# Patient Record
Sex: Male | Born: 1945
Health system: Southern US, Community
[De-identification: ages and names within clinical notes are randomized; demographics above are authoritative.]

## PROBLEM LIST (undated history)

## (undated) DIAGNOSIS — E669 Obesity, unspecified: Secondary | ICD-10-CM

## (undated) DIAGNOSIS — H2513 Age-related nuclear cataract, bilateral: Secondary | ICD-10-CM

## (undated) DIAGNOSIS — H25013 Cortical age-related cataract, bilateral: Secondary | ICD-10-CM

## (undated) DIAGNOSIS — E291 Testicular hypofunction: Secondary | ICD-10-CM

## (undated) DIAGNOSIS — M818 Other osteoporosis without current pathological fracture: Secondary | ICD-10-CM

## (undated) DIAGNOSIS — H52203 Unspecified astigmatism, bilateral: Secondary | ICD-10-CM

## (undated) DIAGNOSIS — G4730 Sleep apnea, unspecified: Secondary | ICD-10-CM

## (undated) DIAGNOSIS — Z87442 Personal history of urinary calculi: Secondary | ICD-10-CM

## (undated) DIAGNOSIS — C61 Malignant neoplasm of prostate: Secondary | ICD-10-CM

## (undated) DIAGNOSIS — I2510 Atherosclerotic heart disease of native coronary artery without angina pectoris: Secondary | ICD-10-CM

## (undated) DIAGNOSIS — IMO0001 Reserved for inherently not codable concepts without codable children: Secondary | ICD-10-CM

## (undated) DIAGNOSIS — C679 Malignant neoplasm of bladder, unspecified: Secondary | ICD-10-CM

## (undated) DIAGNOSIS — R5383 Other fatigue: Secondary | ICD-10-CM

## (undated) DIAGNOSIS — I259 Chronic ischemic heart disease, unspecified: Secondary | ICD-10-CM

## (undated) DIAGNOSIS — E7800 Pure hypercholesterolemia, unspecified: Secondary | ICD-10-CM

## (undated) DIAGNOSIS — E559 Vitamin D deficiency, unspecified: Secondary | ICD-10-CM

## (undated) DIAGNOSIS — J40 Bronchitis, not specified as acute or chronic: Secondary | ICD-10-CM

## (undated) DIAGNOSIS — T50905A Adverse effect of unspecified drugs, medicaments and biological substances, initial encounter: Secondary | ICD-10-CM

## (undated) DIAGNOSIS — E119 Type 2 diabetes mellitus without complications: Secondary | ICD-10-CM

## (undated) DIAGNOSIS — K760 Fatty (change of) liver, not elsewhere classified: Secondary | ICD-10-CM

## (undated) DIAGNOSIS — E785 Hyperlipidemia, unspecified: Secondary | ICD-10-CM

## (undated) HISTORY — DX: Cortical age-related cataract, bilateral: H25.013

## (undated) HISTORY — DX: Other osteoporosis without current pathological fracture: M81.8

## (undated) HISTORY — DX: Other fatigue: R53.83

## (undated) HISTORY — DX: Chronic ischemic heart disease, unspecified: I25.9

## (undated) HISTORY — DX: Reserved for inherently not codable concepts without codable children: IMO0001

## (undated) HISTORY — DX: Fatty (change of) liver, not elsewhere classified: K76.0

## (undated) HISTORY — DX: Obesity, unspecified: E66.9

## (undated) HISTORY — DX: Bronchitis, not specified as acute or chronic: J40

## (undated) HISTORY — PX: OTHER SURGICAL HISTORY: SHX169

## (undated) HISTORY — DX: Age-related nuclear cataract, bilateral: H25.13

## (undated) HISTORY — DX: Hyperlipidemia, unspecified: E78.5

## (undated) HISTORY — DX: Sleep apnea, unspecified: G47.30

## (undated) HISTORY — DX: Testicular hypofunction: E29.1

## (undated) HISTORY — DX: Unspecified astigmatism, bilateral: H52.203

## (undated) HISTORY — DX: Pure hypercholesterolemia, unspecified: E78.00

## (undated) HISTORY — DX: Malignant neoplasm of prostate: C61

## (undated) HISTORY — DX: Adverse effect of unspecified drugs, medicaments and biological substances, initial encounter: T50.905A

## (undated) HISTORY — DX: Vitamin D deficiency, unspecified: E55.9

---

## 1999-04-01 HISTORY — PX: CARDIAC CATHETERIZATION: SHX172

## 1999-04-14 ENCOUNTER — Inpatient Hospital Stay (HOSPITAL_COMMUNITY): Admission: EM | Admit: 1999-04-14 | Discharge: 1999-04-16 | Payer: Self-pay | Admitting: Emergency Medicine

## 2000-08-21 ENCOUNTER — Encounter: Payer: Self-pay | Admitting: Cardiology

## 2000-08-21 ENCOUNTER — Observation Stay (HOSPITAL_COMMUNITY): Admission: AD | Admit: 2000-08-21 | Discharge: 2000-08-22 | Payer: Self-pay | Admitting: Cardiology

## 2003-12-05 ENCOUNTER — Emergency Department (HOSPITAL_COMMUNITY): Admission: EM | Admit: 2003-12-05 | Discharge: 2003-12-05 | Payer: Self-pay | Admitting: Emergency Medicine

## 2003-12-07 ENCOUNTER — Encounter: Admission: RE | Admit: 2003-12-07 | Discharge: 2003-12-07 | Payer: Self-pay | Admitting: Internal Medicine

## 2004-10-27 ENCOUNTER — Ambulatory Visit (HOSPITAL_COMMUNITY): Admission: RE | Admit: 2004-10-27 | Discharge: 2004-10-27 | Payer: Self-pay | Admitting: Gastroenterology

## 2004-10-27 LAB — HM COLONOSCOPY: HM Colonoscopy: NORMAL

## 2006-11-26 ENCOUNTER — Ambulatory Visit: Payer: Self-pay | Admitting: Family Medicine

## 2006-11-28 ENCOUNTER — Ambulatory Visit: Payer: Self-pay | Admitting: Family Medicine

## 2006-12-03 ENCOUNTER — Ambulatory Visit: Payer: Self-pay | Admitting: Family Medicine

## 2007-01-07 ENCOUNTER — Ambulatory Visit: Payer: Self-pay | Admitting: Family Medicine

## 2007-02-20 ENCOUNTER — Ambulatory Visit: Payer: Self-pay | Admitting: Family Medicine

## 2007-03-27 ENCOUNTER — Ambulatory Visit: Payer: Self-pay | Admitting: Family Medicine

## 2007-10-01 ENCOUNTER — Ambulatory Visit: Payer: Self-pay | Admitting: Family Medicine

## 2007-10-08 ENCOUNTER — Ambulatory Visit: Payer: Self-pay | Admitting: Family Medicine

## 2007-10-30 ENCOUNTER — Ambulatory Visit (HOSPITAL_COMMUNITY): Admission: RE | Admit: 2007-10-30 | Discharge: 2007-10-30 | Payer: Self-pay | Admitting: Urology

## 2007-11-11 ENCOUNTER — Ambulatory Visit: Admission: RE | Admit: 2007-11-11 | Discharge: 2007-12-31 | Payer: Self-pay | Admitting: Radiation Oncology

## 2008-01-01 ENCOUNTER — Ambulatory Visit: Admission: RE | Admit: 2008-01-01 | Discharge: 2008-03-04 | Payer: Self-pay | Admitting: Radiation Oncology

## 2008-01-01 HISTORY — PX: RADIOACTIVE SEED IMPLANT: SHX5150

## 2008-02-23 ENCOUNTER — Encounter: Admission: RE | Admit: 2008-02-23 | Discharge: 2008-02-23 | Payer: Self-pay | Admitting: Urology

## 2008-02-23 ENCOUNTER — Ambulatory Visit: Admission: RE | Admit: 2008-02-23 | Discharge: 2008-02-23 | Payer: Self-pay | Admitting: Radiation Oncology

## 2008-03-29 ENCOUNTER — Ambulatory Visit (HOSPITAL_BASED_OUTPATIENT_CLINIC_OR_DEPARTMENT_OTHER): Admission: RE | Admit: 2008-03-29 | Discharge: 2008-03-29 | Payer: Self-pay | Admitting: Urology

## 2008-04-16 ENCOUNTER — Ambulatory Visit: Admission: RE | Admit: 2008-04-16 | Discharge: 2008-05-13 | Payer: Self-pay | Admitting: Radiation Oncology

## 2008-05-04 ENCOUNTER — Ambulatory Visit: Payer: Self-pay | Admitting: Family Medicine

## 2008-06-07 ENCOUNTER — Ambulatory Visit: Payer: Self-pay | Admitting: Family Medicine

## 2008-06-11 ENCOUNTER — Encounter: Admission: RE | Admit: 2008-06-11 | Discharge: 2008-06-11 | Payer: Self-pay | Admitting: Family Medicine

## 2008-06-16 ENCOUNTER — Ambulatory Visit: Payer: Self-pay | Admitting: Family Medicine

## 2009-02-21 ENCOUNTER — Ambulatory Visit: Payer: Self-pay | Admitting: Family Medicine

## 2009-02-22 ENCOUNTER — Ambulatory Visit: Payer: Self-pay | Admitting: Family Medicine

## 2009-06-22 ENCOUNTER — Ambulatory Visit: Payer: Self-pay | Admitting: Family Medicine

## 2009-06-28 ENCOUNTER — Encounter: Admission: RE | Admit: 2009-06-28 | Discharge: 2009-06-29 | Payer: Self-pay | Admitting: Family Medicine

## 2009-06-28 ENCOUNTER — Encounter: Admission: RE | Admit: 2009-06-28 | Discharge: 2009-09-26 | Payer: Self-pay | Admitting: Family Medicine

## 2010-01-11 ENCOUNTER — Ambulatory Visit: Payer: Self-pay | Admitting: Family Medicine

## 2010-02-28 DIAGNOSIS — IMO0001 Reserved for inherently not codable concepts without codable children: Secondary | ICD-10-CM

## 2010-02-28 HISTORY — DX: Reserved for inherently not codable concepts without codable children: IMO0001

## 2010-03-20 ENCOUNTER — Ambulatory Visit: Payer: Self-pay | Admitting: Family Medicine

## 2010-09-19 ENCOUNTER — Ambulatory Visit: Payer: Self-pay | Admitting: Cardiology

## 2010-09-21 ENCOUNTER — Encounter: Admission: RE | Admit: 2010-09-21 | Discharge: 2010-09-21 | Payer: Self-pay | Admitting: Cardiology

## 2010-09-27 ENCOUNTER — Ambulatory Visit: Payer: Self-pay | Admitting: Cardiology

## 2010-10-16 ENCOUNTER — Ambulatory Visit: Payer: Self-pay | Admitting: Family Medicine

## 2010-11-08 ENCOUNTER — Ambulatory Visit: Payer: Self-pay | Admitting: Cardiovascular Disease

## 2010-11-27 ENCOUNTER — Ambulatory Visit: Payer: Self-pay | Admitting: Family Medicine

## 2010-12-13 ENCOUNTER — Ambulatory Visit: Payer: Self-pay | Admitting: Cardiology

## 2011-01-11 ENCOUNTER — Ambulatory Visit
Admission: RE | Admit: 2011-01-11 | Discharge: 2011-01-11 | Payer: Self-pay | Source: Home / Self Care | Attending: Family Medicine | Admitting: Family Medicine

## 2011-03-09 ENCOUNTER — Ambulatory Visit (INDEPENDENT_AMBULATORY_CARE_PROVIDER_SITE_OTHER): Payer: BC Managed Care – PPO | Admitting: Nurse Practitioner

## 2011-03-09 DIAGNOSIS — I2510 Atherosclerotic heart disease of native coronary artery without angina pectoris: Secondary | ICD-10-CM

## 2011-03-09 DIAGNOSIS — E669 Obesity, unspecified: Secondary | ICD-10-CM

## 2011-03-13 ENCOUNTER — Other Ambulatory Visit (INDEPENDENT_AMBULATORY_CARE_PROVIDER_SITE_OTHER): Payer: BC Managed Care – PPO

## 2011-03-13 DIAGNOSIS — E785 Hyperlipidemia, unspecified: Secondary | ICD-10-CM

## 2011-05-15 NOTE — Op Note (Signed)
[name], [name]              ACCOUNT NO.:  [id number]   MEDICAL RECORD NO.:  [id number]          PATIENT TYPE:  AMB   LOCATION:  [facility]                         FACILITY:  [facility]   PHYSICIAN:  Heloise Purpura, MD      DATE OF BIRTH:  April 24, 1946   DATE OF PROCEDURE:  03/29/2008  DATE OF DISCHARGE:                               OPERATIVE REPORT   PREOPERATIVE DIAGNOSIS:  Prostate cancer.   POSTOPERATIVE DIAGNOSIS:  Prostate cancer.   PROCEDURES:  1. Transperineal placement of radiation seeds into the prostate.  2. Cystoscopy.   SURGEONS:  Dr. Heloise Purpura and Dr. Margaretmary Dys.   ANESTHESIA:  General.   COMPLICATIONS:  None.   ESTIMATED BLOOD LOSS:  Minimal.   INDICATION:  Mr. Jefferys is a 65 year old gentleman with a high risk  clinically localized prostate cancer.  He underwent metastatic  evaluation which was negative.  After discussion regarding management  options for treatment, he elected to proceed with a combination of  radiation therapy and androgen deprivation.  He has completed five weeks  of external beam radiation therapy and is now set to undergo radiation  seed implantation to the prostate for a prostate boost.  Potential  risks, complications, and alternative options were discussed with the  patient and he agreed to proceed to the above procedures.  Informed  consent was obtained.   DESCRIPTION OF PROCEDURE:  The patient was taken to the operating room  and general anesthetic was administered.  He was given preoperative  antibiotics, placed in the dorsal lithotomy position, and his perineum  was prepped and draped in the usual sterile fashion.  A Foley catheter  and rectal catheter were then placed.  Under transrectal ultrasound  guidance, the prostate was visualized.  He intraoperative planning was  performed with the Nucletron system per Dr. Kathrynn Running.  Once planning was  complete, a total of 45 seeds were placed using 17 needles for placement  of iodine  125 seeds according to the plan.  All seeds were placed with  the aid of the Nucletron system and were free seeds.  A treatment dose  was 110 gray.  Once the seeds were appropriately placed, the patient's  genitalia was then prepped and draped in the usual sterile fashion and  flexible cystourethroscopy was performed.  This demonstrated no seeds  within the urethra.  The bladder was noted to be mildly trabeculated  with multiple small  diverticula.  There were no seeds noted within the bladder.  The  cystoscope was then removed and a 16-French Foley catheter was then  replaced.  The patient appeared to tolerate procedure well without  complications.  He was able to be awakened and transferred to recovery  unit in satisfactory condition.      Heloise Purpura, MD  Electronically Signed     LB/MEDQ  D:  03/29/2008  T:  03/30/2008  Job:  [id number]

## 2011-05-18 NOTE — H&P (Signed)
[place]. [facility]  Patient:    Jimmy [name], Jimmy [name]                       MRN: [id number] Adm. Date:  08/21/00 Attending:  Colleen Can. Deborah Chalk, M.D. Dictator:   Jennet Maduro. Earl Gala, R.N., A.N.P. CC:         Thora Lance, M.D.   History and Physical  CHIEF COMPLAINT:  Chest pain.  HISTORY OF PRESENT ILLNESS:  Jimmy [name] is a pleasant 65 year old white male who presents to our office today after having had two distinct episodes of chest discomfort, which basically occurred at rest.  It has been responsive to nitroglycerin.  He notes that he had a complete physical examination yesterday with his primary care Shabree Tebbetts, which was unremarkable.  Prior to that time he had been doing quite well, with no complaints.  Today while traveling to [place], he had an episode of feeling pale and clammy with midsternal chest discomfort.  It lasted for approximately 15-20 minutes.  He took nitroglycerin x 1, with relief.  While coming back, he had a recurrence that was also associated with some palpitations and heart racing.  He took nitroglycerin x 2, with subsequent relief, and was subsequently brought here for further evaluation.  PAST MEDICAL HISTORY: 1. Prior history of chest pain.  He is status post cardiac catheterization in    April 2000, which showed normal left ventricular function with mild    coronary atherosclerosis, but with a small delay in contrast focally in    the right coronary artery.  It was most suggestive of a dissection plane.    He was treated medically at that time.  He has had treadmill testing    performed in June 2001, in which he exercised for a total of 10 minutes,    to a maximum of 4.2 miles per hour at 16% grade.  His blood pressure    response was adequate.  The test was clinically negative, as well as    electrographically negative. 2. Prior tobacco abuse. 3. Hypercholesterolemia. 4. Obesity.  ALLERGIES:  Questionable rash with  PLAVIX.  CURRENT MEDICATIONS: 1. Niaspan 500 mg q.d. 2. Zocor 40 mg q.d. 3. Aspirin q.d.  FAMILY HISTORY:  Father is living in his late 48s.  There is a history of chronic obstructive pulmonary disease and diabetes mellitus.  Mother died when the patient was 23 years old, secondary to a motor vehicle accident.  SOCIAL HISTORY:  He is married, with two children.  He previously smoked one pack a day.  He engages in occasional alcohol use.  REVIEW OF SYSTEMS:  Basically as noted above.  Otherwise unremarkable.  PHYSICAL EXAMINATION:  GENERAL:  He is currently in no acute distress.  He is pain-free.  VITAL SIGNS:  Blood pressure 120/70, heart rate 76, respirations 18.  SKIN:  Warm and dry.  Color unremarkable.  NECK:  Supple, no jugular venous distention.  LUNGS:  Basically clear.  CARDIAC:  A regular rhythm.  ABDOMEN:  Soft, positive bowel sounds, nontender.  It is obese.  EXTREMITIES:  Somewhat fleshy, but no overt edema.  LABORATORY DATA:  Currently pending.  His 12-lead electrocardiogram here in the office shows no acute changes.  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  PLAN:  He will be admitted.  Will follow his labs.  Will add topical nitrates and Lovenox.  DD:  08/21/00 TD:  08/21/00 Job: [id number]

## 2011-05-18 NOTE — Discharge Summary (Signed)
[place]. [facility]  Patient:    Jimmy [name], Jimmy [name]                     MRN: [id number] Adm. Date:  [id number] Disc. Date: [id number] Attending:  Eleanora Neighbor Dictator:   Jennet Maduro. Earl Gala, R.N., A.N.P. CC:         Thora Lance, M.D.   Discharge Summary  PRIMARY DISCHARGE DIAGNOSIS:  Two episodes of chest discomfort, with subsequent negative cardiac enzymes and negative ECG.  SECONDARY DIAGNOSES: 1. Previous episode of chest pain in April of 2000 with cardiac    catheterization revealing probable dissection of the right coronary artery.    He was treated medically. 2. Prior tobacco abuse. 3. Hypercholesterolemia, currently on Niaspan and Zocor. 4. Obesity.  HISTORY OF PRESENT ILLNESS:  Jimmy [name] is a 65 year old white male, who presented to our office on August 21, 2000, after having two episodes of chest discomfort that basically occurred at rest.  They were responsive to nitroglycerin.  They were associated with pallor, diaphoresis.  He was subsequently referred to the office where he was sent to the hospital for further evaluation.  Please see the dictated history and physical for further patient presentation and profile.  LABORATORY DATA ON ADMISSION:  His 12-lead electrocardiogram was normal showing normal sinus rhythm with no ST or T wave changes.  Hematocrit was 40, white count 9, platelets 176.  Chemistries showed sodium 140, potassium 4.0, chloride 105, CO2 24, BUN 19, creatinine 0.9, glucose of 87.  Troponin I was negative x 2.  CK-MB was negative x 2.  Chest x-ray showed questionable acute versus chronic bronchitis.  HOSPITAL COURSE:  The patient was admitted.  He was placed on subcutaneous Lovenox and Nitrol paste.  He ruled out negative for myocardial infarction per serial enzymes.  He remained pain-free throughout the remainder of his hospitalization.  Today on August 22, 2000, he continues to do well.  He has had no  recurrence of symptoms.  A 12-lead electrocardiogram remains normal.  He is felt to be stable for discharge today with further evaluation on an outpatient basis.  DISCHARGE CONDITION:  Stable.  DISCHARGE MEDICATIONS:  He will resume his home medicines which include: 1. Aspirin daily. 2. Zocor 40 mg daily. 3. Niaspan 500 mg daily. 4. Will continue Nitrol paste one inch q.8h. 5. Also place the patient on Protonix 40 mg a day.  ACTIVITY:  To be light until seen back in followup.  DIET:  Low fat, low cholesterol.  FOLLOWUP:  Followup appointment will be scheduled in our office for stress Cardiolite.  Our office will make arrangements for that test later on today and will alert the patient accordingly.  He is to call if any problems were to rise in the interim. DD:  08/22/00 TD:  08/23/00 Job: [id number]

## 2011-07-05 ENCOUNTER — Encounter: Payer: Self-pay | Admitting: Family Medicine

## 2011-08-10 ENCOUNTER — Other Ambulatory Visit: Payer: Self-pay | Admitting: *Deleted

## 2011-08-10 DIAGNOSIS — E7800 Pure hypercholesterolemia, unspecified: Secondary | ICD-10-CM

## 2011-08-20 ENCOUNTER — Encounter: Payer: Self-pay | Admitting: Nurse Practitioner

## 2011-08-28 ENCOUNTER — Encounter: Payer: Self-pay | Admitting: Family Medicine

## 2011-08-29 ENCOUNTER — Encounter: Payer: Self-pay | Admitting: Family Medicine

## 2011-08-29 ENCOUNTER — Ambulatory Visit (INDEPENDENT_AMBULATORY_CARE_PROVIDER_SITE_OTHER): Payer: Medicare Other | Admitting: Family Medicine

## 2011-08-29 VITALS — BP 130/70 | HR 67 | Ht 72.0 in | Wt 275.0 lb

## 2011-08-29 DIAGNOSIS — E785 Hyperlipidemia, unspecified: Secondary | ICD-10-CM

## 2011-08-29 DIAGNOSIS — G4730 Sleep apnea, unspecified: Secondary | ICD-10-CM

## 2011-08-29 DIAGNOSIS — Z79899 Other long term (current) drug therapy: Secondary | ICD-10-CM

## 2011-08-29 DIAGNOSIS — I152 Hypertension secondary to endocrine disorders: Secondary | ICD-10-CM | POA: Insufficient documentation

## 2011-08-29 DIAGNOSIS — I2510 Atherosclerotic heart disease of native coronary artery without angina pectoris: Secondary | ICD-10-CM

## 2011-08-29 DIAGNOSIS — I10 Essential (primary) hypertension: Secondary | ICD-10-CM

## 2011-08-29 DIAGNOSIS — E1169 Type 2 diabetes mellitus with other specified complication: Secondary | ICD-10-CM | POA: Insufficient documentation

## 2011-08-29 DIAGNOSIS — Z23 Encounter for immunization: Secondary | ICD-10-CM

## 2011-08-29 DIAGNOSIS — Z8546 Personal history of malignant neoplasm of prostate: Secondary | ICD-10-CM | POA: Insufficient documentation

## 2011-08-29 LAB — LIPID PANEL
Cholesterol: 224 mg/dL — ABNORMAL HIGH (ref 0–200)
HDL: 37 mg/dL — ABNORMAL LOW (ref >39)
LDL Cholesterol: 109 mg/dL — ABNORMAL HIGH (ref 0–99)
Total CHOL/HDL Ratio: 6.1 Ratio
Triglycerides: 392 mg/dL — ABNORMAL HIGH (ref <150)
VLDL: 78 mg/dL — ABNORMAL HIGH (ref 0–40)

## 2011-08-29 LAB — CBC WITH DIFFERENTIAL/PLATELET
Basophils Absolute: 0 10*3/uL (ref 0.0–0.1)
Basophils Relative: 0 % (ref 0–1)
Eosinophils Absolute: 0.1 10*3/uL (ref 0.0–0.7)
Eosinophils Relative: 2 % (ref 0–5)
HCT: 43.6 % (ref 39.0–52.0)
Hemoglobin: 14.4 g/dL (ref 13.0–17.0)
Lymphocytes Relative: 21 % (ref 12–46)
Lymphs Abs: 1 10*3/uL (ref 0.7–4.0)
MCH: 31 pg (ref 26.0–34.0)
MCHC: 33 g/dL (ref 30.0–36.0)
MCV: 93.8 fL (ref 78.0–100.0)
Monocytes Absolute: 0.4 10*3/uL (ref 0.1–1.0)
Monocytes Relative: 8 % (ref 3–12)
Neutro Abs: 3.2 10*3/uL (ref 1.7–7.7)
Neutrophils Relative %: 69 % (ref 43–77)
Platelets: 173 10*3/uL (ref 150–400)
RBC: 4.65 MIL/uL (ref 4.22–5.81)
RDW: 14.6 % (ref 11.5–15.5)
WBC: 4.6 10*3/uL (ref 4.0–10.5)

## 2011-08-29 LAB — COMPREHENSIVE METABOLIC PANEL
ALT: 65 U/L — ABNORMAL HIGH (ref 0–53)
AST: 49 U/L — ABNORMAL HIGH (ref 0–37)
Albumin: 4.4 g/dL (ref 3.5–5.2)
Alkaline Phosphatase: 31 U/L — ABNORMAL LOW (ref 39–117)
BUN: 21 mg/dL (ref 6–23)
CO2: 21 mEq/L (ref 19–32)
Calcium: 9.8 mg/dL (ref 8.4–10.5)
Chloride: 105 mEq/L (ref 96–112)
Creat: 0.94 mg/dL (ref 0.50–1.35)
Glucose, Bld: 110 mg/dL — ABNORMAL HIGH (ref 70–99)
Potassium: 4.1 mEq/L (ref 3.5–5.3)
Sodium: 139 mEq/L (ref 135–145)
Total Bilirubin: 0.3 mg/dL (ref 0.3–1.2)
Total Protein: 7 g/dL (ref 6.0–8.3)

## 2011-08-29 NOTE — Progress Notes (Signed)
  Subjective:    Patient ID: Jimmy [name], male    DOB: 1946-04-04, 65 y.o.   MRN: [id number]  HPI He is here for complete examination. He is scheduled to see his cardiologist in one week. He does complain of some shortness of breath but states it takes 15 minutes before him to feel this way. He continues to work with [name] concerning his weight and apparently has made some changes resulting in a several pound weight loss. He is followed by his urologist. He is on Prolia treatment of his underlying osteoporosis. He does have sleep apnea and continues on CPAP. He also continues on his blood pressure medications. His immunization record was reviewed  Review of Systems  Constitutional: Negative.   HENT: Negative.   Eyes: Negative.   Respiratory: Positive for shortness of breath.   Cardiovascular: Negative.   Gastrointestinal: Negative.   Genitourinary: Negative.   Musculoskeletal: Negative.   Skin: Negative.   Neurological: Negative.   Hematological: Negative.   Psychiatric/Behavioral: Negative.        Objective:   Physical Exam BP 130/70  Pulse 67  Ht 6' (1.829 m)  Wt 275 lb (124.739 kg)  BMI 37.30 kg/m2  General Appearance:    Alert, cooperative, no distress, appears stated age  Head:    Normocephalic, without obvious abnormality, atraumatic  Eyes:    PERRL, conjunctiva/corneas clear, EOM's intact, fundi    benign  Ears:    Normal TM's and external ear canals  Nose:   Nares normal, mucosa normal, no drainage or sinus   tenderness  Throat:   Lips, mucosa, and tongue normal; teeth and gums normal  Neck:   Supple, no lymphadenopathy;  thyroid:  no   enlargement/tenderness/nodules; no carotid   bruit or JVD  Back:    Spine nontender, no curvature, ROM normal, no CVA     tenderness  Lungs:     Clear to auscultation bilaterally without wheezes, rales or     ronchi; respirations unlabored  Chest Wall:    No tenderness or deformity   Heart:    Regular rate and rhythm,  S1 and S2 normal, no murmur, rub   or gallop  Breast Exam:    No chest wall tenderness, masses or gynecomastia  Abdomen:     Soft, non-tender, nondistended, normoactive bowel sounds,    no masses, no hepatosplenomegaly        Extremities:   No clubbing, cyanosis or edema  Pulses:   2+ and symmetric all extremities  Skin:   Skin color, texture, turgor normal, no rashes or lesions  Lymph nodes:   Cervical, supraclavicular, and axillary nodes normal  Neurologic:   CNII-XII intact, normal strength, sensation and gait; reflexes 2+ and symmetric throughout          Psych:   Normal mood, affect, hygiene and grooming.           Assessment & Plan:  ASHD. Hyperlipidemia. Morbid obesity. Hypogonadism. Prostate cancer. Sleep apnea. Hypertension. Continue to work with your dietitian. Routine blood screening.

## 2011-08-29 NOTE — Patient Instructions (Signed)
Keep working with Jimmy [name] on your weight loss. Have fundi [facility]

## 2011-08-30 ENCOUNTER — Telehealth: Payer: Self-pay

## 2011-08-30 MED ORDER — PITAVASTATIN CALCIUM 4 MG PO TABS: 1.0000 | ORAL_TABLET | Freq: Every day | ORAL | Status: DC

## 2011-08-30 NOTE — Telephone Encounter (Signed)
Gave pt samples as per [employer]

## 2011-08-30 NOTE — Progress Notes (Signed)
Gave pt 28 pills of livalo 4mg 

## 2011-09-05 ENCOUNTER — Encounter: Payer: Self-pay | Admitting: Nurse Practitioner

## 2011-09-05 ENCOUNTER — Ambulatory Visit (INDEPENDENT_AMBULATORY_CARE_PROVIDER_SITE_OTHER): Payer: Medicare Other | Admitting: Nurse Practitioner

## 2011-09-05 ENCOUNTER — Other Ambulatory Visit: Payer: Medicare Other | Admitting: *Deleted

## 2011-09-05 VITALS — BP 118/80 | HR 78 | Ht 68.0 in | Wt 276.8 lb

## 2011-09-05 DIAGNOSIS — E785 Hyperlipidemia, unspecified: Secondary | ICD-10-CM

## 2011-09-05 DIAGNOSIS — E669 Obesity, unspecified: Secondary | ICD-10-CM

## 2011-09-05 DIAGNOSIS — I2510 Atherosclerotic heart disease of native coronary artery without angina pectoris: Secondary | ICD-10-CM

## 2011-09-05 NOTE — Assessment & Plan Note (Signed)
He had chest pain back in 2000 and had cath at that time. He had nonobstructive CAD yet was felt to have had a probable dissection of the RCA. Last stress test was in 2011. He is asymptomatic. We discussed the need for aggressive risk factor modification. He will be seen back in 6 months. No change in his medicines. Labs have been done by his PCP. Dr. Shirlee Latch will assume his cardiology care. Patient is agreeable to this plan and will call if any problems develop in the interim.

## 2011-09-05 NOTE — Progress Notes (Signed)
Angelia Mould Date of Birth: 08-23-1946   History of Present Illness: Mr. Barreira is seen back today for his 6 month check. He is seen for Dr. Shirlee Latch. He is a former patient of Dr. Ronnald Nian. He is doing well with no cardiac complaints. He has retired. He had lost down to 264 pounds but is back up some. He is trying to do better in regards to exercise. No chest pain or shortness of breath. He has had recent labs with his PCP. He is tolerating his medicines.  Current Outpatient Prescriptions on File Prior to Visit  Medication Sig Dispense Refill  . ASPIRIN PO Take 81 mg by mouth daily.       . calcium citrate-vitamin D (CITRACAL+D) 315-200 MG-UNIT per tablet Take 4 tablets by mouth daily.        . cholecalciferol (VITAMIN D) 1000 UNITS tablet Take 2,000 Units by mouth daily.       . fenofibrate (TRICOR) 145 MG tablet Take 145 mg by mouth daily.        . hydrochlorothiazide 25 MG tablet Take 25 mg by mouth daily.        . Magnesium 250 MG TABS Take by mouth.        . Multiple Vitamins-Minerals (MULTIVITAMIN WITH MINERALS) tablet Take 1 tablet by mouth daily.        [name] omega-3 acid ethyl esters (LOVAZA) 1 G capsule Take 1 g by mouth 4 (four) times daily.        . Pitavastatin Calcium (LIVALO) 4 MG TABS Take 1 tablet (4 mg total) by mouth daily.  28 tablet  samples    Allergies  Allergen Reactions  . Crestor (Rosuvastatin Calcium)   . Lipitor (Atorvastatin Calcium) Other (See Comments)    MUSCLE PAINS   . Plavix (Clopidogrel Bisulfate) Hives    Past Medical History  Diagnosis Date  . Vitamin D insufficiency   . Fatigue   . Bronchitis   . Dyslipidemia   . Obesity   . Sleep apnea   . Hypogonadism male   . Ischemic heart disease     s/p cath in 2000 showing nonobstructive CAD yet felt to have had a probable dissection of the RCA  . Prostate cancer     s/p seed implant and radiation  . Hypercholesteremia   . Normal nuclear stress test March 2011  . Fatty liver     Past  Surgical History  Procedure Date  . Radioactive seed implant 2009    Implanted radiation seeds  . Cardiac catheterization April 2000    History  Smoking status  . Former Smoker  . Quit date: 08/20/1999  Smokeless tobacco  . Not on file    History  Alcohol Use  . Yes    Family History  Problem Relation Age of Onset  . COPD Father     Review of Systems: The review of systems is positive for some joint pain. No cardiac complaints.  All other systems were reviewed and are negative.  Physical Exam: BP 118/80  Pulse 78  Ht 5\' 8"  (1.727 m)  Wt 276 lb 12.8 oz (125.556 kg)  BMI 42.09 kg/m2 Patient is very pleasant and in no acute distress. He is morbidly obese. Skin is warm and dry. Color is normal.  HEENT is unremarkable. Normocephalic/atraumatic. PERRL. Sclera are nonicteric. Neck is supple. No masses. No JVD. Lungs are clear. Cardiac exam shows a regular rate and rhythm. Abdomen is soft. Extremities are full but  without edema. Gait and ROM are intact. No gross neurologic deficits noted.   LABORATORY DATA:   Assessment / Plan:

## 2011-09-05 NOTE — Patient Instructions (Signed)
Stay on your current medicines Regular exercise for 45 minutes each day is recommended. Heart Healthy Diet is recommended. Continue to work on your weight. We will see you back in 6 months. You will see Dr. Marca Ancona in 6 months for your cardiology care.

## 2011-09-10 NOTE — Progress Notes (Signed)
Agree with note.  Jimmy [name]  

## 2011-09-24 LAB — COMPREHENSIVE METABOLIC PANEL
ALT: 95 — ABNORMAL HIGH
AST: 107 — ABNORMAL HIGH
Albumin: 3.8
Alkaline Phosphatase: 71
BUN: 13
CO2: 26
Calcium: 9.3
Chloride: 105
Creatinine, Ser: 0.8
GFR calc Af Amer: >60
GFR calc non Af Amer: >60
Glucose, Bld: 110 — ABNORMAL HIGH
Potassium: 4.1
Sodium: 140
Total Bilirubin: 0.7
Total Protein: 6.9

## 2011-09-24 LAB — CBC
HCT: 40.4
Hemoglobin: 13.8
MCHC: 34
MCV: 90.5
Platelets: 150
RBC: 4.47
RDW: 14.9
WBC: 5.7

## 2011-09-24 LAB — PROTIME-INR
INR: 1
Prothrombin Time: 12.9

## 2011-09-24 LAB — APTT: aPTT: 29

## 2011-10-01 ENCOUNTER — Other Ambulatory Visit: Payer: Self-pay | Admitting: Nurse Practitioner

## 2011-10-01 MED ORDER — HYDROCHLOROTHIAZIDE 25 MG PO TABS: 25.0000 mg | ORAL_TABLET | Freq: Every day | ORAL | Status: DC

## 2011-10-02 ENCOUNTER — Telehealth: Payer: Self-pay | Admitting: Family Medicine

## 2011-10-02 NOTE — Telephone Encounter (Signed)
Pt seems to be tolerating Livalo needs rx sent to [facility]  Discount card does not apply to him since he is Medicare but thanks anyway

## 2011-10-03 MED ORDER — PITAVASTATIN CALCIUM 4 MG PO TABS: 1.0000 | ORAL_TABLET | Freq: Every day | ORAL | Status: DC

## 2011-10-03 NOTE — Telephone Encounter (Signed)
Livalo prescription has been called in.

## 2011-10-03 NOTE — Telephone Encounter (Signed)
Let him know that I call the medication in and he will need blood work done in 2 months

## 2011-10-03 NOTE — Telephone Encounter (Signed)
Called pt and he was informed

## 2011-11-05 ENCOUNTER — Ambulatory Visit (INDEPENDENT_AMBULATORY_CARE_PROVIDER_SITE_OTHER): Payer: Medicare Other | Admitting: Family Medicine

## 2011-11-05 ENCOUNTER — Encounter: Payer: Self-pay | Admitting: Family Medicine

## 2011-11-05 VITALS — BP 120/70 | HR 74 | Wt 275.0 lb

## 2011-11-05 DIAGNOSIS — B079 Viral wart, unspecified: Secondary | ICD-10-CM

## 2011-11-05 NOTE — Progress Notes (Signed)
  Subjective:    Patient ID: Jimmy [name], male    DOB: [birth year], 65 y.o.   MRN: [id number]  HPI He is here for removal of a lesion in the corner of the left eye E. daily.   Review of Systems     Objective:   Physical Exam Pedunculated wart noted in this area.       Assessment & Plan:  Wart. The area was injected with Xylocaine and epinephrine. It was excised without difficulty and the base hyfrecated with silver nitrate.

## 2011-12-03 ENCOUNTER — Other Ambulatory Visit: Payer: Self-pay | Admitting: Cardiology

## 2011-12-27 ENCOUNTER — Telehealth: Payer: Self-pay | Admitting: Nurse Practitioner

## 2011-12-27 NOTE — Telephone Encounter (Signed)
NA

## 2011-12-27 NOTE — Telephone Encounter (Signed)
New Problem:    [name] called because her husband has been having issues with several medications and was placed on TRICOR 145 MG tablet and it worked well with his system.  The medication then switched to a generic form and his wife was wondering if he could stay on the Tricor because of all of the issues he's had with switching medications in the past. Please advise

## 2011-12-28 NOTE — Telephone Encounter (Signed)
I spoke with the patient's wife. She was concerned because the pharmacy dispensed generic tricor to them vs brand name. She reports that the patient has had a lot of trouble on statin therapy and been switched a lot due to muscle/ joint pain. I explained that if the patient has been doing well on brand tricor, that his risk for problems on the generic is low. I have advised that he go ahead and try the generic. If he develops a problem, we can contact the pharmacy to [facility] for brand name. The patient's wife is agreeable.

## 2012-01-15 ENCOUNTER — Telehealth: Payer: Self-pay | Admitting: Family Medicine

## 2012-01-15 NOTE — Telephone Encounter (Signed)
Pt.notified

## 2012-01-15 NOTE — Telephone Encounter (Signed)
Pt had mole removed from eyelid a few months ago and was told it was being sent out for biopsy. They have not heard any results

## 2012-01-15 NOTE — Telephone Encounter (Signed)
°  Pt had mole removed from eyelid a few months ago and was told it was being sent out for biopsy. They have not heard any results

## 2012-01-15 NOTE — Telephone Encounter (Signed)
Let him know that the lesion was a wart

## 2012-01-29 ENCOUNTER — Other Ambulatory Visit: Payer: Self-pay | Admitting: Family Medicine

## 2012-04-11 ENCOUNTER — Ambulatory Visit: Payer: Medicare Other | Admitting: Cardiology

## 2012-04-15 ENCOUNTER — Encounter: Payer: Self-pay | Admitting: Cardiology

## 2012-04-15 ENCOUNTER — Ambulatory Visit (INDEPENDENT_AMBULATORY_CARE_PROVIDER_SITE_OTHER): Payer: Medicare Other | Admitting: Cardiology

## 2012-04-15 VITALS — BP 140/80 | HR 71 | Ht 68.0 in | Wt 286.1 lb

## 2012-04-15 DIAGNOSIS — I2510 Atherosclerotic heart disease of native coronary artery without angina pectoris: Secondary | ICD-10-CM

## 2012-04-15 DIAGNOSIS — E785 Hyperlipidemia, unspecified: Secondary | ICD-10-CM

## 2012-04-15 LAB — LDL CHOLESTEROL, DIRECT: Direct LDL: 89.3 mg/dL

## 2012-04-15 LAB — HEPATIC FUNCTION PANEL
ALT: 87 U/L — ABNORMAL HIGH (ref 0–53)
AST: 80 U/L — ABNORMAL HIGH (ref 0–37)
Albumin: 4.4 g/dL (ref 3.5–5.2)
Alkaline Phosphatase: 29 U/L — ABNORMAL LOW (ref 39–117)
Bilirubin, Direct: 0.1 mg/dL (ref 0.0–0.3)
Total Bilirubin: 0.5 mg/dL (ref 0.3–1.2)
Total Protein: 7.8 g/dL (ref 6.0–8.3)

## 2012-04-15 LAB — LIPID PANEL
Cholesterol: 176 mg/dL (ref 0–200)
HDL: 41.4 mg/dL (ref >39.00)
Total CHOL/HDL Ratio: 4
Triglycerides: 312 mg/dL — ABNORMAL HIGH (ref 0.0–149.0)
VLDL: 62.4 mg/dL — ABNORMAL HIGH (ref 0.0–40.0)

## 2012-04-15 NOTE — Assessment & Plan Note (Signed)
No ischemic symptoms.  Normal myoview in 3/11.  Continue ASA 81 and statin.

## 2012-04-15 NOTE — Assessment & Plan Note (Signed)
Major problem for this patient.  He needs to work hard on diet and exercise.  We discussed portion control and eating less in the evening.

## 2012-04-15 NOTE — Patient Instructions (Signed)
Your physician recommends that you return for a FASTING lipid profile /liver profile today.  Your physician wants you to follow-up in: 1 year with Dr Shirlee Latch. (April 2014).  You will receive a reminder letter in the mail two months in advance. If you don't receive a letter, please call our office to schedule the follow-up appointment.

## 2012-04-15 NOTE — Assessment & Plan Note (Signed)
In 8/12, patient had high LDL (goal < 70) and elevated triglycerides.  Transaminases were elevated at that time.  Possible that the LFT elevation is due to NASH/fatty liver.  He has started on Livalo since that time.  He needs to have lipids/LFTs checked today.

## 2012-04-15 NOTE — Progress Notes (Signed)
PCP: Dr. Susann Givens  66 yo with history of nonobstructive CAD and possible RCA dissection in 2000 presents for cardiology evaluation.  Patient has been seen by Dr. Deborah Chalk in the past and is seen by me for the first time today.  In general, Mr Donahoe has been doing well.  No exertional chest pain.  He is short of breath if he walks fast.  He can walk at a normal pace and climb a flight of steps without problems.  He does a lot of hunting in the fall and winter.  He is retired and helps keep his grandson.  Lipids were high when last checked in 8/12, Livalo was added at that time.   Unfortunately, he continues to gain weight.  He walks 2-3 miles about 3 days a week.  He tends to eat a big supper.   ECG; NSR, 1st degree AV block (212 msec)  Labs (8/12): K 4.1, creatinine 0.94, ALT 65, AST 49, LDL 109, HDL 37, TGs 392  PMH: 1. Obesity 2. OSA: Uses CPAP 3. Hyperlipidemia: myalgias with Crestor and Lipitor. 4. Hives with Plavix 5. CAD: Cath in 2000 with nonobstructive disease and concern for possible RCA dissection.  Normal myovew in 3/ll.   6. Prostate cancer s/p seed implantation and radiation.   FH: No premature CAD  SH: Married.  Quit smoking in 2000.  Lives in [place].  2 daughters, 5 grandsons.  Retired Teaching laboratory technician at [employer].   ROS: All systems reviewed and negative except as per HPI.  Current Outpatient Prescriptions  Medication Sig Dispense Refill  . ASPIRIN PO Take 81 mg by mouth daily.       . calcium citrate-vitamin D (CITRACAL+D) 315-200 MG-UNIT per tablet Take 4 tablets by mouth daily.        . cholecalciferol (VITAMIN D) 1000 UNITS tablet Take 2,000 Units by mouth daily.       [name] denosumab (PROLIA) 60 MG/ML SOLN injection Inject 60 mg into the skin every 6 (six) months. Administer in upper arm, thigh, or abdomen      . hydrochlorothiazide (HYDRODIURIL) 25 MG tablet Take 1 tablet (25 mg total) by mouth daily.  30 tablet  11  . LOVAZA 1 G capsule TAKE 4 CAPSULE BY  MOUTH ONCE A DAY  120 capsule  6  . Magnesium 250 MG TABS Take by mouth.        . Multiple Vitamins-Minerals (MULTIVITAMIN WITH MINERALS) tablet Take 1 tablet by mouth daily.        . Pitavastatin Calcium (LIVALO) 4 MG TABS Take 1 tablet (4 mg total) by mouth daily.  30 tablet  11  . TRICOR 145 MG tablet TAKE 1 TABLET EVERY DAY  30 tablet  5    BP 140/80  Pulse 71  Ht 5\' 8"  (1.727 m)  Wt 286 lb 1.9 oz (129.783 kg)  BMI 43.50 kg/m2 General: NAD, obese Neck: Thick, no JVD, no thyromegaly or thyroid nodule.  Lungs: Clear to auscultation bilaterally with normal respiratory effort. CV: Nondisplaced PMI.  Heart regular S1/S2, no S3/S4, no murmur.  1+ edema 1/3 up lower legs bilaterally.  No carotid bruit.  Normal pedal pulses.  Abdomen: Soft, nontender, no hepatosplenomegaly, no distention.  Neurologic: Alert and oriented x 3.  Psych: Normal affect. Extremities: No clubbing or cyanosis.

## 2012-04-18 ENCOUNTER — Telehealth: Payer: Self-pay | Admitting: Cardiology

## 2012-04-18 NOTE — Telephone Encounter (Signed)
Pt aware of lab results and recommendations. He has lost 6 more pounds and [place]. Mylo Red RN

## 2012-04-18 NOTE — Telephone Encounter (Signed)
Fu call °Pt returning your call  °

## 2012-06-30 ENCOUNTER — Other Ambulatory Visit: Payer: Self-pay | Admitting: Cardiology

## 2012-08-27 ENCOUNTER — Other Ambulatory Visit: Payer: Self-pay | Admitting: Family Medicine

## 2012-09-16 ENCOUNTER — Other Ambulatory Visit: Payer: Self-pay | Admitting: Family Medicine

## 2012-09-16 ENCOUNTER — Ambulatory Visit (INDEPENDENT_AMBULATORY_CARE_PROVIDER_SITE_OTHER): Payer: Medicare Other | Admitting: Family Medicine

## 2012-09-16 ENCOUNTER — Encounter: Payer: Self-pay | Admitting: Family Medicine

## 2012-09-16 ENCOUNTER — Encounter: Payer: Self-pay | Admitting: Internal Medicine

## 2012-09-16 VITALS — BP 124/80 | HR 76 | Ht 69.0 in | Wt 284.0 lb

## 2012-09-16 DIAGNOSIS — Z8546 Personal history of malignant neoplasm of prostate: Secondary | ICD-10-CM

## 2012-09-16 DIAGNOSIS — G4730 Sleep apnea, unspecified: Secondary | ICD-10-CM

## 2012-09-16 DIAGNOSIS — I10 Essential (primary) hypertension: Secondary | ICD-10-CM

## 2012-09-16 DIAGNOSIS — E66813 Obesity, class 3: Secondary | ICD-10-CM

## 2012-09-16 DIAGNOSIS — Z23 Encounter for immunization: Secondary | ICD-10-CM

## 2012-09-16 DIAGNOSIS — M818 Other osteoporosis without current pathological fracture: Secondary | ICD-10-CM

## 2012-09-16 DIAGNOSIS — E785 Hyperlipidemia, unspecified: Secondary | ICD-10-CM

## 2012-09-16 DIAGNOSIS — T387X5A Adverse effect of androgens and anabolic congeners, initial encounter: Secondary | ICD-10-CM

## 2012-09-16 DIAGNOSIS — Z79899 Other long term (current) drug therapy: Secondary | ICD-10-CM

## 2012-09-16 DIAGNOSIS — I2510 Atherosclerotic heart disease of native coronary artery without angina pectoris: Secondary | ICD-10-CM

## 2012-09-16 LAB — CBC WITH DIFFERENTIAL/PLATELET
Basophils Absolute: 0 10*3/uL (ref 0.0–0.1)
Basophils Relative: 0 % (ref 0–1)
Eosinophils Absolute: 0.1 10*3/uL (ref 0.0–0.7)
Eosinophils Relative: 2 % (ref 0–5)
HCT: 44.1 % (ref 39.0–52.0)
Hemoglobin: 15.1 g/dL (ref 13.0–17.0)
Lymphocytes Relative: 19 % (ref 12–46)
Lymphs Abs: 0.8 10*3/uL (ref 0.7–4.0)
MCH: 31.1 pg (ref 26.0–34.0)
MCHC: 34.2 g/dL (ref 30.0–36.0)
MCV: 90.9 fL (ref 78.0–100.0)
Monocytes Absolute: 0.5 10*3/uL (ref 0.1–1.0)
Monocytes Relative: 10 % (ref 3–12)
Neutro Abs: 3.1 10*3/uL (ref 1.7–7.7)
Neutrophils Relative %: 69 % (ref 43–77)
Platelets: 167 10*3/uL (ref 150–400)
RBC: 4.85 MIL/uL (ref 4.22–5.81)
RDW: 14.2 % (ref 11.5–15.5)
WBC: 4.5 10*3/uL (ref 4.0–10.5)

## 2012-09-16 MED ORDER — INFLUENZA VIRUS VACC SPLIT PF IM SUSP: 0.5000 mL | Freq: Once | INTRAMUSCULAR | Status: DC

## 2012-09-16 NOTE — Patient Instructions (Addendum)
Walk every day if you can. If it's thundering and lightening then you don't have to go outside but then go to the mall. The next time you feel like having a second helping wait a half an hour and see if he still wants eat.

## 2012-09-16 NOTE — Progress Notes (Signed)
Subjective:    Patient ID: Jimmy [name], male    DOB: 1946/12/10, 66 y.o.   MRN: [id number]  HPI He is here for a general checkup. He does complain of intermittent difficulty with breathing. He does state that when he tips his head back and breathe through his nose it can be relieved. He cannot relate this to physical activity. He notes no heart rate changes. He sees his urologist and cardiologist regularly. He last saw his cardiologist in April. That note was reviewed. He gets shots routinely for his underlying osteoporosis secondary to androgen therapy. He continues on his CPAP and has not had a read out in several months. He makes intermittent progress and then falters in regard to his diet and exercise. He has been involved in counseling with the nutritionist. His family and social history were reviewed.   Review of Systems  Constitutional: Negative.   HENT: Negative.   Respiratory: Positive for shortness of breath.   Musculoskeletal: Negative.   Skin: Negative.   Hematological: Negative.   Psychiatric/Behavioral: Negative.        Objective:   Physical Exam BP 124/80  Pulse 76  Ht 5\' 9"  (1.753 m)  Wt 284 lb (128.822 kg)  BMI 41.94 kg/m2  SpO2 98%  General Appearance:    Alert, cooperative, no distress, appears stated age  Head:    Normocephalic, without obvious abnormality, atraumatic  Eyes:    PERRL, conjunctiva/corneas clear, EOM's intact, fundi    benign  Ears:    Normal TM's and external ear canals  Nose:   Nares normal, mucosa normal, no drainage or sinus   tenderness  Throat:   Lips, mucosa, and tongue normal; teeth and gums normal  Neck:   Supple, no lymphadenopathy;  thyroid:  no   enlargement/tenderness/nodules; no carotid   bruit or JVD  Back:    Spine nontender, no curvature, ROM normal, no CVA     tenderness  Lungs:     Clear to auscultation bilaterally without wheezes, rales or     ronchi; respirations unlabored  Chest Wall:    No tenderness or deformity   Heart:    Regular rate and rhythm, S1 and S2 normal, no murmur, rub   or gallop  Breast Exam:    No chest wall tenderness, masses or gynecomastia  Abdomen:     Soft, non-tender, nondistended, normoactive bowel sounds,    no masses, no hepatosplenomegaly  Genitalia:   deferred   Rectal:   deferred   Extremities:   No clubbing, cyanosis or edema  Pulses:   2+ and symmetric all extremities  Skin:   Skin color, texture, turgor normal, no rashes or lesions  Lymph nodes:   Cervical, supraclavicular, and axillary nodes normal  Neurologic:   CNII-XII intact, normal strength, sensation and gait; reflexes 2+ and symmetric throughout          Psych:   Normal mood, affect, hygiene and grooming.           Assessment & Plan:   1. Need for prophylactic vaccination and inoculation against influenza  influenza  inactive virus vaccine (FLUZONE/FLUARIX) injection 0.5 mL  2. Osteoporosis due to androgen therapy  Vitamin D 25 hydroxy  3. Sleep apnea    4. Obesity, Class III, BMI 40-49.9 (morbid obesity)  CBC with Differential, Comprehensive metabolic panel, Lipid panel  5. Hypertension    6. Hyperlipidemia LDL goal < 70    7. History of prostate cancer  CBC with Differential, Comprehensive  metabolic panel  8. CAD (coronary artery disease)  Comprehensive metabolic panel, Lipid panel  9. ASHD (arteriosclerotic heart disease)  CBC with Differential, Comprehensive metabolic panel, Lipid panel, US Aorta Initial Medicare Screen  10. Encounter for long-term (current) use of other medications  CBC with Differential, Comprehensive metabolic panel, Lipid panel   discussed taking good care of himself in arch to diet and exercise. Discussed making permanent lifestyle changes. Information concerning advanced directive was given. He is to check with his CPAP company and get a read out and send it to his. Flu shot was given. Discussion of his versus benefits was done. He will continue to be followed by his cardiologist  and urology.

## 2012-09-17 LAB — HEMOGLOBIN A1C
Hgb A1c MFr Bld: 7.4 % — ABNORMAL HIGH (ref <5.7)
Mean Plasma Glucose: 166 mg/dL — ABNORMAL HIGH (ref <117)

## 2012-09-17 LAB — LIPID PANEL
Cholesterol: 192 mg/dL (ref 0–200)
HDL: 35 mg/dL — ABNORMAL LOW (ref >39)
LDL Cholesterol: 84 mg/dL (ref 0–99)
Total CHOL/HDL Ratio: 5.5 Ratio
Triglycerides: 365 mg/dL — ABNORMAL HIGH (ref <150)
VLDL: 73 mg/dL — ABNORMAL HIGH (ref 0–40)

## 2012-09-17 LAB — COMPREHENSIVE METABOLIC PANEL
ALT: 66 U/L — ABNORMAL HIGH (ref 0–53)
AST: 52 U/L — ABNORMAL HIGH (ref 0–37)
Albumin: 4.8 g/dL (ref 3.5–5.2)
Alkaline Phosphatase: 33 U/L — ABNORMAL LOW (ref 39–117)
BUN: 17 mg/dL (ref 6–23)
CO2: 23 mEq/L (ref 19–32)
Calcium: 9.9 mg/dL (ref 8.4–10.5)
Chloride: 103 mEq/L (ref 96–112)
Creat: 0.88 mg/dL (ref 0.50–1.35)
Glucose, Bld: 150 mg/dL — ABNORMAL HIGH (ref 70–99)
Potassium: 4.1 mEq/L (ref 3.5–5.3)
Sodium: 137 mEq/L (ref 135–145)
Total Bilirubin: 0.4 mg/dL (ref 0.3–1.2)
Total Protein: 7.4 g/dL (ref 6.0–8.3)

## 2012-09-17 LAB — VITAMIN D 25 HYDROXY (VIT D DEFICIENCY, FRACTURES): Vit D, 25-Hydroxy: 26 ng/mL — ABNORMAL LOW (ref 30–89)

## 2012-09-18 ENCOUNTER — Ambulatory Visit
Admission: RE | Admit: 2012-09-18 | Discharge: 2012-09-18 | Disposition: A | Discharge status: Home / Self Care | Payer: Medicare Other | Source: Ambulatory Visit | Attending: Family Medicine | Admitting: Family Medicine

## 2012-09-18 DIAGNOSIS — I2510 Atherosclerotic heart disease of native coronary artery without angina pectoris: Secondary | ICD-10-CM

## 2012-09-19 ENCOUNTER — Ambulatory Visit (INDEPENDENT_AMBULATORY_CARE_PROVIDER_SITE_OTHER): Payer: Medicare Other | Admitting: Family Medicine

## 2012-09-19 ENCOUNTER — Encounter: Payer: Self-pay | Admitting: Family Medicine

## 2012-09-19 VITALS — BP 130/82 | HR 80 | Wt 282.0 lb

## 2012-09-19 DIAGNOSIS — E119 Type 2 diabetes mellitus without complications: Secondary | ICD-10-CM

## 2012-09-19 NOTE — Progress Notes (Signed)
  Subjective:    Patient ID: Jimmy [name], male    DOB: 10/04/1946, 66 y.o.   MRN: [id number]  HPI He is here for consult. Recent blood work did show an elevated blood sugar. A followup hemoglobin A1c was elevated at 7.4.   Review of Systems     Objective:   Physical Exam Alert and in no distress otherwise not examined       Assessment & Plan:   1. Diabetes mellitus, new onset   I discussed the new diagnosis of diabetes in relation to his underlying heart disease, blood pressure, cholesterol. Discussed risks of stroke, blindness,  Heart disease, renal problems as well as neurologic complications. He presently is getting nutritional counseling. We'll add diabetes counseling to this. I recommended that he go to [email or web address] or the American diabetes association website.again discussed the need for him to make major lifestyle changes in regard to diet and exercise. Recheck here in one month to answer any questions concerning his new diagnosis.

## 2012-09-19 NOTE — Patient Instructions (Signed)
Take an extra 400 international units of vitamin D daily.[email or web address] you can also go to the American diabetes association

## 2012-09-23 ENCOUNTER — Other Ambulatory Visit: Payer: Self-pay | Admitting: Nurse Practitioner

## 2012-09-23 ENCOUNTER — Other Ambulatory Visit: Payer: Self-pay | Admitting: Family Medicine

## 2012-10-24 ENCOUNTER — Ambulatory Visit (INDEPENDENT_AMBULATORY_CARE_PROVIDER_SITE_OTHER): Payer: Medicare Other | Admitting: Medical

## 2012-10-24 ENCOUNTER — Encounter: Payer: Self-pay | Admitting: Medical

## 2012-10-24 VITALS — BP 120/70 | HR 92 | Temp 98.2°F | Resp 18 | Wt 273.0 lb

## 2012-10-24 DIAGNOSIS — L600 Ingrowing nail: Secondary | ICD-10-CM

## 2012-10-24 MED ORDER — AMOXICILLIN-POT CLAVULANATE 875-125 MG PO TABS: 1.0000 | ORAL_TABLET | Freq: Two times a day (BID) | ORAL | Status: DC

## 2012-10-24 MED ORDER — HYDROCODONE-ACETAMINOPHEN 5-500 MG PO TABS: 1.0000 | ORAL_TABLET | Freq: Four times a day (QID) | ORAL | Status: DC | PRN

## 2012-10-24 NOTE — Progress Notes (Signed)
Subjective:   HPI Here for c/o left great toenail infection.  He reports left great toenail with redness, pain, swelling x 1 wk.  No prior history of similar. No other aggravating or relieving factors. No other complaint.   Review of Systems Constitutional: denies fever, chills, sweats, Skin:+ pus from wound Musculoskeletal: denies arthralgias, myalgias Neurology: no tingling, numbness    Objective:   Physical Exam  General appearance: alert, no distress, WD/WN, male, cooperative  Extremities: left lateral great toe along nailbed with erythema, swelling, tender, slight amount of discharge laterally at the toenail.  Left great toenail ingrowing both sides of nailbed.  Otherwise foot exam normal Pulses:  normal cap refill Neurological: Sensation of great toes normal, strength normal    Assessment & Plan:    Encounter Diagnosis  Name Primary?  . Ingrown toenail Yes   Discussed risks benefits of procedure vs antibiotic alone.  He gave consent for procedure.  Procedure: Cleaned and prepped in usual sterile fashion, used 3 cc of 1% lidocaine without epinephrine for a digital block of the left great toe, used a hemostat and scissors to remove the lateral fifth of the nail both lateral and medial sides of nailbed, irrigated with high-pressure saline, covered with sterile bandage. Patient tolerated procedure well, less than 2 cc blood loss.  Advised he leave bandage on for 2-3 days, then can use Epsom salt soaks, keep wound clean and dry.  Discussed proper nail cutting for fingers and toes.  Lortab script for pain. Discussed signs of infection, and they will call if worse or not improving.  If signs of infection over the weekend as discussed, then begin Augmentin. Recheck 1wk.

## 2012-10-24 NOTE — Patient Instructions (Signed)

## 2012-10-27 ENCOUNTER — Other Ambulatory Visit: Payer: Self-pay | Admitting: Family Medicine

## 2012-10-27 NOTE — Telephone Encounter (Signed)
Is this ok?

## 2012-10-31 ENCOUNTER — Ambulatory Visit (INDEPENDENT_AMBULATORY_CARE_PROVIDER_SITE_OTHER): Payer: Medicare Other | Admitting: Family Medicine

## 2012-10-31 VITALS — BP 120/70 | HR 71

## 2012-10-31 DIAGNOSIS — L600 Ingrowing nail: Secondary | ICD-10-CM

## 2012-10-31 NOTE — Progress Notes (Signed)
  Subjective:    Patient ID: Jimmy [name], male    DOB: April 28, 1946, 66 y.o.   MRN: [id number]  HPI He is here for recheck on recent procedure for ingrown toenail. He is having no difficulty with this.   Review of Systems     Objective:   Physical Exam Exam of the left great toe shows slight swelling and redness but not hot or tender. The wound seems to be healing well.       Assessment & Plan:   1. Ingrown toenail    continue with conservative care.

## 2013-01-06 ENCOUNTER — Ambulatory Visit (INDEPENDENT_AMBULATORY_CARE_PROVIDER_SITE_OTHER): Payer: Medicare Other | Admitting: Family Medicine

## 2013-01-06 ENCOUNTER — Encounter: Payer: Self-pay | Admitting: Family Medicine

## 2013-01-06 VITALS — BP 128/80 | HR 78 | Wt 268.0 lb

## 2013-01-06 DIAGNOSIS — T387X5A Adverse effect of androgens and anabolic congeners, initial encounter: Secondary | ICD-10-CM

## 2013-01-06 DIAGNOSIS — E118 Type 2 diabetes mellitus with unspecified complications: Secondary | ICD-10-CM | POA: Insufficient documentation

## 2013-01-06 DIAGNOSIS — E66813 Obesity, class 3: Secondary | ICD-10-CM

## 2013-01-06 DIAGNOSIS — E119 Type 2 diabetes mellitus without complications: Secondary | ICD-10-CM

## 2013-01-06 DIAGNOSIS — G4730 Sleep apnea, unspecified: Secondary | ICD-10-CM

## 2013-01-06 DIAGNOSIS — E785 Hyperlipidemia, unspecified: Secondary | ICD-10-CM

## 2013-01-06 DIAGNOSIS — M818 Other osteoporosis without current pathological fracture: Secondary | ICD-10-CM

## 2013-01-06 DIAGNOSIS — I2510 Atherosclerotic heart disease of native coronary artery without angina pectoris: Secondary | ICD-10-CM

## 2013-01-06 DIAGNOSIS — I10 Essential (primary) hypertension: Secondary | ICD-10-CM

## 2013-01-06 LAB — POCT GLYCOSYLATED HEMOGLOBIN (HGB A1C): Hemoglobin A1C: 6.3

## 2013-01-06 NOTE — Progress Notes (Signed)
  Subjective:    Patient ID: Jimmy [name], male    DOB: June 15, 1946, 67 y.o.   MRN: [id number]  HPI  He is here for follow up on his diabetes. He continues to slowly lose weight. He is now getting ready to start in an exercise program as provided through his new insurance plan. He continues on medications listed in the chart. He does not check his blood sugars with any regularity. He does not smoke or drink. He does keep himself fairly active. He has had an eye exam within the last year. He does intermittently check his feet. He intermittently uses his CPAP machine. He is being followed by urology and apparently will have another DEXA scan done in the near future for followup on his osteoporosis. He has had no difficulty with chest pain, shortness of breath, PND or DOE.   Review of Systems     Objective:   Physical Exam Alert and in no distress otherwise not examined. Hemoglobin A1c is 6.3       Assessment & Plan:   1. Diabetes mellitus, new onset  POCT glycosylated hemoglobin (Hb A1C)  2. Sleep apnea    3. Osteoporosis due to androgen therapy    4. ASHD (arteriosclerotic heart disease)    5. Obesity, Class III, BMI 40-49.9 (morbid obesity)    6. Hypertension    7. Hyperlipidemia LDL goal < 70    8. Type II or unspecified type diabetes mellitus without mention of complication, not stated as uncontrolled     encouraged him to continue with his weight reduction and strongly encouraged him to get involved in the exercise program provided through his insurance plan. Recheck here in 4 months.

## 2013-01-28 ENCOUNTER — Other Ambulatory Visit: Payer: Self-pay | Admitting: Cardiology

## 2013-04-15 ENCOUNTER — Ambulatory Visit (INDEPENDENT_AMBULATORY_CARE_PROVIDER_SITE_OTHER): Payer: Medicare Other | Admitting: Cardiology

## 2013-04-15 ENCOUNTER — Encounter: Payer: Self-pay | Admitting: Cardiology

## 2013-04-15 VITALS — BP 124/82 | HR 69 | Ht 69.0 in | Wt 282.0 lb

## 2013-04-15 DIAGNOSIS — R7402 Elevation of levels of lactic acid dehydrogenase (LDH): Secondary | ICD-10-CM

## 2013-04-15 DIAGNOSIS — I25810 Atherosclerosis of coronary artery bypass graft(s) without angina pectoris: Secondary | ICD-10-CM

## 2013-04-15 DIAGNOSIS — E66813 Obesity, class 3: Secondary | ICD-10-CM

## 2013-04-15 DIAGNOSIS — R748 Abnormal levels of other serum enzymes: Secondary | ICD-10-CM

## 2013-04-15 DIAGNOSIS — E7800 Pure hypercholesterolemia, unspecified: Secondary | ICD-10-CM

## 2013-04-15 DIAGNOSIS — I2510 Atherosclerotic heart disease of native coronary artery without angina pectoris: Secondary | ICD-10-CM

## 2013-04-15 DIAGNOSIS — E785 Hyperlipidemia, unspecified: Secondary | ICD-10-CM

## 2013-04-15 LAB — LIPID PANEL
Cholesterol: 219 mg/dL — ABNORMAL HIGH (ref 0–200)
HDL: 34.4 mg/dL — ABNORMAL LOW (ref >39.00)
Total CHOL/HDL Ratio: 6
Triglycerides: 435 mg/dL — ABNORMAL HIGH (ref 0.0–149.0)
VLDL: 87 mg/dL — ABNORMAL HIGH (ref 0.0–40.0)

## 2013-04-15 LAB — LDL CHOLESTEROL, DIRECT: Direct LDL: 115.5 mg/dL

## 2013-04-15 NOTE — Patient Instructions (Addendum)
Your physician wants you to follow-up in: 12 months with Dr Earlean Shawl will receive a reminder letter in the mail two months in advance. If you don't receive a letter, please call our office to schedule the follow-up appointment.   Your physician recommends that you return for lab work today: lipid/liver/virul hepatitis panel

## 2013-04-16 NOTE — Progress Notes (Signed)
Patient ID: Jimmy [name], male   DOB: December 17, 1946, 67 y.o.   MRN: [id number] PCP: Dr. Susann [name]  67 yo with history of nonobstructive CAD and possible RCA dissection in 2000 presents for cardiology evaluation.  In general, Jimmy [name] has been doing well.  No exertional chest pain.  He is short of breath if he walks fast up a hill.  He can walk at a normal pace and climb a flight of steps without problems.  He does a lot of hunting in the fall and winter.  He is retired.  Weight is down 4 lbs since last appointment.  LDL better on Livalo when last checked but triglycerides too high.   He is no longer taking fenofibrate due to leg pain (resolved when he stopped it).   ECG: NSR, 1st degree AV block  Labs (8/12): K 4.1, creatinine 0.94, ALT 65, AST 49, LDL 109, HDL 37, TGs 392 Labs (9/13): K 4.1, creatinine 0.88, AST 52, ALT 66, TGs 365, LDL 84, HDL 35  PMH: 1. Obesity 2. OSA: Uses CPAP 3. Hyperlipidemia: myalgias with Crestor, fenofibrate, and Lipitor.Tolerates Livalo. 4. Hives with Plavix 5. CAD: Cath in 2000 with nonobstructive disease and concern for possible RCA dissection.  Normal myovew in 3/ll.   6. Prostate cancer s/p seed implantation and radiation.   FH: No premature CAD  SH: Married.  Quit smoking in 2000.  Lives in [place].  2 daughters, 5 grandsons.  Retired Jimmy [name].   Current Outpatient Prescriptions  Medication Sig Dispense Refill  . ASPIRIN PO Take 81 mg by mouth daily.       . calcium citrate-vitamin D (CITRACAL+D) 315-200 MG-UNIT per tablet Take 4 tablets by mouth daily.        . cholecalciferol (VITAMIN D) 1000 UNITS tablet Take 2,000 Units by mouth daily.       [name] denosumab (PROLIA) 60 MG/ML SOLN injection Inject 60 mg into the skin every 6 (six) months. Administer in upper arm, thigh, or abdomen      . hydrochlorothiazide (HYDRODIURIL) 25 MG tablet TAKE 1 TABLET EVERY DAY  30 tablet  11  . LIVALO 4 MG TABS TAKE 1 TABLET EVERY DAY  30 tablet   11  . LOVAZA 1 G capsule TAKE 4 CAPSULE BY MOUTH ONCE A DAY  120 capsule  6  . Magnesium 250 MG TABS Take by mouth.        . Multiple Vitamins-Minerals (MULTIVITAMIN WITH MINERALS) tablet Take 1 tablet by mouth daily.        . fenofibrate (TRICOR) 145 MG tablet TAKE 1 TABLET EVERY DAY  30 tablet  5   No current facility-administered medications for this visit.    BP 124/82  Pulse 69  Ht 5\' 9"  (1.753 m)  Wt 282 lb (127.914 kg)  BMI 41.63 kg/m2 General: NAD, obese Neck: Thick, no JVD, no thyromegaly or thyroid nodule.  Lungs: Clear to auscultation bilaterally with normal respiratory effort. CV: Nondisplaced PMI.  Heart regular S1/S2, no S3/S4, no murmur.  1+ edema to knees bilaterally.  No carotid bruit.  Normal pedal pulses.  Abdomen: Soft, nontender, no hepatosplenomegaly, no distention.  Neurologic: Alert and oriented x 3.  Psych: Normal affect. Extremities: No clubbing or cyanosis.   Assessment/Plan: 1. CAD: No ischemic symptoms.  Continue ASA 81, statin.  I encouraged him to start more regular exercise (walking 20-30 minutes 5-6 times a week).  2. Hyperlipidemia:  Continue Livalo.  I will check lipids/LFTs.  I suspect that his chronic mild elevation in LFTs is due to NASH.  However, I cannot find where viral hepatitis labs were ever checked so I will do that today. 3. Obesity: Needs to lose more weight.  We set a goal of 20 lbs over the next year.   Jimmy [name] 04/16/2013

## 2013-04-28 ENCOUNTER — Other Ambulatory Visit: Payer: Self-pay | Admitting: Family Medicine

## 2013-05-07 ENCOUNTER — Ambulatory Visit (INDEPENDENT_AMBULATORY_CARE_PROVIDER_SITE_OTHER): Payer: Medicare Other | Admitting: Family Medicine

## 2013-05-07 ENCOUNTER — Telehealth: Payer: Self-pay | Admitting: Family Medicine

## 2013-05-07 ENCOUNTER — Encounter: Payer: Self-pay | Admitting: Family Medicine

## 2013-05-07 VITALS — BP 116/70 | HR 78 | Wt 278.0 lb

## 2013-05-07 DIAGNOSIS — I10 Essential (primary) hypertension: Secondary | ICD-10-CM

## 2013-05-07 DIAGNOSIS — Z9119 Patient's noncompliance with other medical treatment and regimen: Secondary | ICD-10-CM

## 2013-05-07 DIAGNOSIS — I2510 Atherosclerotic heart disease of native coronary artery without angina pectoris: Secondary | ICD-10-CM

## 2013-05-07 DIAGNOSIS — Z91199 Patient's noncompliance with other medical treatment and regimen due to unspecified reason: Secondary | ICD-10-CM

## 2013-05-07 DIAGNOSIS — E119 Type 2 diabetes mellitus without complications: Secondary | ICD-10-CM

## 2013-05-07 LAB — POCT GLYCOSYLATED HEMOGLOBIN (HGB A1C): Hemoglobin A1C: 8.2

## 2013-05-07 NOTE — Telephone Encounter (Signed)
Called in meter lancets and test strips pt is to test one time a day before eating or at least 2 hours after every time I tried to put in a meter it showed his ins. Wouldn't pay so left message at pharmacy to give what ever his ins. Would pay for

## 2013-05-07 NOTE — Telephone Encounter (Signed)
He needs a glucometer and strips

## 2013-05-07 NOTE — Patient Instructions (Signed)
Keep working with the dietitian. I would like to see you walk every day for at least 20 minutes. You can divide that time up if you would like. We will start you on Victoza. Cheri will call you.

## 2013-05-07 NOTE — Telephone Encounter (Signed)
Jimmy [name] called concerning Jimmy [name]'s A1c results from today. She has questions/concerns that it jumped that much from 6.3 to 8.2 in 4 months. She states that he has been more active and has even lost weight. She is wondering if the reading could be "off". Another reason she is wondering is because she had a "high" reading with Selena Batten in the office and she questioned it  and they rechecked it (lab draw) and it was lower. She certainly wants him on medication if he needs it but they have been working to keep him off meds and now she just has concerns about the results. Also, if they truly went up that much, she wants to make sure he is watching his diet etc

## 2013-05-07 NOTE — Progress Notes (Signed)
Subjective:    Jimmy [name] is a 67 y.o. male who presents for follow-up of Type 2 diabetes mellitus.    Home blood sugar records: not checking  Current symptoms/problems : none Daily foot checks, foot concerns: no  Last eye exam: 2 years going to go  June or July   Medication compliance: good Current diet: yes watching what he eats Current exercise: walking 3 to 5 miles 2 x a week Known diabetic complications: impotence and cardiovascular disease Cardiovascular risk factors: advanced age (older than 30 for men, 63 for women), diabetes mellitus, dyslipidemia, hypertension, male gender, obesity (BMI >= 30 kg/m2) and sedentary lifestyle   The following portions of the patient's history were reviewed and updated as appropriate: allergies, current medications, past medical history, past social history, past surgical history and problem list.  ROS as in subjective above    Objective:  Alert and in no distress otherwise not examined  hemoglobin A1c 8.2  Lab Review Lab Results  Component Value Date   HGBA1C 6.3 01/06/2013   Lab Results  Component Value Date   CHOL 219* 04/15/2013   HDL 34.40* 04/15/2013   LDLCALC 84 09/16/2012   LDLDIRECT 115.5 04/15/2013   TRIG 435.0 Triglyceride is over 400; calculations on Lipids are invalid.* 04/15/2013   CHOLHDL 6 04/15/2013   No results found for this basename: [facility]     Chemistry      Component Value Date/Time   NA 137 09/16/2012 0856   K 4.1 09/16/2012 0856   CL 103 09/16/2012 0856   CO2 23 09/16/2012 0856   BUN 17 09/16/2012 0856   CREATININE 0.88 09/16/2012 0856   CREATININE 0.80 03/22/2008 1304      Component Value Date/Time   CALCIUM 9.9 09/16/2012 0856   ALKPHOS 33* 09/16/2012 0856   AST 52* 09/16/2012 0856   ALT 66* 09/16/2012 0856   BILITOT 0.4 09/16/2012 0856        Chemistry      Component Value Date/Time   NA 137 09/16/2012 0856   K 4.1 09/16/2012 0856   CL 103 09/16/2012 0856   CO2 23 09/16/2012 0856   BUN  17 09/16/2012 0856   CREATININE 0.88 09/16/2012 0856   CREATININE 0.80 03/22/2008 1304      Component Value Date/Time   CALCIUM 9.9 09/16/2012 0856   ALKPHOS 33* 09/16/2012 0856   AST 52* 09/16/2012 0856   ALT 66* 09/16/2012 0856   BILITOT 0.4 09/16/2012 0856       Last optometry/ophthalmology exam reviewed from:    Assessment:   Encounter Diagnoses  Name Primary?  . Type II or unspecified type diabetes mellitus without mention of complication, not stated as uncontrolled Yes  . Personal history of noncompliance with medical treatment, presenting hazards to health   . ASHD (arteriosclerotic heart disease)   . Obesity, Class III, BMI 40-49.9 (morbid obesity)   . Hypertension   I had a discussion with him concerning his noncompliance with checking his blood sugar. He is exercising but only twice per week. Strongly encouraged him to do at least 20 minutes of exercise every day. I explained that his present lifestyle is a danger to himself. He did for him to do a better job so he can watch his grandchildren graduate from college.       Plan:    1.  Rx changes: Victoza will be added 2.  Education: Reviewed 'ABCs' of diabetes management (respective goals in parentheses):  A1C (<7), blood pressure (<  130/80), and cholesterol (LDL <100). 3.  Compliance at present is estimated to be poor. Efforts to improve compliance (if necessary) will be directed at dietary modifications: Continue to work with her dietitian, increased exercise and regular blood sugar monitoring: daily. 4. Follow up: 4 months

## 2013-05-11 ENCOUNTER — Telehealth: Payer: Self-pay

## 2013-05-11 NOTE — Telephone Encounter (Signed)
VICTOZA IS IN HE WILL COME BY PICK

## 2013-05-18 ENCOUNTER — Encounter: Payer: Self-pay | Admitting: Internal Medicine

## 2013-05-26 ENCOUNTER — Telehealth: Payer: Self-pay | Admitting: Family Medicine

## 2013-05-26 MED ORDER — LIRAGLUTIDE 18 MG/3ML ~~LOC~~ SOPN: 1.2000 mg | PEN_INJECTOR | Freq: Every day | SUBCUTANEOUS | Status: DC

## 2013-05-26 NOTE — Telephone Encounter (Signed)
SENT IN VICTOZA PT TAKING 1.2 DAILY

## 2013-05-26 NOTE — Telephone Encounter (Signed)
Check on the exact dosing and give him a prescription for that. Have him followup with me in his usual four-month cycle

## 2013-05-27 ENCOUNTER — Telehealth: Payer: Self-pay | Admitting: Family Medicine

## 2013-05-27 MED ORDER — "PEN NEEDLES 3/16"" 31G X 5 MM MISC": 1.0000 | Freq: Every day | Status: DC

## 2013-05-27 NOTE — Telephone Encounter (Signed)
Pen ned. sent

## 2013-06-05 ENCOUNTER — Encounter: Payer: Self-pay | Admitting: Cardiology

## 2013-06-08 ENCOUNTER — Encounter: Payer: Self-pay | Admitting: Cardiology

## 2013-07-16 ENCOUNTER — Other Ambulatory Visit: Payer: Self-pay | Admitting: Family Medicine

## 2013-08-30 ENCOUNTER — Other Ambulatory Visit: Payer: Self-pay | Admitting: Cardiology

## 2013-08-30 ENCOUNTER — Other Ambulatory Visit: Payer: Self-pay | Admitting: Family Medicine

## 2013-09-07 ENCOUNTER — Ambulatory Visit (INDEPENDENT_AMBULATORY_CARE_PROVIDER_SITE_OTHER): Payer: Medicare Other | Admitting: Family Medicine

## 2013-09-07 ENCOUNTER — Encounter: Payer: Self-pay | Admitting: Family Medicine

## 2013-09-07 VITALS — BP 116/78 | HR 78 | Wt 270.0 lb

## 2013-09-07 DIAGNOSIS — E119 Type 2 diabetes mellitus without complications: Secondary | ICD-10-CM

## 2013-09-07 DIAGNOSIS — I2510 Atherosclerotic heart disease of native coronary artery without angina pectoris: Secondary | ICD-10-CM

## 2013-09-07 DIAGNOSIS — I10 Essential (primary) hypertension: Secondary | ICD-10-CM

## 2013-09-07 DIAGNOSIS — E785 Hyperlipidemia, unspecified: Secondary | ICD-10-CM

## 2013-09-07 DIAGNOSIS — E66813 Obesity, class 3: Secondary | ICD-10-CM

## 2013-09-07 LAB — POCT UA - MICROALBUMIN
Albumin/Creatinine Ratio, Urine, POC: 5.1
Creatinine, POC: 148.3 mg/dL
Microalbumin Ur, POC: 7.6 mg/L

## 2013-09-07 LAB — POCT GLYCOSYLATED HEMOGLOBIN (HGB A1C): Hemoglobin A1C: 6.1

## 2013-09-07 NOTE — Progress Notes (Signed)
Subjective:    Jimmy [name] is a 67 y.o. male who presents for follow-up of Type 2 diabetes mellitus.    Home blood sugar records: 125  Current symptoms/problems none Daily foot checks: yes   Any foot concerns:  yes/toe nail fungus Last eye exam:   Nov 2014   Medication compliance: Good Current diet: yes diabetic he sees a dietitian regularly. Current exercise: walking 25 min day Known diabetic complications: impotence and cardiovascular disease Cardiovascular risk factors: advanced age (older than 94 for men, 25 for women), diabetes mellitus, dyslipidemia, hypertension, male gender and obesity (BMI >= 30 kg/m2) He is getting ready to go to [facility] to spend several months there.  The following portions of the patient's history were reviewed and updated as appropriate: allergies, current medications, past family history, past medical history, past social history and problem list.  ROS as in subjective above    Objective:    Wt 270 lb (122.471 kg)  BMI 39.85 kg/m2  There were no vitals filed for this visit.  General appearence: alert, no distress, WD/WN HbA1c is 6.1 Lab Review Lab Results  Component Value Date   HGBA1C 8.2 05/07/2013   Lab Results  Component Value Date   CHOL 219* 04/15/2013   HDL 34.40* 04/15/2013   LDLCALC 84 09/16/2012   LDLDIRECT 115.5 04/15/2013   TRIG 435.0 Triglyceride is over 400; calculations on Lipids are invalid.* 04/15/2013   CHOLHDL 6 04/15/2013   No results found for this basename: [facility]     Chemistry      Component Value Date/Time   NA 137 09/16/2012 0856   K 4.1 09/16/2012 0856   CL 103 09/16/2012 0856   CO2 23 09/16/2012 0856   BUN 17 09/16/2012 0856   CREATININE 0.88 09/16/2012 0856   CREATININE 0.80 03/22/2008 1304      Component Value Date/Time   CALCIUM 9.9 09/16/2012 0856   ALKPHOS 33* 09/16/2012 0856   AST 52* 09/16/2012 0856   ALT 66* 09/16/2012 0856   BILITOT 0.4 09/16/2012 0856        Chemistry       Component Value Date/Time   NA 137 09/16/2012 0856   K 4.1 09/16/2012 0856   CL 103 09/16/2012 0856   CO2 23 09/16/2012 0856   BUN 17 09/16/2012 0856   CREATININE 0.88 09/16/2012 0856   CREATININE 0.80 03/22/2008 1304      Component Value Date/Time   CALCIUM 9.9 09/16/2012 0856   ALKPHOS 33* 09/16/2012 0856   AST 52* 09/16/2012 0856   ALT 66* 09/16/2012 0856   BILITOT 0.4 09/16/2012 0856         Assessment:  Type II or unspecified type diabetes mellitus without mention of complication, not stated as uncontrolled - Plan: POCT glycosylated hemoglobin (Hb A1C), POCT UA - Microalbumin  ASHD (arteriosclerotic heart disease)  Obesity, Class III, BMI 40-49.9 (morbid obesity)  Hyperlipidemia LDL goal < 70  Hypertension        Plan:    1.  Rx changes: none 2.  Education: Reviewed 'ABCs' of diabetes management (respective goals in parentheses):  A1C (<7), blood pressure (<130/80), and cholesterol (LDL <100). 3.  Compliance at present is estimated to be good. Efforts to improve compliance (if necessary) will be directed at dietary modifications: o and increased exercise. 4. Follow up: 4 months  Discussed the fact that his lifestyle might change when he goes to [facility]. He states that he thinks that he will become more  physically active and more likely eat better.

## 2013-09-21 ENCOUNTER — Other Ambulatory Visit: Payer: Self-pay | Admitting: Family Medicine

## 2013-10-06 ENCOUNTER — Other Ambulatory Visit (INDEPENDENT_AMBULATORY_CARE_PROVIDER_SITE_OTHER): Payer: Medicare Other

## 2013-10-06 DIAGNOSIS — Z23 Encounter for immunization: Secondary | ICD-10-CM

## 2013-11-17 ENCOUNTER — Encounter: Payer: Self-pay | Admitting: Internal Medicine

## 2013-11-17 LAB — HM DIABETES EYE EXAM

## 2013-12-02 ENCOUNTER — Other Ambulatory Visit: Payer: Self-pay | Admitting: Family Medicine

## 2013-12-07 ENCOUNTER — Other Ambulatory Visit: Payer: Self-pay

## 2013-12-07 ENCOUNTER — Telehealth: Payer: Self-pay | Admitting: Family Medicine

## 2013-12-07 MED ORDER — OMEGA-3-ACID ETHYL ESTERS 1 G PO CAPS: ORAL_CAPSULE | ORAL | Status: DC

## 2013-12-07 NOTE — Telephone Encounter (Signed)
   Needs refill on [facility], we either refilled or authorized refill on 12/3 BUT they need 90 day supply (big cost savings)    [facility]   Also, is there a way to "flag" his chart to fill all Rx's for 90 day supply?

## 2013-12-07 NOTE — Telephone Encounter (Signed)
SENT IN 90 DAY SUPPLY 

## 2013-12-07 NOTE — Telephone Encounter (Signed)
NO PT NEEDS TO INFORM us AND OR PHARMACY

## 2014-01-07 ENCOUNTER — Encounter: Payer: Self-pay | Admitting: Family Medicine

## 2014-01-07 ENCOUNTER — Ambulatory Visit (INDEPENDENT_AMBULATORY_CARE_PROVIDER_SITE_OTHER): Payer: Medicare Other | Admitting: Family Medicine

## 2014-01-07 VITALS — BP 126/74 | HR 73 | Wt 264.0 lb

## 2014-01-07 DIAGNOSIS — I2510 Atherosclerotic heart disease of native coronary artery without angina pectoris: Secondary | ICD-10-CM

## 2014-01-07 DIAGNOSIS — Z79899 Other long term (current) drug therapy: Secondary | ICD-10-CM

## 2014-01-07 DIAGNOSIS — G4730 Sleep apnea, unspecified: Secondary | ICD-10-CM

## 2014-01-07 DIAGNOSIS — I10 Essential (primary) hypertension: Secondary | ICD-10-CM

## 2014-01-07 DIAGNOSIS — Z8546 Personal history of malignant neoplasm of prostate: Secondary | ICD-10-CM

## 2014-01-07 DIAGNOSIS — E119 Type 2 diabetes mellitus without complications: Secondary | ICD-10-CM

## 2014-01-07 DIAGNOSIS — E785 Hyperlipidemia, unspecified: Secondary | ICD-10-CM

## 2014-01-07 LAB — POCT GLYCOSYLATED HEMOGLOBIN (HGB A1C): Hemoglobin A1C: 6.4

## 2014-01-07 LAB — CBC WITH DIFFERENTIAL/PLATELET
Basophils Absolute: 0 10*3/uL (ref 0.0–0.1)
Basophils Relative: 0 % (ref 0–1)
Eosinophils Absolute: 0.1 10*3/uL (ref 0.0–0.7)
Eosinophils Relative: 3 % (ref 0–5)
HCT: 46.2 % (ref 39.0–52.0)
Hemoglobin: 15.8 g/dL (ref 13.0–17.0)
Lymphocytes Relative: 18 % (ref 12–46)
Lymphs Abs: 0.9 10*3/uL (ref 0.7–4.0)
MCH: 30.4 pg (ref 26.0–34.0)
MCHC: 34.2 g/dL (ref 30.0–36.0)
MCV: 89 fL (ref 78.0–100.0)
Monocytes Absolute: 0.5 10*3/uL (ref 0.1–1.0)
Monocytes Relative: 9 % (ref 3–12)
Neutro Abs: 3.4 10*3/uL (ref 1.7–7.7)
Neutrophils Relative %: 70 % (ref 43–77)
Platelets: 142 10*3/uL — ABNORMAL LOW (ref 150–400)
RBC: 5.19 MIL/uL (ref 4.22–5.81)
RDW: 14.4 % (ref 11.5–15.5)
WBC: 4.9 10*3/uL (ref 4.0–10.5)

## 2014-01-07 NOTE — Addendum Note (Signed)
Addended by: Lise Auer on: 01/07/2014 10:16 AM   Modules accepted: Orders

## 2014-01-07 NOTE — Progress Notes (Signed)
   Subjective:    Patient ID: [name], male    DOB: 09-Mar-1946, 68 y.o.   MRN: [id number]  HPI He is here for a diabetes recheck. He continues on medications listed in the chart. He checks his blood sugars usually in the mornings and they're usually around 120. He has several times per week and walks for this. Review his record indicates he has lost weight since his last visit. He does check his feet daily. Eye exam was 2 months ago. He saw his cardiologist 4 months ago. He's had no difficulty with chest pain, shortness of breath. Continues on his CPAP and is having no difficulty with that.   Review of Systems     Objective:   Physical Exam Alert and in no distress. Foot exam was done and no dorsalis pedis pulse was appreciated. Hemoglobin A1c is 6.4.       Assessment & Plan:  Sleep apnea  Type II or unspecified type diabetes mellitus without mention of complication, not stated as uncontrolled - Plan: CBC with Differential, Comprehensive metabolic panel, Lipid panel, POCT UA - Microalbumin, Ankle brachial index  Hyperlipidemia LDL goal < 70 - Plan: Lipid panel  Hypertension - Plan: CBC with Differential, Comprehensive metabolic panel  History of prostate cancer  ASHD (arteriosclerotic heart disease)  Encounter for long-term (current) use of other medications - Plan: CBC with Differential, Comprehensive metabolic panel, Lipid panel, POCT UA - Microalbumin, Ankle brachial index  Occurs him to continue with his lifestyle and with his weight loss.

## 2014-01-08 LAB — LIPID PANEL
Cholesterol: 172 mg/dL (ref 0–200)
HDL: 45 mg/dL (ref >39)
LDL Cholesterol: 70 mg/dL (ref 0–99)
Total CHOL/HDL Ratio: 3.8 Ratio
Triglycerides: 284 mg/dL — ABNORMAL HIGH (ref <150)
VLDL: 57 mg/dL — ABNORMAL HIGH (ref 0–40)

## 2014-01-08 LAB — COMPREHENSIVE METABOLIC PANEL
ALT: 48 U/L (ref 0–53)
AST: 36 U/L (ref 0–37)
Albumin: 4.4 g/dL (ref 3.5–5.2)
Alkaline Phosphatase: 47 U/L (ref 39–117)
BUN: 16 mg/dL (ref 6–23)
CO2: 25 mEq/L (ref 19–32)
Calcium: 10 mg/dL (ref 8.4–10.5)
Chloride: 101 mEq/L (ref 96–112)
Creat: 0.83 mg/dL (ref 0.50–1.35)
Glucose, Bld: 114 mg/dL — ABNORMAL HIGH (ref 70–99)
Potassium: 4 mEq/L (ref 3.5–5.3)
Sodium: 136 mEq/L (ref 135–145)
Total Bilirubin: 0.4 mg/dL (ref 0.3–1.2)
Total Protein: 7.5 g/dL (ref 6.0–8.3)

## 2014-01-08 NOTE — Progress Notes (Signed)
LMOM with results and informed pt dietary information will be mailed

## 2014-01-08 NOTE — Progress Notes (Signed)
LMOM with pt results, and dietary information will be mailed

## 2014-02-09 ENCOUNTER — Ambulatory Visit: Payer: Medicare Other | Admitting: Family Medicine

## 2014-05-05 ENCOUNTER — Ambulatory Visit: Payer: Medicare Other | Admitting: Family Medicine

## 2014-05-07 ENCOUNTER — Encounter: Payer: Self-pay | Admitting: Family Medicine

## 2014-05-07 ENCOUNTER — Ambulatory Visit (INDEPENDENT_AMBULATORY_CARE_PROVIDER_SITE_OTHER): Payer: Medicare Other | Admitting: Family Medicine

## 2014-05-07 ENCOUNTER — Ambulatory Visit: Payer: Medicare Other | Admitting: Family Medicine

## 2014-05-07 VITALS — BP 122/72 | HR 82 | Wt 277.0 lb

## 2014-05-07 DIAGNOSIS — E669 Obesity, unspecified: Secondary | ICD-10-CM

## 2014-05-07 DIAGNOSIS — E785 Hyperlipidemia, unspecified: Secondary | ICD-10-CM

## 2014-05-07 DIAGNOSIS — I2510 Atherosclerotic heart disease of native coronary artery without angina pectoris: Secondary | ICD-10-CM

## 2014-05-07 DIAGNOSIS — E119 Type 2 diabetes mellitus without complications: Secondary | ICD-10-CM

## 2014-05-07 DIAGNOSIS — I10 Essential (primary) hypertension: Secondary | ICD-10-CM

## 2014-05-07 LAB — POCT GLYCOSYLATED HEMOGLOBIN (HGB A1C): Hemoglobin A1C: 7.1

## 2014-05-07 NOTE — Patient Instructions (Addendum)
Pay attention to full vs empty vs content Try to do something physical every day

## 2014-05-07 NOTE — Progress Notes (Signed)
   Subjective:    Patient ID: Jimmy [name], male    DOB: [birth year], 68 y.o.   MRN: [id number]  HPI He is here for recheck. His weight continues to fluctuate. He says he is walking 30-60 minutes 3 times per week. He does keep himself fairly active. He is seen monthly by Tonie Griffith. He does periodically check his blood sugars. He has had foot and eye exam. He continues to be followed by his cardiologist. Medications are unchanged. Smoking and drinking were reviewed. His last colonoscopy was 2005. Review of Systems     Objective:   Physical Exam Alert and in no distress. Hemoglobin A1c is 7.1.       Assessment & Plan:  Type II or unspecified type diabetes mellitus without mention of complication, not stated as uncontrolled - Plan: HgB A1c, Ankle brachial index  Obesity (BMI 30-39.9)  Hypertension  Hyperlipidemia LDL goal < 70  ASHD (arteriosclerotic heart disease)  discussed diet and exercise with him as well as his eating habits. He seems to need to get full rather than satisfied. Encouraged him to increase his physical activity and pay more attention to whether he is truly satisfied or not. Tried to stress to him the concept of what versus need in regard to eating. We will also set him up for colonoscopy.

## 2014-05-10 ENCOUNTER — Telehealth: Payer: Self-pay

## 2014-05-10 NOTE — Telephone Encounter (Signed)
Called and LM to inform pt of GI appointment with Dr. Penelope Coop at 2:00 on 05/28/14.

## 2014-05-26 ENCOUNTER — Ambulatory Visit (INDEPENDENT_AMBULATORY_CARE_PROVIDER_SITE_OTHER): Payer: Medicare Other | Admitting: Cardiology

## 2014-05-26 ENCOUNTER — Encounter: Payer: Self-pay | Admitting: Cardiology

## 2014-05-26 VITALS — BP 122/76 | HR 74 | Ht 69.0 in | Wt 278.0 lb

## 2014-05-26 DIAGNOSIS — E785 Hyperlipidemia, unspecified: Secondary | ICD-10-CM

## 2014-05-26 DIAGNOSIS — I10 Essential (primary) hypertension: Secondary | ICD-10-CM

## 2014-05-26 DIAGNOSIS — I2510 Atherosclerotic heart disease of native coronary artery without angina pectoris: Secondary | ICD-10-CM

## 2014-05-26 DIAGNOSIS — E669 Obesity, unspecified: Secondary | ICD-10-CM

## 2014-05-26 NOTE — Patient Instructions (Signed)
Your physician wants you to follow-up in: 1 year with Dr Aundra Dubin. (May 2015).  You will receive a reminder letter in the mail two months in advance. If you don't receive a letter, please call our office to schedule the follow-up appointment.

## 2014-05-26 NOTE — Progress Notes (Signed)
Patient ID: Jimmy [name], male   DOB: [birth year], 68 y.o.   MRN: [id number] PCP: Dr. Redmond School  68 yo with history of nonobstructive CAD and possible RCA dissection in 2000 presents for cardiology evaluation.  In general, Mr Jimmy [name] has been doing well.  No exertional chest pain.  He is short of breath if he walks fast up a hill.  He can walk at a normal pace and climb a flight of steps without problems.  He does a lot of hunting in the fall and winter.  He lives in a camper at [facility] for 2 months in the early fall.  He is retired.  Weight is down 4 lbs since last appointment. He tolerates Livalo without myalgias.     ECG: NSR, 1st degree AV block  Labs (8/12): K 4.1, creatinine 0.94, ALT 65, AST 49, LDL 109, HDL 37, TGs 392 Labs (9/13): K 4.1, creatinine 0.88, AST 52, ALT 66, TGs 365, LDL 84, HDL 35 Labs (1/15): LDL 70, HDL 45, TGs 284, LFTs normal  PMH: 1. Obesity 2. OSA: Uses CPAP 3. Hyperlipidemia: myalgias with Crestor, fenofibrate, and Lipitor.Tolerates Livalo. 4. Hives with Plavix 5. CAD: Cath in 2000 with nonobstructive disease and concern for possible RCA dissection.  Normal myovew in 3/ll.   6. Prostate cancer s/p seed implantation and radiation.  7. Elevated LFTs: ?NASH.   FH: No premature CAD  SH: Married.  Quit smoking in 2000.  Lives in [place].  2 daughters, 5 grandsons.  Retired Therapist, music at [employer].   Current Outpatient Prescriptions  Medication Sig Dispense Refill  . ASPIRIN PO Take 81 mg by mouth daily.       . calcium citrate-vitamin D (CITRACAL+D) 315-200 MG-UNIT per tablet Take 4 tablets by mouth daily.        . cholecalciferol (VITAMIN D) 1000 UNITS tablet Take 2,000 Units by mouth daily.       . hydrochlorothiazide (HYDRODIURIL) 25 MG tablet TAKE 1 TABLET EVERY DAY  30 tablet  11  . Insulin Pen Needle (PEN NEEDLES 3/16") 31G X 5 MM MISC 1 each by Does not apply route daily.  100 each  prn  . LIVALO 4 MG TABS TAKE 1 TABLET EVERY DAY  30  tablet  11  . Magnesium 250 MG TABS Take by mouth.        . Multiple Vitamins-Minerals (MULTIVITAMIN WITH MINERALS) tablet Take 1 tablet by mouth daily.        [name] omega-3 acid ethyl esters (LOVAZA) 1 G capsule TAKE 4 CAPSULES BY MOUTH EVERY DAY  360 capsule  1  . ONE TOUCH ULTRA TEST test strip TEST DAILY AS DIRECTED  100 each  prn  . ONETOUCH DELICA LANCETS 59F MISC TEST ONCE DAILY AS DIRECTED  100 each  prn  . VICTOZA 18 MG/3ML SOPN INJECT 1.2 MG INTO THE SKIN DAILY.  26 mL  4   No current facility-administered medications for this visit.    BP 122/76  Pulse 74  Ht 5\' 9"  (1.753 m)  Wt 278 lb (126.1 kg)  BMI 41.03 kg/m2 General: NAD, obese Neck: Thick, no JVD, no thyromegaly or thyroid nodule.  Lungs: Clear to auscultation bilaterally with normal respiratory effort. CV: Nondisplaced PMI.  Heart regular S1/S2, no S3/S4, no murmur.  1+ edema to knees bilaterally.  No carotid bruit.  Normal pedal pulses.  Abdomen: Soft, nontender, no hepatosplenomegaly, no distention.  Neurologic: Alert and oriented x 3.  Psych: Normal affect. Extremities: No  clubbing or cyanosis.   Assessment/Plan: 1. CAD: No ischemic symptoms.  Continue ASA 81, statin.  Continue regular exercise. 2. Hyperlipidemia:  Continue Livalo. Good lipids in 1/15 (TGs not perfect but improved).  LFTs now normal.  3. Obesity: Needs to lose more weight.  We talked about diet and exercise.  4. HTN: BP controlled on HCTZ.    Larey Dresser 05/26/2014

## 2014-06-15 ENCOUNTER — Other Ambulatory Visit: Payer: Self-pay | Admitting: Family Medicine

## 2014-07-09 ENCOUNTER — Other Ambulatory Visit: Payer: Self-pay | Admitting: Family Medicine

## 2014-09-10 ENCOUNTER — Encounter: Payer: Self-pay | Admitting: Family Medicine

## 2014-09-10 ENCOUNTER — Ambulatory Visit (INDEPENDENT_AMBULATORY_CARE_PROVIDER_SITE_OTHER): Payer: Medicare Other | Admitting: Family Medicine

## 2014-09-10 VITALS — BP 128/80 | HR 76 | Wt 275.0 lb

## 2014-09-10 DIAGNOSIS — I951 Orthostatic hypotension: Secondary | ICD-10-CM

## 2014-09-10 DIAGNOSIS — E1169 Type 2 diabetes mellitus with other specified complication: Secondary | ICD-10-CM

## 2014-09-10 DIAGNOSIS — E785 Hyperlipidemia, unspecified: Secondary | ICD-10-CM

## 2014-09-10 DIAGNOSIS — E1159 Type 2 diabetes mellitus with other circulatory complications: Secondary | ICD-10-CM

## 2014-09-10 DIAGNOSIS — I10 Essential (primary) hypertension: Secondary | ICD-10-CM

## 2014-09-10 DIAGNOSIS — I152 Hypertension secondary to endocrine disorders: Secondary | ICD-10-CM

## 2014-09-10 DIAGNOSIS — E119 Type 2 diabetes mellitus without complications: Secondary | ICD-10-CM

## 2014-09-10 LAB — POCT GLYCOSYLATED HEMOGLOBIN (HGB A1C): Hemoglobin A1C: 6.9

## 2014-09-10 NOTE — Patient Instructions (Signed)
Keep working on [employer] and exercise

## 2014-09-10 NOTE — Progress Notes (Signed)
Subjective:    Jimmy [name] is a 68 y.o. male who presents for follow-up of Type 2 diabetes mellitus.  Over the last several weeks he noted episodes of dizziness or tender occur at night when he gets out of bed to go to the bathroom. He has no difficulties during the day. He has had no difficulty with headache, blurred vision, double vision or weakness. He has not loss consciousness.  Home blood sugar records: TEST 1 X A DAY AVG 160  Current symptoms/problems NONE Daily foot checks: Any foot concerns:NONE Last eye exam:  11/17/13   Medication compliance: Good Current diet: NONE Current exercise: WALKING 20 MIN TO 1 HR  Known diabetic complications: impotence Cardiovascular risk factors: advanced age (older than 30 for men, 29 for women), diabetes mellitus, dyslipidemia, hypertension, male gender and obesity (BMI >= 30 kg/m2)   The following portions of the patient's history were reviewed and updated as appropriate: allergies, current medications, past medical history, past social history and problem list.  ROS as in subjective above    Objective:    BP 128/80  Pulse 76  Wt 275 lb (124.739 kg)  SpO2 96%  Filed Vitals:   09/10/14 0815  BP: 128/80  Pulse: 76    General appearence: alert, no distress, WD/WN Neck: supple, no lymphadenopathy, no thyromegaly, no masses Heart: RRR, normal S1, S2, no murmurs Lungs: CTA bilaterally, no wheezes, rhonchi, or rales Abdomen: +bs, soft, non tender, non distended, no masses, no hepatomegaly, no splenomegaly Pulses: 2+ symmetric, upper and lower extremities, normal cap refill Ext: no edema Foot exam:  Neuro: foot monofilament exam normal   Lab Review Lab Results  Component Value Date   HGBA1C 6.9 09/10/2014   Lab Results  Component Value Date   CHOL 172 01/07/2014   HDL 45 01/07/2014   LDLCALC 70 01/07/2014   LDLDIRECT 115.5 04/15/2013   TRIG 284* 01/07/2014   CHOLHDL 3.8 01/07/2014   No results found for this basenameDerl Barrow     Chemistry      Component Value Date/Time   NA 136 01/07/2014 0846   K 4.0 01/07/2014 0846   CL 101 01/07/2014 0846   CO2 25 01/07/2014 0846   BUN 16 01/07/2014 0846   CREATININE 0.83 01/07/2014 0846   CREATININE 0.80 03/22/2008 1304      Component Value Date/Time   CALCIUM 10.0 01/07/2014 0846   ALKPHOS 47 01/07/2014 0846   AST 36 01/07/2014 0846   ALT 48 01/07/2014 0846   BILITOT 0.4 01/07/2014 0846        Chemistry      Component Value Date/Time   NA 136 01/07/2014 0846   K 4.0 01/07/2014 0846   CL 101 01/07/2014 0846   CO2 25 01/07/2014 0846   BUN 16 01/07/2014 0846   CREATININE 0.83 01/07/2014 0846   CREATININE 0.80 03/22/2008 1304      Component Value Date/Time   CALCIUM 10.0 01/07/2014 0846   ALKPHOS 47 01/07/2014 0846   AST 36 01/07/2014 0846   ALT 48 01/07/2014 0846   BILITOT 0.4 01/07/2014 0846       Hemoglobin A1c 6.9  Assessment:  Type II or unspecified type diabetes mellitus without mention of complication, not stated as uncontrolled - Plan: POCT glycosylated hemoglobin (Hb A1C)  Postural hypotension  Morbid obesity  Hypertension complicating diabetes  Hyperlipidemia with target LDL less than 70        Plan:    1.  Rx changes:  none 2.  Education: Reviewed 'ABCs' of diabetes management (respective goals in parentheses):  A1C (<7), blood pressure (<130/80), and cholesterol (LDL <100). 3.  Compliance at present is estimated to be fair. Efforts to improve compliance (if necessary) will be directed at increased exercise. 4. Follow up: 4 months  I again encouraged him to become more physically active and make further dietary changes. Also discussed the postural hypotension with him. Encouraged him to set up before standing before walking and if he has further difficulty, he will keep me informed.

## 2014-09-11 ENCOUNTER — Other Ambulatory Visit: Payer: Self-pay | Admitting: Family Medicine

## 2014-10-05 ENCOUNTER — Other Ambulatory Visit: Payer: Self-pay | Admitting: Family Medicine

## 2014-10-05 ENCOUNTER — Other Ambulatory Visit: Payer: Self-pay | Admitting: Cardiology

## 2014-10-11 ENCOUNTER — Other Ambulatory Visit: Payer: Self-pay

## 2014-10-11 MED ORDER — PITAVASTATIN CALCIUM 4 MG PO TABS: ORAL_TABLET | ORAL | Status: DC

## 2014-10-11 MED ORDER — HYDROCHLOROTHIAZIDE 25 MG PO TABS: ORAL_TABLET | ORAL | Status: DC

## 2014-11-10 ENCOUNTER — Other Ambulatory Visit (INDEPENDENT_AMBULATORY_CARE_PROVIDER_SITE_OTHER): Payer: Medicare Other

## 2014-11-10 DIAGNOSIS — Z23 Encounter for immunization: Secondary | ICD-10-CM

## 2014-12-21 ENCOUNTER — Encounter: Payer: Self-pay | Admitting: Family Medicine

## 2014-12-21 ENCOUNTER — Ambulatory Visit (INDEPENDENT_AMBULATORY_CARE_PROVIDER_SITE_OTHER): Payer: Medicare Other | Admitting: Family Medicine

## 2014-12-21 VITALS — BP 150/80 | HR 86 | Temp 98.4°F | Wt 275.0 lb

## 2014-12-21 DIAGNOSIS — J209 Acute bronchitis, unspecified: Secondary | ICD-10-CM

## 2014-12-21 MED ORDER — HYDROCOD POLST-CHLORPHEN POLST 10-8 MG/5ML PO LQCR: 5.0000 mL | Freq: Two times a day (BID) | ORAL | Status: DC | PRN

## 2014-12-21 MED ORDER — AZITHROMYCIN 500 MG PO TABS: 500.0000 mg | ORAL_TABLET | Freq: Every day | ORAL | Status: DC

## 2014-12-21 NOTE — Progress Notes (Signed)
   Subjective:    Patient ID: Jimmy [name], male    DOB: 1946/12/29, 68 y.o.   MRN: [id number]  HPI 4 days ago he developed a dry cough followed by headache, fever, chills, myalgias. No sore throat or earache.   Review of Systems     Objective:   Physical Exam alert and in no distress. Tympanic membranes and canals are normal. Throat is clear. Tonsils are normal. Neck is supple without adenopathy or thyromegaly. Cardiac exam shows a regular sinus rhythm without murmurs or gallops. Lungs are clear to auscultation.        Assessment & Plan:  Acute bronchitis, unspecified organism - Plan: azithromycin (ZITHROMAX) 500 MG tablet, chlorpheniramine-HYDROcodone (TUSSIONEX PENNKINETIC ER) 10-8 MG/5ML LQCR  the symptoms had fairly quickly and think it's appropriate to give him an antibiotic as well as a cough suppressant. He will call me if further difficulty.

## 2014-12-30 ENCOUNTER — Telehealth: Payer: Self-pay | Admitting: Family Medicine

## 2014-12-30 DIAGNOSIS — J209 Acute bronchitis, unspecified: Secondary | ICD-10-CM

## 2014-12-30 MED ORDER — AMOXICILLIN-POT CLAVULANATE 875-125 MG PO TABS: 1.0000 | ORAL_TABLET | Freq: Two times a day (BID) | ORAL | Status: DC

## 2014-12-30 MED ORDER — HYDROCOD POLST-CHLORPHEN POLST 10-8 MG/5ML PO LQCR: 5.0000 mL | Freq: Two times a day (BID) | ORAL | Status: DC | PRN

## 2014-12-30 NOTE — Telephone Encounter (Signed)
[name] [name] will be over in about an hour.

## 2014-12-30 NOTE — Telephone Encounter (Signed)
Call until them that I called the medication again and have him come by office to pick up the prescription. I will bring it by shortly

## 2014-12-30 NOTE — Telephone Encounter (Signed)
Pt's wife called and stated that pt is not much better. His cough is even worse. She is requesting that another round of medication be sent in for him. She also wants something for his cough. Pt was offered an appt today but [name] states she wanted me to send JCL a message. Pt uses [facility] on [address]

## 2015-01-03 ENCOUNTER — Ambulatory Visit (INDEPENDENT_AMBULATORY_CARE_PROVIDER_SITE_OTHER): Payer: Medicare Other | Admitting: Family Medicine

## 2015-01-03 ENCOUNTER — Ambulatory Visit
Admission: RE | Admit: 2015-01-03 | Discharge: 2015-01-03 | Disposition: A | Discharge status: Home / Self Care | Payer: 59 | Source: Ambulatory Visit | Attending: Family Medicine | Admitting: Family Medicine

## 2015-01-03 VITALS — BP 110/60 | HR 90 | Temp 97.5°F

## 2015-01-03 DIAGNOSIS — R059 Cough, unspecified: Secondary | ICD-10-CM

## 2015-01-03 DIAGNOSIS — R0600 Dyspnea, unspecified: Secondary | ICD-10-CM

## 2015-01-03 DIAGNOSIS — R05 Cough: Secondary | ICD-10-CM

## 2015-01-03 DIAGNOSIS — R0609 Other forms of dyspnea: Principal | ICD-10-CM

## 2015-01-03 DIAGNOSIS — I2510 Atherosclerotic heart disease of native coronary artery without angina pectoris: Secondary | ICD-10-CM

## 2015-01-03 LAB — COMPREHENSIVE METABOLIC PANEL
ALT: 59 U/L — ABNORMAL HIGH (ref 0–53)
AST: 49 U/L — ABNORMAL HIGH (ref 0–37)
Albumin: 4.4 g/dL (ref 3.5–5.2)
Alkaline Phosphatase: 53 U/L (ref 39–117)
BUN: 21 mg/dL (ref 6–23)
CO2: 30 mEq/L (ref 19–32)
Calcium: 9.9 mg/dL (ref 8.4–10.5)
Chloride: 99 mEq/L (ref 96–112)
Creat: 0.99 mg/dL (ref 0.50–1.35)
Glucose, Bld: 112 mg/dL — ABNORMAL HIGH (ref 70–99)
Potassium: 4.2 mEq/L (ref 3.5–5.3)
Sodium: 140 mEq/L (ref 135–145)
Total Bilirubin: 0.4 mg/dL (ref 0.2–1.2)
Total Protein: 7.6 g/dL (ref 6.0–8.3)

## 2015-01-03 LAB — CBC WITH DIFFERENTIAL/PLATELET
Basophils Absolute: 0 10*3/uL (ref 0.0–0.1)
Basophils Relative: 0 % (ref 0–1)
Eosinophils Absolute: 0.4 10*3/uL (ref 0.0–0.7)
Eosinophils Relative: 5 % (ref 0–5)
HCT: 45.6 % (ref 39.0–52.0)
Hemoglobin: 16.2 g/dL (ref 13.0–17.0)
Lymphocytes Relative: 20 % (ref 12–46)
Lymphs Abs: 1.4 10*3/uL (ref 0.7–4.0)
MCH: 31.3 pg (ref 26.0–34.0)
MCHC: 35.5 g/dL (ref 30.0–36.0)
MCV: 88 fL (ref 78.0–100.0)
MPV: 10.9 fL (ref 8.6–12.4)
Monocytes Absolute: 0.6 10*3/uL (ref 0.1–1.0)
Monocytes Relative: 8 % (ref 3–12)
Neutro Abs: 4.7 10*3/uL (ref 1.7–7.7)
Neutrophils Relative %: 67 % (ref 43–77)
Platelets: 194 10*3/uL (ref 150–400)
RBC: 5.18 MIL/uL (ref 4.22–5.81)
RDW: 14.5 % (ref 11.5–15.5)
WBC: 7 10*3/uL (ref 4.0–10.5)

## 2015-01-03 LAB — BRAIN NATRIURETIC PEPTIDE: Brain Natriuretic Peptide: 6.9 pg/mL (ref 0.0–100.0)

## 2015-01-03 NOTE — Progress Notes (Signed)
   Subjective:    Patient ID: Jimmy [name], male    DOB: 1946/05/20, 69 y.o.   MRN: [id number]  HPI He is here for consult concerning continued difficulty with coughing. He now states that he is also been wheezing for the last several weeks. He has had no more fever, chills, sore throat or earache. He does complain of dyspnea on exertion stating he cannot walk to his mailbox and back without becoming short of breath. He is also had wakening and all night with shortness of breath. He's had no chest pain or complaint of peripheral edema    Review of Systems     Objective:   Physical Exam alert and in no distress. Tympanic membranes and canals are normal. Throat is clear. Tonsils are normal. Neck is supple without adenopathy or thyromegaly. Cardiac exam shows a regular sinus rhythm without murmurs or gallops. Lungs show bilateral basilar rales EKG  shows a first-degree block but no other acute changes       Assessment & Plan:  DOE (dyspnea on exertion) - Plan: DG Chest 2 View, EKG 12-Lead, CBC with Differential, Comprehensive metabolic panel, Brain natriuretic peptide, CANCELED: Brain natriuretic peptide  Cough - Plan: DG Chest 2 View, CBC with Differential, Comprehensive metabolic panel, Brain natriuretic peptide  ASHD (arteriosclerotic heart disease)  his symptoms now seem more cardiac in origin. An x-ray is pending. I will also do stat BNP.

## 2015-01-03 NOTE — Progress Notes (Signed)
Patient oxygen was rechecked near the end of his visit and it was 98 when i first checked his oxygen level it was 92-93

## 2015-01-04 MED ORDER — LEVOFLOXACIN 500 MG PO TABS: 500.0000 mg | ORAL_TABLET | Freq: Every day | ORAL | Status: DC

## 2015-01-04 NOTE — Addendum Note (Signed)
Addended by: [name] on: 01/04/2015 08:34 AM   Modules accepted: Orders

## 2015-01-04 NOTE — Progress Notes (Signed)
   Subjective:    Patient ID: Jimmy [name], male    DOB: Sep 20, 1946, 69 y.o.   MRN: [id number]  HPI    Review of Systems     Objective:   Physical Exam        Assessment & Plan:  The blood work was essentially negative. X-ray did show interstitial lung disease. I will switch him to Levaquin. If this does not help, I will refer to pulmonary. He was informed of this.

## 2015-01-11 ENCOUNTER — Ambulatory Visit: Payer: Medicare Other | Admitting: Family Medicine

## 2015-01-13 ENCOUNTER — Encounter: Payer: Self-pay | Admitting: Family Medicine

## 2015-01-13 ENCOUNTER — Ambulatory Visit (INDEPENDENT_AMBULATORY_CARE_PROVIDER_SITE_OTHER): Payer: Medicare Other | Admitting: Family Medicine

## 2015-01-13 VITALS — BP 128/82 | HR 85 | Temp 97.9°F | Wt 261.0 lb

## 2015-01-13 DIAGNOSIS — E119 Type 2 diabetes mellitus without complications: Secondary | ICD-10-CM

## 2015-01-13 DIAGNOSIS — M25561 Pain in right knee: Secondary | ICD-10-CM

## 2015-01-13 DIAGNOSIS — I2510 Atherosclerotic heart disease of native coronary artery without angina pectoris: Secondary | ICD-10-CM

## 2015-01-13 DIAGNOSIS — E1169 Type 2 diabetes mellitus with other specified complication: Secondary | ICD-10-CM

## 2015-01-13 DIAGNOSIS — E785 Hyperlipidemia, unspecified: Secondary | ICD-10-CM

## 2015-01-13 DIAGNOSIS — I10 Essential (primary) hypertension: Secondary | ICD-10-CM

## 2015-01-13 DIAGNOSIS — I152 Hypertension secondary to endocrine disorders: Secondary | ICD-10-CM

## 2015-01-13 DIAGNOSIS — E1159 Type 2 diabetes mellitus with other circulatory complications: Secondary | ICD-10-CM

## 2015-01-13 LAB — POCT GLYCOSYLATED HEMOGLOBIN (HGB A1C): Hemoglobin A1C: 6.5

## 2015-01-13 NOTE — Progress Notes (Signed)
Subjective:    Jimmy [name] is a 69 y.o. male who presents for follow-up of Type 2 diabetes mellitus.  He also has had difficulty with a ten-day history of right knee pain and swelling. No popping, locking or grinding. No history of injury to his knee.  Home blood sugar records: patient test one time a day  Current symptoms/problems running high Daily foot checks:   Any foot concerns: none Last eye exam:  11/17/13 he will call for an appointment   Medication compliance: Good Current diet: none Current exercise: was walking until mid of dec. Known diabetic complications: impotence and cardiovascular disease Cardiovascular risk factors: advanced age (older than 53 for men, 42 for women), diabetes mellitus, dyslipidemia, hypertension, male gender and obesity (BMI >= 30 kg/m2)   The following portions of the patient's history were reviewed and updated as appropriate: allergies, current medications, past medical history, past social history and problem list.  ROS as in subjective above    Objective:    Wt 261 lb (118.389 kg)   General appearence: alert, no distress, WD/WN Small effusion noted in the right knee. No palpable tenderness to the joint line. McMurray's testing and anterior drawer negative. Medial and lateral collateral ligaments intact.  Lab Review Lab Results  Component Value Date   HGBA1C 6.9 09/10/2014   Lab Results  Component Value Date   CHOL 172 01/07/2014   HDL 45 01/07/2014   LDLCALC 70 01/07/2014   LDLDIRECT 115.5 04/15/2013   TRIG 284* 01/07/2014   CHOLHDL 3.8 01/07/2014   No results found for: Derl Barrow   Chemistry      Component Value Date/Time   NA 140 01/03/2015 1611   K 4.2 01/03/2015 1611   CL 99 01/03/2015 1611   CO2 30 01/03/2015 1611   BUN 21 01/03/2015 1611   CREATININE 0.99 01/03/2015 1611   CREATININE 0.80 03/22/2008 1304      Component Value Date/Time   CALCIUM 9.9 01/03/2015 1611   ALKPHOS 53 01/03/2015 1611   AST 49* 01/03/2015 1611   ALT 59* 01/03/2015 1611   BILITOT 0.4 01/03/2015 1611        Chemistry      Component Value Date/Time   NA 140 01/03/2015 1611   K 4.2 01/03/2015 1611   CL 99 01/03/2015 1611   CO2 30 01/03/2015 1611   BUN 21 01/03/2015 1611   CREATININE 0.99 01/03/2015 1611   CREATININE 0.80 03/22/2008 1304      Component Value Date/Time   CALCIUM 9.9 01/03/2015 1611   ALKPHOS 53 01/03/2015 1611   AST 49* 01/03/2015 1611   ALT 59* 01/03/2015 1611   BILITOT 0.4 01/03/2015 1611           Assessment:  ASHD (arteriosclerotic heart disease)  Hyperlipidemia with target LDL less than 70  Hypertension complicating diabetes  Morbid obesity  Type 2 diabetes mellitus without complication - Plan: POCT glycosylated hemoglobin (Hb A1C)  Right knee pain        Plan:    1.  Rx changes: Conservative care with anti-inflammatory for the knee 2.  Education: Reviewed 'ABCs' of diabetes management (respective goals in parentheses):  A1C (<7), blood pressure (<130/80), and cholesterol (LDL <100). 3.  Compliance at present is estimated to be good. Efforts to improve compliance (if necessary) will be directed at increased exercise. 4. Follow up: 4 months    The exam of the knee is nondiagnostic. I will treat him conservatively but if no improvement, further evaluation  will be needed. I again encouraged him to make further diet and exercise changes to help with his overall health and well-being.

## 2015-01-13 NOTE — Patient Instructions (Addendum)
4 Advil 3 times per day the next 2 weeks. If still having difficulty giving a call Take 2 Prilosec an hour before bedtime and see what that does for the cough

## 2015-02-01 LAB — HM COLONOSCOPY

## 2015-02-07 ENCOUNTER — Other Ambulatory Visit: Payer: Self-pay | Admitting: Family Medicine

## 2015-02-25 ENCOUNTER — Encounter: Payer: Self-pay | Admitting: Internal Medicine

## 2015-03-03 ENCOUNTER — Ambulatory Visit (INDEPENDENT_AMBULATORY_CARE_PROVIDER_SITE_OTHER): Payer: Medicare Other | Admitting: Family Medicine

## 2015-03-03 ENCOUNTER — Encounter: Payer: Self-pay | Admitting: Family Medicine

## 2015-03-03 DIAGNOSIS — R053 Chronic cough: Secondary | ICD-10-CM

## 2015-03-03 DIAGNOSIS — J019 Acute sinusitis, unspecified: Secondary | ICD-10-CM | POA: Diagnosis not present

## 2015-03-03 DIAGNOSIS — R05 Cough: Secondary | ICD-10-CM

## 2015-03-03 MED ORDER — LEVOFLOXACIN 500 MG PO TABS: 500.0000 mg | ORAL_TABLET | Freq: Every day | ORAL | Status: DC

## 2015-03-03 NOTE — Progress Notes (Signed)
   Subjective:    Patient ID: Jimmy [name], male    DOB: 08-07-1946, 69 y.o.   MRN: [id number]  HPI He presents with a 4 week history of purulent nasal drainage and an 11 week history of cough, mostly dry and worse at night. He also has sinus pressure and PND. Denies fever, chills, ST, ear pain, chest pain, or DOE. He has taken 3 courses of antibiotics in past 3 months for cough and reports cough is at least 50 percent improved. He was encouraged to try Prilosec at night for 2 weeks to see if this helped with cough, he did not do this.    Review of Systems  All other systems reviewed and are negative.      Objective:   Physical Exam  Alert and in no distress. Tympanic membranes have mild scarring and canals are normal. Hearing aids noted. Tenderness over sinuses. Throat is clear and erythematous. Tonsils not enlarged. Neck is supple without adenopathy. Cardiac exam shows a regular sinus rhythm without murmurs or gallops. Lungs are clear, no wheezing or rales. Extremities are warm and no edema.       Assessment & Plan:  Morbid obesity  Acute sinusitis, recurrence not specified, unspecified location - Plan: levofloxacin (LEVAQUIN) 500 MG tablet  Chronic cough  Take antibiotic as prescribed and take 2 Prilosec in the evening for 2 weeks to see if this helps with cough. Return in 2 weeks at your regularly scheduled appointment or sooner if needed. He did get some improvement but I think he had a reoccurrence of his sinusitis.

## 2015-03-03 NOTE — Patient Instructions (Signed)
Take the antibiotic and also take 2 Prilosec every night. You dip shit!

## 2015-03-04 ENCOUNTER — Encounter: Payer: Self-pay | Admitting: Family Medicine

## 2015-03-09 ENCOUNTER — Encounter: Payer: Self-pay | Admitting: Internal Medicine

## 2015-03-17 ENCOUNTER — Encounter: Payer: Self-pay | Admitting: Family Medicine

## 2015-03-17 ENCOUNTER — Ambulatory Visit (INDEPENDENT_AMBULATORY_CARE_PROVIDER_SITE_OTHER): Payer: Medicare Other | Admitting: Family Medicine

## 2015-03-17 ENCOUNTER — Ambulatory Visit
Admission: RE | Admit: 2015-03-17 | Discharge: 2015-03-17 | Disposition: A | Discharge status: Home / Self Care | Payer: PRIVATE HEALTH INSURANCE | Source: Ambulatory Visit | Attending: Family Medicine | Admitting: Family Medicine

## 2015-03-17 VITALS — BP 138/70 | HR 94 | Temp 98.3°F | Wt 286.0 lb

## 2015-03-17 DIAGNOSIS — J019 Acute sinusitis, unspecified: Secondary | ICD-10-CM

## 2015-03-17 DIAGNOSIS — R05 Cough: Secondary | ICD-10-CM

## 2015-03-17 DIAGNOSIS — R053 Chronic cough: Secondary | ICD-10-CM

## 2015-03-17 LAB — CBC WITH DIFFERENTIAL/PLATELET
Basophils Absolute: 0 10*3/uL (ref 0.0–0.1)
Basophils Relative: 0 % (ref 0–1)
Eosinophils Absolute: 0.1 10*3/uL (ref 0.0–0.7)
Eosinophils Relative: 2 % (ref 0–5)
HCT: 43.8 % (ref 39.0–52.0)
Hemoglobin: 14.5 g/dL (ref 13.0–17.0)
Lymphocytes Relative: 15 % (ref 12–46)
Lymphs Abs: 0.9 10*3/uL (ref 0.7–4.0)
MCH: 30.5 pg (ref 26.0–34.0)
MCHC: 33.1 g/dL (ref 30.0–36.0)
MCV: 92.2 fL (ref 78.0–100.0)
MPV: 12.1 fL (ref 8.6–12.4)
Monocytes Absolute: 0.6 10*3/uL (ref 0.1–1.0)
Monocytes Relative: 10 % (ref 3–12)
Neutro Abs: 4.3 10*3/uL (ref 1.7–7.7)
Neutrophils Relative %: 73 % (ref 43–77)
Platelets: 148 10*3/uL — ABNORMAL LOW (ref 150–400)
RBC: 4.75 MIL/uL (ref 4.22–5.81)
RDW: 14.6 % (ref 11.5–15.5)
WBC: 5.9 10*3/uL (ref 4.0–10.5)

## 2015-03-17 NOTE — Progress Notes (Signed)
   Subjective:    Patient ID: [name], male    DOB: 02-Jun-1946, 69 y.o.   MRN: [id number]  HPI He continues to have difficulty with nasal congestion, per postnasal drainage, coughing. He has been on 3 different antibiotics in no real improvement in his symptoms. Mostly shortly he was given Levaquin.   Review of Systems     Objective:   Physical Exam Alert and in no distress but slightly toxic appearing.Tympanic membranes and canals are normal. Pharyngeal area is normal. Neck is supple without adenopathy or thyromegaly. Cardiac exam shows a regular sinus rhythm without murmurs or gallops. Lungs are clear to auscultation.        Assessment & Plan:  Acute sinusitis, recurrence not specified, unspecified location - Plan: CBC with Differential/Platelet, CT Maxillofacial WO CM, CANCELED: IR CT Head Ltd  Chronic cough - Plan: CBC with Differential/Platelet, DG Chest 2 View, CANCELED: IR CT Head Ltd I discussed use of steroids however with his underlying diabetes however elected to do this.I will wait on the blood work and x-rays.

## 2015-03-18 ENCOUNTER — Ambulatory Visit
Admission: RE | Admit: 2015-03-18 | Discharge: 2015-03-18 | Disposition: A | Discharge status: Home / Self Care | Payer: PRIVATE HEALTH INSURANCE | Source: Ambulatory Visit | Attending: Family Medicine | Admitting: Family Medicine

## 2015-03-18 DIAGNOSIS — J019 Acute sinusitis, unspecified: Secondary | ICD-10-CM

## 2015-03-21 ENCOUNTER — Other Ambulatory Visit: Payer: Self-pay | Admitting: Family Medicine

## 2015-03-21 MED ORDER — PREDNISONE 10 MG PO KIT: PACK | ORAL | Status: DC

## 2015-03-21 MED ORDER — AMOXICILLIN-POT CLAVULANATE 875-125 MG PO TABS: 1.0000 | ORAL_TABLET | Freq: Two times a day (BID) | ORAL | Status: DC

## 2015-03-23 ENCOUNTER — Telehealth: Payer: Self-pay | Admitting: Family Medicine

## 2015-03-23 NOTE — Telephone Encounter (Signed)
Pt called and states sugar this morning is 279 before food and normally runs 127.  He has been put on steroids and wants to know is the high levels to be expected?  Please call pt [phone]

## 2015-03-29 ENCOUNTER — Other Ambulatory Visit: Payer: Self-pay | Admitting: Family Medicine

## 2015-04-05 ENCOUNTER — Ambulatory Visit (INDEPENDENT_AMBULATORY_CARE_PROVIDER_SITE_OTHER): Payer: Medicare Other | Admitting: Family Medicine

## 2015-04-05 ENCOUNTER — Encounter: Payer: Self-pay | Admitting: Family Medicine

## 2015-04-05 VITALS — BP 116/74 | HR 98 | Temp 98.5°F | Wt 274.0 lb

## 2015-04-05 DIAGNOSIS — J019 Acute sinusitis, unspecified: Secondary | ICD-10-CM

## 2015-04-05 DIAGNOSIS — R053 Chronic cough: Secondary | ICD-10-CM

## 2015-04-05 DIAGNOSIS — R05 Cough: Secondary | ICD-10-CM | POA: Diagnosis not present

## 2015-04-05 MED ORDER — AMOXICILLIN-POT CLAVULANATE 875-125 MG PO TABS: 1.0000 | ORAL_TABLET | Freq: Two times a day (BID) | ORAL | Status: DC

## 2015-04-05 NOTE — Progress Notes (Signed)
   Subjective:    Patient ID: Jimmy [name], male    DOB: Apr 30, 1946, 69 y.o.   MRN: [id number]  HPI He is here for recheck. He recently finished his steroids. He did note an elevation in his blood sugars while he was on the steroids. He states that he is now 65% better still having some difficulty with cough, fatigue and nasal congestion. No fever, chills.   Review of Systems     Objective:   Physical Exam Alert and in no distress. His voice is slightly nasal.Tympanic membranes and canals are normal. Pharyngeal area is normal. Neck is supple without adenopathy or thyromegaly. Cardiac exam shows a regular sinus rhythm without murmurs or gallops. Lungs are clear to auscultation.        Assessment & Plan:  Acute sinusitis, recurrence not specified, unspecified location - Plan: amoxicillin-clavulanate (AUGMENTIN) 875-125 MG per tablet  Chronic cough - Plan: amoxicillin-clavulanate (AUGMENTIN) 875-125 MG per tablet He is to call me at the end of the course of the antibiotic and if still having symptoms, reevaluate for more antibiotic or possible referral to ENT.

## 2015-04-05 NOTE — Patient Instructions (Signed)
Take the rest of the antibiotic and if you're not totally back to normal let me know. I may give you more antibiotic or possible referral to ENT

## 2015-04-18 ENCOUNTER — Telehealth: Payer: Self-pay | Admitting: Internal Medicine

## 2015-04-18 ENCOUNTER — Other Ambulatory Visit: Payer: Self-pay

## 2015-04-18 DIAGNOSIS — R42 Dizziness and giddiness: Secondary | ICD-10-CM

## 2015-04-18 NOTE — Telephone Encounter (Signed)
Pt called stating that his cough is still there and he is having dizziness. Is there something else you can give him. Send to [employer]

## 2015-04-18 NOTE — Telephone Encounter (Signed)
Dr.[name] he has to have office visit to have a work up by you to see patient

## 2015-04-18 NOTE — Telephone Encounter (Signed)
Left get ENT to look at him

## 2015-04-19 NOTE — Telephone Encounter (Signed)
I have called patient left word for word message on [phone] patient has appointment 04/27/15 at 9:30 am needs to  Be there at 9:10 with ins.card copay and med list [facility] [address] I faxed over all info

## 2015-04-21 ENCOUNTER — Ambulatory Visit: Payer: Medicare Other | Admitting: Family Medicine

## 2015-05-17 ENCOUNTER — Encounter: Payer: Self-pay | Admitting: Family Medicine

## 2015-05-17 ENCOUNTER — Ambulatory Visit (INDEPENDENT_AMBULATORY_CARE_PROVIDER_SITE_OTHER): Payer: Medicare Other | Admitting: Family Medicine

## 2015-05-17 VITALS — BP 110/68 | HR 75 | Wt 277.2 lb

## 2015-05-17 DIAGNOSIS — I10 Essential (primary) hypertension: Secondary | ICD-10-CM | POA: Diagnosis not present

## 2015-05-17 DIAGNOSIS — E785 Hyperlipidemia, unspecified: Secondary | ICD-10-CM | POA: Diagnosis not present

## 2015-05-17 DIAGNOSIS — I152 Hypertension secondary to endocrine disorders: Secondary | ICD-10-CM

## 2015-05-17 DIAGNOSIS — E1169 Type 2 diabetes mellitus with other specified complication: Secondary | ICD-10-CM | POA: Diagnosis not present

## 2015-05-17 DIAGNOSIS — E1159 Type 2 diabetes mellitus with other circulatory complications: Secondary | ICD-10-CM

## 2015-05-17 MED ORDER — METFORMIN HCL 1000 MG PO TABS: 1000.0000 mg | ORAL_TABLET | Freq: Two times a day (BID) | ORAL | Status: DC

## 2015-05-17 NOTE — Progress Notes (Signed)
   Subjective:    Patient ID: Jimmy [name], male    DOB: [birth year], 69 y.o.   MRN: [id number]  HPI  He is here for a recheck on his diabetes. He was recently treated with steroids for chronic sinusitis. This does cause his blood sugars to go quite high. Presently he is taking Victoza 1.2 daily in the past his hemoglobin A1c was doing quite well. His diet and exercise are essentially unchanged. He is monitoring his blood sugars and states that they're in the 200s in the morning. He has made some dietary adjustments to help with that. Has a follow-up appointment with ENT. Presently they have him on clindamycin. He has no evidence of depression or cognitive difficulties more history of falls. He keeps himself active although he is retired. He does not have an advanced directive.   Review of Systems     Objective:   Physical Exam Alert and in no distress otherwise not examined. Hemoglobin A1c is 9.2       Assessment & Plan:  Morbid obesity  Hypertension complicating diabetes  Hyperlipidemia with target LDL less than 70  Type 2 diabetes mellitus with other specified complication - Plan: metFORMIN (GLUCOPHAGE) 1000 MG tablet, US Arterial Seg Multiple, CANCELED: ABI He is to increase his Victoza to 1.8, start taking Glucophage. Discussed possible side effects from medication. He will continue to monitor his blood sugars. Again discussed diet and exercise with him. Also discussed advanced directive with him. Discussed the need to make his desires known at put it in writing. Discussed short and long-term issues with this. Gave forms concerning this and MOST. ABIs also ordered.

## 2015-05-18 ENCOUNTER — Ambulatory Visit
Admission: RE | Admit: 2015-05-18 | Discharge: 2015-05-18 | Disposition: A | Discharge status: Home / Self Care | Payer: Medicare Other | Source: Ambulatory Visit | Attending: Family Medicine | Admitting: Family Medicine

## 2015-05-18 DIAGNOSIS — E1169 Type 2 diabetes mellitus with other specified complication: Secondary | ICD-10-CM

## 2015-05-27 ENCOUNTER — Ambulatory Visit: Payer: 59 | Admitting: Cardiology

## 2015-05-31 ENCOUNTER — Encounter: Payer: Self-pay | Admitting: Family Medicine

## 2015-06-27 ENCOUNTER — Other Ambulatory Visit: Payer: Self-pay

## 2015-07-18 ENCOUNTER — Other Ambulatory Visit: Payer: Self-pay | Admitting: Family Medicine

## 2015-08-22 ENCOUNTER — Encounter: Payer: Self-pay | Admitting: Cardiology

## 2015-08-22 ENCOUNTER — Ambulatory Visit (INDEPENDENT_AMBULATORY_CARE_PROVIDER_SITE_OTHER): Payer: Medicare Other | Admitting: Cardiology

## 2015-08-22 VITALS — BP 128/70 | HR 79 | Ht 69.0 in | Wt 283.0 lb

## 2015-08-22 DIAGNOSIS — I2510 Atherosclerotic heart disease of native coronary artery without angina pectoris: Secondary | ICD-10-CM

## 2015-08-22 DIAGNOSIS — E785 Hyperlipidemia, unspecified: Secondary | ICD-10-CM | POA: Diagnosis not present

## 2015-08-22 NOTE — Progress Notes (Signed)
Patient ID: Jimmy [name], male   DOB: Nov 28, 1946, 69 y.o.   MRN: [id number] PCP: Dr. Redmond School  69 yo with history of nonobstructive CAD and possible RCA dissection in 2000 presents for cardiology evaluation.  In general, Mr Jimmy [name] has been doing well.  No exertional chest pain.  He is short of breath if he walks fast up a hill.  He can walk at a normal pace and climb a flight of steps without problems.  He walks 1-1.5 miles daily.  He lives in a camper at [facility] for 2 months in the early fall.  He is retired.  Weight is up 5 lbs since last appointment. He tolerates Livalo without myalgias.     ECG: NSR, 1st degree AV block  Labs (8/12): K 4.1, creatinine 0.94, ALT 65, AST 49, LDL 109, HDL 37, TGs 392 Labs (9/13): K 4.1, creatinine 0.88, AST 52, ALT 66, TGs 365, LDL 84, HDL 35 Labs (1/15): LDL 70, HDL 45, TGs 284, LFTs normal Labs (1/16): BNP 6.9, K 4.2, creatinine 0.94 Labs (3/16): HCT 43.8  PMH: 1. Obesity 2. OSA: Uses CPAP 3. Hyperlipidemia: myalgias with Crestor, fenofibrate, and Lipitor.Tolerates Livalo. 4. Hives with Plavix 5. CAD: Cath in 2000 with nonobstructive disease and concern for possible RCA dissection.  Normal myovew in 3/ll.   6. Prostate cancer s/p seed implantation and radiation.  7. Elevated LFTs: ?NASH.   FH: No premature CAD  SH: Married.  Quit smoking in 2000.  Lives in [place].  2 daughters, 5 grandsons.  Retired Therapist, music at [employer].   Current Outpatient Prescriptions  Medication Sig Dispense Refill  . ASPIRIN PO Take 81 mg by mouth daily.     . B-D UF III MINI PEN NEEDLES 31G X 5 MM MISC TEST DAILY AS DIRECTED 100 each 0  . calcium citrate-vitamin D (CITRACAL+D) 315-200 MG-UNIT per tablet Take 4 tablets by mouth daily.      . cholecalciferol (VITAMIN D) 1000 UNITS tablet Take 2,000 Units by mouth daily.     . Coenzyme Q10 (CO Q 10) 100 MG CAPS Take 1 tablet by mouth daily.    . hydrochlorothiazide (HYDRODIURIL) 25 MG tablet TAKE 1  TABLET EVERY DAY 90 tablet 3  . Magnesium 250 MG TABS Take 200 mg by mouth.     . metFORMIN (GLUCOPHAGE) 1000 MG tablet Take 1 tablet (1,000 mg total) by mouth 2 (two) times daily with a meal. 60 tablet 3  . Multiple Vitamins-Minerals (MULTIVITAMIN WITH MINERALS) tablet Take 1 tablet by mouth daily.      [name] omega-3 acid ethyl esters (LOVAZA) 1 G capsule TAKE 4 CAPSULES EVERY DAY 360 capsule 1  . ONE TOUCH ULTRA TEST test strip TEST DAILY AS DIRECTED 100 each 2  . ONETOUCH DELICA LANCETS 41L MISC TEST ONCE DAILY AS DIRECTED 100 each 2  . Pitavastatin Calcium (LIVALO) 4 MG TABS TAKE 1 TABLET EVERY DAY 90 tablet 3  . VICTOZA 18 MG/3ML SOPN INJECT 1.2 MG INTO THE SKIN DAILY. 27 pen 1   No current facility-administered medications for this visit.    BP 128/70 mmHg  Pulse 79  Ht 5\' 9"  (1.753 m)  Wt 283 lb (128.368 kg)  BMI 41.77 kg/m2 General: NAD, obese Neck: Thick, no JVD, no thyromegaly or thyroid nodule.  Lungs: Clear to auscultation bilaterally with normal respiratory effort. CV: Nondisplaced PMI.  Heart regular S1/S2, no S3/S4, no murmur.  1+ ankle edema.  No carotid bruit.  Normal pedal pulses.  Abdomen: Soft, nontender, no hepatosplenomegaly, no distention.  Neurologic: Alert and oriented x 3.  Psych: Normal affect. Extremities: No clubbing or cyanosis.   Assessment/Plan: 1. CAD: No ischemic symptoms.  Continue ASA 81, statin.  Continue regular exercise. 2. Hyperlipidemia:  Continue Livalo. Check lipids today.  3. Obesity: Needs to lose more weight.  We talked about diet and exercise.  4. HTN: BP controlled on HCTZ.    Loralie Champagne 08/22/2015

## 2015-08-22 NOTE — Patient Instructions (Signed)
Medication Instructions:  None today   Labwork: Your physician recommends that you return for a FASTING lipid profile today.   Testing/Procedures: None today.  Follow-Up: Your physician wants you to follow-up in: 1 year with Dr Aundra Dubin. (August 2017). You will receive a reminder letter in the mail two months in advance. If you don't receive a letter, please call our office to schedule the follow-up appointment.

## 2015-08-23 ENCOUNTER — Encounter: Payer: Self-pay | Admitting: Family Medicine

## 2015-08-23 ENCOUNTER — Ambulatory Visit (INDEPENDENT_AMBULATORY_CARE_PROVIDER_SITE_OTHER): Payer: Medicare Other | Admitting: Family Medicine

## 2015-08-23 VITALS — BP 122/78 | HR 80 | Ht 68.0 in | Wt 282.0 lb

## 2015-08-23 DIAGNOSIS — E118 Type 2 diabetes mellitus with unspecified complications: Secondary | ICD-10-CM

## 2015-08-23 DIAGNOSIS — I152 Hypertension secondary to endocrine disorders: Secondary | ICD-10-CM

## 2015-08-23 DIAGNOSIS — G4730 Sleep apnea, unspecified: Secondary | ICD-10-CM | POA: Diagnosis not present

## 2015-08-23 DIAGNOSIS — I10 Essential (primary) hypertension: Secondary | ICD-10-CM | POA: Diagnosis not present

## 2015-08-23 DIAGNOSIS — E785 Hyperlipidemia, unspecified: Secondary | ICD-10-CM | POA: Diagnosis not present

## 2015-08-23 DIAGNOSIS — Z8546 Personal history of malignant neoplasm of prostate: Secondary | ICD-10-CM

## 2015-08-23 DIAGNOSIS — I2510 Atherosclerotic heart disease of native coronary artery without angina pectoris: Secondary | ICD-10-CM | POA: Diagnosis not present

## 2015-08-23 DIAGNOSIS — E1159 Type 2 diabetes mellitus with other circulatory complications: Secondary | ICD-10-CM

## 2015-08-23 DIAGNOSIS — E1169 Type 2 diabetes mellitus with other specified complication: Secondary | ICD-10-CM | POA: Diagnosis not present

## 2015-08-23 LAB — POCT GLYCOSYLATED HEMOGLOBIN (HGB A1C): Hemoglobin A1C: 6.9

## 2015-08-23 LAB — LDL CHOLESTEROL, DIRECT: Direct LDL: 73 mg/dL

## 2015-08-23 LAB — LIPID PANEL
Cholesterol: 159 mg/dL (ref 0–200)
HDL: 38.1 mg/dL — ABNORMAL LOW (ref >39.00)
Total CHOL/HDL Ratio: 4
Triglycerides: 460 mg/dL — ABNORMAL HIGH (ref 0.0–149.0)

## 2015-08-23 NOTE — Patient Instructions (Signed)
20 minutes of physical activity every day. Edwena Felty or shine. Hot or cold

## 2015-08-23 NOTE — Progress Notes (Signed)
  Subjective:    Patient ID: Jimmy [name], male    DOB: 1946/03/26, 69 y.o.   MRN: [id number]  Jimmy [name] is a 69 y.o. male who presents for follow-up of Type 2 diabetes mellitus.  Home blood sugar records: Patient test one time a day Current symptoms/problems none Daily foot checks:yes   Any foot concerns:none Exercise: walking every other day Eye: nov 2016 He states that he does well with diet and exercise to pounds and then gains it back.He does have underlying OSA but has not had a read out in quite some time.He was diagnosed with prostate cancer and did have seeds. He is seen regularly for follow-up on this.Seen yesterday by cardiology. That note was reviewed. The following portions of the patient's history were reviewed and updated as appropriate: allergies, current medications, past medical history, past social history and problem list.  ROS as in subjective above.     Objective:    Physical Exam Alert and in no distress otherwise not examined.  Lab Review Diabetic Labs Latest Ref Rng 01/13/2015 01/03/2015 09/10/2014 05/07/2014 01/07/2014  HbA1c - 6.5 - 6.9 7.1 6.4  Chol 0 - 200 mg/dL - - - - 172  HDL >39 mg/dL - - - - 45  Calc LDL 0 - 99 mg/dL - - - - 70  Triglycerides <150 mg/dL - - - - 284(H)  Creatinine 0.50 - 1.35 mg/dL - 0.99 - - 0.83   BP/Weight 08/22/2015 05/17/2015 04/05/2015 7/59/1638 04/05/6598  Systolic BP 357 017 793 903 009  Diastolic BP 70 68 74 70 74  Wt. (Lbs) 283 277.2 274 286 273  BMI 41.77 40.92 40.44 42.22 40.3   Foot/eye exam completion dates 01/07/2014 11/17/2013  Eye Exam - no diabetic retinopathy  Foot Form Completion Done -  Hb A1c is 6.9.  Jimmy [name]  reports that he quit smoking about 16 years ago. He does not have any smokeless tobacco history on file. He reports that he drinks about 0.6 oz of alcohol per week. He reports that he does not use illicit drugs.     Assessment & Plan:    ASHD (arteriosclerotic heart disease)  Morbid  obesity  Hypertension complicating diabetes  Hyperlipidemia with target LDL less than 70  Type 2 diabetes mellitus with complication  History of prostate cancer  Sleep apnea   1. Rx changes: none 2. Education: Reviewed 'ABCs' of diabetes management (respective goals in parentheses):  A1C (<7), blood pressure (<130/80), and cholesterol (LDL <100). 3. Compliance at present is estimated to be good. Efforts to improve compliance (if necessary) will be directed at increased exercise. 4. Follow up: 4 months I again stressed the need for him to make permanent lifestyle changes in regard to diet and exercise and strongly encouraged him to NOT use weight as his goal but as a measure of success. Mentioned I would rather have him use his waist size. Encouraged him to do some kind of physical activity on a daily basis. Recheck here in 4 months.

## 2015-08-29 ENCOUNTER — Other Ambulatory Visit: Payer: Self-pay | Admitting: *Deleted

## 2015-08-29 DIAGNOSIS — E785 Hyperlipidemia, unspecified: Secondary | ICD-10-CM

## 2015-08-29 MED ORDER — FENOFIBRATE 48 MG PO TABS: 48.0000 mg | ORAL_TABLET | Freq: Every day | ORAL | Status: DC

## 2015-09-14 ENCOUNTER — Encounter: Payer: Self-pay | Admitting: Family Medicine

## 2015-09-14 ENCOUNTER — Ambulatory Visit (INDEPENDENT_AMBULATORY_CARE_PROVIDER_SITE_OTHER): Payer: Medicare Other | Admitting: Family Medicine

## 2015-09-14 VITALS — BP 124/80 | HR 68 | Temp 98.7°F | Wt 277.8 lb

## 2015-09-14 DIAGNOSIS — B354 Tinea corporis: Secondary | ICD-10-CM

## 2015-09-14 DIAGNOSIS — Z23 Encounter for immunization: Secondary | ICD-10-CM | POA: Diagnosis not present

## 2015-09-14 MED ORDER — TERBINAFINE HCL 1 % EX CREA: 1.0000 "application " | TOPICAL_CREAM | Freq: Two times a day (BID) | CUTANEOUS | Status: DC

## 2015-09-14 NOTE — Progress Notes (Signed)
   Subjective:    Patient ID: Jimmy [name], male    DOB: [birth year], 69 y.o.   MRN: [id number]  HPI  He is here for a red circular rash to his right forearm that he noticed this morning. Denies rash to any other part of his body. Denies history of similar rash. Denies itching. Denies fever, chills.    Review of Systems Pertinent positives and negatives in the history of present illness.    Objective:   Physical Exam Anterior aspect of forearm, multiple discreet lesions with areas of central clearing surrounded by red and scaling elevated borders.       Assessment & Plan:  Tinea corporis  Recommend that he use the Lamisil cream twice daily for the next week and let us know if it's not getting any better or if it gets worse.

## 2015-09-15 ENCOUNTER — Other Ambulatory Visit: Payer: Self-pay | Admitting: Family Medicine

## 2015-09-16 ENCOUNTER — Other Ambulatory Visit: Payer: Self-pay

## 2015-09-16 MED ORDER — METFORMIN HCL 1000 MG PO TABS: ORAL_TABLET | ORAL | Status: DC

## 2015-11-03 ENCOUNTER — Other Ambulatory Visit (INDEPENDENT_AMBULATORY_CARE_PROVIDER_SITE_OTHER): Payer: Medicare Other | Admitting: *Deleted

## 2015-11-03 DIAGNOSIS — E1159 Type 2 diabetes mellitus with other circulatory complications: Secondary | ICD-10-CM | POA: Diagnosis not present

## 2015-11-03 DIAGNOSIS — I2510 Atherosclerotic heart disease of native coronary artery without angina pectoris: Secondary | ICD-10-CM

## 2015-11-03 DIAGNOSIS — E785 Hyperlipidemia, unspecified: Secondary | ICD-10-CM

## 2015-11-03 DIAGNOSIS — I152 Hypertension secondary to endocrine disorders: Secondary | ICD-10-CM

## 2015-11-03 DIAGNOSIS — I10 Essential (primary) hypertension: Secondary | ICD-10-CM

## 2015-11-03 LAB — HEPATIC FUNCTION PANEL
ALT: 73 U/L — ABNORMAL HIGH (ref 9–46)
AST: 68 U/L — ABNORMAL HIGH (ref 10–35)
Albumin: 4.3 g/dL (ref 3.6–5.1)
Alkaline Phosphatase: 42 U/L (ref 40–115)
Bilirubin, Direct: 0.1 mg/dL (ref <=0.2)
Indirect Bilirubin: 0.3 mg/dL (ref 0.2–1.2)
Total Bilirubin: 0.4 mg/dL (ref 0.2–1.2)
Total Protein: 7.5 g/dL (ref 6.1–8.1)

## 2015-11-03 LAB — LIPID PANEL
Cholesterol: 146 mg/dL (ref 125–200)
HDL: 40 mg/dL (ref >=40)
LDL Cholesterol: 44 mg/dL (ref <130)
Total CHOL/HDL Ratio: 3.7 Ratio (ref <=5.0)
Triglycerides: 308 mg/dL — ABNORMAL HIGH (ref <150)
VLDL: 62 mg/dL — ABNORMAL HIGH (ref <30)

## 2015-11-03 NOTE — Addendum Note (Signed)
Addended by: Eulis Foster on: 11/03/2015 08:44 AM   Modules accepted: Orders

## 2015-11-14 ENCOUNTER — Encounter: Payer: Self-pay | Admitting: Family Medicine

## 2015-11-14 LAB — HM DIABETES EYE EXAM

## 2015-11-30 ENCOUNTER — Ambulatory Visit (INDEPENDENT_AMBULATORY_CARE_PROVIDER_SITE_OTHER): Payer: Medicare Other | Admitting: Family Medicine

## 2015-11-30 ENCOUNTER — Encounter: Payer: Self-pay | Admitting: Family Medicine

## 2015-11-30 DIAGNOSIS — I2510 Atherosclerotic heart disease of native coronary artery without angina pectoris: Secondary | ICD-10-CM

## 2015-11-30 DIAGNOSIS — Z23 Encounter for immunization: Secondary | ICD-10-CM | POA: Diagnosis not present

## 2015-11-30 DIAGNOSIS — E1159 Type 2 diabetes mellitus with other circulatory complications: Secondary | ICD-10-CM

## 2015-11-30 DIAGNOSIS — E785 Hyperlipidemia, unspecified: Secondary | ICD-10-CM

## 2015-11-30 DIAGNOSIS — E118 Type 2 diabetes mellitus with unspecified complications: Secondary | ICD-10-CM | POA: Diagnosis not present

## 2015-11-30 DIAGNOSIS — G4730 Sleep apnea, unspecified: Secondary | ICD-10-CM | POA: Diagnosis not present

## 2015-11-30 DIAGNOSIS — I10 Essential (primary) hypertension: Secondary | ICD-10-CM | POA: Diagnosis not present

## 2015-11-30 DIAGNOSIS — I152 Hypertension secondary to endocrine disorders: Secondary | ICD-10-CM

## 2015-11-30 LAB — POCT GLYCOSYLATED HEMOGLOBIN (HGB A1C): Hemoglobin A1C: 6.7

## 2015-11-30 NOTE — Progress Notes (Signed)
  Subjective:    Patient ID: Jimmy [name], male    DOB: [birth year], 69 y.o.   MRN: [id number]  Jimmy [name] is a 69 y.o. male who presents for follow-up of Type 2 diabetes mellitus.  Home blood sugar records: patient test one time a day Current symptoms/problems include none Daily foot checks yes:   Any foot concerns: none Exercise: walking and weights that he does every other day.  Eye: 11/14/15 He does see the nutritionist regularly. He does have underlying sleep apnea and says he had a recent readout however not seen. He has had no difficulty with chest pain, shortness of breath, PND The following portions of the patient's history were reviewed and updated as appropriate: allergies, current medications, past medical history, past social history and problem list.  ROS as in subjective above.     Objective:    Physical Exam Alert and in no distress otherwise not examined.   Lab Review Diabetic Labs Latest Ref Rng 11/03/2015 08/23/2015 08/22/2015 01/13/2015 01/03/2015  HbA1c - - 6.9 - 6.5 -  Chol 125 - 200 mg/dL 146 - 159 - -  HDL >=40 mg/dL 40 - 38.10(L) - -  Calc LDL <130 mg/dL 44 - - - -  Triglycerides <150 mg/dL 308(H) - 460.0 Triglyceride is over 400; calculations on Lipids are invalid.(H) - -  Creatinine 0.50 - 1.35 mg/dL - - - - 0.99   BP/Weight 09/14/2015 08/23/2015 08/22/2015 [id number]  Systolic BP A999333 [id number] 0000000 [id number] [id number]  Diastolic BP 80 78 70 68 74  Wt. (Lbs) 277.8 282 283 277.2 274  BMI 42.25 42.89 41.77 40.92 40.44   Foot/eye exam completion dates Latest Ref Rng 11/14/2015 01/07/2014  Eye Exam No Retinopathy No Retinopathy -  Foot Form Completion - - Done   hemoglobin A1c 6.7  Jimmy [name]  reports that he quit smoking about 16 years ago. He does not have any smokeless tobacco history on file. He reports that he drinks about 0.6 oz of alcohol per week. He reports that he does not use illicit drugs.     Assessment & Plan:    Morbid obesity due to excess calories  ([place])  Type 2 diabetes mellitus with complication, without long-term current use of insulin ([place]) - Plan: POCT glycosylated hemoglobin (Hb A1C)  Hyperlipidemia with target LDL less than 70  Hypertension complicating diabetes ([place])  Sleep apnea  ASHD (arteriosclerotic heart disease)  Need for Tdap vaccination - Plan: Tdap vaccine greater than or equal to 7yo IM   1. Rx changes: none 2. Education: Reviewed 'ABCs' of diabetes management (respective goals in parentheses):  A1C (<7), blood pressure (<130/80), and cholesterol (LDL <100). 3. Compliance at present is estimated to be good. Efforts to improve compliance (if necessary) will be directed at increased exercise. 4. Follow up: 4 months Reinforce the need for him to increase his physical activity daily and also be more vigilant and following the nutritionist recommendations

## 2015-11-30 NOTE — Patient Instructions (Signed)
Walk every day for 20 minutes at least

## 2015-12-06 ENCOUNTER — Other Ambulatory Visit: Payer: Self-pay | Admitting: Family Medicine

## 2015-12-08 ENCOUNTER — Other Ambulatory Visit: Payer: Self-pay | Admitting: Cardiology

## 2015-12-22 ENCOUNTER — Ambulatory Visit: Payer: Medicare Other | Admitting: Family Medicine

## 2015-12-24 ENCOUNTER — Other Ambulatory Visit: Payer: Self-pay | Admitting: Family Medicine

## 2015-12-24 ENCOUNTER — Other Ambulatory Visit: Payer: Self-pay | Admitting: Cardiology

## 2016-01-20 ENCOUNTER — Encounter: Payer: Self-pay | Admitting: Family Medicine

## 2016-01-24 NOTE — Addendum Note (Signed)
Addended by: Minette Headland A on: 01/24/2016 02:58 PM   Modules accepted: Orders

## 2016-01-28 ENCOUNTER — Other Ambulatory Visit: Payer: Self-pay | Admitting: Family Medicine

## 2016-01-30 ENCOUNTER — Other Ambulatory Visit: Payer: Self-pay | Admitting: *Deleted

## 2016-01-30 MED ORDER — GLUCOSE BLOOD VI STRP: ORAL_STRIP | Status: DC

## 2016-01-30 MED ORDER — ONETOUCH DELICA LANCETS 33G MISC: Status: DC

## 2016-02-22 ENCOUNTER — Other Ambulatory Visit: Payer: Self-pay | Admitting: Family Medicine

## 2016-03-28 ENCOUNTER — Other Ambulatory Visit: Payer: Self-pay | Admitting: Family Medicine

## 2016-03-30 ENCOUNTER — Ambulatory Visit
Admission: RE | Admit: 2016-03-30 | Discharge: 2016-03-30 | Disposition: A | Discharge status: Home / Self Care | Payer: Medicare Other | Source: Ambulatory Visit | Attending: Family Medicine | Admitting: Family Medicine

## 2016-03-30 ENCOUNTER — Ambulatory Visit (INDEPENDENT_AMBULATORY_CARE_PROVIDER_SITE_OTHER): Payer: Medicare Other | Admitting: Family Medicine

## 2016-03-30 ENCOUNTER — Encounter: Payer: Self-pay | Admitting: Family Medicine

## 2016-03-30 VITALS — BP 130/72 | HR 82 | Resp 14 | Ht 68.0 in | Wt 274.4 lb

## 2016-03-30 DIAGNOSIS — G4730 Sleep apnea, unspecified: Secondary | ICD-10-CM | POA: Diagnosis not present

## 2016-03-30 DIAGNOSIS — L989 Disorder of the skin and subcutaneous tissue, unspecified: Secondary | ICD-10-CM

## 2016-03-30 DIAGNOSIS — E785 Hyperlipidemia, unspecified: Secondary | ICD-10-CM

## 2016-03-30 DIAGNOSIS — E1159 Type 2 diabetes mellitus with other circulatory complications: Secondary | ICD-10-CM

## 2016-03-30 DIAGNOSIS — I10 Essential (primary) hypertension: Secondary | ICD-10-CM | POA: Diagnosis not present

## 2016-03-30 DIAGNOSIS — M25561 Pain in right knee: Secondary | ICD-10-CM

## 2016-03-30 DIAGNOSIS — E118 Type 2 diabetes mellitus with unspecified complications: Secondary | ICD-10-CM

## 2016-03-30 DIAGNOSIS — Z8546 Personal history of malignant neoplasm of prostate: Secondary | ICD-10-CM

## 2016-03-30 DIAGNOSIS — I2510 Atherosclerotic heart disease of native coronary artery without angina pectoris: Secondary | ICD-10-CM

## 2016-03-30 DIAGNOSIS — I152 Hypertension secondary to endocrine disorders: Secondary | ICD-10-CM

## 2016-03-30 LAB — POCT GLYCOSYLATED HEMOGLOBIN (HGB A1C): Hemoglobin A1C: 6.9

## 2016-03-30 NOTE — Patient Instructions (Signed)
Check it before a meal or 2 hours after meals

## 2016-03-30 NOTE — Progress Notes (Signed)
Subjective:    Patient ID: Jimmy [name], male    DOB: 09-30-1946, 70 y.o.   MRN: [id number]  Jimmy [name] is a 70 y.o. male who presents for follow-up of Type 2 diabetes mellitus.Has an underlying history of prostate cancer and does follow-up yearly with this. He also sees his cardiologist once per year. He does have sleep apnea and has had a read out within the last year. He continues on his medications listed in the chart. He does complain of medial knee pain. He did have one episode that he describes as a locking sensation.He uses an anti-inflammatory several times per week with minimal doses which seems to help.Complains of a dry scaly itchy lesion on the right cheek. Patient is checking home blood sugars.   Home blood sugar records: BGs range between 160 and 190. He is only checking them in the morning. How often is blood sugars being checked: 1a day Current symptoms/problems include none and have been stable. Daily foot checks: yes   Any foot concerns: no Last eye exam: 11/2015 Exercise: Home exercise routine includes walking 6 hrs per week.  The following portions of the patient's history were reviewed and updated as appropriate: allergies, current medications, past medical history, past social history and problem list.  ROS as in subjective above.     Objective:    Physical Exam Alert and in no distress . Exam of the right knee shows no effusion. Negative anterior drawer. McMurray testing negative. Medial and lateral collateral ligaments are intact. X-ray does show some minor degenerative changes. Exam of the right cheek does show a slightly pigmented scaly lesion with central trauma. There were no vitals taken for this visit.  Lab Review Diabetic Labs Latest Ref Rng 11/30/2015 11/03/2015 08/23/2015 08/22/2015 01/13/2015  HbA1c - 6.7 - 6.9 - 6.5  Chol 125 - 200 mg/dL - 146 - 159 -  HDL >=40 mg/dL - 40 - 38.10(L) -  Calc LDL <130 mg/dL - 44 - - -  Triglycerides <150 mg/dL -  308(H) - 460.0 Triglyceride is over 400; calculations on Lipids are invalid.(H) -  Creatinine 0.50 - 1.35 mg/dL - - - - -   BP/Weight 11/30/2015 09/14/2015 08/23/2015 08/22/2015 [id number]  Systolic BP 99991111 A999333 [id number] 0000000 A999333  Diastolic BP 70 80 78 70 68  Wt. (Lbs) 279 277.8 282 283 277.2  BMI 42.43 42.25 42.89 41.77 40.92   Foot/eye exam completion dates Latest Ref Rng 11/14/2015 01/07/2014  Eye Exam No Retinopathy No Retinopathy -  Foot Form Completion - - Done  A1c is 6.9  Jimmy [name]  reports that he quit smoking about 16 years ago. He does not have any smokeless tobacco history on file. He reports that he drinks about 0.6 oz of alcohol per week. He reports that he does not use illicit drugs.     Assessment & Plan:    Type 2 diabetes mellitus with complication, without long-term current use of insulin (HCC) - Plan: POCT glycosylated hemoglobin (Hb A1C)  Hypertension complicating diabetes ([place])  Hyperlipidemia with target LDL less than 70  Sleep apnea  ASHD (arteriosclerotic heart disease)  History of prostate cancer  Right knee pain - Plan: DG Knee Complete 4 Views Right  Facial lesion - Plan: Ambulatory referral to Dermatology   1. Rx changes: none 2. Education: Reviewed 'ABCs' of diabetes management (respective goals in parentheses):  A1C (<7), blood pressure (<130/80), and cholesterol (LDL <100). 3. Compliance at present is estimated to be good. Efforts to  improve compliance (if necessary) will be directed at increased exercise. 4. Follow up: 4 months I again strongly encouraged him to make diet and exercise changes especially regard to helping with his knee pain. He has continued difficulty, further evaluation and treatment including injections might be needed. Discussed cutting back on his carbohydrates.Jimmy [name] and he continue to treat his knee pain periodically since the arthritic changes seen very minimal. If he has continued difficulty, further evaluation and treatment will be  needed.

## 2016-04-02 ENCOUNTER — Telehealth: Payer: Self-pay | Admitting: Family Medicine

## 2016-04-02 MED ORDER — OMEGA-3-ACID ETHYL ESTERS 1 G PO CAPS: ORAL_CAPSULE | ORAL | Status: DC

## 2016-04-02 NOTE — Telephone Encounter (Signed)
Rcvd refill request for Omega 3 Ethyl Esters 1 GM #[id number]

## 2016-04-02 NOTE — Telephone Encounter (Signed)
done

## 2016-06-08 ENCOUNTER — Encounter: Payer: Self-pay | Admitting: Family Medicine

## 2016-06-08 ENCOUNTER — Ambulatory Visit (INDEPENDENT_AMBULATORY_CARE_PROVIDER_SITE_OTHER): Payer: Medicare Other | Admitting: Family Medicine

## 2016-06-08 VITALS — BP 120/80 | HR 80 | Wt 278.0 lb

## 2016-06-08 DIAGNOSIS — Z8546 Personal history of malignant neoplasm of prostate: Secondary | ICD-10-CM

## 2016-06-08 DIAGNOSIS — R319 Hematuria, unspecified: Secondary | ICD-10-CM | POA: Diagnosis not present

## 2016-06-08 LAB — POCT URINALYSIS DIPSTICK
Bilirubin, UA: NEGATIVE
Blood, UA: 2
Glucose, UA: NEGATIVE
Ketones, UA: NEGATIVE
Leukocytes, UA: NEGATIVE
Nitrite, UA: NEGATIVE
Protein, UA: NEGATIVE
Spec Grav, UA: >=1.03
Urobilinogen, UA: NEGATIVE
pH, UA: 5.5

## 2016-06-08 NOTE — Progress Notes (Signed)
   Subjective:    Patient ID: Jimmy [name], male    DOB: 07-19-1946, 70 y.o.   MRN: [id number]  HPI Yesterday he noted seeing frank blood in his urine on 3 separate occasions and slight dysuria. No associated urgency, frequency, discharge, fever, chills, abdominal pain or decrease in stream. He has a previous history of prostate cancer with radiation and seeds. He was seen in May and his PSA was less than 0.01.   Review of Systems     Objective:   Physical Exam Heart and in no distress. Abdominal exam shows no masses or tenderness. Urine microscopic did show occasional red cell.       Assessment & Plan:  Hematuria - Plan: POCT Urinalysis Dipstick  History of prostate cancer  Case was discussed with Dr. Aline August. He states that this could easily be radiation cystitis and he will probably need to be cystoscoped. I will have him set up an appointment.

## 2016-06-18 ENCOUNTER — Other Ambulatory Visit: Payer: Self-pay | Admitting: Family Medicine

## 2016-06-24 ENCOUNTER — Other Ambulatory Visit: Payer: Self-pay | Admitting: Family Medicine

## 2016-07-26 ENCOUNTER — Ambulatory Visit (INDEPENDENT_AMBULATORY_CARE_PROVIDER_SITE_OTHER): Payer: Medicare Other | Admitting: Family Medicine

## 2016-07-26 ENCOUNTER — Encounter: Payer: Self-pay | Admitting: Family Medicine

## 2016-07-26 VITALS — BP 120/78 | HR 80 | Ht 68.0 in | Wt 277.0 lb

## 2016-07-26 DIAGNOSIS — I152 Hypertension secondary to endocrine disorders: Secondary | ICD-10-CM

## 2016-07-26 DIAGNOSIS — Z9119 Patient's noncompliance with other medical treatment and regimen: Secondary | ICD-10-CM

## 2016-07-26 DIAGNOSIS — E785 Hyperlipidemia, unspecified: Secondary | ICD-10-CM

## 2016-07-26 DIAGNOSIS — I10 Essential (primary) hypertension: Secondary | ICD-10-CM

## 2016-07-26 DIAGNOSIS — Z91199 Patient's noncompliance with other medical treatment and regimen due to unspecified reason: Secondary | ICD-10-CM

## 2016-07-26 DIAGNOSIS — E118 Type 2 diabetes mellitus with unspecified complications: Secondary | ICD-10-CM

## 2016-07-26 DIAGNOSIS — E1159 Type 2 diabetes mellitus with other circulatory complications: Secondary | ICD-10-CM | POA: Diagnosis not present

## 2016-07-26 LAB — POCT UA - MICROALBUMIN
Albumin/Creatinine Ratio, Urine, POC: 7.3
Creatinine, POC: 241.8 mg/dL
Microalbumin Ur, POC: 17.7 mg/L

## 2016-07-26 LAB — POCT GLYCOSYLATED HEMOGLOBIN (HGB A1C): Hemoglobin A1C: 7.7

## 2016-07-26 NOTE — Patient Instructions (Addendum)
20 minutes daily of something physical. Get off your dead ass!!

## 2016-07-26 NOTE — Progress Notes (Signed)
  Subjective:    Patient ID: Jimmy [name], male    DOB: 25-Mar-1946, 70 y.o.   MRN: [id number]  Jimmy [name] is a 70 y.o. male who presents for follow-up of Type 2 diabetes mellitus.  Patient is checking home blood sugars.   Home blood sugar records: 160 to 263 How often is blood sugars being checked: when he feels like it Current symptoms/problems B/S higher in the mornings Daily foot checks: yes   Any foot concerns: none Last eye exam: 11/14/15 Exercise: not yet He continues on his big toes as well as metformin. He also is taking Livalo for his cholesterol. He is on multiple other OTC medications. The following portions of the patient's history were reviewed and updated as appropriate: allergies, current medications, past medical history, past social history and problem list.  ROS as in subjective above.     Objective:    Physical Exam Alert and in no distress otherwise not examined.   Lab Review Diabetic Labs Latest Ref Rng & Units 03/30/2016 11/30/2015 11/03/2015 08/23/2015 08/22/2015  HbA1c - 6.9 6.7 - 6.9 -  Chol 125 - 200 mg/dL - - 146 - 159  HDL >=40 mg/dL - - 40 - 38.10(L)  Calc LDL <130 mg/dL - - 44 - -  Triglycerides <150 mg/dL - - 308(H) - 460.0 Triglyceride is over 400; calculations on Lipids are invalid.(H)  Creatinine 0.50 - 1.35 mg/dL - - - - -   BP/Weight 06/08/2016 03/30/2016 11/30/2015 09/14/2015 [id number]  Systolic BP [id number] A999333 [id number]  Diastolic BP 80 72 70 80 78  Wt. (Lbs) 278 274.4 279 277.8 282  BMI 42.28 41.73 42.43 42.25 42.89   Foot/eye exam completion dates Latest Ref Rng & Units 11/14/2015 01/07/2014  Eye Exam No Retinopathy No Retinopathy -  Foot Form Completion - - Done  A1C 7.7  Jumar  reports that he quit smoking about 16 years ago. He does not have any smokeless tobacco history on file. He reports that he drinks about 0.6 oz of alcohol per week . He reports that he does not use drugs.     Assessment & Plan:    Personal history of  noncompliance with medical treatment, presenting hazards to health  Type 2 diabetes mellitus with complication, without long-term current use of insulin ([place])  Hypertension complicating diabetes ([place])  Hyperlipidemia with target LDL less than 70  Morbid obesity due to excess calories ([place])   1. Rx changes: none 2. Education: Reviewed 'ABCs' of diabetes management (respective goals in parentheses):  A1C (<7), blood pressure (<130/80), and cholesterol (LDL <100). 3. Compliance at present is estimated to be poor. Efforts to improve compliance (if necessary) will be directed at increased exercise. 4. Follow up: 4 months I discussed the fact that his A1c is going in the wrong direction. I again strongly encouraged him to become more physically active. He so far has been essentially resistant to making any major lifestyle changes in that regard. Discussed the fact with his next visit if his A1c remains elevated, I will need to add another medication to his regimen.

## 2016-08-20 ENCOUNTER — Other Ambulatory Visit: Payer: Self-pay | Admitting: Family Medicine

## 2016-08-20 ENCOUNTER — Other Ambulatory Visit: Payer: Self-pay | Admitting: Cardiology

## 2016-09-06 ENCOUNTER — Other Ambulatory Visit (INDEPENDENT_AMBULATORY_CARE_PROVIDER_SITE_OTHER): Payer: Medicare Other

## 2016-09-06 DIAGNOSIS — Z23 Encounter for immunization: Secondary | ICD-10-CM

## 2016-10-04 ENCOUNTER — Other Ambulatory Visit: Payer: Self-pay | Admitting: Family Medicine

## 2016-11-01 DIAGNOSIS — H25013 Cortical age-related cataract, bilateral: Secondary | ICD-10-CM

## 2016-11-01 DIAGNOSIS — H2513 Age-related nuclear cataract, bilateral: Secondary | ICD-10-CM

## 2016-11-01 DIAGNOSIS — H52203 Unspecified astigmatism, bilateral: Secondary | ICD-10-CM

## 2016-11-01 HISTORY — DX: Unspecified astigmatism, bilateral: H52.203

## 2016-11-01 HISTORY — DX: Age-related nuclear cataract, bilateral: H25.13

## 2016-11-01 HISTORY — DX: Cortical age-related cataract, bilateral: H25.013

## 2016-11-01 LAB — HM DIABETES EYE EXAM

## 2016-11-05 ENCOUNTER — Encounter: Payer: Self-pay | Admitting: Cardiology

## 2016-11-05 ENCOUNTER — Ambulatory Visit (INDEPENDENT_AMBULATORY_CARE_PROVIDER_SITE_OTHER): Payer: Medicare Other | Admitting: Cardiology

## 2016-11-05 VITALS — BP 126/72 | HR 99 | Ht 68.0 in | Wt 271.0 lb

## 2016-11-05 DIAGNOSIS — I10 Essential (primary) hypertension: Secondary | ICD-10-CM

## 2016-11-05 DIAGNOSIS — I152 Hypertension secondary to endocrine disorders: Secondary | ICD-10-CM

## 2016-11-05 DIAGNOSIS — E1159 Type 2 diabetes mellitus with other circulatory complications: Secondary | ICD-10-CM | POA: Diagnosis not present

## 2016-11-05 DIAGNOSIS — E782 Mixed hyperlipidemia: Secondary | ICD-10-CM

## 2016-11-05 DIAGNOSIS — I2510 Atherosclerotic heart disease of native coronary artery without angina pectoris: Secondary | ICD-10-CM

## 2016-11-05 MED ORDER — RAMIPRIL 5 MG PO CAPS: 5.0000 mg | ORAL_CAPSULE | Freq: Every day | ORAL | 3 refills | Status: DC

## 2016-11-05 NOTE — Patient Instructions (Signed)
Medication Instructions:  Stop HCTZ (hydrochlorothiazide).   Start ramipril 5mg  daily  Labwork: Your physician recommends that you have  lab work today--Lipid profile/BMET.  Your physician recommends that you return for lab work in: 2 weeks--BMET. This is scheduled for Monday November 20,2017. The lab is open from 7:30AM-5PM.   Testing/Procedures: none  Follow-Up: Your physician wants you to follow-up in: 1 year with Dr End. (November 2018). You will receive a reminder letter in the mail two months in advance. If you don't receive a letter, please call our office to schedule the follow-up appointment.        If you need a refill on your cardiac medications before your next appointment, please call your pharmacy.

## 2016-11-06 ENCOUNTER — Encounter: Payer: Self-pay | Admitting: Family Medicine

## 2016-11-06 LAB — LIPID PANEL
Cholesterol: 159 mg/dL (ref <200)
HDL: 36 mg/dL — ABNORMAL LOW (ref >40)
Total CHOL/HDL Ratio: 4.4 Ratio (ref <5.0)
Triglycerides: 497 mg/dL — ABNORMAL HIGH (ref <150)

## 2016-11-06 LAB — BASIC METABOLIC PANEL
BUN: 21 mg/dL (ref 7–25)
CO2: 26 mmol/L (ref 20–31)
Calcium: 10 mg/dL (ref 8.6–10.3)
Chloride: 104 mmol/L (ref 98–110)
Creat: 1.2 mg/dL — ABNORMAL HIGH (ref 0.70–1.18)
Glucose, Bld: 126 mg/dL — ABNORMAL HIGH (ref 65–99)
Potassium: 4.1 mmol/L (ref 3.5–5.3)
Sodium: 143 mmol/L (ref 135–146)

## 2016-11-06 NOTE — Progress Notes (Signed)
Patient ID: Jimmy [name], male   DOB: [birth year], 70 y.o.   MRN: [id number] PCP: Dr. Redmond School  70 yo with history of nonobstructive CAD and possible RCA dissection in 2000 presents for cardiology evaluation.  In general, Jimmy [name] has been doing well.  No exertional chest pain.  He is short of breath if he walks fast up a hill.  He can walk at a normal pace and climb a flight of steps without problems.  He walks 1-1.5 miles daily.  He lives in a camper at [facility] for 2 months in the early fall.  He is retired.  Weight is down 12 lbs since last appointment. He tolerates Livalo without myalgias.  He has been diagnosed with type II diabetes.   ECG: NSR, normal  Labs (8/12): K 4.1, creatinine 0.94, ALT 65, AST 49, LDL 109, HDL 37, TGs 392 Labs (9/13): K 4.1, creatinine 0.88, AST 52, ALT 66, TGs 365, LDL 84, HDL 35 Labs (1/15): LDL 70, HDL 45, TGs 284, LFTs normal Labs (1/16): BNP 6.9, K 4.2, creatinine 0.94 Labs (3/16): HCT 43.8 Labs (11/16): LDL 44, HDl 40, TGs 308  PMH: 1. Obesity 2. OSA: Uses CPAP 3. Hyperlipidemia: myalgias with Crestor, fenofibrate, and Lipitor.Tolerates Livalo. 4. Hives with Plavix 5. CAD: Cath in 2000 with nonobstructive disease and concern for possible RCA dissection.  Normal myovew in 3/ll.   6. Prostate cancer s/p seed implantation and radiation.  7. Elevated LFTs: ?NASH.  8. Diabetes mellitus  FH: No premature CAD  SH: Married.  Quit smoking in 2000.  Lives in [place].  2 daughters, 5 grandsons.  Retired Therapist, music at [employer].   ROS: All systems reviewed and negative except as per HPI.   Current Outpatient Prescriptions  Medication Sig Dispense Refill  . ASPIRIN PO Take 81 mg by mouth daily.     . B-D UF III MINI PEN NEEDLES 31G X 5 MM MISC TEST DAILY AS DIRECTED 100 each 1  . calcium citrate-vitamin D (CITRACAL+D) 315-200 MG-UNIT per tablet Take 4 tablets by mouth daily.      . cholecalciferol (VITAMIN D) 1000 UNITS tablet Take 2,000  Units by mouth daily.     . Coenzyme Q10 (CO Q 10) 100 MG CAPS Take 1 tablet by mouth daily.    . fenofibrate (TRICOR) 48 MG tablet TAKE 1 TABLET (48 MG TOTAL) BY MOUTH DAILY. 90 tablet 1  . LIVALO 4 MG TABS TAKE 1 TABLET EVERY DAY 90 tablet 1  . Magnesium 250 MG TABS Take 200 mg by mouth.     . metFORMIN (GLUCOPHAGE) 1000 MG tablet TAKE 1 TABLET BY MOUTH TWICE A DAY WITH A MEAL 180 tablet 1  . Multiple Vitamins-Minerals (MULTIVITAMIN WITH MINERALS) tablet Take 1 tablet by mouth daily.      [name] omega-3 acid ethyl esters (LOVAZA) 1 g capsule TAKE 4 CAPSULES BY MOUTH EVERY DAY 360 capsule 1  . ONE TOUCH ULTRA TEST test strip TEST DAILY AS DIRECTED 100 each 2  . ONETOUCH DELICA LANCETS 99991111 MISC TEST ONCE DAILY AS DIRECTED 100 each 2  . VICTOZA 18 MG/3ML SOPN INJECT 1.2 MG INTO THE SKIN DAILY. 27 pen 1  . ramipril (ALTACE) 5 MG capsule Take 1 capsule (5 mg total) by mouth daily. 90 capsule 3   No current facility-administered medications for this visit.     BP 126/72   Pulse 99   Ht 5\' 8"  (1.727 m)   Wt 271 lb (122.9 kg)  BMI 41.21 kg/m  General: NAD, obese Neck: Thick, no JVD, no thyromegaly or thyroid nodule.  Lungs: Clear to auscultation bilaterally with normal respiratory effort. CV: Nondisplaced PMI.  Heart regular S1/S2, no S3/S4, no murmur.  1+ edema 1/2 up lower legs bilaterally.  No carotid bruit.  Normal pedal pulses.  Abdomen: Soft, nontender, no hepatosplenomegaly, no distention.  Neurologic: Alert and oriented x 3.  Psych: Normal affect. Extremities: No clubbing or cyanosis.   Assessment/Plan: 1. CAD: No ischemic symptoms.  Continue ASA 81, statin.  Continue regular exercise. 2. Hyperlipidemia:  Continue Livalo. Check lipids today.  3. Obesity: Weight is trending down, continue efforts.  4. HTN: With diagnosis of diabetes and known coronary disease, he should be on an ACEI.  - Stop HCTZ. - Start ramipril 5 mg daily.  BMET in 2 wks.  Given my transition to CHF clinic, he  will see Jimmy [name] in 1 year.     Loralie Champagne 11/06/2016

## 2016-11-07 ENCOUNTER — Other Ambulatory Visit: Payer: Self-pay | Admitting: *Deleted

## 2016-11-07 DIAGNOSIS — I2510 Atherosclerotic heart disease of native coronary artery without angina pectoris: Secondary | ICD-10-CM

## 2016-11-07 DIAGNOSIS — E785 Hyperlipidemia, unspecified: Secondary | ICD-10-CM

## 2016-11-07 MED ORDER — FENOFIBRATE 160 MG PO TABS: 160.0000 mg | ORAL_TABLET | Freq: Every day | ORAL | 1 refills | Status: DC

## 2016-11-19 ENCOUNTER — Other Ambulatory Visit: Payer: Medicare Other | Admitting: *Deleted

## 2016-11-19 DIAGNOSIS — I10 Essential (primary) hypertension: Principal | ICD-10-CM

## 2016-11-19 DIAGNOSIS — I2510 Atherosclerotic heart disease of native coronary artery without angina pectoris: Secondary | ICD-10-CM

## 2016-11-19 DIAGNOSIS — I152 Hypertension secondary to endocrine disorders: Secondary | ICD-10-CM

## 2016-11-19 DIAGNOSIS — E1159 Type 2 diabetes mellitus with other circulatory complications: Secondary | ICD-10-CM

## 2016-11-19 LAB — BASIC METABOLIC PANEL
BUN: 20 mg/dL (ref 7–25)
CO2: 26 mmol/L (ref 20–31)
Calcium: 9.4 mg/dL (ref 8.6–10.3)
Chloride: 107 mmol/L (ref 98–110)
Creat: 0.89 mg/dL (ref 0.70–1.18)
Glucose, Bld: 112 mg/dL — ABNORMAL HIGH (ref 65–99)
Potassium: 4.3 mmol/L (ref 3.5–5.3)
Sodium: 139 mmol/L (ref 135–146)

## 2016-11-20 ENCOUNTER — Other Ambulatory Visit: Payer: Self-pay | Admitting: Family Medicine

## 2016-11-28 ENCOUNTER — Encounter: Payer: Self-pay | Admitting: Family Medicine

## 2016-11-28 ENCOUNTER — Ambulatory Visit (INDEPENDENT_AMBULATORY_CARE_PROVIDER_SITE_OTHER): Payer: Medicare Other | Admitting: Family Medicine

## 2016-11-28 VITALS — BP 140/80 | HR 76 | Resp 18 | Wt 266.8 lb

## 2016-11-28 DIAGNOSIS — I10 Essential (primary) hypertension: Secondary | ICD-10-CM | POA: Diagnosis not present

## 2016-11-28 DIAGNOSIS — E1159 Type 2 diabetes mellitus with other circulatory complications: Secondary | ICD-10-CM | POA: Diagnosis not present

## 2016-11-28 DIAGNOSIS — E785 Hyperlipidemia, unspecified: Secondary | ICD-10-CM

## 2016-11-28 DIAGNOSIS — I152 Hypertension secondary to endocrine disorders: Secondary | ICD-10-CM

## 2016-11-28 DIAGNOSIS — E118 Type 2 diabetes mellitus with unspecified complications: Secondary | ICD-10-CM | POA: Diagnosis not present

## 2016-11-28 LAB — POCT GLYCOSYLATED HEMOGLOBIN (HGB A1C): Hemoglobin A1C: 7

## 2016-11-28 NOTE — Patient Instructions (Signed)
Just recognizes that lazy means I don't want to live Need to have an attitude of I like me and I like living

## 2016-11-28 NOTE — Progress Notes (Signed)
  Subjective:    Patient ID: Jimmy [name], male    DOB: 08-Mar-1946, 70 y.o.   MRN: [id number]  HENRIQUE KIRNER is a 70 y.o. male who presents for follow-up of Type 2 diabetes mellitus.  Patient is checking home blood sugars.   Home blood sugar records: BGs have been labile ranging between 129 and 148 How often is blood sugars being checked: every day  Current symptoms/problems include none and have been stable. Daily foot checks: no- once weekly   Any foot concerns: none Last eye exam: month ago  Exercise: The patient does not participate in regular exercise at present.  He does deer hunt and walks up to 10 miles in 1 day.  The following portions of the patient's history were reviewed and updated as appropriate: allergies, current medications, past medical history, past social history and problem list.  ROS as in subjective above.     Objective:    Physical Exam Alert and in no distress otherwise not examined.  Blood pressure 140/80, pulse 76, resp. rate 18, weight 266 lb 12.8 oz (121 kg), SpO2 97 %.  Lab Review Diabetic Labs Latest Ref Rng & Units 11/28/2016 11/19/2016 11/05/2016 07/26/2016 03/30/2016  HbA1c - 7.0 - - 7.7 6.9  Microalbumin mg/L - - - 17.7 -  Micro/Creat Ratio - - - - 7.3 -  Chol <200 mg/dL - - 159 - -  HDL >40 mg/dL - - 36(L) - -  Calc LDL mg/dL - - NOT CALC - -  Triglycerides <150 mg/dL - - 497(H) - -  Creatinine 0.70 - 1.18 mg/dL - 0.89 1.20(H) - -   BP/Weight 11/28/2016 11/05/2016 07/26/2016 06/08/2016 [id number]  Systolic BP [id number] [id number]  Diastolic BP 80 72 78 80 72  Wt. (Lbs) 266.8 271 277 278 274.4  BMI 40.57 41.21 42.12 42.28 41.73   Foot/eye exam completion dates Latest Ref Rng & Units 11/01/2016 11/14/2015  Eye Exam No Retinopathy No Retinopathy No Retinopathy  Foot Form Completion - - -   Ranulfo  reports that he quit smoking about 17 years ago. He does not have any smokeless tobacco history on file. He reports that he drinks about 0.6 oz of  alcohol per week . He reports that he does not use drugs.  A1c is 7.0    Assessment & Plan:    Diabetic complication ([place]) - Plan: HgB A1c  Type 2 diabetes mellitus with complication, without long-term current use of insulin (HCC)  Hypertension complicating diabetes ([place])  Hyperlipidemia with target LDL less than 70  Morbid obesity due to excess calories [facility])   Rx changes: none  Education: Reviewed 'ABCs' of diabetes management (respective goals in parentheses):  A1C (<7), blood pressure (<130/80), and cholesterol (LDL <100).  Compliance at present is estimated to be fair. Efforts to improve compliance (if necessary) will be directed at increased exercise.  Follow up: 4 months  Although his A1c is quite good, I again discussed the need for him to make further dietary adjustments and make sure he exercises on a regular basis rather than episodically throughout the year. I also discussed the need for him to make further dietary changes especially in regard to carbohydrates.

## 2017-01-09 ENCOUNTER — Other Ambulatory Visit: Payer: Medicare Other

## 2017-01-11 ENCOUNTER — Other Ambulatory Visit: Payer: Self-pay | Admitting: Family Medicine

## 2017-01-11 NOTE — Telephone Encounter (Signed)
Is this okay to refill?, last filled June 2017

## 2017-01-16 ENCOUNTER — Other Ambulatory Visit: Payer: Medicare Other

## 2017-01-21 ENCOUNTER — Other Ambulatory Visit: Payer: Medicare Other | Admitting: *Deleted

## 2017-01-21 DIAGNOSIS — I152 Hypertension secondary to endocrine disorders: Secondary | ICD-10-CM

## 2017-01-21 DIAGNOSIS — E1159 Type 2 diabetes mellitus with other circulatory complications: Secondary | ICD-10-CM

## 2017-01-21 DIAGNOSIS — I10 Essential (primary) hypertension: Principal | ICD-10-CM

## 2017-01-21 DIAGNOSIS — E785 Hyperlipidemia, unspecified: Secondary | ICD-10-CM

## 2017-01-22 ENCOUNTER — Telehealth: Payer: Self-pay | Admitting: *Deleted

## 2017-01-22 DIAGNOSIS — I2510 Atherosclerotic heart disease of native coronary artery without angina pectoris: Secondary | ICD-10-CM

## 2017-01-22 DIAGNOSIS — E785 Hyperlipidemia, unspecified: Secondary | ICD-10-CM

## 2017-01-22 LAB — LIPID PANEL
Chol/HDL Ratio: 5.2 ratio units — ABNORMAL HIGH (ref 0.0–5.0)
Cholesterol, Total: 198 mg/dL (ref 100–199)
HDL: 38 mg/dL — ABNORMAL LOW (ref >39)
LDL Calculated: 102 mg/dL — ABNORMAL HIGH (ref 0–99)
Triglycerides: 288 mg/dL — ABNORMAL HIGH (ref 0–149)
VLDL Cholesterol Cal: 58 mg/dL — ABNORMAL HIGH (ref 5–40)

## 2017-01-22 MED ORDER — EZETIMIBE 10 MG PO TABS: 10.0000 mg | ORAL_TABLET | Freq: Every day | ORAL | 0 refills | Status: DC

## 2017-01-22 NOTE — Telephone Encounter (Signed)
Orders placed for medication and lab

## 2017-02-09 ENCOUNTER — Other Ambulatory Visit: Payer: Self-pay | Admitting: Family Medicine

## 2017-03-04 ENCOUNTER — Other Ambulatory Visit: Payer: Self-pay | Admitting: Family Medicine

## 2017-03-05 ENCOUNTER — Telehealth: Payer: Self-pay | Admitting: Family Medicine

## 2017-03-05 NOTE — Telephone Encounter (Signed)
Wife called and having same issue with the pharmacy request coming to [place].  She states pharmacy sent request the week before 03/04/17 through surescripts for Victoza.  We never received.  We will add his name to the IT tickets for repair.  Wife will call us directly for RX request until issues resolved.

## 2017-03-20 ENCOUNTER — Other Ambulatory Visit: Payer: Medicare Other | Admitting: *Deleted

## 2017-03-20 DIAGNOSIS — E785 Hyperlipidemia, unspecified: Secondary | ICD-10-CM

## 2017-03-20 DIAGNOSIS — I2510 Atherosclerotic heart disease of native coronary artery without angina pectoris: Secondary | ICD-10-CM

## 2017-03-20 LAB — LIPID PANEL
Chol/HDL Ratio: 3.2 ratio units (ref 0.0–5.0)
Cholesterol, Total: 149 mg/dL (ref 100–199)
HDL: 47 mg/dL (ref >39)
LDL Calculated: 52 mg/dL (ref 0–99)
Triglycerides: 250 mg/dL — ABNORMAL HIGH (ref 0–149)
VLDL Cholesterol Cal: 50 mg/dL — ABNORMAL HIGH (ref 5–40)

## 2017-03-20 LAB — HEPATIC FUNCTION PANEL
ALT: 45 IU/L — ABNORMAL HIGH (ref 0–44)
AST: 48 IU/L — ABNORMAL HIGH (ref 0–40)
Albumin: 4.6 g/dL (ref 3.5–4.8)
Alkaline Phosphatase: 41 IU/L (ref 39–117)
Bilirubin Total: 0.3 mg/dL (ref 0.0–1.2)
Bilirubin, Direct: 0.11 mg/dL (ref 0.00–0.40)
Total Protein: 7.6 g/dL (ref 6.0–8.5)

## 2017-03-21 ENCOUNTER — Telehealth: Payer: Self-pay | Admitting: Pediatrics

## 2017-03-21 DIAGNOSIS — I2510 Atherosclerotic heart disease of native coronary artery without angina pectoris: Secondary | ICD-10-CM

## 2017-03-21 DIAGNOSIS — E785 Hyperlipidemia, unspecified: Secondary | ICD-10-CM

## 2017-03-21 MED ORDER — EZETIMIBE 10 MG PO TABS: 10.0000 mg | ORAL_TABLET | Freq: Every day | ORAL | 3 refills | Status: DC

## 2017-03-21 NOTE — Telephone Encounter (Signed)
I spoke with patient in regards to recent lipid results.  He states he would like refill on Zetia.  Please advise. Pt recommended to transfer care to Dr. Saunders Revel, has not seen him yet.

## 2017-03-21 NOTE — Telephone Encounter (Signed)
-----   Message from Larey Dresser, MD sent at 03/20/2017  9:54 PM EDT ----- Good lipids, LFTs lower than prior.

## 2017-03-21 NOTE — Telephone Encounter (Signed)
OK to refill

## 2017-03-21 NOTE — Telephone Encounter (Signed)
Thanks! Medication sent to patient's pharmacy.

## 2017-03-27 ENCOUNTER — Encounter: Payer: Self-pay | Admitting: Family Medicine

## 2017-03-27 ENCOUNTER — Ambulatory Visit (INDEPENDENT_AMBULATORY_CARE_PROVIDER_SITE_OTHER): Payer: Medicare Other | Admitting: Family Medicine

## 2017-03-27 DIAGNOSIS — G4730 Sleep apnea, unspecified: Secondary | ICD-10-CM | POA: Diagnosis not present

## 2017-03-27 DIAGNOSIS — E118 Type 2 diabetes mellitus with unspecified complications: Secondary | ICD-10-CM

## 2017-03-27 DIAGNOSIS — E1159 Type 2 diabetes mellitus with other circulatory complications: Secondary | ICD-10-CM

## 2017-03-27 DIAGNOSIS — I10 Essential (primary) hypertension: Secondary | ICD-10-CM | POA: Diagnosis not present

## 2017-03-27 DIAGNOSIS — T387X5A Adverse effect of androgens and anabolic congeners, initial encounter: Secondary | ICD-10-CM | POA: Diagnosis not present

## 2017-03-27 DIAGNOSIS — Z8546 Personal history of malignant neoplasm of prostate: Secondary | ICD-10-CM | POA: Diagnosis not present

## 2017-03-27 DIAGNOSIS — I152 Hypertension secondary to endocrine disorders: Secondary | ICD-10-CM

## 2017-03-27 DIAGNOSIS — E785 Hyperlipidemia, unspecified: Secondary | ICD-10-CM

## 2017-03-27 DIAGNOSIS — M818 Other osteoporosis without current pathological fracture: Secondary | ICD-10-CM

## 2017-03-27 DIAGNOSIS — I2510 Atherosclerotic heart disease of native coronary artery without angina pectoris: Secondary | ICD-10-CM

## 2017-03-27 LAB — POCT GLYCOSYLATED HEMOGLOBIN (HGB A1C): Hemoglobin A1C: 6.7

## 2017-03-27 NOTE — Progress Notes (Signed)
Subjective:    Patient ID: Jimmy [name], male    DOB: 1946-10-30, 71 y.o.   MRN: [id number]  Jimmy [name] is a 71 y.o. male who presents for follow-up of Type 2 diabetes mellitus.  Home blood sugar records  136 to 171 Patient test once a day Current symptoms/problems None Daily foot checks: yes   Any foot concerns: every once in a while feels needles in right foot Exercise: Walking Eye:11/01/16 Diet:semi diabetic diet He continues on Pitocin, metformin and having no difficulty with him. Also takes all takes for his blood pressure. He continues on fenofibrate, Zetia, Livalo and having no aches and pains with that. Continues on CPAP without difficulty. He was seen by cardiology in November for coronary artery disease and is stable. No chest pain, shortness of breath, PND. He does have a previous history of osteoporosis secondary to hormonal manipulation for treatment of underlying prostate cancer and apparently was on Prolia  given to him by urology. States that he did have a follow-up DEXA scan which was normal. The following portions of the patient's history were reviewed and updated as appropriate: allergies, current medications, past medical history, past social history and problem list.  ROS as in subjective above.     Objective:    Physical Exam Alert and in no distress otherwise not examined.   Lab Review Diabetic Labs Latest Ref Rng & Units 03/20/2017 01/21/2017 11/28/2016 11/19/2016 11/05/2016  HbA1c - - - 7.0 - -  Microalbumin mg/L - - - - -  Micro/Creat Ratio - - - - - -  Chol 100 - 199 mg/dL 149 198 - - 159  HDL >39 mg/dL 47 38(L) - - 36(L)  Calc LDL 0 - 99 mg/dL 52 102(H) - - NOT CALC  Triglycerides 0 - 149 mg/dL 250(H) 288(H) - - 497(H)  Creatinine 0.70 - 1.18 mg/dL - - - 0.89 1.20(H)   BP/Weight 11/28/2016 11/05/2016 07/26/2016 06/08/2016 0/53/9767  Systolic BP 341 937 902 409 735  Diastolic BP 80 72 78 80 72  Wt. (Lbs) 266.8 271 277 278 274.4  BMI 40.57 41.21 42.12  42.28 41.73   Foot/eye exam completion dates Latest Ref Rng & Units 11/01/2016 11/14/2015  Eye Exam No Retinopathy No Retinopathy No Retinopathy  Foot Form Completion - - -   A1c is 6.7 Jimmy [name]  reports that he quit smoking about 17 years ago. He does not have any smokeless tobacco history on file. He reports that he drinks about 0.6 oz of alcohol per week . He reports that he does not use drugs.     Assessment & Plan:    Morbid obesity ([place])  Osteoporosis due to androgen therapy  Coronary artery disease involving native heart without angina pectoris, unspecified vessel or lesion type  History of prostate cancer  Hypertension complicating diabetes ([place])  Hyperlipidemia with target LDL less than 70  Type 2 diabetes mellitus with complication, without long-term current use of insulin (HCC)  Sleep apnea, unspecified type Stable on the present medication regimen and therapies for the above diagnoses.  1. Rx changes: none 2. Education: Reviewed 'ABCs' of diabetes management (respective goals in parentheses):  A1C (<7), blood pressure (<130/80), and cholesterol (LDL <100). 3. Compliance at present is estimated to be fair. Efforts to improve compliance (if necessary) will be directed at increased exercise. 4. Follow up: 4 months Scuffs the tingling sensation in his feet and again encouraged him to visually inspect his feet regularly. Discussed possible treatment of this when  it becomes more of an issue. Again reinforced the need for him to make further dietary and exercise changes in spite of his good hemoglobin A1c.

## 2017-04-07 ENCOUNTER — Other Ambulatory Visit: Payer: Self-pay | Admitting: Family Medicine

## 2017-04-30 ENCOUNTER — Other Ambulatory Visit: Payer: Self-pay | Admitting: Family Medicine

## 2017-05-28 ENCOUNTER — Ambulatory Visit (INDEPENDENT_AMBULATORY_CARE_PROVIDER_SITE_OTHER): Payer: Medicare Other | Admitting: Family Medicine

## 2017-05-28 ENCOUNTER — Other Ambulatory Visit: Payer: Self-pay | Admitting: Cardiology

## 2017-05-28 ENCOUNTER — Other Ambulatory Visit: Payer: Self-pay | Admitting: Family Medicine

## 2017-05-28 ENCOUNTER — Encounter: Payer: Self-pay | Admitting: Family Medicine

## 2017-05-28 VITALS — BP 120/70 | HR 93 | Ht 68.0 in | Wt 267.0 lb

## 2017-05-28 DIAGNOSIS — E785 Hyperlipidemia, unspecified: Secondary | ICD-10-CM

## 2017-05-28 DIAGNOSIS — Z77018 Contact with and (suspected) exposure to other hazardous metals: Secondary | ICD-10-CM

## 2017-05-28 DIAGNOSIS — R519 Headache, unspecified: Secondary | ICD-10-CM

## 2017-05-28 DIAGNOSIS — R51 Headache: Secondary | ICD-10-CM

## 2017-05-28 DIAGNOSIS — I2510 Atherosclerotic heart disease of native coronary artery without angina pectoris: Secondary | ICD-10-CM

## 2017-05-28 NOTE — Progress Notes (Signed)
Subjective:     Patient ID: Jimmy [name], male   DOB: 1946/10/22, 71 y.o.   MRN: [id number]  HPI Josean Lycan is a 71 year old male with a past medical history of CAD, morbid obesity, prostate cancer, sleep apnea, HTN, hyperlipidemia, osteoporosis, and type II diabetes who presents today with a 1 month history of headaches on the back left side of his head. He describes the pain as dull and aching. The headache would come and go when the symptoms first started, but for the past few weeks the headache has been constant. The pain has woken him up from sleep at least once a night every day for the past week. He doesn't have any changes in balance, memory, vision, hearing, taste, strength, numbness, tingling, or appetite. He hasn't had runny nose, watery eyes, nausea, vomiting, fever, chills, joint or bone pain. He has tried advil for pain relief, but this hasn't been helpful. He has never experienced headaches before. He doesn't currently smoke, but has a prior history of smoking with a 40 pack year history. He doesn't use any recreational drugs or drink any alcohol. He has a history of prostate cancer and was seen by his urologist who confirmed his PSA was normal and he was still 9 years cancer free.   Review of Systems Pertinent positives and negatives included in the subjective above.    Objective:   Physical Exam Alert and in no distress. Ears were without erythema and TMs were normal without bulging bilaterally. Throat was normal. Nasal mucosa normal Head was nontender to palpation and without trauma. Eyes had EOMI with PERRL bilaterally. The left pupil was noted to be slightly larger than the right. On fundoscopy exam, there was no notable papilledema. Cranial nerves II through XII were intact.  Sensation grossly intact bilaterally. Strength grossly intact bilaterally. Cerebellar tests including romberg, heel to shin test, finger to nose testing was all normal.  Reflexes were normal in  the upper and lower extremities bilaterally. Heart was regular rate and rhythm without murmurs, rubs, or gallops. Lungs were clear to auscultation    Assessment and Plan:     New onset of headaches after age 42 - Plan: MR Brain W Wo Contrast  1. Headache: His presentation is concerning because he is waking up at night with headaches and he has never experienced headaches before. Given these red flags, we want to get an MRI of the brain to evaluate for any possible lesions or masses. We encouraged the patient to take Advil for the pain in the meantime.  Patient seen and evaluated by me JL. Patient was seen in conjunction with Dr. Redmond School. [place] Calix, MS3

## 2017-06-04 ENCOUNTER — Ambulatory Visit
Admission: RE | Admit: 2017-06-04 | Discharge: 2017-06-04 | Disposition: A | Discharge status: Home / Self Care | Payer: Medicare Other | Source: Ambulatory Visit | Attending: Family Medicine | Admitting: Family Medicine

## 2017-06-04 DIAGNOSIS — R51 Headache: Principal | ICD-10-CM

## 2017-06-04 DIAGNOSIS — Z77018 Contact with and (suspected) exposure to other hazardous metals: Secondary | ICD-10-CM

## 2017-06-04 DIAGNOSIS — R519 Headache, unspecified: Secondary | ICD-10-CM

## 2017-06-04 MED ORDER — GADOBENATE DIMEGLUMINE 529 MG/ML IV SOLN
20.0000 mL | Freq: Once | INTRAVENOUS | Status: AC | PRN
  Administered 2017-06-04: 20 mL via INTRAVENOUS

## 2017-06-07 ENCOUNTER — Ambulatory Visit (INDEPENDENT_AMBULATORY_CARE_PROVIDER_SITE_OTHER): Payer: Medicare Other | Admitting: Family Medicine

## 2017-06-07 DIAGNOSIS — M5481 Occipital neuralgia: Secondary | ICD-10-CM

## 2017-06-07 NOTE — Progress Notes (Signed)
   Subjective:    Patient ID: Jimmy [name], male    DOB: 1946-05-29, 71 y.o.   MRN: [id number]  HPI He is here for recheck after recent MRI showed no major issues going on he now states that his pain is mainly in the left occipital area and is now tender to palpation in that area. No fever, chills, blurred or double vision.   Review of Systems     Objective:   Physical Exam Alert and in no distress. Slight tenderness to palpation in the left occipital area.       Assessment & Plan:  Occipital neuralgia of left side Recommend conservative care with heat, stretching and anti-inflammatory. If continued difficulty we'll refer to neurology for probable injections. He is comfortable with that approach.

## 2017-06-07 NOTE — Patient Instructions (Signed)
Heat for 20 minutes 3 times per day and gentle stretching after that. 2 Aleve twice per day doing this for the next week and if he still having trouble than on the refer you to neurology

## 2017-07-29 ENCOUNTER — Encounter: Payer: Self-pay | Admitting: Family Medicine

## 2017-07-29 ENCOUNTER — Other Ambulatory Visit: Payer: Self-pay | Admitting: Family Medicine

## 2017-07-29 ENCOUNTER — Ambulatory Visit (INDEPENDENT_AMBULATORY_CARE_PROVIDER_SITE_OTHER): Payer: Medicare Other | Admitting: Family Medicine

## 2017-07-29 VITALS — BP 126/86 | HR 76 | Ht 68.0 in | Wt 272.0 lb

## 2017-07-29 DIAGNOSIS — I152 Hypertension secondary to endocrine disorders: Secondary | ICD-10-CM

## 2017-07-29 DIAGNOSIS — E785 Hyperlipidemia, unspecified: Secondary | ICD-10-CM | POA: Diagnosis not present

## 2017-07-29 DIAGNOSIS — I10 Essential (primary) hypertension: Secondary | ICD-10-CM | POA: Diagnosis not present

## 2017-07-29 DIAGNOSIS — E1159 Type 2 diabetes mellitus with other circulatory complications: Secondary | ICD-10-CM | POA: Diagnosis not present

## 2017-07-29 DIAGNOSIS — I2510 Atherosclerotic heart disease of native coronary artery without angina pectoris: Secondary | ICD-10-CM | POA: Diagnosis not present

## 2017-07-29 DIAGNOSIS — E118 Type 2 diabetes mellitus with unspecified complications: Secondary | ICD-10-CM | POA: Diagnosis not present

## 2017-07-29 LAB — POCT UA - MICROALBUMIN
Albumin/Creatinine Ratio, Urine, POC: 7.1
Creatinine, POC: 229.4 mg/dL
Microalbumin Ur, POC: 16.2 mg/L

## 2017-07-29 LAB — POCT GLYCOSYLATED HEMOGLOBIN (HGB A1C): Hemoglobin A1C: 6.7

## 2017-07-29 NOTE — Progress Notes (Signed)
  Subjective:    Patient ID: Jimmy [name], male    DOB: 02/27/1946, 71 y.o.   MRN: [id number]  Jimmy [name] is a 71 y.o. male who presents for follow-up of Type 2 diabetes mellitus.  Patient is checking home blood sugars.   Home blood sugar records: 130 to 185 How often is blood sugars being checked: once a day Current symptoms/problems None Daily foot checks: yes  Any foot concerns: None Last eye exam: 11/01/16 Exercise: some walking going to gym after hunting season. He admits to eating more carbs that he should. He continues on metformin twice per day. He is also taking Victoza. He is doing well on Livalo not having any aches or pains and also continues on Zetia. Continues on Altace for his blood pressure . He is scheduled to see cardiology in late November or December. He has had no chest pain, shortness of breath, PND or DOE.  The following portions of the patient's history were reviewed and updated as appropriate: allergies, current medications, past medical history, past social history and problem list.  ROS as in subjective above.     Objective:    Physical Exam Alert and in no distress otherwise not examined.   Lab Review Diabetic Labs Latest Ref Rng & Units 07/29/2017 03/27/2017 03/20/2017 01/21/2017 11/28/2016  HbA1c - 6.7 6.7 - - 7.0  Microalbumin mg/L 16.2 - - - -  Micro/Creat Ratio - 7.1 - - - -  Chol 100 - 199 mg/dL - - 149 198 -  HDL >39 mg/dL - - 47 38(L) -  Calc LDL 0 - 99 mg/dL - - 52 102(H) -  Triglycerides 0 - 149 mg/dL - - 250(H) 288(H) -  Creatinine 0.70 - 1.18 mg/dL - - - - -   BP/Weight 07/29/2017 05/28/2017 03/27/2017 11/28/2016 65/05/8126  Systolic BP 517 001 749 449 675  Diastolic BP 86 70 70 80 72  Wt. (Lbs) 272 267 267 266.8 271  BMI 41.36 40.6 40.6 40.57 41.21   Foot/eye exam completion dates Latest Ref Rng & Units 11/01/2016 11/14/2015  Eye Exam No Retinopathy No Retinopathy No Retinopathy  Foot Form Completion - - -  A1c is 6.7  Jimmy [name]   reports that he quit smoking about 17 years ago. He has never used smokeless tobacco. He reports that he drinks about 0.6 oz of alcohol per week . He reports that he does not use drugs.     Assessment & Plan:    Type 2 diabetes mellitus with complication, without long-term current use of insulin (HCC) - Plan: HgB A1c, POCT UA - Microalbumin  Morbid obesity (HCC)  Coronary artery disease involving native heart without angina pectoris, unspecified vessel or lesion type  Hypertension complicating diabetes ([place])  Hyperlipidemia with target LDL less than 70  1. Rx changes: none 2. Education: Reviewed 'ABCs' of diabetes management (respective goals in parentheses):  A1C (<7), blood pressure (<130/80), and cholesterol (LDL <100). 3. Compliance at present is estimated to be good. Efforts to improve compliance (if necessary) will be directed at increased exercise. Discussed increasing his physical activity to 20 minutes everyday of something. I also discussed dietary modification. He admits to taking an carbs. I strongly encouraged him to cut back on that and increase fruits and vegetables as well as lean meats. 4. Follow up: 4 months

## 2017-08-22 ENCOUNTER — Other Ambulatory Visit: Payer: Self-pay | Admitting: Family Medicine

## 2017-08-22 ENCOUNTER — Telehealth: Payer: Self-pay | Admitting: Family Medicine

## 2017-08-22 NOTE — Telephone Encounter (Signed)
Wife called and left message time for Victoza refill.  Please respond to Mickel Baas

## 2017-08-23 NOTE — Telephone Encounter (Signed)
Called [facility] this morning Victoza was ordered & hadn't came in yet.  Called back and RX was recv'd and went thru insurance for $70.  Called wife and informed.

## 2017-08-23 NOTE — Telephone Encounter (Signed)
Okay to renew

## 2017-08-30 ENCOUNTER — Other Ambulatory Visit (INDEPENDENT_AMBULATORY_CARE_PROVIDER_SITE_OTHER): Payer: Medicare Other

## 2017-08-30 DIAGNOSIS — Z23 Encounter for immunization: Secondary | ICD-10-CM | POA: Diagnosis not present

## 2017-10-27 ENCOUNTER — Other Ambulatory Visit: Payer: Self-pay | Admitting: Family Medicine

## 2017-11-05 ENCOUNTER — Other Ambulatory Visit: Payer: Self-pay | Admitting: Cardiology

## 2017-11-05 ENCOUNTER — Other Ambulatory Visit: Payer: Self-pay | Admitting: Family Medicine

## 2017-11-05 ENCOUNTER — Telehealth: Payer: Self-pay | Admitting: Family Medicine

## 2017-11-05 DIAGNOSIS — I2510 Atherosclerotic heart disease of native coronary artery without angina pectoris: Secondary | ICD-10-CM

## 2017-11-05 DIAGNOSIS — I10 Essential (primary) hypertension: Principal | ICD-10-CM

## 2017-11-05 DIAGNOSIS — I152 Hypertension secondary to endocrine disorders: Secondary | ICD-10-CM

## 2017-11-05 DIAGNOSIS — E1159 Type 2 diabetes mellitus with other circulatory complications: Secondary | ICD-10-CM

## 2017-11-05 NOTE — Telephone Encounter (Signed)
[facility] called requesting a new One Touch Ultra meter for pt since his meter is not working well

## 2017-11-06 ENCOUNTER — Other Ambulatory Visit: Payer: Self-pay | Admitting: Family Medicine

## 2017-11-06 MED ORDER — BLOOD GLUCOSE METER KIT: PACK | 0 refills | Status: DC

## 2017-11-06 NOTE — Telephone Encounter (Signed)
rx printed and placed at front desk for pick up. Victorino December

## 2017-11-26 ENCOUNTER — Ambulatory Visit: Payer: Medicare Other | Admitting: Family Medicine

## 2017-11-26 VITALS — BP 110/60 | HR 89 | Temp 97.7°F | Resp 18 | Wt 267.2 lb

## 2017-11-26 DIAGNOSIS — E118 Type 2 diabetes mellitus with unspecified complications: Secondary | ICD-10-CM | POA: Diagnosis not present

## 2017-11-26 DIAGNOSIS — L600 Ingrowing nail: Secondary | ICD-10-CM

## 2017-11-26 MED ORDER — CEPHALEXIN 500 MG PO CAPS: 500.0000 mg | ORAL_CAPSULE | Freq: Three times a day (TID) | ORAL | 0 refills | Status: DC

## 2017-11-26 NOTE — Progress Notes (Signed)
   Subjective:    Patient ID: Jimmy [name], male    DOB: 28-Feb-1946, 71 y.o.   MRN: [id number]  HPI He stubbed his toe several days ago sustaining an injury to his left great toe and nail.  Since then it has been swollen he states that he has some tenderness as well as slight drainage. He does have underlying diabetes.  Review of Systems     Objective:   Physical Exam Left great toe is slightly swollen red and tender over the lateral distal nail bed.       Assessment & Plan:  Ingrown nail of great toe of left foot - Plan: Ambulatory referral to Podiatry, cephALEXin (KEFLEX) 500 MG capsule  Type 2 diabetes mellitus with complication, without long-term current use of insulin ([place]) I explained that since he has underlying diabetes, I would like to refer him to podiatry.  We will also place him on an antibiotic to be on the safe side.

## 2017-11-27 ENCOUNTER — Telehealth: Payer: Self-pay | Admitting: Family Medicine

## 2017-11-27 NOTE — Telephone Encounter (Signed)
Switch him to doxycycline 100 mg 1 twice a day. See if you can get him in to see the podiatrist sooner

## 2017-11-27 NOTE — Telephone Encounter (Signed)
[name] called and stated that the rx is was put on yesterday is giving Jimmy [name] terrible diarrhea. It is uncontrollable. She needs him switched to something else. She states it is also draining puss and she would like to referral process sped up if possible. Please send [facility] to [facility] [address]

## 2017-11-28 ENCOUNTER — Ambulatory Visit: Payer: Medicare Other | Admitting: Podiatry

## 2017-11-28 ENCOUNTER — Other Ambulatory Visit: Payer: Self-pay | Admitting: Cardiology

## 2017-11-28 DIAGNOSIS — I2510 Atherosclerotic heart disease of native coronary artery without angina pectoris: Secondary | ICD-10-CM

## 2017-11-28 DIAGNOSIS — I739 Peripheral vascular disease, unspecified: Secondary | ICD-10-CM

## 2017-11-28 DIAGNOSIS — L600 Ingrowing nail: Secondary | ICD-10-CM

## 2017-11-28 DIAGNOSIS — E785 Hyperlipidemia, unspecified: Secondary | ICD-10-CM

## 2017-11-28 MED ORDER — DOXYCYCLINE HYCLATE 100 MG PO TABS: 100.0000 mg | ORAL_TABLET | Freq: Two times a day (BID) | ORAL | 0 refills | Status: DC

## 2017-11-28 NOTE — Progress Notes (Signed)
Subjective:   Patient ID: Jimmy [name], male   DOB: 71 y.o.   MRN: [id number]   HPI 71 year old male presents the office today for concerns of ingrown toenail to the left big toenail, the lateral aspect which is been ongoing for several weeks.  His wife states that the area is infected and she had noticed pus coming from the toenail corner although minimal.  He states the pain has improved.  He was on antibiotic however it was causing diarrhea so he stopped it he is picking up a new antibiotic today.  He is diabetic his last A1c was 7.1.  He denies any claudication symptoms.  He denies any numbness or tingling.  He said the toenail is tender to touch.  He has no other concerns today.   Review of Systems  All other systems reviewed and are negative.  Past Medical History:  Diagnosis Date  . Astigmatism of both eyes 11/01/2016  . Bronchitis   . Cortical age-related cataract of both eyes 11/01/2016  . Drug-induced osteoporosis   . Dyslipidemia   . Fatigue   . Fatty liver   . Hypercholesteremia   . Hypogonadism male   . 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 Normal nuclear stress test March 2011  . Nuclear sclerotic cataract of both eyes 11/01/2016  . Obesity   . Prostate cancer [facility])    s/p seed implant and radiation  . Sleep apnea   . Vitamin D insufficiency     Past Surgical History:  Procedure Laterality Date  . CARDIAC CATHETERIZATION  April 2000  . RADIOACTIVE SEED IMPLANT  2009   Implanted radiation seeds     Current Outpatient Medications:  .  ASPIRIN PO, Take 81 mg by mouth daily. , Disp: , Rfl:  .  B-D UF III MINI PEN NEEDLES 31G X 5 MM MISC, USE AS DIRECTED EVERY DAY, Disp: 100 each, Rfl: 1 .  blood glucose meter kit and supplies, Dispense One touch ultra meter. E11.9, Disp: 1 each, Rfl: 0 .  calcium citrate-vitamin D (CITRACAL+D) 315-200 MG-UNIT per tablet, Take 4 tablets by  mouth daily.  , Disp: , Rfl:  .  cephALEXin (KEFLEX) 500 MG capsule, Take 1 capsule (500 mg total) by mouth 3 (three) times daily., Disp: 21 capsule, Rfl: 0 .  cholecalciferol (VITAMIN D) 1000 UNITS tablet, Take 2,000 Units by mouth daily. , Disp: , Rfl:  .  Coenzyme Q10 (CO Q 10) 100 MG CAPS, Take 1 tablet by mouth daily., Disp: , Rfl:  .  doxycycline (VIBRA-TABS) 100 MG tablet, Take 1 tablet (100 mg total) by mouth 2 (two) times daily., Disp: 20 tablet, Rfl: 0 .  ezetimibe (ZETIA) 10 MG tablet, Take 1 tablet (10 mg total) by mouth daily., Disp: 90 tablet, Rfl: 3 .  fenofibrate 160 MG tablet, Take 1 tablet (160 mg total) by mouth daily. Please keep upcoming appt  In December for future refills. Thank you, Disp: 90 tablet, Rfl: 0 .  LIVALO 4 MG TABS, TAKE 1 TABLET BY MOUTH EVERY DAY, Disp: 90 tablet, Rfl: 3 .  Magnesium 250 MG TABS, Take 200 mg by mouth. , Disp: , Rfl:  .  metFORMIN (GLUCOPHAGE) 1000 MG tablet, TAKE 1 TABLET BY MOUTH TWICE A DAY WITH A MEAL, Disp: 180 tablet, Rfl: 1 .  Multiple Vitamins-Minerals (MULTIVITAMIN WITH MINERALS) tablet, Take 1 tablet by mouth  daily.  , Disp: , Rfl:  .  omega-3 acid ethyl esters (LOVAZA) 1 g capsule, TAKE 4 CAPSULES BY MOUTH EVERY DAY, Disp: 360 capsule, Rfl: 1 .  ONE TOUCH ULTRA TEST test strip, USE TO TEST BLOOD SUGAR DAILY AS DIRECTED BY DOCTOR, Disp: 100 each, Rfl: 3 .  ONETOUCH DELICA LANCETS 63Z MISC, TEST ONCE DAILY AS DIRECTED, Disp: 100 each, Rfl: 2 .  ONETOUCH DELICA LANCETS 85Y MISC, TEST ONCE DAILY AS DIRECTED, Disp: 100 each, Rfl: 2 .  ramipril (ALTACE) 5 MG capsule, Take 1 capsule (5 mg total) daily by mouth. Please keep upcoming appt for future refills. Thank you, Disp: 90 capsule, Rfl: 0 .  VICTOZA 18 MG/3ML SOPN, INJECT 1.2 MG INTO THE SKIN DAILY., Disp: 18 pen, Rfl: 1  Allergies  Allergen Reactions  . Crestor [Rosuvastatin Calcium]   . Lipitor [Atorvastatin Calcium] Other (See Comments)    MUSCLE PAINS   . Plavix [Clopidogrel  Bisulfate] Hives    Social History   Socioeconomic History  . Marital status: Married    Spouse name: Not on file  . Number of children: Not on file  . Years of education: Not on file  . Highest education level: Not on file  Social Needs  . Financial resource strain: Not on file  . Food insecurity - worry: Not on file  . Food insecurity - inability: Not on file  . Transportation needs - medical: Not on file  . Transportation needs - non-medical: Not on file  Occupational History  . Not on file  Tobacco Use  . Smoking status: Former Smoker    Last attempt to quit: 08/20/1999    Years since quitting: 18.2  . Smokeless tobacco: Never Used  Substance and Sexual Activity  . Alcohol use: Yes    Alcohol/week: 0.6 oz    Types: 1 Shots of liquor per week  . Drug use: No  . Sexual activity: Not Currently  Other Topics Concern  . Not on file  Social History Narrative  . Not on file          Objective:  Physical Exam  General: AAO x3, NAD  Dermatological: There is induration present along the lateral aspect the left hallux toenail with tenderness palpation.  There is localized edema and erythema localized along the lateral aspect of the nail corner there is no ascending cellulitis.  There is no fluctuance or crepitus.  There is no malodor.  There is no open lesions or pre-ulcerative lesions identified today.  Vascular: DP pulses 2/4 bilaterally, PT pulse mildly decreased compared to the DP.  An ABI was performed in the office today which revealed ABI on the left is 1.24 and the right was 1.2.  Refill time less than 3 seconds.  Neruologic: Grossly intact via light touch bilateral. Vibratory intact via tuning fork bilateral. Protective threshold with Semmes Wienstein monofilament intact to all pedal sites bilateral.   Musculoskeletal: No gross boney pedal deformities bilateral. No pain, crepitus, or limitation noted with foot and ankle range of motion bilateral. Muscular strength  5/5 in all gro  ups tested bilateral.  Gait: Unassisted, Nonantalgic.       Assessment:   71 year old male left lateral hallux ingrown toenail     Plan:  -Treatment options discussed including all alternatives, risks, and complications -Etiology of symptoms were discussed -  An ABI was performed the office to ensure healing.  See above. -At this time, the patient is requesting partial nail removal with chemical  matricectomy to the symptomatic portion of the nail. Risks and complications were discussed with the patient for which they understand and  written consent was obtained. Under sterile conditions a total of 3 mL of a mixture of 2% lidocaine plain and 0.5% Marcaine plain was infiltrated in a hallux block fashion. Once anesthetized, the skin was prepped in sterile fashion. A tourniquet was then applied. Next the lateral aspect of hallux nail border was then sharply excised making sure to remove the entire offending nail border. Once the [name] were ensured to be removed area was debrided and the underlying skin was intact. There is no purulence identified in the procedure. Next phenol was then applied under standard conditions and copiously irrigated. Silvadene was applied. A dry sterile dressing was applied. After application of the dressing the tourniquet was removed and there is found to be an immediate capillary refill time to the digit. The patient tolerated the procedure well any complications. Post procedure instructions were discussed the patient for which he verbally understood. Follow-up in one week for nail check or sooner if any problems are to arise. Discussed signs/symptoms of infection and directed to call the office immediately should any occur or go directly to the emergency room. In the meantime, encouraged to call the office with any questions, concerns, changes symptoms. Finished course of antibiotics-  Trula Slade DPM

## 2017-11-28 NOTE — Patient Instructions (Signed)

## 2017-11-28 NOTE — Telephone Encounter (Signed)
Pt aware of ABX change. Wife reports foot and ankle called at 4:45PM to schedule 9:00AM today. Jimmy [name]

## 2017-12-02 ENCOUNTER — Encounter: Payer: Self-pay | Admitting: Family Medicine

## 2017-12-02 ENCOUNTER — Telehealth: Payer: Self-pay | Admitting: Family Medicine

## 2017-12-02 ENCOUNTER — Ambulatory Visit: Payer: Medicare Other | Admitting: Family Medicine

## 2017-12-02 VITALS — BP 132/70 | HR 79 | Resp 16 | Wt 262.0 lb

## 2017-12-02 DIAGNOSIS — I10 Essential (primary) hypertension: Secondary | ICD-10-CM

## 2017-12-02 DIAGNOSIS — I152 Hypertension secondary to endocrine disorders: Secondary | ICD-10-CM

## 2017-12-02 DIAGNOSIS — E1159 Type 2 diabetes mellitus with other circulatory complications: Secondary | ICD-10-CM | POA: Diagnosis not present

## 2017-12-02 DIAGNOSIS — I2510 Atherosclerotic heart disease of native coronary artery without angina pectoris: Secondary | ICD-10-CM

## 2017-12-02 DIAGNOSIS — E118 Type 2 diabetes mellitus with unspecified complications: Secondary | ICD-10-CM

## 2017-12-02 DIAGNOSIS — Z8546 Personal history of malignant neoplasm of prostate: Secondary | ICD-10-CM | POA: Diagnosis not present

## 2017-12-02 DIAGNOSIS — E785 Hyperlipidemia, unspecified: Secondary | ICD-10-CM

## 2017-12-02 LAB — POCT GLYCOSYLATED HEMOGLOBIN (HGB A1C): Hemoglobin A1C: 6.7

## 2017-12-02 NOTE — Patient Instructions (Signed)
20 minutes of something physical every day 

## 2017-12-02 NOTE — Progress Notes (Signed)
  Subjective:    Patient ID: Jimmy [name], male    DOB: [birth year], 71 y.o.   MRN: [id number]  Jimmy [name] is a 71 y.o. male who presents for follow-up of Type 2 diabetes mellitus.  Patient is checking home blood sugars.   Home blood sugar records: BGs range between 139 and 162 How often is blood sugars being checked: once daily Current symptoms/problems include none and have been unchanged. Daily foot checks: yes   Any foot concerns: recently had toenail removed left great toe.  Last eye exam: Oct 2017- has appt in Jan 2018 Exercise: Walking every other day. He continues on metformin and Victoza.  He is also taking Altace for his blood pressure.  He is taking Livalo and fenofibrate for his cholesterol.  He continues to do well on his CPAP for his OSA.  He does have a remote history of prostate cancer but is now greater than 10 years since his diagnosis.  His immunizations are up-to-date The following portions of the patient's history were reviewed and updated as appropriate: allergies, current medications, past medical history, past social history and problem list.  ROS as in subjective above.     Objective:    Physical Exam Alert and in no distress otherwise not examined.   Lab Review Diabetic Labs Latest Ref Rng & Units 07/29/2017 03/27/2017 03/20/2017 01/21/2017 11/28/2016  HbA1c - 6.7 6.7 - - 7.0  Microalbumin mg/L 16.2 - - - -  Micro/Creat Ratio - 7.1 - - - -  Chol 100 - 199 mg/dL - - 149 198 -  HDL >39 mg/dL - - 47 38(L) -  Calc LDL 0 - 99 mg/dL - - 52 102(H) -  Triglycerides 0 - 149 mg/dL - - 250(H) 288(H) -  Creatinine 0.70 - 1.18 mg/dL - - - - -   BP/Weight 11/26/2017 07/29/2017 05/28/2017 03/27/2017 35/57/3220  Systolic BP [phone]  Diastolic BP 60 86 70 70 80  Wt. (Lbs) 267.2 272 267 267 266.8  BMI 40.63 41.36 40.6 40.6 40.57   Foot/eye exam completion dates Latest Ref Rng & Units 11/01/2016 11/14/2015  Eye Exam No Retinopathy No Retinopathy No  Retinopathy  Foot Form Completion - - -  A1c is 6.7.  He is also lost a couple pounds. Jimmy [name]  reports that he quit smoking about 18 years ago. he has never used smokeless tobacco. He reports that he drinks about 0.6 oz of alcohol per week. He reports that he does not use drugs.     Assessment & Plan:    Type 2 diabetes mellitus with complication, without long-term current use of insulin ([place]) - Plan: HgB A1c  Morbid obesity ([place])  Coronary artery disease involving native heart without angina pectoris, unspecified vessel or lesion type  Hypertension complicating diabetes ([place])  Hyperlipidemia with target LDL less than 70  History of prostate cancer    1. Rx changes: none 2. Education: Reviewed 'ABCs' of diabetes management (respective goals in parentheses):  A1C (<7), blood pressure (<130/80), and cholesterol (LDL <100). 3. Compliance at present is estimated to be good. Efforts to improve compliance (if necessary) will be directed at increased exercise. 4. Follow up: 4 months I again encouraged him to get involved in a regular exercise program and making it daily.

## 2017-12-02 NOTE — Telephone Encounter (Signed)
Spoke with pt- he already has meter and all supplies- no further refills needed per pt.

## 2017-12-02 NOTE — Telephone Encounter (Signed)
Does he need refill?  This was sent on 11/06/2017. Jimmy [name]

## 2017-12-05 ENCOUNTER — Ambulatory Visit: Payer: Medicare Other | Admitting: Podiatry

## 2017-12-05 ENCOUNTER — Encounter: Payer: Self-pay | Admitting: Podiatry

## 2017-12-05 DIAGNOSIS — L600 Ingrowing nail: Secondary | ICD-10-CM

## 2017-12-05 NOTE — Patient Instructions (Signed)
Finish course of antibiotics   Continue soaking in epsom salts twice a day followed by antibiotic ointment and a band-aid. Can leave uncovered at night. Continue this until completely healed.  If the area has not healed in 2 weeks, call the office for follow-up appointment, or sooner if any problems arise.  Monitor for any signs/symptoms of infection. Call the office immediately if any occur or go directly to the emergency room. Call with any questions/concerns.  Have a great rest of your week. If you have any questions or concerns please give [place] a call.

## 2017-12-09 NOTE — Progress Notes (Signed)
Subjective: Jimmy [name] is a 71 y.o.  Male returns to office today for follow up evaluation after having left Hallux partial nail avulsion performed. Patient has been soaking using epsom salts and applying topical antibiotic covered with bandaid daily. Patient denies fevers, chills, nausea, vomiting. Denies any calf pain, chest pain, SOB.   Objective:  Vitals: Reviewed  General: Well developed, nourished, in no acute distress, alert and oriented x3   Dermatology: Skin is warm, dry and supple bilateral. Left hallux nail border appears to be clean, dry, with mild granular tissue and surrounding scab.  There is mild surrounding erythema but there is no increase in warmth or swelling and this is likely more from inflammation as opposed to infection.  There is no ascending cellulitis.  There is no fluctuation or crepitation.  There is no malodor.  The remaining nails appear unremarkable at this time. There are no other lesions or other signs of infection present.  Neurovascular status: Intact. No lower extremity swelling; No pain with calf compression bilateral.  Musculoskeletal: No tenderness to palpation of the left lateral hallux nail fold. Muscular strength within normal limits bilateral.   Assesement and Plan: S/p partial nail avulsion, doing well.   -Continue soaking in epsom salts twice a day followed by antibiotic ointment and a band-aid. Can leave uncovered at night. Continue this until completely healed.  -Finished course of antibiotics -Monitor for any signs/symptoms of infection. Call the office immediately if any occur or go directly to the emergency room. Call with any questions/concerns. -Follow-up 2 weeks or sooner if any issues are to arise.  Celesta Gentile, DPM

## 2017-12-12 ENCOUNTER — Encounter: Payer: Self-pay | Admitting: Internal Medicine

## 2017-12-12 ENCOUNTER — Ambulatory Visit: Payer: Medicare Other | Admitting: Internal Medicine

## 2017-12-12 VITALS — BP 156/80 | HR 100 | Ht 67.0 in | Wt 262.0 lb

## 2017-12-12 DIAGNOSIS — E782 Mixed hyperlipidemia: Secondary | ICD-10-CM

## 2017-12-12 DIAGNOSIS — R0609 Other forms of dyspnea: Secondary | ICD-10-CM

## 2017-12-12 DIAGNOSIS — R0600 Dyspnea, unspecified: Secondary | ICD-10-CM

## 2017-12-12 DIAGNOSIS — R011 Cardiac murmur, unspecified: Secondary | ICD-10-CM

## 2017-12-12 DIAGNOSIS — I10 Essential (primary) hypertension: Secondary | ICD-10-CM | POA: Diagnosis not present

## 2017-12-12 NOTE — Progress Notes (Signed)
Follow-up Outpatient Visit Date: 12/12/2017  Primary Care Provider: [name], [address] [place] [address]  Chief Complaint: Dyspnea on exertion  HPI:  Jimmy [name] is a 71 y.o. year-old male with history of non-obstructive coronary artery disease and possible RCA dissection ([facility] 2000), hypertension hyperlipidemia, diabetes mellitus, obesity, and OSA who presents for follow-up of coronary artery disease. He was previously followed in our office by Dr. Aundra Dubin, having last been seen in 10/2016. Over the last 6 months, Jimmy [name] has noticed that he gets out of breath more easily. This occurs when he is deer hunting or playing with his grandchildren. He denies chest pain, palpitations, lightheadedness, and edema. He does not exercise regularly. There were no medication changes or other precipitants for his increased DOE.[name]  --------------------------------------------------------------------------------------------------  Past medical/surgical history 1. Obesity 2. OSA: Uses CPAP 3. Hyperlipidemia: myalgias with Crestor, fenofibrate, and Lipitor.Tolerates Livalo. 4. Hives with Plavix 5. CAD: Cath in 2000 with nonobstructive disease and concern for possible RCA dissection.  Normal myovew in 3/ll.   6. Prostate cancer s/p seed implantation and radiation.  7. Elevated LFTs: ?NASH.  8. Diabetes mellitus 9. Hypertension  Current Meds  Medication Sig  . ASPIRIN PO Take 81 mg by mouth daily.   . B-D UF III MINI PEN NEEDLES 31G X 5 MM MISC USE AS DIRECTED EVERY DAY  . blood glucose meter kit and supplies Dispense One touch ultra meter. E11.9  . calcium citrate-vitamin D (CITRACAL+D) 315-200 MG-UNIT per tablet Take 4 tablets by mouth daily.    . cholecalciferol (VITAMIN D) 1000 UNITS tablet Take 2,000 Units by mouth daily.   . Coenzyme Q10 (CO Q 10) 100 MG CAPS Take 1 tablet by mouth daily.  . fenofibrate 160 MG tablet Take 1 tablet (160 mg total) by mouth daily.  Please keep upcoming appt  In December for future refills. Thank you  . LIVALO 4 MG TABS TAKE 1 TABLET BY MOUTH EVERY DAY  . Magnesium 250 MG TABS Take 200 mg by mouth.   . metFORMIN (GLUCOPHAGE) 1000 MG tablet TAKE 1 TABLET BY MOUTH TWICE A DAY WITH A MEAL  . Multiple Vitamins-Minerals (MULTIVITAMIN WITH MINERALS) tablet Take 1 tablet by mouth daily.    [name] omega-3 acid ethyl esters (LOVAZA) 1 g capsule TAKE 4 CAPSULES BY MOUTH EVERY DAY  . ONE TOUCH ULTRA TEST test strip USE TO TEST BLOOD SUGAR DAILY AS DIRECTED BY DOCTOR  . ONETOUCH DELICA LANCETS 41U MISC TEST ONCE DAILY AS DIRECTED  . ONETOUCH DELICA LANCETS 38G MISC TEST ONCE DAILY AS DIRECTED  . ramipril (ALTACE) 5 MG capsule Take 1 capsule (5 mg total) daily by mouth. Please keep upcoming appt for future refills. Thank you  . VICTOZA 18 MG/3ML SOPN INJECT 1.2 MG INTO THE SKIN DAILY.    Allergies: Crestor [rosuvastatin calcium]; Lipitor [atorvastatin calcium]; and Plavix [clopidogrel bisulfate]  Social History   Socioeconomic History  . Marital status: Married    Spouse name: Not on file  . Number of children: Not on file  . Years of education: Not on file  . Highest education level: Not on file  Social Needs  . Financial resource strain: Not on file  . Food insecurity - worry: Not on file  . Food insecurity - inability: Not on file  . Transportation needs - medical: Not on file  . Transportation needs - non-medical: Not on file  Occupational History  . Not on file  Tobacco Use  .  Smoking status: Former Smoker    Last attempt to quit: 08/20/1999    Years since quitting: 18.3  . Smokeless tobacco: Never Used  Substance and Sexual Activity  . Alcohol use: Yes    Alcohol/week: 0.6 oz    Types: 1 Shots of liquor per week  . Drug use: No  . Sexual activity: Not Currently  Other Topics Concern  . Not on file  Social History Narrative  . Not on file    Family History  Problem Relation Age of Onset  . COPD Father      Review of Systems: A 12-system review of systems was performed and was negative except as noted in the HPI.  --------------------------------------------------------------------------------------------------  Physical Exam: BP (!) 156/80   Pulse 100   Ht '5\' 7"'  (1.702 m)   Wt 262 lb (118.8 kg)   SpO2 97%   BMI 41.04 kg/m  Repeat BP 128/60 Repeat HR 96 bpm  General:  Morbidly obese man, seated comfortably in the exam room. HEENT: No conjunctival pallor or scleral icterus. Moist mucous membranes.  OP clear. Neck: Supple without lymphadenopathy, thyromegaly, JVD, or HJR. No carotid bruit. Lungs: Normal work of breathing. Clear to auscultation bilaterally without wheezes or crackles. Heart: Distant heart sounds. Regular rate and rhythm with 1/6 high-pitched systolic murmur loudest at the RUSB. No rubs or gallops. Non-displaced PMI. Abd: Bowel sounds present. Soft, NT/ND without hepatosplenomegaly Ext: Trace pretibial edema bilaterally. Radial, PT, and DP pulses are 2+ bilaterally. Skin: Warm and dry without rash.  EKG:  NSR (HR 95 bpm) without significant abnormalities.  Lab Results  Component Value Date   WBC 5.9 03/17/2015   HGB 14.5 03/17/2015   HCT 43.8 03/17/2015   MCV 92.2 03/17/2015   PLT 148 (L) 03/17/2015    Lab Results  Component Value Date   NA 139 11/19/2016   K 4.3 11/19/2016   CL 107 11/19/2016   CO2 26 11/19/2016   BUN 20 11/19/2016   CREATININE 0.89 11/19/2016   GLUCOSE 112 (H) 11/19/2016   ALT 45 (H) 03/20/2017    Lab Results  Component Value Date   CHOL 149 03/20/2017   HDL 47 03/20/2017   LDLCALC 52 03/20/2017   LDLDIRECT 73.0 08/22/2015   TRIG 250 (H) 03/20/2017   CHOLHDL 3.2 03/20/2017    --------------------------------------------------------------------------------------------------  ASSESSMENT AND PLAN: Dyspnea on exertion This has gradually progressed over the last 6 months. Other than trace pretibial edema, Jimmy [name] appears  grossly euvolemic (though body habitus limits evaluation). His weight is down 9 pounds since his visit with [place] last year. Given new symptoms and systolic murmur appreciated on exam today, we have agreed to perform an echocardiogram. If this does not reveal a cause for his dyspnea, we will proceed with exercise myocardial perfusion stress test for further assessment. I will check a CBC and BMP today to ensure that anemia and renal insufficiency are not contributing to his symptoms. These labs were last checked >1 year ago.  Hypertension Initial BP reading elevated, though recheck value was normal. We will continue ramipril 5 mg daily.  Hyperlipidemia Continue Livalo, which Mr. Dacanay is tolerating well. LDL in 02/2017 was excellent (52).  Follow-up: Return to clinic in 6 months.  Nelva Bush, MD 12/12/2017 2:49 PM

## 2017-12-12 NOTE — Patient Instructions (Signed)
Medication Instructions:  Your physician recommends that you continue on your current medications as directed. Please refer to the Current Medication list given to you today.   Labwork: BMET/CBCd today  Testing/Procedures: Your physician has requested that you have an echocardiogram. Echocardiography is a painless test that uses sound waves to create images of your heart. It provides your doctor with information about the size and shape of your heart and how well your heart's chambers and valves are working. This procedure takes approximately one hour. There are no restrictions for this procedure.    Follow-Up: Your physician wants you to follow-up in: 6 months with Dr End. (June 2019). You will receive a reminder letter in the mail two months in advance. If you don't receive a letter, please call our office to schedule the follow-up appointment.       If you need a refill on your cardiac medications before your next appointment, please call your pharmacy.

## 2017-12-13 ENCOUNTER — Telehealth: Payer: Self-pay | Admitting: Internal Medicine

## 2017-12-13 ENCOUNTER — Telehealth: Payer: Self-pay | Admitting: Family Medicine

## 2017-12-13 ENCOUNTER — Encounter: Payer: Self-pay | Admitting: Internal Medicine

## 2017-12-13 DIAGNOSIS — R0602 Shortness of breath: Secondary | ICD-10-CM | POA: Insufficient documentation

## 2017-12-13 LAB — BASIC METABOLIC PANEL
BUN/Creatinine Ratio: 21 (ref 10–24)
BUN: 22 mg/dL (ref 8–27)
CO2: 23 mmol/L (ref 20–29)
Calcium: 10.4 mg/dL — ABNORMAL HIGH (ref 8.6–10.2)
Chloride: 103 mmol/L (ref 96–106)
Creatinine, Ser: 1.03 mg/dL (ref 0.76–1.27)
GFR calc Af Amer: 84 mL/min/{1.73_m2} (ref >59)
GFR calc non Af Amer: 73 mL/min/{1.73_m2} (ref >59)
Glucose: 225 mg/dL — ABNORMAL HIGH (ref 65–99)
Potassium: 4.8 mmol/L (ref 3.5–5.2)
Sodium: 142 mmol/L (ref 134–144)

## 2017-12-13 LAB — CBC WITH DIFFERENTIAL/PLATELET
Basophils Absolute: 0 10*3/uL (ref 0.0–0.2)
Basos: 0 %
EOS (ABSOLUTE): 0.1 10*3/uL (ref 0.0–0.4)
Eos: 1 %
Hematocrit: 43 % (ref 37.5–51.0)
Hemoglobin: 13.9 g/dL (ref 13.0–17.7)
Immature Grans (Abs): 0 10*3/uL (ref 0.0–0.1)
Immature Granulocytes: 0 %
Lymphocytes Absolute: 1.3 10*3/uL (ref 0.7–3.1)
Lymphs: 18 %
MCH: 30.5 pg (ref 26.6–33.0)
MCHC: 32.3 g/dL (ref 31.5–35.7)
MCV: 95 fL (ref 79–97)
Monocytes Absolute: 0.5 10*3/uL (ref 0.1–0.9)
Monocytes: 6 %
Neutrophils Absolute: 5.3 10*3/uL (ref 1.4–7.0)
Neutrophils: 75 %
Platelets: 170 10*3/uL (ref 150–379)
RBC: 4.55 x10E6/uL (ref 4.14–5.80)
RDW: 13.7 % (ref 12.3–15.4)
WBC: 7.2 10*3/uL (ref 3.4–10.8)

## 2017-12-13 NOTE — Telephone Encounter (Signed)
Pt wife called and states that pt had blood work done at his  cardiologist dr and they where going to forward it all over to you it states that the calcium was border line of being high, she is wanting to know if he needs to come in to be seen of this has anything to do with the calcium pills that has been taking making it go up pt can  Be reached at [phone] informed pt that you was out of the office today and would be back Monday

## 2017-12-13 NOTE — Telephone Encounter (Signed)
Pt aware of lab results and recommendations per Dr End.  Pt verbalized understanding and agrees with this plan.  Will forward a copy of pts lab results to his PCP via Epic.

## 2017-12-13 NOTE — Telephone Encounter (Signed)
Basic metabolic panel (Order [id number])  Result Notes for Basic metabolic panel   Notes recorded by Katrine Coho, RN on 12/13/2017 at 1:16 PM EST LMTCB ------  Notes recorded by End, Harrell Gave, MD on 12/13/2017 at 1:15 PM EST Please let Mr. Rivadeneira know that his kidney function and blood counts are normal. His calcium is borderline elevated, which he should discuss with his PCP to determine if further work-up is needed. He should continue his current medications, as discussed at yesterday's visit.

## 2017-12-13 NOTE — Telephone Encounter (Signed)
It could easily be the calcium pills.  Have him hold off on that and scheduled for repeat blood work in 1 month

## 2017-12-13 NOTE — Telephone Encounter (Signed)
New message ° °Pt verbalized that he is returning call for the rn  °

## 2017-12-16 ENCOUNTER — Ambulatory Visit: Payer: Medicare Other | Admitting: Podiatry

## 2017-12-16 NOTE — Telephone Encounter (Signed)
Wife aware- BMP scheduled for 01/23/2018. Pt aware to stop calcium pills until recheck. Victorino December

## 2017-12-19 ENCOUNTER — Other Ambulatory Visit: Payer: Self-pay

## 2017-12-19 ENCOUNTER — Ambulatory Visit (HOSPITAL_COMMUNITY): Payer: Medicare Other | Attending: Cardiovascular Disease

## 2017-12-19 DIAGNOSIS — R0609 Other forms of dyspnea: Secondary | ICD-10-CM | POA: Diagnosis not present

## 2017-12-19 DIAGNOSIS — G4733 Obstructive sleep apnea (adult) (pediatric): Secondary | ICD-10-CM | POA: Diagnosis not present

## 2017-12-19 DIAGNOSIS — E119 Type 2 diabetes mellitus without complications: Secondary | ICD-10-CM | POA: Insufficient documentation

## 2017-12-19 DIAGNOSIS — I2510 Atherosclerotic heart disease of native coronary artery without angina pectoris: Secondary | ICD-10-CM | POA: Insufficient documentation

## 2017-12-19 DIAGNOSIS — R011 Cardiac murmur, unspecified: Secondary | ICD-10-CM | POA: Insufficient documentation

## 2017-12-19 DIAGNOSIS — E785 Hyperlipidemia, unspecified: Secondary | ICD-10-CM | POA: Insufficient documentation

## 2017-12-19 DIAGNOSIS — R0600 Dyspnea, unspecified: Secondary | ICD-10-CM

## 2017-12-23 ENCOUNTER — Telehealth: Payer: Self-pay | Admitting: Internal Medicine

## 2017-12-23 ENCOUNTER — Other Ambulatory Visit: Payer: Self-pay | Admitting: *Deleted

## 2017-12-23 DIAGNOSIS — R0600 Dyspnea, unspecified: Secondary | ICD-10-CM

## 2017-12-23 DIAGNOSIS — R0609 Other forms of dyspnea: Principal | ICD-10-CM

## 2017-12-23 NOTE — Telephone Encounter (Signed)
Patient returning call for results 

## 2017-12-23 NOTE — Telephone Encounter (Signed)
Notes recorded by Nelva Bush, MD on 12/22/2017 at 10:22 AM EST Please let [name] know that his echo shows that his heart is contracting well. There is no obvious explanation for his shortness of breath with activity over the last several months. I recommend that we proceed with exercise myocardial perfusion stress test, as we discussed at his recent office visit.  Luisa Dago discussed with patient, he verbalized understanding, agreed with plan, [facility] to contact pt to schedule myoview.

## 2017-12-23 NOTE — Telephone Encounter (Signed)
F/u call: Patient wife calling, states that she is returning call.

## 2018-01-02 NOTE — Telephone Encounter (Signed)
Myoview is scheduled for 01/08/18-I have called pt and reviewed myoview instructions over the telephone with patient, he verbalized understanding.   Your physician has requested that you have a stress myoview. For further information please visit [email or web address].   How to Prepare for Your Myoview Test:        A. Nothing to eat or drink 2 hours prior to arrival time, except you may drink water       B. No Caffeine/Decaffeinated products or chocolate 12 hours prior to arrival.       C. No Cologne or Lotion       D. Wear comfortable walking shoes       E. Total time is 3 to 4 hours; you may want to bring reading material for the waiting               time. If someone comes with you, they will need to remain in the lobby due to                  limited space in the testing area.        F. Please report to [address] for your test.        G. Medication Instructions:   After You Arrive:  Once you arrive in the Nuclear Cardiology lab, an IV will be started, then the Technologist will inject a small amount of radioactive tracer. There will be a 1 hour waiting period after this injection. A series of pictures will be taken of your heart following this waiting period. You will be prepped for the stress portion of the test. During the stress portion of your test a small amount of radioactive tracer will be injected in your IV. After the stress portion, there is a short rest period during which time your heart and blood pressure will be monitored. After the short test period the Technologist will begin your second set of pictures.

## 2018-01-03 ENCOUNTER — Emergency Department (HOSPITAL_COMMUNITY): Payer: Medicare Other

## 2018-01-03 ENCOUNTER — Other Ambulatory Visit: Payer: Self-pay

## 2018-01-03 ENCOUNTER — Encounter (HOSPITAL_COMMUNITY): Payer: Self-pay | Admitting: Emergency Medicine

## 2018-01-03 ENCOUNTER — Observation Stay (HOSPITAL_COMMUNITY)
Admission: EM | Admit: 2018-01-03 | Discharge: 2018-01-04 | Disposition: A | Discharge status: Home / Self Care | Payer: Medicare Other | Attending: Family Medicine | Admitting: Family Medicine

## 2018-01-03 DIAGNOSIS — J9601 Acute respiratory failure with hypoxia: Principal | ICD-10-CM | POA: Insufficient documentation

## 2018-01-03 DIAGNOSIS — Z6839 Body mass index (BMI) 39.0-39.9, adult: Secondary | ICD-10-CM | POA: Insufficient documentation

## 2018-01-03 DIAGNOSIS — Z9119 Patient's noncompliance with other medical treatment and regimen: Secondary | ICD-10-CM | POA: Diagnosis not present

## 2018-01-03 DIAGNOSIS — Z794 Long term (current) use of insulin: Secondary | ICD-10-CM | POA: Diagnosis not present

## 2018-01-03 DIAGNOSIS — K760 Fatty (change of) liver, not elsewhere classified: Secondary | ICD-10-CM | POA: Diagnosis not present

## 2018-01-03 DIAGNOSIS — Z91199 Patient's noncompliance with other medical treatment and regimen due to unspecified reason: Secondary | ICD-10-CM

## 2018-01-03 DIAGNOSIS — I10 Essential (primary) hypertension: Secondary | ICD-10-CM | POA: Diagnosis not present

## 2018-01-03 DIAGNOSIS — Z79899 Other long term (current) drug therapy: Secondary | ICD-10-CM | POA: Insufficient documentation

## 2018-01-03 DIAGNOSIS — R935 Abnormal findings on diagnostic imaging of other abdominal regions, including retroperitoneum: Secondary | ICD-10-CM

## 2018-01-03 DIAGNOSIS — E559 Vitamin D deficiency, unspecified: Secondary | ICD-10-CM | POA: Diagnosis not present

## 2018-01-03 DIAGNOSIS — E782 Mixed hyperlipidemia: Secondary | ICD-10-CM | POA: Insufficient documentation

## 2018-01-03 DIAGNOSIS — Z87891 Personal history of nicotine dependence: Secondary | ICD-10-CM | POA: Insufficient documentation

## 2018-01-03 DIAGNOSIS — Y92238 Other place in hospital as the place of occurrence of the external cause: Secondary | ICD-10-CM | POA: Insufficient documentation

## 2018-01-03 DIAGNOSIS — Z888 Allergy status to other drugs, medicaments and biological substances status: Secondary | ICD-10-CM | POA: Insufficient documentation

## 2018-01-03 DIAGNOSIS — T402X5A Adverse effect of other opioids, initial encounter: Secondary | ICD-10-CM | POA: Insufficient documentation

## 2018-01-03 DIAGNOSIS — R0902 Hypoxemia: Secondary | ICD-10-CM

## 2018-01-03 DIAGNOSIS — N200 Calculus of kidney: Secondary | ICD-10-CM | POA: Diagnosis not present

## 2018-01-03 DIAGNOSIS — J9600 Acute respiratory failure, unspecified whether with hypoxia or hypercapnia: Secondary | ICD-10-CM

## 2018-01-03 DIAGNOSIS — E1159 Type 2 diabetes mellitus with other circulatory complications: Secondary | ICD-10-CM | POA: Diagnosis present

## 2018-01-03 DIAGNOSIS — E1151 Type 2 diabetes mellitus with diabetic peripheral angiopathy without gangrene: Secondary | ICD-10-CM | POA: Diagnosis not present

## 2018-01-03 DIAGNOSIS — R0609 Other forms of dyspnea: Secondary | ICD-10-CM | POA: Diagnosis not present

## 2018-01-03 DIAGNOSIS — G4730 Sleep apnea, unspecified: Secondary | ICD-10-CM | POA: Diagnosis present

## 2018-01-03 DIAGNOSIS — M5136 Other intervertebral disc degeneration, lumbar region: Secondary | ICD-10-CM | POA: Diagnosis not present

## 2018-01-03 DIAGNOSIS — Z9181 History of falling: Secondary | ICD-10-CM | POA: Insufficient documentation

## 2018-01-03 DIAGNOSIS — N210 Calculus in bladder: Secondary | ICD-10-CM

## 2018-01-03 DIAGNOSIS — E118 Type 2 diabetes mellitus with unspecified complications: Secondary | ICD-10-CM | POA: Diagnosis present

## 2018-01-03 DIAGNOSIS — I152 Hypertension secondary to endocrine disorders: Secondary | ICD-10-CM | POA: Diagnosis present

## 2018-01-03 DIAGNOSIS — J439 Emphysema, unspecified: Secondary | ICD-10-CM | POA: Diagnosis not present

## 2018-01-03 DIAGNOSIS — Z8546 Personal history of malignant neoplasm of prostate: Secondary | ICD-10-CM | POA: Diagnosis not present

## 2018-01-03 DIAGNOSIS — Z7982 Long term (current) use of aspirin: Secondary | ICD-10-CM | POA: Diagnosis not present

## 2018-01-03 DIAGNOSIS — I700 Atherosclerosis of aorta: Secondary | ICD-10-CM | POA: Diagnosis not present

## 2018-01-03 DIAGNOSIS — G4733 Obstructive sleep apnea (adult) (pediatric): Secondary | ICD-10-CM | POA: Diagnosis not present

## 2018-01-03 DIAGNOSIS — R52 Pain, unspecified: Secondary | ICD-10-CM | POA: Diagnosis present

## 2018-01-03 DIAGNOSIS — I2510 Atherosclerotic heart disease of native coronary artery without angina pectoris: Secondary | ICD-10-CM | POA: Insufficient documentation

## 2018-01-03 DIAGNOSIS — M810 Age-related osteoporosis without current pathological fracture: Secondary | ICD-10-CM | POA: Insufficient documentation

## 2018-01-03 DIAGNOSIS — R0602 Shortness of breath: Secondary | ICD-10-CM | POA: Insufficient documentation

## 2018-01-03 DIAGNOSIS — Z825 Family history of asthma and other chronic lower respiratory diseases: Secondary | ICD-10-CM | POA: Insufficient documentation

## 2018-01-03 LAB — URINALYSIS, ROUTINE W REFLEX MICROSCOPIC
Bilirubin Urine: NEGATIVE
Glucose, UA: NEGATIVE mg/dL
Ketones, ur: NEGATIVE mg/dL
Nitrite: NEGATIVE
Protein, ur: NEGATIVE mg/dL
Specific Gravity, Urine: 1.02 (ref 1.005–1.030)
Squamous Epithelial / LPF: NONE SEEN
pH: 5 (ref 5.0–8.0)

## 2018-01-03 LAB — BRAIN NATRIURETIC PEPTIDE: B Natriuretic Peptide: 33.2 pg/mL (ref 0.0–100.0)

## 2018-01-03 LAB — I-STAT TROPONIN, ED
Troponin i, poc: 0 ng/mL (ref 0.00–0.08)
Troponin i, poc: 0 ng/mL (ref 0.00–0.08)

## 2018-01-03 LAB — HEPATIC FUNCTION PANEL
ALT: 37 U/L (ref 17–63)
AST: 39 U/L (ref 15–41)
Albumin: 4.1 g/dL (ref 3.5–5.0)
Alkaline Phosphatase: 37 U/L — ABNORMAL LOW (ref 38–126)
Bilirubin, Direct: 0.1 mg/dL (ref 0.1–0.5)
Indirect Bilirubin: 0.7 mg/dL (ref 0.3–0.9)
Total Bilirubin: 0.8 mg/dL (ref 0.3–1.2)
Total Protein: 7.8 g/dL (ref 6.5–8.1)

## 2018-01-03 LAB — BASIC METABOLIC PANEL
Anion gap: 11 (ref 5–15)
BUN: 23 mg/dL — ABNORMAL HIGH (ref 6–20)
CO2: 19 mmol/L — ABNORMAL LOW (ref 22–32)
Calcium: 10.2 mg/dL (ref 8.9–10.3)
Chloride: 106 mmol/L (ref 101–111)
Creatinine, Ser: 0.98 mg/dL (ref 0.61–1.24)
GFR calc Af Amer: >60 mL/min (ref >60)
GFR calc non Af Amer: >60 mL/min (ref >60)
Glucose, Bld: 180 mg/dL — ABNORMAL HIGH (ref 65–99)
Potassium: 4.4 mmol/L (ref 3.5–5.1)
Sodium: 136 mmol/L (ref 135–145)

## 2018-01-03 LAB — CBC
HCT: 43.3 % (ref 39.0–52.0)
Hemoglobin: 14.1 g/dL (ref 13.0–17.0)
MCH: 30.7 pg (ref 26.0–34.0)
MCHC: 32.6 g/dL (ref 30.0–36.0)
MCV: 94.3 fL (ref 78.0–100.0)
Platelets: 148 10*3/uL — ABNORMAL LOW (ref 150–400)
RBC: 4.59 MIL/uL (ref 4.22–5.81)
RDW: 14.6 % (ref 11.5–15.5)
WBC: 9.6 10*3/uL (ref 4.0–10.5)

## 2018-01-03 LAB — GLUCOSE, CAPILLARY: Glucose-Capillary: 282 mg/dL — ABNORMAL HIGH (ref 65–99)

## 2018-01-03 LAB — CBG MONITORING, ED: Glucose-Capillary: 187 mg/dL — ABNORMAL HIGH (ref 65–99)

## 2018-01-03 LAB — HEMOGLOBIN A1C
Hgb A1c MFr Bld: 6.9 % — ABNORMAL HIGH (ref 4.8–5.6)
Mean Plasma Glucose: 151.33 mg/dL

## 2018-01-03 MED ORDER — KETOROLAC TROMETHAMINE 30 MG/ML IJ SOLN: 15.0000 mg | Freq: Once | INTRAMUSCULAR | Status: DC

## 2018-01-03 MED ORDER — ACETAMINOPHEN 650 MG RE SUPP: 650.0000 mg | Freq: Four times a day (QID) | RECTAL | Status: DC | PRN

## 2018-01-03 MED ORDER — METHYLPREDNISOLONE SODIUM SUCC 40 MG IJ SOLR
40.0000 mg | Freq: Two times a day (BID) | INTRAMUSCULAR | Status: AC
  Administered 2018-01-03 – 2018-01-04 (×2): 40 mg via INTRAVENOUS
  Filled 2018-01-03 (×2): qty 1

## 2018-01-03 MED ORDER — MORPHINE SULFATE (PF) 4 MG/ML IV SOLN
4.0000 mg | Freq: Once | INTRAVENOUS | Status: AC
  Administered 2018-01-03: 4 mg via INTRAVENOUS
  Filled 2018-01-03: qty 1

## 2018-01-03 MED ORDER — TAMSULOSIN HCL 0.4 MG PO CAPS
0.4000 mg | ORAL_CAPSULE | Freq: Every day | ORAL | Status: DC
  Administered 2018-01-04: 0.4 mg via ORAL
  Filled 2018-01-03: qty 1

## 2018-01-03 MED ORDER — HYDROMORPHONE HCL 1 MG/ML IJ SOLN: 0.5000 mg | INTRAMUSCULAR | Status: DC | PRN

## 2018-01-03 MED ORDER — SODIUM CHLORIDE 0.9 % IV SOLN
INTRAVENOUS | Status: DC
  Administered 2018-01-03 – 2018-01-04 (×2): via INTRAVENOUS

## 2018-01-03 MED ORDER — FENOFIBRATE 160 MG PO TABS
160.0000 mg | ORAL_TABLET | Freq: Every day | ORAL | Status: DC
  Administered 2018-01-04: 160 mg via ORAL
  Filled 2018-01-03: qty 1

## 2018-01-03 MED ORDER — MORPHINE SULFATE (PF) 4 MG/ML IV SOLN
4.0000 mg | Freq: Once | INTRAVENOUS | Status: AC
Start: 2018-01-03 — End: 2018-01-03
  Administered 2018-01-03: 4 mg via INTRAVENOUS
  Filled 2018-01-03: qty 1

## 2018-01-03 MED ORDER — HYDROCODONE-ACETAMINOPHEN 5-325 MG PO TABS
1.0000 | ORAL_TABLET | ORAL | Status: DC | PRN
  Administered 2018-01-03: 2 via ORAL
  Filled 2018-01-03: qty 2

## 2018-01-03 MED ORDER — RAMIPRIL 5 MG PO CAPS
5.0000 mg | ORAL_CAPSULE | Freq: Every day | ORAL | Status: DC
  Administered 2018-01-04: 5 mg via ORAL
  Filled 2018-01-03: qty 1

## 2018-01-03 MED ORDER — ASPIRIN 81 MG PO CHEW
81.0000 mg | CHEWABLE_TABLET | Freq: Every day | ORAL | Status: DC
  Administered 2018-01-03 – 2018-01-04 (×2): 81 mg via ORAL
  Filled 2018-01-03 (×2): qty 1

## 2018-01-03 MED ORDER — INSULIN ASPART 100 UNIT/ML ~~LOC~~ SOLN
0.0000 [IU] | Freq: Every day | SUBCUTANEOUS | Status: DC
  Administered 2018-01-03: 3 [IU] via SUBCUTANEOUS

## 2018-01-03 MED ORDER — FUROSEMIDE 10 MG/ML IJ SOLN
40.0000 mg | Freq: Once | INTRAMUSCULAR | Status: AC
  Administered 2018-01-03: 40 mg via INTRAVENOUS
  Filled 2018-01-03: qty 4

## 2018-01-03 MED ORDER — ENOXAPARIN SODIUM 40 MG/0.4ML ~~LOC~~ SOLN
40.0000 mg | SUBCUTANEOUS | Status: DC
  Administered 2018-01-04: 40 mg via SUBCUTANEOUS
  Filled 2018-01-03: qty 0.4

## 2018-01-03 MED ORDER — ONDANSETRON HCL 4 MG/2ML IJ SOLN: 4.0000 mg | Freq: Four times a day (QID) | INTRAMUSCULAR | Status: DC | PRN

## 2018-01-03 MED ORDER — IOPAMIDOL (ISOVUE-370) INJECTION 76%
INTRAVENOUS | Status: AC
  Administered 2018-01-03: 100 mL
  Filled 2018-01-03: qty 100

## 2018-01-03 MED ORDER — ACETAMINOPHEN 325 MG PO TABS: 650.0000 mg | ORAL_TABLET | Freq: Four times a day (QID) | ORAL | Status: DC | PRN

## 2018-01-03 MED ORDER — ONDANSETRON HCL 4 MG PO TABS: 4.0000 mg | ORAL_TABLET | Freq: Four times a day (QID) | ORAL | Status: DC | PRN

## 2018-01-03 MED ORDER — ALBUTEROL SULFATE (2.5 MG/3ML) 0.083% IN NEBU
2.5000 mg | INHALATION_SOLUTION | Freq: Four times a day (QID) | RESPIRATORY_TRACT | Status: AC
  Administered 2018-01-03 (×2): 2.5 mg via RESPIRATORY_TRACT
  Filled 2018-01-03 (×2): qty 3

## 2018-01-03 MED ORDER — EZETIMIBE 10 MG PO TABS
10.0000 mg | ORAL_TABLET | Freq: Every day | ORAL | Status: DC
  Administered 2018-01-04: 10 mg via ORAL
  Filled 2018-01-03: qty 1

## 2018-01-03 MED ORDER — ALBUTEROL SULFATE (2.5 MG/3ML) 0.083% IN NEBU: 2.5000 mg | INHALATION_SOLUTION | RESPIRATORY_TRACT | Status: DC | PRN

## 2018-01-03 MED ORDER — INSULIN ASPART 100 UNIT/ML ~~LOC~~ SOLN
0.0000 [IU] | Freq: Three times a day (TID) | SUBCUTANEOUS | Status: DC
  Administered 2018-01-03: 3 [IU] via SUBCUTANEOUS
  Administered 2018-01-04: 5 [IU] via SUBCUTANEOUS
  Filled 2018-01-03: qty 1

## 2018-01-03 MED ORDER — BISACODYL 5 MG PO TBEC: 5.0000 mg | DELAYED_RELEASE_TABLET | Freq: Every day | ORAL | Status: DC | PRN

## 2018-01-03 NOTE — ED Triage Notes (Signed)
Right side 10/10 flank pain that started today at 2 am with no relief, pt denies any urinary symptoms, no fever or chills.

## 2018-01-03 NOTE — ED Notes (Signed)
Patient returned from CT

## 2018-01-03 NOTE — ED Notes (Signed)
Pt reports having pain which is causing sob on re-eval. Will do ekg and order chest xray, VSS, no acute resp distress is noted at this time.

## 2018-01-03 NOTE — ED Notes (Signed)
Per Verline Lema, EMT pt ambulatory with steady gait with O2 sat decreasing to 86-87% on RA.

## 2018-01-03 NOTE — H&P (Signed)
History and Physical    Jimmy [name] ZOX:[id number] DOB: 04/23/1946 DOA: 01/03/2018  PCP: Jimmy Lung, MD Patient coming from: home  Chief Complaint: right flank pain/dyspnea  HPI: Jimmy [name] is a 72 y.o. male with medical history significant for diabetes, CAD, prostate cancer, hypertension, obesity, obstructive sleep apnea, peripheral artery disease presents to the emergency Department chief complaint persistent right flank pain and abdominal pain. Initial evaluation reveals bladder stone and acute respiratory failure with hypoxia on room air during ambulation. [facility] asked to admit for pain management and monitoring of hypoxemia  Information is obtained from the patient. He states that over the last 6 months he has experienced dyspnea with exertion. He has seen his cardiologist for workup. He's had an echocardiogram EKG unremarkable. He's scheduled for stress test in 5 days. He states yesterday afternoon he developed right flank and abdominal pain. He describes the pain as a dull ache that is constant and nonradiating. He states when he would put pressure on that flank area the pain would improve briefly. He reports the pain kept him awake last night. Associated symptoms include worsening shortness of breath and pleuritic pain. He denies nausea vomiting diarrhea constipation melena bright red blood per rectum. He denies dysuria hematuria frequency or urgency. He denies fever chills headache dizziness syncope or near-syncope. He denies chest pain palpitations worsening lower extremity edema orthopnea. Ports a fall yesterday with bending over. He denies hitting his head or losing consciousness. He denies any other injury. He states that a history of vertigo in the past but denies any of those symptoms today.   ED Course: In the emergency department he's afebrile hemodynamically stable with an oxygen saturation level of 88% on room air with ambulation. Prior to this he was  provided with 4 mg of morphine 2.  Review of Systems: As per HPI otherwise all other systems reviewed and are negative.   Ambulatory Status: Ambulates independent with a fairly steady gait. He states he fell yesterday when he "lost his balance" when bending over.  Past Medical History:  Diagnosis Date  . Astigmatism of both eyes 11/01/2016  . Bronchitis   . Cortical age-related cataract of both eyes 11/01/2016  . Drug-induced osteoporosis   . Dyslipidemia   . Fatigue   . Fatty liver   . Hypercholesteremia   . Hypogonadism male   . 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 Normal nuclear stress test March 2011  . Nuclear sclerotic cataract of both eyes 11/01/2016  . Obesity   . Prostate cancer [facility])    s/p seed implant and radiation  . Sleep apnea   . Vitamin D insufficiency     Past Surgical History:  Procedure Laterality Date  . CARDIAC CATHETERIZATION  April 2000  . RADIOACTIVE SEED IMPLANT  2009   Implanted radiation seeds    Social History   Socioeconomic History  . Marital status: Married    Spouse name: Not on file  . Number of children: Not on file  . Years of education: Not on file  . Highest education level: Not on file  Social Needs  . Financial resource strain: Not on file  . Food insecurity - worry: Not on file  . Food insecurity - inability: Not on file  . Transportation needs - medical: Not on file  . Transportation needs - non-medical: Not on file  Occupational  History  . Not on file  Tobacco Use  . Smoking status: Former Smoker    Last attempt to quit: 08/20/1999    Years since quitting: 18.3  . Smokeless tobacco: Never Used  Substance and Sexual Activity  . Alcohol use: Yes    Alcohol/week: 0.6 oz    Types: 1 Shots of liquor per week  . Drug use: No  . Sexual activity: Not Currently  Other Topics Concern  . Not on file  Social History Narrative  . Not  on file    Allergies  Allergen Reactions  . Crestor [Rosuvastatin Calcium]   . Lipitor [Atorvastatin Calcium] Other (See Comments)    MUSCLE PAINS   . Plavix [Clopidogrel Bisulfate] Hives    Family History  Problem Relation Age of Onset  . COPD Father     Prior to Admission medications   Medication Sig Start Date End Date Taking? Authorizing Provider  ASPIRIN PO Take 81 mg by mouth daily.    Yes [provider]  cholecalciferol (VITAMIN D) 1000 UNITS tablet Take 2,000 Units by mouth daily.    Yes [provider]  Coenzyme Q10 (CO Q 10) 100 MG CAPS Take 1 tablet by mouth daily.   Yes [provider]  ezetimibe (ZETIA) 10 MG tablet Take 10 mg by mouth daily.   Yes [provider]  fenofibrate 160 MG tablet Take 1 tablet (160 mg total) by mouth daily. Please keep upcoming appt  In December for future refills. Thank you 11/28/17  Yes Larey Dresser, MD  LIVALO 4 MG TABS TAKE 1 TABLET BY MOUTH EVERY DAY 01/11/17  Yes Jimmy Lung, MD  Magnesium 250 MG TABS Take 200 mg by mouth.    Yes [provider]  metFORMIN (GLUCOPHAGE) 1000 MG tablet TAKE 1 TABLET BY MOUTH TWICE A DAY WITH A MEAL 11/06/17  Yes Jimmy Lung, MD  Multiple Vitamins-Minerals (MULTIVITAMIN WITH MINERALS) tablet Take 1 tablet by mouth daily.     Yes [provider]  naproxen sodium (ALEVE) 220 MG tablet Take 220 mg by mouth daily as needed (pain).   Yes [provider]  omega-3 acid ethyl esters (LOVAZA) 1 g capsule TAKE 4 CAPSULES BY MOUTH EVERY DAY 11/05/17  Yes Jimmy Lung, MD  ramipril (ALTACE) 5 MG capsule Take 1 capsule (5 mg total) daily by mouth. Please keep upcoming appt for future refills. Thank you 11/07/17  Yes Larey Dresser, MD  VICTOZA 18 MG/3ML SOPN INJECT 1.2 MG INTO THE SKIN DAILY. 08/22/17  Yes Jimmy Lung, MD  B-D UF III MINI PEN NEEDLES 31G X 5 MM MISC USE AS DIRECTED EVERY DAY 08/22/17   Jimmy Lung, MD  blood glucose meter  kit and supplies Dispense One touch ultra meter. E11.9 11/06/17   Jimmy Lung, MD  ONE TOUCH ULTRA TEST test strip USE TO TEST BLOOD SUGAR DAILY AS DIRECTED BY DOCTOR 10/28/17   Jimmy Lung, MD  [facility] DELICA LANCETS 34H MISC TEST ONCE DAILY AS DIRECTED 11/20/16   Jimmy Lung, MD  [facility] DELICA LANCETS 96Q MISC TEST ONCE DAILY AS DIRECTED 07/29/17   Jimmy Lung, MD    Physical Exam: Vitals:   01/03/18 1300 01/03/18 1330 01/03/18 1400 01/03/18 1600  BP: (!) 109/58 (!) 115/58 128/66 122/67  Pulse: 89 89 91 88  Resp: 20 (!) 22 (!) 24 20  Temp:      TempSrc:      SpO2:  93% 92% 90% 94%  Weight:      Height:         General:  Appears calm and comfortable obese sitting up in bed in no acute distress Eyes:  PERRL, EOMI, normal lids, iris ENT:  grossly normal hearing, lips & tongue, mucous membranes of his mouth are pink slightly dry Neck:  no LAD, masses or thyromegaly Cardiovascular:  RRR, no m/r/g. Trace LE edema.  Respiratory:  Mild increased work of breathing with conversation. Breath sounds quite diminished. No crackles no wheezes Abdomen:  soft, ntnd, obese positive bowel sounds throughout no guarding or rebounding Skin:  no rash or induration seen on limited exam Musculoskeletal:  grossly normal tone BUE/BLE, good ROM, no bony abnormality Psychiatric:  grossly normal mood and affect, speech fluent and appropriate, AOx3 Neurologic:  CN 2-12 grossly intact, moves all extremities in coordinated fashion, sensation intact speech clear facial symmetry  Labs on Admission: I have personally reviewed following labs and imaging studies  CBC: Recent Labs  Lab 01/03/18 0546  WBC 9.6  HGB 14.1  HCT 43.3  MCV 94.3  PLT 175*   Basic Metabolic Panel: Recent Labs  Lab 01/03/18 0546  NA 136  K 4.4  CL 106  CO2 19*  GLUCOSE 180*  BUN 23*  CREATININE 0.98  CALCIUM 10.2   GFR: Estimated Creatinine Clearance: 86.6 mL/min (by C-G formula based on SCr of 0.98  mg/dL). Liver Function Tests: Recent Labs  Lab 01/03/18 1100  AST 39  ALT 37  ALKPHOS 37*  BILITOT 0.8  PROT 7.8  ALBUMIN 4.1   No results for input(s): LIPASE, AMYLASE in the last 168 hours. No results for input(s): AMMONIA in the last 168 hours. Coagulation Profile: No results for input(s): INR, PROTIME in the last 168 hours. Cardiac Enzymes: No results for input(s): CKTOTAL, CKMB, CKMBINDEX, TROPONINI in the last 168 hours. BNP (last 3 results) No results for input(s): PROBNP in the last 8760 hours. HbA1C: No results for input(s): HGBA1C in the last 72 hours. CBG: No results for input(s): GLUCAP in the last 168 hours. Lipid Profile: No results for input(s): CHOL, HDL, LDLCALC, TRIG, CHOLHDL, LDLDIRECT in the last 72 hours. Thyroid Function Tests: No results for input(s): TSH, T4TOTAL, FREET4, T3FREE, THYROIDAB in the last 72 hours. Anemia Panel: No results for input(s): VITAMINB12, FOLATE, FERRITIN, TIBC, IRON, RETICCTPCT in the last 72 hours. Urine analysis:    Component Value Date/Time   COLORURINE YELLOW 01/03/2018 [place] 01/03/2018 0557   LABSPEC 1.020 01/03/2018 0557   PHURINE 5.0 01/03/2018 0557   GLUCOSEU NEGATIVE 01/03/2018 0557   HGBUR SMALL (A) 01/03/2018 0557   BILIRUBINUR NEGATIVE 01/03/2018 0557   BILIRUBINUR n 06/08/2016 1109   KETONESUR NEGATIVE 01/03/2018 0557   PROTEINUR NEGATIVE 01/03/2018 0557   UROBILINOGEN negative 06/08/2016 1109   NITRITE NEGATIVE 01/03/2018 0557   LEUKOCYTESUR TRACE (A) 01/03/2018 0557    Creatinine Clearance: Estimated Creatinine Clearance: 86.6 mL/min (by C-G formula based on SCr of 0.98 mg/dL).  Sepsis Labs: _0 (procalcitonin:4,lacticidven:4) )No results found for this or any previous visit (from the past 240 hour(s)).   Radiological Exams on Admission: Dg Chest 2 View  Result Date: 01/03/2018 CLINICAL DATA:  Shortness of breath. EXAM: CHEST  2 VIEW COMPARISON:  Radiographs of March 17, 2015. FINDINGS: Stable cardiomediastinal silhouette. No pneumothorax or pleural effusion is noted. Mild diffuse interstitial densities are noted which may represent chronic scarring, but acute superimposed edema or inflammation cannot be excluded. Bony  thorax is unremarkable. IMPRESSION: Mild diffuse interstitial densities are noted which may represent scarring, but acute superimposed edema or inflammation cannot be excluded. Electronically Signed   By: Marijo Conception, M.D.   On: 01/03/2018 08:55   Ct Angio Chest/abd/pel For Dissection W And/or Wo Contrast  Result Date: 01/03/2018 CLINICAL DATA:  Evaluate for dissection.  Right-sided flank pain. EXAM: CT ANGIOGRAPHY CHEST, ABDOMEN AND PELVIS TECHNIQUE: Multidetector CT imaging through the chest, abdomen and pelvis was performed using the standard protocol during bolus administration of intravenous contrast. Multiplanar reconstructed images and MIPs were obtained and reviewed to evaluate the vascular anatomy. CONTRAST:  151m ISOVUE-370 IOPAMIDOL (ISOVUE-370) INJECTION 76% COMPARISON:  CT abdomen pelvis from 06/15/2016. FINDINGS: CTA CHEST FINDINGS Cardiovascular: Preferential opacification of the thoracic aorta. Aortic atherosclerosis. Calcification in the left main coronary artery identified. No evidence of thoracic aortic aneurysm or dissection. Normal heart size. No pericardial effusion. Mediastinum/Nodes: The trachea appears patent and is midline. Normal appearance of the esophagus. Borderline sub- carinal lymph node measures 1.2 cm, image 73 of series 6. No hilar adenopathy. No enlarged axillary or supraclavicular adenopathy. Lungs/Pleura: No pleural effusion. Moderate to severe changes of emphysema identified. No airspace consolidation or atelectasis. Dependent changes are noted within both [name] bases posteriorly. There is a small nodule within the anterolateral left upper lobe measuring 4 mm, image 38 of series 7. Musculoskeletal: Degenerative disc  disease identified within the thoracic spine. No aggressive lytic or sclerotic bone lesions. Review of the MIP images confirms the above findings. CTA ABDOMEN AND PELVIS FINDINGS VASCULAR Aorta: Normal caliber aorta without aneurysm, dissection, vasculitis or significant stenosis. Celiac: Patent without evidence of aneurysm, dissection, vasculitis or significant stenosis. SMA: Patent without evidence of aneurysm, dissection, vasculitis or significant stenosis. Renals: Both renal arteries are patent without evidence of aneurysm, dissection, vasculitis, fibromuscular dysplasia or significant stenosis. IMA: Patent without evidence of aneurysm, dissection, vasculitis or significant stenosis. Inflow: Patent without evidence of aneurysm, dissection, vasculitis or significant stenosis. Veins: No obvious venous abnormality within the limitations of this arterial phase study. Review of the MIP images confirms the above findings. NON-VASCULAR Hepatobiliary: The liver has a nodular contour with relative hypertrophy of the lateral segment and left lobe of liver. The gallbladder appears normal. No biliary dilatation. Pancreas: Unremarkable. No pancreatic ductal dilatation or surrounding inflammatory changes. Spleen: Enlarged measuring 13.9 cm in length. Adrenals/Urinary Tract: The adrenal glands appear normal. The kidneys are both normal. No mass or hydronephrosis. Stone within the left posterior aspect of the urinary bladder measures 3 mm. Diverticula arises from the left posterior bladder measuring 1.9 cm. Stomach/Bowel: Normal appearance of the stomach. The small bowel loops have a normal course and caliber. No bowel obstruction. Normal appearance of the colon. Lymphatic: No adenopathy within the upper abdomen. No pelvic or inguinal adenopathy. Reproductive: Seed implants are identified within the prostate gland. Other: There is no ascites or focal fluid collections within the abdomen or pelvis. Musculoskeletal: Degenerative  disc disease identified within the lumbar spine. No aggressive lytic or sclerotic bone lesions. Review of the MIP images confirms the above findings. IMPRESSION: 1. No acute findings.  No evidence for aortic dissection. 2. Aortic Atherosclerosis (ICD10-I70.0) and Emphysema (ICD10-J43.9). 3. Pulmonary nodule in the left upper lobe measures 4 mm. No follow-up needed if patient is low-risk. Non-contrast chest CT can be considered in 12 months if patient is high-risk. This recommendation follows the consensus statement: Guidelines for Management of Incidental Pulmonary Nodules Detected on CT Images: From the Fleischner Society 2017;  Radiology 2017; 284:228-243. 4. There is a small stone identified within the urinary bladder measuring 3 mm. No hydronephrosis or hydroureter identified at this time. 5. Morphologic feature the liver compatible with cirrhosis. Splenomegaly is identified. Electronically Signed   By: Kerby Moors M.D.   On: 01/03/2018 12:21    EKG: Independently reviewed. Normal sinus rhythm Normal ECG  Assessment/Plan Principal Problem:   Acute respiratory failure (HCC) Active Problems:   CAD (coronary artery disease)   Morbid obesity (HCC)   History of prostate cancer   Sleep apnea   Essential hypertension   Type II diabetes mellitus with complication (HCC)   Personal history of noncompliance with medical treatment, presenting hazards to health   Dyspnea on exertion   Pain   Kidney stone   Abnormal CT of the abdomen   #1. Acute respiratory failure with hypoxia likely related to morphine side effects in the setting of right flank pain secondary to urinary bladder stone. Chest x-ray with edema versus inflammation. CT of the chest reveals no dissection no acute abnormality. Does note small pulmonary nodule as well as splenomegaly. -Admit to telemetry -Scheduled nebulizers -brief steroids -Continue supplemental oxygen -Monitor oxygen saturation level  #2. Right flank pain/urinary  bladder stone per CT. Pain improved on admission. -Pain management with oral meds -Low-dose Dilaudid for breakthrough -Strain urine -Outpatient follow-up with urology  #3. Dyspnea on exertion. Patient with a six-month history of same. Worsening today after narcotic for pain. Chart review indicates cardiology and PCP workup in progress. Echo unremarkable EKG without acute changes. Stress test scheduled for 5 days from now.  -See #1 -Monitor  #4. CAD. No chest pain. EKG without acute changes. Chart review indicates patient had a nuclear stress test 2011 was normal. He sent echo reveals an EF 52-77% grade 1 diastolic dysfunction. -Has outpatient stress test scheduled -Continue home meds  #5. Diabetes. Serum glucose 180. -Obtain hemoglobin A1c -Sliding scale insulin for optimal control -Hold metformin  #6. Hypertension. Fair control in the emergency department. Home medications include ramapril -Continue home meds  #7. Obstructive sleep apnea. Patient has been provided with C Pap. He is noncompliant.  #8. Obesity. BMI 39.8. Contributing to #1. -Nutritional consult  #9. Abnormal abdominal CT. CT morphological features of the liver compatible with cirrhosis. Liver function tests within the limits of normal. Denies EtOH abuse -OP follow up   DVT prophylaxis: lovenox Code Status: full  Family Communication: wife at bedside  Disposition Plan: home hopefully 24 hours  Consults called: none  Admission status: obs    Radene Gunning MD [facility]  If 7PM-7AM, please contact night-coverage [email or web address] Password [email or web address]  01/03/2018, 5:12 PM

## 2018-01-03 NOTE — ED Provider Notes (Signed)
[facility]ERG[facility] DEPARTMENT Provider Note   CSN: [id number] Arrival date & time: 01/03/18  0532     History   Chief Complaint Chief Complaint  Patient presents with  . Flank Pain    HPI DABID GODOWN is a 72 y.o. male with a history of Dm Type II, CAD, prostate CA, HTN, obesity, OSA, and PAD who presents to the emergency department with a chief complaint of right flank and abdominal pain. The non-radiating, constant pain began yesterday afternoon as a mild, achy, dull pain that improved when pressure was applied to the area. He reports the pain awoke him from sleep last night at 2 AM as severe, sharp pain that "felt like something was exploding in him". He also endorses dyspnea and pleuritic chest pain that began this morning.  He reports the flank pain has somewhat improved in intensity, but is aggravated when he takes a deep breath.  He denies that pain intensifies with positional changes, but reports increased pain with walking.  His wife states "I've been married to him for 40 years, and he's never asked to come to the hospital until today."  He denies back pain, cough, nausea, vomiting, diarrhea, dysuria, hematuria, melena, hematochezia, penile or testicular pain or swelling, lower extremity edema, or rash.    No treatment prior to arrival.  He is followed by cardiology.  Heart catheterization in 2000 showing nonobstructive CAD, but he was felt to have a probable dissection of the RCA.[name] He had a normal nuclear stress test in 2011, but is scheduled for an upcoming repeat nuclear stress test on 01/08/18. ECHO on 12/20 with EF of 55-60% and grade 1 diastolic dysfunction.  The history is provided by the patient. No language interpreter was used.    Past Medical History:  Diagnosis Date  . Astigmatism of both eyes 11/01/2016  . Bronchitis   . Cortical age-related cataract of both eyes 11/01/2016  . Drug-induced osteoporosis   . Dyslipidemia   . Fatigue   .  Fatty liver   . Hypercholesteremia   . Hypogonadism male   . 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 Normal nuclear stress test March 2011  . Nuclear sclerotic cataract of both eyes 11/01/2016  . Obesity   . Prostate cancer [facility])    s/p seed implant and radiation  . Sleep apnea   . Vitamin D insufficiency     Patient Active Problem List   Diagnosis Date Noted  . Acute respiratory failure ([place]) 01/03/2018  . Pain 01/03/2018  . Kidney stone 01/03/2018  . Serum calcium elevated 12/16/2017  . Dyspnea on exertion 12/13/2017  . Personal history of noncompliance with medical treatment, presenting hazards to health 07/26/2016  . Type II diabetes mellitus with complication ([place]) 40/98/1191  . Osteoporosis due to androgen therapy 09/16/2012  . CAD (coronary artery disease) 08/29/2011  . Morbid obesity ([place]) 08/29/2011  . History of prostate cancer 08/29/2011  . Sleep apnea 08/29/2011  . Essential hypertension 08/29/2011  . Mixed hyperlipidemia 08/29/2011    Past Surgical History:  Procedure Laterality Date  . CARDIAC CATHETERIZATION  April 2000  . RADIOACTIVE SEED IMPLANT  2009   Implanted radiation seeds       Home Medications    Prior to Admission medications   Medication Sig Start Date End Date Taking? Authorizing Provider  ASPIRIN PO Take 81 mg by mouth  daily.    Yes [provider]  cholecalciferol (VITAMIN D) 1000 UNITS tablet Take 2,000 Units by mouth daily.    Yes [provider]  Coenzyme Q10 (CO Q 10) 100 MG CAPS Take 1 tablet by mouth daily.   Yes [provider]  ezetimibe (ZETIA) 10 MG tablet Take 10 mg by mouth daily.   Yes [provider]  fenofibrate 160 MG tablet Take 1 tablet (160 mg total) by mouth daily. Please keep upcoming appt  In December for future refills. Thank you 11/28/17  Yes Larey Dresser, MD  LIVALO 4 MG TABS TAKE 1  TABLET BY MOUTH EVERY DAY 01/11/17  Yes [name], MD  Magnesium 250 MG TABS Take 200 mg by mouth.    Yes [provider]  metFORMIN (GLUCOPHAGE) 1000 MG tablet TAKE 1 TABLET BY MOUTH TWICE A DAY WITH A MEAL 11/06/17  Yes [name], MD  Multiple Vitamins-Minerals (MULTIVITAMIN WITH MINERALS) tablet Take 1 tablet by mouth daily.     Yes [provider]  naproxen sodium (ALEVE) 220 MG tablet Take 220 mg by mouth daily as needed (pain).   Yes [provider]  omega-3 acid ethyl esters (LOVAZA) 1 g capsule TAKE 4 CAPSULES BY MOUTH EVERY DAY 11/05/17  Yes [name], MD  ramipril (ALTACE) 5 MG capsule Take 1 capsule (5 mg total) daily by mouth. Please keep upcoming appt for future refills. Thank you 11/07/17  Yes Larey Dresser, MD  VICTOZA 18 MG/3ML SOPN INJECT 1.2 MG INTO THE SKIN DAILY. 08/22/17  Yes [name], MD  B-D UF III MINI PEN NEEDLES 31G X 5 MM MISC USE AS DIRECTED EVERY DAY 08/22/17   [name], MD  blood glucose meter kit and supplies Dispense One touch ultra meter. E11.9 11/06/17   [name], MD  ONE TOUCH ULTRA TEST test strip USE TO TEST BLOOD SUGAR DAILY AS DIRECTED BY DOCTOR 10/28/17   [name], MD  [facility] DELICA LANCETS 62G MISC TEST ONCE DAILY AS DIRECTED 11/20/16   [name], MD  [facility] DELICA LANCETS 31D [place] TEST ONCE DAILY AS DIRECTED 07/29/17   [name], MD    Family History Family History  Problem Relation Age of Onset  . COPD Father     Social History Social History   Tobacco Use  . Smoking status: Former Smoker    Last attempt to quit: 08/20/1999    Years since quitting: 18.3  . Smokeless tobacco: Never Used  Substance Use Topics  . Alcohol use: Yes    Alcohol/week: 0.6 oz    Types: 1 Shots of liquor per week  . Drug use: No     Allergies   Crestor [rosuvastatin calcium]; Lipitor [atorvastatin calcium]; and Plavix [clopidogrel bisulfate]   Review of Systems Review of  Systems  Constitutional: Negative for activity change, chills and fever.  HENT: Negative for congestion.   Eyes: Negative for visual disturbance.  Respiratory: Positive for shortness of breath.   Cardiovascular: Positive for chest pain. Negative for leg swelling.  Gastrointestinal: Positive for abdominal pain. Negative for blood in stool, diarrhea, nausea, rectal pain and vomiting.  Genitourinary: Positive for flank pain. Negative for dysuria, penile pain, penile swelling, scrotal swelling and testicular pain.  Musculoskeletal: Positive for neck pain. Negative for back pain.  Skin: Negative for rash.  Allergic/Immunologic: Positive for immunocompromised state.  Neurological: Negative for weakness.  Psychiatric/Behavioral: Negative for confusion.  Physical Exam Updated Vital Signs BP 122/67   Pulse 88   Temp 99.2 F (37.3 C) (Oral)   Resp 20   Ht _0  (1.727 m)   Wt 118.8 kg (262 lb)   SpO2 94%   BMI 39.84 kg/m   Physical Exam  Constitutional: He is oriented to person, place, and time. He appears well-developed.  Obese male, uncomfortable.   HENT:  Head: Normocephalic.  Eyes: Conjunctivae and EOM are normal. Pupils are equal, round, and reactive to light. No scleral icterus.  Neck: Normal range of motion. Neck supple.  Cardiovascular: Normal rate, regular rhythm and normal heart sounds. Exam reveals no gallop and no friction rub.  No murmur heard. Radial, DP, PT pulses are 2+ and symmetric.  No palpable thrills.  No bruits auscultated.  Pulmonary/Chest: Effort normal. No respiratory distress. He has no wheezes. He exhibits no tenderness.  Crackles heard in the bilateral bases.   Abdominal: Soft. Bowel sounds are normal. He exhibits no shifting dullness, no distension, no abdominal bruit, no pulsatile midline mass and no mass. There is tenderness. There is guarding. There is no rigidity and no rebound. No hernia.  Protuberant, distended abdomen. Soft.  Tender to palpation  in the right upper and lower quadrants. Guarding with palpation of the RUQ. No tenderness over McBurney's point.  Negative Murphy sign.  No left upper and lower quadrant tenderness to palpation no left CVA tenderness.  No peritoneal signs.  Healing ecchymosis noted to the periumbilical skin.  The patient states this is from his Victoza injections.  Musculoskeletal: He exhibits tenderness.  Mild nonpitting edema to the bilateral ankles.   Tender to palpation over the right lumbar musculature extending from the right CVA to the right ASIS.  No tender to palpation over the left lumbar musculature.  No overlying erythema, edema, or warmth.  Neurological: He is alert and oriented to person, place, and time.  Skin: Skin is warm and dry. Capillary refill takes less than 2 seconds. No rash noted. No erythema. No pallor.  Psychiatric: His behavior is normal.  Nursing note and vitals reviewed.  ED Treatments / Results  Labs (all labs ordered are listed, but only abnormal results are displayed) Labs Reviewed  URINALYSIS, ROUTINE W REFLEX MICROSCOPIC - Abnormal; Notable for the following components:      Result Value   Hgb urine dipstick SMALL (*)    Leukocytes, UA TRACE (*)    Bacteria, UA FEW (*)    All other components within normal limits  BASIC METABOLIC PANEL - Abnormal; Notable for the following components:   CO2 19 (*)    Glucose, Bld 180 (*)    BUN 23 (*)    All other components within normal limits  CBC - Abnormal; Notable for the following components:   Platelets 148 (*)    All other components within normal limits  HEPATIC FUNCTION PANEL - Abnormal; Notable for the following components:   Alkaline Phosphatase 37 (*)    All other components within normal limits  BRAIN NATRIURETIC PEPTIDE  HEMOGLOBIN A1C  I-STAT TROPONIN, ED  I-STAT TROPONIN, ED    EKG  EKG Interpretation None       Radiology Dg Chest 2 View  Result Date: 01/03/2018 CLINICAL DATA:  Shortness of breath.  EXAM: CHEST  2 VIEW COMPARISON:  Radiographs of March 17, 2015. FINDINGS: Stable cardiomediastinal silhouette. No pneumothorax or pleural effusion is noted. Mild diffuse interstitial densities are noted which may represent chronic scarring, but acute superimposed  edema or inflammation cannot be excluded. Bony thorax is unremarkable. IMPRESSION: Mild diffuse interstitial densities are noted which may represent scarring, but acute superimposed edema or inflammation cannot be excluded. Electronically Signed   By: Marijo Conception, M.D.   On: 01/03/2018 08:55   Ct Angio Chest/abd/pel For Dissection W And/or Wo Contrast  Result Date: 01/03/2018 CLINICAL DATA:  Evaluate for dissection.  Right-sided flank pain. EXAM: CT ANGIOGRAPHY CHEST, ABDOMEN AND PELVIS TECHNIQUE: Multidetector CT imaging through the chest, abdomen and pelvis was performed using the standard protocol during bolus administration of intravenous contrast. Multiplanar reconstructed images and MIPs were obtained and reviewed to evaluate the vascular anatomy. CONTRAST:  157m ISOVUE-370 IOPAMIDOL (ISOVUE-370) INJECTION 76% COMPARISON:  CT abdomen pelvis from 06/15/2016. FINDINGS: CTA CHEST FINDINGS Cardiovascular: Preferential opacification of the thoracic aorta. Aortic atherosclerosis. Calcification in the left main coronary artery identified. No evidence of thoracic aortic aneurysm or dissection. Normal heart size. No pericardial effusion. Mediastinum/Nodes: The trachea appears patent and is midline. Normal appearance of the esophagus. Borderline sub- carinal lymph node measures 1.2 cm, image 73 of series 6. No hilar adenopathy. No enlarged axillary or supraclavicular adenopathy. Lungs/Pleura: No pleural effusion. Moderate to severe changes of emphysema identified. No airspace consolidation or atelectasis. Dependent changes are noted within both lung bases posteriorly. There is a small nodule within the anterolateral left upper lobe measuring 4 mm, image  38 of series 7. Musculoskeletal: Degenerative disc disease identified within the thoracic spine. No aggressive lytic or sclerotic bone lesions. Review of the MIP images confirms the above findings. CTA ABDOMEN AND PELVIS FINDINGS VASCULAR Aorta: Normal caliber aorta without aneurysm, dissection, vasculitis or significant stenosis. Celiac: Patent without evidence of aneurysm, dissection, vasculitis or significant stenosis. SMA: Patent without evidence of aneurysm, dissection, vasculitis or significant stenosis. Renals: Both renal arteries are patent without evidence of aneurysm, dissection, vasculitis, fibromuscular dysplasia or significant stenosis. IMA: Patent without evidence of aneurysm, dissection, vasculitis or significant stenosis. Inflow: Patent without evidence of aneurysm, dissection, vasculitis or significant stenosis. Veins: No obvious venous abnormality within the limitations of this arterial phase study. Review of the MIP images confirms the above findings. NON-VASCULAR Hepatobiliary: The liver has a nodular contour with relative hypertrophy of the lateral segment and left lobe of liver. The gallbladder appears normal. No biliary dilatation. Pancreas: Unremarkable. No pancreatic ductal dilatation or surrounding inflammatory changes. Spleen: Enlarged measuring 13.9 cm in length. Adrenals/Urinary Tract: The adrenal glands appear normal. The kidneys are both normal. No mass or hydronephrosis. Stone within the left posterior aspect of the urinary bladder measures 3 mm. Diverticula arises from the left posterior bladder measuring 1.9 cm. Stomach/Bowel: Normal appearance of the stomach. The small bowel loops have a normal course and caliber. No bowel obstruction. Normal appearance of the colon. Lymphatic: No adenopathy within the upper abdomen. No pelvic or inguinal adenopathy. Reproductive: Seed implants are identified within the prostate gland. Other: There is no ascites or focal fluid collections within  the abdomen or pelvis. Musculoskeletal: Degenerative disc disease identified within the lumbar spine. No aggressive lytic or sclerotic bone lesions. Review of the MIP images confirms the above findings. IMPRESSION: 1. No acute findings.  No evidence for aortic dissection. 2. Aortic Atherosclerosis (ICD10-I70.0) and Emphysema (ICD10-J43.9). 3. Pulmonary nodule in the left upper lobe measures 4 mm. No follow-up needed if patient is low-risk. Non-contrast chest CT can be considered in 12 months if patient is high-risk. This recommendation follows the consensus statement: Guidelines for Management of Incidental Pulmonary Nodules Detected on  CT Images: From the Fleischner Society 2017; Radiology 2017; 284:228-243. 4. There is a small stone identified within the urinary bladder measuring 3 mm. No hydronephrosis or hydroureter identified at this time. 5. Morphologic feature the liver compatible with cirrhosis. Splenomegaly is identified. Electronically Signed   By: Kerby Moors M.D.   On: 01/03/2018 12:21    Procedures Procedures (including critical care time)  Medications Ordered in ED Medications  aspirin chewable tablet 81 mg (not administered)  ezetimibe (ZETIA) tablet 10 mg (not administered)  fenofibrate tablet 160 mg (not administered)  ramipril (ALTACE) capsule 5 mg (not administered)  enoxaparin (LOVENOX) injection 40 mg (not administered)  0.9 %  sodium chloride infusion (not administered)  acetaminophen (TYLENOL) tablet 650 mg (not administered)    Or  acetaminophen (TYLENOL) suppository 650 mg (not administered)  HYDROcodone-acetaminophen (NORCO/VICODIN) 5-325 MG per tablet 1-2 tablet (not administered)  ondansetron (ZOFRAN) tablet 4 mg (not administered)    Or  ondansetron (ZOFRAN) injection 4 mg (not administered)  bisacodyl (DULCOLAX) EC tablet 5 mg (not administered)  albuterol (PROVENTIL) (2.5 MG/3ML) 0.083% nebulizer solution 2.5 mg (not administered)  albuterol (PROVENTIL) (2.5  MG/3ML) 0.083% nebulizer solution 2.5 mg (not administered)  insulin aspart (novoLOG) injection 0-15 Units (not administered)  insulin aspart (novoLOG) injection 0-5 Units (not administered)  HYDROmorphone (DILAUDID) injection 0.5 mg (not administered)  morphine 4 MG/ML injection 4 mg (4 mg Intravenous Given 01/03/18 1118)  iopamidol (ISOVUE-370) 76 % injection (100 mLs  Contrast Given 01/03/18 1151)  morphine 4 MG/ML injection 4 mg (4 mg Intravenous Given 01/03/18 1559)     Initial Impression / Assessment and Plan / ED Course  I have reviewed the triage vital signs and the nursing notes.  Pertinent labs & imaging results that were available during my care of the patient were reviewed by me and considered in my medical decision making (see chart for details).     72 year old male with a history of Dm Type II, CAD, prostate CA, HTN, obesity, OSA, and PAD presenting with right flank pain, pleuritic chest pain, and dyspnea.  The patient was seen and evaluated with Dr. Jeanell Sparrow, attending physician who is in agreement with the workup and plan  On physical exam, the patient is tender to palpation in the right upper and lower quadrant. Guarding on exam in the right upper quadrant, no Murphy sign.  Crackles noted in the bilateral bases of the lungs.   UA with hgb, but unremarkable for infection. CMP with bicarb of 19 and glu of 180. No anion gap and other wise unremarkable. CXR with mild diffuse interstitial densities concerning for scarring versus acute superimposed edema versus inflammation.  Given chest abdominal symptoms and history of possible RCA dissection, CT angio ordered, demonstrating 3 mm stone in the urinary bladder without obstruction, liver cirrhosis, spleen megaly, and 4 mm pulmonary nodule in the left upper lobe.  No evidence of aortic dissection.  These findings have been discussed with the patient.  Initial plan was to discharge the patient with a strainer, Toradol for pain control, and  follow-up with urology for urinary bladder stone.  The patient was given 4 mg of morphine at 11:18, on re-evaluation prior to discharge, the patient was found to have an SaO2 on RA at around 1400 at 90% and placed on 2L [place], improving to 94-95%. The patient was ambulated on pulse ox with SaO2 of 85-86% on RA. Doubt morphine is responsible for the patient's hypoxemia since the IV medication peaks in around 20  minutes and the half-life of the medication is elimination is 2-4 hours.  BNP is not elevated. Troponin is negative; repeat troponin pending. No PE noted on angio CT. physical exam, the patient has crackles in the bilateral lung bases, no pneumonia on CT or CXR. Possible early infection vs obesity hypoventilation syndrome vs   He was seen by Dr. Saunders Revel, cardiologist, on 12/11/17 for dyspnea on exertion that has been gradually progressing over the last 6 months.  SaO2 97% on RA during his visit. He was noted to be euvolemic despite body habitus and an ECHO, which was unremarkable, so he is scheduled for a myocardial perfusion stress test on 01/08/18.  He reports a history of a heart catheterization in the early 2000's. He denies recent symptoms consistent with stable or unstable angina.  Spoke with Dyanne Carrel, NP, who will admit the patient for pain control and hypoxia. The patient appears reasonably stabilized for admission considering the current resources, flow, and capabilities available in the ED at this time, and I doubt any other [facility] requiring further screening and/or treatment in the ED prior to admission.   Final Clinical Impressions(s) / ED Diagnoses   Final diagnoses:  Calculus of urinary bladder  Hypoxia    ED Discharge Orders    None       Joanne Gavel, PA-C 01/03/18 1633    Pattricia Boss, MD 01/03/18 1657

## 2018-01-04 ENCOUNTER — Encounter (HOSPITAL_COMMUNITY): Payer: Self-pay | Admitting: *Deleted

## 2018-01-04 DIAGNOSIS — I10 Essential (primary) hypertension: Secondary | ICD-10-CM | POA: Diagnosis not present

## 2018-01-04 DIAGNOSIS — R0609 Other forms of dyspnea: Secondary | ICD-10-CM | POA: Diagnosis not present

## 2018-01-04 DIAGNOSIS — E118 Type 2 diabetes mellitus with unspecified complications: Secondary | ICD-10-CM | POA: Diagnosis not present

## 2018-01-04 DIAGNOSIS — R0902 Hypoxemia: Secondary | ICD-10-CM | POA: Diagnosis not present

## 2018-01-04 LAB — CBC
HCT: 40.2 % (ref 39.0–52.0)
Hemoglobin: 12.9 g/dL — ABNORMAL LOW (ref 13.0–17.0)
MCH: 30.1 pg (ref 26.0–34.0)
MCHC: 32.1 g/dL (ref 30.0–36.0)
MCV: 93.9 fL (ref 78.0–100.0)
Platelets: 130 10*3/uL — ABNORMAL LOW (ref 150–400)
RBC: 4.28 MIL/uL (ref 4.22–5.81)
RDW: 14.5 % (ref 11.5–15.5)
WBC: 6.9 10*3/uL (ref 4.0–10.5)

## 2018-01-04 LAB — GLUCOSE, CAPILLARY
Glucose-Capillary: 225 mg/dL — ABNORMAL HIGH (ref 65–99)
Glucose-Capillary: 257 mg/dL — ABNORMAL HIGH (ref 65–99)

## 2018-01-04 LAB — BASIC METABOLIC PANEL
Anion gap: 13 (ref 5–15)
BUN: 25 mg/dL — ABNORMAL HIGH (ref 6–20)
CO2: 21 mmol/L — ABNORMAL LOW (ref 22–32)
Calcium: 9.4 mg/dL (ref 8.9–10.3)
Chloride: 102 mmol/L (ref 101–111)
Creatinine, Ser: 1.08 mg/dL (ref 0.61–1.24)
GFR calc Af Amer: >60 mL/min (ref >60)
GFR calc non Af Amer: >60 mL/min (ref >60)
Glucose, Bld: 264 mg/dL — ABNORMAL HIGH (ref 65–99)
Potassium: 4.6 mmol/L (ref 3.5–5.1)
Sodium: 136 mmol/L (ref 135–145)

## 2018-01-04 NOTE — Discharge Summary (Signed)
Physician Discharge Summary  KOLTAN PORTOCARRERO FAO:[id number] DOB: [birth year] DOA: 01/03/2018  PCP: [name], MD  Admit date: 01/03/2018 Discharge date: 01/04/2018  Admitted From: Home  Disposition:  Home   Recommendations for Outpatient Follow-up:  1. Follow up with PCP in 2 weeks 2. Please consider spirometry or referral for PFTs at follow up appointment  Home Health: None  Equipment/Devices: None  Discharge Condition: Good  CODE STATUS: FULL Diet recommendation: Diabetic  Brief/Interim Summary: [name] is a 72 yo M with CAD, hx pros CA, DM, and HTN who presents with flank pain, found to have 65m stone, given morphine in the ER, subsequently hypoxic, whom we were asked to evaluate for hypoxia.     Hypoxia This was from morphine use in the context of obesity and OSA.  Resolved this morning, patient O2 sat 94% on room air, 90-92% with ambulation.  Dyspnea at baseline.  In context of CTA chest without PE or pneumonia, and normal O2 sat now, this was clearly from morphine.     Possible COPD Patient reports relatively new onset, over last 1-2 months, dyspne on exertion.  He was seen by his Cardiologist for routine follow up recently, noted this, and cardiac work up is in process (so far, echo normal, nuc med pending).  However, given his >15 year hx of smoking, age, and previous response to steroids in the context of acute bronchitis, I wonder if he has underlying airflow obstruction.  Currently he has scattered wheezes, but no dyspnea or full blown COPD exacerbation, so although he was given a dose of steroids in the ER, I will not continue them at discharge. -Follow up with PCP in 2 weeks for ?spiro vs PFTs, sooner for worsening dyspnea       Discharge Diagnoses:  Principal Problem:   Acute respiratory failure ([place] Active Problems:   CAD (coronary artery disease)   Morbid obesity ([place]   History of prostate cancer   Sleep apnea   Essential hypertension   Type II  diabetes mellitus with complication ([place]   Personal history of noncompliance with medical treatment, presenting hazards to health   Dyspnea on exertion   Pain   Kidney stone   Abnormal CT of the abdomen    Discharge Instructions  Discharge Instructions    Diet Carb Modified   Complete by:  As directed    Discharge instructions   Complete by:  As directed    From Dr. DLoleta Books You were admitted for low oxygen levels after having morphine.  Morphine and other opiates (like hydrocodone or others) can suppress the breathing, and for someone with sleep apnea, or possibly mild COPD or other chronic lung disease, it is normal for the oxygen level to drop.    This morning it was fine.  Please follow up with Dr. JJenny Reichmannin 2 weeks for your previously scheduled appointment.  Ask him at that time to consider testing for COPD.  In the meantime, call his office sooner, or seek medical care if you have worsening breathing difficulty, wheezing, coughing, or feeling out of breath.    For pain, take acetaminophen 1000 mg up to three times per day.  Be cautious with use of naproxen, and stop your ramipril for a few days if you take more than 1 naproxen per day.   Increase activity slowly   Complete by:  As directed      Allergies as of 01/04/2018      Reactions   Crestor [[employer]  Calcium]    Lipitor [atorvastatin Calcium] Other (See Comments)   MUSCLE PAINS   Plavix [clopidogrel Bisulfate] Hives      Medication List    TAKE these medications   ASPIRIN PO Take 81 mg by mouth daily.   B-D UF III MINI PEN NEEDLES 31G X 5 MM Misc Generic drug:  Insulin Pen Needle USE AS DIRECTED EVERY DAY   blood glucose meter kit and supplies Dispense One touch ultra meter. E11.9   cholecalciferol 1000 units tablet Commonly known as:  VITAMIN D Take 2,000 Units by mouth daily.   Co Q 10 100 MG Caps Take 1 tablet by mouth daily.   ezetimibe 10 MG tablet Commonly known as:  ZETIA Take 10 mg by  mouth daily.   fenofibrate 160 MG tablet Take 1 tablet (160 mg total) by mouth daily. Please keep upcoming appt  In December for future refills. Thank you   LIVALO 4 MG Tabs Generic drug:  Pitavastatin Calcium TAKE 1 TABLET BY MOUTH EVERY DAY   Magnesium 250 MG Tabs Take 200 mg by mouth.   metFORMIN 1000 MG tablet Commonly known as:  GLUCOPHAGE TAKE 1 TABLET BY MOUTH TWICE A DAY WITH A MEAL   multivitamin with minerals tablet Take 1 tablet by mouth daily.   naproxen sodium 220 MG tablet Commonly known as:  ALEVE Take 220 mg by mouth daily as needed (pain).   omega-3 acid ethyl esters 1 g capsule Commonly known as:  LOVAZA TAKE 4 CAPSULES BY MOUTH EVERY DAY   ONE TOUCH ULTRA TEST test strip Generic drug:  glucose blood USE TO TEST BLOOD SUGAR DAILY AS DIRECTED BY DOCTOR   ONETOUCH DELICA LANCETS 47W Misc TEST ONCE DAILY AS DIRECTED   ONETOUCH DELICA LANCETS 29F Misc TEST ONCE DAILY AS DIRECTED   ramipril 5 MG capsule Commonly known as:  ALTACE Take 1 capsule (5 mg total) daily by mouth. Please keep upcoming appt for future refills. Thank you   VICTOZA 18 MG/3ML Sopn Generic drug:  liraglutide INJECT 1.2 MG INTO THE SKIN DAILY.       Allergies  Allergen Reactions  . Crestor [Rosuvastatin Calcium]   . Lipitor [Atorvastatin Calcium] Other (See Comments)    MUSCLE PAINS   . Plavix [Clopidogrel Bisulfate] Hives    Consultations:  None   Procedures/Studies: Dg Chest 2 View  Result Date: 01/03/2018 CLINICAL DATA:  Shortness of breath. EXAM: CHEST  2 VIEW COMPARISON:  Radiographs of March 17, 2015. FINDINGS: Stable cardiomediastinal silhouette. No pneumothorax or pleural effusion is noted. Mild diffuse interstitial densities are noted which may represent chronic scarring, but acute superimposed edema or inflammation cannot be excluded. Bony thorax is unremarkable. IMPRESSION: Mild diffuse interstitial densities are noted which may represent scarring, but acute  superimposed edema or inflammation cannot be excluded. Electronically Signed   By: Marijo Conception, M.D.   On: 01/03/2018 08:55   Ct Angio Chest/abd/pel For Dissection W And/or Wo Contrast  Result Date: 01/03/2018 CLINICAL DATA:  Evaluate for dissection.  Right-sided flank pain. EXAM: CT ANGIOGRAPHY CHEST, ABDOMEN AND PELVIS TECHNIQUE: Multidetector CT imaging through the chest, abdomen and pelvis was performed using the standard protocol during bolus administration of intravenous contrast. Multiplanar reconstructed images and MIPs were obtained and reviewed to evaluate the vascular anatomy. CONTRAST:  190m ISOVUE-370 IOPAMIDOL (ISOVUE-370) INJECTION 76% COMPARISON:  CT abdomen pelvis from 06/15/2016. FINDINGS: CTA CHEST FINDINGS Cardiovascular: Preferential opacification of the thoracic aorta. Aortic atherosclerosis. Calcification in the left main coronary  artery identified. No evidence of thoracic aortic aneurysm or dissection. Normal heart size. No pericardial effusion. Mediastinum/Nodes: The trachea appears patent and is midline. Normal appearance of the esophagus. Borderline sub- carinal lymph node measures 1.2 cm, image 73 of series 6. No hilar adenopathy. No enlarged axillary or supraclavicular adenopathy. Lungs/Pleura: No pleural effusion. Moderate to severe changes of emphysema identified. No airspace consolidation or atelectasis. Dependent changes are noted within both lung bases posteriorly. There is a small nodule within the anterolateral left upper lobe measuring 4 mm, image 38 of series 7. Musculoskeletal: Degenerative disc disease identified within the thoracic spine. No aggressive lytic or sclerotic bone lesions. Review of the MIP images confirms the above findings. CTA ABDOMEN AND PELVIS FINDINGS VASCULAR Aorta: Normal caliber aorta without aneurysm, dissection, vasculitis or significant stenosis. Celiac: Patent without evidence of aneurysm, dissection, vasculitis or significant stenosis. SMA:  Patent without evidence of aneurysm, dissection, vasculitis or significant stenosis. Renals: Both renal arteries are patent without evidence of aneurysm, dissection, vasculitis, fibromuscular dysplasia or significant stenosis. IMA: Patent without evidence of aneurysm, dissection, vasculitis or significant stenosis. Inflow: Patent without evidence of aneurysm, dissection, vasculitis or significant stenosis. Veins: No obvious venous abnormality within the limitations of this arterial phase study. Review of the MIP images confirms the above findings. NON-VASCULAR Hepatobiliary: The liver has a nodular contour with relative hypertrophy of the lateral segment and left lobe of liver. The gallbladder appears normal. No biliary dilatation. Pancreas: Unremarkable. No pancreatic ductal dilatation or surrounding inflammatory changes. Spleen: Enlarged measuring 13.9 cm in length. Adrenals/Urinary Tract: The adrenal glands appear normal. The kidneys are both normal. No mass or hydronephrosis. Stone within the left posterior aspect of the urinary bladder measures 3 mm. Diverticula arises from the left posterior bladder measuring 1.9 cm. Stomach/Bowel: Normal appearance of the stomach. The small bowel loops have a normal course and caliber. No bowel obstruction. Normal appearance of the colon. Lymphatic: No adenopathy within the upper abdomen. No pelvic or inguinal adenopathy. Reproductive: Seed implants are identified within the prostate gland. Other: There is no ascites or focal fluid collections within the abdomen or pelvis. Musculoskeletal: Degenerative disc disease identified within the lumbar spine. No aggressive lytic or sclerotic bone lesions. Review of the MIP images confirms the above findings. IMPRESSION: 1. No acute findings.  No evidence for aortic dissection. 2. Aortic Atherosclerosis (ICD10-I70.0) and Emphysema (ICD10-J43.9). 3. Pulmonary nodule in the left upper lobe measures 4 mm. No follow-up needed if patient is  low-risk. Non-contrast chest CT can be considered in 12 months if patient is high-risk. This recommendation follows the consensus statement: Guidelines for Management of Incidental Pulmonary Nodules Detected on CT Images: From the Fleischner Society 2017; Radiology 2017; 284:228-243. 4. There is a small stone identified within the urinary bladder measuring 3 mm. No hydronephrosis or hydroureter identified at this time. 5. Morphologic feature the liver compatible with cirrhosis. Splenomegaly is identified. Electronically Signed   By: Kerby Moors M.D.   On: 01/03/2018 12:21       Subjective: Feels fine.  No dyspnea, leg swelling, orthopnea.  Chest pain better.  No more flank pain.    Discharge Exam: Vitals:   01/04/18 0446 01/04/18 0745  BP: 126/78 127/77  Pulse: 80 80  Resp: 20 18  Temp: 97.7 F (36.5 C) (!) 97.5 F (36.4 C)  SpO2: 94% 96%   Vitals:   01/03/18 2024 01/03/18 2048 01/04/18 0446 01/04/18 0745  BP: 137/73  126/78 127/77  Pulse: 86  80 80  Resp:  '19  20 18  ' Temp: 98.1 F (36.7 C)  97.7 F (36.5 C) (!) 97.5 F (36.4 C)  TempSrc: Oral  Oral Oral  SpO2: 94% 94% 94% 96%  Weight: 118.5 kg (261 lb 3.9 oz)     Height:        General: Pt is alert, awake, not in acute distress Cardiovascular: RRR, S1/S2 +, no rubs, no gallops Respiratory: CTA bilaterally, scattered mild wheezing, no rhonchi/rales Abdominal: Soft, NT, ND, bowel sounds + Extremities: no edema, no cyanosis    The results of significant diagnostics from this hospitalization (including imaging, microbiology, ancillary and laboratory) are listed below for reference.     Microbiology: No results found for this or any previous visit (from the past 240 hour(s)).   Labs: BNP (last 3 results) Recent Labs    01/03/18 1100  BNP 82.5   Basic Metabolic Panel: Recent Labs  Lab 01/03/18 0546 01/04/18 0436  NA 136 136  K 4.4 4.6  CL 106 102  CO2 19* 21*  GLUCOSE 180* 264*  BUN 23* 25*  CREATININE  0.98 1.08  CALCIUM 10.2 9.4   Liver Function Tests: Recent Labs  Lab 01/03/18 1100  AST 39  ALT 37  ALKPHOS 37*  BILITOT 0.8  PROT 7.8  ALBUMIN 4.1   No results for input(s): LIPASE, AMYLASE in the last 168 hours. No results for input(s): AMMONIA in the last 168 hours. CBC: Recent Labs  Lab 01/03/18 0546 01/04/18 0436  WBC 9.6 6.9  HGB 14.1 12.9*  HCT 43.3 40.2  MCV 94.3 93.9  PLT 148* 130*   Cardiac Enzymes: No results for input(s): CKTOTAL, CKMB, CKMBINDEX, TROPONINI in the last 168 hours. BNP: Invalid input(s): POCBNP CBG: Recent Labs  Lab 01/03/18 1713 01/03/18 2026 01/04/18 0741  GLUCAP 187* 282* 225*   D-Dimer No results for input(s): DDIMER in the last 72 hours. Hgb A1c Recent Labs    01/03/18 1731  HGBA1C 6.9*   Lipid Profile No results for input(s): CHOL, HDL, LDLCALC, TRIG, CHOLHDL, LDLDIRECT in the last 72 hours. Thyroid function studies No results for input(s): TSH, T4TOTAL, T3FREE, THYROIDAB in the last 72 hours.  Invalid input(s): FREET3 Anemia work up No results for input(s): VITAMINB12, FOLATE, FERRITIN, TIBC, IRON, RETICCTPCT in the last 72 hours. Urinalysis    Component Value Date/Time   COLORURINE YELLOW 01/03/2018 [place] 01/03/2018 0557   LABSPEC 1.020 01/03/2018 0557   PHURINE 5.0 01/03/2018 0557   GLUCOSEU NEGATIVE 01/03/2018 0557   HGBUR SMALL (A) 01/03/2018 0557   BILIRUBINUR NEGATIVE 01/03/2018 0557   BILIRUBINUR n 06/08/2016 1109   KETONESUR NEGATIVE 01/03/2018 0557   PROTEINUR NEGATIVE 01/03/2018 0557   UROBILINOGEN negative 06/08/2016 1109   NITRITE NEGATIVE 01/03/2018 0557   LEUKOCYTESUR TRACE (A) 01/03/2018 0557   Sepsis Labs Invalid input(s): PROCALCITONIN,  WBC,  LACTICIDVEN Microbiology No results found for this or any previous visit (from the past 240 hour(s)).   Time coordinating discharge: More than 30 minutes  SIGNED:   Sim Dada, MD  [facility] 01/04/2018,  10:07 AM

## 2018-01-04 NOTE — Progress Notes (Signed)
[name] to be D/C'd Home per MD order.  Discussed prescriptions and follow up appointments with the patient. Prescriptions given to patient, medication list explained in detail. Pt verbalized understanding.  Allergies as of 01/04/2018      Reactions   Crestor [rosuvastatin Calcium]    Lipitor [atorvastatin Calcium] Other (See Comments)   MUSCLE PAINS   Plavix [clopidogrel Bisulfate] Hives      Medication List    TAKE these medications   ASPIRIN PO Take 81 mg by mouth daily.   B-D UF III MINI PEN NEEDLES 31G X 5 MM Misc Generic drug:  Insulin Pen Needle USE AS DIRECTED EVERY DAY   blood glucose meter kit and supplies Dispense One touch ultra meter. E11.9   cholecalciferol 1000 units tablet Commonly known as:  VITAMIN D Take 2,000 Units by mouth daily.   Co Q 10 100 MG Caps Take 1 tablet by mouth daily.   ezetimibe 10 MG tablet Commonly known as:  ZETIA Take 10 mg by mouth daily.   fenofibrate 160 MG tablet Take 1 tablet (160 mg total) by mouth daily. Please keep upcoming appt  In December for future refills. Thank you   LIVALO 4 MG Tabs Generic drug:  Pitavastatin Calcium TAKE 1 TABLET BY MOUTH EVERY DAY   Magnesium 250 MG Tabs Take 200 mg by mouth.   metFORMIN 1000 MG tablet Commonly known as:  GLUCOPHAGE TAKE 1 TABLET BY MOUTH TWICE A DAY WITH A MEAL   multivitamin with minerals tablet Take 1 tablet by mouth daily.   naproxen sodium 220 MG tablet Commonly known as:  ALEVE Take 220 mg by mouth daily as needed (pain).   omega-3 acid ethyl esters 1 g capsule Commonly known as:  LOVAZA TAKE 4 CAPSULES BY MOUTH EVERY DAY   ONE TOUCH ULTRA TEST test strip Generic drug:  glucose blood USE TO TEST BLOOD SUGAR DAILY AS DIRECTED BY DOCTOR   ONETOUCH DELICA LANCETS 61Y Misc TEST ONCE DAILY AS DIRECTED   ONETOUCH DELICA LANCETS 07P Misc TEST ONCE DAILY AS DIRECTED   ramipril 5 MG capsule Commonly known as:  ALTACE Take 1 capsule (5 mg total) daily by  mouth. Please keep upcoming appt for future refills. Thank you   VICTOZA 18 MG/3ML Sopn Generic drug:  liraglutide INJECT 1.2 MG INTO THE SKIN DAILY.       Vitals:   01/04/18 0446 01/04/18 0745  BP: 126/78 127/77  Pulse: 80 80  Resp: 20 18  Temp: 97.7 F (36.5 C) (!) 97.5 F (36.4 C)  SpO2: 94% 96%    Skin clean, dry and intact without evidence of skin break down, no evidence of skin tears noted. IV catheter discontinued intact. Site without signs and symptoms of complications. Dressing and pressure applied. Pt denies pain at this time. No complaints noted.  An After Visit Summary was printed and given to the patient. Patient escorted via [place], and D/C home via private auto.  Chuck Hint RN [facility] [employer]

## 2018-01-06 ENCOUNTER — Telehealth: Payer: Self-pay | Admitting: Family Medicine

## 2018-01-06 ENCOUNTER — Telehealth (HOSPITAL_COMMUNITY): Payer: Self-pay | Admitting: *Deleted

## 2018-01-06 NOTE — Telephone Encounter (Signed)
Called pt and spoke to [place] his spouse. She states he is doing better. Current and discharge meds were reconciled. No medications were changed. Pt has an appt for hospital follow up on 01/27/2018. She will bring all medications with him. After hour and week end protocol was discussed and she verified understanding.

## 2018-01-06 NOTE — Telephone Encounter (Signed)
Patient given detailed instructions per Myocardial Perfusion Study Information Sheet for the test on 01/08/17 at 0945. Patient notified to arrive 15 minutes early and that it is imperative to arrive on time for appointment to keep from having the test rescheduled.  If you need to cancel or reschedule your appointment, please call the office within 24 hours of your appointment. . Patient verbalized understanding.Danish Ruffins, [place]

## 2018-01-08 ENCOUNTER — Ambulatory Visit (HOSPITAL_COMMUNITY): Payer: Medicare Other | Attending: Cardiovascular Disease

## 2018-01-08 DIAGNOSIS — R0609 Other forms of dyspnea: Secondary | ICD-10-CM | POA: Insufficient documentation

## 2018-01-08 DIAGNOSIS — R0600 Dyspnea, unspecified: Secondary | ICD-10-CM

## 2018-01-08 LAB — MYOCARDIAL PERFUSION IMAGING
Estimated workload: 4.6 METS
Exercise duration (min): 4 min
Exercise duration (sec): 15 s
LV dias vol: 87 mL (ref 62–150)
LV sys vol: 25 mL
MPHR: 149 {beats}/min
Peak HR: 146 {beats}/min
Percent HR: 98 %
RATE: 0.36
RPE: 18
Rest HR: 76 {beats}/min
SDS: 3
SRS: 3
SSS: 6
TID: 0.92

## 2018-01-08 MED ORDER — TECHNETIUM TC 99M TETROFOSMIN IV KIT
32.6000 | PACK | Freq: Once | INTRAVENOUS | Status: AC | PRN
  Administered 2018-01-08: 32.6 via INTRAVENOUS
  Filled 2018-01-08: qty 33

## 2018-01-08 MED ORDER — TECHNETIUM TC 99M TETROFOSMIN IV KIT
10.6000 | PACK | Freq: Once | INTRAVENOUS | Status: AC | PRN
  Administered 2018-01-08: 10.6 via INTRAVENOUS
  Filled 2018-01-08: qty 11

## 2018-01-23 ENCOUNTER — Other Ambulatory Visit: Payer: Medicare Other

## 2018-01-27 ENCOUNTER — Encounter: Payer: Self-pay | Admitting: Family Medicine

## 2018-01-27 ENCOUNTER — Ambulatory Visit: Payer: Medicare Other | Admitting: Family Medicine

## 2018-01-27 VITALS — BP 132/78 | HR 80 | Wt 270.2 lb

## 2018-01-27 DIAGNOSIS — R0902 Hypoxemia: Secondary | ICD-10-CM

## 2018-01-27 DIAGNOSIS — I2510 Atherosclerotic heart disease of native coronary artery without angina pectoris: Secondary | ICD-10-CM

## 2018-01-27 DIAGNOSIS — J449 Chronic obstructive pulmonary disease, unspecified: Secondary | ICD-10-CM | POA: Diagnosis not present

## 2018-01-27 DIAGNOSIS — N200 Calculus of kidney: Secondary | ICD-10-CM | POA: Diagnosis not present

## 2018-01-27 DIAGNOSIS — E1159 Type 2 diabetes mellitus with other circulatory complications: Secondary | ICD-10-CM | POA: Diagnosis not present

## 2018-01-27 DIAGNOSIS — I10 Essential (primary) hypertension: Secondary | ICD-10-CM | POA: Diagnosis not present

## 2018-01-27 DIAGNOSIS — I152 Hypertension secondary to endocrine disorders: Secondary | ICD-10-CM

## 2018-01-27 DIAGNOSIS — J4520 Mild intermittent asthma, uncomplicated: Secondary | ICD-10-CM | POA: Diagnosis not present

## 2018-01-27 NOTE — Progress Notes (Signed)
   Subjective:    Patient ID: Jimmy [name], male    DOB: May 17, 1946, 72 y.o.   MRN: [id number]  HPI He is here for recheck after recent hospitalization when he was found to have a renal stone but also hypoxia.  He is now having no difficulty from the stone.  He does have an underlying history of asthma as well as heart disease.  He was recently seen by cardiology and apparently had a stress test which was negative.  In the hospital he was evaluated for the hypoxia with a CT of the chest and no PE was found.  Was concerns about pulmonary involvement in this.  Presently he does not complain of chest pain or shortness of breath.   Review of Systems     Objective:   Physical Exam Alert and in no distress.  Cardiac exam shows regular rhythm without murmurs gallops.  Lungs are clear to auscultation.  PFT testing was positive.       Assessment & Plan:  Hypoxia - Plan: Spirometry with graph, Ambulatory referral to Pulmonology  Mild intermittent asthma with irreversible airway obstruction without complication ([place]) - Plan: Ambulatory referral to Pulmonology  Morbid obesity ([place])  Coronary artery disease involving native heart without angina pectoris, unspecified vessel or lesion type  Hypertension complicating diabetes ([place])  Kidney stone I will refer to pulmonology to further assess and possibly placed on medication.

## 2018-01-30 ENCOUNTER — Other Ambulatory Visit: Payer: Self-pay | Admitting: *Deleted

## 2018-01-30 DIAGNOSIS — I152 Hypertension secondary to endocrine disorders: Secondary | ICD-10-CM

## 2018-01-30 DIAGNOSIS — I2510 Atherosclerotic heart disease of native coronary artery without angina pectoris: Secondary | ICD-10-CM

## 2018-01-30 DIAGNOSIS — I10 Essential (primary) hypertension: Principal | ICD-10-CM

## 2018-01-30 DIAGNOSIS — E1159 Type 2 diabetes mellitus with other circulatory complications: Secondary | ICD-10-CM

## 2018-01-30 LAB — HM DIABETES EYE EXAM

## 2018-01-30 MED ORDER — RAMIPRIL 5 MG PO CAPS: 5.0000 mg | ORAL_CAPSULE | Freq: Every day | ORAL | 2 refills | Status: DC

## 2018-02-16 ENCOUNTER — Other Ambulatory Visit: Payer: Self-pay | Admitting: Family Medicine

## 2018-02-18 ENCOUNTER — Encounter: Payer: Self-pay | Admitting: Internal Medicine

## 2018-02-18 ENCOUNTER — Ambulatory Visit: Payer: Medicare Other | Admitting: Internal Medicine

## 2018-02-18 VITALS — BP 134/78 | HR 98 | Ht 67.5 in | Wt 268.2 lb

## 2018-02-18 DIAGNOSIS — R0600 Dyspnea, unspecified: Secondary | ICD-10-CM

## 2018-02-18 DIAGNOSIS — I10 Essential (primary) hypertension: Secondary | ICD-10-CM | POA: Diagnosis not present

## 2018-02-18 DIAGNOSIS — R0609 Other forms of dyspnea: Secondary | ICD-10-CM | POA: Diagnosis not present

## 2018-02-18 MED ORDER — IRBESARTAN 75 MG PO TABS: 75.0000 mg | ORAL_TABLET | Freq: Every day | ORAL | 11 refills | Status: DC

## 2018-02-18 NOTE — Patient Instructions (Addendum)
Stop ramapril - Altace  avapro (ibesartan)  75  mg one daily  - call me for adjustments if blood pressure not where you want it     Please schedule a follow up office visit in 6 weeks, call sooner if needed with PFTs on return

## 2018-02-18 NOTE — Assessment & Plan Note (Addendum)
Echo 01/08/31 GI  diastolic dysfunction with mild LAE  - myoview  01/08/18 ok - Spirometry  01/27/18  Flattened exp f/v loop on acei > d/c'd  02/18/2018    Symptoms are markedly disproportionate to objective findings and not clear this is actually much of a  lung problem but pt does appear to have difficult to sort out respiratory symptoms of unknown origin for which  DDX  = almost all start with A and  include Adherence, Ace Inhibitors, Acid Reflux, Active Sinus Disease, Alpha 1 Antitripsin deficiency, Anxiety masquerading as Airways dz,  ABPA,  Allergy(esp in young), Aspiration (esp in elderly), Adverse effects of meds,  Active smokers, A bunch of PE's (a small clot burden can't cause this syndrome unless there is already severe underlying pulm or vascular dz with poor reserve) plus two Bs  = Bronchiectasis and Beta blocker use..and one C= CHF     Adherence is always the initial "prime suspect" and is a multilayered concern that requires a "trust but verify" approach in every patient - starting with knowing how to use medications, especially inhalers, correctly, keeping up with refills and understanding the fundamental difference between maintenance and prns vs those medications only taken for a very short course and then stopped and not refilled.   ACEi adverse effects at the  top of the usual list of suspects and the only way to rule it out is a trial off > see a/p     ? Allergy / asthma > no noct symptoms so less likely > full pfts when returns in 6 weeks   ? Acid reflux > consider rx on return if not better  ? Active sinus dz > consider sinus CT next ov is not better  ? Adverse effects of meds > none listed x for acei and oil based vitamins, the latter would have a low threshold to try off   ? Anxiety > usually at the bottom of this list of usual suspects but more likely given the sob is more now at rest than with exertion    ? CHF > see above echo with diastolic dysfunction and mild MR with  edema on exam and a few crackles in bases > rx per cards/ pcp   Total time devoted to counseling  > 50 % of initial 60 min office visit:  review case with pt/wife discussion of options/alternatives/ personally creating written customized instructions  in presence of pt  then going over those specific  Instructions directly with the pt including how to use all of the meds but in particular covering each new medication in detail and the difference between the maintenance= "automatic" meds and the prns using an action plan format for the latter (If this problem/symptom => do that organization reading Left to right).  Please see AVS from this visit for a full list of these instructions which I personally wrote for this pt and  are unique to this visit.

## 2018-02-18 NOTE — Assessment & Plan Note (Signed)
In the best review of chronic cough to date ( NEJM 2016 375 [phone]) ,  ACEi are now felt to cause cough in up to  20% of pts which is a 4 fold increase from previous reports and does not include the variety of non-specific complaints we see in pulmonary clinic in pts on ACEi but previously attributed to another dx like  Copd/asthma and  include PNDS, throat and chest congestion, "bronchitis", unexplained dyspnea and noct "strangling" sensations, and hoarseness, but also  atypical /refractory GERD symptoms like dysphagia and "bad heartburn"   The only way I know  to prove this is not an "ACEi Case" is a trial off ACEi x a minimum of 6 weeks then regroup.   Try avapro 75 mg daily and pfts in 6 weeks

## 2018-02-18 NOTE — Progress Notes (Signed)
Subjective:     Patient ID: Jimmy [name], male   DOB: 1946/08/28,    MRN: [id number]  HPI  38 yowm quit smoking 04/1999 s respiratory problems   Wt low 200s then "allergies" 2016  sinus problems severe with multiple ov's to sinus / ent eval but then fall 2018 new breathing problem comes and goes variably sob mb and back last time was Jan 2018 then sob at rest and resolves on its own and ok hs so referred to pulmonary clinic 02/18/2018 by Dr   Redmond School   02/18/2018 1st [facility] office visit/ W[facility]   Chief Complaint  Patient presents with  . Pulmonary Consult    Referred by Dr. Redmond School. He states having DOE and non prod cough x 3 months. He gets SOB walking to the mailbox and back.    able to do recumbent bike fine When breathing is bad tends to cough  Not using cpap x year and sleeping fine  No sob x one month mb and back  But sob occ at rest like choking    No obvious day to day or daytime variability or assoc excess/ purulent sputum or mucus plugs or hemoptysis or cp or chest tightness, subjective wheeze or overt sinus or hb symptoms. No unusual exposure hx or h/o childhood pna/ asthma or knowledge of premature birth.  Sleeping ok on side without nocturnal  or early am exacerbation  of respiratory  c/o's or need for noct saba. Also denies any obvious fluctuation of symptoms with weather or environmental changes or other aggravating or alleviating factors except as outlined above   Current Allergies, Complete Past Medical History, Past Surgical History, Family History, and Social History were reviewed in [employer] record.  ROS  The following are not active complaints unless bolded Hoarseness, sore throat, dysphagia, dental problems, itching, sneezing,  nasal congestion or discharge of excess mucus or purulent secretions, ear ache,   fever, chills, sweats, unintended wt loss or wt gain, classically pleuritic or exertional cp,  orthopnea pnd or leg swelling,  presyncope, palpitations, abdominal pain, anorexia, nausea, vomiting, diarrhea  or change in bowel habits or change in bladder habits, change in stools or change in urine, dysuria, hematuria,  rash, arthralgias, visual complaints, headache, numbness, weakness or ataxia or problems with walking or coordination,  change in mood/affect or memory.        Current Meds  Medication Sig  . ASPIRIN PO Take 81 mg by mouth daily.   . B-D UF III MINI PEN NEEDLES 31G X 5 MM MISC USE AS DIRECTED EVERY DAY  . blood glucose meter kit and supplies Dispense One touch ultra meter. E11.9  . cholecalciferol (VITAMIN D) 1000 UNITS tablet Take 2,000 Units by mouth daily.   [name] ezetimibe (ZETIA) 10 MG tablet Take 10 mg by mouth daily.  . fenofibrate 160 MG tablet Take 1 tablet (160 mg total) by mouth daily. Please keep upcoming appt  In December for future refills. Thank you  . LIVALO 4 MG TABS TAKE 1 TABLET BY MOUTH EVERY DAY  . Magnesium 250 MG TABS Take 200 mg by mouth.   . metFORMIN (GLUCOPHAGE) 1000 MG tablet TAKE 1 TABLET BY MOUTH TWICE A DAY WITH A MEAL  . Multiple Vitamins-Minerals (MULTIVITAMIN WITH MINERALS) tablet Take 1 tablet by mouth daily.    . naproxen sodium (ALEVE) 220 MG tablet Take 220 mg by mouth daily as needed (pain).  [name] omega-3 acid ethyl esters (LOVAZA) 1 g  capsule TAKE 4 CAPSULES BY MOUTH EVERY DAY  . ONE TOUCH ULTRA TEST test strip USE TO TEST BLOOD SUGAR DAILY AS DIRECTED BY DOCTOR  . ONETOUCH DELICA LANCETS 47B MISC TEST ONCE DAILY AS DIRECTED  . ONETOUCH DELICA LANCETS 40Z MISC TEST ONCE DAILY AS DIRECTED  . VICTOZA 18 MG/3ML SOPN INJECT 1.2 MG INTO THE SKIN DAILY.  . [ ramipril (ALTACE) 5 MG capsule Take 1 capsule (5 mg total) by mouth daily.          Review of Systems     Objective:   Physical Exam   Obese wm nad   Wt Readings from Last 3 Encounters:  02/18/18 268 lb 3.2 oz (121.7 kg)  01/27/18 270 lb 3.2 oz (122.6 kg)  01/08/18 262 lb (118.8 kg)     Vital signs  reviewed - Note on arrival 02 sats  95% on RA     HEENT: nl dentition, turbinates bilaterally, and oropharynx. Nl external ear canals without cough reflex   NECK :  without JVD/Nodes/TM/ nl carotid upstrokes bilaterally   LUNGS: no acc muscle use,  Nl contour chest with a few crackles in R > L base on inspiration   CV:  RRR  no s3 or murmur or increase in P2, and  Trace ankle edema sym bilaterally  ABD:  soft and nontender with nl inspiratory excursion in the supine position. No bruits or organomegaly appreciated, bowel sounds nl  MS:  Nl gait/ ext warm without deformities, calf tenderness, cyanosis or clubbing No obvious joint restrictions   SKIN: warm and dry without lesions    NEURO:  alert, approp, nl sensorium with  no motor or cerebellar deficits apparent.      I personally reviewed images and agree with radiology impression as follows:   Chest CT  01/03/18 1. No acute findings.  No evidence for aortic dissection. 2. Aortic Atherosclerosis (ICD10-I70.0) and Emphysema (ICD10-J43.9). 3. Pulmonary nodule in the left upper lobe measures 4 mm. No follow-up needed if patient is low-risk. Non-contrast chest CT can be considered in 12 months if patient is high-risk    Labs ordered/ reviewed:      Chemistry      Component Value Date/Time   NA 136 01/04/2018 0436   NA 142 12/12/2017 1536   K 4.6 01/04/2018 0436   CL 102 01/04/2018 0436   CO2 21 (L) 01/04/2018 0436   BUN 25 (H) 01/04/2018 0436   BUN 22 12/12/2017 1536   CREATININE 1.08 01/04/2018 0436   CREATININE 0.89 11/19/2016 0920      Component Value Date/Time   CALCIUM 9.4 01/04/2018 0436   ALKPHOS 37 (L) 01/03/2018 1100   AST 39 01/03/2018 1100   ALT 37 01/03/2018 1100   BILITOT 0.8 01/03/2018 1100   BILITOT 0.3 03/20/2017 0801        Lab Results  Component Value Date   WBC 6.9 01/04/2018   HGB 12.9 (L) 01/04/2018   HCT 40.2 01/04/2018   MCV 93.9 01/04/2018   PLT 130 (L) 01/04/2018                Assessment:

## 2018-02-18 NOTE — Assessment & Plan Note (Signed)
Body mass index is 41.39 kg/m.  -    No results found for: TSH   Contributing to gerd risk/ doe/reviewed the need and the process to achieve and maintain neg calorie balance > defer f/u primary care including intermittently monitoring thyroid status

## 2018-02-20 ENCOUNTER — Other Ambulatory Visit: Payer: Self-pay | Admitting: Internal Medicine

## 2018-02-20 DIAGNOSIS — E785 Hyperlipidemia, unspecified: Secondary | ICD-10-CM

## 2018-02-20 DIAGNOSIS — I2510 Atherosclerotic heart disease of native coronary artery without angina pectoris: Secondary | ICD-10-CM

## 2018-02-20 NOTE — Telephone Encounter (Signed)
Refill Request.  

## 2018-03-18 ENCOUNTER — Other Ambulatory Visit: Payer: Self-pay | Admitting: Family Medicine

## 2018-04-01 ENCOUNTER — Ambulatory Visit: Payer: Medicare Other | Admitting: Internal Medicine

## 2018-04-02 ENCOUNTER — Encounter: Payer: Self-pay | Admitting: Internal Medicine

## 2018-04-02 ENCOUNTER — Ambulatory Visit: Payer: Medicare Other | Admitting: Internal Medicine

## 2018-04-02 ENCOUNTER — Ambulatory Visit (INDEPENDENT_AMBULATORY_CARE_PROVIDER_SITE_OTHER): Payer: Medicare Other | Admitting: Internal Medicine

## 2018-04-02 VITALS — BP 120/66 | HR 100 | Ht 67.5 in | Wt 269.0 lb

## 2018-04-02 DIAGNOSIS — R0609 Other forms of dyspnea: Secondary | ICD-10-CM

## 2018-04-02 DIAGNOSIS — I10 Essential (primary) hypertension: Secondary | ICD-10-CM | POA: Diagnosis not present

## 2018-04-02 DIAGNOSIS — R0600 Dyspnea, unspecified: Secondary | ICD-10-CM

## 2018-04-02 DIAGNOSIS — R911 Solitary pulmonary nodule: Secondary | ICD-10-CM | POA: Diagnosis not present

## 2018-04-02 LAB — PULMONARY FUNCTION TEST
DL/VA % pred: 87 %
DL/VA: 3.89 ml/min/mmHg/L
DLCO unc % pred: 65 %
DLCO unc: 19.1 ml/min/mmHg
FEF 25-75 Post: 2.47 L/sec
FEF 25-75 Pre: 2.52 L/sec
FEF2575-%Change-Post: -2 %
FEF2575-%Pred-Post: 114 %
FEF2575-%Pred-Pre: 117 %
FEV1-%Change-Post: 0 %
FEV1-%Pred-Post: 91 %
FEV1-%Pred-Pre: 91 %
FEV1-Post: 2.64 L
FEV1-Pre: 2.63 L
FEV1FVC-%Change-Post: 0 %
FEV1FVC-%Pred-Pre: 108 %
FEV6-%Change-Post: 0 %
FEV6-%Pred-Post: 89 %
FEV6-%Pred-Pre: 89 %
FEV6-Post: 3.33 L
FEV6-Pre: 3.31 L
FEV6FVC-%Change-Post: 0 %
FEV6FVC-%Pred-Post: 105 %
FEV6FVC-%Pred-Pre: 105 %
FVC-%Change-Post: 0 %
FVC-%Pred-Post: 85 %
FVC-%Pred-Pre: 84 %
FVC-Post: 3.35 L
FVC-Pre: 3.32 L
Post FEV1/FVC ratio: 79 %
Post FEV6/FVC ratio: 99 %
Pre FEV1/FVC ratio: 79 %
Pre FEV6/FVC Ratio: 100 %
RV % pred: 87 %
RV: 2.07 L
TLC % pred: 80 %
TLC: 5.26 L

## 2018-04-02 NOTE — Progress Notes (Signed)
PFT done today. 

## 2018-04-02 NOTE — Progress Notes (Signed)
Subjective:     Patient ID: Jimmy [name], male   DOB: 06/30/1946,    MRN: [id number]    Brief patient profile:  71 yowm quit smoking 04/1999 s respiratory problems   Wt low 200s then "allergies" 2016  sinus problems severe with multiple ov's to sinus / ent eval but then fall 2018 new breathing problem comes and goes variably sob mb and back  Jan 2018 onset paroxysms of  sob at rest and resolves on its own and ok hs so referred to pulmonary clinic 02/18/2018 by Dr   [name] with nl pfts 04/02/2018 and resolution of all symptoms off ACEi   History of Present Illness  02/18/2018 1st [facility] office visit/    Chief Complaint  Patient presents with  . Pulmonary Consult    Referred by Dr. [name]. He states having DOE and non prod cough x 3 months. He gets SOB walking to the mailbox and back.    able to do recumbent bike fine When breathing is bad tends to cough  Not using cpap x year and sleeping fine  No sob x one month mb and back  But sob occ at rest - sometimes feels  like choking  rec Stop ramapril - Altace avapro (ibesartan)  75  mg one daily  - call me for adjustments if blood pressure not where you want it     04/02/2018  f/u ov/ re: f/u sob off acei x 6 weeks  Chief Complaint  Patient presents with  . Follow-up    PFT's done today. Cough has resolved. His breathing has improved some, but not back to his normal baseline yet.    Dyspnea:  Only time is like when crosses parking lot  Walked around [employer] for up to two hours one day prior to OV   Cough: none Sleep: ok/  SABA use: none   No obvious day to day or daytime variability or assoc excess/ purulent sputum or mucus plugs or hemoptysis or cp or chest tightness, subjective wheeze or overt sinus or hb symptoms. No unusual exposure hx or h/o childhood pna/ asthma or knowledge of premature birth.  Sleeping ok flat without nocturnal  or early am exacerbation  of respiratory  c/o's or need for noct saba. Also denies  any obvious fluctuation of symptoms with weather or environmental changes or other aggravating or alleviating factors except as outlined above   Current Allergies, Complete Past Medical History, Past Surgical History, Family History, and Social History were reviewed in [facility] electronic medical record.  ROS  The following are not active complaints unless bolded Hoarseness, sore throat, dysphagia, dental problems, itching, sneezing,  nasal congestion or discharge of excess mucus or purulent secretions, ear ache,   fever, chills, sweats, unintended wt loss or wt gain, classically pleuritic or exertional cp,  orthopnea pnd or leg swelling, presyncope, palpitations, abdominal pain, anorexia, nausea, vomiting, diarrhea  or change in bowel habits or change in bladder habits, change in stools or change in urine, dysuria, hematuria,  rash, arthralgias, visual complaints, headache, numbness, weakness or ataxia or problems with walking or coordination,  change in mood/affect or memory.        Current Meds  Medication Sig  . ASPIRIN PO Take 81 mg by mouth daily.   . B-D UF III MINI PEN NEEDLES 31G X 5 MM MISC USE TO INJECT INSULIN EVERY DAY  . blood glucose meter kit and supplies Dispense One touch ultra meter. E11.9  . cholecalciferol (  VITAMIN D) 1000 UNITS tablet Take 2,000 Units by mouth daily.   . ezetimibe (ZETIA) 10 MG tablet Take 10 mg by mouth daily.  . fenofibrate 160 MG tablet Take 1 tablet (160 mg total) by mouth daily.  . irbesartan (AVAPRO) 75 MG tablet Take 1 tablet (75 mg total) by mouth daily.  . LIVALO 4 MG TABS TAKE 1 TABLET BY MOUTH EVERY DAY  . Magnesium 250 MG TABS Take 200 mg by mouth.   . metFORMIN (GLUCOPHAGE) 1000 MG tablet TAKE 1 TABLET BY MOUTH TWICE A DAY WITH A MEAL  . Multiple Vitamins-Minerals (MULTIVITAMIN WITH MINERALS) tablet Take 1 tablet by mouth daily.    . naproxen sodium (ALEVE) 220 MG tablet Take 220 mg by mouth daily as needed (pain).  . omega-3 acid ethyl  esters (LOVAZA) 1 g capsule TAKE 4 CAPSULES BY MOUTH EVERY DAY  . ONE TOUCH ULTRA TEST test strip USE TO TEST BLOOD SUGAR DAILY AS DIRECTED BY DOCTOR  . ONETOUCH DELICA LANCETS 33G MISC TEST ONCE DAILY AS DIRECTED  . VICTOZA 18 MG/3ML SOPN INJECT 1.2 MG INTO THE SKIN DAILY.          Objective:   Physical Exam    obese amb wm all smiles     04/02/2018        269   02/18/18 268 lb 3.2 oz (121.7 kg)  01/27/18 270 lb 3.2 oz (122.6 kg)  01/08/18 262 lb (118.8 kg)      Vital signs reviewed - Note on arrival 02 sats  94% on RA         HEENT: nl dentition, turbinates bilaterally, and oropharynx. Nl external ear canals without cough reflex   NECK :  without JVD/Nodes/TM/ nl carotid upstrokes bilaterally   LUNGS: no acc muscle use,  Nl contour chest which is clear to A and P bilaterally without cough on insp or exp maneuvers   CV:  RRR  no s3 or murmur or increase in P2, and no edema   ABD:  Obese soft and nontender with nl inspiratory excursion in the supine position. No bruits or organomegaly appreciated, bowel sounds nl  MS:  Nl gait/ ext warm without deformities, calf tenderness, cyanosis or clubbing No obvious joint restrictions   SKIN: warm and dry without lesions    NEURO:  alert, approp, nl sensorium with  no motor or cerebellar deficits apparent.                      Assessment:           

## 2018-04-02 NOTE — Patient Instructions (Signed)
Weight control is simply a matter of calorie balance which needs to be tilted in your favor by eating less and exercising more.  To get the most out of exercise, you need to be continuously aware that you are short of breath, but never out of breath, for 30 minutes daily. As you improve, it will actually be easier for you to do the same amount of exercise  in  30 minutes so always push to the level where you are short of breath.  If this does not result in gradual weight reduction then I strongly recommend you see a nutritionist with a food diary x 2 weeks so that we can work out a negative calorie balance which is universally effective in steady weight loss programs.  Think of your calorie balance like you do your bank account where in this case you want the balance to go down so you must take in less calories than you burn up.  It's just that simple:  Hard to do, but easy to understand.  Good luck!    We will contact you in January re: repeat CT     If you are satisfied with your treatment plan,  let your doctor know and he/she can either refill your medications or you can return here when your prescription runs out.     If in any way you are not 100% satisfied,  please tell us.  If 100% better, tell your friends!  Pulmonary follow up is as needed

## 2018-04-02 NOTE — Assessment & Plan Note (Signed)
Adequate control on present rx, reviewed in detail with pt > no change in rx needed    Although even in retrospect it may not be clear the ACEi contributed to the pt's symptoms,  Pt improved off them and adding them back at this point or in the future would risk confusion in interpretation of non-specific respiratory symptoms to which this patient is prone  ie  Better not to muddy the waters here.   F/u per pcp

## 2018-04-02 NOTE — Assessment & Plan Note (Signed)
Body mass index is 41.51 kg/m.  -  Trending up slightly  No results found for: TSH   Contributing to gerd risk/ doe/reviewed the need and the process to achieve and maintain neg calorie balance > defer f/u primary care including intermittently monitoring thyroid status     I had an extended summary final discussion with the patient and wifereviewing all relevant studies completed to date and  lasting 15 to 20 minutes of a 25 minute visit    Each maintenance medication was reviewed in detail including most importantly the difference between maintenance and prns and under what circumstances the prns are to be triggered using an action plan format that is not reflected in the computer generated alphabetically organized AVS.    Please see AVS for specific instructions unique to this visit that I personally wrote and verbalized to the the pt in detail and then reviewed with pt  by my nurse highlighting any  changes in therapy recommended at today's visit to their plan of care.

## 2018-04-02 NOTE — Assessment & Plan Note (Signed)
Echo 46/43/14 GI  diastolic dysfunction with mild LAE  - myoview  01/08/18 ok - Spirometry  01/27/18  Flattened exp f/v loop on acei > d/c'd  02/18/2018  > resolved 04/02/2018  - PFT's  04/02/2018   Nl x ERV 34% c/w obesity   Better to his satisfaction off acei  > see sep a/p  Only abn is related to MO > see sp a/p

## 2018-04-02 NOTE — Assessment & Plan Note (Signed)
CT 01/03/18 x 4 mm > reminder for 01/03/19   CT results reviewed with pt >>> Too small for PET or bx, not suspicious enough for excisional bx > really only option for now is follow the Fleischner society guidelines as rec by radiology = year f/u due as above  Discussed in detail all the  indications, usual  risks and alternatives  relative to the benefits with patient who agrees to proceed with conservative f/u as outlined

## 2018-04-03 ENCOUNTER — Encounter: Payer: Self-pay | Admitting: Family Medicine

## 2018-04-03 ENCOUNTER — Ambulatory Visit: Payer: Medicare Other | Admitting: Family Medicine

## 2018-04-03 VITALS — BP 126/74 | HR 87 | Wt 271.8 lb

## 2018-04-03 DIAGNOSIS — E118 Type 2 diabetes mellitus with unspecified complications: Secondary | ICD-10-CM

## 2018-04-03 DIAGNOSIS — E1169 Type 2 diabetes mellitus with other specified complication: Secondary | ICD-10-CM | POA: Diagnosis not present

## 2018-04-03 DIAGNOSIS — E1159 Type 2 diabetes mellitus with other circulatory complications: Secondary | ICD-10-CM

## 2018-04-03 DIAGNOSIS — I152 Hypertension secondary to endocrine disorders: Secondary | ICD-10-CM

## 2018-04-03 DIAGNOSIS — I10 Essential (primary) hypertension: Secondary | ICD-10-CM | POA: Diagnosis not present

## 2018-04-03 DIAGNOSIS — E785 Hyperlipidemia, unspecified: Secondary | ICD-10-CM

## 2018-04-03 LAB — POCT GLYCOSYLATED HEMOGLOBIN (HGB A1C): Hemoglobin A1C: 7.6

## 2018-04-03 NOTE — Progress Notes (Signed)
  Subjective:    Patient ID: Jimmy [name], male    DOB: 07/11/1946, 72 y.o.   MRN: [id number]  Jimmy [name] is a 72 y.o. male who presents for follow-up of Type 2 diabetes mellitus.  Patient is checking home blood sugars.   Home blood sugar records: meter records states his blood sugars are in the 130 range. How often is blood sugars being checked: 1x a day Current symptoms/problems include none and have been unchanged. Daily foot checks: yes   Any foot concerns: no Last eye exam: dec. 2018 Exercise: bike or 20-30- min about 3-4 days a week.  He continues to see Tonie Griffith for nutrition.  He was seen recently by pulmonary and switched from Altace to Avapro.  He continues on the Livalo and Lovaza as well as fenofibrate and is having no difficulty with that.  He is also taking metformin and Victoza.  The following portions of the patient's history were reviewed and updated as appropriate: allergies, current medications, past medical history, past social history and problem list.  ROS as in subjective above.     Objective:    Physical Exam Alert and in no distress otherwise not examined.   Lab Review Diabetic Labs Latest Ref Rng & Units 01/04/2018 01/03/2018 12/12/2017 12/02/2017 07/29/2017  HbA1c 4.8 - 5.6 % - 6.9(H) - 6.7 6.7  Microalbumin mg/L - - - - 16.2  Micro/Creat Ratio - - - - - 7.1  Chol 100 - 199 mg/dL - - - - -  HDL >39 mg/dL - - - - -  Calc LDL 0 - 99 mg/dL - - - - -  Triglycerides 0 - 149 mg/dL - - - - -  Creatinine 0.61 - 1.24 mg/dL 1.08 0.98 1.03 - -   BP/Weight 04/02/2018 02/18/2018 01/27/2018 4/0/0867 05/31/9508  Systolic BP 326 712 458 099 -  Diastolic BP 66 78 78 77 -  Wt. (Lbs) 269 268.2 270.2 - 261.25  BMI 41.51 41.39 42.32 - 39.72   Foot/eye exam completion dates Latest Ref Rng & Units 01/30/2018 11/01/2016  Eye Exam No Retinopathy No Retinopathy No Retinopathy  Foot Form Completion - - -  A1c is 7.6  Jimmy [name]  reports that he quit smoking about 18 years  ago. His smoking use included cigarettes. He has a 40.00 pack-year smoking history. He has never used smokeless tobacco. He reports that he drinks about 0.6 oz of alcohol per week. He reports that he does not use drugs.     Assessment & Plan:    Type 2 diabetes mellitus with complication, unspecified whether long term insulin use (HCC) - Plan: POCT glycosylated hemoglobin (Hb A1C)  Hyperlipidemia associated with type 2 diabetes mellitus ([place])  Hypertension associated with type 2 diabetes mellitus ([place])  Morbid obesity due to excess calories ([place])    1. Rx changes: none 2. Education: Reviewed 'ABCs' of diabetes management (respective goals in parentheses):  A1C (<7), blood pressure (<130/80), and cholesterol (LDL <100). 3. Compliance at present is estimated to be fair. Efforts to improve compliance (if necessary) will be directed at diet change.  I also encouraged him to do something physical on a daily basis.  Recommend walking at least 20 minutes every day.  Encouraged him also to continue to go to the nutritionist to help with making further dietary changes.  He states that he does have a goal of getting his waist down to under 40. 4. Follow up: 4 months

## 2018-05-16 ENCOUNTER — Encounter: Payer: Self-pay | Admitting: Family Medicine

## 2018-05-16 ENCOUNTER — Ambulatory Visit: Payer: Medicare Other | Admitting: Family Medicine

## 2018-05-16 VITALS — BP 140/72 | HR 91 | Temp 97.4°F | Ht 67.5 in | Wt 273.0 lb

## 2018-05-16 DIAGNOSIS — S90414A Abrasion, right lesser toe(s), initial encounter: Secondary | ICD-10-CM

## 2018-05-16 NOTE — Progress Notes (Signed)
   Subjective:    Patient ID: Jimmy [name], male    DOB: October 09, 1946, 72 y.o.   MRN: [id number]  HPI He injured his right second toe at the base of the nail on the bottom of the door approximately 1 week ago.  He is here to have that reevaluated.   Review of Systems     Objective:   Physical Exam Exam of the right second toe shows no damage to the nail and an abrasion just proximal to that but it is not hot red or tender.       Assessment & Plan:  Abrasion of toe of right foot, initial encounter At the present time there is no evidence of infection.  He did take a picture of this.  I instructed him to keep the area covered and if he notes any worsening of his symptoms to let me know.  Explained that after a week I doubt that that would occur.

## 2018-05-20 ENCOUNTER — Other Ambulatory Visit: Payer: Self-pay | Admitting: Cardiology

## 2018-05-20 ENCOUNTER — Other Ambulatory Visit: Payer: Self-pay | Admitting: Family Medicine

## 2018-05-20 DIAGNOSIS — I2510 Atherosclerotic heart disease of native coronary artery without angina pectoris: Secondary | ICD-10-CM

## 2018-05-20 DIAGNOSIS — E785 Hyperlipidemia, unspecified: Secondary | ICD-10-CM

## 2018-06-19 ENCOUNTER — Ambulatory Visit: Payer: Medicare Other | Admitting: Internal Medicine

## 2018-06-19 ENCOUNTER — Encounter: Payer: Self-pay | Admitting: Internal Medicine

## 2018-06-19 ENCOUNTER — Telehealth: Payer: Self-pay | Admitting: Internal Medicine

## 2018-06-19 VITALS — BP 140/72 | HR 85 | Ht 67.0 in | Wt 267.0 lb

## 2018-06-19 DIAGNOSIS — E782 Mixed hyperlipidemia: Secondary | ICD-10-CM | POA: Insufficient documentation

## 2018-06-19 DIAGNOSIS — I10 Essential (primary) hypertension: Secondary | ICD-10-CM

## 2018-06-19 DIAGNOSIS — I2510 Atherosclerotic heart disease of native coronary artery without angina pectoris: Secondary | ICD-10-CM

## 2018-06-19 DIAGNOSIS — R0602 Shortness of breath: Secondary | ICD-10-CM | POA: Diagnosis not present

## 2018-06-19 MED ORDER — IRBESARTAN 150 MG PO TABS: 150.0000 mg | ORAL_TABLET | Freq: Every day | ORAL | 3 refills | Status: DC

## 2018-06-19 NOTE — Patient Instructions (Addendum)
Medication Instructions: INCREASE Irbesartan to 150 mg daily    -- If you need a refill on your cardiac medications before your next appointment, please call your pharmacy. --  Labwork:  2 WEEKS BMET FASTING LIPID PROFILE  Testing/Procedures: None ordered  Follow-Up: Your physician wants you to follow-up in: 6 MONTHS with Dr. Saunders Revel.    Thank you for choosing [facility]!!    Any Other Special Instructions Will Be Listed Below (If Applicable).  PLEASE SCHEDULE A BP CHECK WITH THE [facility] CLINIC OR NURSE VISIT AND HAVE THE LABS DRAWN SAME DAY.[name]  [place]

## 2018-06-19 NOTE — Telephone Encounter (Signed)
Spoke with the patient's wife and recapped the OV and recommendations per Dr End.  Will have checkout inform them of lab date.

## 2018-06-19 NOTE — Progress Notes (Signed)
 Follow-up Outpatient Visit Date: 06/19/2018  Primary Care Provider: [name], John C, MD [address] [place] [address]  Chief Complaint: Follow-up coronary artery disease  HPI:  Jimmy [name] is a 72 y.o. year-old male with history of non-obstructive coronary artery disease and possible RCA dissection ([facility] 2000), hypertension hyperlipidemia, diabetes mellitus, obesity, and OSA, who presents for follow-up of CAD.  I last saw him in December, which time he reported progressive exertional dyspnea over 6 months.  We subsequently obtained echocardiogram and myocardial perfusion stress test, neither of which showed any significant abnormalities.  He was subsequently evaluated by Dr. Wert (pulmonary) and was switched from ramipril to irbesartan.  Since then, [name] has noted resolution of his shortness of breath.  He denies further episodes of chest pain, as well as palpitations, lightheadedness, orthopnea, PND, and edema.  Home blood pressures are typically mildly elevated, with systolic readings up to 150 mmHg.  --------------------------------------------------------------------------------------------------  Past Medical History:  Diagnosis Date  . Astigmatism of both eyes 11/01/2016  . Bronchitis   . Cortical age-related cataract of both eyes 11/01/2016  . Drug-induced osteoporosis   . Dyslipidemia   . Fatigue   . Fatty liver   . Hypercholesteremia   . Hypogonadism male   . 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 Normal nuclear stress test March 2011  . Nuclear sclerotic cataract of both eyes 11/01/2016  . Obesity   . Prostate cancer (HCC)    s/p seed implant and radiation  . Sleep apnea   . Vitamin D insufficiency    Past Surgical History:  Procedure Laterality Date  . CARDIAC CATHETERIZATION  April 2000  . RADIOACTIVE SEED IMPLANT  2009   Implanted radiation seeds     Current Meds  Medication Sig  . ASPIRIN PO Take 81 mg by mouth daily.   . B-D UF III MINI PEN NEEDLES 31G X 5 MM MISC USE TO INJECT INSULIN EVERY DAY  . blood glucose meter kit and supplies Dispense One touch ultra meter. E11.9  . cholecalciferol (VITAMIN D) 1000 UNITS tablet Take 2,000 Units by mouth daily.   . ezetimibe (ZETIA) 10 MG tablet TAKE 1 TABLET BY MOUTH EVERY DAY  . fenofibrate 160 MG tablet Take 1 tablet (160 mg total) by mouth daily.  . irbesartan (AVAPRO) 75 MG tablet Take 1 tablet (75 mg total) by mouth daily.  . LIVALO 4 MG TABS TAKE 1 TABLET BY MOUTH EVERY DAY  . Magnesium 250 MG TABS Take 200 mg by mouth.   . metFORMIN (GLUCOPHAGE) 1000 MG tablet TAKE 1 TABLET BY MOUTH TWICE A DAY WITH A MEAL  . Multiple Vitamins-Minerals (MULTIVITAMIN WITH MINERALS) tablet Take 1 tablet by mouth daily.    . naproxen sodium (ALEVE) 220 MG tablet Take 220 mg by mouth daily as needed (pain).  . omega-3 acid ethyl esters (LOVAZA) 1 g capsule TAKE 4 CAPSULES BY MOUTH EVERY DAY  . ONE TOUCH ULTRA TEST test strip USE TO TEST BLOOD SUGAR DAILY AS DIRECTED BY DOCTOR  . ONETOUCH DELICA LANCETS 33G MISC TEST ONCE DAILY AS DIRECTED  . VICTOZA 18 MG/3ML SOPN INJECT 1.2 MG INTO THE SKIN DAILY.    Allergies: Ace inhibitors; Cephalexin; Crestor [rosuvastatin calcium]; Lipitor [atorvastatin calcium]; and Plavix [clopidogrel bisulfate]  Social History   Tobacco Use  . Smoking status: Former Smoker    Packs/day: 1.00      Years: 40.00    Pack years: 40.00    Types: Cigarettes    Last attempt to quit: 08/20/1999    Years since quitting: 18.8  . Smokeless tobacco: Never Used  Substance Use Topics  . Alcohol use: Yes    Alcohol/week: 0.6 oz    Types: 1 Shots of liquor per week  . Drug use: No    Family History  Problem Relation Age of Onset  . COPD Father     Review of Systems: A 12-system review of systems was performed and was negative except as noted in the  HPI.  --------------------------------------------------------------------------------------------------  Physical Exam: BP 140/72   Pulse 85   Ht 5' 7" (1.702 m)   Wt 267 lb (121.1 kg)   SpO2 93%   BMI 41.82 kg/m   General:  Morbidly obese man, seated comfortably in the exam room. HEENT: No conjunctival pallor or scleral icterus. Moist mucous membranes.  OP clear. Neck: Supple without lymphadenopathy, thyromegaly, JVD, or HJR. Lungs: Normal work of breathing. Clear to auscultation bilaterally without wheezes or crackles. Heart: Regular rate and rhythm without murmurs, rubs, or gallops. Unable to assess PMI due to body habitus. Abd: Bowel sounds present. Soft, NT/ND.  Unable to assess HSM due to body habitus. Ext:  Trace pretibial edema bilaterally. Skin: Warm and dry without rash.  Echo (12/19/2017): Normal LV size and wall thickness.  LVEF 55 to 60% with normal wall motion.  Grade 1 diastolic dysfunction.  Mild left atrial enlargement.  Normal RV size and function.  No significant valvular abnormalities.  Exercise MPI (01/08/2018): Decreased exercise capacity.  No ischemia or scar.  LVEF greater than 65%.  Lab Results  Component Value Date   WBC 6.9 01/04/2018   HGB 12.9 (L) 01/04/2018   HCT 40.2 01/04/2018   MCV 93.9 01/04/2018   PLT 130 (L) 01/04/2018    Lab Results  Component Value Date   NA 136 01/04/2018   K 4.6 01/04/2018   CL 102 01/04/2018   CO2 21 (L) 01/04/2018   BUN 25 (H) 01/04/2018   CREATININE 1.08 01/04/2018   GLUCOSE 264 (H) 01/04/2018   ALT 37 01/03/2018    Lab Results  Component Value Date   CHOL 149 03/20/2017   HDL 47 03/20/2017   LDLCALC 52 03/20/2017   LDLDIRECT 73.0 08/22/2015   TRIG 250 (H) 03/20/2017   CHOLHDL 3.2 03/20/2017    --------------------------------------------------------------------------------------------------  ASSESSMENT AND PLAN: Coronary artery disease No symptoms to suggest worsening coronary ischemia.   Myocardial perfusion stress test in January was low-risk.  Continue secondary prevention.  Shortness of breath Likely multifactorial and improved with switch from ramipril to irbesartan.  I have encouraged weight loss and exercise.  Hypertension Blood pressure suboptimally controlled.  We will increase irbesartan to 150 mg daily, with BMP and BP check in ~2 weeks.  Hyperlipidemia LDL at goal on last check last year.  We will continue ezetimibe, fenofibrate and pitavastatin, and check a fasting lipid panel when Mr. Arganbright returns for labs in 2 weeks.  Morbid obesity BMI > 40 with multiple comorbities.  Weight loss through diet and exercise was encouraged.  Follow-up: Return to clinic in 6 months.   , MD 06/19/2018 10:41 AM  

## 2018-06-19 NOTE — Telephone Encounter (Signed)
New message    Patient's spouse is calling to verify medication dosage changes and baseline BP. Spouse states her husband does not hear well and needs clarification from today's office visit.

## 2018-06-23 ENCOUNTER — Telehealth: Payer: Self-pay

## 2018-06-23 NOTE — Telephone Encounter (Signed)
Received fax from [facility] stating that all Irbesartan strengths are on back order and please consider alternatives.

## 2018-06-23 NOTE — Telephone Encounter (Signed)
He can continue to use his current supply of irbesartan, then switch to losartan 50 mg daily.  Nelva Bush, MD [facility] Pager: [phone]

## 2018-06-24 ENCOUNTER — Other Ambulatory Visit: Payer: Self-pay

## 2018-06-24 MED ORDER — LOSARTAN POTASSIUM 50 MG PO TABS: 50.0000 mg | ORAL_TABLET | Freq: Every day | ORAL | 3 refills | Status: DC

## 2018-06-24 NOTE — Telephone Encounter (Signed)
Spoke with the patient's wife and informed her of medication change per Dr End.  She verbalized understanding.

## 2018-06-27 ENCOUNTER — Telehealth: Payer: Self-pay | Admitting: Internal Medicine

## 2018-06-27 NOTE — Telephone Encounter (Signed)
Spoke with [employer] from [facility] [employer] and informed her that we had switched the patient's Irbesartan to Losartan 3 days ago.  She verbalized understanding and made the change to appropriate medication.

## 2018-06-27 NOTE — Telephone Encounter (Signed)
New Message:       Pt c/o medication issue:  1. Name of Medication: Isbesartan  2. How are you currently taking this medication (dosage and times per day)? N/A  3. Are you having a reaction (difficulty breathing--STAT)? N/A  4. What is your medication issue? Amber from [facility] is calling and states that this medication is on back order and states can we suggest another medication for this pt.

## 2018-07-09 ENCOUNTER — Telehealth: Payer: Self-pay | Admitting: Internal Medicine

## 2018-07-09 ENCOUNTER — Other Ambulatory Visit: Payer: Medicare Other

## 2018-07-09 ENCOUNTER — Ambulatory Visit (INDEPENDENT_AMBULATORY_CARE_PROVIDER_SITE_OTHER): Payer: Medicare Other | Admitting: Pharmacist

## 2018-07-09 VITALS — BP 140/76 | HR 83

## 2018-07-09 DIAGNOSIS — I10 Essential (primary) hypertension: Secondary | ICD-10-CM

## 2018-07-09 LAB — BASIC METABOLIC PANEL
BUN/Creatinine Ratio: 16 (ref 10–24)
BUN: 16 mg/dL (ref 8–27)
CO2: 20 mmol/L (ref 20–29)
Calcium: 9.8 mg/dL (ref 8.6–10.2)
Chloride: 102 mmol/L (ref 96–106)
Creatinine, Ser: 0.98 mg/dL (ref 0.76–1.27)
GFR calc Af Amer: 89 mL/min/{1.73_m2} (ref >59)
GFR calc non Af Amer: 77 mL/min/{1.73_m2} (ref >59)
Glucose: 134 mg/dL — ABNORMAL HIGH (ref 65–99)
Potassium: 4.8 mmol/L (ref 3.5–5.2)
Sodium: 139 mmol/L (ref 134–144)

## 2018-07-09 LAB — LIPID PANEL
Chol/HDL Ratio: 3.3 ratio (ref 0.0–5.0)
Cholesterol, Total: 136 mg/dL (ref 100–199)
HDL: 41 mg/dL (ref >39)
LDL Calculated: 45 mg/dL (ref 0–99)
Triglycerides: 250 mg/dL — ABNORMAL HIGH (ref 0–149)
VLDL Cholesterol Cal: 50 mg/dL — ABNORMAL HIGH (ref 5–40)

## 2018-07-09 MED ORDER — LOSARTAN POTASSIUM 100 MG PO TABS: 100.0000 mg | ORAL_TABLET | Freq: Every day | ORAL | 3 refills | Status: DC

## 2018-07-09 NOTE — Telephone Encounter (Signed)
New message    Please call Mrs. Sawa she would like to speak to you about her husbands labs, she thought he was to have a follow up appt with Dr Saunders Revel

## 2018-07-09 NOTE — Telephone Encounter (Signed)
Left a message for the pt and/or wife to call the office back and request to speak with a triage nurse, for further assistance with lab questions.

## 2018-07-09 NOTE — Patient Instructions (Addendum)
Increase your irbesartan to 300mg  daily (you can take 4 of your 75mg  tablets each day until you run out). Then, switch to an equivalent dose of losartan 100mg  (take 2 of your 50mg  tablets)  Monitor your blood pressure at home. Check your pressure at least 2 hours after taking your medications. Your blood pressure goal is < 130/71mmHg. (Ideally would like to keep your readings between 110-130/60-80). Please bring your home cuff and readings to your next visit   Look for Jimmy [name] to use as a salt substitute   Start using your Silver Sneakers program  Follow up in clinic in 4 weeks for lab work and blood pressure check

## 2018-07-09 NOTE — Progress Notes (Signed)
Patient ID: Jimmy [name]                 DOB: March 01, 1946                      MRN: [id number]     HPI: Jimmy [name] is a 72 y.o. male referred by Dr. Saunders [name] to HTN clinic. PMH is significant for non-obstructive CAD, HTN, HLD, DM, obesity, and OSA. BP was elevated at 140/72 at last visit and irbesartan was increased to 18m daily. He was then switched from irbesartan to losartan a few days later due to irbesartan backorder.  Patient presents today with his wife in good spirits. He has still been using his irbesartan supply and has not yet switched over to losartan. He is tolerating his ARB therapy well. Denies dizziness, blurred vision, falls, or headache. Home BP readings in the 130s/70s. Pt reports white coat HTN. He has not been using NSAIDs. He does add salt to all of his food. He avoids caffeine. Exercise is currently minimal. He does have a Silver SMotorolabut has not been using it.  Current HTN meds: losartan 531mdaily Previously tried: ramipril - SOB BP goal: <130/8078m  Family History: Non-contributory.  Social History: Former smoker 1 PPD for 40 years, quit in 2000. 1 shot of liquor per week. Denies illicit drug use.  Diet: Adds salt to all of his food. Wife prepares fresh vegetables, lean meats. Does not drink caffeine.  Exercise: Has Silver SneMotorolat is not using.  Home BP readings: 130s/70s at home  Wt Readings from Last 3 Encounters:  06/19/18 267 lb (121.1 kg)  05/16/18 273 lb (123.8 kg)  04/03/18 271 lb 12.8 oz (123.3 kg)   BP Readings from Last 3 Encounters:  06/19/18 140/72  05/16/18 140/72  04/03/18 126/74   Pulse Readings from Last 3 Encounters:  06/19/18 85  05/16/18 91  04/03/18 87    Renal function: CrCl cannot be calculated (Patient's most recent lab result is older than the maximum 21 days allowed.).  Past Medical History:  Diagnosis Date  . Astigmatism of both eyes 11/01/2016  . Bronchitis   . Cortical age-related  cataract of both eyes 11/01/2016  . Drug-induced osteoporosis   . Dyslipidemia   . Fatigue   . Fatty liver   . Hypercholesteremia   . Hypogonadism male   . 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 Normal nuclear stress test March 2011  . Nuclear sclerotic cataract of both eyes 11/01/2016  . Obesity   . [facility]  s/p seed implant and radiation  . Sleep apnea   . Vitamin D insufficiency     Current Outpatient Medications on File Prior to Visit  Medication Sig Dispense Refill  . ASPIRIN PO Take 81 mg by mouth daily.     . B-D UF III MINI PEN NEEDLES 31G X 5 MM MISC USE TO INJECT INSULIN EVERY DAY 100 each 1  . blood glucose meter kit and supplies Dispense One touch ultra meter. E11.9 1 each 0  . cholecalciferol (VITAMIN D) 1000 UNITS tablet Take 2,000 Units by mouth daily.     . eMarland Kitchenetimibe (ZETIA) 10 MG tablet TAKE 1 TABLET BY MOUTH EVERY DAY 90 tablet 1  . fenofibrate 160 MG tablet Take 1 tablet (160 mg total) by mouth daily. 90[place]  tablet 3  . LIVALO 4 MG TABS TAKE 1 TABLET BY MOUTH EVERY DAY 90 tablet 3  . losartan (COZAAR) 50 MG tablet Take 1 tablet (50 mg total) by mouth daily. 90 tablet 3  . Magnesium 250 MG TABS Take 200 mg by mouth.     . metFORMIN (GLUCOPHAGE) 1000 MG tablet TAKE 1 TABLET BY MOUTH TWICE A DAY WITH A MEAL 180 tablet 1  . Multiple Vitamins-Minerals (MULTIVITAMIN WITH MINERALS) tablet Take 1 tablet by mouth daily.      . naproxen sodium (ALEVE) 220 MG tablet Take 220 mg by mouth daily as needed (pain).    [name] omega-3 acid ethyl esters (LOVAZA) 1 g capsule TAKE 4 CAPSULES BY MOUTH EVERY DAY 360 capsule 1  . ONE TOUCH ULTRA TEST test strip USE TO TEST BLOOD SUGAR DAILY AS DIRECTED BY DOCTOR 100 each 3  . ONETOUCH DELICA LANCETS 11E MISC TEST ONCE DAILY AS DIRECTED 100 each 2  . VICTOZA 18 MG/3ML SOPN INJECT 1.2 MG INTO THE SKIN DAILY. 18 pen 1   No current  facility-administered medications on file prior to visit.     Allergies  Allergen Reactions  . Ace Inhibitors Cough  . Cephalexin   . Crestor [Rosuvastatin Calcium]   . Lipitor [Atorvastatin Calcium] Other (See Comments)    MUSCLE PAINS   . Plavix [Clopidogrel Bisulfate] Hives     Assessment/Plan:  1. Hypertension - BP above goal <130/77mHg. Will increase irbesartan to 3064mdaily. When pt runs out in a week, will switch to equivalent dose of losartan 1004maily due to irbesartan backorder. Checking BMET today. Encouraged pt to use Mrs DasDeliah Bostonstead of regular salt with all of his meals. Encouraged him to start walking more and using Silver Sneakers. F/u in HTN clinic in 4 weeks for BP check and BMET.   Jimmy [name] E. Jimmy [name], PharmD, BCACP, CPPChino Valley[address]rePark[address] VillageC [id number]: [phone]: ([phone]/2019 11:00 AM

## 2018-07-14 NOTE — Telephone Encounter (Signed)
Sent message via [place].  Pt was seen in office4 07/09/2018 same day as phone call.  Will await further needs.

## 2018-07-18 NOTE — Telephone Encounter (Signed)
Spoke with patient wife regarding blood pressures. He was outside and felt poorly yesterday, but otherwise has felt well. He feels fine today. Advised if he started to feel poorly over weekend to give him 1/2 dose of irbesartan and call on Monday for further instruction. If feels fine continue on as directed and keep follow up on 08/06/18. She states understanding.

## 2018-07-23 ENCOUNTER — Telehealth: Payer: Self-pay | Admitting: Pharmacist

## 2018-07-23 NOTE — Telephone Encounter (Signed)
Spoke with patient's wife. Reports BP readings are dropping too low on losartan 100mg  daily and too high on losartan 50mg  daily. Will try pt on losartan 75mg  daily. Pt aware to take 1.5 tablets of the 50mg  dose. He will monitor BP at home and call with concerns.

## 2018-08-04 ENCOUNTER — Ambulatory Visit: Payer: Medicare Other | Admitting: Family Medicine

## 2018-08-04 ENCOUNTER — Encounter: Payer: Self-pay | Admitting: Family Medicine

## 2018-08-04 VITALS — BP 124/78 | HR 82 | Temp 98.0°F | Wt 268.0 lb

## 2018-08-04 DIAGNOSIS — I10 Essential (primary) hypertension: Secondary | ICD-10-CM

## 2018-08-04 DIAGNOSIS — E785 Hyperlipidemia, unspecified: Secondary | ICD-10-CM

## 2018-08-04 DIAGNOSIS — E118 Type 2 diabetes mellitus with unspecified complications: Secondary | ICD-10-CM

## 2018-08-04 DIAGNOSIS — G4730 Sleep apnea, unspecified: Secondary | ICD-10-CM

## 2018-08-04 DIAGNOSIS — E1169 Type 2 diabetes mellitus with other specified complication: Secondary | ICD-10-CM | POA: Diagnosis not present

## 2018-08-04 DIAGNOSIS — E1159 Type 2 diabetes mellitus with other circulatory complications: Secondary | ICD-10-CM | POA: Diagnosis not present

## 2018-08-04 DIAGNOSIS — Z8546 Personal history of malignant neoplasm of prostate: Secondary | ICD-10-CM

## 2018-08-04 DIAGNOSIS — I152 Hypertension secondary to endocrine disorders: Secondary | ICD-10-CM

## 2018-08-04 LAB — POCT GLYCOSYLATED HEMOGLOBIN (HGB A1C): Hemoglobin A1C: 7.2 % — AB (ref 4.0–5.6)

## 2018-08-04 NOTE — Progress Notes (Signed)
  Subjective:    Patient ID: Jimmy [name], male    DOB: May 23, 1946, 72 y.o.   MRN: [id number]  Jimmy [name] is a 72 y.o. male who presents for follow-up of Type 2 diabetes mellitus.  Patient is checking home blood sugars.   Home blood sugar records: meter record How often is blood sugars being checked: QD Current symptoms/problems include none and have been unchanged. Daily foot checks: yes  Any foot concerns: no Last eye exam: today Exercise: bike for 30 min QOD He continues on metformin and Victoza.  He is also taking losartan for his blood pressure.  He continues on Zetia, fenofibrate and labetalol for his cholesterol.  He is also taking omega-3.  He has had difficulty recently with his CPAP becoming clogged up.  He sees urology yearly for follow-up on his underlying state cancer.  It was diagnosed in 2006. The following portions of the patient's history were reviewed and updated as appropriate: allergies, current medications, past medical history, past social history and problem list.  ROS as in subjective above.     Objective:    Physical Exam Alert and in no distress otherwise not examined.   Lab Review Diabetic Labs Latest Ref Rng & Units 07/09/2018 04/03/2018 01/04/2018 01/03/2018 12/12/2017  HbA1c - - 7.6 - 6.9(H) -  Microalbumin mg/L - - - - -  Micro/Creat Ratio - - - - - -  Chol 100 - 199 mg/dL 136 - - - -  HDL >39 mg/dL 41 - - - -  Calc LDL 0 - 99 mg/dL 45 - - - -  Triglycerides 0 - 149 mg/dL 250(H) - - - -  Creatinine 0.76 - 1.27 mg/dL 0.98 - 1.08 0.98 1.03   BP/Weight 07/09/2018 06/19/2018 05/16/2018 09/02/2670 02/04/5808  Systolic BP 983 382 505 397 673  Diastolic BP 76 72 72 74 66  Wt. (Lbs) - 267 273 271.8 269  BMI - 41.82 42.13 41.94 41.51   Foot/eye exam completion dates Latest Ref Rng & Units 01/30/2018 11/01/2016  Eye Exam No Retinopathy No Retinopathy No Retinopathy  Foot Form Completion - - -  A1c is 7.2  Elishah  reports that he quit smoking about 18 years  ago. His smoking use included cigarettes. He has a 40.00 pack-year smoking history. He has never used smokeless tobacco. He reports that he drinks about 0.6 oz of alcohol per week. He reports that he does not use drugs.     Assessment & Plan:    Type 2 diabetes mellitus with complication, unspecified whether long term insulin use ([place]) - Plan: POCT glycosylated hemoglobin (Hb A1C)  Hyperlipidemia associated with type 2 diabetes mellitus ([place])  Hypertension associated with type 2 diabetes mellitus ([place])  Morbid obesity due to excess calories ([place])  History of prostate cancer  Morbid obesity ([place])  Sleep apnea, unspecified type   1. Rx changes: none 2. Education: Reviewed 'ABCs' of diabetes management (respective goals in parentheses):  A1C (<7), blood pressure (<130/80), and cholesterol (LDL <100). 3. Compliance at present is estimated to be good. Efforts to improve compliance (if necessary) will be directed at increased exercise. 4. Follow up: 4 months He is to call his DME provider and see if they can help you resolve the problem sure

## 2018-08-06 ENCOUNTER — Ambulatory Visit (INDEPENDENT_AMBULATORY_CARE_PROVIDER_SITE_OTHER): Payer: Medicare Other | Admitting: Pharmacist

## 2018-08-06 ENCOUNTER — Encounter: Payer: Self-pay | Admitting: Pharmacist

## 2018-08-06 VITALS — BP 128/78 | HR 82

## 2018-08-06 DIAGNOSIS — I10 Essential (primary) hypertension: Secondary | ICD-10-CM

## 2018-08-06 DIAGNOSIS — E1159 Type 2 diabetes mellitus with other circulatory complications: Secondary | ICD-10-CM

## 2018-08-06 DIAGNOSIS — I152 Hypertension secondary to endocrine disorders: Secondary | ICD-10-CM

## 2018-08-06 LAB — BASIC METABOLIC PANEL
BUN/Creatinine Ratio: 18 (ref 10–24)
BUN: 19 mg/dL (ref 8–27)
CO2: 22 mmol/L (ref 20–29)
Calcium: 10.3 mg/dL — ABNORMAL HIGH (ref 8.6–10.2)
Chloride: 103 mmol/L (ref 96–106)
Creatinine, Ser: 1.04 mg/dL (ref 0.76–1.27)
GFR calc Af Amer: 83 mL/min/{1.73_m2} (ref >59)
GFR calc non Af Amer: 71 mL/min/{1.73_m2} (ref >59)
Glucose: 130 mg/dL — ABNORMAL HIGH (ref 65–99)
Potassium: 5.2 mmol/L (ref 3.5–5.2)
Sodium: 140 mmol/L (ref 134–144)

## 2018-08-06 MED ORDER — LOSARTAN POTASSIUM 50 MG PO TABS: 75.0000 mg | ORAL_TABLET | Freq: Every day | ORAL | 3 refills | Status: DC

## 2018-08-06 NOTE — Patient Instructions (Addendum)
It was great seeing you today.  Continue losartan at 75 mg daily. Your blood pressure goal is <130/80. Continue to monitor your blood pressure on your cuff at home. Try to check your blood pressure about 2 hours after you take your medications. If you notice any abnormally high or low numbers, give the hypertension clinic a call. Continue with diet and exercise improvements. Call clinic in mid September to schedule follow-up with Dr. Saunders Revel at [phone].

## 2018-08-06 NOTE — Progress Notes (Signed)
Patient ID: Jimmy [name]                 DOB: 03-21-1946                      MRN: [id number]     HPI: Jimmy [name] is a 72 y.o. male referred by Dr. Saunders Revel to HTN clinic. PMH is significant for non-obstructive CAD, HTN, HLD, DM, obesity, and OSA. At last HTN clinic visit (07/09/2018), BP was elevated (140/76). Patient was switched from ibesartan 150 mg to losartan 100 mg. After the change, patient's wife called on 07/23/2018 stating patient's BP were running low on the losartan 100 mg. Changed to losartan 75 mg daily (1.5 tabs).  At PCP visit on 08/04/2018 BP was at goal at 124/78.   Patient presents to clinic today for BP management. Was on losartan 100 mg for a week and a half and then was having BPs in the 110s-120s/50s-70s and was feeling dizzy, spacey, and weak. Pt has felt better since decreasing losartan dose to 26m daily. Denies dizziness, SOB, chest pain. Uses naproxen occasionally (hasn't taken any in the past 3 weeks). Pt plans to go to beach for 6 weeks and walk more. He has not been using his Silver SEngelhard Corporationprogram frequently. Checked home BP with manual cuff in office and home BP cuff had some sporadic readings (156/81, then 175/102 about 5 minutes later, then 132/77 about a minute after). Last reading did correlate well with clinic reading.  Current HTN meds: losartan 75 mg daily Previously tried: ramipril- SOB BP goal: <130/80  Family History: Non-contributory  Social History: Former smoker 1 PPD for 40 years, quit in 2000. 1 shot of liquor per week. Denies illicit drug use.  Diet: No longer adds salt to food. Wife prepares fresh vegetables, lean meats. Does not drink caffeine. Has red meat maybe twice a month, mostly chicken, pork, and fish.   Exercise: Has Silver SMotorolabut is not using. But has been doing yardwork which is more than usual.   Home BP readings: has home arm cuff over 833134years old, readings after decrease in losartan dose were initially in the  140s-150s/60s but has remained lower over the past week, normally in the 110s-120s/60s.   Wt Readings from Last 3 Encounters:  08/04/18 268 lb (121.6 kg)  06/19/18 267 lb (121.1 kg)  05/16/18 273 lb (123.8 kg)   BP Readings from Last 3 Encounters:  08/04/18 124/78  07/09/18 140/76  06/19/18 140/72   Pulse Readings from Last 3 Encounters:  08/06/18 82  08/04/18 82  07/09/18 83    Renal function: CrCl cannot be calculated (Patient's most recent lab result is older than the maximum 21 days allowed.).  Past Medical History:  Diagnosis Date  . Astigmatism of both eyes 11/01/2016  . Bronchitis   . Cortical age-related cataract of both eyes 11/01/2016  . Drug-induced osteoporosis   . Dyslipidemia   . Fatigue   . Fatty liver   . Hypercholesteremia   . Hypogonadism male   . 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 Normal nuclear stress test March 2011  . Nuclear sclerotic cataract of both eyes 11/01/2016  . Obesity   . Prostate cancer ([facility]    s/p seed implant and radiation  . Sleep apnea   . Vitamin D insufficiency  Current Outpatient Medications on File Prior to Visit  Medication Sig Dispense Refill  . ASPIRIN PO Take 81 mg by mouth daily.     . B-D UF III MINI PEN NEEDLES 31G X 5 MM MISC USE TO INJECT INSULIN EVERY DAY 100 each 1  . blood glucose meter kit and supplies Dispense One touch ultra meter. E11.9 1 each 0  . cholecalciferol (VITAMIN D) 1000 UNITS tablet Take 2,000 Units by mouth daily.     [name] ezetimibe (ZETIA) 10 MG tablet TAKE 1 TABLET BY MOUTH EVERY DAY 90 tablet 1  . fenofibrate 160 MG tablet Take 1 tablet (160 mg total) by mouth daily. 90 tablet 3  . LIVALO 4 MG TABS TAKE 1 TABLET BY MOUTH EVERY DAY 90 tablet 3  . Magnesium 250 MG TABS Take 200 mg by mouth.     . metFORMIN (GLUCOPHAGE) 1000 MG tablet TAKE 1 TABLET BY MOUTH TWICE A DAY WITH A MEAL 180 tablet 1  .  Multiple Vitamins-Minerals (MULTIVITAMIN WITH MINERALS) tablet Take 1 tablet by mouth daily.      . naproxen sodium (ALEVE) 220 MG tablet Take 220 mg by mouth daily as needed (pain).    [name] omega-3 acid ethyl esters (LOVAZA) 1 g capsule TAKE 4 CAPSULES BY MOUTH EVERY DAY 360 capsule 1  . ONE TOUCH ULTRA TEST test strip USE TO TEST BLOOD SUGAR DAILY AS DIRECTED BY DOCTOR 100 each 3  . ONETOUCH DELICA LANCETS 78M MISC TEST ONCE DAILY AS DIRECTED 100 each 2  . VICTOZA 18 MG/3ML SOPN INJECT 1.2 MG INTO THE SKIN DAILY. 18 pen 1   No current facility-administered medications on file prior to visit.     Allergies  Allergen Reactions  . Ace Inhibitors Cough  . Cephalexin   . Crestor [Rosuvastatin Calcium]   . Lipitor [Atorvastatin Calcium] Other (See Comments)    MUSCLE PAINS   . Plavix [Clopidogrel Bisulfate] Hives     Assessment/Plan:  1. Hypertension - BP currently controlled at goal <130/70mHg. Continue losartan 75 mg daily. Advised patient to call HTN clinic if any abnormal readings are noted. Encouraged to continue with diet and exercise improvements. [name] understood that if they have abnormal BP readings again on their home machine, to wait a few minutes and check again. Educated patient on risks of naproxen with HTN and his losartan. Patient will schedule appointment with Dr. ESaunders Revelin December. Follow-up at HTN clinic as needed.    Patient seen with WDanella Penton PharmD Candidate  Santez Woodcox E. Lenaya Pietsch, PharmD, BCACP, CSykes[place] [place] [address]Phone: [phone] Fax: [phone]/06/2018 12:05 PM

## 2018-08-23 ENCOUNTER — Other Ambulatory Visit: Payer: Self-pay | Admitting: Family Medicine

## 2018-08-31 ENCOUNTER — Other Ambulatory Visit: Payer: Self-pay | Admitting: Family Medicine

## 2018-10-13 ENCOUNTER — Other Ambulatory Visit: Payer: Self-pay | Admitting: Family Medicine

## 2018-11-03 ENCOUNTER — Other Ambulatory Visit (INDEPENDENT_AMBULATORY_CARE_PROVIDER_SITE_OTHER): Payer: Medicare Other

## 2018-11-03 DIAGNOSIS — Z23 Encounter for immunization: Secondary | ICD-10-CM | POA: Diagnosis not present

## 2018-11-18 ENCOUNTER — Other Ambulatory Visit: Payer: Self-pay | Admitting: Internal Medicine

## 2018-11-18 DIAGNOSIS — I2510 Atherosclerotic heart disease of native coronary artery without angina pectoris: Secondary | ICD-10-CM

## 2018-11-18 DIAGNOSIS — E785 Hyperlipidemia, unspecified: Secondary | ICD-10-CM

## 2018-11-18 NOTE — Telephone Encounter (Signed)
Refill Request.  

## 2018-11-22 ENCOUNTER — Other Ambulatory Visit: Payer: Self-pay | Admitting: Family Medicine

## 2018-11-30 ENCOUNTER — Other Ambulatory Visit: Payer: Self-pay | Admitting: Family Medicine

## 2018-12-01 ENCOUNTER — Other Ambulatory Visit: Payer: Self-pay | Admitting: Internal Medicine

## 2018-12-01 DIAGNOSIS — R918 Other nonspecific abnormal finding of lung field: Secondary | ICD-10-CM

## 2018-12-01 DIAGNOSIS — R911 Solitary pulmonary nodule: Secondary | ICD-10-CM

## 2018-12-09 ENCOUNTER — Encounter: Payer: Self-pay | Admitting: Family Medicine

## 2018-12-09 ENCOUNTER — Ambulatory Visit: Payer: Medicare Other | Admitting: Family Medicine

## 2018-12-09 VITALS — BP 134/86 | HR 86 | Temp 97.5°F | Wt 275.6 lb

## 2018-12-09 DIAGNOSIS — I10 Essential (primary) hypertension: Secondary | ICD-10-CM

## 2018-12-09 DIAGNOSIS — E1159 Type 2 diabetes mellitus with other circulatory complications: Secondary | ICD-10-CM

## 2018-12-09 DIAGNOSIS — E785 Hyperlipidemia, unspecified: Secondary | ICD-10-CM

## 2018-12-09 DIAGNOSIS — E118 Type 2 diabetes mellitus with unspecified complications: Secondary | ICD-10-CM | POA: Diagnosis not present

## 2018-12-09 DIAGNOSIS — E1169 Type 2 diabetes mellitus with other specified complication: Secondary | ICD-10-CM | POA: Diagnosis not present

## 2018-12-09 DIAGNOSIS — I152 Hypertension secondary to endocrine disorders: Secondary | ICD-10-CM

## 2018-12-09 LAB — POCT GLYCOSYLATED HEMOGLOBIN (HGB A1C): Hemoglobin A1C: 8 % — AB (ref 4.0–5.6)

## 2018-12-09 MED ORDER — LIRAGLUTIDE 18 MG/3ML ~~LOC~~ SOPN: 1.8000 mg | PEN_INJECTOR | Freq: Every day | SUBCUTANEOUS | 5 refills | Status: DC

## 2018-12-09 NOTE — Progress Notes (Signed)
  Subjective:    Patient ID: Jimmy [name], male    DOB: 1946/03/26, 72 y.o.   MRN: [id number]  Jimmy [name] is a 72 y.o. male who presents for follow-up of Type 2 diabetes mellitus.  Patient is checking home blood sugars.   Home blood sugar records: write it down How often is blood sugars being checked: QD fasting 179-256 Current symptoms/problems include higher reading and have been going on for about a month. Daily foot checks: yes   Any foot concerns: none Last eye exam: 08/2018 Exercise: walking , riding bike He continues on Glucophage as well as Victoza.  He also is taking fenofibrate, Zetia,Livalo The following portions of the patient's history were reviewed and updated as appropriate: allergies, current medications, past medical history, past social history and problem list.  ROS as in subjective above.     Objective:    Physical Exam Alert and in no distress otherwise not examined.    Lab Review Diabetic Labs Latest Ref Rng & Units 08/06/2018 08/04/2018 07/09/2018 04/03/2018 01/04/2018  HbA1c 4.0 - 5.6 % - 7.2(A) - 7.6 -  Microalbumin mg/L - - - - -  Micro/Creat Ratio - - - - - -  Chol 100 - 199 mg/dL - - 136 - -  HDL >39 mg/dL - - 41 - -  Calc LDL 0 - 99 mg/dL - - 45 - -  Triglycerides 0 - 149 mg/dL - - 250(H) - -  Creatinine 0.76 - 1.27 mg/dL 1.04 - 0.98 - 1.08   BP/Weight 08/06/2018 08/04/2018 07/09/2018 06/19/2018 3/55/9741  Systolic BP 638 453 646 803 212  Diastolic BP 78 78 76 72 72  Wt. (Lbs) - 268 - 267 273  BMI - 41.97 - 41.82 42.13   Foot/eye exam completion dates Latest Ref Rng & Units 01/30/2018 11/01/2016  Eye Exam No Retinopathy No Retinopathy No Retinopathy  Foot Form Completion - - -  A1c is 8.0  Jimmy [name]  reports that he quit smoking about 19 years ago. His smoking use included cigarettes. He has a 40.00 pack-year smoking history. He has never used smokeless tobacco. He reports that he drinks about 1.0 standard drinks of alcohol per week. He reports that he  does not use drugs.     Assessment & Plan:    Type II diabetes mellitus with complication ([place]) - Plan: liraglutide (VICTOZA) 18 MG/3ML SOPN  Hyperlipidemia associated with type 2 diabetes mellitus ([place])  Hypertension associated with type 2 diabetes mellitus ([place])  Morbid obesity due to excess calories ([place])   1. Rx changes: Increase Victoza to 1.8 2. Education: Reviewed 'ABCs' of diabetes management (respective goals in parentheses):  A1C (<7), blood pressure (<130/80), and cholesterol (LDL <100). 3. Compliance at present is estimated to be fair. Efforts to improve compliance (if necessary) will be directed at increased exercise. 4. Follow up: 4 months I also discussed possibly giving him another medication or possibly referring him to the weight loss and wellness center.  We will discus again with his next visit.

## 2018-12-15 ENCOUNTER — Ambulatory Visit: Payer: Medicare Other | Admitting: Cardiology

## 2018-12-15 ENCOUNTER — Encounter: Payer: Self-pay | Admitting: Cardiology

## 2018-12-15 VITALS — BP 158/82 | HR 82 | Ht 67.0 in | Wt 278.1 lb

## 2018-12-15 DIAGNOSIS — E782 Mixed hyperlipidemia: Secondary | ICD-10-CM

## 2018-12-15 DIAGNOSIS — I2510 Atherosclerotic heart disease of native coronary artery without angina pectoris: Secondary | ICD-10-CM | POA: Diagnosis not present

## 2018-12-15 DIAGNOSIS — R0609 Other forms of dyspnea: Secondary | ICD-10-CM

## 2018-12-15 DIAGNOSIS — R0600 Dyspnea, unspecified: Secondary | ICD-10-CM

## 2018-12-15 MED ORDER — LOSARTAN POTASSIUM 100 MG PO TABS: 100.0000 mg | ORAL_TABLET | Freq: Every day | ORAL | 3 refills | Status: DC

## 2018-12-15 NOTE — Progress Notes (Signed)
Cardiology Office Note:    Date:  12/15/2018   ID:  Jimmy [name], DOB 27-Dec-1946, MRN [id number]  PCP:  Jimmy Lung, MD  Cardiologist:  Jimmy Furbish, MD  Electrophysiologist:  None   Referring MD: Jimmy Lung, MD     History of Present Illness:    Jimmy [name] is a 72 y.o. male here for follow-up of coronary artery disease, former patient of Jimmy [name].  Has nonobstructive coronary artery disease and possible RCA dissection and left heart catheterization in 2000 with hypertension hyperlipidemia diabetes obesity and obstructive sleep apnea.  He was having some progressive exertional dyspnea in December 2018.  He went forward with a stress test and an echocardiogram neither which showed any significant abnormalities.  Jimmy [name] with pulmonary medicine saw him and switched him from ramipril to Jimmy [name].  Since then, Jimmy [name] noted resolution of the shortness of breath.  Overall he has been doing quite well.  He stopped taking the fenofibrate and he says that his leg pain has improved.  No chest pain no fevers chills nausea vomiting syncope.  Jimmy [name] talked to him at length about weight loss.  Excellent.  Past Medical History:  Diagnosis Date  . Astigmatism of both eyes 11/01/2016  . Bronchitis   . Cortical age-related cataract of both eyes 11/01/2016  . Drug-induced osteoporosis   . Dyslipidemia   . Fatigue   . Fatty liver   . Hypercholesteremia   . Hypogonadism male   . 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 Normal nuclear stress test March 2011  . Nuclear sclerotic cataract of both eyes 11/01/2016  . Obesity   . Prostate cancer [facility])    s/p seed implant and radiation  . Sleep apnea   . Vitamin D insufficiency     Past Surgical History:  Procedure Laterality Date  . CARDIAC CATHETERIZATION  April 2000  . RADIOACTIVE SEED IMPLANT  2009   Implanted radiation  seeds    Current Medications: Current Meds  Medication Sig  . ASPIRIN PO Take 81 mg by mouth daily.   . B-D UF III MINI PEN NEEDLES 31G X 5 MM MISC USE TO INJECT INSULIN EVERY DAY  . blood glucose meter kit and supplies Dispense One touch ultra meter. E11.9  . cholecalciferol (VITAMIN D) 1000 UNITS tablet Take 2,000 Units by mouth daily.   [name] ezetimibe (ZETIA) 10 MG tablet TAKE 1 TABLET BY MOUTH EVERY DAY  . liraglutide (VICTOZA) 18 MG/3ML SOPN Inject 0.3 mLs (1.8 mg total) into the skin daily.  [name] LIVALO 4 MG TABS TAKE 1 TABLET BY MOUTH EVERY DAY  . Magnesium 250 MG TABS Take 200 mg by mouth.   . metFORMIN (GLUCOPHAGE) 1000 MG tablet TAKE 1 TABLET BY MOUTH TWICE A DAY WITH A MEAL  . Multiple Vitamins-Minerals (MULTIVITAMIN WITH MINERALS) tablet Take 1 tablet by mouth daily.    . naproxen sodium (ALEVE) 220 MG tablet Take 220 mg by mouth daily as needed (pain).  [name] omega-3 acid ethyl esters (LOVAZA) 1 g capsule TAKE 4 CAPSULES BY MOUTH EVERY DAY  . ONE TOUCH ULTRA TEST test strip USE TO TEST BLOOD SUGAR DAILY AS DIRECTED BY DOCTOR  . ONETOUCH DELICA LANCETS 88F MISC TEST ONCE DAILY AS DIRECTED  . ONETOUCH DELICA LANCETS 02D MISC USE AS DIRECTED TO TEST BLOOD SUGAR EVERY DAY  . [DISCONTINUED] fenofibrate  160 MG tablet Take 1 tablet (160 mg total) by mouth daily.  . [DISCONTINUED] losartan (COZAAR) 50 MG tablet Take 1.5 tablets (75 mg total) by mouth daily.     Allergies:   Ace inhibitors; Cephalexin; Crestor [rosuvastatin calcium]; Lipitor [atorvastatin calcium]; and Plavix [clopidogrel bisulfate]   Social History   Socioeconomic History  . Marital status: Married    Spouse name: Not on file  . Number of children: Not on file  . Years of education: Not on file  . Highest education level: Not on file  Occupational History  . Not on file  Social Needs  . Financial resource strain: Not on file  . Food insecurity:    Worry: Not on file    Inability: Not on file  . Transportation  needs:    Medical: Not on file    Non-medical: Not on file  Tobacco Use  . Smoking status: Former Smoker    Packs/day: 1.00    Years: 40.00    Pack years: 40.00    Types: Cigarettes    Last attempt to quit: 08/20/1999    Years since quitting: 19.3  . Smokeless tobacco: Never Used  Substance and Sexual Activity  . Alcohol use: Yes    Alcohol/week: 1.0 standard drinks    Types: 1 Shots of liquor per week  . Drug use: No  . Sexual activity: Not Currently  Lifestyle  . Physical activity:    Days per week: Not on file    Minutes per session: Not on file  . Stress: Not on file  Relationships  . Social connections:    Talks on phone: Not on file    Gets together: Not on file    Attends religious service: Not on file    Active member of club or organization: Not on file    Attends meetings of clubs or organizations: Not on file    Relationship status: Not on file  Other Topics Concern  . Not on file  Social History Narrative  . Not on file     Family History: The patient's family history includes COPD in his father.  ROS:   Please see the history of present illness.    Shortness of breath has improved, no chest pain, no fevers chills nausea vomiting syncope all other systems reviewed and are negative.  EKGs/Labs/Other Studies Reviewed:    The following studies were reviewed today: Echo (12/19/2017): Normal LV size and wall thickness.  LVEF 55 to 60% with normal wall motion.  Grade 1 diastolic dysfunction.  Mild left atrial enlargement.  Normal RV size and function.  No significant valvular abnormalities.  Exercise MPI (01/08/2018): Decreased exercise capacity.  No ischemia or scar.  LVEF greater than 65%.  EKG:  EKG is not ordered today.  01/06/2018-sinus rhythm with no other specific abnormalities.  Personally reviewed and interpreted.  Recent Labs: 01/03/2018: ALT 37; B Natriuretic Peptide 33.2 01/04/2018: Hemoglobin 12.9; Platelets 130 08/06/2018: BUN 19; Creatinine, Ser  1.04; Potassium 5.2; Sodium 140  Recent Lipid Panel    Component Value Date/Time   CHOL 136 07/09/2018 1055   TRIG 250 (H) 07/09/2018 1055   HDL 41 07/09/2018 1055   CHOLHDL 3.3 07/09/2018 1055   CHOLHDL 4.4 11/05/2016 1554   VLDL NOT CALC 11/05/2016 1554   LDLCALC 45 07/09/2018 1055   LDLDIRECT 73.0 08/22/2015 1500    Physical Exam:    VS:  BP (!) 158/82   Pulse 82   Ht 5' 7" (1.702 m)     Wt 278 lb 1.9 oz (126.2 kg)   SpO2 95%   BMI 43.56 kg/m     Wt Readings from Last 3 Encounters:  12/15/18 278 lb 1.9 oz (126.2 kg)  12/09/18 275 lb 9.6 oz (125 kg)  08/04/18 268 lb (121.6 kg)     GEN:  obese Well nourished, well developed in no acute distress HEENT: Normal NECK: No JVD; No carotid bruits LYMPHATICS: No lymphadenopathy CARDIAC: RRR, no murmurs, rubs, gallops RESPIRATORY:  Clear to auscultation without rales, wheezing or rhonchi  ABDOMEN: Soft, non-tender, non-distended MUSCULOSKELETAL:  No edema; No deformity  SKIN: Warm and dry NEUROLOGIC:  Alert and oriented x 3 PSYCHIATRIC:  Normal affect   ASSESSMENT:    1. Coronary artery disease involving native coronary artery of native heart without angina pectoris   2. Morbid obesity ([place])   3. DOE (dyspnea on exertion)   4. Mixed hyperlipidemia    PLAN:    In order of problems listed above:  Coronary artery disease - Doing quite well.  Stress test was low risk in January 2019.  Excellent.  Continue with aggressive secondary prevention.  Shortness of breath -Improved quite a bit when switching from ACE inhibitor to angiotensin receptor blocker by Dr. Melvyn Novas.  Continue with weight loss and exercise.  Diabetes with hypertension - At last visit Irbesartan was increased to 150 mg a day. Now on Losartan 114m Increase.  BP elevated today.  Coronary artery disease equivalent.  Morbid obesity - BMI 43.  Continue to encourage weight loss.  Decrease carbohydrates.  Mixed lipids  - 350 trigs at one point 250. Lovaza.  Fenofibrate. Has not taken it in a few weeks because of knee discomfort.  He still has Livalo on list.  We will see how he does off of the fenofibrate.   Medication Adjustments/Labs and Tests Ordered: Current medicines are reviewed at length with the patient today.  Concerns regarding medicines are outlined above.  No orders of the defined types were placed in this encounter.  Meds ordered this encounter  Medications  . losartan (COZAAR) 100 MG tablet    Sig: Take 1 tablet (100 mg total) by mouth daily.    Dispense:  90 tablet    Refill:  3    Dose change    Patient Instructions  Medication Instructions:  1) INCREASE Losartan to 1052monce daily  If you need a refill on your cardiac medications before your next appointment, please call your pharmacy.   Lab work: None If you have labs (blood work) drawn today and your tests are completely normal, you will receive your results only by: . Marland KitchenyChart Message (if you have MyChart) OR . A paper copy in the mail If you have any lab test that is abnormal or we need to change your treatment, we will call you to review the results.  Testing/Procedures: None  Follow-Up: [facility] and your health needs are our priority.  As part of our continuing mission to provide you with exceptional heart care, we have created designated Provider Care Teams.  These Care Teams include your primary Cardiologist (physician) and Advanced Practice Providers (APPs -  Physician Assistants and Nurse Practitioners) who all work together to provide you with the care you need, when you need it. You will need a follow up appointment in 12 months.  Please call our office 2 months in advance to schedule this appointment.  You may see Dr. SkMarlou Porchr one of the following Advanced Practice Providers  on your designated Care Team:   Truitt Merle, NP Cecilie Kicks, NP . Kathyrn Drown, NP  Any Other Special Instructions Will Be Listed Below (If Applicable).         Signed, Jimmy Furbish, MD  12/15/2018 11:36 AM    [place]

## 2018-12-15 NOTE — Patient Instructions (Signed)
Medication Instructions:  1) INCREASE Losartan to 100mg  once daily  If you need a refill on your cardiac medications before your next appointment, please call your pharmacy.   Lab work: None If you have labs (blood work) drawn today and your tests are completely normal, you will receive your results only by: [name] MyChart Message (if you have MyChart) OR . A paper copy in the mail If you have any lab test that is abnormal or we need to change your treatment, we will call you to review the results.  Testing/Procedures: None  Follow-Up: At [facility], you and your health needs are our priority.  As part of our continuing mission to provide you with exceptional heart care, we have created designated Provider Care Teams.  These Care Teams include your primary Cardiologist (physician) and Advanced Practice Providers (APPs -  Physician Assistants and Nurse Practitioners) who all work together to provide you with the care you need, when you need it. You will need a follow up appointment in 12 months.  Please call our office 2 months in advance to schedule this appointment.  You may see Dr. Marlou Porch or one of the following Advanced Practice Providers on your designated Care Team:   Truitt Merle, NP Cecilie Kicks, NP . Kathyrn Drown, NP  Any Other Special Instructions Will Be Listed Below (If Applicable).

## 2019-01-06 ENCOUNTER — Ambulatory Visit (INDEPENDENT_AMBULATORY_CARE_PROVIDER_SITE_OTHER)
Admission: RE | Admit: 2019-01-06 | Discharge: 2019-01-06 | Disposition: A | Discharge status: Home / Self Care | Payer: Medicare Other | Source: Ambulatory Visit | Attending: Internal Medicine | Admitting: Internal Medicine

## 2019-01-06 DIAGNOSIS — R918 Other nonspecific abnormal finding of lung field: Secondary | ICD-10-CM

## 2019-01-06 DIAGNOSIS — R911 Solitary pulmonary nodule: Secondary | ICD-10-CM

## 2019-01-06 NOTE — Progress Notes (Signed)
Spoke with the pt's spouse ok per DPR and notified of results per MW  She verbalized understanding  She will inform the pt

## 2019-01-15 ENCOUNTER — Telehealth: Payer: Self-pay | Admitting: Cardiology

## 2019-01-15 NOTE — Telephone Encounter (Signed)
New Message:     Please call, question about  Ezetimibe.

## 2019-01-15 NOTE — Telephone Encounter (Signed)
Called patient's wife (DPR) back about her message. Wife stated patient is complaining of pain in his joints and feeling tired, the same symptoms patient was having with Lipitor. She would like to know if it is okay to stop Zetia to see if these symptoms resolve. Will send to Dr. Marlou Porch and his nurse for advisement.   [name] stated we can send response through [place].

## 2019-01-20 NOTE — Telephone Encounter (Signed)
Left message for pt to call back to discuss joint pain and fatigue.

## 2019-01-20 NOTE — Telephone Encounter (Signed)
Thanks for the update. Stopped Zetia and feeling better. Let's get him in with the lipid clinic to discuss options.  Candee Furbish, MD

## 2019-01-20 NOTE — Telephone Encounter (Signed)
Spoke with patient's wife who reports pt stopped Zetia 5 days ago and is feeling much better now.  Aware I will forward this information to Dr Marlou Porch for his knowledge and review.  Aware I will c/b if any further orders from MD.  Pt does have f/u with his PCP soon and will be having f/u lab work then.

## 2019-01-21 NOTE — Telephone Encounter (Signed)
Spoke with wife and advised of orders for pt to see the Lipid Clinic.  Wife states understanding but at this time states pt is frustrated with having to go to so many appointments since his DX of cAnswer.  She reports he has lost a lot of weight and is doing so much better with his diet and exercise that she doesn't think it's the best idea to push him into going to another appt right now.  Pt is scheduled for f/u lab with his PCP soon where he will be having his lipids repeated.  Wife states she will let me know through MyChart once than has occurred for Dr Marlou Porch review.  I stated understanding and gave reassurance that this decision is certainly his and hers to make.  We will follow once lab has been repeated.  She thanked my the the c/b and information.

## 2019-01-28 NOTE — Addendum Note (Signed)
Addended by: [name] on: 01/28/2019 01:06 PM   Modules accepted: Orders

## 2019-03-08 ENCOUNTER — Other Ambulatory Visit: Payer: Self-pay | Admitting: Family Medicine

## 2019-04-20 ENCOUNTER — Ambulatory Visit: Payer: Medicare Other | Admitting: Family Medicine

## 2019-04-21 ENCOUNTER — Telehealth: Payer: Self-pay | Admitting: Family Medicine

## 2019-04-21 DIAGNOSIS — E118 Type 2 diabetes mellitus with unspecified complications: Secondary | ICD-10-CM

## 2019-04-21 MED ORDER — LIRAGLUTIDE 18 MG/3ML ~~LOC~~ SOPN: 1.8000 mg | PEN_INJECTOR | Freq: Every day | SUBCUTANEOUS | 1 refills | Status: DC

## 2019-04-21 NOTE — Telephone Encounter (Signed)
Pharmacy sent in request for stating that 6 pens on last 60 days needs new rx for 9 pens to last 90 days please send in new rx  of VICTOZA 18 MG3-ml  Please send to the [id number] - [place], [place] - [address]

## 2019-04-28 ENCOUNTER — Ambulatory Visit: Payer: Medicare Other | Admitting: Family Medicine

## 2019-05-01 ENCOUNTER — Ambulatory Visit: Payer: Self-pay | Admitting: Family Medicine

## 2019-06-03 ENCOUNTER — Other Ambulatory Visit: Payer: Self-pay

## 2019-06-03 ENCOUNTER — Ambulatory Visit (INDEPENDENT_AMBULATORY_CARE_PROVIDER_SITE_OTHER): Payer: Medicare Other | Admitting: Family Medicine

## 2019-06-03 ENCOUNTER — Encounter: Payer: Self-pay | Admitting: Family Medicine

## 2019-06-03 VITALS — BP 130/74 | HR 81 | Temp 98.3°F | Wt 259.5 lb

## 2019-06-03 DIAGNOSIS — G4730 Sleep apnea, unspecified: Secondary | ICD-10-CM

## 2019-06-03 DIAGNOSIS — I10 Essential (primary) hypertension: Secondary | ICD-10-CM

## 2019-06-03 DIAGNOSIS — E785 Hyperlipidemia, unspecified: Secondary | ICD-10-CM

## 2019-06-03 DIAGNOSIS — M818 Other osteoporosis without current pathological fracture: Secondary | ICD-10-CM

## 2019-06-03 DIAGNOSIS — T387X5A Adverse effect of androgens and anabolic congeners, initial encounter: Secondary | ICD-10-CM

## 2019-06-03 DIAGNOSIS — E1169 Type 2 diabetes mellitus with other specified complication: Secondary | ICD-10-CM

## 2019-06-03 DIAGNOSIS — E118 Type 2 diabetes mellitus with unspecified complications: Secondary | ICD-10-CM

## 2019-06-03 DIAGNOSIS — I152 Hypertension secondary to endocrine disorders: Secondary | ICD-10-CM

## 2019-06-03 DIAGNOSIS — E1159 Type 2 diabetes mellitus with other circulatory complications: Secondary | ICD-10-CM

## 2019-06-03 DIAGNOSIS — D692 Other nonthrombocytopenic purpura: Secondary | ICD-10-CM

## 2019-06-03 DIAGNOSIS — Z8546 Personal history of malignant neoplasm of prostate: Secondary | ICD-10-CM | POA: Diagnosis not present

## 2019-06-03 DIAGNOSIS — Z1159 Encounter for screening for other viral diseases: Secondary | ICD-10-CM | POA: Diagnosis not present

## 2019-06-03 DIAGNOSIS — R319 Hematuria, unspecified: Secondary | ICD-10-CM

## 2019-06-03 LAB — POCT GLYCOSYLATED HEMOGLOBIN (HGB A1C): Hemoglobin A1C: 7.2 % — AB (ref 4.0–5.6)

## 2019-06-03 LAB — LIPID PANEL

## 2019-06-03 MED ORDER — METFORMIN HCL 1000 MG PO TABS: ORAL_TABLET | ORAL | 1 refills | Status: DC

## 2019-06-03 MED ORDER — LIRAGLUTIDE 18 MG/3ML ~~LOC~~ SOPN: 1.8000 mg | PEN_INJECTOR | Freq: Every day | SUBCUTANEOUS | 1 refills | Status: DC

## 2019-06-03 MED ORDER — PITAVASTATIN CALCIUM 4 MG PO TABS: 1.0000 | ORAL_TABLET | Freq: Every day | ORAL | 3 refills | Status: DC

## 2019-06-03 MED ORDER — LOSARTAN POTASSIUM 100 MG PO TABS: 100.0000 mg | ORAL_TABLET | Freq: Every day | ORAL | 3 refills | Status: DC

## 2019-06-03 NOTE — Progress Notes (Signed)
Jimmy [name] is a 73 y.o. male who presents for annual wellness visit and follow-up on chronic medical conditions.  He has recently had difficulty with hematuria and is seeing his urologist for this.  Presently he is taking Macrodantin for this.  He does have a previous history of prostate cancer.  He continues on Victoza as well as metformin for his diabetes.  He does check his blood sugars periodically.  He also continues on losartan and labetalol.  Having no difficulty with any of them.  His sleep apnea does seem to be under good control.  His weight is recorded.  He also has a previous history of osteoporosis and did get injections for this however at this time he is off the medication.  He does have OSA and seems to be doing well on that.   Immunizations and Health Maintenance Immunization History  Administered Date(s) Administered  . DTaP 04/04/2005  . Influenza Split 09/16/2012  . Influenza Whole 10/08/2007, 11/27/2010, 08/29/2011  . Influenza, High Dose Seasonal PF 10/06/2013, 11/10/2014, 09/14/2015, 09/06/2016, 08/30/2017, 11/03/2018  . Pneumococcal Conjugate-13 03/20/2010  . Pneumococcal Polysaccharide-23 10/05/2005, 03/20/2010  . Tdap 11/30/2015  . Zoster 03/20/2010  . Zoster Recombinat (Shingrix) 02/27/2017, 08/29/2017   Health Maintenance Due  Topic Date Due  . Hepatitis C Screening  [birth year]  . PNA vac Low Risk Adult (2 of 2 - PPSV23) 05/07/2011  . FOOT EXAM  01/07/2015  . OPHTHALMOLOGY EXAM  01/30/2019    Last colonoscopy:02-01-2015 Last PSA: unknown Dentist:03/2019 Ophtho:11/2018 Exercise:walking and staying active  Other doctors caring for patient include:Dr. Skains cardio, Dr. Melvyn Novas pulmon. , Dr. Robet Leu.  Advanced Directives:No . Info given Does Patient Have a Medical Advance Directive?: No Would patient like information on creating a medical advance directive?: Yes (MAU/Ambulatory/Procedural Areas - Information given)  Depression screen:  See  questionnaire below.     Depression screen [facility] 2/9 06/03/2019 08/04/2018 03/27/2017 11/30/2015 09/10/2014  Decreased Interest 0 0 0 0 0  Down, Depressed, Hopeless 0 0 0 0 0  PHQ - 2 Score 0 0 0 0 0    Fall Screen: See Questionaire below.   Fall Risk  06/03/2019 08/04/2018 03/27/2017 11/30/2015 09/10/2014  Falls in the past year? 0 No No No No    ADL screen:  See questionnaire below.  Functional Status Survey: Is the patient deaf or have difficulty hearing?: Yes Does the patient have difficulty seeing, even when wearing glasses/contacts?: No Does the patient have difficulty concentrating, remembering, or making decisions?: No Does the patient have difficulty walking or climbing stairs?: No Does the patient have difficulty dressing or bathing?: No Does the patient have difficulty doing errands alone such as visiting a doctor's office or shopping?: No   Review of Systems  Constitutional: -, -unexpected weight change, -anorexia, -fatigue  Dermatology: denies changing moles, rash, lumps ENT: -runny nose, -ear pain, -sore throat,  Cardiology:  -chest pain, -palpitations, -orthopnea, Respiratory: -cough, -shortness of breath, -dyspnea on exertion, -wheezing,  Gastroenterology: -abdominal pain, -nausea, -vomiting, -diarrhea, -constipation, -dysphagia Hematology: -bleeding or bruising problems Musculoskeletal: -arthralgias, -myalgias, -joint swelling, -back pain, - Ophthalmology: -vision changes,  Urology: -dysuria, -difficulty urinating,  -urinary frequency, -urgency, incontinence Neurology: -, -numbness, , -memory loss, -falls, -dizziness    PHYSICAL EXAM:  BP 130/74 (BP Location: Left Arm, Patient Position: Sitting)   Pulse 81   Temp 98.3 F (36.8 C)   Wt 259 lb 8 oz (117.7 kg)   SpO2 95%   BMI 40.64 kg/m   General  Appearance: Alert, cooperative, no distress, appears stated age Head: Normocephalic, without obvious abnormality, atraumatic Eyes: PERRL, conjunctiva/corneas clear,  EOM's intact, fundi benign Ears: Normal TM's and external ear canals Nose: Nares normal, mucosa normal, no drainage or sinus   tenderness Throat: Lips, mucosa, and tongue normal; teeth and gums normal Neck: Supple, no lymphadenopathy, thyroid:no enlargement/tenderness/nodules; no carotid bruit or JVD Lungs: Clear to auscultation bilaterally without wheezes, rales or ronchi; respirations unlabored Heart: Regular rate and rhythm, S1 and S2 normal, no murmur, rub or gallop Abdomen: Soft, non-tender, nondistended, normoactive bowel sounds, no masses, no hepatosplenomegaly Extremities: No clubbing, cyanosis or edema Pulses: 2+ and symmetric all extremities Skin: Purpuric lesion noted on right forearm. Lymph nodes: Cervical, supraclavicular, and axillary nodes normal Neurologic: CNII-XII intact, normal strength, sensation and gait; reflexes 2+ and symmetric throughout   Psych: Normal mood, affect, hygiene and grooming Urine was dark red  Hemoglobin A1c of 7.2 ASSESSMENT/PLAN: Type II diabetes mellitus with complication (HCC) - Plan: CBC with Differential/Platelet, Comprehensive metabolic panel, Lipid panel, POCT UA - Microalbumin, POCT glycosylated hemoglobin (Hb A1C), metFORMIN (GLUCOPHAGE) 1000 MG tablet, liraglutide (VICTOZA) 18 MG/3ML SOPN  History of prostate cancer  Need for hepatitis C screening test - Plan: Hepatitis C antibody  Hyperlipidemia associated with type 2 diabetes mellitus ([place]) - Plan: Lipid panel, Pitavastatin Calcium (LIVALO) 4 MG TABS  Hypertension associated with type 2 diabetes mellitus ([place]) - Plan: CBC with Differential/Platelet, Comprehensive metabolic panel, losartan (COZAAR) 100 MG tablet  Morbid obesity (HCC)  Sleep apnea, unspecified type  Osteoporosis due to androgen therapy  Hematuria, unspecified type  Senile purpura ([name]) He will follow-up with urology concerning his hematuria.  He will continue on his present medication regimen.  Discussed of senile  purpura with him and let him know there is nothing of major concern.  Did discuss the possibility of switching him to Vascepa. , recommended at least 30 minutes of aerobic activity at least 5 days/week; proper sunscreen use reviewed; healthy diet and alcohol recommendations (less than or equal to 2 drinks/day) reviewed; regular seatbelt use; changing batteries in smoke detectors. Immunization recommendations discussed.  Colonoscopy recommendations reviewed.   Medicare Attestation I have personally reviewed: The patient's medical and social history Their use of alcohol, tobacco or illicit drugs Their current medications and supplements The patient's functional ability including ADLs,fall risks, home safety risks, cognitive, and hearing and visual impairment Diet and physical activities Evidence for depression or mood disorders  The patient's weight, height, and BMI have been recorded in the chart.  I have made referrals, counseling, and provided education to the patient based on review of the above and I have provided the patient with a written personalized care plan for preventive services.     Jill Alexanders, MD   06/03/2019

## 2019-06-04 ENCOUNTER — Telehealth: Payer: Self-pay | Admitting: Family Medicine

## 2019-06-04 LAB — COMPREHENSIVE METABOLIC PANEL
ALT: 47 IU/L — ABNORMAL HIGH (ref 0–44)
AST: 52 IU/L — ABNORMAL HIGH (ref 0–40)
Albumin/Globulin Ratio: 1.7 (ref 1.2–2.2)
Albumin: 4.7 g/dL (ref 3.7–4.7)
Alkaline Phosphatase: 59 IU/L (ref 39–117)
BUN/Creatinine Ratio: 17 (ref 10–24)
BUN: 19 mg/dL (ref 8–27)
Bilirubin Total: 0.3 mg/dL (ref 0.0–1.2)
CO2: 19 mmol/L — ABNORMAL LOW (ref 20–29)
Calcium: 9.9 mg/dL (ref 8.6–10.2)
Chloride: 102 mmol/L (ref 96–106)
Creatinine, Ser: 1.12 mg/dL (ref 0.76–1.27)
GFR calc Af Amer: 75 mL/min/{1.73_m2} (ref >59)
GFR calc non Af Amer: 65 mL/min/{1.73_m2} (ref >59)
Globulin, Total: 2.8 g/dL (ref 1.5–4.5)
Glucose: 134 mg/dL — ABNORMAL HIGH (ref 65–99)
Potassium: 4.9 mmol/L (ref 3.5–5.2)
Sodium: 141 mmol/L (ref 134–144)
Total Protein: 7.5 g/dL (ref 6.0–8.5)

## 2019-06-04 LAB — CBC WITH DIFFERENTIAL/PLATELET
Basophils Absolute: 0 10*3/uL (ref 0.0–0.2)
Basos: 1 %
EOS (ABSOLUTE): 0.2 10*3/uL (ref 0.0–0.4)
Eos: 3 %
Hematocrit: 38.6 % (ref 37.5–51.0)
Hemoglobin: 12.7 g/dL — ABNORMAL LOW (ref 13.0–17.7)
Immature Grans (Abs): 0 10*3/uL (ref 0.0–0.1)
Immature Granulocytes: 0 %
Lymphocytes Absolute: 1 10*3/uL (ref 0.7–3.1)
Lymphs: 15 %
MCH: 29.9 pg (ref 26.6–33.0)
MCHC: 32.9 g/dL (ref 31.5–35.7)
MCV: 91 fL (ref 79–97)
Monocytes Absolute: 0.6 10*3/uL (ref 0.1–0.9)
Monocytes: 9 %
Neutrophils Absolute: 4.7 10*3/uL (ref 1.4–7.0)
Neutrophils: 72 %
Platelets: 147 10*3/uL — ABNORMAL LOW (ref 150–450)
RBC: 4.25 x10E6/uL (ref 4.14–5.80)
RDW: 14.1 % (ref 11.6–15.4)
WBC: 6.4 10*3/uL (ref 3.4–10.8)

## 2019-06-04 LAB — HEPATITIS C ANTIBODY: Hep C Virus Ab: <0.1 s/co ratio (ref 0.0–0.9)

## 2019-06-04 LAB — LIPID PANEL
Chol/HDL Ratio: 4.3 ratio (ref 0.0–5.0)
Cholesterol, Total: 178 mg/dL (ref 100–199)
HDL: 41 mg/dL (ref >39)
LDL Calculated: 66 mg/dL (ref 0–99)
Triglycerides: 355 mg/dL — ABNORMAL HIGH (ref 0–149)
VLDL Cholesterol Cal: 71 mg/dL — ABNORMAL HIGH (ref 5–40)

## 2019-06-04 MED ORDER — ICOSAPENT ETHYL 1 G PO CAPS: 2.0000 | ORAL_CAPSULE | Freq: Two times a day (BID) | ORAL | 3 refills | Status: DC

## 2019-06-04 NOTE — Addendum Note (Signed)
Addended by: [name] on: 06/04/2019 01:12 PM   Modules accepted: Orders

## 2019-06-04 NOTE — Telephone Encounter (Signed)
  Kim  Can you please call [name], she has questions about Ed's visit yesterday and his meds  His AVS says change how you take Livalo  But he is taking 4mg  once a day  Looks like 4 meds were sent in MEtformin, Livalo, losartan and metformin Is he still supposed to be taking Omega 3 ( that rx was not sent in)  Also on meds list  His macrodantin 100mg  he is taking BID  If you could call her please, I tried to help her but couldn't answer the med questions and why the AVS is looking like livalvo changed

## 2019-06-04 NOTE — Telephone Encounter (Signed)
Pt wife was called and advised. [place]

## 2019-06-17 ENCOUNTER — Other Ambulatory Visit: Payer: Self-pay | Admitting: Urology

## 2019-06-18 ENCOUNTER — Other Ambulatory Visit (HOSPITAL_COMMUNITY)
Admission: RE | Admit: 2019-06-18 | Discharge: 2019-06-18 | Disposition: A | Discharge status: Home / Self Care | Payer: Medicare Other | Source: Ambulatory Visit | Attending: Urology | Admitting: Urology

## 2019-06-18 DIAGNOSIS — Z1159 Encounter for screening for other viral diseases: Secondary | ICD-10-CM | POA: Insufficient documentation

## 2019-06-18 LAB — SARS CORONAVIRUS 2 (TAT 6-24 HRS): SARS Coronavirus 2: NEGATIVE

## 2019-06-18 NOTE — Progress Notes (Signed)
HEMAGLOBIN A1C 06-03-19 Epic (7.2) LOV CARDIOLOGY DR Marlou Porch 12-15-18 Epic STRESS TEST 01-08-18 Epic ECHO 12-19-17 Epic CHEST CT 01-06-19 EPIC

## 2019-06-18 NOTE — Patient Instructions (Addendum)
[name]    Your procedure is scheduled on: 06-22-2019  Report to [facility] Main  Entrance  Report to admitting at 335 PM   YOU NEED TO HAVE A COVID 19 TEST ON_______ @_______ , THIS TEST MUST BE DONE BEFORE SURGERY, COME TO [place]. ONCE YOUR COVID TEST IS COMPLETED, PLEASE BEGIN THE QUARANTINE INSTRUCTIONS AS OUTLINED IN YOUR HANDOUT.  BRING CPAP MASK AND TUBING   Call this number if you have problems the morning of surgery [phone]    Remember: Do not eat food :After Midnight. CLEAR LIQUIDS FROM MIDNIGHT UNTIL 1135 AM BRUSH YOUR TEETH MORNING OF SURGERY AND RINSE YOUR MOUTH OUT, NO CHEWING GUM CANDY OR MINTS.     CLEAR LIQUID DIET   Foods Allowed                                                                     Foods Excluded  Coffee and tea, regular and decaf                             liquids that you cannot  Plain Jell-O in any flavor                                             see through such as: Fruit ices (not with fruit pulp)                                     milk, soups, orange juice  Iced Popsicles                                    All solid food Carbonated beverages, regular and diet                                    Cranberry, grape and apple juices Sports drinks like Gatorade Lightly seasoned clear broth or consume(fat free) Sugar, honey syrup  Sample Menu Breakfast                                Lunch                                     Supper Cranberry juice                    Beef broth                            Chicken broth Jell-O  Grape juice                           Apple juice Coffee or tea                        Jell-O                                      Popsicle                                                Coffee or tea                        Coffee or  tea  _____________________________________________________________________     Take these medicines the morning of surgery with A SIP OF WATER: VISCEPA, LIVALO  DO NOT TAKE ANY DIABETIC MEDICATIONS DAY OF YOUR SURGERY         How to Manage Your Diabetes Before and After Surgery  Why is it important to control my blood sugar before and after surgery? . Improving blood sugar levels before and after surgery helps healing and can limit problems. . A way of improving blood sugar control is eating a healthy diet by: o  Eating less sugar and carbohydrates o  Increasing activity/exercise o  Talking with your doctor about reaching your blood sugar goals . High blood sugars (greater than 180 mg/dL) can raise your risk of infections and slow your recovery, so you will need to focus on controlling your diabetes during the weeks before surgery. . Make sure that the doctor who takes care of your diabetes knows about your planned surgery including the date and location.  How do I manage my blood sugar before surgery? . Check your blood sugar at least 4 times a day, starting 2 days before surgery, to make sure that the level is not too high or low. o Check your blood sugar the morning of your surgery when you wake up and every 2 hours until you get to the Short Stay unit. . If your blood sugar is less than 70 mg/dL, you will need to treat for low blood sugar: o Do not take insulin. o Treat a low blood sugar (less than 70 mg/dL) with  cup of clear juice (cranberry or apple), 4 glucose tablets, OR glucose gel. o Recheck blood sugar in 15 minutes after treatment (to make sure it is greater than 70 mg/dL). If your blood sugar is not greater than 70 mg/dL on recheck, call [phone] for further instructions. . Report your blood sugar to the short stay nurse when you get to Short Stay.  . If you are admitted to the hospital after surgery: o Your blood sugar will be checked by the staff and you will  probably be given insulin after surgery (instead of oral diabetes medicines) to make sure you have good blood sugar levels. o The goal for blood sugar control after surgery is 80-180 mg/dL.   WHAT DO I DO ABOUT MY DIABETES MEDICATION?  [name] Do not take oral diabetes medicines (pills) the morning of surgery.  . THE DAY BEFORE SURGERY TAKE VICTOZA AND METFORMIN AS USUAL. [name]  THE MORNING OF SURGERY DO NO TAKE VICTOZA OR METFORMIN.      Reviewed and Endorsed by [facility] Patient Education Committee, August 2015                        You may not have any metal on your body including hair pins and              piercings  Do not wear jewelry, make-up, lotions, powders or perfumes, deodorant                          Men may shave face and neck.   Do not bring valuables to the hospital. [place].  Contacts, dentures or bridgework may not be worn into surgery.  Leave suitcase in the car. After surgery it may be brought to your room.     Patients discharged the day of surgery will not be allowed to drive home. IF YOU ARE HAVING SURGERY AND GOING HOME THE SAME DAY, YOU MUST HAVE AN ADULT TO DRIVE YOU HOME AND BE WITH YOU FOR 24 HOURS. YOU MAY GO HOME BY TAXI OR UBER OR ORTHERWISE, BUT AN ADULT MUST ACCOMPANY YOU HOME AND STAY WITH YOU FOR 24 HOURS.  Name and phone number of your driver:  Special Instructions: N/A              Please read over the following fact sheets you were given: _____________________________________________________________________             [facility] - Preparing for Surgery Before surgery, you can play an important role.  Because skin is not sterile, your skin needs to be as free of germs as possible.  You can reduce the number of germs on your skin by washing with CHG (chlorahexidine gluconate) soap before surgery.  CHG is an antiseptic cleaner which kills germs and bonds with the skin to continue killing  germs even after washing. Please DO NOT use if you have an allergy to CHG or antibacterial soaps.  If your skin becomes reddened/irritated stop using the CHG and inform your nurse when you arrive at Short Stay. Do not shave (including legs and underarms) for at least 48 hours prior to the first CHG shower.  You may shave your face/neck. Please follow these instructions carefully:  1.  Shower with CHG Soap the night before surgery and the  morning of Surgery.  2.  If you choose to wash your hair, wash your hair first as usual with your  normal  shampoo.  3.  After you shampoo, rinse your hair and body thoroughly to remove the  shampoo.                           4.  Use CHG as you would any other liquid soap.  You can apply chg directly  to the skin and wash                       Gently with a scrungie or clean washcloth.  5.  Apply the CHG Soap to your body ONLY FROM THE NECK DOWN.   Do not use on face/ open  Wound or open sores. Avoid contact with eyes, ears mouth and genitals (private parts).                       Wash face,  Genitals (private parts) with your normal soap.             6.  Wash thoroughly, paying special attention to the area where your surgery  will be performed.  7.  Thoroughly rinse your body with warm water from the neck down.  8.  DO NOT shower/wash with your normal soap after using and rinsing off  the CHG Soap.                9.  Pat yourself dry with a clean towel.            10.  Wear clean pajamas.            11.  Place clean sheets on your bed the night of your first shower and do not  sleep with pets. Day of Surgery : Do not apply any lotions/deodorants the morning of surgery.  Please wear clean clothes to the hospital/surgery center.  FAILURE TO FOLLOW THESE INSTRUCTIONS MAY RESULT IN THE CANCELLATION OF YOUR SURGERY PATIENT SIGNATURE_________________________________  NURSE  SIGNATURE__________________________________  ________________________________________________________________________

## 2019-06-19 ENCOUNTER — Encounter (HOSPITAL_COMMUNITY)
Admission: RE | Admit: 2019-06-19 | Discharge: 2019-06-19 | Disposition: A | Discharge status: Home / Self Care | Payer: Medicare Other | Source: Ambulatory Visit | Attending: Urology | Admitting: Urology

## 2019-06-19 ENCOUNTER — Other Ambulatory Visit: Payer: Self-pay

## 2019-06-19 ENCOUNTER — Encounter (HOSPITAL_COMMUNITY): Payer: Self-pay

## 2019-06-19 DIAGNOSIS — Z01818 Encounter for other preprocedural examination: Secondary | ICD-10-CM | POA: Diagnosis present

## 2019-06-19 HISTORY — DX: Type 2 diabetes mellitus without complications: E11.9

## 2019-06-19 HISTORY — DX: Personal history of urinary calculi: Z87.442

## 2019-06-19 HISTORY — DX: Atherosclerotic heart disease of native coronary artery without angina pectoris: I25.10

## 2019-06-19 LAB — BASIC METABOLIC PANEL
Anion gap: 9 (ref 5–15)
BUN: 22 mg/dL (ref 8–23)
CO2: 23 mmol/L (ref 22–32)
Calcium: 9.8 mg/dL (ref 8.9–10.3)
Chloride: 105 mmol/L (ref 98–111)
Creatinine, Ser: 1.56 mg/dL — ABNORMAL HIGH (ref 0.61–1.24)
GFR calc Af Amer: 50 mL/min — ABNORMAL LOW (ref >60)
GFR calc non Af Amer: 43 mL/min — ABNORMAL LOW (ref >60)
Glucose, Bld: 161 mg/dL — ABNORMAL HIGH (ref 70–99)
Potassium: 5.4 mmol/L — ABNORMAL HIGH (ref 3.5–5.1)
Sodium: 137 mmol/L (ref 135–145)

## 2019-06-19 LAB — CBC
HCT: 38.7 % — ABNORMAL LOW (ref 39.0–52.0)
Hemoglobin: 11.8 g/dL — ABNORMAL LOW (ref 13.0–17.0)
MCH: 29.3 pg (ref 26.0–34.0)
MCHC: 30.5 g/dL (ref 30.0–36.0)
MCV: 96 fL (ref 80.0–100.0)
Platelets: 165 10*3/uL (ref 150–400)
RBC: 4.03 MIL/uL — ABNORMAL LOW (ref 4.22–5.81)
RDW: 14.6 % (ref 11.5–15.5)
WBC: 5.6 10*3/uL (ref 4.0–10.5)
nRBC: 0 % (ref 0.0–0.2)

## 2019-06-19 LAB — GLUCOSE, CAPILLARY: Glucose-Capillary: 188 mg/dL — ABNORMAL HIGH (ref 70–99)

## 2019-06-19 NOTE — Progress Notes (Signed)
Anesthesia Chart Review   Case: [id number] Date/Time: 06/22/19 1715   Procedure: TRANSURETHRAL RESECTION OF BLADDER TUMOR (TURBT), CYSTOSCOPY, POSSIBLE BILATERAL RETROGRADE PYELOGRAPHY (N/A ) - GENERAL WITH PARALYSIS   Anesthesia type: General   Pre-op diagnosis: BLADDER TUMOR   Location: [facility]   Surgeon: Raynelle Bring, MD      DISCUSSION: 73 yo former smoker (40 pack years, quit 08/20/2019) with h/o nonobstructive CAD, sleep apnea with CPAP, DM II, prostate cancer, bladder tumor scheduled for above procedure 06/22/2019 with Dr. Raynelle Bring.   Last seen by cardiologist, Dr. Candee Furbish, 12/15/18.  Stable at this visit. Low risk stress test 12/2017. 1 year follow up recommended.    Pt can proceed with planned procedure barring acute status change.   VS: BP (!) 147/85   Pulse 87   Temp 36.6 C (Oral)   Resp 16   Ht _0  (1.727 m)   Wt 118.7 kg   SpO2 97%   BMI 39.78 kg/m   PROVIDERS: [name], MD is PCP  Candee Furbish, MD is Cardiologist  LABS: Labs reviewed: Acceptable for surgery. (all labs ordered are listed, but only abnormal results are displayed)  Labs Reviewed  BASIC METABOLIC PANEL - Abnormal; Notable for the following components:      Result Value   Potassium 5.4 (*)    Glucose, Bld 161 (*)    Creatinine, Ser 1.56 (*)    GFR calc non Af Amer 43 (*)    GFR calc Af Amer 50 (*)    All other components within normal limits  CBC - Abnormal; Notable for the following components:   RBC 4.03 (*)    Hemoglobin 11.8 (*)    HCT 38.7 (*)    All other components within normal limits  GLUCOSE, CAPILLARY - Abnormal; Notable for the following components:   Glucose-Capillary 188 (*)    All other components within normal limits     IMAGES: CT Chest 01/06/2019 IMPRESSION: 1. Stable 4 mm peripheral left upper lobe pulmonary nodule. This is consistent with benign etiology. No further imaging follow-up recommended. This recommendation follows the  consensus statement: Guidelines for Management of Incidental Pulmonary Nodules Detected on CT Images: From the Fleischner Society 2017; Radiology 2017; 284:228-243. 2. Cirrhosis. 3. Emphysema (ICD10-J43.9). 4.  Aortic Atherosclerois (ICD10-170.0)  EKG: 06/19/2019 Rate 81 bpm Sinus rhythm with 1st degree AV block  Otherwise normal ECG   CV: Echo 12/20/2019 Study Conclusions  - Left ventricle: The cavity size was normal. Wall thickness was   normal. Systolic function was normal. The estimated ejection   fraction was in the range of 55% to 60%. Wall motion was normal;   there were no regional wall motion abnormalities. Doppler   parameters are consistent with abnormal left ventricular   relaxation (grade 1 diastolic dysfunction). - Left atrium: The atrium was mildly dilated. - Atrial septum: No defect or patent foramen ovale was identified.  Myocardial Perfusion 01/08/2018  Patient walked on a treadmill test for 4 minutes and 15 seconds. He achieved a peak heart rate of 146 which is 98% of predicted maximal heart rate.  There were no ST or T wave changes to suggest ischemia.  Blood pressure response to exercise was normal.  Nuclear stress EF: 71%. The left ventricular ejection fraction is hyperdynamic (>65%).  The study is normal. This is a low risk study.    Past Medical History:  Diagnosis Date  . Astigmatism of both eyes 11/01/2016  .  Bronchitis   . Coronary artery disease   . Cortical age-related cataract of both eyes 11/01/2016  . Diabetes mellitus without complication ([place])    type 2  . Drug-induced osteoporosis   . Dyslipidemia   . Fatigue   . Fatty liver   . History of kidney stones   . Hypercholesteremia   . Hypogonadism male   . 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 Normal nuclear stress test March 2011  . Nuclear sclerotic cataract of both eyes 11/01/2016  .  Obesity   . Prostate cancer [facility])    s/p seed implant and radiation  . Sleep apnea    90% of time dosent wear his cpap per pt  . Vitamin D insufficiency     Past Surgical History:  Procedure Laterality Date  . CARDIAC CATHETERIZATION  April 2000  . RADIOACTIVE SEED IMPLANT  2009   Implanted radiation seeds  . thumb surgery     as a teen caught thumb in machine    MEDICATIONS: . ASPIRIN PO  . B-D UF III MINI PEN NEEDLES 31G X 5 MM MISC  . blood glucose meter kit and supplies  . cholecalciferol (VITAMIN D) 1000 UNITS tablet  . Coenzyme Q10 (COQ10 PO)  . Icosapent Ethyl (VASCEPA) 1 g CAPS  . liraglutide (VICTOZA) 18 MG/3ML SOPN  . losartan (COZAAR) 100 MG tablet  . Magnesium 250 MG TABS  . metFORMIN (GLUCOPHAGE) 1000 MG tablet  . Multiple Vitamins-Minerals (MULTIVITAMIN WITH MINERALS) tablet  . ONE TOUCH ULTRA TEST test strip  . ONETOUCH DELICA LANCETS 24J MISC  . ONETOUCH DELICA LANCETS 75F MISC  . Pitavastatin Calcium (LIVALO) 4 MG TABS   No current facility-administered medications for this encounter.     Maia Plan [facility] Pre-Surgical Testing [phone] 06/19/19 2:37 PM

## 2019-06-19 NOTE — H&P (Signed)
Office Visit Report     06/17/2019   --------------------------------------------------------------------------------   Jimmy [name]  MRN: [id number]  PRIMARY CARE:  [name], MD  DOB: 1946/08/06, 73 year old Male  REFERRING:  Luvenia Redden  SSN: [id number]  PROVIDER:  Raynelle Bring, M.D.    LOCATION:  [facility] [phone]   --------------------------------------------------------------------------------   CC/HPI: Hematuria   Mr. channel in presents with persistent gross hematuria. He was last seen approximately 2 weeks ago with a similar complaint. His urine did appear consistent with urinary tract infection although his cultures have been negative. He was treated empirically with antibiotics with no improvement. He returns today after completing a 3 day course of antibiotics in preparation undergo cystoscopy for further evaluation. He has continued to have gross hematuria without clots. He denies any flank pain or fever. He does have a history of prior tobacco use and a history radiation therapy due to prostate cancer.     ALLERGIES: ACE Inhibitors atorvastatin Cephalexin clopidogrel Doxycycline Plavix TABS rosuvastatin    MEDICATIONS: Bactrim Ds 800 mg-160 mg tablet 1 tablet PO BID  Aspirin 81 MG TABS Oral  HydroCHLOROthiazide 25 MG Oral Tablet Oral  Irbesartan  Livalo 4 mg tablet Oral  Metformin Hcl 1,000 mg tablet 0 Oral  Multiple Daily W/Beta tablet Oral  Victoza 3-Pak 0.6 mg/0.1 ml (18 mg/3 ml) pen injector 0 Subcutaneous  Vitamin D 2000 UNIT Oral Tablet Oral     GU PSH: Cystoscopy - 2017 Locm 300-399Mg /Ml Iodine,1Ml - 2017 TRANSPERI NEEDLE PLACE, PROS - 2009      PSH Notes: Surgery Prostate Transperineal Placement Of Needles   NON-GU PSH: None   GU PMH: Weak Urinary Stream - 05/29/2019 Renal calculus - 05/28/2018 Gross hematuria - 2017 BPH w/LUTS, Benign prostatic hyperplasia (BPH) with straining on urination -  2017 Prostate Cancer, Prostate cancer - 2017 History of prostate cancer, Prostate Cancer - 2014 Overactive bladder, Overactive bladder - 2014 Primary hypogonadism, Hypogonadism, testicular - 2014      PMH Notes:   1) Prostate cancer: He is s/p treatment with radiation therapy/androgen deprivation. He began 5 weeks of external beam radiation in January 2009 and had a radiation seed implant on March 29, 2008. He completed 2 years of androgen deprivation completed in the fall of 2010.   TNM stage: cT3a N0 M0  Staging studies: CT and bone scan negative at diagnosis  Gleason score: 4+4=8  Presenting PSA: 5.35   2) Testosterone deficiency/osteoporosis: He began androgen deprivation with Lupron in November 2008. He was diagnosed with osteoporosis in 2012. He was treated with Prolia for 2 years and his bone density improved. He stopped Prolia in May 2014.   Current treatment: Vitamin D and Calcium supplementation  Last Dexascan: May 2014 - osteopenia   3) BPH/LUTS: Baseline symptoms include hesitancy, frequency, urgency, and urge incontinence. These symptoms worsened after radiation therapy but subsequently improved again.   Current treatment: None  Prior treatment: Tamsulosin, Detrol (able to successfully discontinue)   4) Hematuria: He developed painless gross hematuria in June 2017. He has a history of radiation therapy for prostate cancer. Former smoker.   Jun/Jul 2017: CT - right renal calculi, Cysto - Negative except for friable prostate   5) Urolithiasis: He passed a kidney stone in 2019. He has known non-obstructing right renal calculi.   NON-GU PMH: Diabetes Type 2 Hypercholesterolemia Osteoporosis Sleep Apnea    FAMILY HISTORY: Chronic Obstructive Pulmonary Disease - Father Renal Cell  Carcinoma - Runs In Family   SOCIAL HISTORY: Marital Status: Married Preferred Language: English; Ethnicity: Not Hispanic Or Latino; Race: White Current Smoking Status: Patient does not  smoke anymore.  Has never drank.  Does not drink caffeine.     Notes: Former smoker, Tobacco Use, Occupation:, Marital History - Currently Married   REVIEW OF SYSTEMS:    GU Review Male:   Patient denies frequent urination, hard to postpone urination, burning/ pain with urination, get up at night to urinate, leakage of urine, stream starts and stops, trouble starting your streams, and have to strain to urinate .  Gastrointestinal (Upper):   Patient denies nausea and vomiting.  Gastrointestinal (Lower):   Patient denies diarrhea and constipation.  Constitutional:   Patient denies fever, night sweats, weight loss, and fatigue.  Skin:   Patient denies skin rash/ lesion and itching.  Eyes:   Patient denies blurred vision and double vision.  Ears/ Nose/ Throat:   Patient denies sore throat and sinus problems.  Hematologic/Lymphatic:   Patient denies swollen glands and easy bruising.  Cardiovascular:   Patient denies leg swelling and chest pains.  Respiratory:   Patient denies cough and shortness of breath.  Endocrine:   Patient denies excessive thirst.  Musculoskeletal:   Patient denies back pain and joint pain.  Neurological:   Patient denies headaches and dizziness.  Psychologic:   Patient denies depression and anxiety.   VITAL SIGNS:      06/17/2019 08:42 AM  Weight 260 lb / 117.93 kg  Height 67.5 in / 171.45 cm  BP 120/73 mmHg  Pulse 95 /min  BMI 40.1 kg/m   GU PHYSICAL EXAMINATION:    Urethral Meatus: Normal size. No lesion, no wart, no discharge, no polyp. Normal location.   MULTI-SYSTEM PHYSICAL EXAMINATION:    Constitutional: Well-nourished. No physical deformities. Normally developed. Good grooming.  Respiratory: No labored breathing, no use of accessory muscles. Clear bilaterally.  Cardiovascular: Normal temperature, normal extremity pulses, no swelling, no varicosities. Regular rate and rhythm.     PAST DATA REVIEWED:  Source Of History:  Patient  Urine Test Review:    Urinalysis   05/22/19 05/21/18 05/15/17 05/10/16 05/28/15 05/15/14 05/05/13 10/31/12  PSA  Total PSA <0.015 ng/mL <0.015 ng/mL < 0.015 ng/dl <0.01  <0.01  <0.01  <0.01  <0.01     10/31/10  Hormones  Testosterone, Total 129.73    Notes:                     Urine has been cultured.   PROCEDURES:         Flexible Cystoscopy - 52000  Indication: Gross hematuria Risks, benefits, and potential complications of the procedure were discussed with the patient including infection, bleeding, voiding discomfort, urinary retention, fever, chills, sepsis, and others. All questions were answered. Informed consent was obtained. Sterile technique and intraurethral analgesia were used.  Meatus:  Normal size. Normal location. Normal condition.  Urethra:  No strictures.  External Sphincter:  Normal.  Verumontanum:  Normal.  Prostate:  Moderate hyperplasia. Non-obstructing.  Bladder Neck:  Non-obstructing.  Ureteral Orifices:  Could not be visualized.  Bladder:  Inspection of the bladder was obscured due to hematuria. However, he did have a frondular mass noted in the midline just within the bladder neck along the trigone. I could not identify the ureteral orifice ease. A bladder washing was obtained for cytology.      Chaperone: AJ The procedure was well-tolerated and without complications. Instructions were  given to call the office immediately if questions or problems.         Urinalysis w/Scope Dipstick Dipstick Cont'd Micro  Color: Red Bilirubin: Neg mg/dL WBC/hpf: 0 - 5/hpf  Appearance: Turbid Ketones: 1+ mg/dL RBC/hpf: >60/hpf  Specific Gravity: 1.025 Blood: 3+ ery/uL Bacteria: Mod (26-50/hpf)  pH: 5.5 Protein: 3+ mg/dL Cystals: NS (Not Seen)  Glucose: Neg mg/dL Urobilinogen: 1.0 mg/dL Casts: NS (Not Seen)    Nitrites: Positive Trichomonas: Not Present    Leukocyte Esterase: 3+ leu/uL Mucous: Not Present      Epithelial Cells: NS (Not Seen)      Yeast: NS (Not Seen)      Sperm: Not Present     Notes: MICROSCOPIC NOT CONCENTRATED    ASSESSMENT:      ICD-10 Details  1 GU:   Gross hematuria - R31.0   2   Bladder tumor/neoplasm - D41.4    PLAN:           Orders Labs Urine Cytology          Schedule X-Rays: 1 Week - C.T. Hematuria With and Without I.V. Contrast - PLEASE TRY TO SCHEDULE FOR THURSDAY OR FRIDAY OF THIS WEEK (CR 1.1 ON 06/03/19)  Return Visit/Planned Activity: Other See Visit Notes             Note: Will call to schedule surgery. Please try to schedule CT for tomorrow or Friday.          Document Letter(s):  Created for Patient: Clinical Summary         Notes:   1. Prostate cancer: He will continue annual surveillance.   2. Gross hematuria/bladder tumor: I have recommended that he proceed with further evaluation including CT imaging. He also will be scheduled to undergo cystoscopy in the operating room with transurethral resection of his bladder tumor and possible bilateral retrograde pyelography. We have reviewed that procedure in detail today including the potential risks, complications, and the expected recovery process. Informed consent was obtained.  He will also undergo attempted left retrograde and stent placement based on CT results below. I discussed the potential benefits and risks of the procedure, side effects of the proposed treatment, the likelihood of the patient achieving the goals of the procedure, and any potential problems that might occur during the procedure or recuperation.   Cc: Dr. Jill Alexanders         Next Appointment:      Next Appointment: 06/18/2019 10:45 AM    Appointment Type: CT Scan    Location: [facility] [phone]    Provider: CT CT    Reason for Visit: CT hematuria w\T\wo / Alinda Money / hematuria      * Signed by Raynelle Bring, M.D. on 06/17/19 at 5:48 PM (EDT)*   CT ABDOMEN AND PELVIS WITHOUT AND WITH CONTRAST 06/18/2019    Jimmy [name]          MRN: [id number]  Ordering Provider: Raynelle Bring, JR   DOB: May 19, 1946  Date Collected: 06/18/2019 10:00:00  SSN:-[id number]  Date Completed: 06/18/2019 16:08:14    CLINICAL DATA: Evaluate bladder mass. Gross hematuria. Bladder  mass.   EXAM:  CT ABDOMEN AND PELVIS WITHOUT AND WITH CONTRAST   TECHNIQUE:  Multidetector CT imaging of the abdomen and pelvis was performed  following the standard protocol before and following the bolus  administration of intravenous contrast.   CONTRAST: 125 cc Omnipaque 300   COMPARISON: 06/15/2016   FINDINGS:  Lower chest: No  acute abnormality.   Hepatobiliary: The liver has a nodular contour and there is  hypertrophy of both caudate and lateral segment of left lobe of  liver. No focal liver abnormality identified. The gallbladder  appears within normal limits.   Pancreas: Unremarkable. No pancreatic ductal dilatation or  surrounding inflammatory changes.   Spleen: The spleen is enlarged measuring 14.3 cm in length.   Adrenals/Urinary Tract: Normal appearance of the adrenal glands. The  right kidney appears normal. 1.2 cm cyst is noted arising from  inferior pole of left kidney. Subcentimeter cyst arises from  inferior pole of right kidney. There is left-sided nephro megaly,  hydronephrosis and edema identified. Hydroureter to the level of the  UVJ is identified. Enhancing mass within the posterior bladder base  obstructs the left UVJ. This measures 5.3 by 2.6 by 3.5 cm (volume =  25 cm^3). Stone within the bladder measures 7 mm. Mild bladder  wall thickening. Several foci of gas are noted within the wall of  the bladder without surrounding inflammation, image 58/9 and image  55/9. Left posterior bladder diverticula noted measuring 1.6 cm.   Stomach/Bowel: Stomach is within normal limits. Appendix appears  normal. No evidence of bowel wall thickening, distention, or  inflammatory changes.   Vascular/Lymphatic: Aortic atherosclerosis. No aneurysm. No  abdominopelvic adenopathy.    Reproductive: Seed implants are identified within the prostate  gland.   Other: No free fluid or fluid collections.   Musculoskeletal: No acute or significant osseous findings.   IMPRESSION:  1. Enhancing posterior bladder mass is identified measuring  approximately 25 cc and obstructs the left UVJ. There is associated  left-sided nephromegaly, hydronephrosis and edema.  2. Bladder stone.  3. Sequelae of chronic bladder outlet obstruction with mild bladder  wall thickening and diverticular formation. There are several foci  of gas identified within the wall of the bladder which is of  uncertain clinical significance. No significant inflammatory changes  involving the bladder identified however.  4. Morphologic features of the liver compatible with cirrhosis.  Stigmata of portal venous hypertension is noted including  splenomegaly.  5. Aortic Atherosclerosis (ICD10-I70.0).    Electronically Signed  By: Kerby Moors M.D.  On: 06/18/2019 16:05

## 2019-06-19 NOTE — Anesthesia Preprocedure Evaluation (Addendum)
Anesthesia Evaluation  Patient identified by MRN, date of birth, ID band Patient awake    Reviewed: Allergy & Precautions, NPO status , Patient's Chart, lab work & pertinent test results  Airway Mallampati: II  TM Distance: >3 FB Neck ROM: Full    Dental no notable dental hx.    Pulmonary sleep apnea , former smoker,    Pulmonary exam normal breath sounds clear to auscultation       Cardiovascular hypertension, Pt. on medications + CAD and + Peripheral Vascular Disease  Normal cardiovascular exam Rhythm:Regular Rate:Normal  ECG: rate 88. Sinus rhythm with 1st degree A-V block  Last seen by cardiologist, Dr. Candee Furbish, 12/15/18.  Stable at this visit. Low risk stress test 12/2017. 1 year follow up recommended   Neuro/Psych negative neurological ROS  negative psych ROS   GI/Hepatic negative GI ROS, Neg liver ROS,   Endo/Other  diabetes, Oral Hypoglycemic AgentsMorbid obesity  Renal/GU Renal disease     Musculoskeletal negative musculoskeletal ROS (+)   Abdominal   Peds  Hematology  (+) anemia , HLD   Anesthesia Other Findings BLADDER TUMOR  Reproductive/Obstetrics                           Anesthesia Physical Anesthesia Plan  ASA: III  Anesthesia Plan: General   Post-op Pain Management:    Induction: Intravenous  PONV Risk Score and Plan: 3 and Ondansetron, Dexamethasone and Treatment may vary due to age or medical condition  Airway Management Planned: Oral ETT  Additional Equipment:   Intra-op Plan:   Post-operative Plan: Extubation in OR  Informed Consent: I have reviewed the patients History and Physical, chart, labs and discussed the procedure including the risks, benefits and alternatives for the proposed anesthesia with the patient or authorized representative who has indicated his/her understanding and acceptance.     Dental advisory given  Plan Discussed with:  CRNA  Anesthesia Plan Comments: (Reviewed PAT note 06/19/2019, Konrad Felix, PA-C)       Anesthesia Quick Evaluation

## 2019-06-22 ENCOUNTER — Telehealth (HOSPITAL_COMMUNITY): Payer: Self-pay | Admitting: *Deleted

## 2019-06-22 ENCOUNTER — Ambulatory Visit (HOSPITAL_COMMUNITY): Payer: Medicare Other | Admitting: Anesthesiology

## 2019-06-22 ENCOUNTER — Encounter (HOSPITAL_COMMUNITY): Payer: Self-pay

## 2019-06-22 ENCOUNTER — Ambulatory Visit (HOSPITAL_COMMUNITY): Payer: Medicare Other | Admitting: Physician Assistant

## 2019-06-22 ENCOUNTER — Ambulatory Visit (HOSPITAL_COMMUNITY)
Admission: RE | Admit: 2019-06-22 | Discharge: 2019-06-22 | Disposition: A | Discharge status: Home / Self Care | Payer: Medicare Other | Attending: Urology | Admitting: Urology

## 2019-06-22 ENCOUNTER — Ambulatory Visit (HOSPITAL_COMMUNITY): Payer: Medicare Other

## 2019-06-22 ENCOUNTER — Other Ambulatory Visit: Payer: Self-pay

## 2019-06-22 ENCOUNTER — Encounter (HOSPITAL_COMMUNITY)
Admission: RE | Disposition: A | Discharge status: Home / Self Care | Payer: Self-pay | Source: Home / Self Care | Attending: Urology

## 2019-06-22 DIAGNOSIS — N131 Hydronephrosis with ureteral stricture, not elsewhere classified: Secondary | ICD-10-CM | POA: Insufficient documentation

## 2019-06-22 DIAGNOSIS — I440 Atrioventricular block, first degree: Secondary | ICD-10-CM | POA: Diagnosis not present

## 2019-06-22 DIAGNOSIS — Z79899 Other long term (current) drug therapy: Secondary | ICD-10-CM | POA: Diagnosis not present

## 2019-06-22 DIAGNOSIS — E785 Hyperlipidemia, unspecified: Secondary | ICD-10-CM | POA: Insufficient documentation

## 2019-06-22 DIAGNOSIS — E1151 Type 2 diabetes mellitus with diabetic peripheral angiopathy without gangrene: Secondary | ICD-10-CM | POA: Diagnosis not present

## 2019-06-22 DIAGNOSIS — Z7982 Long term (current) use of aspirin: Secondary | ICD-10-CM | POA: Insufficient documentation

## 2019-06-22 DIAGNOSIS — I10 Essential (primary) hypertension: Secondary | ICD-10-CM | POA: Insufficient documentation

## 2019-06-22 DIAGNOSIS — Z87891 Personal history of nicotine dependence: Secondary | ICD-10-CM | POA: Diagnosis not present

## 2019-06-22 DIAGNOSIS — Z881 Allergy status to other antibiotic agents status: Secondary | ICD-10-CM | POA: Insufficient documentation

## 2019-06-22 DIAGNOSIS — I700 Atherosclerosis of aorta: Secondary | ICD-10-CM | POA: Diagnosis not present

## 2019-06-22 DIAGNOSIS — R310 Gross hematuria: Secondary | ICD-10-CM | POA: Insufficient documentation

## 2019-06-22 DIAGNOSIS — Z8546 Personal history of malignant neoplasm of prostate: Secondary | ICD-10-CM | POA: Insufficient documentation

## 2019-06-22 DIAGNOSIS — Z923 Personal history of irradiation: Secondary | ICD-10-CM | POA: Insufficient documentation

## 2019-06-22 DIAGNOSIS — C670 Malignant neoplasm of trigone of bladder: Secondary | ICD-10-CM | POA: Insufficient documentation

## 2019-06-22 DIAGNOSIS — Z6839 Body mass index (BMI) 39.0-39.9, adult: Secondary | ICD-10-CM | POA: Insufficient documentation

## 2019-06-22 DIAGNOSIS — I2510 Atherosclerotic heart disease of native coronary artery without angina pectoris: Secondary | ICD-10-CM | POA: Diagnosis not present

## 2019-06-22 DIAGNOSIS — G4730 Sleep apnea, unspecified: Secondary | ICD-10-CM | POA: Insufficient documentation

## 2019-06-22 DIAGNOSIS — Z7984 Long term (current) use of oral hypoglycemic drugs: Secondary | ICD-10-CM | POA: Diagnosis not present

## 2019-06-22 DIAGNOSIS — D649 Anemia, unspecified: Secondary | ICD-10-CM | POA: Insufficient documentation

## 2019-06-22 DIAGNOSIS — I739 Peripheral vascular disease, unspecified: Secondary | ICD-10-CM | POA: Diagnosis not present

## 2019-06-22 HISTORY — PX: TRANSURETHRAL RESECTION OF BLADDER TUMOR: SHX2575

## 2019-06-22 LAB — GLUCOSE, CAPILLARY
Glucose-Capillary: 152 mg/dL — ABNORMAL HIGH (ref 70–99)
Glucose-Capillary: 132 mg/dL — ABNORMAL HIGH (ref 70–99)

## 2019-06-22 SURGERY — TURBT (TRANSURETHRAL RESECTION OF BLADDER TUMOR)
Anesthesia: General

## 2019-06-22 MED ORDER — 0.9 % SODIUM CHLORIDE (POUR BTL) OPTIME
TOPICAL | Status: DC | PRN
  Administered 2019-06-22: 1000 mL

## 2019-06-22 MED ORDER — INDIGOTINDISULFONATE SODIUM 8 MG/ML IJ SOLN
INTRAMUSCULAR | Status: AC
  Filled 2019-06-22: qty 5

## 2019-06-22 MED ORDER — IOHEXOL 300 MG/ML  SOLN
INTRAMUSCULAR | Status: DC | PRN
  Administered 2019-06-22: 10 mL

## 2019-06-22 MED ORDER — LIDOCAINE 2% (20 MG/ML) 5 ML SYRINGE
INTRAMUSCULAR | Status: AC
  Filled 2019-06-22: qty 5

## 2019-06-22 MED ORDER — SUGAMMADEX SODIUM 500 MG/5ML IV SOLN
INTRAVENOUS | Status: DC | PRN
  Administered 2019-06-22: 400 mg via INTRAVENOUS

## 2019-06-22 MED ORDER — SODIUM CHLORIDE 0.9 % IR SOLN
Status: DC | PRN
  Administered 2019-06-22: 15000 mL

## 2019-06-22 MED ORDER — SUGAMMADEX SODIUM 500 MG/5ML IV SOLN
INTRAVENOUS | Status: AC
  Filled 2019-06-22: qty 5

## 2019-06-22 MED ORDER — ONDANSETRON HCL 4 MG/2ML IJ SOLN: 4.0000 mg | Freq: Once | INTRAMUSCULAR | Status: DC | PRN

## 2019-06-22 MED ORDER — DEXAMETHASONE SODIUM PHOSPHATE 10 MG/ML IJ SOLN
INTRAMUSCULAR | Status: DC | PRN
  Administered 2019-06-22: 5 mg via INTRAVENOUS

## 2019-06-22 MED ORDER — PHENAZOPYRIDINE HCL 200 MG PO TABS: 200.0000 mg | ORAL_TABLET | Freq: Three times a day (TID) | ORAL | 0 refills | Status: DC | PRN

## 2019-06-22 MED ORDER — FENTANYL CITRATE (PF) 100 MCG/2ML IJ SOLN: 25.0000 ug | INTRAMUSCULAR | Status: DC | PRN

## 2019-06-22 MED ORDER — LIDOCAINE 2% (20 MG/ML) 5 ML SYRINGE
INTRAMUSCULAR | Status: DC | PRN
  Administered 2019-06-22: 60 mg via INTRAVENOUS

## 2019-06-22 MED ORDER — FENTANYL CITRATE (PF) 250 MCG/5ML IJ SOLN
INTRAMUSCULAR | Status: AC
  Filled 2019-06-22: qty 5

## 2019-06-22 MED ORDER — ROCURONIUM BROMIDE 10 MG/ML (PF) SYRINGE
PREFILLED_SYRINGE | INTRAVENOUS | Status: AC
  Filled 2019-06-22: qty 10

## 2019-06-22 MED ORDER — PROPOFOL 10 MG/ML IV BOLUS
INTRAVENOUS | Status: DC | PRN
  Administered 2019-06-22: 200 mg via INTRAVENOUS

## 2019-06-22 MED ORDER — FENTANYL CITRATE (PF) 250 MCG/5ML IJ SOLN
INTRAMUSCULAR | Status: DC | PRN
  Administered 2019-06-22: 100 ug via INTRAVENOUS
  Administered 2019-06-22 (×3): 50 ug via INTRAVENOUS

## 2019-06-22 MED ORDER — CEFAZOLIN SODIUM-DEXTROSE 2-4 GM/100ML-% IV SOLN
2.0000 g | Freq: Once | INTRAVENOUS | Status: AC
  Administered 2019-06-22: 2 g via INTRAVENOUS
  Filled 2019-06-22: qty 100

## 2019-06-22 MED ORDER — ONDANSETRON HCL 4 MG/2ML IJ SOLN
INTRAMUSCULAR | Status: AC
  Filled 2019-06-22: qty 2

## 2019-06-22 MED ORDER — ACETAMINOPHEN 500 MG PO TABS
1000.0000 mg | ORAL_TABLET | Freq: Once | ORAL | Status: AC
  Administered 2019-06-22: 1000 mg via ORAL
  Filled 2019-06-22: qty 2

## 2019-06-22 MED ORDER — INDIGOTINDISULFONATE SODIUM 8 MG/ML IJ SOLN
INTRAMUSCULAR | Status: DC | PRN
  Administered 2019-06-22 (×2): 5 mL via INTRAVENOUS

## 2019-06-22 MED ORDER — ONDANSETRON HCL 4 MG/2ML IJ SOLN
INTRAMUSCULAR | Status: DC | PRN
  Administered 2019-06-22: 4 mg via INTRAVENOUS

## 2019-06-22 MED ORDER — LACTATED RINGERS IV SOLN
INTRAVENOUS | Status: DC
  Administered 2019-06-22: 13:00:00 via INTRAVENOUS

## 2019-06-22 MED ORDER — ROCURONIUM BROMIDE 10 MG/ML (PF) SYRINGE
PREFILLED_SYRINGE | INTRAVENOUS | Status: DC | PRN
  Administered 2019-06-22: 50 mg via INTRAVENOUS
  Administered 2019-06-22: 10 mg via INTRAVENOUS

## 2019-06-22 MED ORDER — DEXAMETHASONE SODIUM PHOSPHATE 10 MG/ML IJ SOLN
INTRAMUSCULAR | Status: AC
  Filled 2019-06-22: qty 1

## 2019-06-22 MED ORDER — TRAMADOL HCL 50 MG PO TABS: 50.0000 mg | ORAL_TABLET | Freq: Four times a day (QID) | ORAL | 0 refills | Status: DC | PRN

## 2019-06-22 SURGICAL SUPPLY — 16 items
BAG URINE DRAINAGE (UROLOGICAL SUPPLIES) IMPLANT
BAG URO CATCHER STRL LF (MISCELLANEOUS) ×2 IMPLANT
CATH INTERMIT  6FR 70CM (CATHETERS) ×1 IMPLANT
COVER WAND RF STERILE (DRAPES) IMPLANT
ELECT REM PT RETURN 15FT ADLT (MISCELLANEOUS) ×1 IMPLANT
GLOVE BIOGEL M STRL SZ7.5 (GLOVE) ×2 IMPLANT
GOWN STRL REUS W/TWL LRG LVL3 (GOWN DISPOSABLE) ×3 IMPLANT
GUIDEWIRE STR DUAL SENSOR (WIRE) ×1 IMPLANT
KIT TURNOVER KIT A (KITS) IMPLANT
LOOP CUT BIPOLAR 24F LRG (ELECTROSURGICAL) ×1 IMPLANT
MANIFOLD NEPTUNE II (INSTRUMENTS) ×2 IMPLANT
PACK CYSTO (CUSTOM PROCEDURE TRAY) ×2 IMPLANT
STENT URET 6FRX24 CONTOUR (STENTS) ×2 IMPLANT
SYRINGE IRR TOOMEY STRL 70CC (SYRINGE) ×1 IMPLANT
TUBING CONNECTING 10 (TUBING) ×2 IMPLANT
TUBING UROLOGY SET (TUBING) ×2 IMPLANT

## 2019-06-22 NOTE — Anesthesia Postprocedure Evaluation (Signed)
Anesthesia Post Note  Patient: TYRON MANETTA  Procedure(s) Performed: TRANSURETHRAL RESECTION OF BLADDER TUMOR (TURBT), CYSTOSCOPY,  BILATERAL RETROGRADE PYELOGRAPHY, BILATERAL STENT PLACEMENT (N/A )     Patient location during evaluation: PACU Anesthesia Type: General Level of consciousness: awake and alert Pain management: pain level controlled Vital Signs Assessment: post-procedure vital signs reviewed and stable Respiratory status: spontaneous breathing, nonlabored ventilation, respiratory function stable and patient connected to nasal cannula oxygen Cardiovascular status: blood pressure returned to baseline and stable Postop Assessment: no apparent nausea or vomiting Anesthetic complications: no    Last Vitals:  Vitals:   06/22/19 1700 06/22/19 1706  BP: (!) 156/88 (!) 154/81  Pulse: 82 79  Resp: 18 17  Temp:  (!) 36.4 C  SpO2: 100% 94%    Last Pain:  Vitals:   06/22/19 1706  PainSc: 0-No pain                 Ryan P Ellender

## 2019-06-22 NOTE — Transfer of Care (Signed)
Immediate Anesthesia Transfer of Care Note  Patient: Jimmy [name]  Procedure(s) Performed: TRANSURETHRAL RESECTION OF BLADDER TUMOR (TURBT), CYSTOSCOPY,  BILATERAL RETROGRADE PYELOGRAPHY, BILATERAL STENT PLACEMENT (N/A )  Patient Location: PACU  Anesthesia Type:General  Level of Consciousness: sedated  Airway & Oxygen Therapy: Patient Spontanous Breathing and Patient connected to face mask oxygen  Post-op Assessment: Report given to RN and Post -op Vital signs reviewed and stable  Post vital signs: Reviewed and stable  Last Vitals:  Vitals Value Taken Time  BP 156/88 06/22/19 1646  Temp    Pulse 79 06/22/19 1647  Resp 13 06/22/19 1647  SpO2 100 % 06/22/19 1647  Vitals shown include unvalidated device data.  Last Pain:  Vitals:   06/22/19 1240  PainSc: 0-No pain         Complications: No apparent anesthesia complications

## 2019-06-22 NOTE — Interval H&P Note (Signed)
History and Physical Interval Note:  06/22/2019 12:56 PM  Jimmy [name]  has presented today for surgery, with the diagnosis of BLADDER TUMOR.  The various methods of treatment have been discussed with the patient and family. After consideration of risks, benefits and other options for treatment, the patient has consented to  Procedure(s) with comments: TRANSURETHRAL RESECTION OF BLADDER TUMOR (TURBT), CYSTOSCOPY, POSSIBLE BILATERAL RETROGRADE PYELOGRAPHY (N/A) - GENERAL WITH PARALYSIS as a surgical intervention.  The patient's history has been reviewed, patient examined, no change in status, stable for surgery.  I have reviewed the patient's chart and labs.  Questions were answered to the patient's satisfaction.     [employer]

## 2019-06-22 NOTE — Anesthesia Procedure Notes (Signed)
Procedure Name: Intubation Date/Time: 06/22/2019 3:29 PM Performed by: Talbot Grumbling, CRNA Pre-anesthesia Checklist: Patient identified, Emergency Drugs available, Suction available and Patient being monitored Patient Re-evaluated:Patient Re-evaluated prior to induction Oxygen Delivery Method: Circle system utilized Preoxygenation: Pre-oxygenation with 100% oxygen Induction Type: IV induction Ventilation: Mask ventilation without difficulty Laryngoscope Size: 3 and Mac Grade View: Grade II Tube type: Oral Tube size: 7.5 mm Number of attempts: 1 Airway Equipment and Method: Stylet Placement Confirmation: ETT inserted through vocal cords under direct vision,  positive ETCO2 and breath sounds checked- equal and bilateral Secured at: 23 cm Tube secured with: Tape Dental Injury: Teeth and Oropharynx as per pre-operative assessment

## 2019-06-22 NOTE — Discharge Instructions (Addendum)

## 2019-06-22 NOTE — Op Note (Signed)
Preoperative diagnosis: 1.  Bladder mass 2.  Left hydroureteronephrosis  Postoperative diagnosis: 1.  Bladder mass 2.  Left hydroureteronephrosis  Procedures: 1.  Cystoscopy 2.  Pelvic exam under anesthesia 3.  Right ureteral stent placement (6 x 24) 4.  Transurethral resection of bladder tumor (5.3 cm) 5.  Left retrograde pyelography and left ureteral stent placement (6 x 24)  Surgeon: Jimmy Curia MD  Anesthesia: General  Complications: None  EBL: Minimal  Intraoperative findings: Left retrograde pyelography was performed with a 6 French ureteral catheter and Omnipaque contrast.  This revealed a very dilated and tortuous left ureter and dilated left renal collecting system without filling defects of the collecting system or ureter.  Findings were consistent with obstruction at the level of the ureterovesical junction.  Indication: Jimmy [name] is a 73 year old gentleman with a history of prostate cancer status post radiation therapy in the distant past.  He developed recent gross hematuria and subsequently underwent an evaluation including office cystoscopy which revealed a tumor over the majority of the bladder trigone.  CT imaging was also performed and revealed evidence of left ureteral obstruction with left hydroureteronephrosis.  No upper tract abnormalities were noted of the right renal collecting system or right ureter which were clearly seen on his CT imaging.  After reviewing these findings, we discussed options and elected to proceed with the above procedures.  The potential risks, complications, and the expected recovery process was discussed in detail and informed consent was obtained.  Description of procedure: The patient was taken to the operating room and a general anesthetic was administered.  He was given preoperative antibiotics, placed in the dorsolithotomy position, and prepped and draped in the usual sterile fashion.  Next, a preoperative timeout was  performed.  Cystourethroscopy was performed which revealed a normal anterior and posterior urethra.  He did have evidence of lateral lobe hypertrophy and a high bladder neck but no lesions or tumors within the prostatic urethra.  Inspection of the bladder revealed a large frondular tumor encompassing the majority of the bladder trigone particularly toward the left side and extending toward the left lateral wall of the bladder.  Neither ureteral orifice was initially identified.  The remaining bladder mucosa did demonstrate fairly severe trabeculation with multiple diverticuli.  No other tumors were identified.  Indigocarmine was administered and eventually blue reflux was seen at the right hemitrigone.  I was able to cannulate this with a 0.38 sensor guidewire and this was advanced up into the right renal collecting system under fluoroscopic guidance.  A 6 x 24 double-J ureteral stent was then advanced over the wire using Seldinger technique and positioned appropriately under fluoroscopic and cystoscopic guidance.  The wire was removed with a good curl noted in the renal pelvis as well as within the bladder.  No blue reflux was identified on the left side of the trigone.  The resectoscope was then inserted and I proceeded with loop cutting resection of the bladder tumor of the trigone and carefully over the left hemitrigone.  Additional indigocarmine was then administered and a small amount of blue reflux was identified.  This was cannulated and a wire was able to be advanced up into the left renal collecting system.  A 6 French ureteral catheter was then advanced over the wire and Omnipaque contrast was injected.  Findings are as dictated above.  A 6 x 24 double-J ureteral stent was advanced over the wire using Seldinger technique and appropriately positioned as previously described.  The  wire was removed with the stent in good position.  The resectoscope was then replaced and further resection of the tumor was  performed over the left lateral bladder wall and the remaining tumor seen across the trigone.  Hemostasis was achieved with loop cautery.  The bladder was then emptied and all tumor fragments were removed.  Reinspection revealed excellent hemostasis.  The bladder was emptied and the procedure ended.  He tolerated procedure well without complications.  He was able to be extubated and transferred to the recovery unit in satisfactory condition.

## 2019-06-23 ENCOUNTER — Encounter (HOSPITAL_COMMUNITY): Payer: Self-pay | Admitting: Urology

## 2019-07-14 ENCOUNTER — Telehealth: Payer: Self-pay | Admitting: Oncology

## 2019-07-14 NOTE — Telephone Encounter (Signed)
Received a new patient referral from Dr. Alinda Money for bladder cancer. Pt has been scheduled to see Dr. Alen Blew on 7/21 at 2pm. Appt date and time has been given to the pt's wife.

## 2019-07-16 ENCOUNTER — Ambulatory Visit: Payer: Medicare Other | Admitting: Oncology

## 2019-07-21 ENCOUNTER — Inpatient Hospital Stay: Payer: Medicare Other | Attending: Oncology | Admitting: Oncology

## 2019-07-21 ENCOUNTER — Other Ambulatory Visit: Payer: Self-pay

## 2019-07-21 ENCOUNTER — Ambulatory Visit: Payer: Medicare Other | Admitting: Oncology

## 2019-07-21 VITALS — BP 131/68 | HR 96 | Temp 99.1°F | Resp 17 | Ht 68.0 in | Wt 258.3 lb

## 2019-07-21 DIAGNOSIS — Z79899 Other long term (current) drug therapy: Secondary | ICD-10-CM

## 2019-07-21 DIAGNOSIS — C679 Malignant neoplasm of bladder, unspecified: Secondary | ICD-10-CM | POA: Diagnosis not present

## 2019-07-21 DIAGNOSIS — N1330 Unspecified hydronephrosis: Secondary | ICD-10-CM | POA: Diagnosis not present

## 2019-07-21 DIAGNOSIS — Z8546 Personal history of malignant neoplasm of prostate: Secondary | ICD-10-CM | POA: Diagnosis not present

## 2019-07-21 NOTE — Progress Notes (Signed)
Reason for the request:      HPI: I was asked by Dr. bladder cancer to evaluate Jimmy [name] for new diagnosis of bladder cancer.  He is a 73 year old gentleman with history of diabetes, coronary disease as well as prostate cancer.  He was found to have Gleason score 4+4 = 8 with a PSA of 5.35 and 2008.  He completed external beam radiation and seed implants in March 2009 and completed 2 years of androgen deprivation 2010.  He has been on active surveillance at that time under the care of Dr. Alinda Money.  He started developing hematuria and underwent a cystoscopy initially which showed a mass in the midline within the bladder neck along the trigone in June 2020.  He subsequently underwent a CT scan of the abdomen and pelvis with and without contrast obtained on 06/18/2019 showed posterior bladder mass measuring approximately 25 cc and obstructs the left UVJ and left-sided hydronephrosis.  Based on these findings he underwent TURBT with right ureteral stent placement as well as left ureteral stent placement at that time.  The final pathology showed infiltrating high-grade papillary adenocarcinoma invading into the detrusor muscle with lymphovascular invasion identified.  This was completed on 06/22/2019.  CT scan of the chest completed on July 1 complete the staging showed a stable 4 mm upper lobe nodule without any evidence of metastatic disease.  Based on these findings, Dr. Alinda Money I recommended proceeding with radical cystectomy after neoadjuvant chemotherapy is his best choice moving forward.  He is here to discuss treatment options as well as the role of neoadjuvant chemotherapy.  Clinically, he does report some symptoms of fatigue but does report improvement in his hematuria and occasional flank pain.  He continues to be mobile and able to drive without any difficulties.  He denies any recent falls or syncope.  He denies any recent hospitalization or illnesses.  He does not report any headaches, blurry  vision, syncope or seizures. Does not report any fevers, chills or sweats.  Does not report any cough, wheezing or hemoptysis.  Does not report any chest pain, palpitation, orthopnea or leg edema.  Does not report any nausea, vomiting or abdominal pain.  Does not report any constipation or diarrhea.  Does not report any skeletal complaints.    Does not report frequency, urgency or hematuria.  Does not report any skin rashes or lesions. Does not report any heat or cold intolerance.  Does not report any lymphadenopathy or petechiae.  Does not report any anxiety or depression.  Remaining review of systems is negative.    Past Medical History:  Diagnosis Date  . Astigmatism of both eyes 11/01/2016  . Bronchitis   . Coronary artery disease   . Cortical age-related cataract of both eyes 11/01/2016  . Diabetes mellitus without complication ([place])    type 2  . Drug-induced osteoporosis   . Dyslipidemia   . Fatigue   . Fatty liver   . History of kidney stones   . Hypercholesteremia   . Hypogonadism male   . 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 Normal nuclear stress test March 2011  . Nuclear sclerotic cataract of both eyes 11/01/2016  . Obesity   . Prostate cancer [facility])    s/p seed implant and radiation  . Sleep apnea    90% of time dosent wear his cpap per pt  . Vitamin D insufficiency   :  Past Surgical History:  Procedure Laterality Date  . CARDIAC CATHETERIZATION  April 2000  . RADIOACTIVE SEED IMPLANT  2009   Implanted radiation seeds  . thumb surgery     as a teen caught thumb in machine  . TRANSURETHRAL RESECTION OF BLADDER TUMOR N/A 06/22/2019   Procedure: TRANSURETHRAL RESECTION OF BLADDER TUMOR (TURBT), CYSTOSCOPY,  BILATERAL RETROGRADE PYELOGRAPHY, BILATERAL STENT PLACEMENT;  Surgeon: Raynelle Bring, MD;  Location: [facility];  Service: Urology;  Laterality: N/A;  GENERAL WITH PARALYSIS   :   Current Outpatient Medications:  .  B-D UF III MINI PEN NEEDLES 31G X 5 MM MISC, USE TO INJECT INSULIN EVERY DAY, Disp: 100 each, Rfl: 5 .  blood glucose meter kit and supplies, Dispense One touch ultra meter. E11.9, Disp: 1 each, Rfl: 0 .  cholecalciferol (VITAMIN D) 1000 UNITS tablet, Take 2,000 Units by mouth daily. , Disp: , Rfl:  .  Coenzyme Q10 (COQ10 PO), Take 300 mg by mouth daily. , Disp: , Rfl:  .  Icosapent Ethyl (VASCEPA) 1 g CAPS, Take 2 capsules (2 g total) by mouth 2 (two) times a day., Disp: 360 capsule, Rfl: 3 .  liraglutide (VICTOZA) 18 MG/3ML SOPN, Inject 0.3 mLs (1.8 mg total) into the skin daily., Disp: 9 pen, Rfl: 1 .  losartan (COZAAR) 100 MG tablet, Take 1 tablet (100 mg total) by mouth daily., Disp: 90 tablet, Rfl: 3 .  Magnesium 250 MG TABS, Take 250 mg by mouth daily. , Disp: , Rfl:  .  metFORMIN (GLUCOPHAGE) 1000 MG tablet, TAKE 1 TABLET BY MOUTH TWICE A DAY WITH A MEAL (Patient taking differently: Take 1,000 mg by mouth 2 (two) times daily with a meal. TAKE 1 TABLET BY MOUTH TWICE A DAY WITH A MEAL), Disp: 180 tablet, Rfl: 1 .  Multiple Vitamins-Minerals (MULTIVITAMIN WITH MINERALS) tablet, Take 1 tablet by mouth daily.  , Disp: , Rfl:  .  ONE TOUCH ULTRA TEST test strip, USE TO TEST BLOOD SUGAR DAILY AS DIRECTED BY DOCTOR, Disp: 100 each, Rfl: 3 .  ONETOUCH DELICA LANCETS 05R MISC, TEST ONCE DAILY AS DIRECTED, Disp: 100 each, Rfl: 2 .  ONETOUCH DELICA LANCETS 10Y MISC, USE AS DIRECTED TO TEST BLOOD SUGAR EVERY DAY, Disp: 100 each, Rfl: 2 .  phenazopyridine (PYRIDIUM) 200 MG tablet, Take 1 tablet (200 mg total) by mouth 3 (three) times daily as needed for pain., Disp: 10 tablet, Rfl: 0 .  Pitavastatin Calcium (LIVALO) 4 MG TABS, Take 1 tablet (4 mg total) by mouth daily., Disp: 90 tablet, Rfl: 3 .  traMADol (ULTRAM) 50 MG tablet, Take 1-2 tablets (50-100 mg total) by mouth every 6 (six) hours as needed (pain)., Disp: 15 tablet, Rfl: 0:  Allergies  Allergen  Reactions  . Ace Inhibitors Cough  . Cephalexin   . Crestor [Rosuvastatin Calcium]   . Lipitor [Atorvastatin Calcium] Other (See Comments)    MUSCLE PAINS   . Plavix [Clopidogrel Bisulfate] Hives  :  Family History  Problem Relation Age of Onset  . COPD Father   :  Social History   Socioeconomic History  . Marital status: Married    Spouse name: Not on file  . Number of children: Not on file  . Years of education: Not on file  . Highest education level: Not on file  Occupational History  . Not on file  Social Needs  . Financial resource strain: Not on file  . Food insecurity    Worry: Not on file  Inability: Not on file  . Transportation needs    Medical: Not on file    Non-medical: Not on file  Tobacco Use  . Smoking status: Former Smoker    Packs/day: 1.00    Years: 40.00    Pack years: 40.00    Types: Cigarettes    Quit date: 08/20/1999    Years since quitting: 19.9  . Smokeless tobacco: Never Used  Substance and Sexual Activity  . Alcohol use: Yes    Alcohol/week: 1.0 standard drinks    Types: 1 Shots of liquor per week    Comment: occasional  . Drug use: No  . Sexual activity: Not Currently  Lifestyle  . Physical activity    Days per week: Not on file    Minutes per session: Not on file  . Stress: Not on file  Relationships  . Social Herbalist on phone: Not on file    Gets together: Not on file    Attends religious service: Not on file    Active member of club or organization: Not on file    Attends meetings of clubs or organizations: Not on file    Relationship status: Not on file  . Intimate partner violence    Fear of current or ex partner: Not on file    Emotionally abused: Not on file    Physically abused: Not on file    Forced sexual activity: Not on file  Other Topics Concern  . Not on file  Social History Narrative  . Not on file  :  Pertinent items are noted in HPI.  Exam: Blood pressure 131/68, pulse 96,  temperature 99.1 F (37.3 C), temperature source Oral, resp. rate 17, height '5\' 8"'  (1.727 m), weight 258 lb 4.8 oz (117.2 kg), SpO2 94 %.  ECOG 1 General appearance: alert and cooperative appeared without distress. Head: atraumatic without any abnormalities. Eyes: conjunctivae/corneas clear. PERRL.  Sclera anicteric. Throat: lips, mucosa, and tongue normal; without oral thrush or ulcers. Resp: clear to auscultation bilaterally without rhonchi, wheezes or dullness to percussion. Cardio: regular rate and rhythm, S1, S2 normal, no murmur, click, rub or gallop GI: soft, non-tender; bowel sounds normal; no masses,  no organomegaly Skin: Skin color, texture, turgor normal. No rashes or lesions Lymph nodes: Cervical, supraclavicular, and axillary nodes normal. Neurologic: Grossly normal without any motor, sensory or deep tendon reflexes. Musculoskeletal: No joint deformity or effusion.   CBC    Component Value Date/Time   WBC 5.6 06/19/2019 1001   RBC 4.03 (L) 06/19/2019 1001   HGB 11.8 (L) 06/19/2019 1001   HGB 12.7 (L) 06/03/2019 1157   HCT 38.7 (L) 06/19/2019 1001   HCT 38.6 06/03/2019 1157   PLT 165 06/19/2019 1001   PLT 147 (L) 06/03/2019 1157   MCV 96.0 06/19/2019 1001   MCV 91 06/03/2019 1157   MCH 29.3 06/19/2019 1001   MCHC 30.5 06/19/2019 1001   RDW 14.6 06/19/2019 1001   RDW 14.1 06/03/2019 1157   LYMPHSABS 1.0 06/03/2019 1157   MONOABS 0.6 03/17/2015 1145   EOSABS 0.2 06/03/2019 1157   BASOSABS 0.0 06/03/2019 1157     Chemistry      Component Value Date/Time   NA 137 06/19/2019 1001   NA 141 06/03/2019 1157   K 5.4 (H) 06/19/2019 1001   CL 105 06/19/2019 1001   CO2 23 06/19/2019 1001   BUN 22 06/19/2019 1001   BUN 19 06/03/2019 1157   CREATININE 1.56 (H)  06/19/2019 1001   CREATININE 0.89 11/19/2016 0920      Component Value Date/Time   CALCIUM 9.8 06/19/2019 1001   ALKPHOS 59 06/03/2019 1157   AST 52 (H) 06/03/2019 1157   ALT 47 (H) 06/03/2019 1157    BILITOT 0.3 06/03/2019 1157         Assessment and Plan:   73 year old man with:  1.  Bladder cancer diagnosed in June 2020.  He was found to have posterior wall tumor with left-sided hydronephrosis.  He status post TURBT completed on 06/22/2019 with muscle invasive disease.  Treatment options were reviewed as well as the natural course of this disease was outlined.  Radical cystectomy would be his best option given his history of radiation for prostate cancer and appears to be a reasonable candidate for this approach.  Alternatively, radiation therapy might offer an option if surgery is contraindicated.  He understands the risk will be likely higher given his previous history of radiation.  I feel it would be reasonable to proceed with neoadjuvant chemotherapy and he would be a candidate for this approach followed by radical cystectomy.   The logistics and rationale for using chemotherapy was reviewed today in detail.  Complication associated with cisplatin and gemcitabine chemotherapy was discussed.  These complications include nausea, vomiting, myelosuppression, fatigue, infusion related complications, renal insufficiency, neutropenia, neutropenic sepsis and rarely serious thrombosis, hospitalization and death.  The benefit would also if he has an excellent response to chemotherapy, curative surgical resection may be attempted.  The plan is to treat with gemcitabine and cisplatin on day 1, gemcitabine day 8 out of a 21-day cycle.  Anticipate needing 3 to 4  cycles of therapy    After discussion today, he would like to think about this option and likely will proceed however after chemo education class.  He will let me know about his decision in the near future.     2.  IV access: Risks and benefits of using Port-A-Cath versus peripheral veins was discussed today.  Complication associated with Port-A-Cath insertion include bleeding, infection and thrombosis.  After discussing the risks and  benefits, he will be willing to proceed with a Port-A-Cath insertion F he agrees to proceed with chemotherapy.   3.  Antiemetics: Prescription for Compazine was made available to him.   4.  Renal function surveillance:  His creatinine has improved after ureteral stent placement.  We will continue to monitor his kidney function on cisplatin.   5.  Goals of care:  Therapy would be curative at this time if he elected to proceed with chemotherapy.  6.  Fatigue: We will check his hemoglobin as well as iron studies before the start of therapy to ensure adequate stores given his previous history of hematuria.  7.  Prostate cancer: Treated in 2009 with radiation and androgen deprivation.  He appears to be in remission at this time.   8.  Follow-up: We will be in the future after he decides on approach of treatment and start chemotherapy.   60  minutes was spent with the patient face-to-face today.  More than 50% of time was dedicated to discussing his disease status, treatment options, complication related to these therapies and answering his questions and his wife's question over the phone.   Thank you for the referral.  A copy of this consult has been forwarded to the requesting physician.

## 2019-07-24 ENCOUNTER — Other Ambulatory Visit: Payer: Self-pay | Admitting: Oncology

## 2019-07-24 DIAGNOSIS — C679 Malignant neoplasm of bladder, unspecified: Secondary | ICD-10-CM | POA: Insufficient documentation

## 2019-07-24 DIAGNOSIS — Z7189 Other specified counseling: Secondary | ICD-10-CM | POA: Insufficient documentation

## 2019-07-24 MED ORDER — LIDOCAINE-PRILOCAINE 2.5-2.5 % EX CREA: 1.0000 "application " | TOPICAL_CREAM | CUTANEOUS | 0 refills | Status: DC | PRN

## 2019-07-24 MED ORDER — PROCHLORPERAZINE MALEATE 10 MG PO TABS: 10.0000 mg | ORAL_TABLET | Freq: Four times a day (QID) | ORAL | 0 refills | Status: DC | PRN

## 2019-07-24 NOTE — Progress Notes (Signed)

## 2019-07-27 ENCOUNTER — Telehealth: Payer: Self-pay | Admitting: *Deleted

## 2019-07-27 NOTE — Telephone Encounter (Signed)
Received message from one of the nurses that pt had questions about education appt.  Returned call & spoke with wife.  She states pt is [facility] & would like to know if teaching can be done by phone so she can hear also.  Informed that we can do by phone.  She would like info before this appt.  Written info left at front desk for p/u tomorrow.

## 2019-07-29 ENCOUNTER — Other Ambulatory Visit: Payer: Self-pay | Admitting: Student

## 2019-07-30 ENCOUNTER — Other Ambulatory Visit: Payer: Self-pay

## 2019-07-30 ENCOUNTER — Encounter (HOSPITAL_COMMUNITY): Payer: Self-pay

## 2019-07-30 ENCOUNTER — Ambulatory Visit (HOSPITAL_COMMUNITY)
Admission: RE | Admit: 2019-07-30 | Discharge: 2019-07-30 | Disposition: A | Discharge status: Home / Self Care | Payer: Medicare Other | Source: Ambulatory Visit | Attending: Oncology | Admitting: Oncology

## 2019-07-30 ENCOUNTER — Other Ambulatory Visit: Payer: Self-pay | Admitting: Oncology

## 2019-07-30 DIAGNOSIS — Z888 Allergy status to other drugs, medicaments and biological substances status: Secondary | ICD-10-CM | POA: Insufficient documentation

## 2019-07-30 DIAGNOSIS — E7800 Pure hypercholesterolemia, unspecified: Secondary | ICD-10-CM | POA: Insufficient documentation

## 2019-07-30 DIAGNOSIS — K760 Fatty (change of) liver, not elsewhere classified: Secondary | ICD-10-CM | POA: Insufficient documentation

## 2019-07-30 DIAGNOSIS — Z881 Allergy status to other antibiotic agents status: Secondary | ICD-10-CM | POA: Insufficient documentation

## 2019-07-30 DIAGNOSIS — G4733 Obstructive sleep apnea (adult) (pediatric): Secondary | ICD-10-CM | POA: Diagnosis not present

## 2019-07-30 DIAGNOSIS — C679 Malignant neoplasm of bladder, unspecified: Secondary | ICD-10-CM

## 2019-07-30 DIAGNOSIS — M810 Age-related osteoporosis without current pathological fracture: Secondary | ICD-10-CM | POA: Insufficient documentation

## 2019-07-30 DIAGNOSIS — E119 Type 2 diabetes mellitus without complications: Secondary | ICD-10-CM | POA: Insufficient documentation

## 2019-07-30 DIAGNOSIS — H25013 Cortical age-related cataract, bilateral: Secondary | ICD-10-CM | POA: Diagnosis not present

## 2019-07-30 DIAGNOSIS — I10 Essential (primary) hypertension: Secondary | ICD-10-CM | POA: Diagnosis not present

## 2019-07-30 DIAGNOSIS — E559 Vitamin D deficiency, unspecified: Secondary | ICD-10-CM | POA: Insufficient documentation

## 2019-07-30 DIAGNOSIS — Z8546 Personal history of malignant neoplasm of prostate: Secondary | ICD-10-CM | POA: Diagnosis not present

## 2019-07-30 DIAGNOSIS — Z87891 Personal history of nicotine dependence: Secondary | ICD-10-CM | POA: Insufficient documentation

## 2019-07-30 DIAGNOSIS — Z79899 Other long term (current) drug therapy: Secondary | ICD-10-CM | POA: Diagnosis not present

## 2019-07-30 DIAGNOSIS — Z794 Long term (current) use of insulin: Secondary | ICD-10-CM | POA: Diagnosis not present

## 2019-07-30 DIAGNOSIS — I2510 Atherosclerotic heart disease of native coronary artery without angina pectoris: Secondary | ICD-10-CM | POA: Insufficient documentation

## 2019-07-30 HISTORY — PX: IR IMAGING GUIDED PORT INSERTION: IMG5740

## 2019-07-30 LAB — CBC
HCT: 36.4 % — ABNORMAL LOW (ref 39.0–52.0)
Hemoglobin: 11.4 g/dL — ABNORMAL LOW (ref 13.0–17.0)
MCH: 28.8 pg (ref 26.0–34.0)
MCHC: 31.3 g/dL (ref 30.0–36.0)
MCV: 91.9 fL (ref 80.0–100.0)
Platelets: 138 10*3/uL — ABNORMAL LOW (ref 150–400)
RBC: 3.96 MIL/uL — ABNORMAL LOW (ref 4.22–5.81)
RDW: 14.3 % (ref 11.5–15.5)
WBC: 4.8 10*3/uL (ref 4.0–10.5)
nRBC: 0 % (ref 0.0–0.2)

## 2019-07-30 LAB — GLUCOSE, CAPILLARY: Glucose-Capillary: 184 mg/dL — ABNORMAL HIGH (ref 70–99)

## 2019-07-30 LAB — APTT: aPTT: 29 seconds (ref 24–36)

## 2019-07-30 LAB — PROTIME-INR
INR: 1 (ref 0.8–1.2)
Prothrombin Time: 12.7 seconds (ref 11.4–15.2)

## 2019-07-30 MED ORDER — SODIUM CHLORIDE 0.9 % IV SOLN
INTRAVENOUS | Status: DC
  Administered 2019-07-30: 09:00:00 via INTRAVENOUS

## 2019-07-30 MED ORDER — MIDAZOLAM HCL 2 MG/2ML IJ SOLN
INTRAMUSCULAR | Status: AC | PRN
  Administered 2019-07-30 (×2): 1 mg via INTRAVENOUS

## 2019-07-30 MED ORDER — LIDOCAINE-EPINEPHRINE (PF) 2 %-1:200000 IJ SOLN
INTRAMUSCULAR | Status: AC | PRN
  Administered 2019-07-30: 10 mL

## 2019-07-30 MED ORDER — LIDOCAINE-EPINEPHRINE (PF) 2 %-1:200000 IJ SOLN
INTRAMUSCULAR | Status: AC | PRN
  Administered 2019-07-30: 5 mL

## 2019-07-30 MED ORDER — HEPARIN SOD (PORK) LOCK FLUSH 100 UNIT/ML IV SOLN
INTRAVENOUS | Status: AC
  Filled 2019-07-30: qty 5

## 2019-07-30 MED ORDER — LIDOCAINE-EPINEPHRINE (PF) 2 %-1:200000 IJ SOLN
INTRAMUSCULAR | Status: AC
  Filled 2019-07-30: qty 20

## 2019-07-30 MED ORDER — FENTANYL CITRATE (PF) 100 MCG/2ML IJ SOLN
INTRAMUSCULAR | Status: AC
  Filled 2019-07-30: qty 2

## 2019-07-30 MED ORDER — HEPARIN SOD (PORK) LOCK FLUSH 100 UNIT/ML IV SOLN
INTRAVENOUS | Status: AC | PRN
  Administered 2019-07-30: 500 [IU] via INTRAVENOUS

## 2019-07-30 MED ORDER — FENTANYL CITRATE (PF) 100 MCG/2ML IJ SOLN
INTRAMUSCULAR | Status: AC | PRN
  Administered 2019-07-30 (×2): 50 ug via INTRAVENOUS

## 2019-07-30 MED ORDER — VANCOMYCIN HCL 10 G IV SOLR
1500.0000 mg | Freq: Once | INTRAVENOUS | Status: AC
  Administered 2019-07-30: 1500 mg via INTRAVENOUS
  Filled 2019-07-30: qty 1500

## 2019-07-30 MED ORDER — MIDAZOLAM HCL 2 MG/2ML IJ SOLN
INTRAMUSCULAR | Status: AC
  Filled 2019-07-30: qty 4

## 2019-07-30 NOTE — Procedures (Signed)
  Procedure: R IJ Port pLacement    EBL:   minimal Complications:  none immediate  See full dictation in [employer].  Dillard Cannon MD Main # [phone] Pager  [phone]

## 2019-07-30 NOTE — H&P (Signed)
Chief Complaint: Patient was seen in consultation today for bladder cancer/Port-a-cath placement.  Referring Physician(s): Wyatt Portela  Supervising Physician: Arne Cleveland  Patient Status: [facility] - Out-pt  History of Present Illness: Jimmy [name] is a 73 y.o. male with a past medical history of hypertension, hypercholesteremia, CAD, bronchitis, fatty liver disease, nephrolithiasis, bladder cancer, prostate cancer, diabetes mellitus type II, vitamin D deficiency, bilateral cataracts, drug-induced osteoporosis, obesity, and OSA on CPAP. He was unfortunately diagnosed with bladder cancer in 06/2019. His cancer is managed by Dr. Alen Blew. He has tentative plans to begin systemic chemotherapy.  IR requested by Dr. Alen Blew for possible image-guided Port-a-cath placement. Patient awake and alert laying in bed with no complaints at this time. Denies fever, chills, chest pain, dyspnea, abdominal pain, or headache.   Past Medical History:  Diagnosis Date  . Astigmatism of both eyes 11/01/2016  . Bronchitis   . Coronary artery disease   . Cortical age-related cataract of both eyes 11/01/2016  . Diabetes mellitus without complication ([place])    type 2  . Drug-induced osteoporosis   . Dyslipidemia   . Fatigue   . Fatty liver   . History of kidney stones   . Hypercholesteremia   . Hypogonadism male   . 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 Normal nuclear stress test March 2011  . Nuclear sclerotic cataract of both eyes 11/01/2016  . Obesity   . Prostate cancer [facility])    s/p seed implant and radiation  . Sleep apnea    90% of time dosent wear his cpap per pt  . Vitamin D insufficiency     Past Surgical History:  Procedure Laterality Date  . CARDIAC CATHETERIZATION  April 2000  . RADIOACTIVE SEED IMPLANT  2009   Implanted radiation seeds  . thumb surgery     as a teen caught thumb in  machine  . TRANSURETHRAL RESECTION OF BLADDER TUMOR N/A 06/22/2019   Procedure: TRANSURETHRAL RESECTION OF BLADDER TUMOR (TURBT), CYSTOSCOPY,  BILATERAL RETROGRADE PYELOGRAPHY, BILATERAL STENT PLACEMENT;  Surgeon: Raynelle Bring, MD;  Location: [facility];  Service: Urology;  Laterality: N/A;  GENERAL WITH PARALYSIS    Allergies: Ace inhibitors, Cephalexin, Crestor [rosuvastatin calcium], Lipitor [atorvastatin calcium], and Plavix [clopidogrel bisulfate]  Medications: Prior to Admission medications   Medication Sig Start Date End Date Taking? Authorizing Provider  B-D UF III MINI PEN NEEDLES 31G X 5 MM MISC USE TO INJECT INSULIN EVERY DAY 10/14/18  Yes [name], MD  blood glucose meter kit and supplies Dispense One touch ultra meter. E11.9 11/06/17  Yes [name], MD  cholecalciferol (VITAMIN D) 1000 UNITS tablet Take 2,000 Units by mouth daily.    Yes [provider]  Coenzyme Q10 (COQ10 PO) Take 300 mg by mouth daily.    Yes [provider]  Icosapent Ethyl (VASCEPA) 1 g CAPS Take 2 capsules (2 g total) by mouth 2 (two) times a day. 06/04/19  Yes [name], MD  liraglutide (VICTOZA) 18 MG/3ML SOPN Inject 0.3 mLs (1.8 mg total) into the skin daily. 06/03/19  Yes [name], MD  losartan (COZAAR) 100 MG tablet Take 1 tablet (100 mg total) by mouth daily. 06/03/19  Yes [name], MD  Magnesium 250 MG TABS Take 250 mg by mouth daily.    Yes [provider]  metFORMIN (GLUCOPHAGE) 1000 MG tablet TAKE  1 TABLET BY MOUTH TWICE A DAY WITH A MEAL Patient taking differently: Take 1,000 mg by mouth 2 (two) times daily with a meal. TAKE 1 TABLET BY MOUTH TWICE A DAY WITH A MEAL 06/03/19  Yes [name], MD  Multiple Vitamins-Minerals (MULTIVITAMIN WITH MINERALS) tablet Take 1 tablet by mouth daily.     Yes [provider]  ONE TOUCH ULTRA TEST test strip USE TO TEST BLOOD SUGAR DAILY AS DIRECTED BY DOCTOR 12/01/18  Yes [name], MD  [facility]  DELICA LANCETS 80H MISC TEST ONCE DAILY AS DIRECTED 11/20/16  Yes [name], MD  [facility] DELICA LANCETS 21Y MISC USE AS DIRECTED TO TEST BLOOD SUGAR EVERY DAY 09/02/18  Yes [name], MD  phenazopyridine (PYRIDIUM) 200 MG tablet Take 1 tablet (200 mg total) by mouth 3 (three) times daily as needed for pain. 06/22/19  Yes Raynelle Bring, MD  Pitavastatin Calcium (LIVALO) 4 MG TABS Take 1 tablet (4 mg total) by mouth daily. 06/03/19  Yes [name], MD  lidocaine-prilocaine (EMLA) cream Apply 1 application topically as needed. 07/24/19   Wyatt Portela, MD  prochlorperazine (COMPAZINE) 10 MG tablet Take 1 tablet (10 mg total) by mouth every 6 (six) hours as needed for nausea or vomiting. 07/24/19   Wyatt Portela, MD  traMADol (ULTRAM) 50 MG tablet Take 1-2 tablets (50-100 mg total) by mouth every 6 (six) hours as needed (pain). 06/22/19   Raynelle Bring, MD     Family History  Problem Relation Age of Onset  . COPD Father     Social History   Socioeconomic History  . Marital status: Married    Spouse name: Not on file  . Number of children: Not on file  . Years of education: Not on file  . Highest education level: Not on file  Occupational History  . Not on file  Social Needs  . Financial resource strain: Not on file  . Food insecurity    Worry: Not on file    Inability: Not on file  . Transportation needs    Medical: Not on file    Non-medical: Not on file  Tobacco Use  . Smoking status: Former Smoker    Packs/day: 1.00    Years: 40.00    Pack years: 40.00    Types: Cigarettes    Quit date: 08/20/1999    Years since quitting: 19.9  . Smokeless tobacco: Never Used  Substance and Sexual Activity  . Alcohol use: Yes    Alcohol/week: 1.0 standard drinks    Types: 1 Shots of liquor per week    Comment: occasional  . Drug use: No  . Sexual activity: Not Currently  Lifestyle  . Physical activity    Days per week: Not on file    Minutes per session: Not on file   . Stress: Not on file  Relationships  . Social Herbalist on phone: Not on file    Gets together: Not on file    Attends religious service: Not on file    Active member of club or organization: Not on file    Attends meetings of clubs or organizations: Not on file    Relationship status: Not on file  Other Topics Concern  . Not on file  Social History Narrative  . Not on file     Review of Systems: A 12 point ROS discussed and pertinent positives are indicated in the HPI above.  All other systems are negative.  Review of Systems  Constitutional: Negative for chills and fever.  Respiratory: Negative for shortness of breath and wheezing.   Cardiovascular: Negative for chest pain and palpitations.  Gastrointestinal: Negative for abdominal pain.  Neurological: Negative for headaches.  Psychiatric/Behavioral: Negative for behavioral problems and confusion.    Vital Signs: BP (!) 176/78   Pulse 96   Temp 98.9 F (37.2 C) (Oral)   Resp 20   SpO2 96%   Physical Exam Vitals signs and nursing note reviewed.  Constitutional:      General: He is not in acute distress.    Appearance: Normal appearance.  Cardiovascular:     Rate and Rhythm: Normal rate and regular rhythm.     Heart sounds: Normal heart sounds. No murmur.  Pulmonary:     Effort: Pulmonary effort is normal. No respiratory distress.     Breath sounds: Normal breath sounds. No wheezing.  Skin:    General: Skin is warm and dry.  Neurological:     Mental Status: He is alert and oriented to person, place, and time.  Psychiatric:        Mood and Affect: Mood normal.        Behavior: Behavior normal.        Thought Content: Thought content normal.        Judgment: Judgment normal.      MD Evaluation Airway: WNL Heart: WNL Abdomen: WNL Chest/ Lungs: WNL ASA  Classification: 3 Mallampati/Airway Score: One   Imaging: No results found.  Labs:  CBC: Recent Labs    06/03/19 1157 06/19/19  1001 07/30/19 0850  WBC 6.4 5.6 4.8  HGB 12.7* 11.8* 11.4*  HCT 38.6 38.7* 36.4*  PLT 147* 165 138*    COAGS: Recent Labs    07/30/19 0850  INR 1.0  APTT 29    BMP: Recent Labs    08/06/18 1153 06/03/19 1157 06/19/19 1001  NA 140 141 137  K 5.2 4.9 5.4*  CL 103 102 105  CO2 22 19* 23  GLUCOSE 130* 134* 161*  BUN '19 19 22  ' CALCIUM 10.3* 9.9 9.8  CREATININE 1.04 1.12 1.56*  GFRNONAA 71 65 43*  GFRAA 83 75 50*    LIVER FUNCTION TESTS: Recent Labs    06/03/19 1157  BILITOT 0.3  AST 52*  ALT 47*  ALKPHOS 59  PROT 7.5  ALBUMIN 4.7     Assessment and Plan:  Bladder cancer with tentative plans to begin systemic chemotherapy. Plan for image-guided Port-a-cath placement today with Dr. Vernard Gambles. Patient is NPO. Afebrile and WBCs WNL. He does not take blood thinners. INR 1.0 today.  Risks and benefits of image guided port-a-catheter placement were discussed with the patient including, but not limited to bleeding, infection, pneumothorax, or fibrin sheath development and need for additional procedures. All of the patient's questions were answered, patient is agreeable to proceed. Consent signed and in chart.   Thank you for this interesting consult.  I greatly enjoyed meeting FLAVIUS REPSHER and look forward to participating in their care.  A copy of this report was sent to the requesting provider on this date.  Electronically Signed: Earley Abide, PA-C 07/30/2019, 9:22 AM   I spent a total of 30 Minutes in face to face in clinical consultation, greater than 50% of which was counseling/coordinating care for bladder cancer/Port-a-cath placement.

## 2019-07-30 NOTE — Discharge Instructions (Signed)
Moderate Conscious Sedation, Adult, Care After °These instructions provide you with information about caring for yourself after your procedure. Your health care provider may also give you more specific instructions. Your treatment has been planned according to current medical practices, but problems sometimes occur. Call your health care provider if you have any problems or questions after your procedure. °What can I expect after the procedure? °After your procedure, it is common: °· To feel sleepy for several hours. °· To feel clumsy and have poor balance for several hours. °· To have poor judgment for several hours. °· To vomit if you eat too soon. °Follow these instructions at home: °For at least 24 hours after the procedure: ° °· Do not: °? Participate in activities where you could fall or become injured. °? Drive. °? Use heavy machinery. °? Drink alcohol. °? Take sleeping pills or medicines that cause drowsiness. °? Make important decisions or sign legal documents. °? Take care of children on your own. °· Rest. °Eating and drinking °· Follow the diet recommended by your health care provider. °· If you vomit: °? Drink water, juice, or soup when you can drink without vomiting. °? Make sure you have little or no nausea before eating solid foods. °General instructions °· Have a responsible adult stay with you until you are awake and alert. °· Take over-the-counter and prescription medicines only as told by your health care provider. °· If you smoke, do not smoke without supervision. °· Keep all follow-up visits as told by your health care provider. This is important. °Contact a health care provider if: °· You keep feeling nauseous or you keep vomiting. °· You feel light-headed. °· You develop a rash. °· You have a fever. °Get help right away if: °· You have trouble breathing. °This information is not intended to replace advice given to you by your health care provider. Make sure you discuss any questions you have  with your health care provider. °Document Released: 10/07/2013 Document Revised: 11/29/2017 Document Reviewed: 04/07/2016 °Elsevier Patient Education © 2020 Elsevier Inc. ° ° °Implanted Port Insertion, Care After °This sheet gives you information about how to care for yourself after your procedure. Your health care provider may also give you more specific instructions. If you have problems or questions, contact your health care provider. °What can I expect after the procedure? °After the procedure, it is common to have: °· Discomfort at the port insertion site. °· Bruising on the skin over the port. This should improve over 3-4 days. °Follow these instructions at home: °Port care °· After your port is placed, you will get a manufacturer's information card. The card has information about your port. Keep this card with you at all times. °· Take care of the port as told by your health care provider. Ask your health care provider if you or a family member can get training for taking care of the port at home. A home health care nurse may also take care of the port. °· Make sure to remember what type of port you have. °Incision care ° °  ° °· Follow instructions from your health care provider about how to take care of your port insertion site. Make sure you: °? Wash your hands with soap and water before and after you change your bandage (dressing). If soap and water are not available, use hand sanitizer. °? Change your dressing as told by your health care provider.  You may remove your dressing tomorrow. °? Leave stitches (sutures), skin glue,   or adhesive strips in place. These skin closures may need to stay in place for 2 weeks or longer. If adhesive strip edges start to loosen and curl up, you may trim the loose edges. Do not remove adhesive strips completely unless your health care provider tells you to do that.  DO NOT use EMLA cream for 2 weeks after port placement as this cream will remove surgical glue on your  incision. °· Check your port insertion site every day for signs of infection. Check for: °? Redness, swelling, or pain. °? Fluid or blood. °? Warmth. °? Pus or a bad smell. °Activity °· Return to your normal activities as told by your health care provider. Ask your health care provider what activities are safe for you. °· Do not lift anything that is heavier than 10 lb (4.5 kg), or the limit that you are told, until your health care provider says that it is safe. °General instructions °· Take over-the-counter and prescription medicines only as told by your health care provider. °· Do not take baths, swim, or use a hot tub until your health care provider approves. Ask your health care provider if you may take showers. You may only be allowed to take sponge baths.  You may shower tomorrow. °· Do not drive for 24 hours if you were given a sedative during your procedure. °· Wear a medical alert bracelet in case of an emergency. This will tell any health care providers that you have a port. °· Keep all follow-up visits as told by your health care provider. This is important. °Contact a health care provider if: °· You cannot flush your port with saline as directed, or you cannot draw blood from the port. °· You have a fever or chills. °· You have redness, swelling, or pain around your port insertion site. °· You have fluid or blood coming from your port insertion site. °· Your port insertion site feels warm to the touch. °· You have pus or a bad smell coming from the port insertion site. °Get help right away if: °· You have chest pain or shortness of breath. °· You have bleeding from your port that you cannot control. °Summary °· Take care of the port as told by your health care provider. Keep the manufacturer's information card with you at all times. °· Change your dressing as told by your health care provider. °· Contact a health care provider if you have a fever or chills or if you have redness, swelling, or pain  around your port insertion site. °· Keep all follow-up visits as told by your health care provider. °This information is not intended to replace advice given to you by your health care provider. Make sure you discuss any questions you have with your health care provider. °Document Released: 10/07/2013 Document Revised: 07/15/2018 Document Reviewed: 07/15/2018 °Elsevier Patient Education © 2020 Elsevier Inc. ° °

## 2019-08-04 ENCOUNTER — Inpatient Hospital Stay: Payer: Medicare Other | Attending: Oncology

## 2019-08-04 ENCOUNTER — Telehealth: Payer: Self-pay | Admitting: *Deleted

## 2019-08-04 DIAGNOSIS — Z5111 Encounter for antineoplastic chemotherapy: Secondary | ICD-10-CM | POA: Insufficient documentation

## 2019-08-04 DIAGNOSIS — Z8546 Personal history of malignant neoplasm of prostate: Secondary | ICD-10-CM | POA: Insufficient documentation

## 2019-08-04 DIAGNOSIS — C679 Malignant neoplasm of bladder, unspecified: Secondary | ICD-10-CM | POA: Insufficient documentation

## 2019-08-04 DIAGNOSIS — N1330 Unspecified hydronephrosis: Secondary | ICD-10-CM | POA: Insufficient documentation

## 2019-08-04 DIAGNOSIS — Z79899 Other long term (current) drug therapy: Secondary | ICD-10-CM | POA: Insufficient documentation

## 2019-08-07 ENCOUNTER — Inpatient Hospital Stay: Payer: Medicare Other

## 2019-08-07 ENCOUNTER — Encounter: Payer: Self-pay | Admitting: Oncology

## 2019-08-07 ENCOUNTER — Other Ambulatory Visit: Payer: Self-pay

## 2019-08-07 VITALS — BP 146/74 | HR 95 | Temp 98.5°F | Resp 18

## 2019-08-07 DIAGNOSIS — Z95828 Presence of other vascular implants and grafts: Secondary | ICD-10-CM

## 2019-08-07 DIAGNOSIS — C679 Malignant neoplasm of bladder, unspecified: Secondary | ICD-10-CM

## 2019-08-07 DIAGNOSIS — N1330 Unspecified hydronephrosis: Secondary | ICD-10-CM | POA: Diagnosis not present

## 2019-08-07 DIAGNOSIS — Z5111 Encounter for antineoplastic chemotherapy: Secondary | ICD-10-CM | POA: Diagnosis present

## 2019-08-07 DIAGNOSIS — Z8546 Personal history of malignant neoplasm of prostate: Secondary | ICD-10-CM | POA: Diagnosis not present

## 2019-08-07 DIAGNOSIS — Z79899 Other long term (current) drug therapy: Secondary | ICD-10-CM | POA: Diagnosis not present

## 2019-08-07 LAB — CBC WITH DIFFERENTIAL (CANCER CENTER ONLY)
Abs Immature Granulocytes: 0.02 10*3/uL (ref 0.00–0.07)
Basophils Absolute: 0 10*3/uL (ref 0.0–0.1)
Basophils Relative: 1 %
Eosinophils Absolute: 0.1 10*3/uL (ref 0.0–0.5)
Eosinophils Relative: 2 %
HCT: 37 % — ABNORMAL LOW (ref 39.0–52.0)
Hemoglobin: 11.6 g/dL — ABNORMAL LOW (ref 13.0–17.0)
Immature Granulocytes: 0 %
Lymphocytes Relative: 14 %
Lymphs Abs: 0.8 10*3/uL (ref 0.7–4.0)
MCH: 28.2 pg (ref 26.0–34.0)
MCHC: 31.4 g/dL (ref 30.0–36.0)
MCV: 90 fL (ref 80.0–100.0)
Monocytes Absolute: 0.5 10*3/uL (ref 0.1–1.0)
Monocytes Relative: 8 %
Neutro Abs: 4.1 10*3/uL (ref 1.7–7.7)
Neutrophils Relative %: 75 %
Platelet Count: 162 10*3/uL (ref 150–400)
RBC: 4.11 MIL/uL — ABNORMAL LOW (ref 4.22–5.81)
RDW: 14.5 % (ref 11.5–15.5)
WBC Count: 5.5 10*3/uL (ref 4.0–10.5)
nRBC: 0 % (ref 0.0–0.2)

## 2019-08-07 LAB — CMP (CANCER CENTER ONLY)
ALT: 48 U/L — ABNORMAL HIGH (ref 0–44)
AST: 54 U/L — ABNORMAL HIGH (ref 15–41)
Albumin: 3.8 g/dL (ref 3.5–5.0)
Alkaline Phosphatase: 60 U/L (ref 38–126)
Anion gap: 17 — ABNORMAL HIGH (ref 5–15)
BUN: 18 mg/dL (ref 8–23)
CO2: 19 mmol/L — ABNORMAL LOW (ref 22–32)
Calcium: 9.7 mg/dL (ref 8.9–10.3)
Chloride: 104 mmol/L (ref 98–111)
Creatinine: 1.36 mg/dL — ABNORMAL HIGH (ref 0.61–1.24)
GFR, Est AFR Am: 59 mL/min — ABNORMAL LOW (ref >60)
GFR, Estimated: 51 mL/min — ABNORMAL LOW (ref >60)
Glucose, Bld: 273 mg/dL — ABNORMAL HIGH (ref 70–99)
Potassium: 3.9 mmol/L (ref 3.5–5.1)
Sodium: 140 mmol/L (ref 135–145)
Total Bilirubin: 0.4 mg/dL (ref 0.3–1.2)
Total Protein: 7.6 g/dL (ref 6.5–8.1)

## 2019-08-07 LAB — MAGNESIUM: Magnesium: 1.5 mg/dL — ABNORMAL LOW (ref 1.7–2.4)

## 2019-08-07 MED ORDER — SODIUM CHLORIDE 0.9 % IV SOLN
Freq: Once | INTRAVENOUS | Status: AC
  Administered 2019-08-07: 09:00:00 via INTRAVENOUS
  Filled 2019-08-07: qty 250

## 2019-08-07 MED ORDER — SODIUM CHLORIDE 0.9% FLUSH
10.0000 mL | INTRAVENOUS | Status: DC | PRN
  Administered 2019-08-07: 10 mL
  Filled 2019-08-07: qty 10

## 2019-08-07 MED ORDER — POTASSIUM CHLORIDE 2 MEQ/ML IV SOLN
Freq: Once | INTRAVENOUS | Status: AC
  Administered 2019-08-07: 10:00:00 via INTRAVENOUS
  Filled 2019-08-07: qty 10

## 2019-08-07 MED ORDER — PALONOSETRON HCL INJECTION 0.25 MG/5ML
0.2500 mg | Freq: Once | INTRAVENOUS | Status: AC
  Administered 2019-08-07: 0.25 mg via INTRAVENOUS

## 2019-08-07 MED ORDER — SODIUM CHLORIDE 0.9 % IV SOLN
70.0000 mg/m2 | Freq: Once | INTRAVENOUS | Status: AC
  Administered 2019-08-07: 166 mg via INTRAVENOUS
  Filled 2019-08-07: qty 166

## 2019-08-07 MED ORDER — SODIUM CHLORIDE 0.9 % IV SOLN
Freq: Once | INTRAVENOUS | Status: AC
  Administered 2019-08-07: 12:00:00 via INTRAVENOUS
  Filled 2019-08-07: qty 5

## 2019-08-07 MED ORDER — SODIUM CHLORIDE 0.9 % IV SOLN
1000.0000 mg/m2 | Freq: Once | INTRAVENOUS | Status: AC
  Administered 2019-08-07: 2356 mg via INTRAVENOUS
  Filled 2019-08-07: qty 61.96

## 2019-08-07 MED ORDER — HEPARIN SOD (PORK) LOCK FLUSH 100 UNIT/ML IV SOLN
500.0000 [IU] | Freq: Once | INTRAVENOUS | Status: AC | PRN
  Administered 2019-08-07: 500 [IU]
  Filled 2019-08-07: qty 5

## 2019-08-07 MED ORDER — PALONOSETRON HCL INJECTION 0.25 MG/5ML
INTRAVENOUS | Status: AC
  Filled 2019-08-07: qty 5

## 2019-08-07 NOTE — Progress Notes (Signed)
Checked with pt in infusion room today to see if he had any questions/concerns about education for CDDP/Gemzar.  He denies any & informed to call is anything comes up & he expressed understanding.

## 2019-08-07 NOTE — Patient Instructions (Signed)
[facility] Discharge Instructions for Patients Receiving Chemotherapy  Today you received the following chemotherapy agents Gemzar and Cisplatin  To help prevent nausea and vomiting after your treatment, we encourage you to take your nausea medication as directed.   If you develop nausea and vomiting that is not controlled by your nausea medication, call the clinic.   BELOW ARE SYMPTOMS THAT SHOULD BE REPORTED IMMEDIATELY:  *FEVER GREATER THAN 100.5 F  *CHILLS WITH OR WITHOUT FEVER  NAUSEA AND VOMITING THAT IS NOT CONTROLLED WITH YOUR NAUSEA MEDICATION  *UNUSUAL SHORTNESS OF BREATH  *UNUSUAL BRUISING OR BLEEDING  TENDERNESS IN MOUTH AND THROAT WITH OR WITHOUT PRESENCE OF ULCERS  *URINARY PROBLEMS  *BOWEL PROBLEMS  UNUSUAL RASH Items with * indicate a potential emergency and should be followed up as soon as possible.  Feel free to call the clinic should you have any questions or concerns. The clinic phone number is [phone].  Please show the [place] at check-in to the Emergency Department and triage nurse.  Gemcitabine (Gemzar) What is this medicine? GEMCITABINE (jem SYE ta been) is a chemotherapy drug. This medicine is used to treat many types of cancer like breast cancer, lung cancer, pancreatic cancer, and ovarian cancer. This medicine may be used for other purposes; ask your health care provider or pharmacist if you have questions. COMMON BRAND NAME(S): Gemzar, Infugem What should I tell my health care provider before I take this medicine? They need to know if you have any of these conditions:  blood disorders  infection  kidney disease  liver disease  lung or breathing disease, like asthma  recent or ongoing radiation therapy  an unusual or allergic reaction to gemcitabine, other chemotherapy, other medicines, foods, dyes, or preservatives  pregnant or trying to get pregnant  breast-feeding How should I use this  medicine? This drug is given as an infusion into a vein. It is administered in a hospital or clinic by a specially trained health care professional. Talk to your pediatrician regarding the use of this medicine in children. Special care may be needed. Overdosage: If you think you have taken too much of this medicine contact a poison control center or emergency room at once. NOTE: This medicine is only for you. Do not share this medicine with others. What if I miss a dose? It is important not to miss your dose. Call your doctor or health care professional if you are unable to keep an appointment. What may interact with this medicine?  medicines to increase blood counts like filgrastim, pegfilgrastim, sargramostim  some other chemotherapy drugs like cisplatin  vaccines Talk to your doctor or health care professional before taking any of these medicines:  acetaminophen  aspirin  ibuprofen  ketoprofen  naproxen This list may not describe all possible interactions. Give your health care provider a list of all the medicines, herbs, non-prescription drugs, or dietary supplements you use. Also tell them if you smoke, drink alcohol, or use illegal drugs. Some items may interact with your medicine. What should I watch for while using this medicine? Visit your doctor for checks on your progress. This drug may make you feel generally unwell. This is not uncommon, as chemotherapy can affect healthy cells as well as cancer cells. Report any side effects. Continue your course of treatment even though you feel ill unless your doctor tells you to stop. In some cases, you may be given additional medicines to help with side effects. Follow all directions for their  use. Call your doctor or health care professional for advice if you get a fever, chills or sore throat, or other symptoms of a cold or flu. Do not treat yourself. This drug decreases your body's ability to fight infections. Try to avoid being  around people who are sick. This medicine may increase your risk to bruise or bleed. Call your doctor or health care professional if you notice any unusual bleeding. Be careful brushing and flossing your teeth or using a toothpick because you may get an infection or bleed more easily. If you have any dental work done, tell your dentist you are receiving this medicine. Avoid taking products that contain aspirin, acetaminophen, ibuprofen, naproxen, or ketoprofen unless instructed by your doctor. These medicines may hide a fever. Do not become pregnant while taking this medicine or for 6 months after stopping it. Women should inform their doctor if they wish to become pregnant or think they might be pregnant. Men should not father a child while taking this medicine and for 3 months after stopping it. There is a potential for serious side effects to an unborn child. Talk to your health care professional or pharmacist for more information. Do not breast-feed an infant while taking this medicine or for at least 1 week after stopping it. Men should inform their doctors if they wish to father a child. This medicine may lower sperm counts. Talk with your doctor or health care professional if you are concerned about your fertility. What side effects may I notice from receiving this medicine? Side effects that you should report to your doctor or health care professional as soon as possible:  allergic reactions like skin rash, itching or hives, swelling of the face, lips, or tongue  breathing problems  pain, redness, or irritation at site where injected  signs and symptoms of a dangerous change in heartbeat or heart rhythm like chest pain; dizziness; fast or irregular heartbeat; palpitations; feeling faint or lightheaded, falls; breathing problems  signs of decreased platelets or bleeding - bruising, pinpoint red spots on the skin, black, tarry stools, blood in the urine  signs of decreased red blood cells -  unusually weak or tired, feeling faint or lightheaded, falls  signs of infection - fever or chills, cough, sore throat, pain or difficulty passing urine  signs and symptoms of kidney injury like trouble passing urine or change in the amount of urine  signs and symptoms of liver injury like dark yellow or brown urine; general ill feeling or flu-like symptoms; light-colored stools; loss of appetite; nausea; right upper belly pain; unusually weak or tired; yellowing of the eyes or skin  swelling of ankles, feet, hands Side effects that usually do not require medical attention (report to your doctor or health care professional if they continue or are bothersome):  constipation  diarrhea  hair loss  loss of appetite  nausea  rash  vomiting This list may not describe all possible side effects. Call your doctor for medical advice about side effects. You may report side effects to FDA at 1-800-FDA-1088. Where should I keep my medicine? This drug is given in a hospital or clinic and will not be stored at home. NOTE: This sheet is a summary. It may not cover all possible information. If you have questions about this medicine, talk to your doctor, pharmacist, or health care provider.  2020 Elsevier/Gold Standard (2018-03-12 18:06:11)  Cisplatin  What is this medicine? CISPLATIN (SIS pla tin) is a chemotherapy drug. It targets fast dividing  cells, like cancer cells, and causes these cells to die. This medicine is used to treat many types of cancer like bladder, ovarian, and testicular cancers. This medicine may be used for other purposes; ask your health care provider or pharmacist if you have questions. COMMON BRAND NAME(S): Platinol, Platinol -AQ What should I tell my health care provider before I take this medicine? They need to know if you have any of these conditions:  blood disorders  hearing problems  kidney disease  recent or ongoing radiation therapy  an unusual or  allergic reaction to cisplatin, carboplatin, other chemotherapy, other medicines, foods, dyes, or preservatives  pregnant or trying to get pregnant  breast-feeding How should I use this medicine? This drug is given as an infusion into a vein. It is administered in a hospital or clinic by a specially trained health care professional. Talk to your pediatrician regarding the use of this medicine in children. Special care may be needed. Overdosage: If you think you have taken too much of this medicine contact a poison control center or emergency room at once. NOTE: This medicine is only for you. Do not share this medicine with others. What if I miss a dose? It is important not to miss a dose. Call your doctor or health care professional if you are unable to keep an appointment. What may interact with this medicine?  dofetilide  foscarnet  medicines for seizures  medicines to increase blood counts like filgrastim, pegfilgrastim, sargramostim  probenecid  pyridoxine used with altretamine  rituximab  some antibiotics like amikacin, gentamicin, neomycin, polymyxin B, streptomycin, tobramycin  sulfinpyrazone  vaccines  zalcitabine Talk to your doctor or health care professional before taking any of these medicines:  acetaminophen  aspirin  ibuprofen  ketoprofen  naproxen This list may not describe all possible interactions. Give your health care provider a list of all the medicines, herbs, non-prescription drugs, or dietary supplements you use. Also tell them if you smoke, drink alcohol, or use illegal drugs. Some items may interact with your medicine. What should I watch for while using this medicine? Your condition will be monitored carefully while you are receiving this medicine. You will need important blood work done while you are taking this medicine. This drug may make you feel generally unwell. This is not uncommon, as chemotherapy can affect healthy cells as well as  cancer cells. Report any side effects. Continue your course of treatment even though you feel ill unless your doctor tells you to stop. In some cases, you may be given additional medicines to help with side effects. Follow all directions for their use. Call your doctor or health care professional for advice if you get a fever, chills or sore throat, or other symptoms of a cold or flu. Do not treat yourself. This drug decreases your body's ability to fight infections. Try to avoid being around people who are sick. This medicine may increase your risk to bruise or bleed. Call your doctor or health care professional if you notice any unusual bleeding. Be careful brushing and flossing your teeth or using a toothpick because you may get an infection or bleed more easily. If you have any dental work done, tell your dentist you are receiving this medicine. Avoid taking products that contain aspirin, acetaminophen, ibuprofen, naproxen, or ketoprofen unless instructed by your doctor. These medicines may hide a fever. Do not become pregnant while taking this medicine. Women should inform their doctor if they wish to become pregnant or think they  might be pregnant. There is a potential for serious side effects to an unborn child. Talk to your health care professional or pharmacist for more information. Do not breast-feed an infant while taking this medicine. Drink fluids as directed while you are taking this medicine. This will help protect your kidneys. Call your doctor or health care professional if you get diarrhea. Do not treat yourself. What side effects may I notice from receiving this medicine? Side effects that you should report to your doctor or health care professional as soon as possible:  allergic reactions like skin rash, itching or hives, swelling of the face, lips, or tongue  signs of infection - fever or chills, cough, sore throat, pain or difficulty passing urine  signs of decreased platelets  or bleeding - bruising, pinpoint red spots on the skin, black, tarry stools, nosebleeds  signs of decreased red blood cells - unusually weak or tired, fainting spells, lightheadedness  breathing problems  changes in hearing  gout pain  low blood counts - This drug may decrease the number of white blood cells, red blood cells and platelets. You may be at increased risk for infections and bleeding.  nausea and vomiting  pain, swelling, redness or irritation at the injection site  pain, tingling, numbness in the hands or feet  problems with balance, movement  trouble passing urine or change in the amount of urine Side effects that usually do not require medical attention (report to your doctor or health care professional if they continue or are bothersome):  changes in vision  loss of appetite  metallic taste in the mouth or changes in taste This list may not describe all possible side effects. Call your doctor for medical advice about side effects. You may report side effects to FDA at 1-800-FDA-1088. Where should I keep my medicine? This drug is given in a hospital or clinic and will not be stored at home. NOTE: This sheet is a summary. It may not cover all possible information. If you have questions about this medicine, talk to your doctor, pharmacist, or health care provider.  2020 Elsevier/Gold Standard (2008-03-23 14:40:54)   Coronavirus (COVID-19) Are you at risk?  Are you at risk for the Coronavirus (COVID-19)?  To be considered HIGH RISK for Coronavirus (COVID-19), you have to meet the following criteria:  . Traveled to [place], [place], [place], [place] or [place]; or in the [place] to [place], [place], [place], or [place]; and have fever, cough, and shortness of breath within the last 2 weeks of travel OR . Been in close contact with a person diagnosed with COVID-19 within the last 2 weeks and have fever, cough, and shortness of breath . IF YOU DO NOT  MEET THESE CRITERIA, YOU ARE CONSIDERED LOW RISK FOR COVID-19.  What to do if you are HIGH RISK for COVID-19?  [name] If you are having a medical emergency, call 911. . Seek medical care right away. Before you go to a doctor's office, urgent care or emergency department, call ahead and tell them about your recent travel, contact with someone diagnosed with COVID-19, and your symptoms. You should receive instructions from your physician's office regarding next steps of care.  . When you arrive at healthcare provider, tell the healthcare staff immediately you have returned from visiting [place], [place], [place], [place] or [place]; or traveled in the [place] to [place], [place], [place], or [place]; in the last two weeks or you have been in close  contact with a person diagnosed with COVID-19 in the last 2 weeks.   . Tell the health care staff about your symptoms: fever, cough and shortness of breath. . After you have been seen by a medical provider, you will be either: o Tested for (COVID-19) and discharged home on quarantine except to seek medical care if symptoms worsen, and asked to  - Stay home and avoid contact with others until you get your results (4-5 days)  - Avoid travel on public transportation if possible (such as bus, train, or airplane) or o Sent to the Emergency Department by EMS for evaluation, COVID-19 testing, and possible admission depending on your condition and test results.  What to do if you are LOW RISK for COVID-19?  Reduce your risk of any infection by using the same precautions used for avoiding the common cold or flu:  [name] Wash your hands often with soap and warm water for at least 20 seconds.  If soap and water are not readily available, use an alcohol-based hand sanitizer with at least 60% alcohol.  . If coughing or sneezing, cover your mouth and nose by coughing or sneezing into the elbow areas of your shirt or coat, into a tissue or into your sleeve (not your  hands). . Avoid shaking hands with others and consider head nods or verbal greetings only. . Avoid touching your eyes, nose, or mouth with unwashed hands.  . Avoid close contact with people who are sick. . Avoid places or events with large numbers of people in one location, like concerts or sporting events. . Carefully consider travel plans you have or are making. . If you are planning any travel outside or inside the [place], visit the CDC's Travelers' Health webpage for the latest health notices. . If you have some symptoms but not all symptoms, continue to monitor at home and seek medical attention if your symptoms worsen. . If you are having a medical emergency, call 911.   [place] / e-Visit: [email or web address]         [facility]: [facility]: [phone]                   [facility]: [phone]

## 2019-08-10 ENCOUNTER — Telehealth: Payer: Self-pay | Admitting: *Deleted

## 2019-08-10 NOTE — Telephone Encounter (Signed)
Called pt to check on how he did after chemotherapy.  Spoke with his wife.  She states pt did well so far except some nausea yesterday & blood sugar elevated day after treatment.  She states that he is eating but less than his normal & she is trying to make sure that he drinks plenty. She is also working on proteins.  She states bowels are no different than his normal.  Discussed watching concentrated sweets for few days after treatment.  Informed to call with any questions or concerns. She expressed understanding.

## 2019-08-10 NOTE — Telephone Encounter (Signed)
-----   Message from Royston Bake, RN sent at 08/07/2019  3:42 PM EDT ----- Regarding: Dr. Alen Blew patient first time Gemzar Cisplatin Dr. Alen Blew patient first time Gemzar Cisplatin

## 2019-08-11 ENCOUNTER — Encounter: Payer: Self-pay | Admitting: Family Medicine

## 2019-08-14 ENCOUNTER — Inpatient Hospital Stay: Payer: Medicare Other

## 2019-08-14 ENCOUNTER — Other Ambulatory Visit: Payer: Self-pay

## 2019-08-14 VITALS — BP 137/78 | HR 96 | Temp 99.1°F | Resp 18

## 2019-08-14 DIAGNOSIS — C679 Malignant neoplasm of bladder, unspecified: Secondary | ICD-10-CM

## 2019-08-14 DIAGNOSIS — Z95828 Presence of other vascular implants and grafts: Secondary | ICD-10-CM

## 2019-08-14 DIAGNOSIS — Z5111 Encounter for antineoplastic chemotherapy: Secondary | ICD-10-CM | POA: Diagnosis not present

## 2019-08-14 LAB — CBC WITH DIFFERENTIAL (CANCER CENTER ONLY)
Abs Immature Granulocytes: 0 10*3/uL (ref 0.00–0.07)
Basophils Absolute: 0 10*3/uL (ref 0.0–0.1)
Basophils Relative: 1 %
Eosinophils Absolute: 0 10*3/uL (ref 0.0–0.5)
Eosinophils Relative: 1 %
HCT: 36.6 % — ABNORMAL LOW (ref 39.0–52.0)
Hemoglobin: 11.7 g/dL — ABNORMAL LOW (ref 13.0–17.0)
Immature Granulocytes: 0 %
Lymphocytes Relative: 25 %
Lymphs Abs: 0.7 10*3/uL (ref 0.7–4.0)
MCH: 28.2 pg (ref 26.0–34.0)
MCHC: 32 g/dL (ref 30.0–36.0)
MCV: 88.2 fL (ref 80.0–100.0)
Monocytes Absolute: 0.2 10*3/uL (ref 0.1–1.0)
Monocytes Relative: 7 %
Neutro Abs: 1.9 10*3/uL (ref 1.7–7.7)
Neutrophils Relative %: 66 %
Platelet Count: 118 10*3/uL — ABNORMAL LOW (ref 150–400)
RBC: 4.15 MIL/uL — ABNORMAL LOW (ref 4.22–5.81)
RDW: 14.1 % (ref 11.5–15.5)
WBC Count: 2.9 10*3/uL — ABNORMAL LOW (ref 4.0–10.5)
nRBC: 0 % (ref 0.0–0.2)

## 2019-08-14 LAB — CMP (CANCER CENTER ONLY)
ALT: 75 U/L — ABNORMAL HIGH (ref 0–44)
AST: 67 U/L — ABNORMAL HIGH (ref 15–41)
Albumin: 3.9 g/dL (ref 3.5–5.0)
Alkaline Phosphatase: 62 U/L (ref 38–126)
Anion gap: 14 (ref 5–15)
BUN: 27 mg/dL — ABNORMAL HIGH (ref 8–23)
CO2: 21 mmol/L — ABNORMAL LOW (ref 22–32)
Calcium: 9.6 mg/dL (ref 8.9–10.3)
Chloride: 106 mmol/L (ref 98–111)
Creatinine: 1.57 mg/dL — ABNORMAL HIGH (ref 0.61–1.24)
GFR, Est AFR Am: 50 mL/min — ABNORMAL LOW (ref >60)
GFR, Estimated: 43 mL/min — ABNORMAL LOW (ref >60)
Glucose, Bld: 186 mg/dL — ABNORMAL HIGH (ref 70–99)
Potassium: 4 mmol/L (ref 3.5–5.1)
Sodium: 141 mmol/L (ref 135–145)
Total Bilirubin: 0.3 mg/dL (ref 0.3–1.2)
Total Protein: 7.7 g/dL (ref 6.5–8.1)

## 2019-08-14 LAB — MAGNESIUM: Magnesium: 1.5 mg/dL — ABNORMAL LOW (ref 1.7–2.4)

## 2019-08-14 MED ORDER — SODIUM CHLORIDE 0.9 % IV SOLN
Freq: Once | INTRAVENOUS | Status: AC
  Administered 2019-08-14: 14:00:00 via INTRAVENOUS
  Filled 2019-08-14: qty 250

## 2019-08-14 MED ORDER — PROCHLORPERAZINE MALEATE 10 MG PO TABS
ORAL_TABLET | ORAL | Status: AC
  Filled 2019-08-14: qty 1

## 2019-08-14 MED ORDER — HEPARIN SOD (PORK) LOCK FLUSH 100 UNIT/ML IV SOLN
500.0000 [IU] | Freq: Once | INTRAVENOUS | Status: AC | PRN
  Administered 2019-08-14: 500 [IU]
  Filled 2019-08-14: qty 5

## 2019-08-14 MED ORDER — SODIUM CHLORIDE 0.9 % IV SOLN
1000.0000 mg/m2 | Freq: Once | INTRAVENOUS | Status: AC
  Administered 2019-08-14: 2356 mg via INTRAVENOUS
  Filled 2019-08-14: qty 61.96

## 2019-08-14 MED ORDER — SODIUM CHLORIDE 0.9% FLUSH
10.0000 mL | INTRAVENOUS | Status: DC | PRN
  Administered 2019-08-14: 10 mL
  Filled 2019-08-14: qty 10

## 2019-08-14 MED ORDER — SODIUM CHLORIDE 0.9% FLUSH
10.0000 mL | INTRAVENOUS | Status: DC | PRN
  Administered 2019-08-14: 13:00:00 10 mL
  Filled 2019-08-14: qty 10

## 2019-08-14 MED ORDER — PROCHLORPERAZINE MALEATE 10 MG PO TABS
10.0000 mg | ORAL_TABLET | Freq: Once | ORAL | Status: AC
  Administered 2019-08-14: 10 mg via ORAL

## 2019-08-14 NOTE — Progress Notes (Signed)
Creatinine 1.57 reported to Dr. Alen Blew. Received OK to treat.

## 2019-08-14 NOTE — Patient Instructions (Signed)

## 2019-08-14 NOTE — Patient Instructions (Signed)
[facility] °Discharge Instructions for Patients Receiving Chemotherapy ° °Today you received the following chemotherapy agents Gemzar ° °To help prevent nausea and vomiting after your treatment, we encourage you to take your nausea medication as directed. °  °If you develop nausea and vomiting that is not controlled by your nausea medication, call the clinic.  ° °BELOW ARE SYMPTOMS THAT SHOULD BE REPORTED IMMEDIATELY: °· *FEVER GREATER THAN 100.5 F °· *CHILLS WITH OR WITHOUT FEVER °· NAUSEA AND VOMITING THAT IS NOT CONTROLLED WITH YOUR NAUSEA MEDICATION °· *UNUSUAL SHORTNESS OF BREATH °· *UNUSUAL BRUISING OR BLEEDING °· TENDERNESS IN MOUTH AND THROAT WITH OR WITHOUT PRESENCE OF ULCERS °· *URINARY PROBLEMS °· *BOWEL PROBLEMS °· UNUSUAL RASH °Items with * indicate a potential emergency and should be followed up as soon as possible. ° °Feel free to call the clinic should you have any questions or concerns. The clinic phone number is [phone]. ° °Please show the CHEMO ALERT CARD at check-in to the Emergency Department and triage nurse. ° ° °

## 2019-08-18 ENCOUNTER — Telehealth: Payer: Self-pay

## 2019-08-18 NOTE — Telephone Encounter (Signed)
Called and let patient's wife know that per Dr. Alen Blew continue to monitor patient for now. She verbalized understanding and will call the office with any further questions or concerns.

## 2019-08-18 NOTE — Telephone Encounter (Signed)
-----   Message from Wyatt Portela, MD sent at 08/18/2019  2:38 PM EDT ----- Just monitor for now. Thanks.  ----- Message ----- From: Tami Lin, RN Sent: 08/18/2019   2:11 PM EDT To: Wyatt Portela, MD  Per wife,patient has had a red rash on legs, stomach, and chest since Sunday. Rash is not painful and does not itch. Rash is now "bluish and red" per wife. Complains of fatigue and loose stools. Appetite is ok. [name] just wanted to make you aware. Next infusion appointment is 08/27/19. Gemzar and Cisplatin.  Lanelle Bal

## 2019-08-19 ENCOUNTER — Telehealth: Payer: Self-pay | Admitting: Family Medicine

## 2019-08-19 NOTE — Telephone Encounter (Signed)
Yes

## 2019-08-19 NOTE — Telephone Encounter (Signed)
Pt wife was advised [facility]

## 2019-08-19 NOTE — Telephone Encounter (Signed)
  [name] called to see if Ed should still get flu injection with everything he has going on

## 2019-08-20 ENCOUNTER — Other Ambulatory Visit (INDEPENDENT_AMBULATORY_CARE_PROVIDER_SITE_OTHER): Payer: Medicare Other

## 2019-08-20 ENCOUNTER — Other Ambulatory Visit: Payer: Self-pay

## 2019-08-20 DIAGNOSIS — Z23 Encounter for immunization: Secondary | ICD-10-CM

## 2019-08-27 ENCOUNTER — Other Ambulatory Visit: Payer: Self-pay

## 2019-08-27 ENCOUNTER — Inpatient Hospital Stay: Payer: Medicare Other

## 2019-08-27 ENCOUNTER — Inpatient Hospital Stay: Payer: Medicare Other | Admitting: Oncology

## 2019-08-27 VITALS — BP 145/81 | HR 92 | Temp 99.1°F | Resp 18 | Ht 68.0 in | Wt 257.7 lb

## 2019-08-27 DIAGNOSIS — Z5111 Encounter for antineoplastic chemotherapy: Secondary | ICD-10-CM | POA: Diagnosis not present

## 2019-08-27 DIAGNOSIS — C679 Malignant neoplasm of bladder, unspecified: Secondary | ICD-10-CM | POA: Diagnosis not present

## 2019-08-27 LAB — CBC WITH DIFFERENTIAL (CANCER CENTER ONLY)
Abs Immature Granulocytes: 0.04 10*3/uL (ref 0.00–0.07)
Basophils Absolute: 0 10*3/uL (ref 0.0–0.1)
Basophils Relative: 1 %
Eosinophils Absolute: 0.1 10*3/uL (ref 0.0–0.5)
Eosinophils Relative: 2 %
HCT: 35.2 % — ABNORMAL LOW (ref 39.0–52.0)
Hemoglobin: 11.3 g/dL — ABNORMAL LOW (ref 13.0–17.0)
Immature Granulocytes: 1 %
Lymphocytes Relative: 24 %
Lymphs Abs: 0.7 10*3/uL (ref 0.7–4.0)
MCH: 28.5 pg (ref 26.0–34.0)
MCHC: 32.1 g/dL (ref 30.0–36.0)
MCV: 88.7 fL (ref 80.0–100.0)
Monocytes Absolute: 0.5 10*3/uL (ref 0.1–1.0)
Monocytes Relative: 18 %
Neutro Abs: 1.6 10*3/uL — ABNORMAL LOW (ref 1.7–7.7)
Neutrophils Relative %: 54 %
Platelet Count: 312 10*3/uL (ref 150–400)
RBC: 3.97 MIL/uL — ABNORMAL LOW (ref 4.22–5.81)
RDW: 15.9 % — ABNORMAL HIGH (ref 11.5–15.5)
WBC Count: 3 10*3/uL — ABNORMAL LOW (ref 4.0–10.5)
nRBC: 0 % (ref 0.0–0.2)

## 2019-08-27 LAB — CMP (CANCER CENTER ONLY)
ALT: 25 U/L (ref 0–44)
AST: 28 U/L (ref 15–41)
Albumin: 3.9 g/dL (ref 3.5–5.0)
Alkaline Phosphatase: 67 U/L (ref 38–126)
Anion gap: 11 (ref 5–15)
BUN: 11 mg/dL (ref 8–23)
CO2: 24 mmol/L (ref 22–32)
Calcium: 9.6 mg/dL (ref 8.9–10.3)
Chloride: 103 mmol/L (ref 98–111)
Creatinine: 1.16 mg/dL (ref 0.61–1.24)
GFR, Est AFR Am: >60 mL/min (ref >60)
GFR, Estimated: >60 mL/min (ref >60)
Glucose, Bld: 152 mg/dL — ABNORMAL HIGH (ref 70–99)
Potassium: 4.2 mmol/L (ref 3.5–5.1)
Sodium: 138 mmol/L (ref 135–145)
Total Bilirubin: 0.3 mg/dL (ref 0.3–1.2)
Total Protein: 7.4 g/dL (ref 6.5–8.1)

## 2019-08-27 LAB — MAGNESIUM: Magnesium: 1.5 mg/dL — ABNORMAL LOW (ref 1.7–2.4)

## 2019-08-27 NOTE — Progress Notes (Signed)
Hematology and Oncology Follow Up Visit  Jimmy [name] [id number] 1946-03-15 73 y.o. 08/27/2019 9:51 AM Jimmy [name], MDLalonde, Jimmy Jarvis, MD   Principle Diagnosis: 73 year old man with T2N0 high-grade urothelial carcinoma of the bladder diagnosed in June 2020.   Prior Therapy: TURBT with right ureteral stent placement as well as left ureteral stent placement at that time.  The final pathology showed infiltrating high-grade papillary adenocarcinoma invading into the detrusor muscle with lymphovascular invasion identified.  This was completed on 06/22/2019.   Current therapy: Neoadjuvant chemotherapy utilizing cisplatin and gemcitabine started on August 07, 2019.  He is here for evaluation prior to cycle 2.  Interim History: Jimmy [name] returns today for a repeat evaluation.  Since the last visit, he completed be first cycle of chemotherapy without any major complications.  He does report some fatigue and tiredness which has improved shortly after the conclusion of day 1 of chemotherapy.  He denies any vomiting but did report some mild nausea.  He denies any diarrhea or worsening neuropathy.  He denies any decline in his performance status.  Quality of life remain excellent.  Patient denied any alteration mental status, neuropathy, confusion or dizziness.  Denies any headaches or lethargy.  Denies any night sweats, weight loss or changes in appetite.  Denied orthopnea, dyspnea on exertion or chest discomfort.  Denies shortness of breath, difficulty breathing hemoptysis or cough.  Denies any abdominal distention, nausea, early satiety or dyspepsia.  Denies any hematuria, frequency, dysuria or nocturia.  Denies any skin irritation, dryness or rash.  Denies any ecchymosis or petechiae.  Denies any lymphadenopathy or clotting.  Denies any heat or cold intolerance.  Denies any anxiety or depression.  Remaining review of system is negative.        Medications: I have reviewed the patient's current  medications.  Current Outpatient Medications  Medication Sig Dispense Refill  . B-D UF III MINI PEN NEEDLES 31G X 5 MM MISC USE TO INJECT INSULIN EVERY DAY 100 each 5  . blood glucose meter kit and supplies Dispense One touch ultra meter. E11.9 1 each 0  . cholecalciferol (VITAMIN D) 1000 UNITS tablet Take 2,000 Units by mouth daily.     . Coenzyme Q10 (COQ10 PO) Take 300 mg by mouth daily.     [name] Ethyl (VASCEPA) 1 g CAPS Take 2 capsules (2 g total) by mouth 2 (two) times a day. 360 capsule 3  . lidocaine-prilocaine (EMLA) cream Apply 1 application topically as needed. 30 g 0  . liraglutide (VICTOZA) 18 MG/3ML SOPN Inject 0.3 mLs (1.8 mg total) into the skin daily. 9 pen 1  . losartan (COZAAR) 100 MG tablet Take 1 tablet (100 mg total) by mouth daily. 90 tablet 3  . Magnesium 250 MG TABS Take 250 mg by mouth daily.     . metFORMIN (GLUCOPHAGE) 1000 MG tablet TAKE 1 TABLET BY MOUTH TWICE A DAY WITH A MEAL (Patient taking differently: Take 1,000 mg by mouth 2 (two) times daily with a meal. TAKE 1 TABLET BY MOUTH TWICE A DAY WITH A MEAL) 180 tablet 1  . Multiple Vitamins-Minerals (MULTIVITAMIN WITH MINERALS) tablet Take 1 tablet by mouth daily.      . ONE TOUCH ULTRA TEST test strip USE TO TEST BLOOD SUGAR DAILY AS DIRECTED BY DOCTOR 100 each 3  . ONETOUCH DELICA LANCETS 51V MISC TEST ONCE DAILY AS DIRECTED 100 each 2  . ONETOUCH DELICA LANCETS 61Y MISC USE AS DIRECTED TO TEST BLOOD SUGAR  EVERY DAY 100 each 2  . phenazopyridine (PYRIDIUM) 200 MG tablet Take 1 tablet (200 mg total) by mouth 3 (three) times daily as needed for pain. 10 tablet 0  . Pitavastatin Calcium (LIVALO) 4 MG TABS Take 1 tablet (4 mg total) by mouth daily. 90 tablet 3  . prochlorperazine (COMPAZINE) 10 MG tablet Take 1 tablet (10 mg total) by mouth every 6 (six) hours as needed for nausea or vomiting. 30 tablet 0  . traMADol (ULTRAM) 50 MG tablet Take 1-2 tablets (50-100 mg total) by mouth every 6 (six) hours as needed  (pain). 15 tablet 0   No current facility-administered medications for this visit.      Allergies:  Allergies  Allergen Reactions  . Ace Inhibitors Cough  . Cephalexin   . Crestor [Rosuvastatin Calcium]   . Lipitor [Atorvastatin Calcium] Other (See Comments)    MUSCLE PAINS   . Plavix [Clopidogrel Bisulfate] Hives    Past Medical History, Surgical history, Social history, and Family History were reviewed and updated.    Physical Exam: Blood pressure (!) 145/81, pulse 92, temperature 99.1 F (37.3 C), temperature source Oral, resp. rate 18, height '5\' 8"'  (1.727 m), weight 257 lb 11.2 oz (116.9 kg), SpO2 96 %.   ECOG: 0   General appearance: Comfortable appearing without any discomfort Head: Normocephalic without any trauma Oropharynx: Mucous membranes are moist and pink without any thrush or ulcers. Eyes: Pupils are equal and round reactive to light. Lymph nodes: No cervical, supraclavicular, inguinal or axillary lymphadenopathy.   Heart:regular rate and rhythm.  S1 and S2 without leg edema. [name]: Clear without any rhonchi or wheezes.  No dullness to percussion. Abdomin: Soft, nontender, nondistended with good bowel sounds.  No hepatosplenomegaly. Musculoskeletal: No joint deformity or effusion.  Full range of motion noted. Neurological: No deficits noted on motor, sensory and deep tendon reflex exam. Skin: No petechial rash or dryness.  Appeared moist.      Lab Results: Lab Results  Component Value Date   WBC 3.0 (L) 08/27/2019   HGB 11.3 (L) 08/27/2019   HCT 35.2 (L) 08/27/2019   MCV 88.7 08/27/2019   PLT 312 08/27/2019     Chemistry      Component Value Date/Time   NA 141 08/14/2019 1238   NA 141 06/03/2019 1157   K 4.0 08/14/2019 1238   CL 106 08/14/2019 1238   CO2 21 (L) 08/14/2019 1238   BUN 27 (H) 08/14/2019 1238   BUN 19 06/03/2019 1157   CREATININE 1.57 (H) 08/14/2019 1238   CREATININE 0.89 11/19/2016 0920      Component Value Date/Time    CALCIUM 9.6 08/14/2019 1238   ALKPHOS 62 08/14/2019 1238   AST 67 (H) 08/14/2019 1238   ALT 75 (H) 08/14/2019 1238   BILITOT 0.3 08/14/2019 1238        Impression and Plan:  1.    T2N0 urothelial carcinoma of the bladder iagnosed in June 2020.     He is receiving neoadjuvant chemotherapy utilizing gemcitabine and cisplatin and has tolerated the first cycle without any major complications.  Risks and benefits of continuing this treatment to complete 3-4 cycles were reviewed.  These complications including nausea, fatigue, myelosuppression and renal insufficiency.  At this time he is agreeable to continue.  He will proceed with day 1 of cycle 2 on 08/28/2019.   2. IV access:Port-A-Cath inserted without any complications and will continue to be in use.  3. Antiemetics: Compazine has been effective in  treating his nausea without any vomiting.  4. Renal function surveillance:His creatinine clearance continues to fluctuate between 45 to 50 cc/min.  We will continue to monitor and adjust cisplatin dosing as needed.   5. Goals of care:  His disease is incurable and aggressive measures are warranted at this time..  6.  Fatigue: Related to chemotherapy and malignancy and some of the symptoms have proceeded start of chemotherapy.  7.  Prostate cancer: Remains in remission without any evidence of relapse at this time.  He was treated in 2009.  8. Follow-up:  On 08/28/2019 to start cycle 2 and in 3 weeks for the beginning of cycle 3.  25  minutes was spent with the patient face-to-face today.  More than 50% of time was spent on updating his disease status, treatment options, complications related to therapy and answering questions regarding future plan of care.Zola Button, MD 8/27/20209:51 AM

## 2019-08-28 ENCOUNTER — Telehealth: Payer: Self-pay | Admitting: Oncology

## 2019-08-28 ENCOUNTER — Inpatient Hospital Stay: Payer: Medicare Other

## 2019-08-28 ENCOUNTER — Other Ambulatory Visit: Payer: Self-pay

## 2019-08-28 VITALS — BP 159/69 | HR 99 | Temp 98.5°F | Resp 16

## 2019-08-28 DIAGNOSIS — Z5111 Encounter for antineoplastic chemotherapy: Secondary | ICD-10-CM | POA: Diagnosis not present

## 2019-08-28 DIAGNOSIS — C679 Malignant neoplasm of bladder, unspecified: Secondary | ICD-10-CM

## 2019-08-28 MED ORDER — SODIUM CHLORIDE 0.9 % IV SOLN
Freq: Once | INTRAVENOUS | Status: AC
  Administered 2019-08-28: 08:00:00 via INTRAVENOUS
  Filled 2019-08-28: qty 250

## 2019-08-28 MED ORDER — SODIUM CHLORIDE 0.9% FLUSH
10.0000 mL | INTRAVENOUS | Status: DC | PRN
  Administered 2019-08-28: 16:00:00 10 mL
  Filled 2019-08-28: qty 10

## 2019-08-28 MED ORDER — SODIUM CHLORIDE 0.9 % IV SOLN
1000.0000 mg/m2 | Freq: Once | INTRAVENOUS | Status: AC
  Administered 2019-08-28: 12:00:00 2356 mg via INTRAVENOUS
  Filled 2019-08-28: qty 61.96

## 2019-08-28 MED ORDER — SODIUM CHLORIDE 0.9 % IV SOLN
50.0000 mg/m2 | Freq: Once | INTRAVENOUS | Status: AC
  Administered 2019-08-28: 13:00:00 119 mg via INTRAVENOUS
  Filled 2019-08-28: qty 119

## 2019-08-28 MED ORDER — PALONOSETRON HCL INJECTION 0.25 MG/5ML
0.2500 mg | Freq: Once | INTRAVENOUS | Status: AC
  Administered 2019-08-28: 11:00:00 0.25 mg via INTRAVENOUS

## 2019-08-28 MED ORDER — SODIUM CHLORIDE 0.9 % IV SOLN
Freq: Once | INTRAVENOUS | Status: AC
  Administered 2019-08-28: 11:00:00 via INTRAVENOUS
  Filled 2019-08-28: qty 5

## 2019-08-28 MED ORDER — PALONOSETRON HCL INJECTION 0.25 MG/5ML
INTRAVENOUS | Status: AC
  Filled 2019-08-28: qty 5

## 2019-08-28 MED ORDER — HEPARIN SOD (PORK) LOCK FLUSH 100 UNIT/ML IV SOLN
500.0000 [IU] | Freq: Once | INTRAVENOUS | Status: AC | PRN
  Administered 2019-08-28: 16:00:00 500 [IU]
  Filled 2019-08-28: qty 5

## 2019-08-28 MED ORDER — POTASSIUM CHLORIDE 2 MEQ/ML IV SOLN
Freq: Once | INTRAVENOUS | Status: AC
  Administered 2019-08-28: 09:00:00 via INTRAVENOUS
  Filled 2019-08-28: qty 10

## 2019-08-28 NOTE — Patient Instructions (Signed)
Cancer Center Discharge Instructions for Patients Receiving Chemotherapy  Today you received the following chemotherapy agents Gemzar and Cisplatin  To help prevent nausea and vomiting after your treatment, we encourage you to take your nausea medication as directed   If you develop nausea and vomiting that is not controlled by your nausea medication, call the clinic.   BELOW ARE SYMPTOMS THAT SHOULD BE REPORTED IMMEDIATELY:  *FEVER GREATER THAN 100.5 F  *CHILLS WITH OR WITHOUT FEVER  NAUSEA AND VOMITING THAT IS NOT CONTROLLED WITH YOUR NAUSEA MEDICATION  *UNUSUAL SHORTNESS OF BREATH  *UNUSUAL BRUISING OR BLEEDING  TENDERNESS IN MOUTH AND THROAT WITH OR WITHOUT PRESENCE OF ULCERS  *URINARY PROBLEMS  *BOWEL PROBLEMS  UNUSUAL RASH Items with * indicate a potential emergency and should be followed up as soon as possible.  Feel free to call the clinic should you have any questions or concerns. The clinic phone number is [phone].  Please show the CHEMO ALERT CARD at check-in to the Emergency Department and triage nurse.   

## 2019-08-28 NOTE — Progress Notes (Signed)
Per Dr. Alen Blew, ok to proceed with chemotherapy with 185mL of urine output.

## 2019-08-28 NOTE — Progress Notes (Signed)
Spoke w/ Dr. Alen Blew - increase magnesium in cisplatin fluids to 3 grams today for level of 1.5.   Demetrius Charity, PharmD, [facility] Pharmacist Pharmacy Phone: [phone] 08/28/2019

## 2019-08-28 NOTE — Telephone Encounter (Signed)
Called and spoke with patients wife. Confirmed upcoming appts  

## 2019-09-04 ENCOUNTER — Inpatient Hospital Stay: Payer: Medicare Other | Attending: Oncology

## 2019-09-04 ENCOUNTER — Telehealth: Payer: Self-pay | Admitting: *Deleted

## 2019-09-04 ENCOUNTER — Other Ambulatory Visit: Payer: Self-pay

## 2019-09-04 ENCOUNTER — Inpatient Hospital Stay: Payer: Medicare Other

## 2019-09-04 ENCOUNTER — Telehealth: Payer: Self-pay

## 2019-09-04 VITALS — BP 154/81 | HR 87 | Temp 98.9°F | Resp 16

## 2019-09-04 DIAGNOSIS — C679 Malignant neoplasm of bladder, unspecified: Secondary | ICD-10-CM

## 2019-09-04 DIAGNOSIS — Z79899 Other long term (current) drug therapy: Secondary | ICD-10-CM | POA: Insufficient documentation

## 2019-09-04 DIAGNOSIS — Z95828 Presence of other vascular implants and grafts: Secondary | ICD-10-CM

## 2019-09-04 DIAGNOSIS — Z5111 Encounter for antineoplastic chemotherapy: Secondary | ICD-10-CM | POA: Diagnosis present

## 2019-09-04 LAB — CBC WITH DIFFERENTIAL (CANCER CENTER ONLY)
Abs Immature Granulocytes: 0.19 10*3/uL — ABNORMAL HIGH (ref 0.00–0.07)
Basophils Absolute: 0 10*3/uL (ref 0.0–0.1)
Basophils Relative: 1 %
Eosinophils Absolute: 0 10*3/uL (ref 0.0–0.5)
Eosinophils Relative: 1 %
HCT: 33.6 % — ABNORMAL LOW (ref 39.0–52.0)
Hemoglobin: 10.9 g/dL — ABNORMAL LOW (ref 13.0–17.0)
Immature Granulocytes: 6 %
Lymphocytes Relative: 23 %
Lymphs Abs: 0.8 10*3/uL (ref 0.7–4.0)
MCH: 28.6 pg (ref 26.0–34.0)
MCHC: 32.4 g/dL (ref 30.0–36.0)
MCV: 88.2 fL (ref 80.0–100.0)
Monocytes Absolute: 0.5 10*3/uL (ref 0.1–1.0)
Monocytes Relative: 16 %
Neutro Abs: 1.8 10*3/uL (ref 1.7–7.7)
Neutrophils Relative %: 53 %
Platelet Count: 231 10*3/uL (ref 150–400)
RBC: 3.81 MIL/uL — ABNORMAL LOW (ref 4.22–5.81)
RDW: 16.4 % — ABNORMAL HIGH (ref 11.5–15.5)
WBC Count: 3.4 10*3/uL — ABNORMAL LOW (ref 4.0–10.5)
nRBC: 0 % (ref 0.0–0.2)

## 2019-09-04 LAB — CMP (CANCER CENTER ONLY)
ALT: 46 U/L — ABNORMAL HIGH (ref 0–44)
AST: 52 U/L — ABNORMAL HIGH (ref 15–41)
Albumin: 3.9 g/dL (ref 3.5–5.0)
Alkaline Phosphatase: 69 U/L (ref 38–126)
Anion gap: 11 (ref 5–15)
BUN: 17 mg/dL (ref 8–23)
CO2: 25 mmol/L (ref 22–32)
Calcium: 9.5 mg/dL (ref 8.9–10.3)
Chloride: 103 mmol/L (ref 98–111)
Creatinine: 1.15 mg/dL (ref 0.61–1.24)
GFR, Est AFR Am: >60 mL/min (ref >60)
GFR, Estimated: >60 mL/min (ref >60)
Glucose, Bld: 213 mg/dL — ABNORMAL HIGH (ref 70–99)
Potassium: 4.5 mmol/L (ref 3.5–5.1)
Sodium: 139 mmol/L (ref 135–145)
Total Bilirubin: <0.2 mg/dL — ABNORMAL LOW (ref 0.3–1.2)
Total Protein: 7.3 g/dL (ref 6.5–8.1)

## 2019-09-04 LAB — MAGNESIUM: Magnesium: 1.3 mg/dL — CL (ref 1.7–2.4)

## 2019-09-04 MED ORDER — HEPARIN SOD (PORK) LOCK FLUSH 100 UNIT/ML IV SOLN
500.0000 [IU] | Freq: Once | INTRAVENOUS | Status: AC | PRN
  Administered 2019-09-04: 500 [IU]
  Filled 2019-09-04: qty 5

## 2019-09-04 MED ORDER — MAGNESIUM OXIDE 400 (241.3 MG) MG PO TABS: 400.0000 mg | ORAL_TABLET | Freq: Two times a day (BID) | ORAL | 0 refills | Status: AC

## 2019-09-04 MED ORDER — SODIUM CHLORIDE 0.9% FLUSH
10.0000 mL | INTRAVENOUS | Status: DC | PRN
  Administered 2019-09-04: 10 mL
  Filled 2019-09-04: qty 10

## 2019-09-04 MED ORDER — SODIUM CHLORIDE 0.9 % IV SOLN
Freq: Once | INTRAVENOUS | Status: AC
  Administered 2019-09-04: 14:00:00 via INTRAVENOUS
  Filled 2019-09-04: qty 250

## 2019-09-04 MED ORDER — PROCHLORPERAZINE MALEATE 10 MG PO TABS
ORAL_TABLET | ORAL | Status: AC
  Filled 2019-09-04: qty 1

## 2019-09-04 MED ORDER — PROCHLORPERAZINE MALEATE 10 MG PO TABS
10.0000 mg | ORAL_TABLET | Freq: Once | ORAL | Status: AC
  Administered 2019-09-04: 10 mg via ORAL

## 2019-09-04 MED ORDER — SODIUM CHLORIDE 0.9 % IV SOLN
1000.0000 mg/m2 | Freq: Once | INTRAVENOUS | Status: AC
  Administered 2019-09-04: 2356 mg via INTRAVENOUS
  Filled 2019-09-04: qty 61.96

## 2019-09-04 NOTE — Patient Instructions (Addendum)
[facility] Discharge Instructions for Patients Receiving Chemotherapy  Today you received the following chemotherapy agents: Gemzar  To help prevent nausea and vomiting after your treatment, we encourage you to take your nausea medication as directed.   If you develop nausea and vomiting that is not controlled by your nausea medication, call the clinic.   BELOW ARE SYMPTOMS THAT SHOULD BE REPORTED IMMEDIATELY:  *FEVER GREATER THAN 100.5 F  *CHILLS WITH OR WITHOUT FEVER  NAUSEA AND VOMITING THAT IS NOT CONTROLLED WITH YOUR NAUSEA MEDICATION  *UNUSUAL SHORTNESS OF BREATH  *UNUSUAL BRUISING OR BLEEDING  TENDERNESS IN MOUTH AND THROAT WITH OR WITHOUT PRESENCE OF ULCERS  *URINARY PROBLEMS  *BOWEL PROBLEMS  UNUSUAL RASH Items with * indicate a potential emergency and should be followed up as soon as possible.  Feel free to call the clinic should you have any questions or concerns. The clinic phone number is [phone].  Please show the [place] at check-in to the Emergency Department and triage nurse.   Hypomagnesemia Hypomagnesemia is a condition in which the level of magnesium in the blood is low. Magnesium is a mineral that is found in many foods. It is used in many different processes in the body. Hypomagnesemia can affect every organ in the body. In severe cases, it can cause life-threatening problems. What are the causes? This condition may be caused by:  Not getting enough magnesium in your diet.  Malnutrition.  Problems with absorbing magnesium from the intestines.  Dehydration.  Alcohol abuse.  Vomiting.  Severe or chronic diarrhea.  Some medicines, including medicines that make you urinate more (diuretics).  Certain diseases, such as kidney disease, diabetes, celiac disease, and overactive thyroid. What are the signs or symptoms? Symptoms of this condition include:  Loss of appetite.  Nausea and vomiting.  Involuntary  shaking or trembling of a body part (tremor).  Muscle weakness.  Tingling in the arms and legs.  Sudden tightening of muscles (muscle spasms).  Confusion.  Psychiatric issues, such as depression, irritability, or psychosis.  A feeling of fluttering of the heart.  Seizures. These symptoms are more severe if magnesium levels drop suddenly. How is this diagnosed? This condition may be diagnosed based on:  Your symptoms and medical history.  A physical exam.  Blood and urine tests. How is this treated? Treatment depends on the cause and the severity of the condition. It may be treated with:  A magnesium supplement. This can be taken in pill form. If the condition is severe, magnesium is usually given through an IV.  Changes to your diet. You may be directed to eat foods that have a lot of magnesium, such as green leafy vegetables, peas, beans, and nuts.  Stopping any intake of alcohol. Follow these instructions at home:      Make sure that your diet includes foods with magnesium. Foods that have a lot of magnesium in them include: ? Green leafy vegetables, such as spinach and broccoli. ? Beans and peas. ? Nuts and seeds, such as almonds and sunflower seeds. ? Whole grains, such as whole grain bread and fortified cereals.  Take magnesium supplements if your health care provider tells you to do that. Take them as directed.  Take over-the-counter and prescription medicines only as told by your health care provider.  Have your magnesium levels monitored as told by your health care provider.  When you are active, drink fluids that contain electrolytes.  Avoid drinking alcohol.  Keep all follow-up  visits as told by your health care provider. This is important. Contact a health care provider if:  You get worse instead of better.  Your symptoms return. Get help right away if you:  Develop severe muscle weakness.  Have trouble breathing.  Feel that your heart is  racing. Summary  Hypomagnesemia is a condition in which the level of magnesium in the blood is low.  Hypomagnesemia can affect every organ in the body.  Treatment may include eating more foods that contain magnesium, taking magnesium supplements, and not drinking alcohol.  Have your magnesium levels monitored as told by your health care provider. This information is not intended to replace advice given to you by your health care provider. Make sure you discuss any questions you have with your health care provider. Document Released: 09/12/2005 Document Revised: 11/29/2017 Document Reviewed: 11/18/2017 Elsevier Patient Education  2020 [employer].

## 2019-09-04 NOTE — Telephone Encounter (Signed)
Dr. Alen Blew made aware of Mg 1.3. Prescription sent to patient's pharmacy for Mg. Cleopatra Cedar, RN, pt's nurse in infusion informed patient of Mg level and new prescription.

## 2019-09-04 NOTE — Telephone Encounter (Signed)
CRITICAL VALUE STICKER  CRITICAL VALUE: Mg+ = 1.3  RECEIVER (on-site recipient of call): Keontre Defino Winston-Spruiell RN, Triage [place] NOTIFIED: 09/04/2019 at 2:10 pm   MESSENGER (representative from lab): Rhonda MT Myles Gip  MD NOTIFIED: Collaborative nurse, Natalie  TIME OF NOTIFICATION: 09/04/2019 at 2:15 pm.   RESPONSE: None

## 2019-09-17 ENCOUNTER — Encounter: Payer: Self-pay | Admitting: Oncology

## 2019-09-17 ENCOUNTER — Other Ambulatory Visit: Payer: Self-pay

## 2019-09-17 ENCOUNTER — Inpatient Hospital Stay (HOSPITAL_BASED_OUTPATIENT_CLINIC_OR_DEPARTMENT_OTHER): Payer: Medicare Other | Admitting: Oncology

## 2019-09-17 ENCOUNTER — Inpatient Hospital Stay: Payer: Medicare Other

## 2019-09-17 VITALS — BP 148/80 | HR 91 | Temp 98.0°F | Resp 18 | Wt 256.7 lb

## 2019-09-17 DIAGNOSIS — C679 Malignant neoplasm of bladder, unspecified: Secondary | ICD-10-CM

## 2019-09-17 DIAGNOSIS — Z5111 Encounter for antineoplastic chemotherapy: Secondary | ICD-10-CM | POA: Diagnosis not present

## 2019-09-17 LAB — CMP (CANCER CENTER ONLY)
ALT: 27 U/L (ref 0–44)
AST: 31 U/L (ref 15–41)
Albumin: 4 g/dL (ref 3.5–5.0)
Alkaline Phosphatase: 60 U/L (ref 38–126)
Anion gap: 13 (ref 5–15)
BUN: 16 mg/dL (ref 8–23)
CO2: 23 mmol/L (ref 22–32)
Calcium: 9.2 mg/dL (ref 8.9–10.3)
Chloride: 106 mmol/L (ref 98–111)
Creatinine: 1.27 mg/dL — ABNORMAL HIGH (ref 0.61–1.24)
GFR, Est AFR Am: >60 mL/min (ref >60)
GFR, Estimated: 56 mL/min — ABNORMAL LOW (ref >60)
Glucose, Bld: 162 mg/dL — ABNORMAL HIGH (ref 70–99)
Potassium: 4.2 mmol/L (ref 3.5–5.1)
Sodium: 142 mmol/L (ref 135–145)
Total Bilirubin: 0.3 mg/dL (ref 0.3–1.2)
Total Protein: 7.2 g/dL (ref 6.5–8.1)

## 2019-09-17 LAB — CBC WITH DIFFERENTIAL (CANCER CENTER ONLY)
Abs Immature Granulocytes: 0.03 10*3/uL (ref 0.00–0.07)
Basophils Absolute: 0 10*3/uL (ref 0.0–0.1)
Basophils Relative: 1 %
Eosinophils Absolute: 0.1 10*3/uL (ref 0.0–0.5)
Eosinophils Relative: 3 %
HCT: 34.2 % — ABNORMAL LOW (ref 39.0–52.0)
Hemoglobin: 11.1 g/dL — ABNORMAL LOW (ref 13.0–17.0)
Immature Granulocytes: 1 %
Lymphocytes Relative: 16 %
Lymphs Abs: 0.7 10*3/uL (ref 0.7–4.0)
MCH: 29.2 pg (ref 26.0–34.0)
MCHC: 32.5 g/dL (ref 30.0–36.0)
MCV: 90 fL (ref 80.0–100.0)
Monocytes Absolute: 0.6 10*3/uL (ref 0.1–1.0)
Monocytes Relative: 14 %
Neutro Abs: 3 10*3/uL (ref 1.7–7.7)
Neutrophils Relative %: 65 %
Platelet Count: 130 10*3/uL — ABNORMAL LOW (ref 150–400)
RBC: 3.8 MIL/uL — ABNORMAL LOW (ref 4.22–5.81)
RDW: 19.8 % — ABNORMAL HIGH (ref 11.5–15.5)
WBC Count: 4.5 10*3/uL (ref 4.0–10.5)
nRBC: 0 % (ref 0.0–0.2)

## 2019-09-17 LAB — MAGNESIUM: Magnesium: 1.5 mg/dL — ABNORMAL LOW (ref 1.7–2.4)

## 2019-09-17 NOTE — Progress Notes (Signed)
Hematology and Oncology Follow Up Visit  Jimmy [name] [id number] 04/20/1946 73 y.o. 09/17/2019 10:13 AM [name], MDLalonde, [name], MD   Principle Diagnosis: 73 year old man with bladder cancer diagnosed in June 2020.  He presented with T2N0 high-grade urothelial carcinoma.    Prior Therapy: TURBT with right ureteral stent placement as well as left ureteral stent placement at that time.  The final pathology showed infiltrating high-grade papillary adenocarcinoma invading into the detrusor muscle with lymphovascular invasion identified.  This was completed on 06/22/2019.   Current therapy: Neoadjuvant chemotherapy started on August 07, 2019 with cisplatin and gemcitabine started on August 07, 2019.  He is here for evaluation prior to cycle 3.  Interim History: Jimmy [name] is here for a follow-up.  Since the last visit, he has tolerated chemotherapy without any major complications.  He denies any nausea, vomiting or neuropathy.  He does report some mild fatigue tiredness but resolves in his week off.  He continues to be reasonably active and attends activities of daily living.  He did have a very mild hematuria that has resolved at this time.  He denied headaches, blurry vision, syncope or seizures.  Denies any fevers, chills or sweats.  Denied chest pain, palpitation, orthopnea or leg edema.  Denied cough, wheezing or hemoptysis.  Denied nausea, vomiting or abdominal pain.  Denies any constipation or diarrhea.  Denies any frequency urgency or hesitancy.  Denies any arthralgias or myalgias.  Denies any skin rashes or lesions.  Denies any bleeding or clotting tendency.  Denies any easy bruising.  Denies any hair or nail changes.  Denies any anxiety or depression.  Remaining review of system is negative.         Medications: Updated on review. Current Outpatient Medications  Medication Sig Dispense Refill  . B-D UF III MINI PEN NEEDLES 31G X 5 MM MISC USE TO INJECT INSULIN EVERY DAY  100 each 5  . blood glucose meter kit and supplies Dispense One touch ultra meter. E11.9 1 each 0  . cholecalciferol (VITAMIN D) 1000 UNITS tablet Take 2,000 Units by mouth daily.     . Coenzyme Q10 (COQ10 PO) Take 300 mg by mouth daily.     [name] Ethyl (VASCEPA) 1 g CAPS Take 2 capsules (2 g total) by mouth 2 (two) times a day. 360 capsule 3  . lidocaine-prilocaine (EMLA) cream Apply 1 application topically as needed. 30 g 0  . liraglutide (VICTOZA) 18 MG/3ML SOPN Inject 0.3 mLs (1.8 mg total) into the skin daily. 9 pen 1  . losartan (COZAAR) 100 MG tablet Take 1 tablet (100 mg total) by mouth daily. 90 tablet 3  . Magnesium 250 MG TABS Take 250 mg by mouth daily.     . magnesium oxide (MAG-OX) 400 (241.3 Mg) MG tablet Take 1 tablet (400 mg total) by mouth 2 (two) times daily for 14 days. 28 tablet 0  . metFORMIN (GLUCOPHAGE) 1000 MG tablet TAKE 1 TABLET BY MOUTH TWICE A DAY WITH A MEAL (Patient taking differently: Take 1,000 mg by mouth 2 (two) times daily with a meal. TAKE 1 TABLET BY MOUTH TWICE A DAY WITH A MEAL) 180 tablet 1  . Multiple Vitamins-Minerals (MULTIVITAMIN WITH MINERALS) tablet Take 1 tablet by mouth daily.      . ONE TOUCH ULTRA TEST test strip USE TO TEST BLOOD SUGAR DAILY AS DIRECTED BY DOCTOR 100 each 3  . ONETOUCH DELICA LANCETS 39J MISC TEST ONCE DAILY AS DIRECTED 100 each  2  . ONETOUCH DELICA LANCETS 01S MISC USE AS DIRECTED TO TEST BLOOD SUGAR EVERY DAY 100 each 2  . phenazopyridine (PYRIDIUM) 200 MG tablet Take 1 tablet (200 mg total) by mouth 3 (three) times daily as needed for pain. 10 tablet 0  . Pitavastatin Calcium (LIVALO) 4 MG TABS Take 1 tablet (4 mg total) by mouth daily. 90 tablet 3  . prochlorperazine (COMPAZINE) 10 MG tablet Take 1 tablet (10 mg total) by mouth every 6 (six) hours as needed for nausea or vomiting. 30 tablet 0  . traMADol (ULTRAM) 50 MG tablet Take 1-2 tablets (50-100 mg total) by mouth every 6 (six) hours as needed (pain). 15 tablet 0    No current facility-administered medications for this visit.      Allergies:  Allergies  Allergen Reactions  . Ace Inhibitors Cough  . Cephalexin   . Crestor [Rosuvastatin Calcium]   . Lipitor [Atorvastatin Calcium] Other (See Comments)    MUSCLE PAINS   . Plavix [Clopidogrel Bisulfate] Hives    Past Medical History, Surgical history, Social history, and Family History without any changes on review.    Physical Exam: Blood pressure (!) 148/80, pulse 91, temperature 98 F (36.7 C), temperature source Temporal, resp. rate 18, weight 256 lb 11.2 oz (116.4 kg), SpO2 96 %.   ECOG: 0    General appearance: Alert, awake without any distress. Head: Atraumatic without abnormalities Oropharynx: Without any thrush or ulcers. Eyes: No scleral icterus. Lymph nodes: No lymphadenopathy noted in the cervical, supraclavicular, or axillary nodes Heart:regular rate and rhythm, without any murmurs or gallops.   Lung: Clear to auscultation without any rhonchi, wheezes or dullness to percussion. Abdomin: Soft, nontender without any shifting dullness or ascites. Musculoskeletal: No clubbing or cyanosis. Neurological: No motor or sensory deficits. Skin: No rashes or lesions. Psychiatric: Mood and affect appeared normal.      Lab Results: Lab Results  Component Value Date   WBC 4.5 09/17/2019   HGB 11.1 (L) 09/17/2019   HCT 34.2 (L) 09/17/2019   MCV 90.0 09/17/2019   PLT 130 (L) 09/17/2019     Chemistry      Component Value Date/Time   NA 139 09/04/2019 1332   NA 141 06/03/2019 1157   K 4.5 09/04/2019 1332   CL 103 09/04/2019 1332   CO2 25 09/04/2019 1332   BUN 17 09/04/2019 1332   BUN 19 06/03/2019 1157   CREATININE 1.15 09/04/2019 1332   CREATININE 0.89 11/19/2016 0920      Component Value Date/Time   CALCIUM 9.5 09/04/2019 1332   ALKPHOS 69 09/04/2019 1332   AST 52 (H) 09/04/2019 1332   ALT 46 (H) 09/04/2019 1332   BILITOT <0.2 (L) 09/04/2019 1332         Impression and Plan:   73 year old man with:  1.    Bladder cancer diagnosed in June 2020.  He was found to have T2N0 urothelial carcinoma.    He has tolerated the chemotherapy without any major complications after 2 cycles.  Risks and benefits of continuing gemcitabine and cisplatin chemotherapy were reviewed.  Complications include nausea, fatigue, myelosuppression or neuropathy.  He is agreeable to proceed with cycle 3 and if he tolerates it well, cycle 4 will be his last cycle.  Subsequent to that, he will be evaluated by Dr. Alinda Money for radical cystectomy.   2. IV access:Port-A-Cath remains in place without any issues.  This will continue to be used for chemotherapy.  3. Antiemetics: No issues  with nausea or vomiting.  Compazine is available to him.  4. Renal function surveillance:Kidney function was normal on previous evaluation and will continue to be monitored on cisplatin therapy.  5. Goals of care:  Therapy remains curative at this time aggressive measures are warranted given his excellent performance status.  6.    Hypomagnesemia: His magnesium has been replaced with oral and IV therapy.  This is related to cisplatin therapy.  7. Follow-up:  Will return on 09/18/2019 for the start of cycle 3, day 8 of cycle 3 will be given on 925.  He will start cycle 4 tentatively on October 09, 2019.  25  minutes was spent with the patient face-to-face today.  More than 50% of time was dedicated to updating his disease status, treatment options and answering questions regarding future plan of care.      Zola Button, MD 9/17/202010:13 AM

## 2019-09-18 ENCOUNTER — Telehealth: Payer: Self-pay | Admitting: Oncology

## 2019-09-18 ENCOUNTER — Inpatient Hospital Stay: Payer: Medicare Other

## 2019-09-18 ENCOUNTER — Other Ambulatory Visit: Payer: Self-pay

## 2019-09-18 VITALS — BP 140/71 | HR 92 | Temp 98.3°F | Resp 20

## 2019-09-18 DIAGNOSIS — Z5111 Encounter for antineoplastic chemotherapy: Secondary | ICD-10-CM | POA: Diagnosis not present

## 2019-09-18 DIAGNOSIS — C679 Malignant neoplasm of bladder, unspecified: Secondary | ICD-10-CM

## 2019-09-18 MED ORDER — PALONOSETRON HCL INJECTION 0.25 MG/5ML
INTRAVENOUS | Status: AC
  Filled 2019-09-18: qty 5

## 2019-09-18 MED ORDER — SODIUM CHLORIDE 0.9 % IV SOLN
1000.0000 mg/m2 | Freq: Once | INTRAVENOUS | Status: AC
  Administered 2019-09-18: 2356 mg via INTRAVENOUS
  Filled 2019-09-18: qty 61.96

## 2019-09-18 MED ORDER — HEPARIN SOD (PORK) LOCK FLUSH 100 UNIT/ML IV SOLN
500.0000 [IU] | Freq: Once | INTRAVENOUS | Status: AC | PRN
  Administered 2019-09-18: 500 [IU]
  Filled 2019-09-18: qty 5

## 2019-09-18 MED ORDER — SODIUM CHLORIDE 0.9% FLUSH
10.0000 mL | INTRAVENOUS | Status: DC | PRN
  Administered 2019-09-18: 10 mL
  Filled 2019-09-18: qty 10

## 2019-09-18 MED ORDER — POTASSIUM CHLORIDE 2 MEQ/ML IV SOLN
Freq: Once | INTRAVENOUS | Status: AC
  Administered 2019-09-18: 10:00:00 via INTRAVENOUS
  Filled 2019-09-18: qty 10

## 2019-09-18 MED ORDER — SODIUM CHLORIDE 0.9 % IV SOLN
Freq: Once | INTRAVENOUS | Status: AC
  Administered 2019-09-18: 09:00:00 via INTRAVENOUS
  Filled 2019-09-18: qty 250

## 2019-09-18 MED ORDER — PALONOSETRON HCL INJECTION 0.25 MG/5ML
0.2500 mg | Freq: Once | INTRAVENOUS | Status: AC
  Administered 2019-09-18: 0.25 mg via INTRAVENOUS

## 2019-09-18 MED ORDER — SODIUM CHLORIDE 0.9 % IV SOLN
Freq: Once | INTRAVENOUS | Status: AC
  Administered 2019-09-18: 12:00:00 via INTRAVENOUS
  Filled 2019-09-18: qty 5

## 2019-09-18 MED ORDER — SODIUM CHLORIDE 0.9 % IV SOLN
50.0000 mg/m2 | Freq: Once | INTRAVENOUS | Status: AC
  Administered 2019-09-18: 119 mg via INTRAVENOUS
  Filled 2019-09-18: qty 119

## 2019-09-18 NOTE — Telephone Encounter (Signed)
Scheduled per sch msg. Gave calendar

## 2019-09-18 NOTE — Patient Instructions (Signed)
[facility] Discharge Instructions for Patients Receiving Chemotherapy  Today you received the following chemotherapy agents Gemcitabine (GEMZAR) & Cisplatin (PLATINOL).  To help prevent nausea and vomiting after your treatment, we encourage you to take your nausea medication as prescribed.   If you develop nausea and vomiting that is not controlled by your nausea medication, call the clinic.   BELOW ARE SYMPTOMS THAT SHOULD BE REPORTED IMMEDIATELY:  *FEVER GREATER THAN 100.5 F  *CHILLS WITH OR WITHOUT FEVER  NAUSEA AND VOMITING THAT IS NOT CONTROLLED WITH YOUR NAUSEA MEDICATION  *UNUSUAL SHORTNESS OF BREATH  *UNUSUAL BRUISING OR BLEEDING  TENDERNESS IN MOUTH AND THROAT WITH OR WITHOUT PRESENCE OF ULCERS  *URINARY PROBLEMS  *BOWEL PROBLEMS  UNUSUAL RASH Items with * indicate a potential emergency and should be followed up as soon as possible.  Feel free to call the clinic should you have any questions or concerns. The clinic phone number is [phone].  Please show the CHEMO ALERT CARD at check-in to the Emergency Department and triage nurse.  Coronavirus (COVID-19) Are you at risk?  Are you at risk for the Coronavirus (COVID-19)?  To be considered HIGH RISK for Coronavirus (COVID-19), you have to meet the following criteria:  . Traveled to [place], [place], [place], [place] or [place]; or in the [place] to [place], [place], [place], or [place]; and have fever, cough, and shortness of breath within the last 2 weeks of travel OR . Been in close contact with a person diagnosed with COVID-19 within the last 2 weeks and have fever, cough, and shortness of breath . IF YOU DO NOT MEET THESE CRITERIA, YOU ARE CONSIDERED LOW RISK FOR COVID-19.  What to do if you are HIGH RISK for COVID-19?  . If you are having a medical emergency, call 911. . Seek medical care right away. Before you go to a doctor's office, urgent care or emergency department, call  ahead and tell them about your recent travel, contact with someone diagnosed with COVID-19, and your symptoms. You should receive instructions from your physician's office regarding next steps of care.  . When you arrive at healthcare provider, tell the healthcare staff immediately you have returned from visiting [place], [place], [place], [place] or [place]; or traveled in the [place] to [place], [place], [place], or [place]; in the last two weeks or you have been in close contact with a person diagnosed with COVID-19 in the last 2 weeks.   . Tell the health care staff about your symptoms: fever, cough and shortness of breath. . After you have been seen by a medical provider, you will be either: o Tested for (COVID-19) and discharged home on quarantine except to seek medical care if symptoms worsen, and asked to  - Stay home and avoid contact with others until you get your results (4-5 days)  - Avoid travel on public transportation if possible (such as bus, train, or airplane) or o Sent to the Emergency Department by EMS for evaluation, COVID-19 testing, and possible admission depending on your condition and test results.  What to do if you are LOW RISK for COVID-19?  Reduce your risk of any infection by using the same precautions used for avoiding the common cold or flu:  . Wash your hands often with soap and warm water for at least 20 seconds.  If soap and water are not readily available, use an alcohol-based hand sanitizer with at least 60% alcohol.  .   If coughing or sneezing, cover your mouth and nose by coughing or sneezing into the elbow areas of your shirt or coat, into a tissue or into your sleeve (not your hands). . Avoid shaking hands with others and consider head nods or verbal greetings only. . Avoid touching your eyes, nose, or mouth with unwashed hands.  . Avoid close contact with people who are sick. . Avoid places or events with large numbers of people in one location,  like concerts or sporting events. . Carefully consider travel plans you have or are making. . If you are planning any travel outside or inside the [place], visit the CDC's Travelers' Health webpage for the latest health notices. . If you have some symptoms but not all symptoms, continue to monitor at home and seek medical attention if your symptoms worsen. . If you are having a medical emergency, call 911.   ADDITIONAL HEALTHCARE OPTIONS FOR PATIENTS  [facility] / e-Visit: [email or web address]         [facility]: [phone]  [facility]: [phone]                   [facility]: [phone]    

## 2019-09-25 ENCOUNTER — Other Ambulatory Visit: Payer: Medicare Other

## 2019-09-25 ENCOUNTER — Other Ambulatory Visit: Payer: Self-pay

## 2019-09-25 ENCOUNTER — Inpatient Hospital Stay: Payer: Medicare Other

## 2019-09-25 VITALS — BP 135/75 | HR 85 | Temp 98.5°F | Resp 18

## 2019-09-25 DIAGNOSIS — C679 Malignant neoplasm of bladder, unspecified: Secondary | ICD-10-CM

## 2019-09-25 DIAGNOSIS — Z5111 Encounter for antineoplastic chemotherapy: Secondary | ICD-10-CM | POA: Diagnosis not present

## 2019-09-25 DIAGNOSIS — Z95828 Presence of other vascular implants and grafts: Secondary | ICD-10-CM

## 2019-09-25 LAB — CBC WITH DIFFERENTIAL (CANCER CENTER ONLY)
Abs Immature Granulocytes: 0.02 10*3/uL (ref 0.00–0.07)
Basophils Absolute: 0 10*3/uL (ref 0.0–0.1)
Basophils Relative: 0 %
Eosinophils Absolute: 0 10*3/uL (ref 0.0–0.5)
Eosinophils Relative: 1 %
HCT: 30.4 % — ABNORMAL LOW (ref 39.0–52.0)
Hemoglobin: 10 g/dL — ABNORMAL LOW (ref 13.0–17.0)
Immature Granulocytes: 1 %
Lymphocytes Relative: 25 %
Lymphs Abs: 0.6 10*3/uL — ABNORMAL LOW (ref 0.7–4.0)
MCH: 29 pg (ref 26.0–34.0)
MCHC: 32.9 g/dL (ref 30.0–36.0)
MCV: 88.1 fL (ref 80.0–100.0)
Monocytes Absolute: 0.3 10*3/uL (ref 0.1–1.0)
Monocytes Relative: 12 %
Neutro Abs: 1.4 10*3/uL — ABNORMAL LOW (ref 1.7–7.7)
Neutrophils Relative %: 61 %
Platelet Count: 117 10*3/uL — ABNORMAL LOW (ref 150–400)
RBC: 3.45 MIL/uL — ABNORMAL LOW (ref 4.22–5.81)
RDW: 19.9 % — ABNORMAL HIGH (ref 11.5–15.5)
WBC Count: 2.2 10*3/uL — ABNORMAL LOW (ref 4.0–10.5)
nRBC: 0 % (ref 0.0–0.2)

## 2019-09-25 LAB — CMP (CANCER CENTER ONLY)
ALT: 42 U/L (ref 0–44)
AST: 66 U/L — ABNORMAL HIGH (ref 15–41)
Albumin: 3.9 g/dL (ref 3.5–5.0)
Alkaline Phosphatase: 61 U/L (ref 38–126)
Anion gap: 12 (ref 5–15)
BUN: 29 mg/dL — ABNORMAL HIGH (ref 8–23)
CO2: 25 mmol/L (ref 22–32)
Calcium: 9.1 mg/dL (ref 8.9–10.3)
Chloride: 105 mmol/L (ref 98–111)
Creatinine: 1.4 mg/dL — ABNORMAL HIGH (ref 0.61–1.24)
GFR, Est AFR Am: 57 mL/min — ABNORMAL LOW (ref >60)
GFR, Estimated: 49 mL/min — ABNORMAL LOW (ref >60)
Glucose, Bld: 148 mg/dL — ABNORMAL HIGH (ref 70–99)
Potassium: 4.1 mmol/L (ref 3.5–5.1)
Sodium: 142 mmol/L (ref 135–145)
Total Bilirubin: 0.3 mg/dL (ref 0.3–1.2)
Total Protein: 7 g/dL (ref 6.5–8.1)

## 2019-09-25 LAB — MAGNESIUM: Magnesium: 1.2 mg/dL — CL (ref 1.7–2.4)

## 2019-09-25 MED ORDER — PROCHLORPERAZINE MALEATE 10 MG PO TABS
10.0000 mg | ORAL_TABLET | Freq: Once | ORAL | Status: AC
  Administered 2019-09-25: 11:00:00 10 mg via ORAL

## 2019-09-25 MED ORDER — HEPARIN SOD (PORK) LOCK FLUSH 100 UNIT/ML IV SOLN
500.0000 [IU] | Freq: Once | INTRAVENOUS | Status: AC | PRN
  Administered 2019-09-25: 12:00:00 500 [IU]
  Filled 2019-09-25: qty 5

## 2019-09-25 MED ORDER — SODIUM CHLORIDE 0.9% FLUSH
10.0000 mL | INTRAVENOUS | Status: DC | PRN
  Administered 2019-09-25: 10 mL
  Filled 2019-09-25: qty 10

## 2019-09-25 MED ORDER — SODIUM CHLORIDE 0.9 % IV SOLN
Freq: Once | INTRAVENOUS | Status: AC
  Administered 2019-09-25: 11:00:00 via INTRAVENOUS
  Filled 2019-09-25: qty 250

## 2019-09-25 MED ORDER — SODIUM CHLORIDE 0.9 % IV SOLN
1000.0000 mg/m2 | Freq: Once | INTRAVENOUS | Status: AC
  Administered 2019-09-25: 12:00:00 2356 mg via INTRAVENOUS
  Filled 2019-09-25: qty 61.96

## 2019-09-25 MED ORDER — PROCHLORPERAZINE MALEATE 10 MG PO TABS
ORAL_TABLET | ORAL | Status: AC
  Filled 2019-09-25: qty 1

## 2019-09-25 MED ORDER — MAGNESIUM 250 MG PO TABS: 250.0000 mg | ORAL_TABLET | Freq: Every day | ORAL | 0 refills | Status: DC

## 2019-09-25 NOTE — Patient Instructions (Signed)

## 2019-09-25 NOTE — Progress Notes (Signed)
Per Dr. Alen Blew, [place] to treat with ANC 1.4. Patient informed of new Rx for Magnesium sent to his pharmacy. Verbalized understanding.

## 2019-09-25 NOTE — Patient Instructions (Signed)
[facility] °Discharge Instructions for Patients Receiving Chemotherapy ° °Today you received the following chemotherapy agents Gemzar ° °To help prevent nausea and vomiting after your treatment, we encourage you to take your nausea medication as directed. °  °If you develop nausea and vomiting that is not controlled by your nausea medication, call the clinic.  ° °BELOW ARE SYMPTOMS THAT SHOULD BE REPORTED IMMEDIATELY: °· *FEVER GREATER THAN 100.5 F °· *CHILLS WITH OR WITHOUT FEVER °· NAUSEA AND VOMITING THAT IS NOT CONTROLLED WITH YOUR NAUSEA MEDICATION °· *UNUSUAL SHORTNESS OF BREATH °· *UNUSUAL BRUISING OR BLEEDING °· TENDERNESS IN MOUTH AND THROAT WITH OR WITHOUT PRESENCE OF ULCERS °· *URINARY PROBLEMS °· *BOWEL PROBLEMS °· UNUSUAL RASH °Items with * indicate a potential emergency and should be followed up as soon as possible. ° °Feel free to call the clinic should you have any questions or concerns. The clinic phone number is [phone]. ° °Please show the CHEMO ALERT CARD at check-in to the Emergency Department and triage nurse. ° ° °

## 2019-09-25 NOTE — Progress Notes (Signed)
Okay to treat with Mag 1.2 and ANC 1.4 per Dr. Alen Blew. Refill for po Mag will be sent to patient pharmacy today per verbal order from Dr. Alen Blew.

## 2019-10-08 ENCOUNTER — Inpatient Hospital Stay: Payer: Medicare Other | Attending: Oncology | Admitting: Oncology

## 2019-10-08 ENCOUNTER — Inpatient Hospital Stay: Payer: Medicare Other

## 2019-10-08 ENCOUNTER — Other Ambulatory Visit: Payer: Self-pay

## 2019-10-08 VITALS — BP 151/75 | HR 100 | Temp 99.1°F | Resp 18 | Ht 68.0 in | Wt 253.0 lb

## 2019-10-08 DIAGNOSIS — Z5111 Encounter for antineoplastic chemotherapy: Secondary | ICD-10-CM | POA: Diagnosis not present

## 2019-10-08 DIAGNOSIS — R110 Nausea: Secondary | ICD-10-CM | POA: Diagnosis not present

## 2019-10-08 DIAGNOSIS — Z79899 Other long term (current) drug therapy: Secondary | ICD-10-CM | POA: Diagnosis not present

## 2019-10-08 DIAGNOSIS — C679 Malignant neoplasm of bladder, unspecified: Secondary | ICD-10-CM | POA: Diagnosis not present

## 2019-10-08 LAB — CMP (CANCER CENTER ONLY)
ALT: 25 U/L (ref 0–44)
AST: 34 U/L (ref 15–41)
Albumin: 3.9 g/dL (ref 3.5–5.0)
Alkaline Phosphatase: 70 U/L (ref 38–126)
Anion gap: 12 (ref 5–15)
BUN: 19 mg/dL (ref 8–23)
CO2: 24 mmol/L (ref 22–32)
Calcium: 9.8 mg/dL (ref 8.9–10.3)
Chloride: 105 mmol/L (ref 98–111)
Creatinine: 1.46 mg/dL — ABNORMAL HIGH (ref 0.61–1.24)
GFR, Est AFR Am: 55 mL/min — ABNORMAL LOW (ref >60)
GFR, Estimated: 47 mL/min — ABNORMAL LOW (ref >60)
Glucose, Bld: 134 mg/dL — ABNORMAL HIGH (ref 70–99)
Potassium: 4.1 mmol/L (ref 3.5–5.1)
Sodium: 141 mmol/L (ref 135–145)
Total Bilirubin: 0.4 mg/dL (ref 0.3–1.2)
Total Protein: 7.3 g/dL (ref 6.5–8.1)

## 2019-10-08 LAB — CBC WITH DIFFERENTIAL (CANCER CENTER ONLY)
Abs Immature Granulocytes: 0.05 10*3/uL (ref 0.00–0.07)
Basophils Absolute: 0 10*3/uL (ref 0.0–0.1)
Basophils Relative: 0 %
Eosinophils Absolute: 0.1 10*3/uL (ref 0.0–0.5)
Eosinophils Relative: 2 %
HCT: 32.3 % — ABNORMAL LOW (ref 39.0–52.0)
Hemoglobin: 10.4 g/dL — ABNORMAL LOW (ref 13.0–17.0)
Immature Granulocytes: 1 %
Lymphocytes Relative: 15 %
Lymphs Abs: 0.7 10*3/uL (ref 0.7–4.0)
MCH: 29.5 pg (ref 26.0–34.0)
MCHC: 32.2 g/dL (ref 30.0–36.0)
MCV: 91.5 fL (ref 80.0–100.0)
Monocytes Absolute: 0.8 10*3/uL (ref 0.1–1.0)
Monocytes Relative: 19 %
Neutro Abs: 2.8 10*3/uL (ref 1.7–7.7)
Neutrophils Relative %: 63 %
Platelet Count: 233 10*3/uL (ref 150–400)
RBC: 3.53 MIL/uL — ABNORMAL LOW (ref 4.22–5.81)
RDW: 23.7 % — ABNORMAL HIGH (ref 11.5–15.5)
WBC Count: 4.5 10*3/uL (ref 4.0–10.5)
nRBC: 0 % (ref 0.0–0.2)

## 2019-10-08 LAB — MAGNESIUM: Magnesium: 1.5 mg/dL — ABNORMAL LOW (ref 1.7–2.4)

## 2019-10-08 NOTE — Progress Notes (Signed)
Hematology and Oncology Follow Up Visit  Jimmy [name] [id number] Sep 19, 1946 73 y.o. 10/08/2019 12:48 PM Jimmy [name], MDLalonde, Jimmy Jarvis, MD   Principle Diagnosis: 73 year old man with T2N0 high-grade urothelial carcinoma of the bladder diagnosed in June 2020.     Prior Therapy: TURBT with right ureteral stent placement as well as left ureteral stent placement at that time.  The final pathology showed infiltrating high-grade papillary adenocarcinoma invading into the detrusor muscle with lymphovascular invasion identified.  This was completed on 06/22/2019.   Current therapy: Neoadjuvant chemotherapy started on August 07, 2019 with cisplatin and gemcitabine started on August 07, 2019.  He is status post 3 cycles of therapy and here for evaluation prior to cycle 4.  Interim History: Jimmy [name] returns today for a follow-up visit.  Since the last visit, he reports no major changes in his health.  He continues to tolerate chemotherapy without any complaints.  He denies any nausea or vomiting.  He does report some mild fatigue and tiredness.  He denies any worsening neuropathy or falls.  He denies any hospitalizations or syncope.   Patient denied any alteration mental status, neuropathy, confusion or dizziness.  Denies any headaches or lethargy.  Denies any night sweats, weight loss or changes in appetite.  Denied orthopnea, dyspnea on exertion or chest discomfort.  Denies shortness of breath, difficulty breathing hemoptysis or cough.  Denies any abdominal distention, nausea, early satiety or dyspepsia.  Denies any hematuria, frequency, dysuria or nocturia.  Denies any skin irritation, dryness or rash.  Denies any ecchymosis or petechiae.  Denies any lymphadenopathy or clotting.  Denies any heat or cold intolerance.  Denies any anxiety or depression.  Remaining review of system is negative.              Medications: Without any changes on review. Current Outpatient Medications   Medication Sig Dispense Refill  . B-D UF III MINI PEN NEEDLES 31G X 5 MM MISC USE TO INJECT INSULIN EVERY DAY 100 each 5  . blood glucose meter kit and supplies Dispense One touch ultra meter. E11.9 1 each 0  . cholecalciferol (VITAMIN D) 1000 UNITS tablet Take 5,000 Units by mouth daily.    . Coenzyme Q10 (COQ10 PO) Take 300 mg by mouth daily.     Jimmy [name] Ethyl (VASCEPA) 1 g CAPS Take 2 capsules (2 g total) by mouth 2 (two) times a day. 360 capsule 3  . lidocaine-prilocaine (EMLA) cream Apply 1 application topically as needed. 30 g 0  . liraglutide (VICTOZA) 18 MG/3ML SOPN Inject 0.3 mLs (1.8 mg total) into the skin daily. 9 pen 1  . losartan (COZAAR) 100 MG tablet Take 1 tablet (100 mg total) by mouth daily. 90 tablet 3  . Magnesium 250 MG TABS Take 1 tablet (250 mg total) by mouth daily. 14 tablet 0  . metFORMIN (GLUCOPHAGE) 1000 MG tablet TAKE 1 TABLET BY MOUTH TWICE A DAY WITH A MEAL (Patient taking differently: Take 1,000 mg by mouth 2 (two) times daily with a meal. TAKE 1 TABLET BY MOUTH TWICE A DAY WITH A MEAL) 180 tablet 1  . Multiple Vitamins-Minerals (MULTIVITAMIN WITH MINERALS) tablet Take 1 tablet by mouth daily.      . ONE TOUCH ULTRA TEST test strip USE TO TEST BLOOD SUGAR DAILY AS DIRECTED BY DOCTOR 100 each 3  . ONETOUCH DELICA LANCETS 20E MISC TEST ONCE DAILY AS DIRECTED 100 each 2  . ONETOUCH DELICA LANCETS 33I MISC USE AS DIRECTED TO  TEST BLOOD SUGAR EVERY DAY 100 each 2  . phenazopyridine (PYRIDIUM) 200 MG tablet Take 1 tablet (200 mg total) by mouth 3 (three) times daily as needed for pain. 10 tablet 0  . Pitavastatin Calcium (LIVALO) 4 MG TABS Take 1 tablet (4 mg total) by mouth daily. 90 tablet 3  . prochlorperazine (COMPAZINE) 10 MG tablet Take 1 tablet (10 mg total) by mouth every 6 (six) hours as needed for nausea or vomiting. 30 tablet 0  . traMADol (ULTRAM) 50 MG tablet Take 1-2 tablets (50-100 mg total) by mouth every 6 (six) hours as needed (pain). 15 tablet 0    No current facility-administered medications for this visit.      Allergies:  Allergies  Allergen Reactions  . Ace Inhibitors Cough  . Cephalexin   . Crestor [Rosuvastatin Calcium]   . Lipitor [Atorvastatin Calcium] Other (See Comments)    MUSCLE PAINS   . Plavix [Clopidogrel Bisulfate] Hives    Past Medical History, Surgical history, Social history, and Family History updated without changes.   Physical Exam: Blood pressure (!) 151/75, pulse 100, temperature 99.1 F (37.3 C), temperature source Temporal, resp. rate 18, height '5\' 8"'  (1.727 m), weight 253 lb (114.8 kg), SpO2 93 %.    ECOG: 0    General appearance: Comfortable appearing without any discomfort Head: Normocephalic without any trauma Oropharynx: Mucous membranes are moist and pink without any thrush or ulcers. Eyes: Pupils are equal and round reactive to light. Lymph nodes: No cervical, supraclavicular, inguinal or axillary lymphadenopathy.   Heart:regular rate and rhythm.  S1 and S2 without leg edema. [name]: Clear without any rhonchi or wheezes.  No dullness to percussion. Abdomin: Soft, nontender, nondistended with good bowel sounds.  No hepatosplenomegaly. Musculoskeletal: No joint deformity or effusion.  Full range of motion noted. Neurological: No deficits noted on motor, sensory and deep tendon reflex exam. Skin: No petechial rash or dryness.  Appeared moist.        Lab Results: Lab Results  Component Value Date   WBC 2.2 (L) 09/25/2019   HGB 10.0 (L) 09/25/2019   HCT 30.4 (L) 09/25/2019   MCV 88.1 09/25/2019   PLT 117 (L) 09/25/2019     Chemistry      Component Value Date/Time   NA 142 09/25/2019 0840   NA 141 06/03/2019 1157   K 4.1 09/25/2019 0840   CL 105 09/25/2019 0840   CO2 25 09/25/2019 0840   BUN 29 (H) 09/25/2019 0840   BUN 19 06/03/2019 1157   CREATININE 1.40 (H) 09/25/2019 0840   CREATININE 0.89 11/19/2016 0920      Component Value Date/Time   CALCIUM 9.1 09/25/2019  0840   ALKPHOS 61 09/25/2019 0840   AST 66 (H) 09/25/2019 0840   ALT 42 09/25/2019 0840   BILITOT 0.3 09/25/2019 0840        Impression and Plan:   73 year old man with:  1.    T2N0 high-grade urothelial carcinoma of the bladder diagnosed in June 2020.    He is currently receiving neoadjuvant chemotherapy utilizing gemcitabine and cisplatin without complications.  Risks and benefits of proceeding with cycle 4 of therapy was discussed today.  Potential complications including myelosuppression, renal insufficiency, nausea and neuropathy were reviewed.  Upon completing his therapy, we will restage him with CT scan prior to proceeding with radical cystectomy.  He is already set up to have a CT scan and follow-up with Dr. Alinda Money to his end of this month.   2.  IV access:Port-A-Cath currently in use without any issues or complications.  3. Antiemetics: Currently on Compazine which is effective in treating his nausea at this time.   4. Renal function surveillance:His creatinine clearance continues to be close to 50 cc/min at this time.  We will continue to monitor and make adjustments with his cisplatin dose.  5. Goals of care:His disease is curable and aggressive measures are warranted.  6.    Hypomagnesemia: He will continue to receive magnesium supplements.  This is related to cisplatin therapy..  7. Follow-up:  10/09/2019 for the start of day 1 of cycle 4 of therapy.  25  minutes was spent with the patient face-to-face today.  More than 50% of time was spent on reviewing his laboratory data, disease status update, treatment options and answering questions regarding future plan of care.      Zola Button, MD 10/8/202012:48 PM

## 2019-10-09 ENCOUNTER — Inpatient Hospital Stay: Payer: Medicare Other

## 2019-10-09 ENCOUNTER — Other Ambulatory Visit: Payer: Self-pay | Admitting: Oncology

## 2019-10-09 ENCOUNTER — Other Ambulatory Visit: Payer: Self-pay

## 2019-10-09 VITALS — BP 146/79 | HR 100 | Temp 98.0°F | Resp 16

## 2019-10-09 DIAGNOSIS — Z5111 Encounter for antineoplastic chemotherapy: Secondary | ICD-10-CM | POA: Diagnosis not present

## 2019-10-09 DIAGNOSIS — C679 Malignant neoplasm of bladder, unspecified: Secondary | ICD-10-CM

## 2019-10-09 MED ORDER — HEPARIN SOD (PORK) LOCK FLUSH 100 UNIT/ML IV SOLN
500.0000 [IU] | Freq: Once | INTRAVENOUS | Status: AC | PRN
  Administered 2019-10-09: 500 [IU]
  Filled 2019-10-09: qty 5

## 2019-10-09 MED ORDER — PALONOSETRON HCL INJECTION 0.25 MG/5ML
0.2500 mg | Freq: Once | INTRAVENOUS | Status: AC
  Administered 2019-10-09: 11:00:00 0.25 mg via INTRAVENOUS

## 2019-10-09 MED ORDER — SODIUM CHLORIDE 0.9% FLUSH
10.0000 mL | INTRAVENOUS | Status: DC | PRN
  Administered 2019-10-09: 16:00:00 10 mL
  Filled 2019-10-09: qty 10

## 2019-10-09 MED ORDER — SODIUM CHLORIDE 0.9 % IV SOLN
Freq: Once | INTRAVENOUS | Status: AC
  Administered 2019-10-09: 09:00:00 via INTRAVENOUS
  Filled 2019-10-09: qty 250

## 2019-10-09 MED ORDER — SODIUM CHLORIDE 0.9 % IV SOLN
50.0000 mg/m2 | Freq: Once | INTRAVENOUS | Status: AC
  Administered 2019-10-09: 13:00:00 119 mg via INTRAVENOUS
  Filled 2019-10-09: qty 119

## 2019-10-09 MED ORDER — POTASSIUM CHLORIDE 2 MEQ/ML IV SOLN
Freq: Once | INTRAVENOUS | Status: AC
  Administered 2019-10-09: 09:00:00 via INTRAVENOUS
  Filled 2019-10-09: qty 10

## 2019-10-09 MED ORDER — SODIUM CHLORIDE 0.9 % IV SOLN
1000.0000 mg/m2 | Freq: Once | INTRAVENOUS | Status: AC
  Administered 2019-10-09: 12:00:00 2356 mg via INTRAVENOUS
  Filled 2019-10-09: qty 61.96

## 2019-10-09 MED ORDER — SODIUM CHLORIDE 0.9 % IV SOLN
Freq: Once | INTRAVENOUS | Status: AC
  Administered 2019-10-09: 11:00:00 via INTRAVENOUS
  Filled 2019-10-09: qty 5

## 2019-10-09 MED ORDER — PALONOSETRON HCL INJECTION 0.25 MG/5ML
INTRAVENOUS | Status: AC
  Filled 2019-10-09: qty 5

## 2019-10-09 NOTE — Progress Notes (Signed)
Per Dr. Alen Blew, [place] to treat with urine output 29mL.

## 2019-10-09 NOTE — Patient Instructions (Signed)
[facility] Discharge Instructions for Patients Receiving Chemotherapy  Today you received the following chemotherapy agents Gemzar and Cisplatin  To help prevent nausea and vomiting after your treatment, we encourage you to take your nausea medication as directed   If you develop nausea and vomiting that is not controlled by your nausea medication, call the clinic.   BELOW ARE SYMPTOMS THAT SHOULD BE REPORTED IMMEDIATELY:  *FEVER GREATER THAN 100.5 F  *CHILLS WITH OR WITHOUT FEVER  NAUSEA AND VOMITING THAT IS NOT CONTROLLED WITH YOUR NAUSEA MEDICATION  *UNUSUAL SHORTNESS OF BREATH  *UNUSUAL BRUISING OR BLEEDING  TENDERNESS IN MOUTH AND THROAT WITH OR WITHOUT PRESENCE OF ULCERS  *URINARY PROBLEMS  *BOWEL PROBLEMS  UNUSUAL RASH Items with * indicate a potential emergency and should be followed up as soon as possible.  Feel free to call the clinic should you have any questions or concerns. The clinic phone number is [phone].  Please show the CHEMO ALERT CARD at check-in to the Emergency Department and triage nurse.   

## 2019-10-12 ENCOUNTER — Encounter: Payer: Self-pay | Admitting: Cardiology

## 2019-10-12 ENCOUNTER — Encounter: Payer: Self-pay | Admitting: *Deleted

## 2019-10-12 ENCOUNTER — Other Ambulatory Visit: Payer: Self-pay

## 2019-10-12 ENCOUNTER — Ambulatory Visit: Payer: Medicare Other | Admitting: Cardiology

## 2019-10-12 VITALS — BP 130/70 | HR 99 | Ht 68.0 in | Wt 252.4 lb

## 2019-10-12 DIAGNOSIS — I2510 Atherosclerotic heart disease of native coronary artery without angina pectoris: Secondary | ICD-10-CM | POA: Diagnosis not present

## 2019-10-12 DIAGNOSIS — E782 Mixed hyperlipidemia: Secondary | ICD-10-CM

## 2019-10-12 DIAGNOSIS — Z01810 Encounter for preprocedural cardiovascular examination: Secondary | ICD-10-CM | POA: Diagnosis not present

## 2019-10-12 NOTE — Patient Instructions (Signed)
Medication Instructions:  The current medical regimen is effective;  continue present plan and medications.  If you need a refill on your cardiac medications before your next appointment, please call your pharmacy.   Follow-Up: At [facility], you and your health needs are our priority.  As part of our continuing mission to provide you with exceptional heart care, we have created designated Provider Care Teams.  These Care Teams include your primary Cardiologist (physician) and Advanced Practice Providers (APPs -  Physician Assistants and Nurse Practitioners) who all work together to provide you with the care you need, when you need it. You will need a follow up appointment in 12 months.  Please call our office 2 months in advance to schedule this appointment.  You may see Mark Skains, MD or one of the following Advanced Practice Providers on your designated Care Team:   Lori Gerhardt, NP Laura Ingold, NP . Jill McDaniel, NP  Thank you for choosing [facility]!!      

## 2019-10-12 NOTE — Progress Notes (Signed)
Cardiology Office Note:    Date:  10/12/2019   ID:  Jimmy [name], DOB 07-07-1946, Jimmy [name]  PCP:  Jimmy Lung, MD  Cardiologist:  Jimmy Furbish, MD  Electrophysiologist:  None   Referring MD: Jimmy Lung, MD     History of Present Illness:    Jimmy [name] is a 73 y.o. male here for follow-up of coronary artery disease and preoperative or stratification prior to radical prostatectomy, former patient of Dr. Saunders [name].  Has nonobstructive coronary artery disease and possible RCA dissection, left heart catheterization in 2000 with hypertension hyperlipidemia diabetes obesity and obstructive sleep apnea.  He was having some progressive exertional dyspnea in December 2018.  He went forward with a stress test and an echocardiogram neither which showed any significant abnormalities.  Jimmy [name] with pulmonary medicine saw him and switched him from ramipril to Irbesartin.  Since then, Jimmy [name] noted resolution of the shortness of breath.  He stopped taking the fenofibrate and he says that his leg pain has improved. Jimmy [name] talked to him at length about weight loss.   Other than the tiredness from his chemotherapy, he is doing quite well.  He is not have experiencing any anginal symptoms no syncope no bleeding no orthopnea.  Overall he has not been having any significant anginal symptoms no fevers chills nausea vomiting syncope bleeding.   Past Medical History:  Diagnosis Date  . Astigmatism of both eyes 11/01/2016  . Bronchitis   . Coronary artery disease   . Cortical age-related cataract of both eyes 11/01/2016  . Diabetes mellitus without complication ([place])    type 2  . Drug-induced osteoporosis   . Dyslipidemia   . Fatigue   . Fatty liver   . History of kidney stones   . Hypercholesteremia   . Hypogonadism male   . 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 Normal nuclear  stress test March 2011  . Nuclear sclerotic cataract of both eyes 11/01/2016  . Obesity   . Prostate cancer [facility]    s/p seed implant and radiation  . Sleep apnea    90% of time dosent wear his cpap per pt  . Vitamin D insufficiency     Past Surgical History:  Procedure Laterality Date  . CARDIAC CATHETERIZATION  April 2000  . IR IMAGING GUIDED PORT INSERTION  07/30/2019  . RADIOACTIVE SEED IMPLANT  2009   Implanted radiation seeds  . thumb surgery     as a teen caught thumb in machine  . TRANSURETHRAL RESECTION OF BLADDER TUMOR N/A 06/22/2019   Procedure: TRANSURETHRAL RESECTION OF BLADDER TUMOR (TURBT), CYSTOSCOPY,  BILATERAL RETROGRADE PYELOGRAPHY, BILATERAL STENT PLACEMENT;  Surgeon: Raynelle Bring, MD;  Location: [facility];  Service: Urology;  Laterality: N/A;  GENERAL WITH PARALYSIS    Current Medications: Current Meds  Medication Sig  . B-D UF III MINI PEN NEEDLES 31G X 5 MM MISC USE TO INJECT INSULIN EVERY DAY  . cholecalciferol (VITAMIN D) 1000 UNITS tablet Take 5,000 Units by mouth daily.  . Coenzyme Q10 (COQ10 PO) Take 300 mg by mouth daily.   [name] Ethyl (VASCEPA) 1 g CAPS Take 2 capsules (2 g total) by mouth 2 (two) times a day.  . lidocaine-prilocaine (EMLA) cream Apply 1 application topically as needed.  . liraglutide (VICTOZA) 18 MG/3ML SOPN Inject 0.3 mLs (1.8 mg total) into the skin  daily.  . losartan (COZAAR) 100 MG tablet Take 1 tablet (100 mg total) by mouth daily.  . Magnesium 250 MG TABS Take 1 tablet (250 mg total) by mouth daily.  . metFORMIN (GLUCOPHAGE) 1000 MG tablet TAKE 1 TABLET BY MOUTH TWICE A DAY WITH A MEAL  . Multiple Vitamins-Minerals (MULTIVITAMIN WITH MINERALS) tablet Take 1 tablet by mouth daily.    . ONE TOUCH ULTRA TEST test strip USE TO TEST BLOOD SUGAR DAILY AS DIRECTED BY DOCTOR  . ONETOUCH DELICA LANCETS 99991111 MISC USE AS DIRECTED TO TEST BLOOD SUGAR EVERY DAY  . Pitavastatin Calcium (LIVALO) 4 MG TABS Take 1 tablet (4 mg total) by  mouth daily.  . prochlorperazine (COMPAZINE) 10 MG tablet Take 1 tablet (10 mg total) by mouth every 6 (six) hours as needed for nausea or vomiting.     Allergies:   Ace inhibitors, Cephalexin, Crestor [rosuvastatin calcium], Lipitor [atorvastatin calcium], and Plavix [clopidogrel bisulfate]   Social History   Socioeconomic History  . Marital status: Married    Spouse name: Not on file  . Number of children: Not on file  . Years of education: Not on file  . Highest education level: Not on file  Occupational History  . Not on file  Social Needs  . Financial resource strain: Not on file  . Food insecurity    Worry: Not on file    Inability: Not on file  . Transportation needs    Medical: Not on file    Non-medical: Not on file  Tobacco Use  . Smoking status: Former Smoker    Packs/day: 1.00    Years: 40.00    Pack years: 40.00    Types: Cigarettes    Quit date: 08/20/1999    Years since quitting: 20.1  . Smokeless tobacco: Never Used  Substance and Sexual Activity  . Alcohol use: Yes    Alcohol/week: 1.0 standard drinks    Types: 1 Shots of liquor per week    Comment: occasional  . Drug use: No  . Sexual activity: Not Currently  Lifestyle  . Physical activity    Days per week: Not on file    Minutes per session: Not on file  . Stress: Not on file  Relationships  . Social Herbalist on phone: Not on file    Gets together: Not on file    Attends religious service: Not on file    Active member of club or organization: Not on file    Attends meetings of clubs or organizations: Not on file    Relationship status: Not on file  Other Topics Concern  . Not on file  Social History Narrative  . Not on file     Family History: The patient's family history includes COPD in his father.  ROS:   Please see the history of present illness.     all other systems reviewed and are negative.  EKGs/Labs/Other Studies Reviewed:    The following studies were  reviewed today: Echo (12/19/2017): Normal LV size and wall thickness.  LVEF 55 to 60% with normal wall motion.  Grade 1 diastolic dysfunction.  Mild left atrial enlargement.  Normal RV size and function.  No significant valvular abnormalities.  Exercise MPI (01/08/2018): Decreased exercise capacity.  No ischemia or scar.  LVEF greater than 65%.  EKG:  EKG is not ordered today.  06/22/19-sinus rhythm, 1st degree avb, with no other specific abnormalities.  Personally reviewed and interpreted.  Recent Labs:  10/08/2019: ALT 25; BUN 19; Creatinine 1.46; Hemoglobin 10.4; Magnesium 1.5; Platelet Count 233; Potassium 4.1; Sodium 141  Recent Lipid Panel    Component Value Date/Time   CHOL 178 06/03/2019 1157   TRIG 355 (H) 06/03/2019 1157   HDL 41 06/03/2019 1157   CHOLHDL 4.3 06/03/2019 1157   CHOLHDL 4.4 11/05/2016 1554   VLDL NOT CALC 11/05/2016 1554   LDLCALC 66 06/03/2019 1157   LDLDIRECT 73.0 08/22/2015 1500    Physical Exam:    VS:  BP 130/70   Pulse 99   Ht 5\' 8"  (1.727 m)   Wt 252 lb 6.4 oz (114.5 kg)   SpO2 94%   BMI 38.38 kg/m     Wt Readings from Last 3 Encounters:  10/12/19 252 lb 6.4 oz (114.5 kg)  10/08/19 253 lb (114.8 kg)  09/17/19 256 lb 11.2 oz (116.4 kg)     GEN: Well nourished, well developed, in no acute distress obese HEENT: normal  Neck: no JVD, carotid bruits, or masses Cardiac: RRR; no murmurs, rubs, or gallops,no edema  Respiratory:  clear to auscultation bilaterally, normal work of breathing GI: soft, nontender, nondistended, + BS MS: no deformity or atrophy  Skin: warm and dry, no rash Neuro:  Alert and Oriented x 3, Strength and sensation are intact Psych: euthymic mood, full affect   ASSESSMENT:    1. Preoperative cardiovascular examination   2. Coronary artery disease involving native coronary artery of native heart without angina pectoris   3. Mixed hyperlipidemia   4. Morbid obesity ([place])    PLAN:    In order of problems listed above:   Preoperative cardiac risk stratification prostate/bladder cancer -He may proceed with radical cystoprostatectomy with mild to moderate overall cardiac risk.  Recent stress test in January 2019 was low risk.  Excellent.  Has motivation to see his grandchildren graduate high [name].  Currently undergoing chemotherapy.  Coronary artery disease - Doing quite well.  Stress test was low risk in January 2019.  Excellent.  Continue with aggressive secondary prevention.  No changes made.  Shortness of breath -Improved quite a bit when switching from ACE inhibitor to angiotensin receptor blocker by Jimmy [name].  Continue with weight loss and exercise.  Still doing well.  Diabetes with hypertension - At last visit Irbesartan was increased to 150 mg a day. Now on Losartan 100mg  Increase.  Diabetes-coronary artery disease equivalent.  Well-controlled currently.  Hemoglobin A1c 7.2 06/03/2019  Morbid obesity - BMI 38 with 2 or more cardiac comorbidities.  Continue to encourage weight loss.  Decrease carbohydrates.  He is doing very well with this.  Mixed lipids  -Triglycerides 355.  Livalo.    Medication Adjustments/Labs and Tests Ordered: Current medicines are reviewed at length with the patient today.  Concerns regarding medicines are outlined above.  No orders of the defined types were placed in this encounter.  No orders of the defined types were placed in this encounter.   Patient Instructions  Medication Instructions:  The current medical regimen is effective;  continue present plan and medications.  If you need a refill on your cardiac medications before your next appointment, please call your pharmacy.   Follow-Up: At [facility], you and your health needs are our priority.  As part of our continuing mission to provide you with exceptional heart care, we have created designated Provider Care Teams.  These Care Teams include your primary Cardiologist (physician) and Advanced Practice  Providers (APPs -  Physician Assistants and Nurse  Practitioners) who all work together to provide you with the care you need, when you need it. You will need a follow up appointment in 12 months.  Please call our office 2 months in advance to schedule this appointment.  You may see Jimmy Furbish, MD or one of the following Advanced Practice Providers on your designated Care Team:   Truitt Merle, NP Cecilie Kicks, NP . Kathyrn Drown, NP  Thank you for choosing [facility]!!          Signed, Jimmy Furbish, MD  10/12/2019 1:45 PM    [place]

## 2019-10-14 ENCOUNTER — Other Ambulatory Visit: Payer: Self-pay | Admitting: Urology

## 2019-10-16 ENCOUNTER — Inpatient Hospital Stay: Payer: Medicare Other

## 2019-10-16 ENCOUNTER — Telehealth: Payer: Self-pay

## 2019-10-16 ENCOUNTER — Other Ambulatory Visit: Payer: Self-pay

## 2019-10-16 VITALS — BP 151/78 | HR 98 | Temp 98.2°F | Resp 18

## 2019-10-16 DIAGNOSIS — C679 Malignant neoplasm of bladder, unspecified: Secondary | ICD-10-CM

## 2019-10-16 DIAGNOSIS — Z5111 Encounter for antineoplastic chemotherapy: Secondary | ICD-10-CM | POA: Diagnosis not present

## 2019-10-16 DIAGNOSIS — Z95828 Presence of other vascular implants and grafts: Secondary | ICD-10-CM

## 2019-10-16 LAB — CMP (CANCER CENTER ONLY)
ALT: 33 U/L (ref 0–44)
AST: 48 U/L — ABNORMAL HIGH (ref 15–41)
Albumin: 3.6 g/dL (ref 3.5–5.0)
Alkaline Phosphatase: 67 U/L (ref 38–126)
Anion gap: 15 (ref 5–15)
BUN: 29 mg/dL — ABNORMAL HIGH (ref 8–23)
CO2: 23 mmol/L (ref 22–32)
Calcium: 8.8 mg/dL — ABNORMAL LOW (ref 8.9–10.3)
Chloride: 106 mmol/L (ref 98–111)
Creatinine: 1.58 mg/dL — ABNORMAL HIGH (ref 0.61–1.24)
GFR, Est AFR Am: 50 mL/min — ABNORMAL LOW (ref >60)
GFR, Estimated: 43 mL/min — ABNORMAL LOW (ref >60)
Glucose, Bld: 132 mg/dL — ABNORMAL HIGH (ref 70–99)
Potassium: 4 mmol/L (ref 3.5–5.1)
Sodium: 144 mmol/L (ref 135–145)
Total Bilirubin: 0.4 mg/dL (ref 0.3–1.2)
Total Protein: 6.9 g/dL (ref 6.5–8.1)

## 2019-10-16 LAB — MAGNESIUM: Magnesium: 1.1 mg/dL — CL (ref 1.7–2.4)

## 2019-10-16 LAB — CBC WITH DIFFERENTIAL (CANCER CENTER ONLY)
Abs Immature Granulocytes: 0.05 10*3/uL (ref 0.00–0.07)
Basophils Absolute: 0 10*3/uL (ref 0.0–0.1)
Basophils Relative: 1 %
Eosinophils Absolute: 0 10*3/uL (ref 0.0–0.5)
Eosinophils Relative: 0 %
HCT: 28.5 % — ABNORMAL LOW (ref 39.0–52.0)
Hemoglobin: 9.4 g/dL — ABNORMAL LOW (ref 13.0–17.0)
Immature Granulocytes: 2 %
Lymphocytes Relative: 21 %
Lymphs Abs: 0.6 10*3/uL — ABNORMAL LOW (ref 0.7–4.0)
MCH: 30.2 pg (ref 26.0–34.0)
MCHC: 33 g/dL (ref 30.0–36.0)
MCV: 91.6 fL (ref 80.0–100.0)
Monocytes Absolute: 0.4 10*3/uL (ref 0.1–1.0)
Monocytes Relative: 17 %
Neutro Abs: 1.5 10*3/uL — ABNORMAL LOW (ref 1.7–7.7)
Neutrophils Relative %: 59 %
Platelet Count: 174 10*3/uL (ref 150–400)
RBC: 3.11 MIL/uL — ABNORMAL LOW (ref 4.22–5.81)
RDW: 22.8 % — ABNORMAL HIGH (ref 11.5–15.5)
WBC Count: 2.6 10*3/uL — ABNORMAL LOW (ref 4.0–10.5)
nRBC: 0 % (ref 0.0–0.2)

## 2019-10-16 MED ORDER — SODIUM CHLORIDE 0.9 % IV SOLN
1000.0000 mg/m2 | Freq: Once | INTRAVENOUS | Status: AC
  Administered 2019-10-16: 2356 mg via INTRAVENOUS
  Filled 2019-10-16: qty 61.96

## 2019-10-16 MED ORDER — SODIUM CHLORIDE 0.9 % IV SOLN
Freq: Once | INTRAVENOUS | Status: AC
  Administered 2019-10-16: 10:00:00 via INTRAVENOUS
  Filled 2019-10-16: qty 250

## 2019-10-16 MED ORDER — SODIUM CHLORIDE 0.9% FLUSH
10.0000 mL | INTRAVENOUS | Status: DC | PRN
  Administered 2019-10-16: 10 mL
  Filled 2019-10-16: qty 10

## 2019-10-16 MED ORDER — PROCHLORPERAZINE MALEATE 10 MG PO TABS
ORAL_TABLET | ORAL | Status: AC
  Filled 2019-10-16: qty 1

## 2019-10-16 MED ORDER — PROCHLORPERAZINE MALEATE 10 MG PO TABS
10.0000 mg | ORAL_TABLET | Freq: Once | ORAL | Status: AC
  Administered 2019-10-16: 10 mg via ORAL

## 2019-10-16 MED ORDER — HEPARIN SOD (PORK) LOCK FLUSH 100 UNIT/ML IV SOLN
500.0000 [IU] | Freq: Once | INTRAVENOUS | Status: AC | PRN
  Administered 2019-10-16: 500 [IU]
  Filled 2019-10-16: qty 5

## 2019-10-16 MED ORDER — MAGNESIUM SULFATE 2 GM/50ML IV SOLN
2.0000 g | Freq: Once | INTRAVENOUS | Status: AC
  Administered 2019-10-16: 2 g via INTRAVENOUS
  Filled 2019-10-16: qty 50

## 2019-10-16 NOTE — Patient Instructions (Signed)
[facility] °Discharge Instructions for Patients Receiving Chemotherapy ° °Today you received the following chemotherapy agents Gemzar ° °To help prevent nausea and vomiting after your treatment, we encourage you to take your nausea medication as directed. °  °If you develop nausea and vomiting that is not controlled by your nausea medication, call the clinic.  ° °BELOW ARE SYMPTOMS THAT SHOULD BE REPORTED IMMEDIATELY: °· *FEVER GREATER THAN 100.5 F °· *CHILLS WITH OR WITHOUT FEVER °· NAUSEA AND VOMITING THAT IS NOT CONTROLLED WITH YOUR NAUSEA MEDICATION °· *UNUSUAL SHORTNESS OF BREATH °· *UNUSUAL BRUISING OR BLEEDING °· TENDERNESS IN MOUTH AND THROAT WITH OR WITHOUT PRESENCE OF ULCERS °· *URINARY PROBLEMS °· *BOWEL PROBLEMS °· UNUSUAL RASH °Items with * indicate a potential emergency and should be followed up as soon as possible. ° °Feel free to call the clinic should you have any questions or concerns. The clinic phone number is [phone]. ° °Please show the CHEMO ALERT CARD at check-in to the Emergency Department and triage nurse. ° ° °

## 2019-10-16 NOTE — Progress Notes (Signed)
Per Dr. Alen Blew okay to treat with abnormal labs from today

## 2019-10-16 NOTE — Telephone Encounter (Signed)
Dr. Alen Blew made aware of magnesium of 1.1. Orders placed by Dr. Alen Blew for patient to receive  2 g magnesium IV.

## 2019-10-27 ENCOUNTER — Other Ambulatory Visit: Payer: Self-pay | Admitting: Family Medicine

## 2019-10-29 ENCOUNTER — Ambulatory Visit: Payer: Medicare Other | Admitting: Family Medicine

## 2019-11-05 ENCOUNTER — Other Ambulatory Visit: Payer: Self-pay

## 2019-11-05 ENCOUNTER — Encounter: Payer: Self-pay | Admitting: Family Medicine

## 2019-11-05 ENCOUNTER — Ambulatory Visit (INDEPENDENT_AMBULATORY_CARE_PROVIDER_SITE_OTHER): Payer: Medicare Other | Admitting: Family Medicine

## 2019-11-05 VITALS — BP 136/70 | HR 89 | Temp 98.2°F | Wt 252.2 lb

## 2019-11-05 DIAGNOSIS — Z95828 Presence of other vascular implants and grafts: Secondary | ICD-10-CM

## 2019-11-05 DIAGNOSIS — C679 Malignant neoplasm of bladder, unspecified: Secondary | ICD-10-CM | POA: Diagnosis not present

## 2019-11-05 DIAGNOSIS — E118 Type 2 diabetes mellitus with unspecified complications: Secondary | ICD-10-CM

## 2019-11-05 DIAGNOSIS — E1169 Type 2 diabetes mellitus with other specified complication: Secondary | ICD-10-CM | POA: Diagnosis not present

## 2019-11-05 DIAGNOSIS — I152 Hypertension secondary to endocrine disorders: Secondary | ICD-10-CM

## 2019-11-05 DIAGNOSIS — I700 Atherosclerosis of aorta: Secondary | ICD-10-CM

## 2019-11-05 DIAGNOSIS — I10 Essential (primary) hypertension: Secondary | ICD-10-CM

## 2019-11-05 DIAGNOSIS — E1159 Type 2 diabetes mellitus with other circulatory complications: Secondary | ICD-10-CM

## 2019-11-05 DIAGNOSIS — E785 Hyperlipidemia, unspecified: Secondary | ICD-10-CM

## 2019-11-05 DIAGNOSIS — I2510 Atherosclerotic heart disease of native coronary artery without angina pectoris: Secondary | ICD-10-CM

## 2019-11-05 LAB — POCT GLYCOSYLATED HEMOGLOBIN (HGB A1C): Hemoglobin A1C: 6.2 % — AB (ref 4.0–5.6)

## 2019-11-05 NOTE — Patient Instructions (Signed)
Cut back to 1 Metformin per day

## 2019-11-05 NOTE — Progress Notes (Signed)
Subjective:    Patient ID: Jimmy [name], male    DOB: 01/06/1946, 73 y.o.   MRN: [id number]  Jimmy [name] is a 73 y.o. male who presents for follow-up of Type 2 diabetes mellitus.  Home blood sugar records: meter records 165-226 fasting  Current symptoms/problems include none at this time. Daily foot checks:yes    Any foot concerns: none Exercise: very little Diet: Has been erratic recently as  he has been involved in chemotherapy for treatment of bladder cancer.   He is about to have a procedure for external catheter for his urine.  He seems to be handling this fairly well.  He continues on Glucophage twice per day as well as Victoza.  Review of his record indicates he does have atherosclerosis.  He does not complain of any chest pain, shortness of breath, PND or DOE.  He does have a Port-A-Cath in place The following portions of the patient's history were reviewed and updated as  Appropriate: allergies, current medications, past medical history, past social history and problem list.  ROS as in subjective above.     Objective:    Physical Exam Alert and in no distress otherwise not examined.  There were no vitals taken for this visit.  Lab Review Diabetic Labs Latest Ref Rng & Units 10/16/2019 10/08/2019 09/25/2019 09/17/2019 09/04/2019  HbA1c 4.0 - 5.6 % - - - - -  Microalbumin mg/L - - - - -  Micro/Creat Ratio - - - - - -  Chol 100 - 199 mg/dL - - - - -  HDL >39 mg/dL - - - - -  Calc LDL 0 - 99 mg/dL - - - - -  Triglycerides 0 - 149 mg/dL - - - - -  Creatinine 0.61 - 1.24 mg/dL 1.58(H) 1.46(H) 1.40(H) 1.27(H) 1.15   BP/Weight 10/16/2019 10/12/2019 10/09/2019 10/08/2019 [id number]  Systolic BP [id number]  Diastolic BP 78 70 79 75 75  Wt. (Lbs) - 252.4 - 253 -  BMI - 38.38 - 38.47 -   Foot/eye exam completion dates Latest Ref Rng & Units 01/30/2018 11/01/2016  Eye Exam No Retinopathy No Retinopathy No Retinopathy  Foot Form Completion - - -    [place]  reports that  he quit smoking about 20 years ago. His smoking use included cigarettes. He has a 40.00 pack-year smoking history. He has never used smokeless tobacco. He reports current alcohol use of about 1.0 standard drinks of alcohol per week. He reports that he does not use drugs.     Assessment & Plan:     Malignant neoplasm of urinary bladder, unspecified site ([place])  Type II diabetes mellitus with complication ([facility]) - Plan: HgB A1c  Hyperlipidemia associated with type 2 diabetes mellitus ([place])  Hypertension associated with type 2 diabetes mellitus ([place])  Morbid obesity ([place])  Port-A-Cath in place  Coronary artery disease involving native coronary artery of native heart without angina pectoris  Atherosclerosis of aorta ([place])   1. Rx changes: Decrease Metformin to 1 pill/day 2. Education: Reviewed 'ABCs' of diabetes management (respective goals in parentheses):  A1C (<7), blood pressure (<130/80), and cholesterol (LDL <100). 3. Compliance at present is estimated to be good. Efforts to improve compliance (if necessary) will be directed at increased exercise.  Recommend his a stay as physically active as possible.  I will need to watch him closely since he is going through chemotherapy as well as other surgical procedures. 4. Follow up: 3 months

## 2019-11-06 ENCOUNTER — Other Ambulatory Visit: Payer: Self-pay

## 2019-11-06 ENCOUNTER — Inpatient Hospital Stay: Payer: Medicare Other | Attending: Oncology | Admitting: Oncology

## 2019-11-06 ENCOUNTER — Inpatient Hospital Stay: Payer: Medicare Other

## 2019-11-06 VITALS — BP 141/76 | HR 93 | Temp 98.7°F | Resp 18 | Ht 68.0 in | Wt 251.4 lb

## 2019-11-06 DIAGNOSIS — Z79899 Other long term (current) drug therapy: Secondary | ICD-10-CM | POA: Diagnosis not present

## 2019-11-06 DIAGNOSIS — Z9221 Personal history of antineoplastic chemotherapy: Secondary | ICD-10-CM | POA: Insufficient documentation

## 2019-11-06 DIAGNOSIS — C679 Malignant neoplasm of bladder, unspecified: Secondary | ICD-10-CM

## 2019-11-06 LAB — CBC WITH DIFFERENTIAL (CANCER CENTER ONLY)
Abs Immature Granulocytes: 0.03 10*3/uL (ref 0.00–0.07)
Basophils Absolute: 0.1 10*3/uL (ref 0.0–0.1)
Basophils Relative: 1 %
Eosinophils Absolute: 0.1 10*3/uL (ref 0.0–0.5)
Eosinophils Relative: 2 %
HCT: 31.3 % — ABNORMAL LOW (ref 39.0–52.0)
Hemoglobin: 9.9 g/dL — ABNORMAL LOW (ref 13.0–17.0)
Immature Granulocytes: 1 %
Lymphocytes Relative: 16 %
Lymphs Abs: 0.7 10*3/uL (ref 0.7–4.0)
MCH: 31.8 pg (ref 26.0–34.0)
MCHC: 31.6 g/dL (ref 30.0–36.0)
MCV: 100.6 fL — ABNORMAL HIGH (ref 80.0–100.0)
Monocytes Absolute: 0.6 10*3/uL (ref 0.1–1.0)
Monocytes Relative: 14 %
Neutro Abs: 3 10*3/uL (ref 1.7–7.7)
Neutrophils Relative %: 66 %
Platelet Count: 159 10*3/uL (ref 150–400)
RBC: 3.11 MIL/uL — ABNORMAL LOW (ref 4.22–5.81)
RDW: 22.8 % — ABNORMAL HIGH (ref 11.5–15.5)
WBC Count: 4.5 10*3/uL (ref 4.0–10.5)
nRBC: 0 % (ref 0.0–0.2)

## 2019-11-06 LAB — CMP (CANCER CENTER ONLY)
ALT: 31 U/L (ref 0–44)
AST: 51 U/L — ABNORMAL HIGH (ref 15–41)
Albumin: 3.5 g/dL (ref 3.5–5.0)
Alkaline Phosphatase: 101 U/L (ref 38–126)
Anion gap: 13 (ref 5–15)
BUN: 17 mg/dL (ref 8–23)
CO2: 24 mmol/L (ref 22–32)
Calcium: 8.9 mg/dL (ref 8.9–10.3)
Chloride: 105 mmol/L (ref 98–111)
Creatinine: 1.45 mg/dL — ABNORMAL HIGH (ref 0.61–1.24)
GFR, Est AFR Am: 55 mL/min — ABNORMAL LOW (ref >60)
GFR, Estimated: 47 mL/min — ABNORMAL LOW (ref >60)
Glucose, Bld: 194 mg/dL — ABNORMAL HIGH (ref 70–99)
Potassium: 4.6 mmol/L (ref 3.5–5.1)
Sodium: 142 mmol/L (ref 135–145)
Total Bilirubin: 0.5 mg/dL (ref 0.3–1.2)
Total Protein: 7.1 g/dL (ref 6.5–8.1)

## 2019-11-06 LAB — MAGNESIUM: Magnesium: 1.5 mg/dL — ABNORMAL LOW (ref 1.7–2.4)

## 2019-11-06 NOTE — Progress Notes (Signed)
Hematology and Oncology Follow Up Visit  Jimmy [name] [id number] 22-May-1946 73 y.o. 11/06/2019 1:14 PM Jimmy [name], MDLalonde, Jimmy Jarvis, MD   Principle Diagnosis: 73 year old man with bladder cancer diagnosed in June 2020.  He was found to have T2N0 high-grade urothelial carcinoma without any evidence of metastatic disease.   Prior Therapy: TURBT with right ureteral stent placement as well as left ureteral stent placement at that time.  The final pathology showed infiltrating high-grade papillary adenocarcinoma invading into the detrusor muscle with lymphovascular invasion identified.  This was completed on 06/22/2019.    Neoadjuvant chemotherapy started on August 07, 2019 with cisplatin and gemcitabine started on August 07, 2019.  He completed 4 cycles of chemotherapy on October 16, 2019.  Current therapy: He is under evaluation for radical cystectomy tentatively scheduled for December 2020.  Interim History: Jimmy [name] is here for repeat evaluation.  Since the last visit, he reports no major changes in his health.  He has tolerated chemotherapy without any major complications.  He denies any nausea, vomiting or worsening neuropathy.  He does report some fatigue and some tiredness but for the most part has remained active and attends activities of daily living.   He denied headaches, blurry vision, syncope or seizures.  Denies any fevers, chills or sweats.  Denied chest pain, palpitation, orthopnea or leg edema.  Denied cough, wheezing or hemoptysis.  Denied nausea, vomiting or abdominal pain.  Denies any constipation or diarrhea.  Denies any frequency urgency or hesitancy.  Denies any arthralgias or myalgias.  Denies any skin rashes or lesions.  Denies any bleeding or clotting tendency.  Denies any easy bruising.  Denies any hair or nail changes.  Denies any anxiety or depression.  Remaining review of system is negative.                 Medications:  Current Outpatient  Medications  Medication Sig Dispense Refill  . B-D UF III MINI PEN NEEDLES 31G X 5 MM MISC USE TO INJECT INSULIN EVERY DAY 100 each 5  . cholecalciferol (VITAMIN D) 1000 UNITS tablet Take 5,000 Units by mouth daily.    . Coenzyme Q10 (COQ10 PO) Take 300 mg by mouth daily.     Jimmy [name] Ethyl (VASCEPA) 1 g CAPS Take 2 capsules (2 g total) by mouth 2 (two) times a day. 360 capsule 3  . lidocaine-prilocaine (EMLA) cream Apply 1 application topically as needed. 30 g 0  . liraglutide (VICTOZA) 18 MG/3ML SOPN Inject 0.3 mLs (1.8 mg total) into the skin daily. 9 pen 1  . losartan (COZAAR) 100 MG tablet Take 1 tablet (100 mg total) by mouth daily. 90 tablet 3  . Magnesium 250 MG TABS Take 1 tablet (250 mg total) by mouth daily. 14 tablet 0  . metFORMIN (GLUCOPHAGE) 1000 MG tablet TAKE 1 TABLET BY MOUTH TWICE A DAY WITH A MEAL 180 tablet 1  . Multiple Vitamins-Minerals (MULTIVITAMIN WITH MINERALS) tablet Take 1 tablet by mouth daily.      . ONE TOUCH ULTRA TEST test strip USE TO TEST BLOOD SUGAR DAILY AS DIRECTED BY DOCTOR 100 each 3  . ONETOUCH DELICA LANCETS 99991111 MISC USE AS DIRECTED TO TEST BLOOD SUGAR EVERY DAY 100 each 2  . Pitavastatin Calcium (LIVALO) 4 MG TABS Take 1 tablet (4 mg total) by mouth daily. 90 tablet 3  . prochlorperazine (COMPAZINE) 10 MG tablet Take 1 tablet (10 mg total) by mouth every 6 (six) hours as needed for nausea  or vomiting. 30 tablet 0   No current facility-administered medications for this visit.      Allergies:  Allergies  Allergen Reactions  . Ace Inhibitors Cough  . Cephalexin   . Crestor [Rosuvastatin Calcium]   . Lipitor [Atorvastatin Calcium] Other (See Comments)    MUSCLE PAINS   . Plavix [Clopidogrel Bisulfate] Hives    Past Medical History, Surgical history, Social history, and Family History updated without changes.   Physical Exam: Blood pressure (!) 141/76, pulse 93, temperature 98.7 F (37.1 C), temperature source Temporal, resp. rate 18,  height 5\' 8"  (1.727 m), weight 251 lb 6.4 oz (114 kg), SpO2 96 %.     ECOG: 0    [name] appearance: Comfortable appearing without any discomfort Head: Normocephalic without any trauma Oropharynx: Mucous membranes are moist and pink without any thrush or ulcers. Eyes: Pupils are equal and round reactive to light. Lymph nodes: No cervical, supraclavicular, inguinal or axillary lymphadenopathy.   Heart:regular rate and rhythm.  S1 and S2 without leg edema. [name]: Clear without any rhonchi or wheezes.  No dullness to percussion. Abdomin: Soft, nontender, nondistended with good bowel sounds.  No hepatosplenomegaly. Musculoskeletal: No joint deformity or effusion.  Full range of motion noted. Neurological: No deficits noted on motor, sensory and deep tendon reflex exam. Skin: No petechial rash or dryness.  Appeared moist.        Lab Results: Lab Results  Component Value Date   WBC 4.5 11/06/2019   HGB 9.9 (L) 11/06/2019   HCT 31.3 (L) 11/06/2019   MCV 100.6 (H) 11/06/2019   PLT 159 11/06/2019     Chemistry      Component Value Date/Time   NA 144 10/16/2019 0833   NA 141 06/03/2019 1157   K 4.0 10/16/2019 0833   CL 106 10/16/2019 0833   CO2 23 10/16/2019 0833   BUN 29 (H) 10/16/2019 0833   BUN 19 06/03/2019 1157   CREATININE 1.58 (H) 10/16/2019 0833   CREATININE 0.89 11/19/2016 0920      Component Value Date/Time   CALCIUM 8.8 (L) 10/16/2019 0833   ALKPHOS 67 10/16/2019 0833   AST 48 (H) 10/16/2019 0833   ALT 33 10/16/2019 0833   BILITOT 0.4 10/16/2019 0833        Impression and Plan:   73 year old man with:  1.    Bladder cancer diagnosed in June 2020 after presenting with T2N0 high-grade urothelial carcinoma.     He is status post 4 cycles of neoadjuvant chemotherapy with excellent response and tolerance.  Repeat imaging studies completed under the care of Dr. Alinda Money showed no evidence of metastatic disease.  The natural course of this disease and future  treatment options were discussed.  At this time he will proceed with radical cystectomy as scheduled in December and will return after that in January.  I do not anticipate any additional therapy at this time unless he has metastatic disease in the future.  Different salvage therapy options include immunotherapy but also reiterated.   2. IV access:Port-A-Cath will continue to be in place at this time till postoperatively.  This will be potentially removed pending the results of her surgery.  3. Antiemetics: No residual nausea reported at this time antiemetics still available to him if needed.   4. Renal function surveillance: His creatinine clearance continues to be stable around the 25 cc/min.  5. Follow-up:  No return in January 2020 for reevaluation.  25  minutes was spent with the patient face-to-face  today.  More than 50% of time was dedicated to reviewing his disease status, treatment plan, complications and future therapies as well as the plan of care.      Zola Button, MD 11/6/20201:14 PM

## 2019-11-09 ENCOUNTER — Telehealth: Payer: Self-pay | Admitting: Oncology

## 2019-11-09 NOTE — Telephone Encounter (Signed)
Scheduled appt per 11/6 los.  Spoke with pt spouse and she is aware of the appt date and time.

## 2019-12-01 ENCOUNTER — Other Ambulatory Visit: Payer: Self-pay | Admitting: Family Medicine

## 2019-12-01 DIAGNOSIS — E118 Type 2 diabetes mellitus with unspecified complications: Secondary | ICD-10-CM

## 2019-12-04 NOTE — Patient Instructions (Addendum)
DUE TO COVID-19 ONLY ONE VISITOR IS ALLOWED TO COME WITH YOU AND STAY IN THE WAITING ROOM ONLY DURING PRE OP AND PROCEDURE DAY OF SURGERY. THE 1 VISITOR MAY VISIT WITH YOU AFTER SURGERY IN YOUR PRIVATE ROOM DURING VISITING HOURS ONLY!    ONCE YOUR COVID TEST IS COMPLETED, PLEASE BEGIN THE QUARANTINE INSTRUCTIONS AS OUTLINED IN YOUR HANDOUT.                Jimmy [name]    Your procedure is scheduled on: 12/10/19   Report to [facility] Main  Entrance   Report to admitting at  9:15 AM     Call this number if you have problems the morning of surgery [phone]    Remember: Do not eat food or drink liquids :After Midnight  . BRUSH YOUR TEETH MORNING OF SURGERY AND RINSE YOUR MOUTH OUT, NO CHEWING GUM CANDY OR MINTS.     Take these medicines the morning of surgery with A SIP OF WATER: none  DO NOT TAKE ANY DIABETIC MEDICATIONS DAY OF YOUR SURGERY        How to Manage Your Diabetes Before and After Surgery  Why is it important to control my blood sugar before and after surgery? . Improving blood sugar levels before and after surgery helps healing and can limit problems. . A way of improving blood sugar control is eating a healthy diet by: o  Eating less sugar and carbohydrates o  Increasing activity/exercise o  Talking with your doctor about reaching your blood sugar goals . High blood sugars (greater than 180 mg/dL) can raise your risk of infections and slow your recovery, so you will need to focus on controlling your diabetes during the weeks before surgery. . Make sure that the doctor who takes care of your diabetes knows about your planned surgery including the date and location.  How do I manage my blood sugar before surgery? . Check your blood sugar at least 4 times a day, starting 2 days before surgery, to make sure that the level is not too high or low. o Check your blood sugar the morning of your surgery when you wake up and every 2 hours until you get to  the Short Stay unit. . If your blood sugar is less than 70 mg/dL, you will need to treat for low blood sugar: o Do not take insulin. o Treat a low blood sugar (less than 70 mg/dL) with  cup of clear juice (cranberry or apple), 4 glucose tablets, OR glucose gel. o Recheck blood sugar in 15 minutes after treatment (to make sure it is greater than 70 mg/dL). If your blood sugar is not greater than 70 mg/dL on recheck, call [phone] for further instructions. . Report your blood sugar to the short stay nurse when you get to Short Stay.  . If you are admitted to the hospital after surgery: o Your blood sugar will be checked by the staff and you will probably be given insulin after surgery (instead of oral diabetes medicines) to make sure you have good blood sugar levels. o The goal for blood sugar control after surgery is 80-180 mg/dL.   WHAT DO I DO ABOUT MY DIABETES MEDICATION?  [name] Do not take oral diabetes medicines (pills) the morning of surgery.  The day of surgery, do not take other diabetes injectables, including Byetta (exenatide), Bydureon (exenatide ER), Victoza (liraglutide), or Trulicity (dulaglutide).  [name]  You may not have any metal on your body including piercings              Do not wear jewelry,  lotions, powders or  deodorant        .              Men may shave face and neck.   Do not bring valuables to the hospital. [place].  Contacts, dentures or bridgework may not be worn into surgery.        Name and phone number of your driver:  Special Instructions: N/A              Please read over the following fact sheets you were given: _____________________________________________________________________             [facility] - Preparing for Surgery  Before surgery, you can play an important role.   Because skin is not sterile, your skin needs to be as free of germs as possible .   You can reduce the number of germs on your skin by washing with CHG (chlorahexidine gluconate) soap before surgery.   CHG is an antiseptic cleaner which kills germs and bonds with the skin to continue killing germs even after washing. Please DO NOT use if you have an allergy to CHG or antibacterial soaps.   If your skin becomes reddened/irritated stop using the CHG and inform your nurse when you arrive at Short Stay.     Please follow these instructions carefully:  1.  Shower with CHG Soap the night before surgery and the  morning of Surgery.  2.  If you choose to wash your hair, wash your hair first as usual with your  normal  shampoo.  3.  After you shampoo, rinse your hair and body thoroughly to remove the  shampoo.                                        4.  Use CHG as you would any other liquid soap.  You can apply chg directly  to the skin and wash                       Gently with a scrungie or clean washcloth.  5.  Apply the CHG Soap to your body ONLY FROM THE NECK DOWN.   Do not use on face/ open                           Wound or open sores. Avoid contact with eyes, ears mouth and genitals (private parts).                       Wash face,  Genitals (private parts) with your normal soap.             6.  Wash thoroughly, paying special attention to the area where your surgery  will be performed.  7.  Thoroughly rinse your body with warm water from the neck down.  8.  DO NOT shower/wash with your normal soap after using and rinsing off  the CHG Soap.             9.  Pat yourself dry with a  clean towel.            10.  Wear clean pajamas.            11.  Place clean sheets on your bed the night of your first shower and do not  sleep with pets. Day of Surgery : Do not apply any lotions/deodorants the morning of surgery.  Please wear clean clothes to the hospital/surgery center.  FAILURE TO FOLLOW THESE INSTRUCTIONS MAY RESULT IN THE CANCELLATION OF YOUR SURGERY PATIENT  SIGNATURE_________________________________  NURSE SIGNATURE__________________________________  ________________________________________________________________________   Jimmy [name]  An incentive spirometer is a tool that can help keep your lungs clear and active. This tool measures how well you are filling your lungs with each breath. Taking long deep breaths may help reverse or decrease the chance of developing breathing (pulmonary) problems (especially infection) following:  A long period of time when you are unable to move or be active. BEFORE THE PROCEDURE   If the spirometer includes an indicator to show your best effort, your nurse or respiratory therapist will set it to a desired goal.  If possible, sit up straight or lean slightly forward. Try not to slouch.  Hold the incentive spirometer in an upright position. INSTRUCTIONS FOR USE  1. Sit on the edge of your bed if possible, or sit up as far as you can in bed or on a chair. 2. Hold the incentive spirometer in an upright position. 3. Breathe out normally. 4. Place the mouthpiece in your mouth and seal your lips tightly around it. 5. Breathe in slowly and as deeply as possible, raising the piston or the ball toward the top of the column. 6. Hold your breath for 3-5 seconds or for as long as possible. Allow the piston or ball to fall to the bottom of the column. 7. Remove the mouthpiece from your mouth and breathe out normally. 8. Rest for a few seconds and repeat Steps 1 through 7 at least 10 times every 1-2 hours when you are awake. Take your time and take a few normal breaths between deep breaths. 9. The spirometer may include an indicator to show your best effort. Use the indicator as a goal to work toward during each repetition. 10. After each set of 10 deep breaths, practice coughing to be sure your lungs are clear. If you have an incision (the cut made at the time of surgery), support your incision when coughing  by placing a pillow or rolled up towels firmly against it. Once you are able to get out of bed, walk around indoors and cough well. You may stop using the incentive spirometer when instructed by your caregiver.  RISKS AND COMPLICATIONS  Take your time so you do not get dizzy or light-headed.  If you are in pain, you may need to take or ask for pain medication before doing incentive spirometry. It is harder to take a deep breath if you are having pain. AFTER USE  Rest and breathe slowly and easily.  It can be helpful to keep track of a log of your progress. Your caregiver can provide you with a simple table to help with this. If you are using the spirometer at home, follow these instructions: [place] IF:   You are having difficultly using the spirometer.  You have trouble using the spirometer as often as instructed.  Your pain medication is not giving enough relief while using the spirometer.  You develop fever of 100.5 F (38.1 C)  or higher. SEEK IMMEDIATE MEDICAL CARE IF:   You cough up bloody sputum that had not been present before.  You develop fever of 102 F (38.9 C) or greater.  You develop worsening pain at or near the incision site. MAKE SURE YOU:   Understand these instructions.  Will watch your condition.  Will get help right away if you are not doing well or get worse. Document Released: 04/29/2007 Document Revised: 03/10/2012 Document Reviewed: 06/30/2007 Exit[employer] Patient Information 2014 [employer], [place].   ________________________________________________________________________  WHAT IS A BLOOD TRANSFUSION? Blood Transfusion Information  A transfusion is the replacement of blood or some of its parts. Blood is made up of multiple cells which provide different functions.  Red blood cells carry oxygen and are used for blood loss replacement.  White blood cells fight against infection.  Platelets control bleeding.  Plasma helps clot  blood.  Other blood products are available for specialized needs, such as hemophilia or other clotting disorders. BEFORE THE TRANSFUSION  Who gives blood for transfusions?   Healthy volunteers who are fully evaluated to make sure their blood is safe. This is blood bank blood. Transfusion therapy is the safest it has ever been in the practice of medicine. Before blood is taken from a donor, a complete history is taken to make sure that person has no history of diseases nor engages in risky social behavior (examples are intravenous drug use or sexual activity with multiple partners). The donor's travel history is screened to minimize risk of transmitting infections, such as malaria. The donated blood is tested for signs of infectious diseases, such as HIV and hepatitis. The blood is then tested to be sure it is compatible with you in order to minimize the chance of a transfusion reaction. If you or a relative donates blood, this is often done in anticipation of surgery and is not appropriate for emergency situations. It takes many days to process the donated blood. RISKS AND COMPLICATIONS Although transfusion therapy is very safe and saves many lives, the main dangers of transfusion include:   Getting an infectious disease.  Developing a transfusion reaction. This is an allergic reaction to something in the blood you were given. Every precaution is taken to prevent this. The decision to have a blood transfusion has been considered carefully by your caregiver before blood is given. Blood is not given unless the benefits outweigh the risks. AFTER THE TRANSFUSION  Right after receiving a blood transfusion, you will usually feel much better and more energetic. This is especially true if your red blood cells have gotten low (anemic). The transfusion raises the level of the red blood cells which carry oxygen, and this usually causes an energy increase.  The nurse administering the transfusion will monitor  you carefully for complications. HOME CARE INSTRUCTIONS  No special instructions are needed after a transfusion. You may find your energy is better. Speak with your caregiver about any limitations on activity for underlying diseases you may have. SEEK MEDICAL CARE IF:   Your condition is not improving after your transfusion.  You develop redness or irritation at the intravenous (IV) site. SEEK IMMEDIATE MEDICAL CARE IF:  Any of the following symptoms occur over the next 12 hours:  Shaking chills.  You have a temperature by mouth above 102 F (38.9 C), not controlled by medicine.  Chest, back, or muscle pain.  People around you feel you are not acting correctly or are confused.  Shortness of breath or difficulty breathing.  Dizziness and  fainting.  You get a rash or develop hives.  You have a decrease in urine output.  Your urine turns a dark color or changes to pink, red, or brown. Any of the following symptoms occur over the next 10 days:  You have a temperature by mouth above 102 F (38.9 C), not controlled by medicine.  Shortness of breath.  Weakness after normal activity.  The white part of the eye turns yellow (jaundice).  You have a decrease in the amount of urine or are urinating less often.  Your urine turns a dark color or changes to pink, red, or brown. Document Released: 12/14/2000 Document Revised: 03/10/2012 Document Reviewed: 08/02/2008 [facility] Patient Information 2014 [place], [place].  _______________________________________________________________________

## 2019-12-07 ENCOUNTER — Other Ambulatory Visit: Payer: Self-pay

## 2019-12-07 ENCOUNTER — Other Ambulatory Visit (HOSPITAL_COMMUNITY)
Admission: RE | Admit: 2019-12-07 | Discharge: 2019-12-07 | Disposition: A | Discharge status: Home / Self Care | Payer: Medicare Other | Source: Ambulatory Visit | Attending: Urology | Admitting: Urology

## 2019-12-07 DIAGNOSIS — Z01812 Encounter for preprocedural laboratory examination: Secondary | ICD-10-CM | POA: Insufficient documentation

## 2019-12-07 DIAGNOSIS — Z20828 Contact with and (suspected) exposure to other viral communicable diseases: Secondary | ICD-10-CM | POA: Insufficient documentation

## 2019-12-07 MED ORDER — ONETOUCH ULTRASOFT LANCETS MISC: 1.0000 | 11 refills | Status: DC | PRN

## 2019-12-08 ENCOUNTER — Encounter (HOSPITAL_COMMUNITY): Payer: Self-pay

## 2019-12-08 ENCOUNTER — Other Ambulatory Visit: Payer: Self-pay

## 2019-12-08 ENCOUNTER — Encounter (HOSPITAL_COMMUNITY)
Admission: RE | Admit: 2019-12-08 | Discharge: 2019-12-08 | Disposition: A | Discharge status: Home / Self Care | Payer: Medicare Other | Source: Ambulatory Visit | Attending: Urology | Admitting: Urology

## 2019-12-08 DIAGNOSIS — E119 Type 2 diabetes mellitus without complications: Secondary | ICD-10-CM | POA: Insufficient documentation

## 2019-12-08 DIAGNOSIS — Z79899 Other long term (current) drug therapy: Secondary | ICD-10-CM | POA: Insufficient documentation

## 2019-12-08 DIAGNOSIS — I2510 Atherosclerotic heart disease of native coronary artery without angina pectoris: Secondary | ICD-10-CM | POA: Insufficient documentation

## 2019-12-08 DIAGNOSIS — C679 Malignant neoplasm of bladder, unspecified: Secondary | ICD-10-CM | POA: Insufficient documentation

## 2019-12-08 DIAGNOSIS — Z87891 Personal history of nicotine dependence: Secondary | ICD-10-CM | POA: Insufficient documentation

## 2019-12-08 DIAGNOSIS — G4730 Sleep apnea, unspecified: Secondary | ICD-10-CM | POA: Insufficient documentation

## 2019-12-08 DIAGNOSIS — Z01812 Encounter for preprocedural laboratory examination: Secondary | ICD-10-CM | POA: Insufficient documentation

## 2019-12-08 LAB — BASIC METABOLIC PANEL
Anion gap: 11 (ref 5–15)
BUN: 19 mg/dL (ref 8–23)
CO2: 22 mmol/L (ref 22–32)
Calcium: 9.6 mg/dL (ref 8.9–10.3)
Chloride: 108 mmol/L (ref 98–111)
Creatinine, Ser: 1.32 mg/dL — ABNORMAL HIGH (ref 0.61–1.24)
GFR calc Af Amer: >60 mL/min (ref >60)
GFR calc non Af Amer: 53 mL/min — ABNORMAL LOW (ref >60)
Glucose, Bld: 157 mg/dL — ABNORMAL HIGH (ref 70–99)
Potassium: 4.7 mmol/L (ref 3.5–5.1)
Sodium: 141 mmol/L (ref 135–145)

## 2019-12-08 LAB — CBC
HCT: 39.3 % (ref 39.0–52.0)
Hemoglobin: 11.9 g/dL — ABNORMAL LOW (ref 13.0–17.0)
MCH: 31.7 pg (ref 26.0–34.0)
MCHC: 30.3 g/dL (ref 30.0–36.0)
MCV: 104.8 fL — ABNORMAL HIGH (ref 80.0–100.0)
Platelets: 130 10*3/uL — ABNORMAL LOW (ref 150–400)
RBC: 3.75 MIL/uL — ABNORMAL LOW (ref 4.22–5.81)
RDW: 14.8 % (ref 11.5–15.5)
WBC: 5.1 10*3/uL (ref 4.0–10.5)
nRBC: 0 % (ref 0.0–0.2)

## 2019-12-08 LAB — NOVEL CORONAVIRUS, NAA (HOSP ORDER, SEND-OUT TO REF LAB; TAT 18-24 HRS): SARS-CoV-2, NAA: NOT DETECTED

## 2019-12-08 LAB — ABO/RH: ABO/RH(D): O POS

## 2019-12-08 LAB — HEMOGLOBIN A1C
Hgb A1c MFr Bld: 6.2 % — ABNORMAL HIGH (ref 4.8–5.6)
Mean Plasma Glucose: 131.24 mg/dL

## 2019-12-08 LAB — GLUCOSE, CAPILLARY: Glucose-Capillary: 172 mg/dL — ABNORMAL HIGH (ref 70–99)

## 2019-12-08 NOTE — Consult Note (Signed)
[place] Nurse ostomy consult note  [place] Nurse requested for preoperative stoma site marking by Dr. Alinda Money.  Discussed surgical procedure and stoma creation with patient.  Explained role of the [place] nurse team.  Answered patient's questions.   Examined patient lying, sitting, and standing in order to place the marking in the patient's visual field, away from any creases or abdominal contour issues and within the rectus muscle.  Patient has a large, soft, rotund abdomen. He wears his pants very low (beneath) the abdomen.  Two sites are marked for the permanent ileal conduit:  a first and second choice.  They are labeled with the skin marking pen next to the [facility]).  Site #1 is distal to Site #2. While patient cannot see site #1 at rest, it is preferred for tucking pouch into pants.  He can see site if he uses the nondominant index finger to apply traction to the skin just proximal to 12 o'clock of the mark.  If site #2 is selected, there is increased risk of traction on bowel and a flush or recessed/retracted stoma.  Site #1:  Marked for ileal conduit in the RUQ  7 cm to the right of the umbilicus and  [id number] above the umbilicus.  Site #2: Marked for ileal conduit in the RUQ  7 cm to the right of the umbilicus and  0000000 above the umbilicus.  Patient's abdomen cleansed with CHG wipes at site markings, allowed to air dry prior to marking.Covered marks with thin film transparent dressing to preserve mark until date of surgery (12/10/19).   [place] Nurse team will follow up with patient after surgery for continue ostomy care and teaching.   Thank you,  Maudie Flakes, MSN, RN, [place], Arther Abbott  Pager# [phone]
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
## 2019-12-09 NOTE — H&P (Signed)
1. Muscle invasive urothelial carcinoma the bladder  2. Locally advanced prostate cancer   Jimmy [name] returns today after his visit to Dr. Alen Blew. He had presented with gross hematuria and office cystoscopy revealed findings concerning for malignancy of the trigone. On his preoperative CT scan of the abdomen and pelvis, he did not have any evidence of lymphadenopathy or metastatic disease. He did have right-sided hydronephrosis consistent with ureteral obstruction distally. At the time of his procedure, he is actually felt to have developing ureteral obstruction bilaterally and had bilateral ureteral stents placed. His serum creatinine on 06/29/2019 was 1.2. He underwent resection of a large tumor along the trigone of the bladder. Pathology indicated a high-grade, muscle invasive tumor infiltrating the muscularis propria and with evidence of lamina propria invasion. CT imaging of the chest and abdomen/pelvis is negative for metastatic disease. He saw Dr. Alen Blew on 07/21/19 to discuss his options and to consider neoadjuvant chemotherapy. He has completed 4 cycles of gemcitabine/cisplatin. He follows up today in preparation for radical cystectomy after having undergone repeat staging studies. He has tolerated chemotherapy relatively well. He has had expected fatigue but has seeing this begin to improve. His last cycle was on October 8th.   His medical history is significant for locally advanced, high risk prostate cancer status post combination external beam radiation therapy and radiation seed implant in 2009 along with 2 years of androgen deprivation therapy. He has been disease free since treatment. He also has a history of hyperlipidemia, hypertension, sleep apnea, and diabetes. He has a history of nonobstructive coronary artery disease initially diagnosed during a cardiac catheterization in 2000. He was most recently seen by Dr. Candee Furbish in January 2019. He underwent an evaluation including a stress  test and echocardiogram that was low risk. LV EF was > 65%. He was recently reevaluated by Dr. Marlou Porch who feels no further testing is necessary and he can proceed with surgery with a mild to moderate risk of perioperative complications.     ALLERGIES: ACE Inhibitors atorvastatin Cephalexin clopidogrel Doxycycline Plavix TABS rosuvastatin    MEDICATIONS: Compazine  Coq10  Irbesartan  Livalo 4 mg tablet Oral  Losartan Potassium  Magnesium  Metformin Hcl 1,000 mg tablet 0 Oral  Multiple Daily W/Beta tablet Oral  Multiple Vitamin  Vascepa 1 gram capsule  Victoza 3-Pak 0.6 mg/0.1 ml (18 mg/3 ml) pen injector 0 Subcutaneous  Vitamin D 2000 UNIT Oral Tablet Oral     GU PSH: Cystoscopy - 06/17/2019, 2017 Cystoscopy Insert Stent, Bilateral - 06/22/2019 Cystoscopy TURBT >5 cm - 06/22/2019 Locm 300-399Mg /Ml Iodine,1Ml - 11/02/2019, 07/01/2019, 06/18/2019, 2017 TRANSPERI NEEDLE PLACE, PROS - 2009       [place] Notes: Surgery Prostate Transperineal Placement Of Needles   NON-GU PSH: None   GU PMH: Bladder Cancer overlapping sites - 07/07/2019 Ureteral obstruction - 07/07/2019 Bladder tumor/neoplasm - 06/17/2019 Weak Urinary Stream - 05/29/2019 Renal calculus - 05/28/2018 Gross hematuria - 2017 BPH w/LUTS, Benign prostatic hyperplasia (BPH) with straining on urination - 2017 Prostate Cancer, Prostate cancer - 2017 History of prostate cancer, Prostate Cancer - 2014 Overactive bladder, Overactive bladder - 2014 Primary hypogonadism, Hypogonadism, testicular - 2014      PMH Notes:   1) Prostate cancer: He is s/p treatment with radiation therapy/androgen deprivation. He began 5 weeks of external beam radiation in January 2009 and had a radiation seed implant on March 29, 2008. He completed 2 years of androgen deprivation completed in the fall of 2010.   TNM stage: cT3a  N0 M0  Staging studies: CT and bone scan negative at diagnosis  Gleason score: 4+4=8  Presenting PSA: 5.35   2) Testosterone  deficiency/osteoporosis: He began androgen deprivation with Lupron in November 2008. He was diagnosed with osteoporosis in 2012. He was treated with Prolia for 2 years and his bone density improved. He stopped Prolia in May 2014.   Current treatment: Vitamin D and Calcium supplementation  Last Dexascan: May 2014 - osteopenia   3) BPH/LUTS: Baseline symptoms include hesitancy, frequency, urgency, and urge incontinence. These symptoms worsened after radiation therapy but subsequently improved again.   Current treatment: None  Prior treatment: Tamsulosin, Detrol (able to successfully discontinue)   4) Hematuria: He developed painless gross hematuria in June 2017. He has a history of radiation therapy for prostate cancer. Former smoker. His hematuria recurred in May 2020 prompting further evaluation.   Jun/Jul 2017: CT - right renal calculi, Cysto - Negative except for friable prostate  Jun 2020: CT imaging - large bladder mass and left hydronephrosis, Cystoscopy - bladder trigone tumor  Jun 2020: TURBT and left ureteral stent   5) Urolithiasis: He passed a kidney stone in 2019. He has known non-obstructing right renal calculi.   NON-GU PMH: Bacteriuria (Stable) - 07/28/2019, - 07/07/2019 Coronary Artery Disease Diabetes Type 2 Hypercholesterolemia Osteoporosis Sleep Apnea    FAMILY HISTORY: Chronic Obstructive Pulmonary Disease - Father Renal Cell Carcinoma - Runs In Family   SOCIAL HISTORY: Marital Status: Married Preferred Language: English; Ethnicity: Not Hispanic Or Latino; Race: White Current Smoking Status: Patient does not smoke anymore.  Has never drank.  Does not drink caffeine.     Notes: Former smoker, Tobacco Use, Occupation:, Marital History - Currently Married   REVIEW OF SYSTEMS:    GU Review Male:   Patient reports frequent urination. Patient denies hard to postpone urination, burning/ pain with urination, get up at night to urinate, leakage of urine, stream starts and  stops, trouble starting your streams, and have to strain to urinate .  Gastrointestinal (Lower):   Patient denies diarrhea and constipation.  Gastrointestinal (Upper):   Patient denies nausea and vomiting.  Constitutional:   Patient denies fever, night sweats, weight loss, and fatigue.  Skin:   Patient denies skin rash/ lesion and itching.  Eyes:   Patient denies blurred vision and double vision.  Ears/ Nose/ Throat:   Patient denies sinus problems and sore throat.  Hematologic/Lymphatic:   Patient denies swollen glands and easy bruising.  Cardiovascular:   Patient denies leg swelling and chest pains.  Respiratory:   Patient denies cough and shortness of breath.  Endocrine:   Patient denies excessive thirst.  Musculoskeletal:   Patient denies back pain and joint pain.  Neurological:   Patient denies headaches and dizziness.  Psychologic:   Patient denies depression and anxiety.   VITAL SIGNS:      11/03/2019 09:38 AM  Weight 249 lb / 112.94 kg  Height 68 in / 172.72 cm  BP 159/76 mmHg  Pulse 102 /min  Temperature 97.8 F / 36.5 C  BMI 37.9 kg/m   MULTI-SYSTEM PHYSICAL EXAMINATION:    Constitutional: Well-nourished. No physical deformities. Normally developed. Good grooming.  Neck: Neck symmetrical, not swollen. Normal tracheal position.  Respiratory: No labored breathing, no use of accessory muscles. Clear bilaterally.  Cardiovascular: Normal temperature, normal extremity pulses, no swelling, no varicosities. Regular rate and rhythm.  Lymphatic: No enlargement of neck, axillae, groin.  Skin: No paleness, no jaundice, no cyanosis. No lesion, no  ulcer, no rash.  Neurologic / Psychiatric: Oriented to time, oriented to place, oriented to person. No depression, no anxiety, no agitation.  Gastrointestinal: Obese, soft, nontender, no masses.  Eyes: Normal conjunctivae. Normal eyelids.  Ears, Nose, Mouth, and Throat: Left ear no scars, no lesions, no masses. Right ear no scars, no lesions,  no masses. Nose no scars, no lesions, no masses. Normal hearing. Normal lips.  Musculoskeletal: Normal gait and station of head and neck.     PAST DATA REVIEWED:  Source Of History:  Patient  Records Review:   Pathology Reports  Urine Test Review:   Urinalysis  X-Ray Review: C.T. Chest/ Abd/Pelvis: Reviewed Films.     05/22/19 05/21/18 05/15/17 05/10/16 05/28/15 05/15/14 05/05/13 10/31/12  PSA  Total PSA <0.015 ng/mL <0.015 ng/mL < 0.015 ng/dl <0.01  <0.01  <0.01  <0.01  <0.01     10/31/10  Hormones  Testosterone, Total 129.73    Notes:                     CLINICAL DATA: Bladder cancer (high-grade urothelial cancer) post  TURBT 06/22/2019. Subsequent chemotherapy. Remote history of  prostate cancer.   EXAM:  CT CHEST, ABDOMEN, AND PELVIS WITH CONTRAST   TECHNIQUE:  Multidetector CT imaging of the chest, abdomen and pelvis was  performed following the standard protocol during bolus  administration of intravenous contrast.   CONTRAST: 100 cc Omnipaque 300   COMPARISON: Chest CT 07/01/2019. Abdominopelvic CT 06/18/2019.   FINDINGS:  CT CHEST FINDINGS   Cardiovascular: Mild atherosclerosis of the aorta, great vessels and  coronary arteries. Right IJ Port-A-Cath extends to the lower SVC  level. No acute vascular findings. There are collateral vessels  within the right lateral chest wall. The heart size is normal. There  is no pericardial effusion.   Mediastinum/Nodes: Stable 12 mm subcarinal node on image 28/2. No  other enlarged mediastinal, hilar or axillary lymph nodes. The  thyroid gland, trachea and esophagus demonstrate no significant  findings.   Lungs/Pleura: There is no pleural effusion. At least moderate  centrilobular and paraseptal emphysema with scattered subpleural  reticulation. Stable scattered small pulmonary nodules, including a  3 mm subpleural nodule in the left upper lobe on image 28/3. There  is a small calcified granuloma in the right lower lobe.  No new or  enlarging pulmonary nodules.   Musculoskeletal/Chest wall: No chest wall mass or suspicious osseous  findings.   CT ABDOMEN AND PELVIS FINDINGS   Hepatobiliary: Stable contour irregularity of the liver with  relative enlargement of the caudate and left lobes, suspicious for  cirrhosis. No focal lesions or abnormal enhancement identified. No  evidence of gallstones, gallbladder wall thickening or biliary  dilatation.   Pancreas: Unremarkable. No pancreatic ductal dilatation or  surrounding inflammatory changes.   Spleen: Stable mild splenomegaly. No focal abnormality.   Adrenals/Urinary Tract: Both adrenal glands appear normal. Interval  bilateral double-J ureteral stent placement in good position. There  is improvement in the previously demonstrated left-sided  hydronephrosis and hydroureter. There is mild intrarenal collecting  system bilaterally, but no significant delay in contrast excretion  or urinary tract calculus. No residual bladder mass identified.  There is mild bladder wall thickening. Small left renal cysts are  grossly stable.   Stomach/Bowel: No evidence of bowel wall thickening, distention or  surrounding inflammatory change.   Vascular/Lymphatic: There are no enlarged abdominal or pelvic lymph  nodes. Mild aortic and branch vessel atherosclerosis. No acute  vascular  findings.   Reproductive: Stable prostate brachytherapy seeds.   Other: Stable soft tissue stranding in the subcutaneous fat around  the umbilicus, attributed to subcutaneous injections. No hernia or  significant ascites.   Musculoskeletal: No acute or significant osseous findings. Chronic  left femoral head avascular necrosis without subchondral collapse.  Mild lumbar spondylosis.   IMPRESSION:  1. Interval trans urethral bladder tumor resection and bilateral  ureteral stent placement with improvement in previously demonstrated  left-sided hydronephrosis and hydroureter. No  residual bladder mass  identified or evidence of metastatic disease.  2. Stable mildly enlarged subcarinal lymph node.  3. Suspected hepatic cirrhosis with stable mild splenomegaly.  4. Chronic left femoral head avascular necrosis without subchondral  collapse.  5. Aortic Atherosclerosis (ICD10-I70.0) and Emphysema (ICD10-J43.9).    Electronically Signed  By: Richardean Sale M.D.  On: 11/02/2019 15:22   PROCEDURES:          Urinalysis w/Scope Dipstick Dipstick Cont'd Micro  Color: Yellow Bilirubin: Neg mg/dL WBC/hpf: 0 - 5/hpf  Appearance: Cloudy Ketones: Neg mg/dL RBC/hpf: 20 - 40/hpf  Specific Gravity: 1.020 Blood: 3+ ery/uL Bacteria: NS (Not Seen)  pH: 6.0 Protein: 2+ mg/dL Cystals: NS (Not Seen)  Glucose: Neg mg/dL Urobilinogen: 0.2 mg/dL Casts: NS (Not Seen)    Nitrites: Neg Trichomonas: Not Present    Leukocyte Esterase: 1+ leu/uL Mucous: Not Present      Epithelial Cells: 0 - 5/hpf      Yeast: NS (Not Seen)      Sperm: Not Present    ASSESSMENT:      ICD-10 Details  1 GU:   Bladder Cancer overlapping sites - C67.8   2   History of prostate cancer - Z85.46    PLAN:           Schedule Return Visit/Planned Activity: Keep Scheduled Appointment          Document Letter(s):  Created for Patient: Clinical Summary         Notes:   1. Muscle invasive urothelial carcinoma the bladder with history of locally advanced prostate cancer status post radiation therapy: We reviewed his imaging studies today indicating an excellent response to neoadjuvant chemotherapy. He currently has no evidence of metastatic disease or obvious progression of his urothelial cancer. His indwelling stents are noted. He is prepared to proceed with surgical treatment in early December which will allow him some more time to recover from chemotherapy. He is scheduled to undergo a robot assisted laparoscopic cysto prostatectomy, bilateral pelvic lymphadenectomy, and ileal conduit urinary diversion. We have  reviewed the potential risks and complications of this procedure in detail. With regard to radical cystectomy, we have discussed the risks of general anesthesia including cardiovascular risks such as heart attack, stroke, venous thromboembolism, etc. We have discussed the risks of damage to adjacent structures in the pelvis including but not limited to the bowel, rectum, major neurovascular structures, etc. He understands that he is at a slightly increased risk of rectal injury with his prior history of radiation therapy to the prostate. We have discussed the risks of lymphocele related to extensive lymph node dissection. Furthermore, we have discussed his planned urinary diversion in detail today. We have discussed the risks of bowel obstruction, bowel leak, need for further surgery, risk of ureteral stricture, stomal stenosis, parastomal hernia, etc. All questions were answered to his stated satisfaction. He will plan to proceed as scheduled.

## 2019-12-09 NOTE — Progress Notes (Signed)
Anesthesia Chart Review   Case: [id number] Date/Time: 12/10/19 1100   Procedure: ROBOT ASSISTED LAPAROSCOPIC RADICAL CYSTOPROSTATECTOMY BILATERAL PELVIC LYMPHADENECTOMY, ILEAL CONDUIT URINARY DIVERSION (N/A )   Anesthesia type: General   Pre-op diagnosis: BLADDER CANCER   Location: [facility] ROOM 03 / [facility]   Surgeon: Raynelle Bring, MD      DISCUSSION:73 y.o. former smoker (40 pack years, quit 08/20/99) with h/o ischemic heart disease, sleep apnea w/cpap, DM II, CAD, prostate cancer, bladder cancer scheduled for above procedure 12/10/2019 with Dr. Raynelle Bring.   Last seen by cardiologist, Dr. Candee Furbish, 10/12/2019.  Per OV note, "He may proceed with radical cystoprostatectomy with mild to moderate overall cardiac risk.  Recent stress test in January 2019 was low risk.  Excellent.  Has motivation to see his grandchildren graduate high school.  Currently undergoing chemotherapy."  Anticipate pt can proceed with planned procedure barring acute status change.   VS: BP (!) 158/72   Pulse 95   Temp 36.8 C   Resp 20   Ht 5\' 8"  (1.727 m)   Wt 110.7 kg   SpO2 96%   BMI 37.10 kg/m   PROVIDERS: [name], MD is PCP last seen 11/05/2019  Candee Furbish, MD is Cardiologist  LABS: Labs reviewed: Acceptable for surgery. (all labs ordered are listed, but only abnormal results are displayed)  Labs Reviewed  GLUCOSE, CAPILLARY - Abnormal; Notable for the following components:      Result Value   Glucose-Capillary 172 (*)    All other components within normal limits  BASIC METABOLIC PANEL - Abnormal; Notable for the following components:   Glucose, Bld 157 (*)    Creatinine, Ser 1.32 (*)    GFR calc non Af Amer 53 (*)    All other components within normal limits  CBC - Abnormal; Notable for the following components:   RBC 3.75 (*)    Hemoglobin 11.9 (*)    MCV 104.8 (*)    Platelets 130 (*)    All other components within normal limits  HEMOGLOBIN A1C - Abnormal; Notable for the  following components:   Hgb A1c MFr Bld 6.2 (*)    All other components within normal limits  TYPE AND SCREEN  ABO/RH     IMAGES: CT Chest 07/01/2019 Impression:  1. Stable 2mm left upper lobe lung nodule.  No new nodules to suggest metastatic disease.   2. Aortic Atherosclerosis (ICD10-I70.0) and Emphysema (ICD10-J43.9).  Coronary artery calcifications   EKG: 06/22/2019 Rate 88 bpm  Sinus rhythm with 1st degree AV block  Otherwise normal ECG No significant change since last tracing   CV: Myocardial Perfusion 01/08/2018  Patient walked on a treadmill test for 4 minutes and 15 seconds. He achieved a peak heart rate of 146 which is 98% of predicted maximal heart rate.  There were no ST or T wave changes to suggest ischemia.  Blood pressure response to exercise was normal.  Nuclear stress EF: 71%. The left ventricular ejection fraction is hyperdynamic (>65%).  The study is normal. This is a low risk study.  Echo 12/19/2017 Study Conclusions  - Left ventricle: The cavity size was normal. Wall thickness was   normal. Systolic function was normal. The estimated ejection   fraction was in the range of 55% to 60%. Wall motion was normal;   there were no regional wall motion abnormalities. Doppler   parameters are consistent with abnormal left ventricular   relaxation (grade 1 diastolic dysfunction). - Left atrium: The atrium  was mildly dilated. - Atrial septum: No defect or patent foramen ovale was identified. Past Medical History:  Diagnosis Date  . Astigmatism of both eyes 11/01/2016  . Bronchitis   . Coronary artery disease   . Cortical age-related cataract of both eyes 11/01/2016  . Diabetes mellitus without complication ([place])    type 2  . Drug-induced osteoporosis   . Dyslipidemia   . Fatigue   . Fatty liver   . History of kidney stones   . Hypercholesteremia   . Hypogonadism male   . Ischemic heart disease    s/p cath in 2000 showing nonobstructive CAD yet  felt to have had a probable dissection of the RCA  . Normal nuclear stress test March 2011  . Nuclear sclerotic cataract of both eyes 11/01/2016  . Obesity   . Prostate cancer [facility])    s/p seed implant and radiation  . Sleep apnea    90% of time dosent wear his cpap per pt  . Vitamin D insufficiency     Past Surgical History:  Procedure Laterality Date  . CARDIAC CATHETERIZATION  April 2000  . IR IMAGING GUIDED PORT INSERTION  07/30/2019  . RADIOACTIVE SEED IMPLANT  2009   Implanted radiation seeds  . thumb surgery     as a teen caught thumb in machine  . TRANSURETHRAL RESECTION OF BLADDER TUMOR N/A 06/22/2019   Procedure: TRANSURETHRAL RESECTION OF BLADDER TUMOR (TURBT), CYSTOSCOPY,  BILATERAL RETROGRADE PYELOGRAPHY, BILATERAL STENT PLACEMENT;  Surgeon: Raynelle Bring, MD;  Location: [facility];  Service: Urology;  Laterality: N/A;  GENERAL WITH PARALYSIS    MEDICATIONS: . B-D UF III MINI PEN NEEDLES 31G X 5 MM MISC  . Cholecalciferol (VITAMIN D) 125 MCG (5000 UT) CAPS  . Icosapent Ethyl (VASCEPA) 1 g CAPS  . Lancets (ONETOUCH ULTRASOFT) lancets  . lidocaine-prilocaine (EMLA) cream  . losartan (COZAAR) 100 MG tablet  . Magnesium 250 MG TABS  . metFORMIN (GLUCOPHAGE) 1000 MG tablet  . Multiple Vitamins-Minerals (MULTIVITAMIN WITH MINERALS) tablet  . ONE TOUCH ULTRA TEST test strip  . Pitavastatin Calcium (LIVALO) 4 MG TABS  . prochlorperazine (COMPAZINE) 10 MG tablet  . VICTOZA 18 MG/3ML SOPN   No current facility-administered medications for this encounter.     Maia Plan [facility] Pre-Surgical Testing [phone] 12/09/19  12:36 PM

## 2019-12-09 NOTE — Anesthesia Preprocedure Evaluation (Addendum)
Anesthesia Evaluation  Patient identified by MRN, date of birth, ID band Patient awake    Reviewed: Allergy & Precautions, NPO status , Patient's Chart, lab work & pertinent test results  Airway Mallampati: II  TM Distance: >3 FB Neck ROM: Full    Dental no notable dental hx. (+) Teeth Intact, Dental Advisory Given   Pulmonary sleep apnea (does not wear CPAP) , former smoker,    Pulmonary exam normal breath sounds clear to auscultation       Cardiovascular hypertension, + CAD and + Peripheral Vascular Disease  Normal cardiovascular exam Rhythm:Regular Rate:Normal  TTE 2018 - Left ventricle: The cavity size was normal. Wall thickness was normal. Systolic function was normal. The estimated ejection fraction was in the range of 55% to 60%. Wall motion was normal; there were no regional wall motion abnormalities. Doppler parameters are consistent with abnormal left ventricular relaxation (grade 1 diastolic dysfunction). - Left atrium: The atrium was mildly dilated. - Atrial septum: No defect or patent foramen ovale was identified. - no significant valvular abnormalities  Stress Test 2019 Patient walked on a treadmill test for 4 minutes and 15 seconds. He achieved a peak heart rate of 146 which is 98% of predicted maximal heart rate. There were no ST or T wave changes to suggest ischemia. Blood pressure response to exercise was normal. Nuclear stress EF: 71%. The left ventricular ejection fraction is hyperdynamic (>65%). The study is normal. This is a low risk study   Neuro/Psych negative neurological ROS  negative psych ROS   GI/Hepatic negative GI ROS, Neg liver ROS,   Endo/Other  negative endocrine ROSdiabetes, Type 2  Renal/GU negative Renal ROS  negative genitourinary   Musculoskeletal negative musculoskeletal ROS (+)   Abdominal   Peds  Hematology negative hematology ROS (+)   Anesthesia Other Findings   Reproductive/Obstetrics                           Anesthesia Physical Anesthesia Plan  ASA: III  Anesthesia Plan: General   Post-op Pain Management:    Induction: Intravenous  PONV Risk Score and Plan: 2 and Midazolam, Dexamethasone and Ondansetron  Airway Management Planned: Oral ETT  Additional Equipment:   Intra-op Plan:   Post-operative Plan: Extubation in OR  Informed Consent: I have reviewed the patients History and Physical, chart, labs and discussed the procedure including the risks, benefits and alternatives for the proposed anesthesia with the patient or authorized representative who has indicated his/her understanding and acceptance.     Dental advisory given  Plan Discussed with: CRNA  Anesthesia Plan Comments:        Anesthesia Quick Evaluation

## 2019-12-10 ENCOUNTER — Encounter (HOSPITAL_COMMUNITY)
Admission: RE | Disposition: A | Discharge status: Home / Self Care | Payer: Self-pay | Source: Home / Self Care | Attending: Urology

## 2019-12-10 ENCOUNTER — Inpatient Hospital Stay (HOSPITAL_COMMUNITY)
Admission: RE | Admit: 2019-12-10 | Discharge: 2019-12-18 | DRG: 654 | Disposition: A | Discharge status: Home Health | Payer: Medicare Other | Attending: Urology | Admitting: Urology

## 2019-12-10 ENCOUNTER — Encounter (HOSPITAL_COMMUNITY): Payer: Self-pay | Admitting: Urology

## 2019-12-10 ENCOUNTER — Inpatient Hospital Stay (HOSPITAL_COMMUNITY): Payer: Medicare Other | Admitting: Anesthesiology

## 2019-12-10 ENCOUNTER — Inpatient Hospital Stay (HOSPITAL_COMMUNITY): Payer: Medicare Other | Admitting: Physician Assistant

## 2019-12-10 DIAGNOSIS — C679 Malignant neoplasm of bladder, unspecified: Principal | ICD-10-CM | POA: Diagnosis present

## 2019-12-10 DIAGNOSIS — C61 Malignant neoplasm of prostate: Secondary | ICD-10-CM | POA: Diagnosis present

## 2019-12-10 DIAGNOSIS — E875 Hyperkalemia: Secondary | ICD-10-CM | POA: Diagnosis present

## 2019-12-10 DIAGNOSIS — Z4659 Encounter for fitting and adjustment of other gastrointestinal appliance and device: Secondary | ICD-10-CM

## 2019-12-10 DIAGNOSIS — Z79899 Other long term (current) drug therapy: Secondary | ICD-10-CM

## 2019-12-10 DIAGNOSIS — K567 Ileus, unspecified: Secondary | ICD-10-CM | POA: Diagnosis not present

## 2019-12-10 DIAGNOSIS — E1151 Type 2 diabetes mellitus with diabetic peripheral angiopathy without gangrene: Secondary | ICD-10-CM | POA: Diagnosis present

## 2019-12-10 DIAGNOSIS — E785 Hyperlipidemia, unspecified: Secondary | ICD-10-CM | POA: Diagnosis present

## 2019-12-10 DIAGNOSIS — E119 Type 2 diabetes mellitus without complications: Secondary | ICD-10-CM | POA: Diagnosis present

## 2019-12-10 DIAGNOSIS — M810 Age-related osteoporosis without current pathological fracture: Secondary | ICD-10-CM | POA: Diagnosis present

## 2019-12-10 DIAGNOSIS — E669 Obesity, unspecified: Secondary | ICD-10-CM | POA: Diagnosis present

## 2019-12-10 DIAGNOSIS — N131 Hydronephrosis with ureteral stricture, not elsewhere classified: Secondary | ICD-10-CM | POA: Diagnosis present

## 2019-12-10 DIAGNOSIS — Z9221 Personal history of antineoplastic chemotherapy: Secondary | ICD-10-CM | POA: Diagnosis not present

## 2019-12-10 DIAGNOSIS — Z7984 Long term (current) use of oral hypoglycemic drugs: Secondary | ICD-10-CM | POA: Diagnosis not present

## 2019-12-10 DIAGNOSIS — R1110 Vomiting, unspecified: Secondary | ICD-10-CM

## 2019-12-10 DIAGNOSIS — N179 Acute kidney failure, unspecified: Secondary | ICD-10-CM | POA: Diagnosis present

## 2019-12-10 DIAGNOSIS — Z8051 Family history of malignant neoplasm of kidney: Secondary | ICD-10-CM | POA: Diagnosis not present

## 2019-12-10 DIAGNOSIS — I10 Essential (primary) hypertension: Secondary | ICD-10-CM | POA: Diagnosis present

## 2019-12-10 DIAGNOSIS — N3941 Urge incontinence: Secondary | ICD-10-CM | POA: Diagnosis present

## 2019-12-10 DIAGNOSIS — I2510 Atherosclerotic heart disease of native coronary artery without angina pectoris: Secondary | ICD-10-CM | POA: Diagnosis present

## 2019-12-10 DIAGNOSIS — N401 Enlarged prostate with lower urinary tract symptoms: Secondary | ICD-10-CM | POA: Diagnosis present

## 2019-12-10 DIAGNOSIS — Z923 Personal history of irradiation: Secondary | ICD-10-CM | POA: Diagnosis not present

## 2019-12-10 DIAGNOSIS — R310 Gross hematuria: Secondary | ICD-10-CM | POA: Diagnosis present

## 2019-12-10 DIAGNOSIS — R3916 Straining to void: Secondary | ICD-10-CM | POA: Diagnosis present

## 2019-12-10 DIAGNOSIS — E7800 Pure hypercholesterolemia, unspecified: Secondary | ICD-10-CM | POA: Diagnosis present

## 2019-12-10 DIAGNOSIS — Z6837 Body mass index (BMI) 37.0-37.9, adult: Secondary | ICD-10-CM | POA: Diagnosis not present

## 2019-12-10 DIAGNOSIS — Z20828 Contact with and (suspected) exposure to other viral communicable diseases: Secondary | ICD-10-CM | POA: Diagnosis present

## 2019-12-10 DIAGNOSIS — Z87891 Personal history of nicotine dependence: Secondary | ICD-10-CM

## 2019-12-10 LAB — MRSA PCR SCREENING: MRSA by PCR: NEGATIVE

## 2019-12-10 LAB — GLUCOSE, CAPILLARY
Glucose-Capillary: 149 mg/dL — ABNORMAL HIGH (ref 70–99)
Glucose-Capillary: 214 mg/dL — ABNORMAL HIGH (ref 70–99)
Glucose-Capillary: 242 mg/dL — ABNORMAL HIGH (ref 70–99)
Glucose-Capillary: 245 mg/dL — ABNORMAL HIGH (ref 70–99)
Glucose-Capillary: 265 mg/dL — ABNORMAL HIGH (ref 70–99)

## 2019-12-10 LAB — TYPE AND SCREEN
ABO/RH(D): O POS
Antibody Screen: NEGATIVE

## 2019-12-10 SURGERY — ROBOT ASSISTED LAPAROSCOPIC RADICAL CYSTOPROSTATECTOMY BILATERAL PELVIC LYMPHADENECTOMY,ORTHOTOPIC NEOBLADDER
Anesthesia: General

## 2019-12-10 MED ORDER — KETAMINE HCL 10 MG/ML IJ SOLN
INTRAMUSCULAR | Status: DC | PRN
  Administered 2019-12-10: 30 mg via INTRAVENOUS
  Administered 2019-12-10: 20 mg via INTRAVENOUS

## 2019-12-10 MED ORDER — ROCURONIUM BROMIDE 10 MG/ML (PF) SYRINGE
PREFILLED_SYRINGE | INTRAVENOUS | Status: DC | PRN
  Administered 2019-12-10 (×7): 20 mg via INTRAVENOUS
  Administered 2019-12-10: 100 mg via INTRAVENOUS
  Administered 2019-12-10: 20 mg via INTRAVENOUS

## 2019-12-10 MED ORDER — BUPIVACAINE HCL (PF) 0.25 % IJ SOLN
INTRAMUSCULAR | Status: DC | PRN
  Administered 2019-12-10: 10 mL

## 2019-12-10 MED ORDER — PROPOFOL 10 MG/ML IV BOLUS
INTRAVENOUS | Status: AC
  Filled 2019-12-10: qty 20

## 2019-12-10 MED ORDER — ACETAMINOPHEN 500 MG PO TABS
1000.0000 mg | ORAL_TABLET | Freq: Once | ORAL | Status: AC
  Administered 2019-12-10: 10:00:00 1000 mg via ORAL
  Filled 2019-12-10: qty 2

## 2019-12-10 MED ORDER — PHENYLEPHRINE 40 MCG/ML (10ML) SYRINGE FOR IV PUSH (FOR BLOOD PRESSURE SUPPORT)
PREFILLED_SYRINGE | INTRAVENOUS | Status: DC | PRN
  Administered 2019-12-10 (×2): 120 ug via INTRAVENOUS
  Administered 2019-12-10 (×2): 80 ug via INTRAVENOUS

## 2019-12-10 MED ORDER — KETAMINE HCL 10 MG/ML IJ SOLN
INTRAMUSCULAR | Status: AC
  Filled 2019-12-10: qty 1

## 2019-12-10 MED ORDER — ROCURONIUM BROMIDE 10 MG/ML (PF) SYRINGE
PREFILLED_SYRINGE | INTRAVENOUS | Status: AC
  Filled 2019-12-10: qty 10

## 2019-12-10 MED ORDER — DEXTROSE 5 % IV SOLN
2.0000 g | Freq: Once | INTRAVENOUS | Status: AC
  Administered 2019-12-10 (×3): 2 g via INTRAVENOUS
  Filled 2019-12-10 (×2): qty 2

## 2019-12-10 MED ORDER — SODIUM CHLORIDE 0.9 % IV SOLN
2.0000 g | Freq: Once | INTRAVENOUS | Status: AC
  Administered 2019-12-10: 2 g via INTRAVENOUS
  Filled 2019-12-10: qty 2

## 2019-12-10 MED ORDER — FENTANYL CITRATE (PF) 100 MCG/2ML IJ SOLN
INTRAMUSCULAR | Status: AC
  Filled 2019-12-10: qty 2

## 2019-12-10 MED ORDER — BUPIVACAINE LIPOSOME 1.3 % IJ SUSP
20.0000 mL | Freq: Once | INTRAMUSCULAR | Status: AC
  Administered 2019-12-10: 19:00:00 40 mL
  Filled 2019-12-10: qty 20

## 2019-12-10 MED ORDER — MAGNESIUM CITRATE PO SOLN
1.0000 | Freq: Once | ORAL | Status: DC
  Filled 2019-12-10: qty 296

## 2019-12-10 MED ORDER — SUGAMMADEX SODIUM 500 MG/5ML IV SOLN
INTRAVENOUS | Status: DC | PRN
  Administered 2019-12-10: 300 mg via INTRAVENOUS

## 2019-12-10 MED ORDER — LACTATED RINGERS IV SOLN
INTRAVENOUS | Status: DC | PRN
  Administered 2019-12-10: 15:00:00 1000 mL

## 2019-12-10 MED ORDER — ALVIMOPAN 12 MG PO CAPS
12.0000 mg | ORAL_CAPSULE | ORAL | Status: AC
  Administered 2019-12-10: 12 mg via ORAL
  Filled 2019-12-10: qty 1

## 2019-12-10 MED ORDER — ENOXAPARIN SODIUM 40 MG/0.4ML ~~LOC~~ SOLN
40.0000 mg | SUBCUTANEOUS | Status: DC
  Administered 2019-12-11: 40 mg via SUBCUTANEOUS
  Filled 2019-12-10: qty 0.4

## 2019-12-10 MED ORDER — FENTANYL CITRATE (PF) 250 MCG/5ML IJ SOLN
INTRAMUSCULAR | Status: DC | PRN
  Administered 2019-12-10: 25 ug via INTRAVENOUS
  Administered 2019-12-10 (×3): 50 ug via INTRAVENOUS
  Administered 2019-12-10 (×2): 25 ug via INTRAVENOUS
  Administered 2019-12-10: 100 ug via INTRAVENOUS
  Administered 2019-12-10: 50 ug via INTRAVENOUS

## 2019-12-10 MED ORDER — ONDANSETRON HCL 4 MG/2ML IJ SOLN
INTRAMUSCULAR | Status: DC | PRN
  Administered 2019-12-10: 4 mg via INTRAVENOUS

## 2019-12-10 MED ORDER — DEXAMETHASONE SODIUM PHOSPHATE 10 MG/ML IJ SOLN
INTRAMUSCULAR | Status: DC | PRN
  Administered 2019-12-10: 8 mg via INTRAVENOUS

## 2019-12-10 MED ORDER — PROPOFOL 10 MG/ML IV BOLUS
INTRAVENOUS | Status: DC | PRN
  Administered 2019-12-10: 120 mg via INTRAVENOUS

## 2019-12-10 MED ORDER — DIPHENHYDRAMINE HCL 50 MG/ML IJ SOLN: 12.5000 mg | Freq: Four times a day (QID) | INTRAMUSCULAR | Status: DC | PRN

## 2019-12-10 MED ORDER — ACETAMINOPHEN 10 MG/ML IV SOLN
INTRAVENOUS | Status: DC | PRN
  Administered 2019-12-10: 1000 mg via INTRAVENOUS

## 2019-12-10 MED ORDER — PHENYLEPHRINE 40 MCG/ML (10ML) SYRINGE FOR IV PUSH (FOR BLOOD PRESSURE SUPPORT)
PREFILLED_SYRINGE | INTRAVENOUS | Status: AC
  Filled 2019-12-10: qty 10

## 2019-12-10 MED ORDER — SODIUM CHLORIDE 0.9 % IV SOLN
2.0000 g | Freq: Once | INTRAVENOUS | Status: DC
  Filled 2019-12-10: qty 2

## 2019-12-10 MED ORDER — PHENYLEPHRINE HCL (PRESSORS) 10 MG/ML IV SOLN
INTRAVENOUS | Status: AC
  Filled 2019-12-10: qty 1

## 2019-12-10 MED ORDER — LACTATED RINGERS IV SOLN
INTRAVENOUS | Status: DC
  Administered 2019-12-10 (×2): via INTRAVENOUS

## 2019-12-10 MED ORDER — CHLORHEXIDINE GLUCONATE CLOTH 2 % EX PADS
6.0000 | MEDICATED_PAD | Freq: Every day | CUTANEOUS | Status: DC
  Administered 2019-12-10 – 2019-12-15 (×6): 6 via TOPICAL

## 2019-12-10 MED ORDER — HYDROMORPHONE HCL 2 MG/ML IJ SOLN
INTRAMUSCULAR | Status: AC
  Filled 2019-12-10: qty 1

## 2019-12-10 MED ORDER — POTASSIUM CHLORIDE IN NACL 20-0.9 MEQ/L-% IV SOLN
INTRAVENOUS | Status: DC
  Administered 2019-12-10: 21:00:00 via INTRAVENOUS
  Filled 2019-12-10 (×2): qty 1000

## 2019-12-10 MED ORDER — KETOROLAC TROMETHAMINE 15 MG/ML IJ SOLN
15.0000 mg | Freq: Four times a day (QID) | INTRAMUSCULAR | Status: DC
  Administered 2019-12-10: 23:00:00 15 mg via INTRAVENOUS
  Filled 2019-12-10: qty 1

## 2019-12-10 MED ORDER — 0.9 % SODIUM CHLORIDE (POUR BTL) OPTIME
TOPICAL | Status: DC | PRN
  Administered 2019-12-10: 1000 mL

## 2019-12-10 MED ORDER — FENTANYL CITRATE (PF) 250 MCG/5ML IJ SOLN
INTRAMUSCULAR | Status: AC
  Filled 2019-12-10: qty 5

## 2019-12-10 MED ORDER — SUGAMMADEX SODIUM 500 MG/5ML IV SOLN
INTRAVENOUS | Status: AC
  Filled 2019-12-10: qty 5

## 2019-12-10 MED ORDER — ALBUMIN HUMAN 5 % IV SOLN
INTRAVENOUS | Status: AC
  Filled 2019-12-10: qty 250

## 2019-12-10 MED ORDER — LACTATED RINGERS IV SOLN
INTRAVENOUS | Status: DC
  Administered 2019-12-10 (×3): via INTRAVENOUS

## 2019-12-10 MED ORDER — SODIUM CHLORIDE 0.9 % IV SOLN
1.0000 g | Freq: Three times a day (TID) | INTRAVENOUS | Status: AC
  Administered 2019-12-11 (×2): 1 g via INTRAVENOUS
  Filled 2019-12-10 (×2): qty 1

## 2019-12-10 MED ORDER — ALBUMIN HUMAN 5 % IV SOLN
INTRAVENOUS | Status: DC | PRN
  Administered 2019-12-10: 18:00:00 via INTRAVENOUS

## 2019-12-10 MED ORDER — BUPIVACAINE HCL (PF) 0.25 % IJ SOLN
INTRAMUSCULAR | Status: AC
  Filled 2019-12-10: qty 30

## 2019-12-10 MED ORDER — DIPHENHYDRAMINE HCL 12.5 MG/5ML PO ELIX: 12.5000 mg | ORAL_SOLUTION | Freq: Four times a day (QID) | ORAL | Status: DC | PRN

## 2019-12-10 MED ORDER — LIDOCAINE 2% (20 MG/ML) 5 ML SYRINGE
INTRAMUSCULAR | Status: AC
  Filled 2019-12-10: qty 5

## 2019-12-10 MED ORDER — HEPARIN SODIUM (PORCINE) 5000 UNIT/ML IJ SOLN
INTRAMUSCULAR | Status: AC
  Filled 2019-12-10: qty 1

## 2019-12-10 MED ORDER — FENTANYL CITRATE (PF) 100 MCG/2ML IJ SOLN: 25.0000 ug | INTRAMUSCULAR | Status: DC | PRN

## 2019-12-10 MED ORDER — HYDROMORPHONE HCL 1 MG/ML IJ SOLN
INTRAMUSCULAR | Status: DC | PRN
  Administered 2019-12-10 (×2): .4 mg via INTRAVENOUS

## 2019-12-10 MED ORDER — LIDOCAINE 2% (20 MG/ML) 5 ML SYRINGE
INTRAMUSCULAR | Status: DC | PRN
  Administered 2019-12-10: 100 mg via INTRAVENOUS

## 2019-12-10 MED ORDER — FLEET ENEMA 7-19 GM/118ML RE ENEM
1.0000 | ENEMA | Freq: Once | RECTAL | Status: DC
  Filled 2019-12-10: qty 1

## 2019-12-10 MED ORDER — ONDANSETRON HCL 4 MG/2ML IJ SOLN
4.0000 mg | INTRAMUSCULAR | Status: DC | PRN
  Administered 2019-12-12 – 2019-12-13 (×3): 4 mg via INTRAVENOUS
  Filled 2019-12-10 (×3): qty 2

## 2019-12-10 MED ORDER — HYDROMORPHONE HCL 1 MG/ML IJ SOLN
0.5000 mg | INTRAMUSCULAR | Status: DC | PRN
  Administered 2019-12-11 – 2019-12-13 (×20): 1 mg via INTRAVENOUS
  Administered 2019-12-13: 21:00:00 0.5 mg via INTRAVENOUS
  Administered 2019-12-14 – 2019-12-15 (×8): 1 mg via INTRAVENOUS
  Filled 2019-12-10 (×29): qty 1

## 2019-12-10 MED ORDER — SODIUM CHLORIDE (PF) 0.9 % IJ SOLN
INTRAMUSCULAR | Status: AC
  Filled 2019-12-10: qty 20

## 2019-12-10 MED ORDER — DEXAMETHASONE SODIUM PHOSPHATE 10 MG/ML IJ SOLN
INTRAMUSCULAR | Status: AC
  Filled 2019-12-10: qty 1

## 2019-12-10 MED ORDER — LOSARTAN POTASSIUM 50 MG PO TABS: 100.0000 mg | ORAL_TABLET | Freq: Every day | ORAL | Status: DC

## 2019-12-10 MED ORDER — ALVIMOPAN 12 MG PO CAPS
12.0000 mg | ORAL_CAPSULE | Freq: Two times a day (BID) | ORAL | Status: DC
  Administered 2019-12-11 – 2019-12-15 (×9): 12 mg via ORAL
  Filled 2019-12-10 (×10): qty 1

## 2019-12-10 MED ORDER — INSULIN ASPART 100 UNIT/ML ~~LOC~~ SOLN
0.0000 [IU] | SUBCUTANEOUS | Status: DC
  Administered 2019-12-10: 21:00:00 11 [IU] via SUBCUTANEOUS
  Administered 2019-12-11: 3 [IU] via SUBCUTANEOUS
  Administered 2019-12-11: 05:00:00 4 [IU] via SUBCUTANEOUS
  Administered 2019-12-11: 7 [IU] via SUBCUTANEOUS
  Administered 2019-12-11 (×4): 3 [IU] via SUBCUTANEOUS
  Administered 2019-12-12 (×3): 4 [IU] via SUBCUTANEOUS
  Administered 2019-12-12: 3 [IU] via SUBCUTANEOUS
  Administered 2019-12-12: 14:00:00 4 [IU] via SUBCUTANEOUS
  Administered 2019-12-13 (×2): 3 [IU] via SUBCUTANEOUS
  Administered 2019-12-13: 7 [IU] via SUBCUTANEOUS
  Administered 2019-12-13: 09:00:00 4 [IU] via SUBCUTANEOUS
  Administered 2019-12-13 – 2019-12-14 (×2): 3 [IU] via SUBCUTANEOUS
  Administered 2019-12-14: 4 [IU] via SUBCUTANEOUS
  Administered 2019-12-14 – 2019-12-18 (×15): 3 [IU] via SUBCUTANEOUS

## 2019-12-10 MED ORDER — ORAL CARE MOUTH RINSE
15.0000 mL | Freq: Two times a day (BID) | OROMUCOSAL | Status: DC
  Administered 2019-12-11 – 2019-12-18 (×15): 15 mL via OROMUCOSAL

## 2019-12-10 MED ORDER — HEPARIN SODIUM (PORCINE) 5000 UNIT/ML IJ SOLN
INTRAMUSCULAR | Status: DC | PRN
  Administered 2019-12-10: 5000 [IU] via SUBCUTANEOUS

## 2019-12-10 MED ORDER — HEPARIN SODIUM (PORCINE) 1000 UNIT/ML IJ SOLN
INTRAMUSCULAR | Status: AC
  Filled 2019-12-10: qty 1

## 2019-12-10 MED ORDER — ACETAMINOPHEN 10 MG/ML IV SOLN
INTRAVENOUS | Status: AC
  Filled 2019-12-10: qty 100

## 2019-12-10 MED ORDER — PHENYLEPHRINE HCL-NACL 10-0.9 MG/250ML-% IV SOLN
INTRAVENOUS | Status: DC | PRN
  Administered 2019-12-10: 25 ug/min via INTRAVENOUS

## 2019-12-10 MED ORDER — ONDANSETRON HCL 4 MG/2ML IJ SOLN
INTRAMUSCULAR | Status: AC
  Filled 2019-12-10: qty 2

## 2019-12-10 SURGICAL SUPPLY — 96 items
ADH SKN CLS APL DERMABOND .7 (GAUZE/BANDAGES/DRESSINGS) ×1
AGENT HMST KT MTR STRL THRMB (HEMOSTASIS)
APL ESCP 34 STRL LF DISP (HEMOSTASIS)
APL PRP STRL LF DISP 70% ISPRP (MISCELLANEOUS) ×2
APL SWBSTK 6 STRL LF DISP (MISCELLANEOUS) ×1
APPLICATOR COTTON TIP 6 STRL (MISCELLANEOUS) IMPLANT
APPLICATOR COTTON TIP 6IN STRL (MISCELLANEOUS) ×2
APPLICATOR SURGIFLO ENDO (HEMOSTASIS) IMPLANT
BAG LAPAROSCOPIC 12 15 PORT 16 (BASKET) ×1 IMPLANT
BAG RETRIEVAL 12/15 (BASKET) ×2
BAG SPEC RTRVL LRG 6X4 10 (ENDOMECHANICALS) ×1
BLADE SURG SZ10 CARB STEEL (BLADE) ×1 IMPLANT
CATH FOLEY 2WAY SLVR 30CC 20FR (CATHETERS) ×4 IMPLANT
CATH HEMA 3WAY 30CC 22FR COUDE (CATHETERS) ×1 IMPLANT
CATH HEMA 3WAY 30CC 24FR COUDE (CATHETERS) ×1 IMPLANT
CATH ROBINSON RED A/P 16FR (CATHETERS) ×2 IMPLANT
CHLORAPREP W/TINT 26 (MISCELLANEOUS) ×3 IMPLANT
CLIP VESOLOCK LG 6/CT PURPLE (CLIP) ×4 IMPLANT
CLIP VESOLOCK MED LG 6/CT (CLIP) ×3 IMPLANT
CLIP VESOLOCK XL 6/CT (CLIP) ×3 IMPLANT
COVER SURGICAL LIGHT HANDLE (MISCELLANEOUS) ×2 IMPLANT
COVER TIP SHEARS 8 DVNC (MISCELLANEOUS) ×1 IMPLANT
COVER TIP SHEARS 8MM DA VINCI (MISCELLANEOUS) ×1
COVER WAND RF STERILE (DRAPES) IMPLANT
CUTTER ECHEON FLEX ENDO 45 340 (ENDOMECHANICALS) ×2 IMPLANT
DECANTER SPIKE VIAL GLASS SM (MISCELLANEOUS) ×1 IMPLANT
DERMABOND ADVANCED (GAUZE/BANDAGES/DRESSINGS) ×1
DERMABOND ADVANCED .7 DNX12 (GAUZE/BANDAGES/DRESSINGS) ×2 IMPLANT
DRAIN CHANNEL RND F F (WOUND CARE) ×1 IMPLANT
DRAPE ARM DVNC X/XI (DISPOSABLE) ×4 IMPLANT
DRAPE COLUMN DVNC XI (DISPOSABLE) ×1 IMPLANT
DRAPE DA VINCI XI ARM (DISPOSABLE) ×4
DRAPE DA VINCI XI COLUMN (DISPOSABLE) ×1
DRAPE SURG IRRIG POUCH 19X23 (DRAPES) ×1 IMPLANT
DRAPE WARM FLUID 44X44 (DRAPES) ×1 IMPLANT
ELECT REM PT RETURN 15FT ADLT (MISCELLANEOUS) ×2 IMPLANT
GAUZE 4X4 16PLY RFD (DISPOSABLE) ×1 IMPLANT
GLOVE BIO SURGEON STRL SZ 6.5 (GLOVE) ×4 IMPLANT
GLOVE BIOGEL M STRL SZ7.5 (GLOVE) ×5 IMPLANT
GOWN STRL REUS W/TWL LRG LVL3 (GOWN DISPOSABLE) ×11 IMPLANT
IRRIG SUCT STRYKERFLOW 2 WTIP (MISCELLANEOUS) ×2
IRRIGATION SUCT STRKRFLW 2 WTP (MISCELLANEOUS) ×1 IMPLANT
KIT TURNOVER KIT A (KITS) ×1 IMPLANT
LIGASURE VESSEL 5MM BLUNT TIP (ELECTROSURGICAL) IMPLANT
LOOP VESSEL MAXI BLUE (MISCELLANEOUS) ×2 IMPLANT
NS IRRIG 1000ML POUR BTL (IV SOLUTION) ×1 IMPLANT
PACK ROBOT UROLOGY CUSTOM (CUSTOM PROCEDURE TRAY) ×2 IMPLANT
PAD POSITIONING PINK XL (MISCELLANEOUS) ×1 IMPLANT
PENCIL SMOKE EVACUATOR (MISCELLANEOUS) ×1 IMPLANT
POUCH SPECIMEN RETRIEVAL 10MM (ENDOMECHANICALS) ×2 IMPLANT
RELOAD PROXIMATE 75MM BLUE (ENDOMECHANICALS) ×8 IMPLANT
RELOAD PROXIMATE TA60MM BLUE (ENDOMECHANICALS) IMPLANT
RELOAD STAPLE 45 4.1 GRN THCK (STAPLE) ×1 IMPLANT
RELOAD STAPLE 60 BLU REG PROX (ENDOMECHANICALS) IMPLANT
RELOAD STAPLE 75 3.8 BLU REG (ENDOMECHANICALS) IMPLANT
SCISSORS LAP 5X45 EPIX DISP (ENDOMECHANICALS) ×1 IMPLANT
SEAL CANN UNIV 5-8 DVNC XI (MISCELLANEOUS) ×4 IMPLANT
SEAL XI 5MM-8MM UNIVERSAL (MISCELLANEOUS) ×4
SEALER TISSUE G2 STRG ARTC 35C (ENDOMECHANICALS) ×1 IMPLANT
SET TUBE SMOKE EVAC HIGH FLOW (TUBING) ×2 IMPLANT
SOLUTION ELECTROLUBE (MISCELLANEOUS) ×2 IMPLANT
SPONGE LAP 18X18 RF (DISPOSABLE) ×5 IMPLANT
SPONGE LAP 4X18 RFD (DISPOSABLE) ×2 IMPLANT
STAPLE RELOAD 45 GRN (STAPLE) ×1 IMPLANT
STAPLE RELOAD 45MM GREEN (STAPLE) ×2
STAPLER GUN LINEAR PROX 60 (STAPLE) IMPLANT
STAPLER PROXIMATE 75MM BLUE (STAPLE) ×1 IMPLANT
STENT SET URETHERAL LEFT 7FR (STENTS) ×2 IMPLANT
STENT SET URETHERAL RIGHT 7FR (STENTS) ×2 IMPLANT
SURGIFLO W/THROMBIN 8M KIT (HEMOSTASIS) IMPLANT
SUT CHROMIC 3 0 SH 27 (SUTURE) ×2 IMPLANT
SUT ETHILON 3 0 PS 1 (SUTURE) ×2 IMPLANT
SUT MNCRL AB 4-0 PS2 18 (SUTURE) ×4 IMPLANT
SUT MONO 2 0 UR5 27IN (SUTURE) ×4 IMPLANT
SUT PDS AB 1 CTX 36 (SUTURE) ×3 IMPLANT
SUT PDS AB 1 TP1 96 (SUTURE) ×4 IMPLANT
SUT SILK 2 0 SH (SUTURE) ×2 IMPLANT
SUT SILK 3 0 SH CR/8 (SUTURE) ×4 IMPLANT
SUT VIC AB 0 CT1 27 (SUTURE) ×2
SUT VIC AB 0 CT1 27XBRD ANTBC (SUTURE) ×1 IMPLANT
SUT VIC AB 2-0 SH 27 (SUTURE)
SUT VIC AB 2-0 SH 27XBRD (SUTURE) ×1 IMPLANT
SUT VIC AB 3-0 SH 27 (SUTURE) ×14
SUT VIC AB 3-0 SH 27X BRD (SUTURE) ×6 IMPLANT
SUT VIC AB 3-0 SH 27XBRD (SUTURE) ×5 IMPLANT
SUT VIC AB 4-0 RB1 18 (SUTURE) IMPLANT
SUT VIC AB 4-0 RB1 27 (SUTURE) ×24
SUT VIC AB 4-0 RB1 27XBRD (SUTURE) ×6 IMPLANT
SUT VIC AB 4-0 SH 18 (SUTURE) ×1 IMPLANT
SUT VICRYL 0 UR6 27IN ABS (SUTURE) ×2 IMPLANT
SYSTEM UROSTOMY GENTLE TOUCH (WOUND CARE) ×2 IMPLANT
TOWEL OR NON WOVEN STRL DISP B (DISPOSABLE) ×2 IMPLANT
TROCAR XCEL NON-BLD 5MMX100MML (ENDOMECHANICALS) IMPLANT
TUBE RECTAL 26F 20 RED (TUBING) ×1 IMPLANT
WATER STERILE IRR 1000ML POUR (IV SOLUTION) ×3 IMPLANT
YANKAUER SUCT BULB TIP 10FT TU (MISCELLANEOUS) ×1 IMPLANT

## 2019-12-10 NOTE — Anesthesia Procedure Notes (Signed)
Procedure Name: Intubation Performed by: Glory Buff, CRNA Pre-anesthesia Checklist: Patient identified, Emergency Drugs available, Suction available and Patient being monitored Patient Re-evaluated:Patient Re-evaluated prior to induction Oxygen Delivery Method: Circle system utilized Preoxygenation: Pre-oxygenation with 100% oxygen Induction Type: IV induction Ventilation: Mask ventilation without difficulty Laryngoscope Size: Miller and 3 Grade View: Grade I Tube type: Oral Tube size: 7.5 mm Number of attempts: 1 Airway Equipment and Method: Stylet and Oral airway Placement Confirmation: ETT inserted through vocal cords under direct vision,  positive ETCO2 and breath sounds checked- equal and bilateral Secured at: 22 cm Tube secured with: Tape Dental Injury: Teeth and Oropharynx as per pre-operative assessment

## 2019-12-10 NOTE — Discharge Instructions (Signed)

## 2019-12-10 NOTE — Op Note (Addendum)
Preoperative diagnosis: Urothelial carcinoma of the bladder (clinical stage T3a N0 M0)  Postoperative diagnosis: Urothelial carcinoma of the bladder (clinical stage T3a N0 M0)  Procedure:  1. Robotic assisted laparoscopic radical cystoprostatectomy 2. Bilateral extended robotic assisted laparoscopic pelvic lymphadenectomy 3. Ileal conduit urinary diversion  Surgeon: Pryor Curia. M.D.  Assistant: Debbrah Alar, PA-C  An assistant was required for this surgical procedure.  The duties of the assistant included but were not limited to suctioning, passing suture, camera manipulation, retraction. This procedure would not be able to be performed without an Environmental consultant.   Resident: Dr. Sharlot Gowda  Anesthesia: General  Complications: None  EBL: 400 mL  IVF:  3400 mL crystalloid, 250 mL colloid  Intraoperative findings: 1. Left ureteral margin: Negative 2. Right ureteral margin: Negative  Specimens: 1. Bladder, prostate, and seminal vesicles 2. Right common iliac lymph nodes 3. Right external iliac lymph nodes 4. Right obturator and hypogastric lymph nodes 5. Left common iliac lymph nodes 6. Left external iliac lymph nodes 7. Left obturator and hypogastric lymph nodes  Disposition of specimens: Pathology  Drains:  1. Pelvic drain (via urethra) 2. Left abdominal drain  Indication: Jimmy [name] is a 73 y.o. patient with urothelial carcinoma of the bladder.  He completed neoadjuvant chemotherapy for locally advanced disease. After a thorough review of the management options for treatment of bladder cancer, he elected to proceed with surgical treatment and the above procedure.  We have discussed the potential benefits and risks of the procedure, side effects of the proposed treatment, the likelihood of the patient achieving the goals of the procedure, and any potential problems that might occur during the procedure or recuperation. Informed consent has been  obtained.  Description of procedure:  The patient was taken to the operating room and a general anesthetic was administered. He was given preoperative antibiotics, placed in the dorsal lithotomy position, and prepped and draped in the usual sterile fashion. Next a preoperative timeout was performed. A urethral catheter was placed into the bladder and a site was selected near the umbilicus for placement of the camera port. This was placed using a standard open Hassan technique which allowed entry into the peritoneal cavity under direct vision and without difficulty. An 8 mm port was placed and a pneumoperitoneum established. The camera was then used to inspect the abdomen and there was no evidence of any intra-abdominal injuries or other abnormalities. The remaining abdominal ports were then placed. 8 mm robotic ports were placed in the right lower quadrant, left lower quadrant, and far left lateral abdominal wall. A 5 mm port was placed in the right upper quadrant and a 12 mm port was placed in the right lateral abdominal wall for laparoscopic assistance. All ports were placed under direct vision without difficulty. The surgical cart was then docked.   Utilizing the cautery scissors, the sigmoid colon was reflected medially after incising the peritoneum.  There were some bowel adhesions between the bladder and colon that were also divided. The left ureter was identified at the point where it crossed the iliac vessels and was dissected free from the surrounding retroperitoneal fat. It was dilated and did have an indwelling stent.  The ureter was quite adherent to the surrounding tissue and appeared extremely firm and non-mobile.  The dissection of the ureter was then carried down into the pelvis to its insertion into the bladder.  The superior vesical artery was ligated and divided. The ureter was then ligated with a 10 mm  Weck clip distally on the ureter and bladder side.  A ureteral margin was sent for frozen  section analysis.  The ureter was then placed up in the abdomen out of the pelvis. Attention then turned to the right side of the abdomen and the peritoneum was incised allowing the colon and ileum to be mobilized medially.  The right ureter was identified and isolated from the surrounding retroperitoneal fat in a similar fashion to the contralateral side.  The appearance of the right ureter and surrounding tissue was also similar to the left side and was quite adherent to the periureteral tissue. The right ureter was ligated with 10 mm Weck clips and a ureteral margin was sent for frozen section analysis. The right ureter was placed up into the abdomen.  The left ureter was then brought behind the sigmoid colon into the right retroperitoneal space.  The posterior peritoneum was then examined and the bladder was retracted anteriorly.  The posterior peritoneum between the bladder and rectum was opened connecting the peritoneal incisions near each ureteral stump.  The posterior plane between the rectum and bladder/prostate was developed helping to isolate the vascular pedicles of the bladder. The peritoneum was incised lateral to the bladder on either side of the medial umbilical ligaments and the bladder was mobilized on each side down to the endopelvic fascia leaving the bladder suspended by the urachus. The vas deferens were divided and the anterior and posterior vascular pedicles of the bladder were prepared for ligation.  The vascular pedicles of the bladder were then ligated and divided with bipolar energy with the Enseal device.  The urachus was then divided and the bladder was fully mobilized anteriorly. The periprostatic fat was then removed from the prostate allowing full exposure of the endopelvic fascia. The endopelvic fascia was then incised from the apex back to the base of the prostate bilaterally and the underlying levator muscle fibers were swept laterally off the prostate thereby isolating the  dorsal venous complex. Due to the patient's prior radiation therapy to the prostate, the prostate was very adherent to the surrounding levator tissue and was carefully separated from the levator muscles. The dorsal vein was then stapled and divided with a 45 mm Flex Eschelon stapler.  The urethra was incised sharply anteriorly.  The catheter was then clipped with a 10 mm Weck clip and divided distally. The posterior urethra was sharply divided and the bladder and prostate specimen was placed into the 12 mm retrieval bag. A urethral margin was sent for frozen section analysis.  The pelvis was copiously irrigated.  The rectum was checked for injury and was not injured. Hemostasis was ensured.     Attention then turned to the right pelvic sidewall. The fibrofatty tissue extending from the genitofemoral nerve laterally to the aorta proximally to the hypogastric artery posteriorly and Cooper's ligament distally was dissected free from the pelvic sidewall with care to preserve the obturator nerve, genitofemoral nerve, and major vascular structures. Weck clips were used for lymphostasis and hemostasis. An identical procedure was then performed on the contralateral side and the lymphatic packets were removed for permanent pathologic analysis. The lymphatic packets were sent in the following packets: common iliac, external iliac, and obturator/hypogastric.  At this point, the surgical cart was undocked and the 12 mm port site was closed with a 0-vicryl suture placed laparoscopically.  The specimen bags were then brought out through the camera port site.  All ports were removed under vision.  The patient was taken out  of Trendelenburg and the camera port incision was extended laterally around the umbilicus and down the midline.  All specimens were removed.  A self-retaining Bookwalter retractor was placed.  The bowel was packed superiorly and the ureters were identified.  The left ureter was brought under the sigmoid  colon to the right lower pelvis.  The terminal ileum was then identified and an area of the ileum about 15 cm from the ileocecal junction was marked.  The bowel was transilluminated and the avascular line of Johnella Moloney was identified.  The mesentery was cleaned of the bowel here and the mesentery was divided with the Enseal.  A more proximal area was identified about 15 cm proximal and marked and isolated.  This resulted in a very viable conduit with care taken to ensure adequate mesenteric blood supply. The proximal and distal portions of the conduit were then stapled and divided with the 75 mm Endo GIA.  The conduit was then laid caudally and attention turned to the edges of the ileum. An ileo-ileal anastomosis was then performed. The antimesenteric edges of the bowel were opened with Mayo scissors. The bowel was then inserted so that its antimesenteric borders were facing each other and the 75 mm Endo GIA was used to perform the ileoileal anastomosis. The GIA stapler was then used to complete the anastomosis after the edges of the bowel carefully reapproximated with Babcock clamps. Additional 3-0 silk sutures were placed at the edge of the staple lines to avoid excess tension in these areas. The mesenteric window was closed with interrupted 3-0 silk sutures. Attention then turned to creation of the ileal conduit. The conduit was laid across the incision and prepared. The left ureter was brought through a mesenteric window posterior to the sigmoid colon and 4-0 Vicryl sutures were used to attach the ureteral adventitia to the conduit in order to avoid tension on the anastomosis. The right ureter was first opened and spatulated. A small plug from the ileum was then removed with Potts scissors and the bowel mucosa was everted with interrupted 4-0 Vicryl sutures. 3 interrupted 4-0 Vicryl sutures were then placed at the proximal spatulated end of the ureter and sent to their corresponding areas in the opening created on  the ileal conduit. Additional 4-0 Vicrl sutures were then placed and a single J ureteral stent was inserted over a wire into the right renal pelvis. It was then brought out the conduit using a pediatric Fraser tip sucker to threaded through the conduit and out the distal end of the conduit. Additional 4-0 Vicryl sutures were placed in an interrupted fashion to complete the ureteral anastomosis. An identical procedure was then performed for the left ureteroenteric anastomosis. Attention then turned to the stoma. The previously placed enteral stomal mark in the right lower quadrant was identified. A circular plug of skin and underlying subcutaneous fat was removed with electrocautery. The anterior rectus fascia was then opened in a cruciate manner and the underlying rectus muscles were separated alone the posterior rectus fascia to be identified and also opened in a cruciate manner. 2 fingerbreadths could be easily placed through the opening in the fascia and a Babcock clamp was used to bring the distal end of the conduit out of the stoma site. The stoma was then secured and matured with interrupted 3-0 Vicryl sutures placed in the dermis serosa of the bowel and mucosa of the bowel. Additional 3-0 Vicryl sutures were used to sew the mucosa to the skin edges. Attention then returned to  the abdomen. The conduit was secured to the peritoneal surface and affect retroperitonealizing the ureteroenteric anastomoses. Hemostasis was again ensured and the pelvis was again irrigated with sterile water. The bowel anastomosis was examined and appeared without problems. Preparations were then made for closure. A 20 Fr urethral catheter was used to drain the pelvis. Another 40 Blake drain was brought through one of the prior left sided port site incisions in the left lower quadrant and placed near the ureteroenteric anastomoses. These were secured to the skin with nylon sutures.  Preparations were then made for closure of the lower  abdominal incision. Two #1 looped PDS sutures were used to close the rectus fascia.  The superficial wound was irrigated and the skin was reapproximated with staples. Sterile dressing was applied. A urostomy appliance was placed over the stoma.   The patient tolerated the procedure well without complications. He was able to be extubated and transferred to the recovery unit in satisfactory condition

## 2019-12-10 NOTE — Progress Notes (Signed)
Pt has declined hospital provided CPAP stating that he does not use one at home.  RT to monitor and assess as needed.

## 2019-12-10 NOTE — Progress Notes (Signed)
Patient ID: Jimmy [name], male   DOB: 10/27/1946, 73 y.o.   MRN: [id number]  Post-op note  Subjective: The patient is doing well.  No complaints.  Extubated.  Objective: Vital signs in last 24 hours: Temp:  [97.4 F (36.3 C)-98.3 F (36.8 C)] 97.4 F (36.3 C) (12/10 1915) Pulse Rate:  [95-101] 95 (12/10 1915) Resp:  [16-18] 16 (12/10 1915) BP: (152-154)/(81-86) 152/86 (12/10 1915) SpO2:  [98 %-100 %] 100 % (12/10 1915) Weight:  [110.7 kg] 110.7 kg (12/10 1010)  Intake/Output from previous day: No intake/output data recorded. Intake/Output this shift: Total I/O In: 400 [I.V.:400] Out: -   Physical Exam:  General: Alert and oriented. Abdomen: Soft, Nondistended. Adequate urine in urostomy pouch. Incisions: Clean and dry.  Lab Results: Recent Labs    12/08/19 1140  HGB 11.9*  HCT 39.3    Assessment/Plan: POD#0   1) Continue to monitor closely overnight   Jimmy [name]. MD   LOS: 0 days   [name] 12/10/2019, 7:37 PM

## 2019-12-10 NOTE — Interval H&P Note (Signed)
History and Physical Interval Note:  12/10/2019 10:10 AM  Jimmy [name]  has presented today for surgery, with the diagnosis of BLADDER CANCER.  The various methods of treatment have been discussed with the patient and family. After consideration of risks, benefits and other options for treatment, the patient has consented to  Procedure(s): ROBOT ASSISTED LAPAROSCOPIC RADICAL CYSTOPROSTATECTOMY BILATERAL PELVIC LYMPHADENECTOMY, ILEAL CONDUIT URINARY DIVERSION (N/A) as a surgical intervention.  The patient's history has been reviewed, patient examined, no change in status, stable for surgery.  I have reviewed the patient's chart and labs.  Questions were answered to the patient's satisfaction.     [employer]

## 2019-12-10 NOTE — Transfer of Care (Signed)
Immediate Anesthesia Transfer of Care Note  Patient: [name]  Procedure(s) Performed: ROBOT ASSISTED LAPAROSCOPIC RADICAL CYSTOPROSTATECTOMY BILATERAL PELVIC LYMPHADENECTOMY, ILEAL CONDUIT URINARY DIVERSION (N/A )  Patient Location: PACU  Anesthesia Type:General  Level of Consciousness: drowsy and patient cooperative  Airway & Oxygen Therapy: Patient Spontanous Breathing and Patient connected to face mask oxygen  Post-op Assessment: Report given to RN and Post -op Vital signs reviewed and stable  Post vital signs: Reviewed and stable  Last Vitals:  Vitals Value Taken Time  BP 152/86 12/10/19 1915  Temp    Pulse 99 12/10/19 1925  Resp 20 12/10/19 1925  SpO2 100 % 12/10/19 1925  Vitals shown include unvalidated device data.  Last Pain:  Vitals:   12/10/19 0959  TempSrc:   PainSc: 0-No pain         Complications: No apparent anesthesia complications

## 2019-12-11 LAB — BASIC METABOLIC PANEL
Anion gap: 10 (ref 5–15)
Anion gap: 9 (ref 5–15)
BUN: 21 mg/dL (ref 8–23)
BUN: 27 mg/dL — ABNORMAL HIGH (ref 8–23)
CO2: 20 mmol/L — ABNORMAL LOW (ref 22–32)
CO2: 21 mmol/L — ABNORMAL LOW (ref 22–32)
Calcium: 8.6 mg/dL — ABNORMAL LOW (ref 8.9–10.3)
Calcium: 8.3 mg/dL — ABNORMAL LOW (ref 8.9–10.3)
Chloride: 107 mmol/L (ref 98–111)
Chloride: 108 mmol/L (ref 98–111)
Creatinine, Ser: 1.84 mg/dL — ABNORMAL HIGH (ref 0.61–1.24)
Creatinine, Ser: 2.08 mg/dL — ABNORMAL HIGH (ref 0.61–1.24)
GFR calc Af Amer: 41 mL/min — ABNORMAL LOW (ref >60)
GFR calc Af Amer: 36 mL/min — ABNORMAL LOW (ref >60)
GFR calc non Af Amer: 36 mL/min — ABNORMAL LOW (ref >60)
GFR calc non Af Amer: 31 mL/min — ABNORMAL LOW (ref >60)
Glucose, Bld: 184 mg/dL — ABNORMAL HIGH (ref 70–99)
Glucose, Bld: 151 mg/dL — ABNORMAL HIGH (ref 70–99)
Potassium: 6 mmol/L — ABNORMAL HIGH (ref 3.5–5.1)
Potassium: 5.6 mmol/L — ABNORMAL HIGH (ref 3.5–5.1)
Sodium: 137 mmol/L (ref 135–145)
Sodium: 138 mmol/L (ref 135–145)

## 2019-12-11 LAB — POTASSIUM: Potassium: 6.1 mmol/L — ABNORMAL HIGH (ref 3.5–5.1)

## 2019-12-11 LAB — GLUCOSE, CAPILLARY
Glucose-Capillary: 156 mg/dL — ABNORMAL HIGH (ref 70–99)
Glucose-Capillary: 144 mg/dL — ABNORMAL HIGH (ref 70–99)
Glucose-Capillary: 149 mg/dL — ABNORMAL HIGH (ref 70–99)
Glucose-Capillary: 142 mg/dL — ABNORMAL HIGH (ref 70–99)
Glucose-Capillary: 144 mg/dL — ABNORMAL HIGH (ref 70–99)
Glucose-Capillary: 141 mg/dL — ABNORMAL HIGH (ref 70–99)

## 2019-12-11 LAB — HEMOGLOBIN AND HEMATOCRIT, BLOOD
HCT: 34.2 % — ABNORMAL LOW (ref 39.0–52.0)
Hemoglobin: 10.6 g/dL — ABNORMAL LOW (ref 13.0–17.0)

## 2019-12-11 LAB — MAGNESIUM: Magnesium: 1.9 mg/dL (ref 1.7–2.4)

## 2019-12-11 MED ORDER — CHEWING GUM (ORBIT) SUGAR FREE
1.0000 | CHEWING_GUM | Freq: Four times a day (QID) | ORAL | Status: DC
  Administered 2019-12-11 – 2019-12-14 (×12): 1 via ORAL
  Filled 2019-12-11: qty 1

## 2019-12-11 MED ORDER — ACETAMINOPHEN 10 MG/ML IV SOLN
1000.0000 mg | Freq: Four times a day (QID) | INTRAVENOUS | Status: AC
  Administered 2019-12-11 – 2019-12-12 (×4): 1000 mg via INTRAVENOUS
  Filled 2019-12-11 (×4): qty 100

## 2019-12-11 MED ORDER — SODIUM CHLORIDE 0.9 % IV SOLN
INTRAVENOUS | Status: DC
  Administered 2019-12-11 – 2019-12-14 (×9): via INTRAVENOUS

## 2019-12-11 MED ORDER — FUROSEMIDE 10 MG/ML IJ SOLN
20.0000 mg | Freq: Once | INTRAMUSCULAR | Status: AC
  Administered 2019-12-11: 20 mg via INTRAVENOUS
  Filled 2019-12-11: qty 2

## 2019-12-11 MED ORDER — ENOXAPARIN SODIUM 40 MG/0.4ML ~~LOC~~ SOLN: 40.0000 mg | SUBCUTANEOUS | Status: DC

## 2019-12-11 NOTE — Evaluation (Addendum)
Physical Therapy Evaluation Patient Details Name: Jimmy [name] MRN: [id number] DOB: January 01, 1946 Today's Date: 12/11/2019   History of Present Illness  Pt s/p Laparoscopic radical cystoprostatectomy and pelvic lymphadenectomy 2* bladder CA  Clinical Impression  Pt admitted as above and presenting with functional mobility limitations 2* post op pain and obesity.  Pt should progress to dc home with family assist.    Follow Up Recommendations Home health PT;No PT follow up(Dependent on acute stay progress)    Equipment Recommendations  Other (comment)(TBD Dependent on acute stay progress)    Recommendations for Other Services OT consult     Precautions / Restrictions Precautions Precautions: Fall;Other (comment) Precaution Comments: JP drain on L Restrictions Weight Bearing Restrictions: No      Mobility  Bed Mobility Overal bed mobility: Needs Assistance Bed Mobility: Rolling Rolling: Mod assist;Max assist;+2 for physical assistance;+2 for safety/equipment         General bed mobility comments: Completed partial roll and returned to supine 2* pain.  Additional meds requested  Transfers                    Ambulation/Gait                Stairs            Wheelchair Mobility    Modified Rankin (Stroke Patients Only)       Balance                                             Pertinent Vitals/Pain Pain Assessment: 0-10 Pain Score: 9  Pain Location: surgical sites Pain Descriptors / Indicators: Aching;Sore;Grimacing;Guarding Pain Intervention(s): Limited activity within patient's tolerance;Monitored during session;Premedicated before session;Patient requesting pain meds-RN notified    Home Living Family/patient expects to be discharged to:: Private residence Living Arrangements: Spouse/significant other Available Help at Discharge: Family Type of Home: House Home Access: Stairs to enter   Technical brewer of  Steps: 1 Home Layout: One level Home Equipment: [place] - single point      Prior Function Level of Independence: Independent               Hand Dominance        Extremity/Trunk Assessment   Upper Extremity Assessment Upper Extremity Assessment: Overall WFL for tasks assessed    Lower Extremity Assessment Lower Extremity Assessment: Overall WFL for tasks assessed       Communication   Communication: HOH  Cognition Arousal/Alertness: Awake/alert Behavior During Therapy: WFL for tasks assessed/performed Overall Cognitive Status: Within Functional Limits for tasks assessed                                        General Comments      Exercises General Exercises - Lower Extremity Ankle Circles/Pumps: AROM;Both;15 reps;Supine   Assessment/Plan    PT Assessment Patient needs continued PT services  PT Problem List Decreased activity tolerance;Decreased balance;Decreased mobility;Decreased knowledge of use of DME;Obesity;Pain       PT Treatment Interventions DME instruction;Gait training;Stair training;Functional mobility training;Therapeutic activities;Therapeutic exercise;Patient/family education    PT Goals (Current goals can be found in the Care Plan section)  Acute Rehab PT Goals Patient Stated Goal: Less pain PT Goal Formulation: With patient Time For Goal Achievement: 12/25/19 Potential to Achieve  Goals: Fair    Frequency Min 3X/week   Barriers to discharge        Co-evaluation               AM-PAC PT "6 Clicks" Mobility  Outcome Measure Help needed turning from your back to your side while in a flat bed without using bedrails?: A Lot Help needed moving from lying on your back to sitting on the side of a flat bed without using bedrails?: A Lot Help needed moving to and from a bed to a chair (including a wheelchair)?: Total Help needed standing up from a chair using your arms (e.g., wheelchair or bedside chair)?: Total Help  needed to walk in hospital room?: Total Help needed climbing 3-5 steps with a railing? : Total 6 Click Score: 8    End of Session   Activity Tolerance: Patient limited by pain Patient left: in bed;with call bell/phone within reach Nurse Communication: Mobility status;Patient requests pain meds PT Visit Diagnosis: Pain;Difficulty in walking, not elsewhere classified (R26.2) Pain - part of body: (surgical site)    Time: 1000-1013 PT Time Calculation (min) (ACUTE ONLY): 13 min   Charges:   PT Evaluation $PT Eval Low Complexity: 1 Low          [facility] Pager [phone] Office [phone]   Clois Treanor 12/11/2019, 11:52 AM

## 2019-12-11 NOTE — Progress Notes (Signed)
Urology Progress Note   1 Day Post-Op from robotic converted to open radical cystoprostatectomy with bilateral pelvic lymph node dissection and ileal conduit urinary diversion 12/10/19.   Subjective/Interval: Extubated and transferred to stepdown per pathway.  NAEON.  Pain well controlled.  No nausea or emesis.   AHDS Good UOP since OR Urethral catheter drain and JP with SS output.  Cr 1.8 K 6.0, EKG without peaked T waves Hgb 10.6  Objective: Vital signs in last 24 hours: Temp:  [97.4 F (36.3 C)-98.4 F (36.9 C)] 98.4 F (36.9 C) (12/11 0400) Pulse Rate:  [91-101] 92 (12/11 0732) Resp:  [12-26] 12 (12/11 0732) BP: (114-170)/(56-89) 128/64 (12/11 0600) SpO2:  [96 %-100 %] 97 % (12/11 0732) Weight:  [110.7 kg-115.4 kg] 115.4 kg (12/10 2100)  Intake/Output from previous day: 12/10 0701 - 12/11 0700 In: 4000 [I.V.:3400; IV Piggyback:600] Out: 1640 [Urine:1040; Drains:200; Blood:400] Intake/Output this shift: No intake/output data recorded.  Physical Exam:  General: Alert and oriented CV: Regular rate Lungs: No increased work of breathing Abdomen: Obese, Soft, appropriately tender. Incisions c/d/i. JP SS. GU: Ileal conduit stoma with slight congestion, slightly retracted, two stent emanating and draining clear watermelon colored urine. Urethral catheter acting as pelvic drain with SS output.  Ext: NT, No erythema  Lab Results: Recent Labs    12/08/19 1140 12/11/19 0209  HGB 11.9* 10.6*  HCT 39.3 34.2*   Recent Labs    12/08/19 1140 12/11/19 0209  NA 141 137  K 4.7 6.0*  CL 108 107  CO2 22 20*  GLUCOSE 157* 184*  BUN 19 21  CREATININE 1.32* 1.84*  CALCIUM 9.6 8.6*    Studies/Results: No results found.  Assessment/Plan:  73 y.o. male s/p robotic converted to open radical cystoprostatectomy with bilateral pelvic lymph node dissection and ileal conduit urinary diversion 12/10/19.    Overall doing well post-op.    Recheck potassium    Multimodal  pain regimen.  Hold Toradol given increased creatinine postoperatively.  Continue resuscitative fluids with normal saline in the setting of hyperkalemia  Advance diet to sips 8 ounces every 8 hours  Continue bowel regimen  Continue ileal conduit stents as well as gravity drainage  Continue JP drain to bulb suction  Remove urethral catheter/drain  Continue Entereg until bowel movement  Continue Lovenox  Morning labs  Consult physical therapy  Will reassess this afternoon for transfer to floor status   LOS: 1 day

## 2019-12-11 NOTE — Progress Notes (Signed)
Pt. States that he has not worn CPAP device in several years and is not interested in wearing one at this time.

## 2019-12-11 NOTE — Progress Notes (Signed)
Physical Therapy Treatment Patient Details Name: Jimmy [name] MRN: [id number] DOB: [birth year] Today's Date: 12/11/2019    History of Present Illness Pt s/p Laparoscopic radical cystoprostatectomy and pelvic lymphadenectomy 2* bladder CA    PT Comments    Pt able to achieve sitting at EOB with increased time and assist of 2.  Pt sitting EOB x 10 min but unable to bring wt fwd and then c/o fatigue but denying dizziness - BP 91/49 - pt returned to supine max assist of 3.   Follow Up Recommendations  Home health PT;No PT follow up     Equipment Recommendations  Other (comment)    Recommendations for Other Services OT consult     Precautions / Restrictions Precautions Precautions: Fall;Other (comment) Precaution Comments: BP drop with move to sitting: JP drain on L Restrictions Weight Bearing Restrictions: No    Mobility  Bed Mobility Overal bed mobility: Needs Assistance Bed Mobility: Rolling;Sidelying to Sit;Sit to Supine Rolling: Mod assist;+2 for physical assistance;+2 for safety/equipment Sidelying to sit: Mod assist;+2 for physical assistance;+2 for safety/equipment;HOB elevated   Sit to supine: Total assist;+2 for physical assistance;+2 for safety/equipment   General bed mobility comments: Increased time, use of bedrail, frequent rest breaks   Transfers                 General transfer comment: NT 2* BP drop - pt denies dizziness but states  "I just feel weak  Ambulation/Gait                 Stairs             Wheelchair Mobility    Modified Rankin (Stroke Patients Only)       Balance Overall balance assessment: Needs assistance Sitting-balance support: Bilateral upper extremity supported;Feet supported Sitting balance-Leahy Scale: Poor   Postural control: Posterior lean                                  Cognition Arousal/Alertness: Awake/alert Behavior During Therapy: WFL for tasks assessed/performed Overall  Cognitive Status: Within Functional Limits for tasks assessed                                        Exercises General Exercises - Lower Extremity Ankle Circles/Pumps: AROM;Both;15 reps;Supine    General Comments        Pertinent Vitals/Pain Pain Assessment: 0-10 Pain Score: 8  Pain Location: surgical sites Pain Descriptors / Indicators: Aching;Sore;Grimacing;Guarding Pain Intervention(s): Limited activity within patient's tolerance;Monitored during session;Premedicated before session    Home Living Family/patient expects to be discharged to:: Private residence Living Arrangements: Spouse/significant other Available Help at Discharge: Family Type of Home: House Home Access: Stairs to enter   Home Layout: One level Home Equipment: Cane - single point      Prior Function Level of Independence: Independent          PT Goals (current goals can now be found in the care plan section) Acute Rehab PT Goals Patient Stated Goal: Less pain PT Goal Formulation: With patient Time For Goal Achievement: 12/25/19 Potential to Achieve Goals: Fair Progress towards PT goals: Progressing toward goals    Frequency    Min 3X/week      PT Plan Current plan remains appropriate    Co-evaluation  AM-PAC PT "6 Clicks" Mobility   Outcome Measure  Help needed turning from your back to your side while in a flat bed without using bedrails?: A Lot Help needed moving from lying on your back to sitting on the side of a flat bed without using bedrails?: A Lot Help needed moving to and from a bed to a chair (including a wheelchair)?: Total Help needed standing up from a chair using your arms (e.g., wheelchair or bedside chair)?: Total Help needed to walk in hospital room?: Total Help needed climbing 3-5 steps with a railing? : Total 6 Click Score: 8    End of Session   Activity Tolerance: Patient limited by fatigue;Patient limited by pain(BP  drop) Patient left: in bed;with call bell/phone within reach;with family/visitor present Nurse Communication: Mobility status PT Visit Diagnosis: Pain;Difficulty in walking, not elsewhere classified (R26.2) Pain - part of body: (surgical site)     Time: 1040-1110 PT Time Calculation (min) (ACUTE ONLY): 30 min  Charges:  $Therapeutic Activity: 23-37 mins                     [place] Pager [phone] Office [phone]    Osten Janek 12/11/2019, 11:59 AM

## 2019-12-11 NOTE — Progress Notes (Signed)
Report called to [facility] for primary nurse, Myriam Jacobson.

## 2019-12-11 NOTE — Progress Notes (Signed)
Patient ID: Jimmy [name], male   DOB: July 10, 1946, 73 y.o.   MRN: [id number]  1 Day Post-Op Subjective: Pt feeling better.  Pain improved compared to this morning.  Ambulated with PT.  Enterostomal therapy has begun teaching for him and his wife.  Objective: Vital signs in last 24 hours: Temp:  [97.4 F (36.3 C)-98.5 F (36.9 C)] 98.4 F (36.9 C) (12/11 1200) Pulse Rate:  [91-116] 103 (12/11 1257) Resp:  [11-26] 12 (12/11 1257) BP: (91-170)/(47-89) 125/84 (12/11 1221) SpO2:  [83 %-100 %] 92 % (12/11 1257) Weight:  [115.4 kg] 115.4 kg (12/10 2100)  Intake/Output from previous day: 12/10 0701 - 12/11 0700 In: 4000 [I.V.:3400; IV Piggyback:600] Out: 1640 [Urine:1040; Drains:200; Blood:400] Intake/Output this shift: Total I/O In: 198.1 [IV Piggyback:198.1] Out: 75 [Urine:75]  Physical Exam:  General: Alert and oriented Abdomen: Soft, ND, Stoma pink, urine clearing Incisions: C/D/I Ext: NT, No erythema  Lab Results: Recent Labs    12/11/19 0209  HGB 10.6*  HCT 34.2*   BMET Recent Labs    12/11/19 0209 12/11/19 0747 12/11/19 1412  NA 137  --  138  K 6.0* 6.1* 5.6*  CL 107  --  108  CO2 20*  --  21*  GLUCOSE 184*  --  151*  BUN 21  --  27*  CREATININE 1.84*  --  2.08*  CALCIUM 8.6*  --  8.3*     Studies/Results: No results found.  Assessment/Plan: 1) Bladder cancer: Pathology pending. 2) Postoperative care: Continue DVT prophylaxis.  Will change to Felt heparin if renal function does not improve tomorrow., Begin ambulating once on floor.  Will place transfer orders. 3) AKI: Continue IVF with NS.  Would hope this will improve over next 24-48 hrs.  4) Hyperkalemia: Improved after furosemide.  Will recheck in morning.   LOS: 1 day   [name] 12/11/2019, 5:48 PM

## 2019-12-11 NOTE — Consult Note (Addendum)
[place] Nurse ostomy consult note Patient seen in ICU bed 1236 today.    Stoma type/location: RUQ ileal conduit with 2 stents intact; Red = right, Blue = left Stomal assessment/size: Oval, widest width measures 1 and 3/8 inches. Os at center. Slightly raised. Peristomal assessment: Intact with sutures Treatment options for stomal/peristomal skin: skin barrier ring and convex pouching system Output: clear urine, blood tinged  Ostomy pouching: 1pc.convex urostomy pouch with skin barrier ring  Education provided:  No education to patient. Patient is in ICU, receiving IV lasix for diuresis.  Teaching with wife:  Explained role of ostomy nurse and creation of stoma, used new pouch as illustration and demonstrated pouch emptying.  Taught that pouch changes are twice weekly, emptying is several times a day. Taught that she may need to assist patient with "centering" of pouch over stoma due to placement on abdomen. Taught that patient would probably benefit from using a bedside drainage collector at night. Taught that patient could still lie on right side.  Answered wife's questions.  She has used [facility] in the past for diabetic supplies and is familiar with ordering supplies 3 months at a time.  Three pouches at bedside, 3 skin barrier rings, a sizing guide, pattern and medical adhesive remover spray. Educational folder provided to bedside. Three booklets (one also for wife and daughter) and 2 one-page teaching sheets for a one-piece pouch application.  Enrolled patient in [place] program: Yes, today.  [facility] nursing team will follow, and will remain available to this patient, the nursing and medical teams.  . Thanks, Maudie Flakes, MSN, RN, [place], Arther Abbott  Pager# [phone]

## 2019-12-12 LAB — BASIC METABOLIC PANEL
Anion gap: 12 (ref 5–15)
Anion gap: 10 (ref 5–15)
BUN: 34 mg/dL — ABNORMAL HIGH (ref 8–23)
BUN: 39 mg/dL — ABNORMAL HIGH (ref 8–23)
CO2: 18 mmol/L — ABNORMAL LOW (ref 22–32)
CO2: 21 mmol/L — ABNORMAL LOW (ref 22–32)
Calcium: 8.2 mg/dL — ABNORMAL LOW (ref 8.9–10.3)
Calcium: 8.4 mg/dL — ABNORMAL LOW (ref 8.9–10.3)
Chloride: 105 mmol/L (ref 98–111)
Chloride: 106 mmol/L (ref 98–111)
Creatinine, Ser: 2.65 mg/dL — ABNORMAL HIGH (ref 0.61–1.24)
Creatinine, Ser: 2.96 mg/dL — ABNORMAL HIGH (ref 0.61–1.24)
GFR calc Af Amer: 27 mL/min — ABNORMAL LOW (ref >60)
GFR calc Af Amer: 23 mL/min — ABNORMAL LOW (ref >60)
GFR calc non Af Amer: 23 mL/min — ABNORMAL LOW (ref >60)
GFR calc non Af Amer: 20 mL/min — ABNORMAL LOW (ref >60)
Glucose, Bld: 167 mg/dL — ABNORMAL HIGH (ref 70–99)
Glucose, Bld: 177 mg/dL — ABNORMAL HIGH (ref 70–99)
Potassium: 6.3 mmol/L (ref 3.5–5.1)
Potassium: 5.7 mmol/L — ABNORMAL HIGH (ref 3.5–5.1)
Sodium: 135 mmol/L (ref 135–145)
Sodium: 137 mmol/L (ref 135–145)

## 2019-12-12 LAB — HEMOGLOBIN AND HEMATOCRIT, BLOOD
HCT: 31.7 % — ABNORMAL LOW (ref 39.0–52.0)
Hemoglobin: 9.2 g/dL — ABNORMAL LOW (ref 13.0–17.0)

## 2019-12-12 LAB — GLUCOSE, CAPILLARY
Glucose-Capillary: 141 mg/dL — ABNORMAL HIGH (ref 70–99)
Glucose-Capillary: 153 mg/dL — ABNORMAL HIGH (ref 70–99)
Glucose-Capillary: 153 mg/dL — ABNORMAL HIGH (ref 70–99)
Glucose-Capillary: 152 mg/dL — ABNORMAL HIGH (ref 70–99)
Glucose-Capillary: 154 mg/dL — ABNORMAL HIGH (ref 70–99)

## 2019-12-12 MED ORDER — FUROSEMIDE 10 MG/ML IJ SOLN
20.0000 mg | Freq: Once | INTRAMUSCULAR | Status: AC
  Administered 2019-12-12: 16:00:00 20 mg via INTRAVENOUS
  Filled 2019-12-12: qty 2

## 2019-12-12 MED ORDER — FUROSEMIDE 10 MG/ML IJ SOLN
20.0000 mg | Freq: Once | INTRAMUSCULAR | Status: AC
  Administered 2019-12-12: 20 mg via INTRAVENOUS
  Filled 2019-12-12: qty 2

## 2019-12-12 MED ORDER — BISACODYL 10 MG RE SUPP
10.0000 mg | Freq: Once | RECTAL | Status: AC
  Administered 2019-12-12: 10 mg via RECTAL
  Filled 2019-12-12: qty 1

## 2019-12-12 MED ORDER — METOCLOPRAMIDE HCL 5 MG/ML IJ SOLN
5.0000 mg | Freq: Four times a day (QID) | INTRAMUSCULAR | Status: DC
  Administered 2019-12-12 – 2019-12-16 (×18): 5 mg via INTRAVENOUS
  Filled 2019-12-12 (×18): qty 2

## 2019-12-12 MED ORDER — HEPARIN SODIUM (PORCINE) 5000 UNIT/ML IJ SOLN
5000.0000 [IU] | Freq: Three times a day (TID) | INTRAMUSCULAR | Status: AC
  Administered 2019-12-12 – 2019-12-16 (×15): 5000 [IU] via SUBCUTANEOUS
  Filled 2019-12-12 (×15): qty 1

## 2019-12-12 NOTE — Progress Notes (Signed)
Pt c/o severe pain (pain level "45") to right abdomen. States this is worse with coughing or hiccups- pt currently has hiccups. PRN Dilaudid given. Pt was then able to pull himself up in bed using bed rails with only minimal assist from RN. IS given and teaching provided- pt was immediately able to hit 2500 multiple times. Pt educated on importance of mobility after surgery- pt agrees to attempt to get OOB this shift. Pt very motivated and is "doing all of this" for his grandchildren. Will continue to educate and encourage pt.

## 2019-12-12 NOTE — Progress Notes (Signed)
Pt sat up in chair for around 1.5 hours. Pt required 2 assist to stand up from side of bed, but did well moving his feet to the chair. Pt did not feel strong enough to ambulate at that time and requested to go back to bed from chair. Pain/nausea meds given and PT to assess pt soon for ambulation.

## 2019-12-12 NOTE — Progress Notes (Signed)
CRITICAL VALUE ALERT  Critical Value:  K level 6.3  Date & Time Notied:  12/12/19, [id number]  Provider Notified: MD Alinda Money paged @ [phone]  Orders Received/Actions taken: See orders MD placed- IV Lasix to be given x1, will recheck BMP at 1400. Pt is on telemetry. Will continue to monitor.

## 2019-12-12 NOTE — Progress Notes (Signed)
Physical Therapy Treatment Patient Details Name: Jimmy [name] MRN: [id number] DOB: 02-Dec-1946 Today's Date: 12/12/2019    History of Present Illness Pt s/p laparoscopic radical cystoprostatectomy and pelvic lymphadenectomy 2* bladder CA    PT Comments    Pt assisted with ambulating short distance in hallway and limited by fatigue, weakness and pain.  Pt requiring increased assist for bed mobility and min assist for ambulation at this time.      Follow Up Recommendations  Home health PT;Supervision for mobility/OOB     Equipment Recommendations  Rolling walker with 5" wheels    Recommendations for Other Services       Precautions / Restrictions Precautions Precautions: Fall Precaution Comments: L JP drain;  R UQ ileal conduit    Mobility  Bed Mobility Overal bed mobility: Needs Assistance Bed Mobility: Supine to Sit;Sit to Supine     Supine to sit: Max assist;HOB elevated Sit to supine: Total assist;+2 for physical assistance;+2 for safety/equipment   General bed mobility comments: elevated HOB and assisted pt with LEs over EOB, pt also required assist for trunk upright; more assist for return to bed for pain control  Transfers Overall transfer level: Needs assistance Equipment used: Rolling walker (2 wheeled) Transfers: Sit to/from Stand Sit to Stand: Min assist;From elevated surface         General transfer comment: verbal cues for safe technique, elevated bed for assist, wide BOS, assist still required for rise and steady  Ambulation/Gait Ambulation/Gait assistance: Min assist;+2 safety/equipment(spouse ambulated with pt as well and provided encouragement) Gait Distance (Feet): 17 Feet(x2) Assistive device: Rolling walker (2 wheeled) Gait Pattern/deviations: Step-through pattern;Decreased stride length;Trunk flexed;Wide base of support     General Gait Details: wide BOS, trunk flexed and required cues for remaining inside RW, slow pace and frequent  standing rest breaks (pt states fatigue and weakness related)   Stairs             Wheelchair Mobility    Modified Rankin (Stroke Patients Only)       Balance Overall balance assessment: Needs assistance         Standing balance support: Bilateral upper extremity supported Standing balance-Leahy Scale: Poor Standing balance comment: reliant on UE support                            Cognition Arousal/Alertness: Awake/alert Behavior During Therapy: Flat affect Overall Cognitive Status: Within Functional Limits for tasks assessed                                 General Comments: minimal verbalizations      Exercises      General Comments        Pertinent Vitals/Pain Pain Assessment: Faces Faces Pain Scale: Hurts even more Pain Location: right groin Pain Descriptors / Indicators: Grimacing;Guarding Pain Intervention(s): Monitored during session;Repositioned;Premedicated before session    Home Living                      Prior Function            PT Goals (current goals can now be found in the care plan section) Progress towards PT goals: Progressing toward goals    Frequency    Min 3X/week      PT Plan Current plan remains appropriate    Co-evaluation  AM-PAC PT "6 Clicks" Mobility   Outcome Measure  Help needed turning from your back to your side while in a flat bed without using bedrails?: A Lot Help needed moving from lying on your back to sitting on the side of a flat bed without using bedrails?: Total Help needed moving to and from a bed to a chair (including a wheelchair)?: A Lot Help needed standing up from a chair using your arms (e.g., wheelchair or bedside chair)?: A Lot Help needed to walk in hospital room?: A Lot Help needed climbing 3-5 steps with a railing? : Total 6 Click Score: 10    End of Session Equipment Utilized During Treatment: Gait belt Activity Tolerance:  Patient limited by fatigue Patient left: in bed;with call bell/phone within reach;with bed alarm set Nurse Communication: Mobility status PT Visit Diagnosis: Difficulty in walking, not elsewhere classified (R26.2)     Time: [id number] PT Time Calculation (min) (ACUTE ONLY): 17 min  Charges:  $Gait Training: 8-22 mins                     Arlyce Dice, DPT Acute Rehabilitation Services Office: [place] [facility] 12/12/2019, 4:42 PM

## 2019-12-12 NOTE — Progress Notes (Addendum)
Patient ID: Jimmy [name], male   DOB: 1946/05/02, 73 y.o.   MRN: [id number]   2 Days Post-Op Subjective: Patient continue to feel better with improved pain control since yesterday morning.  He has been able to begin ambulating albeit slowly.  No nausea or vomiting.  No flatus.  Urine output has not been recorded overnight yet.  Objective: Vital signs in last 24 hours: Temp:  [98.2 F (36.8 C)-98.6 F (37 C)] 98.6 F (37 C) (12/12 0450) Pulse Rate:  [103-123] 108 (12/12 0450) Resp:  [10-20] 20 (12/12 0450) BP: (94-145)/(47-84) 145/71 (12/12 0450) SpO2:  [83 %-97 %] 96 % (12/12 0450)  Intake/Output from previous day: 12/11 0701 - 12/12 0700 In: 2939.5 [I.V.:2454.1; IV Piggyback:485.3] Out: 600 [Urine:500; Drains:100] Intake/Output this shift: Total I/O In: -  Out: 30 [Drains:30]  Physical Exam:  General: Alert and oriented CV: Regular, tachycardic Lungs: Clear Abdomen: Soft, ND, no bowel sounds, stoma draining well, stents in place Incisions: C/D/I Ext: NT, No erythema  Lab Results: Recent Labs    12/11/19 0209 12/12/19 0520  HGB 10.6* 9.2*  HCT 34.2* 31.7*   BMET Recent Labs    12/11/19 1412 12/12/19 0520  NA 138 135  K 5.6* 6.3*  CL 108 105  CO2 21* 18*  GLUCOSE 151* 167*  BUN 27* 34*  CREATININE 2.08* 2.65*  CALCIUM 8.3* 8.2*     Studies/Results: No results found.  Assessment/Plan: 1.  Bladder cancer: Pathology pending. 2.  Postoperative care: We will switch DVT prophylaxis to subcu heparin due to renal function.  Begin ambulating every 6 hours with a goal of at least 4 times today.  Incentive spirometry.  Continue to monitor abdominal drain.  Continue PT and ostomy care. 3.  AKI/hyperkalemia: Likely multifactorial due to transient obstruction of ureters during the procedure and possibly prerenal.  He will receive furosemide 20 mg IV now with plans to recheck his potassium and renal function this afternoon.  Continue telemetry.  If his renal function  continues to worsen tomorrow, will consider renal imaging. 4.  GI: Continue Entereg, Reglan, gum chewing, and Dulcolax suppository.  Ambulation.  We will consider beginning liquid diet this afternoon.   LOS: 2 days   Jimmy [name] 12/12/2019, 11:09 AM

## 2019-12-13 ENCOUNTER — Inpatient Hospital Stay (HOSPITAL_COMMUNITY): Payer: Medicare Other

## 2019-12-13 LAB — GLUCOSE, CAPILLARY
Glucose-Capillary: 134 mg/dL — ABNORMAL HIGH (ref 70–99)
Glucose-Capillary: 141 mg/dL — ABNORMAL HIGH (ref 70–99)
Glucose-Capillary: 150 mg/dL — ABNORMAL HIGH (ref 70–99)
Glucose-Capillary: 155 mg/dL — ABNORMAL HIGH (ref 70–99)
Glucose-Capillary: 174 mg/dL — ABNORMAL HIGH (ref 70–99)
Glucose-Capillary: 210 mg/dL — ABNORMAL HIGH (ref 70–99)

## 2019-12-13 LAB — BASIC METABOLIC PANEL
Anion gap: 12 (ref 5–15)
Anion gap: 3 — ABNORMAL LOW (ref 5–15)
BUN: 41 mg/dL — ABNORMAL HIGH (ref 8–23)
BUN: 43 mg/dL — ABNORMAL HIGH (ref 8–23)
CO2: 22 mmol/L (ref 22–32)
CO2: 24 mmol/L (ref 22–32)
Calcium: 8.5 mg/dL — ABNORMAL LOW (ref 8.9–10.3)
Calcium: 8.6 mg/dL — ABNORMAL LOW (ref 8.9–10.3)
Chloride: 108 mmol/L (ref 98–111)
Chloride: 110 mmol/L (ref 98–111)
Creatinine, Ser: 2.71 mg/dL — ABNORMAL HIGH (ref 0.61–1.24)
Creatinine, Ser: 2.49 mg/dL — ABNORMAL HIGH (ref 0.61–1.24)
GFR calc Af Amer: 26 mL/min — ABNORMAL LOW (ref >60)
GFR calc Af Amer: 29 mL/min — ABNORMAL LOW (ref >60)
GFR calc non Af Amer: 22 mL/min — ABNORMAL LOW (ref >60)
GFR calc non Af Amer: 25 mL/min — ABNORMAL LOW (ref >60)
Glucose, Bld: 213 mg/dL — ABNORMAL HIGH (ref 70–99)
Glucose, Bld: 164 mg/dL — ABNORMAL HIGH (ref 70–99)
Potassium: 5.3 mmol/L — ABNORMAL HIGH (ref 3.5–5.1)
Potassium: 4.6 mmol/L (ref 3.5–5.1)
Sodium: 142 mmol/L (ref 135–145)
Sodium: 137 mmol/L (ref 135–145)

## 2019-12-13 LAB — CREATININE, FLUID (PLEURAL, PERITONEAL, JP DRAINAGE): Creat, Fluid: 2.6 mg/dL

## 2019-12-13 LAB — HEMOGLOBIN AND HEMATOCRIT, BLOOD
HCT: 32.3 % — ABNORMAL LOW (ref 39.0–52.0)
Hemoglobin: 9.7 g/dL — ABNORMAL LOW (ref 13.0–17.0)

## 2019-12-13 MED ORDER — PROMETHAZINE HCL 25 MG/ML IJ SOLN
12.5000 mg | Freq: Once | INTRAMUSCULAR | Status: AC
  Administered 2019-12-13: 12.5 mg via INTRAVENOUS
  Filled 2019-12-13: qty 1

## 2019-12-13 MED ORDER — PANTOPRAZOLE SODIUM 40 MG IV SOLR
40.0000 mg | INTRAVENOUS | Status: DC
  Administered 2019-12-13 – 2019-12-18 (×6): 40 mg via INTRAVENOUS
  Filled 2019-12-13 (×6): qty 40

## 2019-12-13 MED ORDER — LIP MEDEX EX OINT
TOPICAL_OINTMENT | CUTANEOUS | Status: AC
  Administered 2019-12-13: 11:00:00
  Filled 2019-12-13: qty 7

## 2019-12-13 MED ORDER — PROMETHAZINE HCL 25 MG/ML IJ SOLN: 12.5000 mg | Freq: Once | INTRAMUSCULAR | Status: DC

## 2019-12-13 NOTE — Progress Notes (Signed)
Patient assisted back to bed per request with 2 assist. Pt refused to ambulate in hall again before going back to bed- RN encouraged multiple times. Pt agreed to ambulate in hall later this evening before going to sleep. NGT continues to drain light brown drainage to suction cannister. Amount decreased from this AM. Pt resting comfortably in bed, will continue to monitor.

## 2019-12-13 NOTE — Progress Notes (Signed)
NG tube inserted, per order. 950 mL of brown/green fluid immediately was suctioned out into canister. NGT placement verified with KUB. NGT is connected to low intermittent wall suction.

## 2019-12-13 NOTE — Progress Notes (Signed)
Pt has vomited x3 since 2330. Emesis is in very large amounts and is brown/green. Zofran given without relief. Alinda Money, MD made aware and order for promethezine 12.5 mg x1 given and carried out. Will continue to monitor pt.

## 2019-12-13 NOTE — Progress Notes (Signed)
Pt ambulated in hallway with PT, then sat up in chair for close to 2 hours. Pt refused to ambulate again before going back to bed. Pt denying pain at this time, but c/o feeling tired. Wife at bedside. Pt agrees to ambulate again this afternoon. Will continue to encourage mobility and IS use.

## 2019-12-13 NOTE — Progress Notes (Signed)
Physical Therapy Treatment Patient Details Name: Jimmy [name] MRN: [id number] DOB: 22-Dec-1946 Today's Date: 12/13/2019    History of Present Illness Pt s/p laparoscopic radical cystoprostatectomy and pelvic lymphadenectomy 2* bladder CA    PT Comments    Pt cooperative and motivated (called out to desk asking to walk) but requiring increased time for all tasks and progressing slowly toward mobility goals.   Follow Up Recommendations  Home health PT;Supervision for mobility/OOB     Equipment Recommendations  Rolling walker with 5" wheels    Recommendations for Other Services OT consult     Precautions / Restrictions Precautions Precautions: Fall Precaution Comments: L JP drain;  R UQ ileal conduit, NG tube placed 12/12/19 Restrictions Weight Bearing Restrictions: No    Mobility  Bed Mobility Overal bed mobility: Needs Assistance Bed Mobility: Supine to Sit     Supine to sit: +2 for safety/equipment;+2 for physical assistance;HOB elevated;Mod assist     General bed mobility comments: elevated HOB and assisted pt with LEs over EOB, pt also required assist for trunk upright  Transfers Overall transfer level: Needs assistance Equipment used: Rolling walker (2 wheeled) Transfers: Sit to/from Stand Sit to Stand: Min assist;From elevated surface;+2 safety/equipment         General transfer comment: verbal cues for safe technique, elevated bed for assist, wide BOS, assist still required for rise and steady  Ambulation/Gait Ambulation/Gait assistance: Min assist;+2 safety/equipment Gait Distance (Feet): 38 Feet Assistive device: Rolling walker (2 wheeled) Gait Pattern/deviations: Step-through pattern;Decreased stride length;Trunk flexed;Wide base of support Gait velocity: decr   General Gait Details: wide BOS, trunk flexed and required cues for remaining inside RW, slow pace and frequent standing rest breaks (pt states fatigue and weakness related)   Stairs             Wheelchair Mobility    Modified Rankin (Stroke Patients Only)       Balance Overall balance assessment: Needs assistance Sitting-balance support: Bilateral upper extremity supported;Feet supported Sitting balance-Leahy Scale: Poor   Postural control: Posterior lean Standing balance support: Bilateral upper extremity supported Standing balance-Leahy Scale: Poor Standing balance comment: reliant on UE support                            Cognition Arousal/Alertness: Awake/alert Behavior During Therapy: Flat affect Overall Cognitive Status: Within Functional Limits for tasks assessed                                 General Comments: minimal verbalizations      Exercises General Exercises - Lower Extremity Ankle Circles/Pumps: AROM;Both;15 reps;Supine    General Comments        Pertinent Vitals/Pain Pain Assessment: 0-10 Pain Score: 5  Pain Location: right groin Pain Descriptors / Indicators: Sore;Grimacing;Guarding Pain Intervention(s): Limited activity within patient's tolerance;Monitored during session;Premedicated before session    Home Living                      Prior Function            PT Goals (current goals can now be found in the care plan section) Acute Rehab PT Goals Patient Stated Goal: Less pain PT Goal Formulation: With patient Time For Goal Achievement: 12/25/19 Potential to Achieve Goals: Fair Progress towards PT goals: Progressing toward goals    Frequency    Min 3X/week  PT Plan Current plan remains appropriate    Co-evaluation              AM-PAC PT "6 Clicks" Mobility   Outcome Measure  Help needed turning from your back to your side while in a flat bed without using bedrails?: A Lot Help needed moving from lying on your back to sitting on the side of a flat bed without using bedrails?: A Lot Help needed moving to and from a bed to a chair (including a wheelchair)?: A  Lot Help needed standing up from a chair using your arms (e.g., wheelchair or bedside chair)?: A Lot Help needed to walk in hospital room?: A Lot Help needed climbing 3-5 steps with a railing? : Total 6 Click Score: 11    End of Session Equipment Utilized During Treatment: Gait belt Activity Tolerance: Patient limited by fatigue Patient left: in chair;with call bell/phone within reach;with family/visitor present Nurse Communication: Mobility status PT Visit Diagnosis: Difficulty in walking, not elsewhere classified (R26.2)     Time: [id number] PT Time Calculation (min) (ACUTE ONLY): 29 min  Charges:  $Gait Training: 8-22 mins $Therapeutic Activity: 8-22 mins                     [name] Pager [phone] Office [phone]    Tienna Bienkowski 12/13/2019, 1:22 PM

## 2019-12-13 NOTE — Progress Notes (Signed)
Pt vomited a very large amount again <45 min following administration of phenergan. Alinda Money, MD paged back regarding this. Order for NG tube to low intermittent suction given and carried out.

## 2019-12-13 NOTE — Progress Notes (Signed)
Patient's NGT drainage suddenly turned to bright red while pt lying in bed. Only a medium amount of bright red drainage in tubing of NGT. Drainage color had previous been dark brown. NGT removed from suction and clamped. Placement verified with air bolus with verification from a second RN. Pt denied new pain or discomfort. MD Alinda Money paged and did assess patient at bedside. MD stated to flush NGT and return to suction.  Pt then ambulated in hallway around 60 feet using RW. Pt tolerated walk fair, was joking with wife after walk. Seems more talkative and to have more energy than this AM. Pt assisted back to his room in rolling chair. Pt now sitting up in chair, NGT returned to suction with light brown drainage. Will continue to monitor closely.

## 2019-12-13 NOTE — Progress Notes (Signed)
Patient ID: Jimmy [name], male   DOB: August 25, 1946, 73 y.o.   MRN: [id number]  3 Days Post-Op Subjective: He was started on a clear liquid diet last night but before taking much in by mouth, he became nauseated and had multiple episodes of projectile emesis.  This was nonresponsive to antiemetics and he did have a nasogastric tube placed.  He had significant relief after placement but has had a large amount of NG output with an estimated amount of 1.5 L.  Currently, he has no specific complaints.  His abdomen is much improved and his pain is minimal.  He was noted to have some increased right CVA tenderness last night which appears improved.  Objective: Vital signs in last 24 hours: Temp:  [98.5 F (36.9 C)-99.1 F (37.3 C)] 98.6 F (37 C) (12/13 0441) Pulse Rate:  [104-114] 104 (12/13 0441) Resp:  [18-20] 20 (12/13 0441) BP: (136-159)/(65-72) 159/66 (12/13 0441) SpO2:  [90 %-100 %] 97 % (12/13 0441)  Intake/Output from previous day: 12/12 0701 - 12/13 0700 In: 2591.1 [P.O.:50; I.V.:2541.1] Out: 3640 [Urine:1575; Emesis/NG output:1550; Drains:515] Intake/Output this shift: Total I/O In: -  Out: 60 [Drains:60]  Physical Exam:  General: Alert and oriented CV: Regular, slightly tachycardic Lungs: Clear Abdomen: Soft, no distention, no bowel sounds, stoma draining clear urine Incisions: Clean dry and intact Ext: NT, No erythema  Lab Results: Recent Labs    12/11/19 0209 12/12/19 0520 12/13/19 0534  HGB 10.6* 9.2* 9.7*  HCT 34.2* 31.7* 32.3*   BMET Recent Labs    12/12/19 1349 12/13/19 0534  NA 137 142  K 5.7* 5.3*  CL 106 108  CO2 21* 22  GLUCOSE 177* 213*  BUN 39* 41*  CREATININE 2.96* 2.71*  CALCIUM 8.4* 8.5*     Studies/Results: DG Abd Portable 1V  Result Date: 12/13/2019 CLINICAL DATA:  Check gastric catheter placement EXAM: PORTABLE ABDOMEN - 1 VIEW COMPARISON:  None. FINDINGS: Scattered large and small bowel gas is noted. Gastric catheter is noted the  stomach. No free air is seen. IMPRESSION: Gastric catheter noted in the stomach. Electronically Signed   By: Inez Catalina M.D.   On: 12/13/2019 03:24    Assessment/Plan: Postop day #3 status post radical cystoprostatectomy, bilateral pelvic lymphadenectomy, and ileal conduit urinary diversion 1.  Bladder cancer: Pathology is pending. 2.  Acute kidney injury/hyperkalemia: His renal function is mildly improved today.  Potassium is mildly improved today.  Recheck electrolytes and renal function this afternoon.  Continue IV fluids. 3.  GI: Continue nasogastric tube to low wall intermittent suction.  Continue ambulation, Entereg, and promotility therapy.  We'll continue with bowel rest and reassess for return of bowel function. 4.  Postoperative care: Continue DVT prophylaxis with subcutaneous heparin/SCDs.  Continue incentive spirometry.  Continue ambulation.  Continue physical therapy/enterostomal care.   LOS: 3 days   [name] 12/13/2019, 9:19 AM

## 2019-12-14 ENCOUNTER — Other Ambulatory Visit: Payer: Self-pay

## 2019-12-14 ENCOUNTER — Encounter: Payer: Self-pay | Admitting: *Deleted

## 2019-12-14 LAB — BASIC METABOLIC PANEL
Anion gap: 9 (ref 5–15)
BUN: 46 mg/dL — ABNORMAL HIGH (ref 8–23)
CO2: 23 mmol/L (ref 22–32)
Calcium: 8.3 mg/dL — ABNORMAL LOW (ref 8.9–10.3)
Chloride: 115 mmol/L — ABNORMAL HIGH (ref 98–111)
Creatinine, Ser: 2.21 mg/dL — ABNORMAL HIGH (ref 0.61–1.24)
GFR calc Af Amer: 33 mL/min — ABNORMAL LOW (ref >60)
GFR calc non Af Amer: 28 mL/min — ABNORMAL LOW (ref >60)
Glucose, Bld: 157 mg/dL — ABNORMAL HIGH (ref 70–99)
Potassium: 4.6 mmol/L (ref 3.5–5.1)
Sodium: 147 mmol/L — ABNORMAL HIGH (ref 135–145)

## 2019-12-14 LAB — CBC
HCT: 31.5 % — ABNORMAL LOW (ref 39.0–52.0)
Hemoglobin: 9.4 g/dL — ABNORMAL LOW (ref 13.0–17.0)
MCH: 31.5 pg (ref 26.0–34.0)
MCHC: 29.8 g/dL — ABNORMAL LOW (ref 30.0–36.0)
MCV: 105.7 fL — ABNORMAL HIGH (ref 80.0–100.0)
Platelets: 154 10*3/uL (ref 150–400)
RBC: 2.98 MIL/uL — ABNORMAL LOW (ref 4.22–5.81)
RDW: 15.4 % (ref 11.5–15.5)
WBC: 11.3 10*3/uL — ABNORMAL HIGH (ref 4.0–10.5)
nRBC: 0 % (ref 0.0–0.2)

## 2019-12-14 LAB — GLUCOSE, CAPILLARY
Glucose-Capillary: 174 mg/dL — ABNORMAL HIGH (ref 70–99)
Glucose-Capillary: 142 mg/dL — ABNORMAL HIGH (ref 70–99)
Glucose-Capillary: 125 mg/dL — ABNORMAL HIGH (ref 70–99)
Glucose-Capillary: 149 mg/dL — ABNORMAL HIGH (ref 70–99)
Glucose-Capillary: 139 mg/dL — ABNORMAL HIGH (ref 70–99)
Glucose-Capillary: 121 mg/dL — ABNORMAL HIGH (ref 70–99)

## 2019-12-14 MED ORDER — HYDROMORPHONE 1 MG/ML IV SOLN: INTRAVENOUS | Status: DC

## 2019-12-14 MED ORDER — ACETAMINOPHEN 10 MG/ML IV SOLN
1000.0000 mg | Freq: Four times a day (QID) | INTRAVENOUS | Status: AC
  Administered 2019-12-14 – 2019-12-15 (×4): 1000 mg via INTRAVENOUS
  Filled 2019-12-14 (×4): qty 100

## 2019-12-14 MED ORDER — NALOXONE HCL 0.4 MG/ML IJ SOLN: 0.4000 mg | INTRAMUSCULAR | Status: DC | PRN

## 2019-12-14 MED ORDER — SODIUM CHLORIDE 0.9% FLUSH: 9.0000 mL | INTRAVENOUS | Status: DC | PRN

## 2019-12-14 NOTE — Plan of Care (Signed)

## 2019-12-14 NOTE — Plan of Care (Signed)
  Problem: Education: Goal: Knowledge of General Education information will improve Description: Including pain rating scale, medication(s)/side effects and non-pharmacologic comfort measures Outcome: Progressing   Problem: Clinical Measurements: Goal: Ability to maintain clinical measurements within normal limits will improve Outcome: Progressing Goal: Will remain free from infection Outcome: Progressing Goal: Respiratory complications will improve Outcome: Progressing   Problem: Activity: Goal: Risk for activity intolerance will decrease Outcome: Progressing   Problem: Elimination: Goal: Will not experience complications related to bowel motility Outcome: Progressing

## 2019-12-14 NOTE — Consult Note (Addendum)
[place] Nurse ostomy follow-up consult note  Stoma type/location: RUQ ileal conduit with 2 stents intact; Red = right, Blue = left Stomal assessment/size: Stoma is red and viable, slightly above skin level, oval, widest width measures 1 and 3/8 inches. Peristomal assessment: Intact with sutures Treatment options for stomal/peristomal skin: skin barrier ring and convex pouching system Ostomy pouching: 1pc.convex urostomy pouch with skin barrier ring  Education provided: Demonstrated pouch change using hand held mirror.  Pt assisted with application and was able to open and close the spout and attach and disconnect the bedside drainage bag.  Reviewed ostomy routines and how to order supplies.  Taught that pouch changes are twice weekly, emptying is several times a day.   4 extra sets of barrier rings and convex pouches at the bedside.   Educational folder was previously provided to patient's wife during teaching session on Fri according to progress notes. Enrolled patient in [place] program: Yes, previously. Pt could benefit from home health assistance after discharge.  [place] team will perform another teaching session on Wed. Julien Girt MSN, RN, [place], [place], [place]

## 2019-12-14 NOTE — Progress Notes (Signed)
RN made aware of bloody drainage in NGT canister.  Drainage had again changed from light brown to bright red. NGT verified with air bolus. Pt with no new pain or discomfort.  MD paged and made aware, no new orders.  RN will monitor.

## 2019-12-14 NOTE — Anesthesia Postprocedure Evaluation (Signed)
Anesthesia Post Note  Patient: Jimmy [name]  Procedure(s) Performed: ROBOT ASSISTED LAPAROSCOPIC RADICAL CYSTOPROSTATECTOMY BILATERAL PELVIC LYMPHADENECTOMY, ILEAL CONDUIT URINARY DIVERSION (N/A )     Patient location during evaluation: PACU Anesthesia Type: General Level of consciousness: awake and alert Pain management: pain level controlled Vital Signs Assessment: post-procedure vital signs reviewed and stable Respiratory status: spontaneous breathing, nonlabored ventilation, respiratory function stable and patient connected to nasal cannula oxygen Cardiovascular status: blood pressure returned to baseline and stable Postop Assessment: no apparent nausea or vomiting Anesthetic complications: no    Last Vitals:  Vitals:   12/13/19 2020 12/14/19 0453  BP: 129/86 130/68  Pulse: (!) 109 (!) 101  Resp: 18 18  Temp: 37.1 C 36.7 C  SpO2: 97% 97%    Last Pain:  Vitals:   12/14/19 0821  TempSrc:   PainSc: 9                  Dell Hurtubise L Roshawn Lacina

## 2019-12-14 NOTE — TOC Initial Note (Signed)
Transition of Care [facility]) - Initial/Assessment Note    Patient Details  Name: Jimmy [name] MRN: [id number] Date of Birth: 12-05-1946  Transition of Care [facility]) CM/SW Contact:    Wende Neighbors, LCSW Phone Number: 12/14/2019, 12:26 PM  Clinical Narrative:    CSW met patient and spouse at bedside to discuss discharge plans. Patient was sleeping during assessment. CSW spoke to spouse [name]. [name] stated that patient lives at home with her and she will be able to assist him with his care at home. Linda agreeable to have HHPT and HHRN follow patient in the home. CSW stated to spouse that [facility] will look over patient clinicals to see if they will be able to care for him in the home. Spouse stated she is agreeable with plan. CSW waiting for approval from [facility] care    Expected Discharge Plan: [place] Barriers to Discharge: Continued Medical Work up   Patient Goals and CMS Choice Patient states their goals for this hospitalization and ongoing recovery are:: to be home CMS Medicare.gov Compare Post Acute Care list provided to:: Other (Comment Required)(spouse) Choice offered to / list presented to : Spouse  Expected Discharge Plan and Services Expected Discharge Plan: [place] In-house Referral: Clinical Social Work     Living arrangements for the past 2 months: [place]                 DME Arranged: Gilford Rile           [facility] Agency: [place] Date [place]: 12/14/19 Time [place]: 1224 Representative spoke with at [place]: [place] Arrangements/Services Living arrangements for the past 2 months: [place] with:: Spouse, Self Patient language and need for interpreter reviewed:: Yes Do you feel safe going back to the place where you live?: Yes      Need for Family Participation in Patient Care: Yes (Comment) Care giver support system in place?: Yes  (comment)   Criminal Activity/Legal Involvement Pertinent to Current Situation/Hospitalization: No - Comment as needed  Activities of Daily Living Home Assistive Devices/Equipment: Eyeglasses, Hearing aid ADL Screening (condition at time of admission) Patient's cognitive ability adequate to safely complete daily activities?: Yes Is the patient deaf or have difficulty hearing?: Yes Does the patient have difficulty seeing, even when wearing glasses/contacts?: No Does the patient have difficulty concentrating, remembering, or making decisions?: No Patient able to express need for assistance with ADLs?: Yes Does the patient have difficulty dressing or bathing?: No Independently performs ADLs?: Yes (appropriate for developmental age) Does the patient have difficulty walking or climbing stairs?: No Weakness of Legs: None Weakness of Arms/Hands: None  Permission Sought/Granted Permission sought to share information with : Family Supports Permission granted to share information with : Yes, Verbal Permission Granted  Share Information with NAME: Kohler Pellerito     Permission granted to share info w Relationship: spouse  Permission granted to share info w Contact Information: [phone]  Emotional Assessment Appearance:: Appears stated age Attitude/Demeanor/Rapport: Unable to Assess(patient was in pain and slept during assessment) Affect (typically observed): Unable to Assess Orientation: : Oriented to Self, Oriented to Place, Oriented to Situation, Oriented to  Time Alcohol / Substance Use: Not Applicable Psych Involvement: No (comment)  Admission diagnosis:  Bladder cancer [facility]) [C67.9] Patient Active Problem List   Diagnosis Date Noted  . Bladder cancer ([place]) 12/10/2019  . Port-A-Cath in place 08/07/2019  .  Goals of care, counseling/discussion 07/24/2019  . Malignant neoplasm of urinary bladder ([place]) 07/24/2019  . Senile purpura ([place]) 06/03/2019  . Solitary pulmonary nodule  present on computed tomography of lung 04/02/2018  . Type II diabetes mellitus with complication ([place]) 50/53/9767  . Osteoporosis due to androgen therapy 09/16/2012  . Coronary artery disease involving native coronary artery of native heart without angina pectoris 08/29/2011  . Morbid obesity ([place]) 08/29/2011  . History of prostate cancer 08/29/2011  . Sleep apnea 08/29/2011  . Hypertension associated with type 2 diabetes mellitus ([place]) 08/29/2011  . Hyperlipidemia associated with type 2 diabetes mellitus ([place]) 08/29/2011   PCP:  [name], MD Pharmacy:   [id number] [place] [id number]: [phone]: [phone]    Social Determinants of Health (SDOH) Interventions    Readmission Risk Interventions No flowsheet data found.

## 2019-12-14 NOTE — Progress Notes (Signed)
Patient ID: Jimmy [name], male   DOB: 11-30-1946, 73 y.o.   MRN: [id number]  4 Days Post-Op Subjective: Pt without new complaints overnight.  Still with significant NG output.  No flatus.  Ambulating with assistance.  PT recommends home health PT.  Objective: Vital signs in last 24 hours: Temp:  [97.9 F (36.6 C)-98.8 F (37.1 C)] 98 F (36.7 C) (12/14 0453) Pulse Rate:  [101-113] 101 (12/14 0453) Resp:  [18-20] 18 (12/14 0453) BP: (129-138)/(68-86) 130/68 (12/14 0453) SpO2:  [97 %-98 %] 97 % (12/14 0453)  Intake/Output from previous day: 12/13 0701 - 12/14 0700 In: 3371 [I.V.:2921; NG/GT:200] Out: 2895 [Urine:1150; Emesis/NG output:1400; Drains:345] Intake/Output this shift: No intake/output data recorded.  Physical Exam:  General: Alert and oriented CV: RRR Lungs: Clear Abdomen: Soft, ND Incisions: Ext: NT, No erythema  Lab Results: Recent Labs    12/12/19 0520 12/13/19 0534 12/14/19 0538  HGB 9.2* 9.7* 9.4*  HCT 31.7* 32.3* 31.5*   BMET Recent Labs    12/13/19 1359 12/14/19 0538  NA 137 147*  K 4.6 4.6  CL 110 115*  CO2 24 23  GLUCOSE 164* 157*  BUN 43* 46*  CREATININE 2.49* 2.21*  CALCIUM 8.6* 8.3*     Studies/Results: DG Abd Portable 1V  Result Date: 12/13/2019 CLINICAL DATA:  Check gastric catheter placement EXAM: PORTABLE ABDOMEN - 1 VIEW COMPARISON:  None. FINDINGS: Scattered large and small bowel gas is noted. Gastric catheter is noted the stomach. No free air is seen. IMPRESSION: Gastric catheter noted in the stomach. Electronically Signed   By: Inez Catalina M.D.   On: 12/13/2019 03:24    Drain Cr 2.6 yesterday c/w serum.  Assessment/Plan: Postop day #4 status post radical cystoprostatectomy, bilateral pelvic lymphadenectomy, and ileal conduit urinary diversion 1.  Bladder cancer: Pathology is pending. 2.  Acute kidney injury/hyperkalemia: His renal function continues to improve. Continue IV fluids. 3.  GI: Continue nasogastric tube to  low wall intermittent suction.  Continue ambulation, Entereg, and promotility therapy.  Will continue with bowel rest and reassess for return of bowel function later today. 4.  Postoperative care: Continue DVT prophylaxis with subcutaneous heparin/SCDs.  Continue incentive spirometry.  Continue ambulation.  Continue physical therapy/enterostomal care.  Will eventually need home health services for PT and ostomy teaching/care.   LOS: 4 days   Jimmy [name] 12/14/2019, 7:12 AM

## 2019-12-14 NOTE — Care Management Important Message (Signed)
Important Message  Patient Details IM Letter given to [place] Case Manager to present to the Patient Name: Jimmy [name] MRN: [id number] Date of Birth: Nov 17, 1946   Medicare Important Message Given:  Yes     [name] 12/14/2019, 1:20 PM

## 2019-12-15 LAB — BASIC METABOLIC PANEL
Anion gap: 7 (ref 5–15)
BUN: 50 mg/dL — ABNORMAL HIGH (ref 8–23)
CO2: 24 mmol/L (ref 22–32)
Calcium: 8.1 mg/dL — ABNORMAL LOW (ref 8.9–10.3)
Chloride: 119 mmol/L — ABNORMAL HIGH (ref 98–111)
Creatinine, Ser: 2.02 mg/dL — ABNORMAL HIGH (ref 0.61–1.24)
GFR calc Af Amer: 37 mL/min — ABNORMAL LOW (ref >60)
GFR calc non Af Amer: 32 mL/min — ABNORMAL LOW (ref >60)
Glucose, Bld: 144 mg/dL — ABNORMAL HIGH (ref 70–99)
Potassium: 4.4 mmol/L (ref 3.5–5.1)
Sodium: 150 mmol/L — ABNORMAL HIGH (ref 135–145)

## 2019-12-15 LAB — GLUCOSE, CAPILLARY
Glucose-Capillary: 105 mg/dL — ABNORMAL HIGH (ref 70–99)
Glucose-Capillary: 109 mg/dL — ABNORMAL HIGH (ref 70–99)
Glucose-Capillary: 119 mg/dL — ABNORMAL HIGH (ref 70–99)
Glucose-Capillary: 120 mg/dL — ABNORMAL HIGH (ref 70–99)
Glucose-Capillary: 126 mg/dL — ABNORMAL HIGH (ref 70–99)
Glucose-Capillary: 99 mg/dL (ref 70–99)

## 2019-12-15 LAB — CREATININE, FLUID (PLEURAL, PERITONEAL, JP DRAINAGE): Creat, Fluid: 2.1 mg/dL

## 2019-12-15 LAB — HEMOGLOBIN AND HEMATOCRIT, BLOOD
HCT: 30.2 % — ABNORMAL LOW (ref 39.0–52.0)
Hemoglobin: 8.9 g/dL — ABNORMAL LOW (ref 13.0–17.0)

## 2019-12-15 MED ORDER — BISACODYL 10 MG RE SUPP
10.0000 mg | Freq: Once | RECTAL | Status: AC
  Administered 2019-12-15: 08:00:00 10 mg via RECTAL
  Filled 2019-12-15: qty 1

## 2019-12-15 NOTE — Progress Notes (Addendum)
Patient ID: Jimmy [name], male   DOB: Jan 26, 1946, 73 y.o.   MRN: [id number]  5 Days Post-Op Subjective: Pt passed flatus overnight.  Feeling better since yesterday at last evaluation.  Objective: Vital signs in last 24 hours: Temp:  [97.7 F (36.5 C)-97.9 F (36.6 C)] 97.7 F (36.5 C) (12/15 0422) Pulse Rate:  [96-97] 97 (12/15 0422) BP: (130-145)/(67-69) 130/67 (12/15 0422) SpO2:  [95 %-96 %] 95 % (12/15 0422)  Intake/Output from previous day: 12/14 0701 - 12/15 0700 In: 2815.6 [I.V.:2517.1; IV Piggyback:298.5] Out: [id number] [Urine:2200; Emesis/NG output:1400; Drains:1615] Intake/Output this shift: No intake/output data recorded.  Physical Exam:  General: Alert and oriented CV: RRR Lungs: Clear Abdomen: Soft, ND, Minimal tenderness, ostomy pink and viable, stents in place, minimal bowel sounds Incisions: C/D/I Ext: NT, No erythema  Lab Results: Recent Labs    12/13/19 0534 12/14/19 0538 12/15/19 0609  HGB 9.7* 9.4* 8.9*  HCT 32.3* 31.5* 30.2*   BMET Recent Labs    12/14/19 0538 12/15/19 0609  NA 147* 150*  K 4.6 4.4  CL 115* 119*  CO2 23 24  GLUCOSE 157* 144*  BUN 46* 50*  CREATININE 2.21* 2.02*  CALCIUM 8.3* 8.1*     Studies/Results: No results found.  Assessment/Plan: Postop day #5 status post radical cystoprostatectomy, bilateral pelvic lymphadenectomy, and ileal conduit urinary diversion 1. Bladder cancer: Pathology is pending. 2. Acute kidney injury/hyperkalemia: His renal function continues to improve.Continue IV fluids. 3. GI: Bowel function appears to be returning.  Will clamp NG this morning and reassess for removal this afternoon and possibly beginning diet tonight. Continue ambulation, Entereg, and promotility therapy.  4. Postoperative care: Continue DVT prophylaxis with subcutaneous heparin/SCDs. Continue incentive spirometry. Continue ambulation. Continue physical therapy/enterostomal care.  Will eventually need home health services  for PT and ostomy teaching/care.  Will recheck drain Cr today and remove if c/w serum.   LOS: 5 days   [name] 12/15/2019, 7:21 AM

## 2019-12-15 NOTE — Consult Note (Signed)
[facility] Nurse ostomy follow-up consultnote Wife at bedside for teaching session.  Stoma type/location:RUQ ileal conduit with 2 stents intact; Red = right, Blue = left Stomal assessment/size:Stoma is red and viable, slightly above skin level, oval, widest width measures 1 and 3/8 inches. Peristomal assessment:Intact with sutures Treatment options for stomal/peristomal skin:skin barrier ring and convex pouching system Ostomy pouching: 1pc.convex urostomy pouch with skin barrier ring Education provided:Wife assisted with pouch application using barrier ring and convex pouch.  She was able to connect and remove bedside drainage bag.  Reviewed ordering supplies and pouching routines. Taught that pouch changes are twice weekly, emptying is several times a day.   4 extra sets of barrier rings and convex pouches at the bedside.   Educational folder was previously provided to patient's wife during teaching session on Fri according to progress notes. Enrolled patient in [place] program: Yes, previously. Pt could benefit from home health assistance after discharge.  [place] team will perform another teaching session on Fri. Julien Girt MSN, RN, [place], [place], [place]

## 2019-12-16 LAB — BASIC METABOLIC PANEL
Anion gap: 11 (ref 5–15)
BUN: 38 mg/dL — ABNORMAL HIGH (ref 8–23)
CO2: 19 mmol/L — ABNORMAL LOW (ref 22–32)
Calcium: 8.4 mg/dL — ABNORMAL LOW (ref 8.9–10.3)
Chloride: 121 mmol/L — ABNORMAL HIGH (ref 98–111)
Creatinine, Ser: 1.54 mg/dL — ABNORMAL HIGH (ref 0.61–1.24)
GFR calc Af Amer: 51 mL/min — ABNORMAL LOW (ref >60)
GFR calc non Af Amer: 44 mL/min — ABNORMAL LOW (ref >60)
Glucose, Bld: 122 mg/dL — ABNORMAL HIGH (ref 70–99)
Potassium: 5.2 mmol/L — ABNORMAL HIGH (ref 3.5–5.1)
Sodium: 151 mmol/L — ABNORMAL HIGH (ref 135–145)

## 2019-12-16 LAB — GLUCOSE, CAPILLARY
Glucose-Capillary: 106 mg/dL — ABNORMAL HIGH (ref 70–99)
Glucose-Capillary: 100 mg/dL — ABNORMAL HIGH (ref 70–99)
Glucose-Capillary: 102 mg/dL — ABNORMAL HIGH (ref 70–99)
Glucose-Capillary: 105 mg/dL — ABNORMAL HIGH (ref 70–99)
Glucose-Capillary: 125 mg/dL — ABNORMAL HIGH (ref 70–99)
Glucose-Capillary: 133 mg/dL — ABNORMAL HIGH (ref 70–99)

## 2019-12-16 LAB — HEMOGLOBIN AND HEMATOCRIT, BLOOD
HCT: 32.4 % — ABNORMAL LOW (ref 39.0–52.0)
Hemoglobin: 10.1 g/dL — ABNORMAL LOW (ref 13.0–17.0)

## 2019-12-16 MED ORDER — GLUCERNA SHAKE PO LIQD
237.0000 mL | Freq: Three times a day (TID) | ORAL | Status: DC
  Administered 2019-12-17 – 2019-12-18 (×5): 237 mL via ORAL
  Filled 2019-12-16 (×6): qty 237

## 2019-12-16 MED ORDER — SODIUM CHLORIDE 0.9 % IV SOLN: INTRAVENOUS | Status: DC

## 2019-12-16 MED ORDER — TRAMADOL HCL 50 MG PO TABS
50.0000 mg | ORAL_TABLET | Freq: Four times a day (QID) | ORAL | Status: DC | PRN
  Administered 2019-12-17: 21:00:00 50 mg via ORAL
  Administered 2019-12-17 – 2019-12-18 (×2): 100 mg via ORAL
  Filled 2019-12-16 (×2): qty 2
  Filled 2019-12-16: qty 1

## 2019-12-16 MED ORDER — DEXTROSE-NACL 5-0.45 % IV SOLN: INTRAVENOUS | Status: DC

## 2019-12-16 MED ORDER — ACETAMINOPHEN 325 MG PO TABS
650.0000 mg | ORAL_TABLET | Freq: Four times a day (QID) | ORAL | Status: DC | PRN
  Filled 2019-12-16: qty 2

## 2019-12-16 MED ORDER — ENOXAPARIN SODIUM 40 MG/0.4ML ~~LOC~~ SOLN
40.0000 mg | SUBCUTANEOUS | Status: DC
  Administered 2019-12-17 – 2019-12-18 (×2): 40 mg via SUBCUTANEOUS
  Filled 2019-12-16 (×2): qty 0.4

## 2019-12-16 NOTE — TOC Progression Note (Signed)
Transition of Care [facility]) - Progression Note    Patient Details  Name: Jimmy [name] MRN: [id number] Date of Birth: [birth year]  [facility]) CM/SW Contact  Shomari Scicchitano, Juliann Pulse, RN Phone Number: 12/16/2019, 2:19 PM  Clinical Narrative:  [facility]) rep Hoyle Sauer following for HHRN-urostomy teaching/HHPT.    Expected Discharge Plan: [place] Barriers to Discharge: Continued Medical Work up  Expected Discharge Plan and Services Expected Discharge Plan: [place] In-house Referral: Clinical Social Work     Living arrangements for the past 2 months: Single Family Home                 DME Arranged: Environmental consultant         HH Arranged: RN, PT [place] Agency: [place] Date [place]: 12/16/19 Time [place]: 1419 Representative spoke with at [place]: [place] ([place]) Interventions    Readmission Risk Interventions No flowsheet data found.

## 2019-12-16 NOTE — Progress Notes (Signed)
Physical Therapy Treatment Patient Details Name: Jimmy [name] MRN: [id number] DOB: 07/27/1946 Today's Date: 12/16/2019    History of Present Illness Pt s/p laparoscopic radical cystoprostatectomy and pelvic lymphadenectomy 2* bladder CA    PT Comments    Pt assisted with ambulating in hallway.  Pt continues to require assist for bed mobility and min assist for standing and ambulating.  Pt and spouse report possible d/c home Friday.   Follow Up Recommendations  Home health PT;Supervision for mobility/OOB     Equipment Recommendations  Rolling walker with 5" wheels    Recommendations for Other Services       Precautions / Restrictions Precautions Precautions: Fall Precaution Comments: R UQ ileal conduit    Mobility  Bed Mobility Overal bed mobility: Needs Assistance Bed Mobility: Supine to Sit;Sit to Supine     Supine to sit: +2 for physical assistance;HOB elevated;Mod assist Sit to supine: Mod assist;+2 for physical assistance   General bed mobility comments: elevated HOB and assisted pt with R LE over EOB and trunk upright, assist for LEs onto bed  Transfers Overall transfer level: Needs assistance Equipment used: Rolling walker (2 wheeled) Transfers: Sit to/from Stand Sit to Stand: Min assist;From elevated surface;+2 safety/equipment         General transfer comment: verbal cues for safe technique, elevated bed for assist, wide BOS, assist still required for rise and steady  Ambulation/Gait Ambulation/Gait assistance: Min assist;+2 safety/equipment Gait Distance (Feet): 80 Feet Assistive device: Rolling walker (2 wheeled) Gait Pattern/deviations: Step-through pattern;Decreased stride length;Trunk flexed;Wide base of support Gait velocity: decr   General Gait Details: wide BOS, trunk flexed and required cues for remaining inside RW, slow pace, recliner following for safety however pt declined need   Stairs             Wheelchair Mobility     Modified Rankin (Stroke Patients Only)       Balance                                            Cognition Arousal/Alertness: Awake/alert Behavior During Therapy: Flat affect Overall Cognitive Status: Within Functional Limits for tasks assessed                                 General Comments: minimal verbalizations      Exercises      General Comments        Pertinent Vitals/Pain Pain Assessment: 0-10 Pain Score: 3  Pain Location: right groin Pain Descriptors / Indicators: Sore;Grimacing;Guarding Pain Intervention(s): Monitored during session;Repositioned    Home Living                      Prior Function            PT Goals (current goals can now be found in the care plan section) Progress towards PT goals: Progressing toward goals    Frequency    Min 3X/week      PT Plan Current plan remains appropriate    Co-evaluation              AM-PAC PT "6 Clicks" Mobility   Outcome Measure  Help needed turning from your back to your side while in a flat bed without using bedrails?: A Lot Help needed moving from lying on your  back to sitting on the side of a flat bed without using bedrails?: A Lot Help needed moving to and from a bed to a chair (including a wheelchair)?: A Lot Help needed standing up from a chair using your arms (e.g., wheelchair or bedside chair)?: A Little Help needed to walk in hospital room?: A Little Help needed climbing 3-5 steps with a railing? : A Lot 6 Click Score: 14    End of Session Equipment Utilized During Treatment: Gait belt Activity Tolerance: Patient limited by fatigue Patient left: with call bell/phone within reach;with family/visitor present;in bed;with bed alarm set   PT Visit Diagnosis: Difficulty in walking, not elsewhere classified (R26.2)     Time: [id number] PT Time Calculation (min) (ACUTE ONLY): 19 min  Charges:  $Gait Training: 8-22 mins                      Arlyce Dice, DPT Acute Rehabilitation Services Office: [place] E 12/16/2019, 1:03 PM

## 2019-12-16 NOTE — Progress Notes (Signed)
Patient ID: [place], male   DOB: [birth year], 73 y.o.   MRN: [id number]  6 Days Post-Op Subjective: JP Cr = serum. JP removed yesterday.  NGT clamping trial removed yesterday.  Continues to have flatus overnight x4.  No nausea or emesis. Ate 2 cups of jello.   Continues to feel well.    Objective: Vital signs in last 24 hours: Temp:  [97.8 F (36.6 C)-98.4 F (36.9 C)] 98.4 F (36.9 C) (12/16 0709) Pulse Rate:  [90-93] 90 (12/16 0709) BP: (140-145)/(67-96) 141/80 (12/16 0709) SpO2:  [93 %-97 %] 93 % (12/16 0709)  Intake/Output from previous day: 12/15 0701 - 12/16 0700 In: 1475.1 [P.O.:30; I.V.:1444; IV Piggyback:1.1] Out: 2780 [Urine:2450; Drains:330] Intake/Output this shift: No intake/output data recorded.  Physical Exam:  General: Alert and oriented CV: RRR Lungs: Clear Abdomen: Soft, ND, Minimal tenderness, ostomy pink and viable, stents in place, minimal bowel sounds. JP has been removed Incisions: C/D/I Ext: NT, No erythema  Lab Results: Recent Labs    12/14/19 0538 12/15/19 0609  HGB 9.4* 8.9*  HCT 31.5* 30.2*   BMET Recent Labs    12/14/19 0538 12/15/19 0609  NA 147* 150*  K 4.6 4.4  CL 115* 119*  CO2 23 24  GLUCOSE 157* 144*  BUN 46* 50*  CREATININE 2.21* 2.02*  CALCIUM 8.3* 8.1*     Studies/Results: No results found.  Assessment/Plan: Postop day #6 status post radical cystoprostatectomy, bilateral pelvic lymphadenectomy, and ileal conduit urinary diversion  1. Bladder cancer: Pathology is pending. 2. Acute kidney injury/hyperkalemia: His renal function continues to improve.Continue IV fluids, will ween as takes more PO 3. GI: Bowel function appears to be returning.  Continue ambulation, Entereg, and promotility therapy. CLD today.  4. Postoperative care: Continue DVT prophylaxis with subcutaneous heparin/SCDs. Continue incentive spirometry. Continue ambulation. Continue physical therapy/enterostomal care.  Will eventually  need home health services for PT and ostomy teaching/care.    LOS: 6 days   Nancee Liter 12/16/2019, 7:18 AM

## 2019-12-17 LAB — GLUCOSE, CAPILLARY
Glucose-Capillary: 123 mg/dL — ABNORMAL HIGH (ref 70–99)
Glucose-Capillary: 124 mg/dL — ABNORMAL HIGH (ref 70–99)
Glucose-Capillary: 130 mg/dL — ABNORMAL HIGH (ref 70–99)
Glucose-Capillary: 125 mg/dL — ABNORMAL HIGH (ref 70–99)
Glucose-Capillary: 147 mg/dL — ABNORMAL HIGH (ref 70–99)
Glucose-Capillary: 140 mg/dL — ABNORMAL HIGH (ref 70–99)

## 2019-12-17 LAB — SURGICAL PATHOLOGY

## 2019-12-17 LAB — HEMOGLOBIN AND HEMATOCRIT, BLOOD
HCT: 30 % — ABNORMAL LOW (ref 39.0–52.0)
Hemoglobin: 8.9 g/dL — ABNORMAL LOW (ref 13.0–17.0)

## 2019-12-17 LAB — BASIC METABOLIC PANEL
Anion gap: 9 (ref 5–15)
BUN: 31 mg/dL — ABNORMAL HIGH (ref 8–23)
CO2: 20 mmol/L — ABNORMAL LOW (ref 22–32)
Calcium: 8.2 mg/dL — ABNORMAL LOW (ref 8.9–10.3)
Chloride: 117 mmol/L — ABNORMAL HIGH (ref 98–111)
Creatinine, Ser: 1.45 mg/dL — ABNORMAL HIGH (ref 0.61–1.24)
GFR calc Af Amer: 55 mL/min — ABNORMAL LOW (ref >60)
GFR calc non Af Amer: 47 mL/min — ABNORMAL LOW (ref >60)
Glucose, Bld: 133 mg/dL — ABNORMAL HIGH (ref 70–99)
Potassium: 3.9 mmol/L (ref 3.5–5.1)
Sodium: 146 mmol/L — ABNORMAL HIGH (ref 135–145)

## 2019-12-17 NOTE — Progress Notes (Signed)
Patient ID: Jimmy [name], male   DOB: 10-25-1946, 73 y.o.   MRN: [id number]  7 Days Post-Op Subjective: Pt doing better.  Able to tolerate full liquids yesterday with multiple BMs.  No nausea or vomiting.  Ambulating better with assistance.  Objective: Vital signs in last 24 hours: Temp:  [97.7 F (36.5 C)-98.1 F (36.7 C)] 97.7 F (36.5 C) (12/17 0552) Pulse Rate:  [85-99] 85 (12/17 0552) Resp:  [17-20] 18 (12/17 0552) BP: (136-171)/(73-82) 150/77 (12/17 0552) SpO2:  [93 %-95 %] 94 % (12/17 0552)  Intake/Output from previous day: 12/16 0701 - 12/17 0700 In: 1874.5 [P.O.:480; I.V.:1394.5] Out: 1950 [Urine:1950] Intake/Output this shift: No intake/output data recorded.  Physical Exam:  General: Alert and oriented CV: RRR Lungs: Clear Abdomen: Soft, ND, positive BS, ostomy pink Incisions: C/D/I Ext: NT, No erythema  Lab Results: Recent Labs    12/15/19 0609 12/16/19 0820 12/17/19 0543  HGB 8.9* 10.1* 8.9*  HCT 30.2* 32.4* 30.0*   BMET Recent Labs    12/16/19 0820 12/17/19 0543  NA 151* 146*  K 5.2* 3.9  CL 121* 117*  CO2 19* 20*  GLUCOSE 122* 133*  BUN 38* 31*  CREATININE 1.54* 1.45*  CALCIUM 8.4* 8.2*     Studies/Results: No results found.  Assessment/Plan: Postop day #7status post radical cystoprostatectomy, bilateral pelvic lymphadenectomy, and ileal conduit urinary diversion 1. Bladder cancer: Pathology is pending. 2. Acute kidney injury: His renal functioncontinues to improve.Will change DVT prophylaxis back to Lovenox with plans to continue for 30 days. 3. GI: Carb modified diet today. 4. Postoperative care: Continue DVT prophylaxis. Continue incentive spirometry. Continue ambulation. Continue physical therapy/enterostomal care. 5.  Disposition: If patient tolerates diet today, anticipate discharge home tomorrow morning if home health services/PT arranged.   LOS: 7 days   [name] 12/17/2019, 7:11 AM

## 2019-12-17 NOTE — Progress Notes (Signed)
Physical Therapy Treatment Patient Details Name: Jimmy [name] MRN: [id number] DOB: [birth year] Today's Date: 12/17/2019    History of Present Illness Pt s/p laparoscopic radical cystoprostatectomy and pelvic lymphadenectomy 2* bladder CA    PT Comments    Pt agreeable to mobilize however requested BSC for urgency upon sitting EOB.  Pt with loose BM and increased drainage from penis.  Assisted pt with pericare and changing mesh undergarment with pad.  Pt so fatigued from [facility], he felt unable to tolerate ambulating.  Pt left in recliner end of session.  SPO2 90-91% on room air, and pt with increased RR and work of breathing while on BSC.    Follow Up Recommendations  Home health PT;Supervision for mobility/OOB     Equipment Recommendations  Rolling walker with 5" wheels    Recommendations for Other Services       Precautions / Restrictions Precautions Precautions: Fall Precaution Comments: R UQ ileal conduit    Mobility  Bed Mobility Overal bed mobility: Needs Assistance Bed Mobility: Supine to Sit     Supine to sit: Min assist;HOB elevated     General bed mobility comments: elevated HOB and pt able to self assist using rails (plans to sleep in recliner chair at home)  Transfers Overall transfer level: Needs assistance Equipment used: Rolling walker (2 wheeled) Transfers: Sit to/from [facility] Sit to Stand: Min guard Stand pivot transfers: Min guard       General transfer comment: verbal cues for safe technique, wide BOS, urgent transfer to [facility] and then pt so fatigued after hygiene care that he felt unable to ambulate however agreeable to stay up in recliner  Ambulation/Gait                 Stairs             Wheelchair Mobility    Modified Rankin (Stroke Patients Only)       Balance                                            Cognition Arousal/Alertness: Awake/alert Behavior During Therapy: Flat  affect Overall Cognitive Status: Within Functional Limits for tasks assessed                                        Exercises      General Comments        Pertinent Vitals/Pain Pain Assessment: 0-10 Pain Score: 4  Pain Location: incision Pain Descriptors / Indicators: Sore;Grimacing;Guarding Pain Intervention(s): Premedicated before session;Repositioned;Monitored during session    Home Living                      Prior Function            PT Goals (current goals can now be found in the care plan section) Progress towards PT goals: Progressing toward goals    Frequency    Min 3X/week      PT Plan Current plan remains appropriate    Co-evaluation              AM-PAC PT "6 Clicks" Mobility   Outcome Measure  Help needed turning from your back to your side while in a flat bed without using bedrails?: A Little Help needed moving  from lying on your back to sitting on the side of a flat bed without using bedrails?: A Little Help needed moving to and from a bed to a chair (including a wheelchair)?: A Little Help needed standing up from a chair using your arms (e.g., wheelchair or bedside chair)?: A Little Help needed to walk in hospital room?: A Little Help needed climbing 3-5 steps with a railing? : A Lot 6 Click Score: 17    End of Session Equipment Utilized During Treatment: Gait belt Activity Tolerance: Patient limited by fatigue Patient left: with call bell/phone within reach;in chair Nurse Communication: Mobility status(RN aware pt in recliner, needs bed linen change) PT Visit Diagnosis: Difficulty in walking, not elsewhere classified (R26.2)     Time: [id number] PT Time Calculation (min) (ACUTE ONLY): 28 min  Charges:  $Therapeutic Activity: 23-37 mins                     Jannette Spanner PT, DPT Acute Rehabilitation Services Office: [place] 12/17/2019, 3:48 PM

## 2019-12-17 NOTE — Care Management Important Message (Signed)
Important Message  Patient Details IM Letter given to Dessa Phi RN to present to the Patient Name: Jimmy [name] MRN: [id number] Date of Birth: 11/15/1946   Medicare Important Message Given:  Yes     [name] 12/17/2019, 11:06 AM

## 2019-12-17 NOTE — Progress Notes (Signed)
Patient assisted to sit at the side of the bed, some difficulty present with transitioning from lying to sit due to pain, minimal assistance required. Patient ambulated in the hallway a total of 220 ft with a stand by assist and use of front wheeled walker. Patient tolerated well, without shortness of breath or increase in post surgical pain.

## 2019-12-18 LAB — GLUCOSE, CAPILLARY
Glucose-Capillary: 114 mg/dL — ABNORMAL HIGH (ref 70–99)
Glucose-Capillary: 116 mg/dL — ABNORMAL HIGH (ref 70–99)
Glucose-Capillary: 121 mg/dL — ABNORMAL HIGH (ref 70–99)
Glucose-Capillary: 105 mg/dL — ABNORMAL HIGH (ref 70–99)
Glucose-Capillary: 141 mg/dL — ABNORMAL HIGH (ref 70–99)

## 2019-12-18 MED ORDER — CIPROFLOXACIN HCL 500 MG PO TABS: 500.0000 mg | ORAL_TABLET | Freq: Two times a day (BID) | ORAL | 0 refills | Status: AC

## 2019-12-18 MED ORDER — ENOXAPARIN SODIUM 40 MG/0.4ML ~~LOC~~ SOLN: 40.0000 mg | SUBCUTANEOUS | 0 refills | Status: DC

## 2019-12-18 MED ORDER — TRAMADOL HCL 50 MG PO TABS: 50.0000 mg | ORAL_TABLET | Freq: Four times a day (QID) | ORAL | 0 refills | Status: DC | PRN

## 2019-12-18 NOTE — Progress Notes (Signed)
Paged MD per request with update RE: PT re-eval

## 2019-12-18 NOTE — Progress Notes (Signed)
Patient and his wife given discharge, follow up, and medication instructions, verbalized understanding, IV removed, personal belongings with patient, family to transport home once DME is delivered

## 2019-12-18 NOTE — Discharge Summary (Signed)
[facility] Discharge Summary  Admit date: 12/10/2019  Discharge date and time: 12/18/19   Discharge to: Home  Discharge Service: Urology  Discharge Attending Physician:  Dr. Raynelle Bring  Discharge  Diagnoses: Bladder Cancer  Secondary Diagnosis: Active Problems:   Bladder cancer [facility])  OR Procedures: Procedure(s): ROBOT ASSISTED LAPAROSCOPIC RADICAL CYSTOPROSTATECTOMY BILATERAL PELVIC LYMPHADENECTOMY, ILEAL CONDUIT URINARY DIVERSION 12/10/2019   Ancillary Procedures: None   Discharge Day Services: The patient was seen and examined by the Urology team both in the morning and immediately prior to discharge.  Vital signs and laboratory values were stable and within normal limits.  The physical exam was benign and unchanged and all surgical wounds were examined.  Evaluated by physical therapy and felt appropriate for home health physical therapy services.  Discharge instructions were explained and all questions answered.  Subjective  No acute events overnight. Pain Controlled. No fever or chills.  Objective No data found. No intake/output data recorded.  General Appearance:        No acute distress Lungs:                       Normal work of breathing on room air Heart:                                Regular rate Abdomen:                         Soft, appropriately-tender, non-distended. Obese.  Right lower quadrant ileal conduit with pink viable stoma, 2 Bander stents emitting with clear yellow urine output. Extremities:                      Warm and well perfused Wound:       Midline laparoscopic incisions closed with staples.  Intact and clean.  Minor bruising around right lower quadrant at DVT prophylaxis site.  HPI: Mr. Oria 73yo male. He had presented with gross hematuria and office cystoscopy revealed findings concerning for malignancy of the trigone. On his preoperative CT scan of the abdomen and pelvis, he did not have any evidence of lymphadenopathy or  metastatic disease. He did have right-sided hydronephrosis consistent with ureteral obstruction distally. At the time of his procedure, he is actually felt to have developing ureteral obstruction bilaterally and had bilateral ureteral stents placed. His serum creatinine on 06/29/2019 was 1.2. He underwent resection of a large tumor along the trigone of the bladder. Pathology indicated a high-grade, muscle invasive tumor infiltrating the muscularis propria and with evidence of lamina propria invasion. CT imaging of the chest and abdomen/pelvis is negative for metastatic disease. He saw Dr. Alen Blew on 07/21/19 to discuss his options and to consider neoadjuvant chemotherapy. He has completed 4 cycles of gemcitabine/cisplatin.   His medical history is significant for locally advanced, high risk prostate cancer status post combination external beam radiation therapy and radiation seed implant in 2009 along with 2 years of androgen deprivation therapy. He has been disease free since treatment. He also has a history of hyperlipidemia, hypertension, sleep apnea, and diabetes. He has a history of nonobstructive coronary artery disease initially diagnosed during a cardiac catheterization in 2000. He was most recently seen by Dr. Candee Furbish in January 2019. He underwent an evaluation including a stress test and echocardiogram that was low risk. LV EF was > 65%. He was recently reevaluated by Dr.  Skains who feels no further testing is necessary and he can proceed with surgery with a mild to moderate risk of perioperative complications.   Hospital Course:  The patient underwent robotic assisted laparoscopic converted to open radical cystoprostatectomy with bilateral lymph node dissection and ileal conduit urinary diversion on 12/10/2019.  The patient tolerated the procedure well, was extubated in the OR, and afterwards was taken to the PACU for routine post-surgical care. When stable the patient was transferred to the  stepdown unit per protocol.  Given slightly elevated renal function immediately postoperatively, Toradol and Lovenox were held.  The patient was on DVT prophylaxis with SCDs and subcu heparin.  The patient did develop nausea and vomiting with radiographic evidence of ileus.  Nasogastric tube was placed with relief of symptoms and ultimately resolution of ileus with confirmed passage of flatus.  Nasogastric tube was subsequently removed the patient had multiple bowel movements while still in the hospital..  The patients diet was slowly advanced and at the time of discharge was tolerating a regular diet.  Ultimately, his renal function returned to baseline and he was resumed on Lovenox and provided with teaching.  The patient was discharged home 8 Days Post-Op, at which point was tolerating a regular solid diet, was able to manage his urostomy, have adequate pain control with P.O. pain medication, and could ambulate after evaluation by physical therapy was felt appropriate for home health physical therapy. The patient will follow up with [place] for post op check, staple removal and Bander stent removal.  He will complete 4 weeks of home prophylaxis with Lovenox daily.  Condition at Discharge: Improved  Discharge Medications:  Allergies as of 12/18/2019      Reactions   Ace Inhibitors Cough   Cephalexin Diarrhea, Nausea Only   Crestor [rosuvastatin Calcium]    Lipitor [atorvastatin Calcium] Other (See Comments)   MUSCLE PAINS   Plavix [clopidogrel Bisulfate] Hives      Medication List    STOP taking these medications   multivitamin with minerals tablet   Vitamin D 125 MCG (5000 UT) Caps     TAKE these medications   enoxaparin 40 MG/0.4ML injection Commonly known as: LOVENOX Inject 0.4 mLs (40 mg total) into the skin daily. Start taking on: December 19, 2019 Notes to patient: 12/19/19   losartan 100 MG tablet Commonly known as: COZAAR Take 1 tablet (100 mg total) by mouth daily.    Magnesium 250 MG Tabs Take 1 tablet (250 mg total) by mouth daily.   metFORMIN 1000 MG tablet Commonly known as: GLUCOPHAGE TAKE 1 TABLET BY MOUTH TWICE A DAY WITH MEALS What changed:   how much to take  how to take this  when to take this  additional instructions   ONE TOUCH ULTRA TEST test strip Generic drug: glucose blood USE TO TEST BLOOD SUGAR DAILY AS DIRECTED BY DOCTOR   onetouch ultrasoft lancets 1 each by Other route as needed. Use as instructed   Pitavastatin Calcium 4 MG Tabs Commonly known as: Livalo Take 1 tablet (4 mg total) by mouth daily.   traMADol 50 MG tablet Commonly known as: ULTRAM Take 1-2 tablets (50-100 mg total) by mouth every 6 (six) hours as needed for moderate pain.   Victoza 18 MG/3ML Sopn Generic drug: liraglutide INJECT 1.8 MG UNDER THE SKIN ONCE DAILY What changed: See the new instructions.            Durable Medical Equipment  (From admission, onward)  Start     Ordered   12/18/19 1244  For home use only DME Walker rolling  Once    Comments: Wide rw 5'6" 115.4kg Bariatric  Question:  Patient needs a walker to treat with the following condition  Answer:  Unsteady gait   12/18/19 1243

## 2019-12-18 NOTE — Progress Notes (Signed)
Patient ID: Jimmy [name], male   DOB: August 02, 1946, 73 y.o.   MRN: [id number]  8 Days Post-Op Subjective: Pt had difficulty ambulating yesterday but did better last night.  Tolerated regular diet yesterday.  Objective: Vital signs in last 24 hours: Temp:  [97.8 F (36.6 C)-98.8 F (37.1 C)] 97.8 F (36.6 C) (12/18 0514) Pulse Rate:  [82-97] 82 (12/18 0514) Resp:  [18-20] 18 (12/18 0514) BP: (131-158)/(75-85) 139/75 (12/18 0514) SpO2:  [93 %-96 %] 93 % (12/18 0514)  Intake/Output from previous day: 12/17 0701 - 12/18 0700 In: 557 [P.O.:557] Out: 1550 [Urine:1550] Intake/Output this shift: No intake/output data recorded.  Physical Exam:  General: Alert and oriented CV: RRR Lungs: Clear Abdomen: Soft, ND, Positive BS, ostomy pink Incisions: C/D/I Ext: NT, No erythema  Lab Results: BMET Recent Labs    12/16/19 0820 12/17/19 0543  NA 151* 146*  K 5.2* 3.9  CL 121* 117*  CO2 19* 20*  GLUCOSE 122* 133*  BUN 38* 31*  CREATININE 1.54* 1.45*  CALCIUM 8.4* 8.2*     Studies/Results: Path: pT3 N0 Mx, high grade urothelial carcinoma with negative margins  Assessment/Plan: Postop day #8status post radical cystoprostatectomy, bilateral pelvic lymphadenectomy, and ileal conduit urinary diversion 1. Bladder cancer: Pathology as above. 2. Disposition: Plan for d/c today hopefully.  Will have PT reevaluate to ensure home health PT is appropriate vs SNF and then make plans for d/c.  Will continue Lovenox for 30 days and patient and wife have received instruction and have done this before. Will plan for f/u next week for staple removal.  Continue ostomy teaching at home per home health.    LOS: 8 days   [name] 12/18/2019, 7:59 AM

## 2019-12-18 NOTE — Consult Note (Signed)
WOC Nurse ostomyfollow-upconsultnote Wife at bedside for teaching session.  Stoma type/location:RUQ ileal conduit with 2 stents intact; Red = right, Blue = left Stomal assessment/size:Stoma is red and viable, slightly above skin level, oval, widest width measures 1 and 3/8 inches. Peristomal assessment:Intact with sutures Treatment options for stomal/peristomal skin:skin barrier ring and convex pouching system Ostomy pouching: 1pc.convex urostomy pouch with skin barrier ring Education provided:Wife applied pouch  using barrier ring and convex pouch.  She was able to connect and remove bedside drainage bag.  Reviewed ordering supplies and pouching routines. Taught that pouch changes are twice weekly, emptying is several times a day.  5 extra sets of barrier rings and convex pouches at the bedside.Educational folder was previously provided to patient's wife during teaching session. Enrolled patient in [place] program: Yes,previously.  Julien Girt MSN, RN, [place], [place], [place]

## 2019-12-18 NOTE — TOC Transition Note (Signed)
Transition of Care [facility]) - CM/SW Discharge Note   Patient Details  Name: ALANDO TROTTO MRN: [id number] Date of Birth: 1946/05/12  Transition of [facility]) CM/SW Contact:  Dessa Phi, RN Phone Number: 12/18/2019, 12:25 PM   Clinical Narrative: d/c home w/[facility]HC [place] HHRn/HHPT rep Hoyle Sauer aware of d/c. [facility] rep Thedore Mins to deliver wide rw to rm prior d/c. No further CM needs.      Final next level of care: [place] Barriers to Discharge: No Barriers Identified   Patient Goals and CMS Choice Patient states their goals for this hospitalization and ongoing recovery are:: to be home CMS Medicare.gov Compare Post Acute Care list provided to:: Other (Comment Required)(spouse) Choice offered to / list presented to : Spouse  Discharge Placement                       Discharge Plan and Services In-house Referral: Clinical Social Work              DME Arranged: Gilford Rile rolling(wide) DME Agency: [facility] Date DME Agency Contacted: 12/18/19 Time DME Agency Contacted: [id number] Representative spoke with at DME Agency: zach HH Arranged: PT, RN [place] Agency: [facility] Date [place]: 12/18/19 Time [place]: [phone] Representative spoke with at [place]: [place] ([place]) Interventions     Readmission Risk Interventions No flowsheet data found.

## 2019-12-18 NOTE — Progress Notes (Signed)
Patient requires a bariatric rolling walker due to patient's abdominal girth and edema

## 2019-12-18 NOTE — Progress Notes (Signed)
Physical Therapy Treatment Patient Details Name: Jimmy [name] MRN: [id number] DOB: January 09, 1946 Today's Date: 12/18/2019    History of Present Illness Pt s/p laparoscopic radical cystoprostatectomy and pelvic lymphadenectomy 2* bladder CA    PT Comments    Pt with improved performance today.  Pt did not require any physical assist today.  Pt plans to sleep in recliner chair at home and spouse states pt worked in [employer], occupational right near their house, so they have access to firefighters and EMS assist if/when needed.  Pt prefers d/c home and feels able to perform mobility around home.  Recommend initial supervision for mobility and HHPT.  Pt will need wide RW upon d/c.  Pt states most sitting surfaces in his home are raised or elevated already as well.      Follow Up Recommendations  Home health PT;Supervision for mobility/OOB     Equipment Recommendations  Rolling walker with 5" wheels(wide)    Recommendations for Other Services       Precautions / Restrictions Precautions Precautions: Fall Precaution Comments: R UQ ileal conduit Restrictions Weight Bearing Restrictions: No    Mobility  Bed Mobility Overal bed mobility: Needs Assistance Bed Mobility: Supine to Sit;Sit to Supine     Supine to sit: Supervision;HOB elevated Sit to supine: Supervision;HOB elevated   General bed mobility comments: slow to mobilize however able to perform without physical assist (plans to sleep in recliner chair at home)  Transfers Overall transfer level: Needs assistance Equipment used: Rolling walker (2 wheeled) Transfers: Sit to/from Stand Sit to Stand: Min guard;Supervision         General transfer comment: verbal cues for safe technique, wide BOS  Ambulation/Gait Ambulation/Gait assistance: Min guard Gait Distance (Feet): 140 Feet Assistive device: Rolling walker (2 wheeled) Gait Pattern/deviations: Step-through pattern;Decreased stride length;Trunk flexed;Wide base of support     General Gait Details: wide BOS, trunk flexed and required cues for remaining inside RW, slow pace; improved distance today and pt appears more steady then previous sessions   Stairs             Wheelchair Mobility    Modified Rankin (Stroke Patients Only)       Balance                                            Cognition Arousal/Alertness: Awake/alert Behavior During Therapy: WFL for tasks assessed/performed Overall Cognitive Status: Within Functional Limits for tasks assessed                                        Exercises      General Comments        Pertinent Vitals/Pain Pain Assessment: 0-10 Pain Score: 4  Pain Location: incision Pain Descriptors / Indicators: Sore;Grimacing;Guarding Pain Intervention(s): Monitored during session;Repositioned;Premedicated before session    Home Living                      Prior Function            PT Goals (current goals can now be found in the care plan section) Progress towards PT goals: Progressing toward goals    Frequency    Min 3X/week      PT Plan Current plan remains appropriate    Co-evaluation  AM-PAC PT "6 Clicks" Mobility   Outcome Measure  Help needed turning from your back to your side while in a flat bed without using bedrails?: A Little Help needed moving from lying on your back to sitting on the side of a flat bed without using bedrails?: A Little Help needed moving to and from a bed to a chair (including a wheelchair)?: A Little Help needed standing up from a chair using your arms (e.g., wheelchair or bedside chair)?: A Little Help needed to walk in hospital room?: A Little Help needed climbing 3-5 steps with a railing? : A Lot 6 Click Score: 17    End of Session   Activity Tolerance: Patient tolerated treatment well Patient left: in bed;with call bell/phone within reach;with family/visitor present Nurse Communication:  Mobility status PT Visit Diagnosis: Difficulty in walking, not elsewhere classified (R26.2)     Time: [id number] PT Time Calculation (min) (ACUTE ONLY): 16 min  Charges:  $Gait Training: 8-22 mins           Arlyce Dice, DPT Acute Rehabilitation Services Office: [place] E 12/18/2019, 12:14 PM

## 2019-12-23 ENCOUNTER — Inpatient Hospital Stay (HOSPITAL_COMMUNITY)
Admission: EM | Admit: 2019-12-23 | Discharge: 2020-01-01 | DRG: 872 | Disposition: A | Discharge status: Home Health | Payer: Medicare Other | Attending: Urology | Admitting: Urology

## 2019-12-23 ENCOUNTER — Emergency Department (HOSPITAL_COMMUNITY): Payer: Medicare Other

## 2019-12-23 ENCOUNTER — Encounter (HOSPITAL_COMMUNITY): Payer: Self-pay | Admitting: Emergency Medicine

## 2019-12-23 ENCOUNTER — Other Ambulatory Visit: Payer: Self-pay

## 2019-12-23 DIAGNOSIS — E669 Obesity, unspecified: Secondary | ICD-10-CM | POA: Diagnosis present

## 2019-12-23 DIAGNOSIS — Z7984 Long term (current) use of oral hypoglycemic drugs: Secondary | ICD-10-CM

## 2019-12-23 DIAGNOSIS — I129 Hypertensive chronic kidney disease with stage 1 through stage 4 chronic kidney disease, or unspecified chronic kidney disease: Secondary | ICD-10-CM | POA: Diagnosis present

## 2019-12-23 DIAGNOSIS — N179 Acute kidney failure, unspecified: Secondary | ICD-10-CM | POA: Diagnosis present

## 2019-12-23 DIAGNOSIS — E559 Vitamin D deficiency, unspecified: Secondary | ICD-10-CM | POA: Diagnosis present

## 2019-12-23 DIAGNOSIS — N12 Tubulo-interstitial nephritis, not specified as acute or chronic: Secondary | ICD-10-CM | POA: Diagnosis present

## 2019-12-23 DIAGNOSIS — N1330 Unspecified hydronephrosis: Secondary | ICD-10-CM | POA: Diagnosis present

## 2019-12-23 DIAGNOSIS — L7634 Postprocedural seroma of skin and subcutaneous tissue following other procedure: Secondary | ICD-10-CM | POA: Diagnosis present

## 2019-12-23 DIAGNOSIS — E860 Dehydration: Secondary | ICD-10-CM | POA: Diagnosis not present

## 2019-12-23 DIAGNOSIS — M818 Other osteoporosis without current pathological fracture: Secondary | ICD-10-CM | POA: Diagnosis present

## 2019-12-23 DIAGNOSIS — Z825 Family history of asthma and other chronic lower respiratory diseases: Secondary | ICD-10-CM

## 2019-12-23 DIAGNOSIS — R000 Tachycardia, unspecified: Secondary | ICD-10-CM | POA: Diagnosis present

## 2019-12-23 DIAGNOSIS — Z9079 Acquired absence of other genital organ(s): Secondary | ICD-10-CM

## 2019-12-23 DIAGNOSIS — Z87891 Personal history of nicotine dependence: Secondary | ICD-10-CM

## 2019-12-23 DIAGNOSIS — G4730 Sleep apnea, unspecified: Secondary | ICD-10-CM | POA: Diagnosis present

## 2019-12-23 DIAGNOSIS — C679 Malignant neoplasm of bladder, unspecified: Secondary | ICD-10-CM | POA: Diagnosis present

## 2019-12-23 DIAGNOSIS — Z9221 Personal history of antineoplastic chemotherapy: Secondary | ICD-10-CM

## 2019-12-23 DIAGNOSIS — E8809 Other disorders of plasma-protein metabolism, not elsewhere classified: Secondary | ICD-10-CM | POA: Diagnosis present

## 2019-12-23 DIAGNOSIS — Z936 Other artificial openings of urinary tract status: Secondary | ICD-10-CM

## 2019-12-23 DIAGNOSIS — R7881 Bacteremia: Secondary | ICD-10-CM

## 2019-12-23 DIAGNOSIS — Z888 Allergy status to other drugs, medicaments and biological substances status: Secondary | ICD-10-CM

## 2019-12-23 DIAGNOSIS — Z79899 Other long term (current) drug therapy: Secondary | ICD-10-CM

## 2019-12-23 DIAGNOSIS — E7800 Pure hypercholesterolemia, unspecified: Secondary | ICD-10-CM | POA: Diagnosis present

## 2019-12-23 DIAGNOSIS — S301XXA Contusion of abdominal wall, initial encounter: Secondary | ICD-10-CM

## 2019-12-23 DIAGNOSIS — K760 Fatty (change of) liver, not elsewhere classified: Secondary | ICD-10-CM | POA: Diagnosis present

## 2019-12-23 DIAGNOSIS — A0472 Enterocolitis due to Clostridium difficile, not specified as recurrent: Secondary | ICD-10-CM | POA: Diagnosis present

## 2019-12-23 DIAGNOSIS — Z6838 Body mass index (BMI) 38.0-38.9, adult: Secondary | ICD-10-CM

## 2019-12-23 DIAGNOSIS — Z20822 Contact with and (suspected) exposure to covid-19: Secondary | ICD-10-CM | POA: Diagnosis present

## 2019-12-23 DIAGNOSIS — E1122 Type 2 diabetes mellitus with diabetic chronic kidney disease: Secondary | ICD-10-CM | POA: Diagnosis present

## 2019-12-23 DIAGNOSIS — Z8546 Personal history of malignant neoplasm of prostate: Secondary | ICD-10-CM

## 2019-12-23 DIAGNOSIS — Y839 Surgical procedure, unspecified as the cause of abnormal reaction of the patient, or of later complication, without mention of misadventure at the time of the procedure: Secondary | ICD-10-CM | POA: Diagnosis present

## 2019-12-23 DIAGNOSIS — E785 Hyperlipidemia, unspecified: Secondary | ICD-10-CM | POA: Diagnosis present

## 2019-12-23 DIAGNOSIS — Z960 Presence of urogenital implants: Secondary | ICD-10-CM | POA: Diagnosis present

## 2019-12-23 DIAGNOSIS — S31109A Unspecified open wound of abdominal wall, unspecified quadrant without penetration into peritoneal cavity, initial encounter: Secondary | ICD-10-CM | POA: Diagnosis present

## 2019-12-23 DIAGNOSIS — B9561 Methicillin susceptible Staphylococcus aureus infection as the cause of diseases classified elsewhere: Secondary | ICD-10-CM | POA: Diagnosis present

## 2019-12-23 DIAGNOSIS — Z923 Personal history of irradiation: Secondary | ICD-10-CM

## 2019-12-23 DIAGNOSIS — R159 Full incontinence of feces: Secondary | ICD-10-CM | POA: Diagnosis present

## 2019-12-23 DIAGNOSIS — I2510 Atherosclerotic heart disease of native coronary artery without angina pectoris: Secondary | ICD-10-CM | POA: Diagnosis present

## 2019-12-23 DIAGNOSIS — Z87442 Personal history of urinary calculi: Secondary | ICD-10-CM

## 2019-12-23 DIAGNOSIS — N183 Chronic kidney disease, stage 3 unspecified: Secondary | ICD-10-CM | POA: Diagnosis present

## 2019-12-23 LAB — CBC WITH DIFFERENTIAL/PLATELET
Abs Immature Granulocytes: 0.16 10*3/uL — ABNORMAL HIGH (ref 0.00–0.07)
Basophils Absolute: 0 10*3/uL (ref 0.0–0.1)
Basophils Relative: 0 %
Eosinophils Absolute: 0 10*3/uL (ref 0.0–0.5)
Eosinophils Relative: 0 %
HCT: 33.1 % — ABNORMAL LOW (ref 39.0–52.0)
Hemoglobin: 10.1 g/dL — ABNORMAL LOW (ref 13.0–17.0)
Immature Granulocytes: 1 %
Lymphocytes Relative: 3 %
Lymphs Abs: 0.7 10*3/uL (ref 0.7–4.0)
MCH: 31.4 pg (ref 26.0–34.0)
MCHC: 30.5 g/dL (ref 30.0–36.0)
MCV: 102.8 fL — ABNORMAL HIGH (ref 80.0–100.0)
Monocytes Absolute: 1 10*3/uL (ref 0.1–1.0)
Monocytes Relative: 5 %
Neutro Abs: 18.9 10*3/uL — ABNORMAL HIGH (ref 1.7–7.7)
Neutrophils Relative %: 91 %
Platelets: 264 10*3/uL (ref 150–400)
RBC: 3.22 MIL/uL — ABNORMAL LOW (ref 4.22–5.81)
RDW: 15.2 % (ref 11.5–15.5)
WBC: 20.8 10*3/uL — ABNORMAL HIGH (ref 4.0–10.5)
nRBC: 0 % (ref 0.0–0.2)

## 2019-12-23 LAB — GLUCOSE, CAPILLARY
Glucose-Capillary: 143 mg/dL — ABNORMAL HIGH (ref 70–99)
Glucose-Capillary: 148 mg/dL — ABNORMAL HIGH (ref 70–99)

## 2019-12-23 LAB — URINALYSIS, ROUTINE W REFLEX MICROSCOPIC
Bilirubin Urine: NEGATIVE
Glucose, UA: NEGATIVE mg/dL
Ketones, ur: NEGATIVE mg/dL
Nitrite: NEGATIVE
Protein, ur: 100 mg/dL — AB
RBC / HPF: >50 RBC/hpf — ABNORMAL HIGH (ref 0–5)
Specific Gravity, Urine: 1.013 (ref 1.005–1.030)
WBC, UA: >50 WBC/hpf — ABNORMAL HIGH (ref 0–5)
pH: 6 (ref 5.0–8.0)

## 2019-12-23 LAB — PROTIME-INR
INR: 1.2 (ref 0.8–1.2)
Prothrombin Time: 15.2 s (ref 11.4–15.2)

## 2019-12-23 LAB — COMPREHENSIVE METABOLIC PANEL WITH GFR
ALT: 16 U/L (ref 0–44)
AST: 23 U/L (ref 15–41)
Albumin: 2.6 g/dL — ABNORMAL LOW (ref 3.5–5.0)
Alkaline Phosphatase: 105 U/L (ref 38–126)
Anion gap: 9 (ref 5–15)
BUN: 37 mg/dL — ABNORMAL HIGH (ref 8–23)
CO2: 19 mmol/L — ABNORMAL LOW (ref 22–32)
Calcium: 8.8 mg/dL — ABNORMAL LOW (ref 8.9–10.3)
Chloride: 109 mmol/L (ref 98–111)
Creatinine, Ser: 2.38 mg/dL — ABNORMAL HIGH (ref 0.61–1.24)
GFR calc Af Amer: 30 mL/min — ABNORMAL LOW
GFR calc non Af Amer: 26 mL/min — ABNORMAL LOW
Glucose, Bld: 162 mg/dL — ABNORMAL HIGH (ref 70–99)
Potassium: 4.3 mmol/L (ref 3.5–5.1)
Sodium: 137 mmol/L (ref 135–145)
Total Bilirubin: 0.7 mg/dL (ref 0.3–1.2)
Total Protein: 6.4 g/dL — ABNORMAL LOW (ref 6.5–8.1)

## 2019-12-23 LAB — LACTIC ACID, PLASMA
Lactic Acid, Venous: 2.4 mmol/L (ref 0.5–1.9)
Lactic Acid, Venous: 1.4 mmol/L (ref 0.5–1.9)

## 2019-12-23 LAB — SARS CORONAVIRUS 2 (TAT 6-24 HRS): SARS Coronavirus 2: NEGATIVE

## 2019-12-23 LAB — APTT: aPTT: 33 s (ref 24–36)

## 2019-12-23 MED ORDER — ZOLPIDEM TARTRATE 5 MG PO TABS
5.0000 mg | ORAL_TABLET | Freq: Every evening | ORAL | Status: DC | PRN
  Administered 2019-12-23 – 2019-12-25 (×2): 5 mg via ORAL
  Filled 2019-12-23 (×2): qty 1

## 2019-12-23 MED ORDER — ONDANSETRON HCL 4 MG/2ML IJ SOLN: 4.0000 mg | INTRAMUSCULAR | Status: DC | PRN

## 2019-12-23 MED ORDER — INSULIN ASPART 100 UNIT/ML ~~LOC~~ SOLN
0.0000 [IU] | SUBCUTANEOUS | Status: DC
  Administered 2019-12-23: 2 [IU] via SUBCUTANEOUS
  Administered 2019-12-24: 3 [IU] via SUBCUTANEOUS
  Administered 2019-12-24 – 2019-12-29 (×17): 2 [IU] via SUBCUTANEOUS
  Administered 2019-12-29: 3 [IU] via SUBCUTANEOUS
  Administered 2019-12-30 – 2020-01-01 (×4): 2 [IU] via SUBCUTANEOUS

## 2019-12-23 MED ORDER — SODIUM CHLORIDE 0.9 % IV BOLUS
1000.0000 mL | Freq: Once | INTRAVENOUS | Status: AC
  Administered 2019-12-23: 1000 mL via INTRAVENOUS

## 2019-12-23 MED ORDER — DIPHENHYDRAMINE HCL 12.5 MG/5ML PO ELIX: 12.5000 mg | ORAL_SOLUTION | Freq: Four times a day (QID) | ORAL | Status: DC | PRN

## 2019-12-23 MED ORDER — SODIUM CHLORIDE 0.9 % IV SOLN: INTRAVENOUS | Status: DC

## 2019-12-23 MED ORDER — SODIUM CHLORIDE 0.9 % IV SOLN
1.0000 g | INTRAVENOUS | Status: DC
  Administered 2019-12-23 – 2019-12-24 (×2): 1 g via INTRAVENOUS
  Filled 2019-12-23 (×2): qty 1

## 2019-12-23 MED ORDER — ENOXAPARIN SODIUM 40 MG/0.4ML ~~LOC~~ SOLN
40.0000 mg | SUBCUTANEOUS | Status: DC
  Administered 2019-12-23 – 2019-12-31 (×8): 40 mg via SUBCUTANEOUS
  Filled 2019-12-23 (×8): qty 0.4

## 2019-12-23 MED ORDER — DIPHENHYDRAMINE HCL 50 MG/ML IJ SOLN: 12.5000 mg | Freq: Four times a day (QID) | INTRAMUSCULAR | Status: DC | PRN

## 2019-12-23 MED ORDER — ACETAMINOPHEN 325 MG PO TABS
650.0000 mg | ORAL_TABLET | ORAL | Status: DC | PRN
  Administered 2019-12-25: 650 mg via ORAL
  Filled 2019-12-23: qty 2

## 2019-12-23 MED ORDER — HYDROCODONE-ACETAMINOPHEN 5-325 MG PO TABS
1.0000 | ORAL_TABLET | ORAL | Status: DC | PRN
  Administered 2019-12-24 – 2019-12-26 (×4): 1 via ORAL
  Administered 2019-12-27: 2 via ORAL
  Administered 2019-12-27 – 2019-12-28 (×2): 1 via ORAL
  Administered 2019-12-29 – 2020-01-01 (×9): 2 via ORAL
  Filled 2019-12-23 (×3): qty 2
  Filled 2019-12-23: qty 1
  Filled 2019-12-23 (×4): qty 2
  Filled 2019-12-23 (×2): qty 1
  Filled 2019-12-23 (×2): qty 2
  Filled 2019-12-23 (×2): qty 1
  Filled 2019-12-23: qty 2
  Filled 2019-12-23 (×2): qty 1

## 2019-12-23 NOTE — Progress Notes (Signed)
Wife [name] phoned and made aware of patient being transferred to room 1414.

## 2019-12-23 NOTE — H&P (Signed)
H&P  Chief Complaint: Weakness and drainage from abdominal incision  History of Present Illness:  Jimmy [name] is a 73 year old male with a significant past medical history of high risk prostate cancer status post EBRT and brachytherapy in 2009 with androgen deprivation therapy as well as more recent diagnosis of muscle invasive bladder cancer with right hydronephrosis but negative cross-sectional imaging for metastatic disease  status post neoadjuvant gemcitabine/cisplatin now status post robotic assisted laparoscopic converted open radical cystoprostatectomy with bilateral lymph node dissection and ileal conduit urinary diversion on December 10, 2019. Final pathology demonstrated [id number]. The patient presents the emergency department today after 2 days of weakness, frequent bowel movements, serous fluid draining from abdominal incision.  He is currently postop day 13 from his cystoprostatectomy and ileal conduit diversion.  He was discharged on December 18.  Since being home, he reports doing reasonably well but in the past 2 days has developed weakness.  Additionally, he has had multiple bowel movements over the past several days.  He also has clear thin fluid draining from his midline incision.  He reports drinking well with Ensure shakes and eating solid foods as well.  No episodes of emesis.  He has been walking reasonably well in the house but is now progressively more weak.  He denies any shortness of breath, chest pain, leg swelling.  He denies any flank pain or notable gross hematuria or purulence in his urine.  He has been doing reasonably well with urostomy appliance changes.  He denies any pain at this time and reports that his pain has been well controlled at home.   Past Medical History:  Diagnosis Date  . Astigmatism of both eyes 11/01/2016  . Bronchitis   . Coronary artery disease   . Cortical age-related cataract of both eyes 11/01/2016  . Diabetes mellitus without complication  ([place])    type 2  . Drug-induced osteoporosis   . Dyslipidemia   . Fatigue   . Fatty liver   . History of kidney stones   . Hypercholesteremia   . Hypogonadism male   . 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 Normal nuclear stress test March 2011  . Nuclear sclerotic cataract of both eyes 11/01/2016  . Obesity   . Prostate cancer [facility])    s/p seed implant and radiation  . Sleep apnea    90% of time dosent wear his cpap per pt  . Vitamin D insufficiency     Past Surgical History:  Procedure Laterality Date  . CARDIAC CATHETERIZATION  April 2000  . IR IMAGING GUIDED PORT INSERTION  07/30/2019  . RADIOACTIVE SEED IMPLANT  2009   Implanted radiation seeds  . thumb surgery     as a teen caught thumb in machine  . TRANSURETHRAL RESECTION OF BLADDER TUMOR N/A 06/22/2019   Procedure: TRANSURETHRAL RESECTION OF BLADDER TUMOR (TURBT), CYSTOSCOPY,  BILATERAL RETROGRADE PYELOGRAPHY, BILATERAL STENT PLACEMENT;  Surgeon: Raynelle Bring, MD;  Location: [facility];  Service: Urology;  Laterality: N/A;  GENERAL WITH PARALYSIS    Home Medications:  No current facility-administered medications on file prior to encounter.   Current Outpatient Medications on File Prior to Encounter  Medication Sig Dispense Refill  . enoxaparin (LOVENOX) 40 MG/0.4ML injection Inject 0.4 mLs (40 mg total) into the skin daily. 12 mL 0  . Lancets (ONETOUCH ULTRASOFT) lancets 1 each by Other route as needed. Use as  instructed 100 each 11  . losartan (COZAAR) 100 MG tablet Take 1 tablet (100 mg total) by mouth daily. 90 tablet 3  . metFORMIN (GLUCOPHAGE) 1000 MG tablet TAKE 1 TABLET BY MOUTH TWICE A DAY WITH MEALS (Patient taking differently: Take 1,000 mg by mouth daily with breakfast. ) 180 tablet 1  . ONE TOUCH ULTRA TEST test strip USE TO TEST BLOOD SUGAR DAILY AS DIRECTED BY DOCTOR 100 each 3  . Pitavastatin Calcium (LIVALO) 4 MG  TABS Take 1 tablet (4 mg total) by mouth daily. 90 tablet 3  . traMADol (ULTRAM) 50 MG tablet Take 1-2 tablets (50-100 mg total) by mouth every 6 (six) hours as needed for moderate pain. 25 tablet 0  . VICTOZA 18 MG/3ML SOPN INJECT 1.8 MG UNDER THE SKIN ONCE DAILY (Patient taking differently: Inject 1.8 mg into the skin daily. ) 27 pen 1  . ciprofloxacin (CIPRO) 500 MG tablet Take 500 mg by mouth 2 (two) times daily.    . Magnesium 250 MG TABS Take 1 tablet (250 mg total) by mouth daily. (Patient not taking: Reported on 12/23/2019) 14 tablet 0     Allergies:  Allergies  Allergen Reactions  . Ace Inhibitors Cough  . Cephalexin Hives, Diarrhea and Nausea Only    Did it involve swelling of the face/tongue/throat, SOB, or low BP? No Did it involve sudden or severe rash/hives, skin peeling, or any reaction on the inside of your mouth or nose? No Did you need to seek medical attention at a hospital or doctor's office? No When did it last happen?2019 If all above answers are "NO", may proceed with cephalosporin use.   . Crestor [Rosuvastatin Calcium]   . Lipitor [Atorvastatin Calcium] Other (See Comments)    MUSCLE PAINS   . Plavix [Clopidogrel Bisulfate] Hives    Family History  Problem Relation Age of Onset  . COPD Father     Social History:  reports that he quit smoking about 20 years ago. His smoking use included cigarettes. He has a 40.00 pack-year smoking history. He has never used smokeless tobacco. He reports current alcohol use of about 1.0 standard drinks of alcohol per week. He reports that he does not use drugs.  ROS: A complete review of systems was performed.  All systems are negative except for pertinent findings as noted.  Physical Exam:  Vital signs in last 24 hours: Temp:  [98.2 F (36.8 C)] 98.2 F (36.8 C) (12/23 1243) Pulse Rate:  [109-117] 117 (12/23 1530) Resp:  [18-24] 24 (12/23 1530) BP: (109-142)/(56-71) 142/71 (12/23 1530) SpO2:  [92 %-98 %] 97 %  (12/23 1530) Weight:  [115.4 kg] 115.4 kg (12/23 1246) Constitutional:  Alert and oriented, No acute distress but uncomfortable with mild shaking/tremors Cardiovascular: Tachycardic Respiratory: Normal respiratory effort on 2 L nasal cannula GI: Obese abdomen that is nontender, nondistended, without masses.  Midline incision with staples intact, no erythema, small amount of serous drainage.  Midline staples were removed at bedside and there was a small amount of straw-colored serous drainage.  Probed with Q-tip at bedside and there is a small pocket just infraumbilical.  Fascia does appear to be intact.  Supraumbilical portion covered with Steri-Strips.  Port incisions clean dry and intact and healing well.   Genitourinary: Edematous preputial skin.  Testicles descended bilaterally. Right lower quadrant ileal conduit pink and viable with 2 stents projecting and clear yellow urine with mild debris  Lymphatic: No lymphadenopathy Neurologic: Grossly intact, no focal deficits Psychiatric: Normal  mood and affect  Laboratory Data:  Recent Labs    12/23/19 1436  WBC 20.8*  HGB 10.1*  HCT 33.1*  PLT 264    Recent Labs    12/23/19 1436  NA 137  K 4.3  CL 109  GLUCOSE 162*  BUN 37*  CALCIUM 8.8*  CREATININE 2.38*     Results for orders placed or performed during the hospital encounter of 12/23/19 (from the past 24 hour(s))  Lactic acid, plasma     Status: Abnormal   Collection Time: 12/23/19  2:36 PM  Result Value Ref Range   Lactic Acid, Venous 2.4 (HH) 0.5 - 1.9 mmol/L  Comprehensive metabolic panel     Status: Abnormal   Collection Time: 12/23/19  2:36 PM  Result Value Ref Range   Sodium 137 135 - 145 mmol/L   Potassium 4.3 3.5 - 5.1 mmol/L   Chloride 109 98 - 111 mmol/L   CO2 19 (L) 22 - 32 mmol/L   Glucose, Bld 162 (H) 70 - 99 mg/dL   BUN 37 (H) 8 - 23 mg/dL   Creatinine, Ser 2.38 (H) 0.61 - 1.24 mg/dL   Calcium 8.8 (L) 8.9 - 10.3 mg/dL   Total Protein 6.4 (L) 6.5 - 8.1  g/dL   Albumin 2.6 (L) 3.5 - 5.0 g/dL   AST 23 15 - 41 U/L   ALT 16 0 - 44 U/L   Alkaline Phosphatase 105 38 - 126 U/L   Total Bilirubin 0.7 0.3 - 1.2 mg/dL   GFR calc non Af Amer 26 (L) >60 mL/min   GFR calc Af Amer 30 (L) >60 mL/min   Anion gap 9 5 - 15  CBC WITH DIFFERENTIAL     Status: Abnormal   Collection Time: 12/23/19  2:36 PM  Result Value Ref Range   WBC 20.8 (H) 4.0 - 10.5 K/uL   RBC 3.22 (L) 4.22 - 5.81 MIL/uL   Hemoglobin 10.1 (L) 13.0 - 17.0 g/dL   HCT 33.1 (L) 39.0 - 52.0 %   MCV 102.8 (H) 80.0 - 100.0 fL   MCH 31.4 26.0 - 34.0 pg   MCHC 30.5 30.0 - 36.0 g/dL   RDW 15.2 11.5 - 15.5 %   Platelets 264 150 - 400 K/uL   nRBC 0.0 0.0 - 0.2 %   Neutrophils Relative % 91 %   Neutro Abs 18.9 (H) 1.7 - 7.7 K/uL   Lymphocytes Relative 3 %   Lymphs Abs 0.7 0.7 - 4.0 K/uL   Monocytes Relative 5 %   Monocytes Absolute 1.0 0.1 - 1.0 K/uL   Eosinophils Relative 0 %   Eosinophils Absolute 0.0 0.0 - 0.5 K/uL   Basophils Relative 0 %   Basophils Absolute 0.0 0.0 - 0.1 K/uL   Immature Granulocytes 1 %   Abs Immature Granulocytes 0.16 (H) 0.00 - 0.07 K/uL  APTT     Status: None   Collection Time: 12/23/19  2:36 PM  Result Value Ref Range   aPTT 33 24 - 36 seconds  Protime-INR     Status: None   Collection Time: 12/23/19  2:36 PM  Result Value Ref Range   Prothrombin Time 15.2 11.4 - 15.2 seconds   INR 1.2 0.8 - 1.2  Urinalysis, Routine w reflex microscopic     Status: Abnormal   Collection Time: 12/23/19  2:36 PM  Result Value Ref Range   Color, Urine YELLOW YELLOW   APPearance CLOUDY (A) CLEAR   Specific Gravity, Urine 1.013  1.005 - 1.030   pH 6.0 5.0 - 8.0   Glucose, UA NEGATIVE NEGATIVE mg/dL   Hgb urine dipstick MODERATE (A) NEGATIVE   Bilirubin Urine NEGATIVE NEGATIVE   Ketones, ur NEGATIVE NEGATIVE mg/dL   Protein, ur 100 (A) NEGATIVE mg/dL   Nitrite NEGATIVE NEGATIVE   Leukocytes,Ua MODERATE (A) NEGATIVE   RBC / HPF >50 (H) 0 - 5 RBC/hpf   WBC, UA >50 (H) 0  - 5 WBC/hpf   Bacteria, UA MANY (A) NONE SEEN   Squamous Epithelial / LPF 0-5 0 - 5   WBC Clumps PRESENT    Mucus PRESENT    No results found for this or any previous visit (from the past 240 hour(s)).  Renal Function: Recent Labs    12/17/19 0543 12/23/19 1436  CREATININE 1.45* 2.38*   Estimated Creatinine Clearance: 34.4 mL/min (A) (by C-G formula based on SCr of 2.38 mg/dL (H)).  Radiologic Imaging: DG Chest Port 1 View  Result Date: 12/23/2019 CLINICAL DATA:  Code sepsis. Abdominal surgery ten days ago. History of prostate and bladder cancer. EXAM: PORTABLE CHEST 1 VIEW COMPARISON:  Radiographs 01/03/2018 and CT 11/02/2019. FINDINGS: 1316 hours. Right IJ Port-A-Cath extends to the superior cavoatrial junction. The heart size and mediastinal contours are stable. The lungs appear stable with emphysema and subpleural reticulation, especially at the apices. No superimposed airspace disease, pneumothorax or significant pleural effusion. The bones appear unchanged. IMPRESSION: 1. Stable postoperative chest. No acute cardiopulmonary process. 2. Stable emphysema and subpleural reticulation. Electronically Signed   By: Richardean Sale M.D.   On: 12/23/2019 13:37    Impression/Assessment:  73 year old male with history of diabetes, coronary artery disease, hypercholesterolemia, prostate cancer status post EBRT and seeds, muscle invasive bladder cancer status post radical cystoprostatectomy with ileal conduit urinary diversion on December 10, 2019 who presents with weakness, diarrhea, drainage from abdominal incision.  Overall, the patient sounds like he had been doing reasonably at home until 2 days ago.    MIBC s/p RC/IC: No further oncologic care during this admission.  Continue ileal conduit to drainage.  We will continue Bander stents given work-up for possible infection.  Hypoalbuminemia, aggressive dietary encouragement and supplements.  Continue daily Lovenox DVT prophylaxis in the 1  month perioperative period, per protocol.  Midline wound drainage: Midline incision appears to be a seroma, low clinical concern for infection based off of appearance.  Fascia is intact.  Swab sent for culture.  Will cover with antibiotics at this time.  Diarrhea: Several days of more frequent bowel movements.  Characterized as somewhat loose.  Will send C. difficile for analysis although overall low antibiotic exposure recently.  Weakness: Clinically appears to be dehydrated.  Tachycardic in the emergency department without hypotension.  Additionally, creatinine 2.4 from baseline 1.4.  WBC 20.8. Lactate 2.4. We will plan to admit overnight for observation, fluid hydration, optimization.  Will panculture at this time and start empiric antibiotics.  Differential includes urinary tract infection/pyelonephritis/wound infection/intra-abdominal process/pulmonary process   Plan:   Admit to urology, floor status.   Multimodal analgesia Wean oxygen Continue IV fluid hydration Morning labs Okay for diet at this time, no plans for surgical intervention Continue ileal conduit to gravity drainage, keep Bander stents in place Wet-to-dry dressings twice daily to midline wound with packing to superiormost aspect at the infraumbilical location Empiric antibiotics given concern for infection Follow-up blood culture, urine culture, wound culture Follow-up C. difficile stool test DVT prophylaxis Continue Lovenox qd Sliding scale insulin Out of bed  ambulate

## 2019-12-23 NOTE — ED Notes (Signed)
Attempted to call report to [facility], she directed me to [place], Therapist, sports.

## 2019-12-23 NOTE — ED Notes (Signed)
Report given to 6E RN 

## 2019-12-23 NOTE — ED Triage Notes (Signed)
Arrives via EMS from home, code sepsis, complains of abd sx on 12/13/2019, patient reports leaking 'urine' from blood. Hx of prostate and bladder CA.

## 2019-12-23 NOTE — ED Notes (Signed)
IV team at bedside 

## 2019-12-23 NOTE — ED Notes (Signed)
Portable xray at bedside.

## 2019-12-23 NOTE — ED Provider Notes (Signed)
[place] DEPT Provider Note   CSN: [id number] Arrival date & time: 12/23/19  1234     History Chief Complaint  Patient presents with  . leaking from incision    Jimmy [name] is a 73 y.o. male.  The history is provided by the patient and medical records. No language interpreter was used.   Jimmy [name] is a 73 y.o. male who presents to the Emergency Department complaining of wound drainage. Level V caveat due to confusion. History is provided by the patient and EMS. He presents to the emergency department from home for evaluation of linking from his abdominal wound. On December 10 he had a robotic assisted cysto prostatectomy with ileal conduit placement. He was discharged home on December 18. He states that he was doing well at home but developed leakage of fluids from the lower portion of his surgical site about five days ago. He states that it leaks every time he stands. She denies any fevers. He does have some mild shortness of breath. Denies any nausea, vomiting, diarrhea. He lives at home with his wife. Symptoms are moderate and constant nature.  Attempted to contact patient's wife upon his arrival to the emergency department but there was no answer.   Additional hx available after patients initial ED arrival.  Wife states that when he stands there is a large amount of clear fluid that drains from the bottom of his wound and penis when he stands.  She describes it as a large amount of fluid and a puddle of fluid.  He was initially doing well after leaving the hospital.    He is now weak, nauseous, fatigued since Monday.  He had associated vomiting, significant diarrhea today.   Past Medical History:  Diagnosis Date  . Astigmatism of both eyes 11/01/2016  . Bronchitis   . Coronary artery disease   . Cortical age-related cataract of both eyes 11/01/2016  . Diabetes mellitus without complication ([place])    type 2  . Drug-induced osteoporosis    . Dyslipidemia   . Fatigue   . Fatty liver   . History of kidney stones   . Hypercholesteremia   . Hypogonadism male   . 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 Normal nuclear stress test March 2011  . Nuclear sclerotic cataract of both eyes 11/01/2016  . Obesity   . Prostate cancer [facility])    s/p seed implant and radiation  . Sleep apnea    90% of time dosent wear his cpap per pt  . Vitamin D insufficiency     Patient Active Problem List   Diagnosis Date Noted  . Bladder cancer ([place]) 12/10/2019  . Port-A-Cath in place 08/07/2019  . Goals of care, counseling/discussion 07/24/2019  . Malignant neoplasm of urinary bladder ([place]) 07/24/2019  . Senile purpura ([place]) 06/03/2019  . Solitary pulmonary nodule present on computed tomography of lung 04/02/2018  . Type II diabetes mellitus with complication ([place]) [id number]  . Osteoporosis due to androgen therapy 09/16/2012  . Coronary artery disease involving native coronary artery of native heart without angina pectoris 08/29/2011  . Morbid obesity ([place]) 08/29/2011  . History of prostate cancer 08/29/2011  . Sleep apnea 08/29/2011  . Hypertension associated with type 2 diabetes mellitus ([place]) 08/29/2011  . Hyperlipidemia associated with type 2 diabetes mellitus ([place]) 08/29/2011    Past Surgical History:  Procedure  Laterality Date  . CARDIAC CATHETERIZATION  April 2000  . IR IMAGING GUIDED PORT INSERTION  07/30/2019  . RADIOACTIVE SEED IMPLANT  2009   Implanted radiation seeds  . thumb surgery     as a teen caught thumb in machine  . TRANSURETHRAL RESECTION OF BLADDER TUMOR N/A 06/22/2019   Procedure: TRANSURETHRAL RESECTION OF BLADDER TUMOR (TURBT), CYSTOSCOPY,  BILATERAL RETROGRADE PYELOGRAPHY, BILATERAL STENT PLACEMENT;  Surgeon: Raynelle Bring, MD;  Location: [facility] ORS;  Service: Urology;  Laterality: N/A;  GENERAL WITH PARALYSIS       Family  History  Problem Relation Age of Onset  . COPD Father     Social History   Tobacco Use  . Smoking status: Former Smoker    Packs/day: 1.00    Years: 40.00    Pack years: 40.00    Types: Cigarettes    Quit date: 08/20/1999    Years since quitting: 20.3  . Smokeless tobacco: Never Used  Substance Use Topics  . Alcohol use: Yes    Alcohol/week: 1.0 standard drinks    Types: 1 Shots of liquor per week    Comment: occasional  . Drug use: No    Home Medications Prior to Admission medications   Medication Sig Start Date End Date Taking? Authorizing Provider  enoxaparin (LOVENOX) 40 MG/0.4ML injection Inject 0.4 mLs (40 mg total) into the skin daily. 12/19/19 01/18/20 Yes Raynelle Bring, MD  Lancets [facility] ULTRASOFT) lancets 1 each by Other route as needed. Use as instructed 12/07/19  Yes [name], MD  losartan (COZAAR) 100 MG tablet Take 1 tablet (100 mg total) by mouth daily. 06/03/19  Yes [name], MD  metFORMIN (GLUCOPHAGE) 1000 MG tablet TAKE 1 TABLET BY MOUTH TWICE A DAY WITH MEALS Patient taking differently: Take 1,000 mg by mouth daily with breakfast.  12/01/19  Yes [name], MD  ONE TOUCH ULTRA TEST test strip USE TO TEST BLOOD SUGAR DAILY AS DIRECTED BY DOCTOR 12/01/18  Yes [name], MD  Pitavastatin Calcium (LIVALO) 4 MG TABS Take 1 tablet (4 mg total) by mouth daily. 06/03/19  Yes [name], MD  traMADol (ULTRAM) 50 MG tablet Take 1-2 tablets (50-100 mg total) by mouth every 6 (six) hours as needed for moderate pain. 12/18/19  Yes Raynelle Bring, MD  VICTOZA 18 MG/3ML SOPN INJECT 1.8 MG UNDER THE SKIN ONCE DAILY Patient taking differently: Inject 1.8 mg into the skin daily.  12/01/19  Yes [name], MD  ciprofloxacin (CIPRO) 500 MG tablet Take 500 mg by mouth 2 (two) times daily. 12/21/19   [provider]  Magnesium 250 MG TABS Take 1 tablet (250 mg total) by mouth daily. Patient not taking: Reported on 12/23/2019 09/25/19   Wyatt Portela, MD    Allergies    Ace inhibitors, Cephalexin, Crestor [rosuvastatin calcium], Lipitor [atorvastatin calcium], and Plavix [clopidogrel bisulfate]  Review of Systems   Review of Systems  All other systems reviewed and are negative.   Physical Exam Updated Vital Signs BP (!) 142/71   Pulse (!) 117   Temp 98.2 F (36.8 C) (Oral)   Resp (!) 24   Ht 5' 8.5" (1.74 m)   Wt 115.4 kg   SpO2 97%   BMI 38.12 kg/m   Physical Exam Vitals and nursing note reviewed.  Constitutional:      Appearance: He is well-developed.  HENT:     Head: Normocephalic and atraumatic.  Cardiovascular:  Rate and Rhythm: Regular rhythm. Tachycardia present.     Heart sounds: No murmur.  Pulmonary:     Effort: Pulmonary effort is normal. No respiratory distress.     Breath sounds: Normal breath sounds.  Abdominal:     Palpations: Abdomen is soft.     Tenderness: There is no rebound.     Comments: Ilio conduits in the right lower quadrant with yellow in sediment in the bag. Lower midline abdominal surgical site with staples intact with local ecchymosis. No significant erythema or edema. Appropriate mild abdominal tenderness postoperatively.  Musculoskeletal:        General: No tenderness.     Comments: Trace edema to bilateral lower extremities  Skin:    General: Skin is warm and dry.  Neurological:     Mental Status: He is alert and oriented to person, place, and time.  Psychiatric:        Behavior: Behavior normal.     ED Results / Procedures / Treatments   Labs (all labs ordered are listed, but only abnormal results are displayed) Labs Reviewed  LACTIC ACID, PLASMA - Abnormal; Notable for the following components:      Result Value   Lactic Acid, Venous 2.4 (*)    All other components within normal limits  COMPREHENSIVE METABOLIC PANEL - Abnormal; Notable for the following components:   CO2 19 (*)    Glucose, Bld 162 (*)    BUN 37 (*)    Creatinine, Ser 2.38 (*)    Calcium  8.8 (*)    Total Protein 6.4 (*)    Albumin 2.6 (*)    GFR calc non Af Amer 26 (*)    GFR calc Af Amer 30 (*)    All other components within normal limits  CBC WITH DIFFERENTIAL/PLATELET - Abnormal; Notable for the following components:   WBC 20.8 (*)    RBC 3.22 (*)    Hemoglobin 10.1 (*)    HCT 33.1 (*)    MCV 102.8 (*)    Neutro Abs 18.9 (*)    Abs Immature Granulocytes 0.16 (*)    All other components within normal limits  URINALYSIS, ROUTINE W REFLEX MICROSCOPIC - Abnormal; Notable for the following components:   APPearance CLOUDY (*)    Hgb urine dipstick MODERATE (*)    Protein, ur 100 (*)    Leukocytes,Ua MODERATE (*)    RBC / HPF >50 (*)    WBC, UA >50 (*)    Bacteria, UA MANY (*)    All other components within normal limits  CULTURE, BLOOD (ROUTINE X 2)  CULTURE, BLOOD (ROUTINE X 2)  URINE CULTURE  C DIFFICILE QUICK SCREEN W PCR REFLEX  AEROBIC/ANAEROBIC CULTURE (SURGICAL/DEEP WOUND)  APTT  PROTIME-INR  LACTIC ACID, PLASMA    EKG None  Radiology DG Chest Port 1 View  Result Date: 12/23/2019 CLINICAL DATA:  Code sepsis. Abdominal surgery ten days ago. History of prostate and bladder cancer. EXAM: PORTABLE CHEST 1 VIEW COMPARISON:  Radiographs 01/03/2018 and CT 11/02/2019. FINDINGS: 1316 hours. Right IJ Port-A-Cath extends to the superior cavoatrial junction. The heart size and mediastinal contours are stable. The lungs appear stable with emphysema and subpleural reticulation, especially at the apices. No superimposed airspace disease, pneumothorax or significant pleural effusion. The bones appear unchanged. IMPRESSION: 1. Stable postoperative chest. No acute cardiopulmonary process. 2. Stable emphysema and subpleural reticulation. Electronically Signed   By: Richardean Sale M.D.   On: 12/23/2019 13:37    Procedures Procedures (including  critical care time)  Medications Ordered in ED Medications  sodium chloride 0.9 % bolus 1,000 mL (1,000 mLs Intravenous  New Bag/Given 12/23/19 1456)  sodium chloride 0.9 % bolus 1,000 mL (1,000 mLs Intravenous New Bag/Given 12/23/19 1544)    ED Course  I have reviewed the triage vital signs and the nursing notes.  Pertinent labs & imaging results that were available during my care of the patient were reviewed by me and considered in my medical decision making (see chart for details).    MDM Rules/Calculators/A&P                      Patient here for evaluation of weakness and drainage from wound following cystoprostatectomy. He appears dehydrated on examination but non-toxic. There is no current evidence of infectious process. BMP and lactic acid are mildly elevated, favor that this is more related to dehydration over infection. He was treated with IV fluid hydration. Discussed with Dr. Alinda Money, who evaluated the patient in the emergency department. Plan to admit for further treatment.  Final Clinical Impression(s) / ED Diagnoses Final diagnoses:  Dehydration  AKI (acute kidney injury) ([place])  Abdominal wall seroma, initial encounter    Rx / DC Orders ED Discharge Orders    None       Quintella Reichert, MD 12/23/19 1611

## 2019-12-24 ENCOUNTER — Telehealth: Payer: Self-pay | Admitting: Family Medicine

## 2019-12-24 DIAGNOSIS — Z936 Other artificial openings of urinary tract status: Secondary | ICD-10-CM | POA: Diagnosis not present

## 2019-12-24 DIAGNOSIS — E7800 Pure hypercholesterolemia, unspecified: Secondary | ICD-10-CM | POA: Diagnosis present

## 2019-12-24 DIAGNOSIS — Z20822 Contact with and (suspected) exposure to covid-19: Secondary | ICD-10-CM | POA: Diagnosis present

## 2019-12-24 DIAGNOSIS — M2550 Pain in unspecified joint: Secondary | ICD-10-CM | POA: Diagnosis not present

## 2019-12-24 DIAGNOSIS — N12 Tubulo-interstitial nephritis, not specified as acute or chronic: Secondary | ICD-10-CM | POA: Diagnosis present

## 2019-12-24 DIAGNOSIS — I129 Hypertensive chronic kidney disease with stage 1 through stage 4 chronic kidney disease, or unspecified chronic kidney disease: Secondary | ICD-10-CM | POA: Diagnosis present

## 2019-12-24 DIAGNOSIS — E785 Hyperlipidemia, unspecified: Secondary | ICD-10-CM | POA: Diagnosis present

## 2019-12-24 DIAGNOSIS — Y839 Surgical procedure, unspecified as the cause of abnormal reaction of the patient, or of later complication, without mention of misadventure at the time of the procedure: Secondary | ICD-10-CM | POA: Diagnosis present

## 2019-12-24 DIAGNOSIS — Z9221 Personal history of antineoplastic chemotherapy: Secondary | ICD-10-CM | POA: Diagnosis not present

## 2019-12-24 DIAGNOSIS — E1122 Type 2 diabetes mellitus with diabetic chronic kidney disease: Secondary | ICD-10-CM | POA: Diagnosis present

## 2019-12-24 DIAGNOSIS — R7881 Bacteremia: Secondary | ICD-10-CM | POA: Diagnosis not present

## 2019-12-24 DIAGNOSIS — Z7401 Bed confinement status: Secondary | ICD-10-CM | POA: Diagnosis not present

## 2019-12-24 DIAGNOSIS — N390 Urinary tract infection, site not specified: Secondary | ICD-10-CM | POA: Diagnosis not present

## 2019-12-24 DIAGNOSIS — B9620 Unspecified Escherichia coli [E. coli] as the cause of diseases classified elsewhere: Secondary | ICD-10-CM | POA: Diagnosis not present

## 2019-12-24 DIAGNOSIS — M818 Other osteoporosis without current pathological fracture: Secondary | ICD-10-CM | POA: Diagnosis present

## 2019-12-24 DIAGNOSIS — L7634 Postprocedural seroma of skin and subcutaneous tissue following other procedure: Secondary | ICD-10-CM | POA: Diagnosis present

## 2019-12-24 DIAGNOSIS — Z8546 Personal history of malignant neoplasm of prostate: Secondary | ICD-10-CM | POA: Diagnosis not present

## 2019-12-24 DIAGNOSIS — B9561 Methicillin susceptible Staphylococcus aureus infection as the cause of diseases classified elsewhere: Secondary | ICD-10-CM | POA: Diagnosis present

## 2019-12-24 DIAGNOSIS — T8130XA Disruption of wound, unspecified, initial encounter: Secondary | ICD-10-CM | POA: Diagnosis not present

## 2019-12-24 DIAGNOSIS — Z888 Allergy status to other drugs, medicaments and biological substances status: Secondary | ICD-10-CM | POA: Diagnosis not present

## 2019-12-24 DIAGNOSIS — A0472 Enterocolitis due to Clostridium difficile, not specified as recurrent: Secondary | ICD-10-CM | POA: Diagnosis present

## 2019-12-24 DIAGNOSIS — I2510 Atherosclerotic heart disease of native coronary artery without angina pectoris: Secondary | ICD-10-CM | POA: Diagnosis present

## 2019-12-24 DIAGNOSIS — E860 Dehydration: Secondary | ICD-10-CM | POA: Diagnosis not present

## 2019-12-24 DIAGNOSIS — N1330 Unspecified hydronephrosis: Secondary | ICD-10-CM | POA: Diagnosis present

## 2019-12-24 DIAGNOSIS — C679 Malignant neoplasm of bladder, unspecified: Secondary | ICD-10-CM | POA: Diagnosis not present

## 2019-12-24 DIAGNOSIS — K760 Fatty (change of) liver, not elsewhere classified: Secondary | ICD-10-CM | POA: Diagnosis present

## 2019-12-24 DIAGNOSIS — E669 Obesity, unspecified: Secondary | ICD-10-CM | POA: Diagnosis present

## 2019-12-24 DIAGNOSIS — N179 Acute kidney failure, unspecified: Secondary | ICD-10-CM | POA: Diagnosis present

## 2019-12-24 DIAGNOSIS — R402411 Glasgow coma scale score 13-15, in the field [EMT or ambulance]: Secondary | ICD-10-CM | POA: Diagnosis not present

## 2019-12-24 DIAGNOSIS — Z923 Personal history of irradiation: Secondary | ICD-10-CM | POA: Diagnosis not present

## 2019-12-24 DIAGNOSIS — A4901 Methicillin susceptible Staphylococcus aureus infection, unspecified site: Secondary | ICD-10-CM | POA: Diagnosis not present

## 2019-12-24 DIAGNOSIS — N183 Chronic kidney disease, stage 3 unspecified: Secondary | ICD-10-CM | POA: Diagnosis present

## 2019-12-24 LAB — CBC
HCT: 30.4 % — ABNORMAL LOW (ref 39.0–52.0)
Hemoglobin: 9.3 g/dL — ABNORMAL LOW (ref 13.0–17.0)
MCH: 31 pg (ref 26.0–34.0)
MCHC: 30.6 g/dL (ref 30.0–36.0)
MCV: 101.3 fL — ABNORMAL HIGH (ref 80.0–100.0)
Platelets: 220 10*3/uL (ref 150–400)
RBC: 3 MIL/uL — ABNORMAL LOW (ref 4.22–5.81)
RDW: 14.9 % (ref 11.5–15.5)
WBC: 16.7 10*3/uL — ABNORMAL HIGH (ref 4.0–10.5)
nRBC: 0 % (ref 0.0–0.2)

## 2019-12-24 LAB — BASIC METABOLIC PANEL
Anion gap: 8 (ref 5–15)
BUN: 36 mg/dL — ABNORMAL HIGH (ref 8–23)
CO2: 15 mmol/L — ABNORMAL LOW (ref 22–32)
Calcium: 8 mg/dL — ABNORMAL LOW (ref 8.9–10.3)
Chloride: 114 mmol/L — ABNORMAL HIGH (ref 98–111)
Creatinine, Ser: 2.21 mg/dL — ABNORMAL HIGH (ref 0.61–1.24)
GFR calc Af Amer: 33 mL/min — ABNORMAL LOW (ref >60)
GFR calc non Af Amer: 28 mL/min — ABNORMAL LOW (ref >60)
Glucose, Bld: 140 mg/dL — ABNORMAL HIGH (ref 70–99)
Potassium: 4 mmol/L (ref 3.5–5.1)
Sodium: 137 mmol/L (ref 135–145)

## 2019-12-24 LAB — GLUCOSE, CAPILLARY
Glucose-Capillary: 140 mg/dL — ABNORMAL HIGH (ref 70–99)
Glucose-Capillary: 132 mg/dL — ABNORMAL HIGH (ref 70–99)
Glucose-Capillary: 161 mg/dL — ABNORMAL HIGH (ref 70–99)
Glucose-Capillary: 114 mg/dL — ABNORMAL HIGH (ref 70–99)
Glucose-Capillary: 131 mg/dL — ABNORMAL HIGH (ref 70–99)

## 2019-12-24 NOTE — Progress Notes (Signed)
PHARMACY - PHYSICIAN COMMUNICATION CRITICAL VALUE ALERT - BLOOD CULTURE IDENTIFICATION (BCID)  Jimmy [name] is an 73 y.o. male who presented to [facility] on 12/23/2019 with a chief complaint of weakness, drainage from abdominal incision  Assessment:  Urine? (include suspected source if known) 1 of 3 GPC - likely contaminant ?  Name of physician (or Provider) Contacted: Dr. Lovena Neighbours   Current antibiotics: Rocephin   Changes to prescribed antibiotics recommended:  Patient is on recommended antibiotics - No changes needed  No results found for this or any previous visit.  [name] 12/24/2019  10:17 PM

## 2019-12-24 NOTE — Progress Notes (Signed)
Patient ID: Jimmy [name], male   DOB: 1946/02/17, 73 y.o.   MRN: [id number]    Subjective: Pt feeling somewhat better.  Was confused overnight.  Objective: Vital signs in last 24 hours: Temp:  [98.2 F (36.8 C)-98.7 F (37.1 C)] 98.5 F (36.9 C) (12/24 0440) Pulse Rate:  [109-125] 113 (12/24 0440) Resp:  [18-28] 20 (12/24 0440) BP: (103-151)/(53-71) 109/58 (12/24 0440) SpO2:  [92 %-98 %] 94 % (12/24 0440) Weight:  [108.2 kg-115.4 kg] 108.2 kg (12/23 2155)  Intake/Output from previous day: 12/23 0701 - 12/24 0700 In: 2803.2 [I.V.:703.2; IV Piggyback:2100] Out: 600 [Urine:600] Intake/Output this shift: No intake/output data recorded.  Physical Exam:  General: Alert and oriented CV: Regular but still slightly tachycardic Lungs: Clear Abdomen: Soft, ND, ostomy pink, stents in place, urine clear, mild right CVAT Wound: Clean, packed Ext: NT, No erythema  Lab Results: Recent Labs    12/23/19 1436 12/24/19 0457  HGB 10.1* 9.3*  HCT 33.1* 30.4*   CBC Latest Ref Rng & Units 12/24/2019 12/23/2019 12/17/2019  WBC 4.0 - 10.5 K/uL 16.7(H) 20.8(H) -  Hemoglobin 13.0 - 17.0 g/dL 9.3(L) 10.1(L) 8.9(L)  Hematocrit 39.0 - 52.0 % 30.4(L) 33.1(L) 30.0(L)  Platelets 150 - 400 K/uL 220 264 -     BMET Recent Labs    12/23/19 1436 12/24/19 0457  NA 137 137  K 4.3 4.0  CL 109 114*  CO2 19* 15*  GLUCOSE 162* 140*  BUN 37* 36*  CREATININE 2.38* 2.21*  CALCIUM 8.8* 8.0*     Studies/Results:  Assessment/Plan: Dehydration/possible pyelonephritis s/p radical cystoprostatectomy and ileal conduit 12/10  1. ID: Blood, urine cultures pending.  C. Diff toxin ordered but no BM since yesterday now after presenting with explosive and chronic watery diarrhea.  WBC trending down today. Wound cultures pending but not suspicious for infection.  Continue ceftriaxone for pyelonephritis as most likely source.  If WBC trending down and clinically improved, can d/c home on 3rd generation  cephalosporin tomorrow.  Urine culture will hopefully be back tomorrow or Saturday. 2. Renal: Cr improved with hydration.  AKI likely secondary to dehydration.  Check in AM. 3. Wound care: Wound opened yesterday due to seroma.  Culture obtained but low suspicion of infection.  Continue wet to dry dressings BID at discharge.  I have put in case management consult.  Pt will need home health for wound care/dressing changes and ostomy care/teaching (this was supposedly set up before his last discharge but apparently was not). 4. Ambulate/PT.  Expect this to improve with treatment of infection.  He can resume home PT upon discharge. 5. DM: Continue SSI 6. Disp: He can be discharged once home health is arranged, he is ambulating, tolerating diet, and infection is controlled.  Hopefully tomorrow if doing well.   LOS: 0 days   [name] 12/24/2019, 12:41 PM

## 2019-12-24 NOTE — Telephone Encounter (Signed)
Pts wife called and stated that pt is in the hospital and his medication list is all messed up on my chart. So she wanted someone to call her on Monday or Tuesday so she can go over everything.

## 2019-12-24 NOTE — Evaluation (Signed)
Physical Therapy Evaluation Patient Details Name: Jimmy [name] MRN: [id number] DOB: 11-Nov-1946 Today's Date: 12/24/2019   History of Present Illness  Pt is 73 yo male with PMH including prostate CA, invasive bladder CA s/p laparoscopic converted open radical cystoprostatectomy with ileal conduit urinary diversion on 12/10/19.  Additionally with CAD, DM, obesity and see PMH for full H and P.  Pt presented with weakness, diarrhea, and drainage from abdominal incision. Admited with cdiff and AKI.  Clinical Impression  Pt admitted with above diagnosis. Pt was fatigued at time of PT evaluation but was able to transfer with min-mod A and take a few steps toward the [facility].  He had a posterior lean in sitting and standing but improved with cues.  Pt with supportive family and DME, expected to progress well and recommend home for discharge.   Pt currently with functional limitations due to the deficits listed below (see PT Problem List). Pt will benefit from skilled PT to increase their independence and safety with mobility to allow discharge to the venue listed below.       Follow Up Recommendations Home health PT;Supervision/Assistance - 24 hour(Pt and wife declining SNF; pt expected to progress well)    Equipment Recommendations  Other (comment)((wife reports can borrow a w/c if needed; has other DME))    Recommendations for Other Services       Precautions / Restrictions Precautions Precautions: Fall      Mobility  Bed Mobility Overal bed mobility: Needs Assistance Bed Mobility: Supine to Sit;Sit to Supine Rolling: Min assist   Supine to sit: Min assist Sit to supine: Min assist   General bed mobility comments: increased time; HOB elevated; used bed rails; min A to scoot forward and min A to get legs back in bed  Transfers Overall transfer level: Needs assistance Equipment used: Rolling walker (2 wheeled) Transfers: Sit to/from Stand Sit to Stand: Min assist;From elevated  surface         General transfer comment: verbal cues for safe technique, wide BOS  Ambulation/Gait Ambulation/Gait assistance: Min assist Gait Distance (Feet): 3 Feet Assistive device: Rolling walker (2 wheeled) Gait Pattern/deviations: Decreased stride length     General Gait Details: pt very fatigued; posterior lean; only able to take side steps to [place]            Wheelchair Mobility    Modified Rankin (Stroke Patients Only)       Balance Overall balance assessment: Needs assistance Sitting-balance support: Bilateral upper extremity supported;Feet supported Sitting balance-Leahy Scale: Poor Sitting balance - Comments: Initially with posterior lean requiring min A at times to correct; after standing was able to maintain sitting balance when returned to sitting Postural control: Posterior lean Standing balance support: Bilateral upper extremity supported Standing balance-Leahy Scale: Poor Standing balance comment: Pt wtih posterior lean requiring mod A initially and progressing to min A and min guard with cues to weight shift forward ; used RW                             Pertinent Vitals/Pain Pain Assessment: Faces Faces Pain Scale: Hurts little more Pain Location: Abdomen - with supine/sit Pain Descriptors / Indicators: Sore;Grimacing;Guarding Pain Intervention(s): Limited activity within patient's tolerance;Repositioned    Home Living Family/patient expects to be discharged to:: Private residence Living Arrangements: Spouse/significant other Available Help at Discharge: Family;Available 24 hours/day Type of Home: House Home Access: Stairs to enter Entrance Stairs-Rails: Right;Left;Can reach  both Entrance Stairs-Number of Steps: 3 Home Layout: One level Home Equipment: Walker - 2 wheels;Grab bars - tub/shower      Prior Function Level of Independence: Needs assistance   Gait / Transfers Assistance Needed: prior to recent  hospitalizations was completely independent; since surgery ealier this month walking in house with walker  ADL's / Homemaking Assistance Needed: Pt requiring min A since recent surgery; prior to that was independent        Hand Dominance        Extremity/Trunk Assessment   Upper Extremity Assessment Upper Extremity Assessment: Overall WFL for tasks assessed    Lower Extremity Assessment Lower Extremity Assessment: Generalized weakness    Cervical / Trunk Assessment Cervical / Trunk Assessment: Normal  Communication      Cognition Arousal/Alertness: Awake/alert Behavior During Therapy: WFL for tasks assessed/performed Overall Cognitive Status: Within Functional Limits for tasks assessed                                        General Comments General comments (skin integrity, edema, etc.): Pt and wife reports signficantly better than past couple days    Exercises     Assessment/Plan    PT Assessment Patient needs continued PT services  PT Problem List Decreased activity tolerance;Decreased balance;Decreased mobility;Decreased knowledge of use of DME;Obesity;Pain;Decreased strength;Decreased safety awareness       PT Treatment Interventions DME instruction;Gait training;Stair training;Functional mobility training;Therapeutic activities;Therapeutic exercise;Patient/family education;Balance training    PT Goals (Current goals can be found in the Care Plan section)  Acute Rehab PT Goals Patient Stated Goal: home at d/c PT Goal Formulation: With patient/family Time For Goal Achievement: 01/07/20 Potential to Achieve Goals: Good    Frequency Min 3X/week   Barriers to discharge        Co-evaluation               AM-PAC PT "6 Clicks" Mobility  Outcome Measure Help needed turning from your back to your side while in a flat bed without using bedrails?: A Little Help needed moving from lying on your back to sitting on the side of a flat bed  without using bedrails?: A Little Help needed moving to and from a bed to a chair (including a wheelchair)?: A Little Help needed standing up from a chair using your arms (e.g., wheelchair or bedside chair)?: A Little Help needed to walk in hospital room?: A Lot Help needed climbing 3-5 steps with a railing? : Total 6 Click Score: 15    End of Session Equipment Utilized During Treatment: Gait belt Activity Tolerance: Patient tolerated treatment well Patient left: in bed;with call bell/phone within reach;with family/visitor present Nurse Communication: Mobility status PT Visit Diagnosis: Difficulty in walking, not elsewhere classified (R26.2);Unsteadiness on feet (R26.81);Muscle weakness (generalized) (M62.81)    Time: 1510-1536 PT Time Calculation (min) (ACUTE ONLY): 26 min   Charges:   PT Evaluation $PT Eval Low Complexity: 1 Low          Maggie Font, PT Acute Rehab Services Pager [phone] [facility] [phone] [facility] [phone]   Karlton Lemon 12/24/2019, 4:08 PM

## 2019-12-25 DIAGNOSIS — C7911 Secondary malignant neoplasm of bladder: Secondary | ICD-10-CM

## 2019-12-25 DIAGNOSIS — Z923 Personal history of irradiation: Secondary | ICD-10-CM

## 2019-12-25 DIAGNOSIS — B9561 Methicillin susceptible Staphylococcus aureus infection as the cause of diseases classified elsewhere: Secondary | ICD-10-CM

## 2019-12-25 DIAGNOSIS — C61 Malignant neoplasm of prostate: Secondary | ICD-10-CM

## 2019-12-25 DIAGNOSIS — E1122 Type 2 diabetes mellitus with diabetic chronic kidney disease: Secondary | ICD-10-CM

## 2019-12-25 DIAGNOSIS — Z87891 Personal history of nicotine dependence: Secondary | ICD-10-CM

## 2019-12-25 DIAGNOSIS — N183 Chronic kidney disease, stage 3 unspecified: Secondary | ICD-10-CM

## 2019-12-25 DIAGNOSIS — R7881 Bacteremia: Principal | ICD-10-CM

## 2019-12-25 DIAGNOSIS — N390 Urinary tract infection, site not specified: Secondary | ICD-10-CM

## 2019-12-25 DIAGNOSIS — R197 Diarrhea, unspecified: Secondary | ICD-10-CM

## 2019-12-25 DIAGNOSIS — T8130XA Disruption of wound, unspecified, initial encounter: Secondary | ICD-10-CM

## 2019-12-25 LAB — BLOOD CULTURE ID PANEL (REFLEXED)

## 2019-12-25 LAB — CBC
HCT: 27.9 % — ABNORMAL LOW (ref 39.0–52.0)
Hemoglobin: 8.4 g/dL — ABNORMAL LOW (ref 13.0–17.0)
MCH: 30.8 pg (ref 26.0–34.0)
MCHC: 30.1 g/dL (ref 30.0–36.0)
MCV: 102.2 fL — ABNORMAL HIGH (ref 80.0–100.0)
Platelets: 245 10*3/uL (ref 150–400)
RBC: 2.73 MIL/uL — ABNORMAL LOW (ref 4.22–5.81)
RDW: 15.2 % (ref 11.5–15.5)
WBC: 6.3 10*3/uL (ref 4.0–10.5)
nRBC: 0 % (ref 0.0–0.2)

## 2019-12-25 LAB — BASIC METABOLIC PANEL
Anion gap: 9 (ref 5–15)
BUN: 32 mg/dL — ABNORMAL HIGH (ref 8–23)
CO2: 13 mmol/L — ABNORMAL LOW (ref 22–32)
Calcium: 7.8 mg/dL — ABNORMAL LOW (ref 8.9–10.3)
Chloride: 115 mmol/L — ABNORMAL HIGH (ref 98–111)
Creatinine, Ser: 1.69 mg/dL — ABNORMAL HIGH (ref 0.61–1.24)
GFR calc Af Amer: 46 mL/min — ABNORMAL LOW (ref >60)
GFR calc non Af Amer: 39 mL/min — ABNORMAL LOW (ref >60)
Glucose, Bld: 133 mg/dL — ABNORMAL HIGH (ref 70–99)
Potassium: 4 mmol/L (ref 3.5–5.1)
Sodium: 137 mmol/L (ref 135–145)

## 2019-12-25 LAB — C DIFFICILE QUICK SCREEN W PCR REFLEX
C Diff antigen: POSITIVE — AB
C Diff toxin: NEGATIVE

## 2019-12-25 LAB — GLUCOSE, CAPILLARY
Glucose-Capillary: 105 mg/dL — ABNORMAL HIGH (ref 70–99)
Glucose-Capillary: 115 mg/dL — ABNORMAL HIGH (ref 70–99)
Glucose-Capillary: 117 mg/dL — ABNORMAL HIGH (ref 70–99)
Glucose-Capillary: 120 mg/dL — ABNORMAL HIGH (ref 70–99)
Glucose-Capillary: 123 mg/dL — ABNORMAL HIGH (ref 70–99)
Glucose-Capillary: 123 mg/dL — ABNORMAL HIGH (ref 70–99)

## 2019-12-25 LAB — CLOSTRIDIUM DIFFICILE BY PCR, REFLEXED: Toxigenic C. Difficile by PCR: POSITIVE — AB

## 2019-12-25 MED ORDER — LOPERAMIDE HCL 2 MG PO CAPS
2.0000 mg | ORAL_CAPSULE | Freq: Two times a day (BID) | ORAL | Status: DC | PRN
  Administered 2019-12-25 – 2019-12-28 (×4): 2 mg via ORAL
  Filled 2019-12-25 (×4): qty 1

## 2019-12-25 MED ORDER — SODIUM CHLORIDE 0.9 % IV SOLN: 2.0000 g | Freq: Every day | INTRAVENOUS | Status: DC

## 2019-12-25 MED ORDER — CEFAZOLIN SODIUM-DEXTROSE 2-4 GM/100ML-% IV SOLN
2.0000 g | Freq: Three times a day (TID) | INTRAVENOUS | Status: DC
  Administered 2019-12-26 – 2020-01-01 (×20): 2 g via INTRAVENOUS
  Filled 2019-12-25 (×22): qty 100

## 2019-12-25 MED ORDER — SODIUM CHLORIDE 0.9 % IV SOLN
2.0000 g | Freq: Every day | INTRAVENOUS | Status: DC
  Administered 2019-12-25: 2 g via INTRAVENOUS
  Filled 2019-12-25: qty 20
  Filled 2019-12-25: qty 2

## 2019-12-25 NOTE — Progress Notes (Signed)
PHARMACY NOTE -  Ancef  Pharmacy has been assisting with dosing of Ancef for staph bacteremia.  Dosage remains stable at 2g IV q8 hr and need for further dosage adjustment appears unlikely at present given AKI resolving  Borderline CrCl for reduced dose, but with weight > 100 kg and AKI resolving will go with higher dosing regimen  Pharmacy will sign off, following peripherally for culture results or dose adjustments. Please reconsult if a change in clinical status warrants re-evaluation of dosage.  Reuel Boom, PharmD, BCPS [phone] 12/25/2019, 5:14 PM

## 2019-12-25 NOTE — Progress Notes (Addendum)
Ellison Hughs, MD paged regarding the pt's continuous diarrhea. Pt requesting med for relief.    MD returned page and gave verbal orders for Imodium 2 mg PO PRN BID for diarrhea.

## 2019-12-25 NOTE — Progress Notes (Signed)
Pt having mult. Loose stools. On-call MD paged.

## 2019-12-25 NOTE — Progress Notes (Signed)
Patient ID: Jimmy [name], male   DOB: 10/02/1946, 73 y.o.   MRN: [id number]  Culture results noted. Pt clinically improved per wife and Dr. Lovena Neighbours.  Staph aureus in two blood cultures.  GNRs in urine.  Likely may have pyelonephritis and separate bacteremia from other source (port-a-cath most likely source).  I have talked with IR with plans for port removal and picc line placement tomorrow.  He will not likely need chemotherapy in adjuvant setting s/p cystectomy.  Will hold Lovenox today and make NPO after MN.  Will also request input from ID regarding most appropriate antibiotic therapy/duration. Pt's wife updated on all the above.  Diarrhea resumed today after no stool for 24 hrs after admission.  C diff toxin had been cancelled per protocol but now reordered due to 3 loose, watery BMs this morning.

## 2019-12-25 NOTE — Consult Note (Signed)
[facility] for Infectious Disease    Date of Admission:  12/23/2019   Total days of antibiotics: 2 ceftriaxone               Reason for Consult: Staph aureus bacteremia    Referring Provider: Alinda Money   Assessment: Staph aureus bacteremia (MSSA suspected) UTI- > 100k GNR Wound dehisc Diarrhea (6-7 episodes today) Bladder Ca Prostate Ca DM2 CKD 3  Plan: 1. Change ceftriaxone to ancef 2. Repeat BCx 3. Needs TEE 4. Would strongly consider port removal. 5.  check C diff  Thank you so much for this interesting consult,  Active Problems:   Bladder cancer ([place])   . enoxaparin (LOVENOX) injection  40 mg Subcutaneous Q24H  . insulin aspart  0-15 Units Subcutaneous Q4H    HPI: Jimmy [name] is a 73 y.o. male with hx of DM2,  prostate CA (dx 2009), invasive bladder cancer (T3N0), and robot assisted prostatectomy with ileal conduit on 12-10-19 (d/c home on 12-18). Denies getting anbx after surgery.  He returned to ED on 12-23 with complaints of clear d/c from his wound site (increased with standing).  He had no fever. Since adm has been normotensive (106/64 lowest), some tachycardia, but remains afebrile. His WBC 20.8 --> 16.7.  Cr has improved.    He had port-a-cath placed 08-2019 for chemotherapy. Denies pain at port site.  He had previous TURP (06-2019) and seed implants (07-2019).  Review of Systems: Review of Systems  Constitutional: Positive for malaise/fatigue. Negative for chills and fever.  Gastrointestinal: Positive for diarrhea and nausea.  Please see HPI. All other systems reviewed and negative.   Past Medical History:  Diagnosis Date  . Astigmatism of both eyes 11/01/2016  . Bronchitis   . Coronary artery disease   . Cortical age-related cataract of both eyes 11/01/2016  . Diabetes mellitus without complication ([place])    type 2  . Drug-induced osteoporosis   . Dyslipidemia   . Fatigue   . Fatty liver   . History of kidney stones   .  Hypercholesteremia   . Hypogonadism male   . 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 Normal nuclear stress test March 2011  . Nuclear sclerotic cataract of both eyes 11/01/2016  . Obesity   . Prostate cancer [facility])    s/p seed implant and radiation  . Sleep apnea    90% of time dosent wear his cpap per pt  . Vitamin D insufficiency     Social History   Tobacco Use  . Smoking status: Former Smoker    Packs/day: 1.00    Years: 40.00    Pack years: 40.00    Types: Cigarettes    Quit date: 08/20/1999    Years since quitting: 20.3  . Smokeless tobacco: Never Used  Substance Use Topics  . Alcohol use: Yes    Alcohol/week: 1.0 standard drinks    Types: 1 Shots of liquor per week    Comment: occasional  . Drug use: No    Family History  Problem Relation Age of Onset  . COPD Father      Medications:  Scheduled: . enoxaparin (LOVENOX) injection  40 mg Subcutaneous Q24H  . insulin aspart  0-15 Units Subcutaneous Q4H    Abtx:  Anti-infectives (From admission, onward)   Start     Dose/Rate Route  Frequency Ordered Stop   12/26/19 0430  cefTRIAXone (ROCEPHIN) 2 g in sodium chloride 0.9 % 100 mL IVPB  Status:  Discontinued    Note to Pharmacy: Patient tolerated cefoxitin perioperatively without significant reaction despite allergy list   2 g 200 mL/hr over 30 Minutes Intravenous Daily 12/25/19 0343 12/25/19 0421   12/25/19 0430  cefTRIAXone (ROCEPHIN) 2 g in sodium chloride 0.9 % 100 mL IVPB    Note to Pharmacy: Patient tolerated cefoxitin perioperatively without significant reaction despite allergy list   2 g 200 mL/hr over 30 Minutes Intravenous Daily 12/25/19 0421     12/23/19 1900  cefTRIAXone (ROCEPHIN) 1 g in sodium chloride 0.9 % 100 mL IVPB  Status:  Discontinued    Note to Pharmacy: Patient tolerated cefoxitin perioperatively without significant reaction despite allergy list   1  g 200 mL/hr over 30 Minutes Intravenous Every 24 hours 12/23/19 1852 12/25/19 0343        OBJECTIVE: Blood pressure 136/70, pulse 96, temperature (!) 97.4 F (36.3 C), temperature source Oral, resp. rate 18, height 5\' 8"  (1.727 m), weight 108.2 kg, SpO2 97 %.  Physical Exam Constitutional:      Appearance: He is obese.  HENT:     Mouth/Throat:     Pharynx: Oropharynx is clear. No oropharyngeal exudate.  Eyes:     Extraocular Movements: Extraocular movements intact.     Pupils: Pupils are equal, round, and reactive to light.  Cardiovascular:     Rate and Rhythm: Normal rate and regular rhythm.  Pulmonary:     Effort: Pulmonary effort is normal.     Breath sounds: Normal breath sounds.  Chest:    Abdominal:     General: Bowel sounds are normal. There is distension.     Palpations: Abdomen is soft. There is no mass.     Tenderness: There is no abdominal tenderness. There is no guarding.    Musculoskeletal:     Cervical back: Normal range of motion and neck supple.     Right lower leg: No edema.     Left lower leg: No edema.  Neurological:     Mental Status: He is alert.     Lab Results Results for orders placed or performed during the hospital encounter of 12/23/19 (from the past 48 hour(s))  SARS CORONAVIRUS 2 (TAT 6-24 HRS) Nasopharyngeal Nasopharyngeal Swab     Status: None   Collection Time: 12/23/19  4:55 PM   Specimen: Nasopharyngeal Swab  Result Value Ref Range   SARS Coronavirus 2 NEGATIVE NEGATIVE    Comment: (NOTE) SARS-CoV-2 target nucleic acids are NOT DETECTED. The SARS-CoV-2 RNA is generally detectable in upper and lower respiratory specimens during the acute phase of infection. Negative results do not preclude SARS-CoV-2 infection, do not rule out co-infections with other pathogens, and should not be used as the sole basis for treatment or other patient management decisions. Negative results must be combined with clinical observations, patient  history, and epidemiological information. The expected result is Negative. Fact Sheet for Patients: SugarRoll.be Fact Sheet for Healthcare Providers: https://www.woods-mathews.com/ This test is not yet approved or cleared by the [place] FDA and  has been authorized for detection and/or diagnosis of SARS-CoV-2 by FDA under an Emergency Use Authorization (EUA). This EUA will remain  in effect (meaning this test can be used) for the duration of the COVID-19 declaration under Section 56 4(b)(1) of the Act, 21 U.S.C. section 360bbb-3(b)(1), unless the authorization is terminated or revoked sooner. Performed  at [facility], [place] [address], [place], [place] [address]   Blood Culture (routine x 2)     Status: Abnormal (Preliminary result)   Collection Time: 12/23/19  7:11 PM   Specimen: BLOOD  Result Value Ref Range   Specimen Description      BLOOD BLOOD RIGHT HAND Performed at [facility] [address] [place], [place] [address]    Special Requests      BOTTLES DRAWN AEROBIC ONLY Blood Culture adequate volume Performed at [facility] [address], [place], [place] [address]    Culture  Setup Time      AEROBIC BOTTLE ONLY GRAM POSITIVE COCCI CRITICAL RESULT CALLED TO, READ BACK BY AND VERIFIED WITH: B GREEN,PHARMD AT 0326 12/25/2019 BY L BENFIELD Performed at [facility], [place] [address] [place], [place] [address]    Culture STAPHYLOCOCCUS AUREUS (A)    Report Status PENDING   Blood Culture ID Panel (Reflexed)     Status: Abnormal   Collection Time: 12/23/19  7:11 PM  Result Value Ref Range   Enterococcus species NOT DETECTED NOT DETECTED   Listeria monocytogenes NOT DETECTED NOT DETECTED   Staphylococcus species DETECTED (A) NOT DETECTED    Comment: CRITICAL RESULT CALLED TO, READ BACK BY AND VERIFIED WITH: B GREEN,PHARMD AT 0325 12/25/2019 BY L BENFIELD    Staphylococcus  aureus (BCID) DETECTED (A) NOT DETECTED    Comment: Methicillin (oxacillin) susceptible Staphylococcus aureus (MSSA). Preferred therapy is anti staphylococcal beta lactam antibiotic (Cefazolin or Nafcillin), unless clinically contraindicated. CRITICAL RESULT CALLED TO, READ BACK BY AND VERIFIED WITH: B GREEN,PHARMD AT 0325 12/25/2019 BY L BENFIELD    Methicillin resistance NOT DETECTED NOT DETECTED   Streptococcus species NOT DETECTED NOT DETECTED   Streptococcus agalactiae NOT DETECTED NOT DETECTED   Streptococcus pneumoniae NOT DETECTED NOT DETECTED   Streptococcus pyogenes NOT DETECTED NOT DETECTED   Acinetobacter baumannii NOT DETECTED NOT DETECTED   Enterobacteriaceae species NOT DETECTED NOT DETECTED   Enterobacter cloacae complex NOT DETECTED NOT DETECTED   Escherichia coli NOT DETECTED NOT DETECTED   Klebsiella oxytoca NOT DETECTED NOT DETECTED   Klebsiella pneumoniae NOT DETECTED NOT DETECTED   Proteus species NOT DETECTED NOT DETECTED   Serratia marcescens NOT DETECTED NOT DETECTED   Haemophilus influenzae NOT DETECTED NOT DETECTED   Neisseria meningitidis NOT DETECTED NOT DETECTED   Pseudomonas aeruginosa NOT DETECTED NOT DETECTED   Candida albicans NOT DETECTED NOT DETECTED   Candida glabrata NOT DETECTED NOT DETECTED   Candida krusei NOT DETECTED NOT DETECTED   Candida parapsilosis NOT DETECTED NOT DETECTED   Candida tropicalis NOT DETECTED NOT DETECTED    Comment: Performed at [facility], [address] [place], [place] [address]  Glucose, capillary     Status: Abnormal   Collection Time: 12/23/19  8:16 PM  Result Value Ref Range   Glucose-Capillary 148 (H) 70 - 99 mg/dL   Comment 1 Notify RN    Comment 2 Document in Chart   Glucose, capillary     Status: Abnormal   Collection Time: 12/23/19 11:48 PM  Result Value Ref Range   Glucose-Capillary 143 (H) 70 - 99 mg/dL  Glucose, capillary     Status: Abnormal   Collection Time: 12/24/19  4:32 AM  Result Value  Ref Range   Glucose-Capillary 140 (H) 70 - 99 mg/dL  CBC     Status: Abnormal   Collection Time: 12/24/19  4:57 AM  Result Value Ref Range   WBC 16.7 (H) 4.0 - 10.5 K/uL   RBC 3.00 (L) 4.22 - 5.81 MIL/uL   Hemoglobin 9.3 (L) 13.0 - 17.0 g/dL   HCT 30.4 (L) 39.0 - 52.0 %   MCV 101.3 (H) 80.0 - 100.0 fL   MCH 31.0 26.0 - 34.0 pg   MCHC 30.6 30.0 - 36.0 g/dL   RDW 14.9 11.5 - 15.5 %   Platelets 220 150 - 400 K/uL   nRBC 0.0 0.0 - 0.2 %    Comment: Performed at [facility], [place] [address], [place], [place] [id number]  Basic metabolic panel     Status: Abnormal   Collection Time: 12/24/19  4:57 AM  Result Value Ref Range   Sodium 137 135 - 145 mmol/L   Potassium 4.0 3.5 - 5.1 mmol/L   Chloride 114 (H) 98 - 111 mmol/L   CO2 15 (L) 22 - 32 mmol/L   Glucose, Bld 140 (H) 70 - 99 mg/dL   BUN 36 (H) 8 - 23 mg/dL   Creatinine, Ser 2.21 (H) 0.61 - 1.24 mg/dL   Calcium 8.0 (L) 8.9 - 10.3 mg/dL   GFR calc non Af Amer 28 (L) >60 mL/min   GFR calc Af Amer 33 (L) >60 mL/min   Anion gap 8 5 - 15    Comment: Performed at [facility], [place] [address], [place], [place] [address]  Glucose, capillary     Status: Abnormal   Collection Time: 12/24/19  7:41 AM  Result Value Ref Range   Glucose-Capillary 132 (H) 70 - 99 mg/dL  Glucose, capillary     Status: Abnormal   Collection Time: 12/24/19 12:59 PM  Result Value Ref Range   Glucose-Capillary 161 (H) 70 - 99 mg/dL  Glucose, capillary     Status: Abnormal   Collection Time: 12/24/19  4:53 PM  Result Value Ref Range   Glucose-Capillary 114 (H) 70 - 99 mg/dL  Glucose, capillary     Status: Abnormal   Collection Time: 12/24/19  8:29 PM  Result Value Ref Range   Glucose-Capillary 131 (H) 70 - 99 mg/dL  Glucose, capillary     Status: Abnormal   Collection Time: 12/25/19 12:04 AM  Result Value Ref Range   Glucose-Capillary 105 (H) 70 - 99 mg/dL  Glucose, capillary     Status: Abnormal   Collection Time:  12/25/19  4:19 AM  Result Value Ref Range   Glucose-Capillary 117 (H) 70 - 99 mg/dL  Basic metabolic panel [id number] tomorrow     Status: Abnormal   Collection Time: 12/25/19  5:27 AM  Result Value Ref Range   Sodium 137 135 - 145 mmol/L   Potassium 4.0 3.5 - 5.1 mmol/L   Chloride 115 (H) 98 - 111 mmol/L   CO2 13 (L) 22 - 32 mmol/L   Glucose, Bld 133 (H) 70 - 99 mg/dL   BUN 32 (H) 8 - 23 mg/dL   Creatinine, Ser 1.69 (H) 0.61 - 1.24 mg/dL   Calcium 7.8 (L) 8.9 - 10.3 mg/dL   GFR calc non Af Amer 39 (L) >60 mL/min   GFR calc Af Amer 46 (L) >60 mL/min   Anion gap 9 5 - 15    Comment: Performed at [facility], [place] [address] [place],  [address]  Glucose, capillary     Status: Abnormal   Collection Time: 12/25/19  7:28 AM  Result Value Ref Range   Glucose-Capillary  123 (H) 70 - 99 mg/dL      Component Value Date/Time   SDES  12/23/2019 1911    BLOOD BLOOD RIGHT HAND Performed at [facility], [place] [address], [place], [place] [address]    SPECREQUEST  12/23/2019 1911    BOTTLES DRAWN AEROBIC ONLY Blood Culture adequate volume Performed at [facility], [place] [address] [place], [place] [address]    CULT STAPHYLOCOCCUS AUREUS (A) 12/23/2019 1911   REPTSTATUS PENDING 12/23/2019 1911   No results found. Recent Results (from the past 240 hour(s))  Urine culture     Status: Abnormal (Preliminary result)   Collection Time: 12/23/19  2:36 PM   Specimen: In/Out Cath Urine  Result Value Ref Range Status   Specimen Description   Final    IN/OUT CATH URINE Performed at [facility] [address], [place], [place] [address]    Special Requests   Final    NONE Performed at [facility], [place] [address], [place], [place] [address]    Culture (A)  Final    >=100,000 COLONIES/mL GRAM NEGATIVE RODS CULTURE REINCUBATED FOR BETTER GROWTH Performed at [facility], [place] [address]  [place], [place] [address]    Report Status PENDING  Incomplete  Blood Culture (routine x 2)     Status: Abnormal (Preliminary result)   Collection Time: 12/23/19  2:49 PM   Specimen: Left Antecubital; Blood  Result Value Ref Range Status   Specimen Description   Final    LEFT ANTECUBITAL Performed at [facility] [address], [place], [place] [address]    Special Requests   Final    BOTTLES DRAWN AEROBIC AND ANAEROBIC Blood Culture adequate volume Performed at [facility] [address], [place], [place] [address]    Culture  Setup Time   Final    AEROBIC BOTTLE ONLY GRAM POSITIVE COCCI CRITICAL RESULT CALLED TO, READ BACK BY AND VERIFIED WITH: B GREEN PHARMD 12/24/19 2215 JDW    Culture (A)  Final    STAPHYLOCOCCUS AUREUS SUSCEPTIBILITIES TO FOLLOW Performed at [facility], [address], [place], [place] [address]    Report Status PENDING  Incomplete  Aerobic/Anaerobic Culture (surgical/deep wound)     Status: None (Preliminary result)   Collection Time: 12/23/19  3:52 PM   Specimen: Wound  Result Value Ref Range Status   Specimen Description   Final    WOUND ABDOMEN Performed at [facility] [address], [place], [place] [address]    Special Requests   Final    NONE Performed at [facility], [place] [address], [place], [place] [address]    Gram Stain   Final    NO WBC SEEN NO ORGANISMS SEEN Performed at [facility], [place] [address], [place],  [address]    Culture   Final    FEW GRAM NEGATIVE RODS FEW STAPHYLOCOCCUS AUREUS SUSCEPTIBILITIES TO FOLLOW NO ANAEROBES ISOLATED; CULTURE IN PROGRESS FOR 5 DAYS    Report Status PENDING  Incomplete  SARS CORONAVIRUS 2 (TAT 6-24 HRS) Nasopharyngeal Nasopharyngeal Swab     Status: None   Collection Time: 12/23/19  4:55 PM   Specimen: Nasopharyngeal Swab  Result Value Ref Range Status   SARS Coronavirus 2 NEGATIVE NEGATIVE  Final    Comment: (NOTE) SARS-CoV-2 target nucleic acids are NOT DETECTED. The SARS-CoV-2 RNA is generally detectable in upper and lower respiratory  specimens during the acute phase of infection. Negative results do not preclude SARS-CoV-2 infection, do not rule out co-infections with other pathogens, and should not be used as the sole basis for treatment or other patient management decisions. Negative results must be combined with clinical observations, patient history, and epidemiological information. The expected result is Negative. Fact Sheet for Patients: SugarRoll.be Fact Sheet for Healthcare Providers: https://www.woods-mathews.com/ This test is not yet approved or cleared by the [place] FDA and  has been authorized for detection and/or diagnosis of SARS-CoV-2 by FDA under an Emergency Use Authorization (EUA). This EUA will remain  in effect (meaning this test can be used) for the duration of the COVID-19 declaration under Section 56 4(b)(1) of the Act, 21 U.S.C. section 360bbb-3(b)(1), unless the authorization is terminated or revoked sooner. Performed at [facility], [place] [address], [place], [place] [address]   Blood Culture (routine x 2)     Status: Abnormal (Preliminary result)   Collection Time: 12/23/19  7:11 PM   Specimen: BLOOD  Result Value Ref Range Status   Specimen Description   Final    BLOOD BLOOD RIGHT HAND Performed at [facility] [address] [place], [place] [address]    Special Requests   Final    BOTTLES DRAWN AEROBIC ONLY Blood Culture adequate volume Performed at [facility] [address], [place], [place] [address]    Culture  Setup Time   Final    AEROBIC BOTTLE ONLY GRAM POSITIVE COCCI CRITICAL RESULT CALLED TO, READ BACK BY AND VERIFIED WITH: B GREEN,PHARMD AT 0326 12/25/2019 BY L BENFIELD Performed at [facility], [place] [address]  [place], [place] [address]    Culture STAPHYLOCOCCUS AUREUS (A)  Final   Report Status PENDING  Incomplete  Blood Culture ID Panel (Reflexed)     Status: Abnormal   Collection Time: 12/23/19  7:11 PM  Result Value Ref Range Status   Enterococcus species NOT DETECTED NOT DETECTED Final   Listeria monocytogenes NOT DETECTED NOT DETECTED Final   Staphylococcus species DETECTED (A) NOT DETECTED Final    Comment: CRITICAL RESULT CALLED TO, READ BACK BY AND VERIFIED WITH: B GREEN,PHARMD AT WL:[id number] 12/25/2019 BY L BENFIELD    Staphylococcus aureus (BCID) DETECTED (A) NOT DETECTED Final    Comment: Methicillin (oxacillin) susceptible Staphylococcus aureus (MSSA). Preferred therapy is anti staphylococcal beta lactam antibiotic (Cefazolin or Nafcillin), unless clinically contraindicated. CRITICAL RESULT CALLED TO, READ BACK BY AND VERIFIED WITH: B GREEN,PHARMD AT 0325 12/25/2019 BY L BENFIELD    Methicillin resistance NOT DETECTED NOT DETECTED Final   Streptococcus species NOT DETECTED NOT DETECTED Final   Streptococcus agalactiae NOT DETECTED NOT DETECTED Final   Streptococcus pneumoniae NOT DETECTED NOT DETECTED Final   Streptococcus pyogenes NOT DETECTED NOT DETECTED Final   Acinetobacter baumannii NOT DETECTED NOT DETECTED Final   Enterobacteriaceae species NOT DETECTED NOT DETECTED Final   Enterobacter cloacae complex NOT DETECTED NOT DETECTED Final   Escherichia coli NOT DETECTED NOT DETECTED Final   Klebsiella oxytoca NOT DETECTED NOT DETECTED Final   Klebsiella pneumoniae NOT DETECTED NOT DETECTED Final   Proteus species NOT DETECTED NOT DETECTED Final   Serratia marcescens NOT DETECTED NOT DETECTED Final   Haemophilus influenzae NOT DETECTED NOT DETECTED Final   Neisseria meningitidis NOT DETECTED NOT DETECTED Final   Pseudomonas aeruginosa NOT DETECTED NOT DETECTED Final   Candida albicans NOT DETECTED NOT DETECTED Final   Candida glabrata NOT DETECTED NOT  DETECTED Final   Candida krusei  NOT DETECTED NOT DETECTED Final   Candida parapsilosis NOT DETECTED NOT DETECTED Final   Candida tropicalis NOT DETECTED NOT DETECTED Final    Comment: Performed at [facility], [place] [address], [place], [place] [address]    Microbiology: Recent Results (from the past 240 hour(s))  Urine culture     Status: Abnormal (Preliminary result)   Collection Time: 12/23/19  2:36 PM   Specimen: In/Out Cath Urine  Result Value Ref Range Status   Specimen Description   Final    IN/OUT CATH URINE Performed at [facility] [address] [place], [place] [address]    Special Requests   Final    NONE Performed at [facility], [place] [address] [place], [place] [address]    Culture (A)  Final    >=100,000 COLONIES/mL GRAM NEGATIVE RODS CULTURE REINCUBATED FOR BETTER GROWTH Performed at [facility], [place] [address] [place], [place] [address]    Report Status PENDING  Incomplete  Blood Culture (routine x 2)     Status: Abnormal (Preliminary result)   Collection Time: 12/23/19  2:49 PM   Specimen: Left Antecubital; Blood  Result Value Ref Range Status   Specimen Description   Final    LEFT ANTECUBITAL Performed at [facility] [address], [place], [place] [address]    Special Requests   Final    BOTTLES DRAWN AEROBIC AND ANAEROBIC Blood Culture adequate volume Performed at [facility] [address] [place], [place] [address]    Culture  Setup Time   Final    AEROBIC BOTTLE ONLY GRAM POSITIVE COCCI CRITICAL RESULT CALLED TO, READ BACK BY AND VERIFIED WITH: B GREEN PHARMD 12/24/19 2215 JDW    Culture (A)  Final    STAPHYLOCOCCUS AUREUS SUSCEPTIBILITIES TO FOLLOW Performed at [facility], [address] [place], [place] [address]    Report Status PENDING  Incomplete  Aerobic/Anaerobic Culture (surgical/deep wound)     Status: None (Preliminary result)   Collection Time: 12/23/19  3:52  PM   Specimen: Wound  Result Value Ref Range Status   Specimen Description   Final    WOUND ABDOMEN Performed at [facility] [address] [place], [place] [address]    Special Requests   Final    NONE Performed at [facility], [place] [address] [place], [place] [address]    Gram Stain   Final    NO WBC SEEN NO ORGANISMS SEEN Performed at [facility], [place] [address] [place], [place] [address]    Culture   Final    FEW GRAM NEGATIVE RODS FEW STAPHYLOCOCCUS AUREUS SUSCEPTIBILITIES TO FOLLOW NO ANAEROBES ISOLATED; CULTURE IN PROGRESS FOR 5 DAYS    Report Status PENDING  Incomplete  SARS CORONAVIRUS 2 (TAT 6-24 HRS) Nasopharyngeal Nasopharyngeal Swab     Status: None   Collection Time: 12/23/19  4:55 PM   Specimen: Nasopharyngeal Swab  Result Value Ref Range Status   SARS Coronavirus 2 NEGATIVE NEGATIVE Final    Comment: (NOTE) SARS-CoV-2 target nucleic acids are NOT DETECTED. The SARS-CoV-2 RNA is generally detectable in upper and lower respiratory specimens during the acute phase of infection. Negative results do not preclude SARS-CoV-2 infection, do not rule out co-infections with other pathogens, and should not be used as the sole basis for treatment or other patient management decisions. Negative results must  be combined with clinical observations, patient history, and epidemiological information. The expected result is Negative. Fact Sheet for Patients: SugarRoll.be Fact Sheet for Healthcare Providers: https://www.woods-mathews.com/ This test is not yet approved or cleared by the [place] FDA and  has been authorized for detection and/or diagnosis of SARS-CoV-2 by FDA under an Emergency Use Authorization (EUA). This EUA will remain  in effect (meaning this test can be used) for the duration of the COVID-19 declaration under Section 56 4(b)(1) of the Act, 21 U.S.C. section  360bbb-3(b)(1), unless the authorization is terminated or revoked sooner. Performed at [facility], [place] [address], [place], [place] [address]   Blood Culture (routine x 2)     Status: Abnormal (Preliminary result)   Collection Time: 12/23/19  7:11 PM   Specimen: BLOOD  Result Value Ref Range Status   Specimen Description   Final    BLOOD BLOOD RIGHT HAND Performed at [facility] [address] [place], [place] [address]    Special Requests   Final    BOTTLES DRAWN AEROBIC ONLY Blood Culture adequate volume Performed at [facility] [address] [place], [place] [address]    Culture  Setup Time   Final    AEROBIC BOTTLE ONLY GRAM POSITIVE COCCI CRITICAL RESULT CALLED TO, READ BACK BY AND VERIFIED WITH: B GREEN,PHARMD AT 0326 12/25/2019 BY L BENFIELD Performed at [facility], [place] [address] [place],  [address]    Culture STAPHYLOCOCCUS AUREUS (A)  Final   Report Status PENDING  Incomplete  Blood Culture ID Panel (Reflexed)     Status: Abnormal   Collection Time: 12/23/19  7:11 PM  Result Value Ref Range Status   Enterococcus species NOT DETECTED NOT DETECTED Final   Listeria monocytogenes NOT DETECTED NOT DETECTED Final   Staphylococcus species DETECTED (A) NOT DETECTED Final    Comment: CRITICAL RESULT CALLED TO, READ BACK BY AND VERIFIED WITH: B GREEN,PHARMD AT WL:[phone] 12/25/2019 BY L BENFIELD    Staphylococcus aureus (BCID) DETECTED (A) NOT DETECTED Final    Comment: Methicillin (oxacillin) susceptible Staphylococcus aureus (MSSA). Preferred therapy is anti staphylococcal beta lactam antibiotic (Cefazolin or Nafcillin), unless clinically contraindicated. CRITICAL RESULT CALLED TO, READ BACK BY AND VERIFIED WITH: B GREEN,PHARMD AT 0325 12/25/2019 BY L BENFIELD    Methicillin resistance NOT DETECTED NOT DETECTED Final   Streptococcus species NOT DETECTED NOT DETECTED Final   Streptococcus agalactiae NOT DETECTED  NOT DETECTED Final   Streptococcus pneumoniae NOT DETECTED NOT DETECTED Final   Streptococcus pyogenes NOT DETECTED NOT DETECTED Final   Acinetobacter baumannii NOT DETECTED NOT DETECTED Final   Enterobacteriaceae species NOT DETECTED NOT DETECTED Final   Enterobacter cloacae complex NOT DETECTED NOT DETECTED Final   Escherichia coli NOT DETECTED NOT DETECTED Final   Klebsiella oxytoca NOT DETECTED NOT DETECTED Final   Klebsiella pneumoniae NOT DETECTED NOT DETECTED Final   Proteus species NOT DETECTED NOT DETECTED Final   Serratia marcescens NOT DETECTED NOT DETECTED Final   Haemophilus influenzae NOT DETECTED NOT DETECTED Final   Neisseria meningitidis NOT DETECTED NOT DETECTED Final   Pseudomonas aeruginosa NOT DETECTED NOT DETECTED Final   Candida albicans NOT DETECTED NOT DETECTED Final   Candida glabrata NOT DETECTED NOT DETECTED Final   Candida krusei NOT DETECTED NOT DETECTED Final   Candida parapsilosis NOT DETECTED NOT DETECTED Final   Candida tropicalis NOT DETECTED NOT DETECTED Final    Comment: Performed at [facility], [address] [place], [place] [address]    Radiographs and labs were personally reviewed by me.   Bobby Rumpf, MD [facility] [place] [facility] [phone] 12/25/2019, 3:53 PM

## 2019-12-25 NOTE — Progress Notes (Addendum)
  Subjective: Loose bowel movements last night and this morning.  Feeling more fatigued this morning/early afternoon.  Denies nausea/vomiting and states he has a good appetite.  He continues to have some pain with ambulation and coughing, but not severe.  Objective: Vital signs in last 24 hours: Temp:  [97.9 F (36.6 C)-98.2 F (36.8 C)] 98.1 F (36.7 C) (12/25 0419) Pulse Rate:  [99-100] 100 (12/25 0419) Resp:  [18-22] 18 (12/25 0419) BP: (106-143)/(61-65) 131/65 (12/25 0419) SpO2:  [95 %-96 %] 96 % (12/25 0419)  Intake/Output from previous day: 12/24 0701 - 12/25 0700 In: 3885 [P.O.:300; I.V.:3295; IV Piggyback:290] Out: 2650 [Urine:2650]  Intake/Output this shift: Total I/O In: 619.8 [P.O.:120; I.V.:499.8] Out: 450 [Urine:450]  Physical Exam:  General: Alert and oriented CV: RRR, palpable distal pulses Lungs: CTAB, equal chest rise Abdomen: Soft, NTND, no rebound or guarding, urostomy is viable and draining amber urine Incisions: Midline wound incision is packed with wet-to-dry dressings with no signs of erythema or cellulitis involving the wound edges or wound bed.  The wound bed has good granulation tissue without fibrinous exudate. Ext: NT, No erythema  Lab Results: Recent Labs    12/23/19 1436 12/24/19 0457  HGB 10.1* 9.3*  HCT 33.1* 30.4*   BMET Recent Labs    12/24/19 0457 12/25/19 0527  NA 137 137  K 4.0 4.0  CL 114* 115*  CO2 15* 13*  GLUCOSE 140* 133*  BUN 36* 32*  CREATININE 2.21* 1.69*  CALCIUM 8.0* 7.8*     Studies/Results: DG Chest Port 1 View  Result Date: 12/23/2019 CLINICAL DATA:  Code sepsis. Abdominal surgery ten days ago. History of prostate and bladder cancer. EXAM: PORTABLE CHEST 1 VIEW COMPARISON:  Radiographs 01/03/2018 and CT 11/02/2019. FINDINGS: 1316 hours. Right IJ Port-A-Cath extends to the superior cavoatrial junction. The heart size and mediastinal contours are stable. The lungs appear stable with emphysema and subpleural  reticulation, especially at the apices. No superimposed airspace disease, pneumothorax or significant pleural effusion. The bones appear unchanged. IMPRESSION: 1. Stable postoperative chest. No acute cardiopulmonary process. 2. Stable emphysema and subpleural reticulation. Electronically Signed   By: Richardean Sale M.D.   On: 12/23/2019 13:37    Assessment/Plan: 1.  ID: Blood cultures are growing staph aureus and urine cultures are growing gram-negative rods.  No sensitivities available yet.  C. difficile toxin reordered due to multiple loose bowel movements over the past shift.  Continue IV Rocephin, dosage increased to 2 g.  Will consult IR to remove port and place PICC.   2.  Renal: Creatinine continues to improve with IV fluids.  I encouraged p.o. intake.  Continue IV fluid resuscitation  3.  Wound care: Continue wet-to-dry dressings twice daily.  No signs of wound infection on examination today.  -OOBTC and ambulate -continue SSI -will continues to monitor   LOS: 1 day   Ellison Hughs, MD [facility] Pager: [phone]  12/25/2019, 12:36 PM

## 2019-12-25 NOTE — Progress Notes (Signed)
Pt refused to ambulate multiple times today. Pt educated. Will try later per pt, feels too weak.

## 2019-12-25 NOTE — Progress Notes (Signed)
PHARMACY - PHYSICIAN COMMUNICATION CRITICAL VALUE ALERT - BLOOD CULTURE IDENTIFICATION (BCID)  OTHON PANDOLFO is an 73 y.o. male who presented to [facility] on 12/23/2019 with a chief complaint of weakness, drainage from abdominal incision  Assessment:  Urine? (include suspected source if known) 2 of 3 bottles [facility]- staph aureus Name of physician (or Provider) Contacted:   Current antibiotics: rocephin  Changes to prescribed antibiotics recommended:  Will increase rocephin to 2 Gm IV q24h for now Will page urology 0700 with results- consider narrowing to ancef?  Results for orders placed or performed during the hospital encounter of 12/23/19  Blood Culture ID Panel (Reflexed) (Collected: 12/23/2019  7:11 PM)  Result Value Ref Range   Enterococcus species NOT DETECTED NOT DETECTED   Listeria monocytogenes NOT DETECTED NOT DETECTED   Staphylococcus species DETECTED (A) NOT DETECTED   Staphylococcus aureus (BCID) DETECTED (A) NOT DETECTED   Methicillin resistance NOT DETECTED NOT DETECTED   Streptococcus species NOT DETECTED NOT DETECTED   Streptococcus agalactiae NOT DETECTED NOT DETECTED   Streptococcus pneumoniae NOT DETECTED NOT DETECTED   Streptococcus pyogenes NOT DETECTED NOT DETECTED   Acinetobacter baumannii NOT DETECTED NOT DETECTED   Enterobacteriaceae species NOT DETECTED NOT DETECTED   Enterobacter cloacae complex NOT DETECTED NOT DETECTED   Escherichia coli NOT DETECTED NOT DETECTED   Klebsiella oxytoca NOT DETECTED NOT DETECTED   Klebsiella pneumoniae NOT DETECTED NOT DETECTED   Proteus species NOT DETECTED NOT DETECTED   Serratia marcescens NOT DETECTED NOT DETECTED   Haemophilus influenzae NOT DETECTED NOT DETECTED   Neisseria meningitidis NOT DETECTED NOT DETECTED   Pseudomonas aeruginosa NOT DETECTED NOT DETECTED   Candida albicans NOT DETECTED NOT DETECTED   Candida glabrata NOT DETECTED NOT DETECTED   Candida krusei NOT DETECTED NOT DETECTED   Candida  parapsilosis NOT DETECTED NOT DETECTED   Candida tropicalis NOT DETECTED NOT DETECTED    [name] 12/25/2019  4:04 AM

## 2019-12-26 ENCOUNTER — Inpatient Hospital Stay (HOSPITAL_COMMUNITY): Payer: Medicare Other

## 2019-12-26 DIAGNOSIS — B9620 Unspecified Escherichia coli [E. coli] as the cause of diseases classified elsewhere: Secondary | ICD-10-CM

## 2019-12-26 DIAGNOSIS — A0472 Enterocolitis due to Clostridium difficile, not specified as recurrent: Secondary | ICD-10-CM

## 2019-12-26 DIAGNOSIS — Z95828 Presence of other vascular implants and grafts: Secondary | ICD-10-CM

## 2019-12-26 HISTORY — PX: IR REMOVAL TUN ACCESS W/ PORT W/O FL MOD SED: IMG2290

## 2019-12-26 LAB — CBC
HCT: 31.4 % — ABNORMAL LOW (ref 39.0–52.0)
Hemoglobin: 9.5 g/dL — ABNORMAL LOW (ref 13.0–17.0)
MCH: 30.8 pg (ref 26.0–34.0)
MCHC: 30.3 g/dL (ref 30.0–36.0)
MCV: 101.9 fL — ABNORMAL HIGH (ref 80.0–100.0)
Platelets: 287 10*3/uL (ref 150–400)
RBC: 3.08 MIL/uL — ABNORMAL LOW (ref 4.22–5.81)
RDW: 15.1 % (ref 11.5–15.5)
WBC: 5.5 10*3/uL (ref 4.0–10.5)
nRBC: 0 % (ref 0.0–0.2)

## 2019-12-26 LAB — CULTURE, BLOOD (ROUTINE X 2)
Special Requests: ADEQUATE
Special Requests: ADEQUATE

## 2019-12-26 LAB — BASIC METABOLIC PANEL
Anion gap: 10 (ref 5–15)
BUN: 27 mg/dL — ABNORMAL HIGH (ref 8–23)
CO2: 15 mmol/L — ABNORMAL LOW (ref 22–32)
Calcium: 8 mg/dL — ABNORMAL LOW (ref 8.9–10.3)
Chloride: 117 mmol/L — ABNORMAL HIGH (ref 98–111)
Creatinine, Ser: 1.44 mg/dL — ABNORMAL HIGH (ref 0.61–1.24)
GFR calc Af Amer: 55 mL/min — ABNORMAL LOW (ref >60)
GFR calc non Af Amer: 48 mL/min — ABNORMAL LOW (ref >60)
Glucose, Bld: 121 mg/dL — ABNORMAL HIGH (ref 70–99)
Potassium: 3.9 mmol/L (ref 3.5–5.1)
Sodium: 142 mmol/L (ref 135–145)

## 2019-12-26 LAB — GLUCOSE, CAPILLARY
Glucose-Capillary: 112 mg/dL — ABNORMAL HIGH (ref 70–99)
Glucose-Capillary: 113 mg/dL — ABNORMAL HIGH (ref 70–99)
Glucose-Capillary: 118 mg/dL — ABNORMAL HIGH (ref 70–99)
Glucose-Capillary: 122 mg/dL — ABNORMAL HIGH (ref 70–99)
Glucose-Capillary: 144 mg/dL — ABNORMAL HIGH (ref 70–99)
Glucose-Capillary: 97 mg/dL (ref 70–99)

## 2019-12-26 MED ORDER — LIDOCAINE-EPINEPHRINE 1 %-1:100000 IJ SOLN
INTRAMUSCULAR | Status: AC
  Filled 2019-12-26: qty 1

## 2019-12-26 MED ORDER — VANCOMYCIN 50 MG/ML ORAL SOLUTION
125.0000 mg | Freq: Four times a day (QID) | ORAL | Status: DC
  Administered 2019-12-26 – 2020-01-01 (×24): 125 mg via ORAL
  Filled 2019-12-26 (×28): qty 2.5

## 2019-12-26 MED ORDER — HYDROMORPHONE HCL 1 MG/ML IJ SOLN
1.0000 mg | INTRAMUSCULAR | Status: DC | PRN
  Administered 2019-12-26 – 2019-12-31 (×2): 1 mg via INTRAVENOUS
  Filled 2019-12-26 (×2): qty 1

## 2019-12-26 MED ORDER — VANCOMYCIN 50 MG/ML ORAL SOLUTION
125.0000 mg | Freq: Four times a day (QID) | ORAL | Status: DC
  Filled 2019-12-26 (×2): qty 2.5

## 2019-12-26 MED ORDER — LIDOCAINE-EPINEPHRINE (PF) 2 %-1:200000 IJ SOLN
INTRAMUSCULAR | Status: DC | PRN
  Administered 2019-12-26: 3 mL via INTRADERMAL

## 2019-12-26 MED ORDER — IOHEXOL 300 MG/ML  SOLN
50.0000 mL | Freq: Once | INTRAMUSCULAR | Status: AC | PRN
  Administered 2019-12-26: 5 mL via INTRAVENOUS

## 2019-12-26 MED ORDER — CHLORHEXIDINE GLUCONATE CLOTH 2 % EX PADS
6.0000 | MEDICATED_PAD | Freq: Every day | CUTANEOUS | Status: DC
  Administered 2019-12-26 – 2020-01-01 (×6): 6 via TOPICAL

## 2019-12-26 NOTE — Progress Notes (Signed)
INFECTIOUS DISEASE PROGRESS NOTE  ID: Jimmy [name] is a 73 y.o. male with  Active Problems:   Bladder cancer ([facility])  Subjective: Feels  Better Few BM today.   Abtx:  Anti-infectives (From admission, onward)   Start     Dose/Rate Route Frequency Ordered Stop   12/26/19 1200  vancomycin (VANCOCIN) 50 mg/mL oral solution 125 mg     125 mg Oral Every 6 hours 12/26/19 1129     12/26/19 1115  vancomycin (VANCOCIN) 50 mg/mL oral solution 125 mg  Status:  Discontinued     125 mg Oral 4 times daily 12/26/19 1102 12/26/19 1128   12/26/19 0500  ceFAZolin (ANCEF) IVPB 2g/100 mL premix     2 g 200 mL/hr over 30 Minutes Intravenous Every 8 hours 12/25/19 1724     12/26/19 0430  cefTRIAXone (ROCEPHIN) 2 g in sodium chloride 0.9 % 100 mL IVPB  Status:  Discontinued    Note to Pharmacy: Patient tolerated cefoxitin perioperatively without significant reaction despite allergy list   2 g 200 mL/hr over 30 Minutes Intravenous Daily 12/25/19 0343 12/25/19 0421   12/25/19 0430  cefTRIAXone (ROCEPHIN) 2 g in sodium chloride 0.9 % 100 mL IVPB  Status:  Discontinued    Note to Pharmacy: Patient tolerated cefoxitin perioperatively without significant reaction despite allergy list   2 g 200 mL/hr over 30 Minutes Intravenous Daily 12/25/19 0421 12/25/19 1621   12/23/19 1900  cefTRIAXone (ROCEPHIN) 1 g in sodium chloride 0.9 % 100 mL IVPB  Status:  Discontinued    Note to Pharmacy: Patient tolerated cefoxitin perioperatively without significant reaction despite allergy list   1 g 200 mL/hr over 30 Minutes Intravenous Every 24 hours 12/23/19 1852 12/25/19 0343      Medications:  Scheduled: . Chlorhexidine Gluconate Cloth  6 each Topical Daily  . enoxaparin (LOVENOX) injection  40 mg Subcutaneous Q24H  . insulin aspart  0-15 Units Subcutaneous Q4H  . lidocaine-EPINEPHrine      . vancomycin  125 mg Oral Q6H    Objective: Vital signs in last 24 hours: Temp:  [97.4 F (36.3 C)-98.5 F (36.9 C)]  97.6 F (36.4 C) (12/26 0611) Pulse Rate:  [95-96] 95 (12/26 0611) Resp:  [18] 18 (12/26 0611) BP: (136-154)/(70-78) 139/73 (12/26 0611) SpO2:  [94 %-97 %] 94 % (12/26 [id number])   General appearance: alert, cooperative, appears stated age, no distress and pale Resp: clear to auscultation bilaterally Chest wall: R chest port site is dressed, clean.  Cardio: regular rate and rhythm GI: normal findings: bowel sounds normal and soft, non-tender and lower abd wound is partially undressed, clean. no d/c. no tenderness.  Extremities: RUE [facility]  Lab Results Recent Labs    12/25/19 0527 12/25/19 1759 12/26/19 0552  WBC  --  6.3 5.5  HGB  --  8.4* 9.5*  HCT  --  27.9* 31.4*  NA 137  --  142  K 4.0  --  3.9  CL 115*  --  117*  CO2 13*  --  15*  BUN 32*  --  27*  CREATININE 1.69*  --  1.44*   Liver Panel Recent Labs    12/23/19 1436  PROT 6.4*  ALBUMIN 2.6*  AST 23  ALT 16  ALKPHOS 105  BILITOT 0.7   Sedimentation Rate No results for input(s): ESRSEDRATE in the last 72 hours. C-Reactive Protein No results for input(s): CRP in the last 72 hours.  Microbiology: Recent Results (from the past 240  hour(s))  Urine culture     Status: Abnormal (Preliminary result)   Collection Time: 12/23/19  2:36 PM   Specimen: In/Out Cath Urine  Result Value Ref Range Status   Specimen Description   Final    IN/OUT CATH URINE Performed at [facility] [address] [place], [place] [address]    Special Requests   Final    NONE Performed at [facility], [place] [address], [place], [place] [address]    Culture (A)  Final    >=100,000 COLONIES/mL GRAM NEGATIVE RODS CULTURE REINCUBATED FOR BETTER GROWTH Performed at [facility], [place] [address] [place], [place] [address]    Report Status PENDING  Incomplete  Blood Culture (routine x 2)     Status: Abnormal   Collection Time: 12/23/19  2:49 PM   Specimen: Left Antecubital; Blood  Result Value Ref  Range Status   Specimen Description   Final    LEFT ANTECUBITAL Performed at [facility] [address], [place], [place] [address]    Special Requests   Final    BOTTLES DRAWN AEROBIC AND ANAEROBIC Blood Culture adequate volume Performed at [facility] [address], [place], [place] [address]    Culture  Setup Time   Final    AEROBIC BOTTLE ONLY GRAM POSITIVE COCCI CRITICAL RESULT CALLED TO, READ BACK BY AND VERIFIED WITH: B GREEN PHARMD 12/24/19 2215 JDW Performed at [facility], [place] [address] [place], [place] [address]    Culture STAPHYLOCOCCUS AUREUS (A)  Final   Report Status 12/26/2019 FINAL  Final   Organism ID, Bacteria STAPHYLOCOCCUS AUREUS  Final      Susceptibility   Staphylococcus aureus - MIC*    CIPROFLOXACIN <=0.5 SENSITIVE Sensitive     ERYTHROMYCIN <=0.25 SENSITIVE Sensitive     GENTAMICIN <=0.5 SENSITIVE Sensitive     OXACILLIN <=0.25 SENSITIVE Sensitive     TETRACYCLINE <=1 SENSITIVE Sensitive     VANCOMYCIN <=0.5 SENSITIVE Sensitive     TRIMETH/SULFA <=10 SENSITIVE Sensitive     CLINDAMYCIN <=0.25 SENSITIVE Sensitive     RIFAMPIN <=0.5 SENSITIVE Sensitive     Inducible Clindamycin NEGATIVE Sensitive     * STAPHYLOCOCCUS AUREUS  Aerobic/Anaerobic Culture (surgical/deep wound)     Status: None (Preliminary result)   Collection Time: 12/23/19  3:52 PM   Specimen: Wound  Result Value Ref Range Status   Specimen Description   Final    WOUND ABDOMEN Performed at [facility] [address], [place], [place] [address]    Special Requests   Final    NONE Performed at [facility], [place] [address], [place], [place] [address]    Gram Stain   Final    NO WBC SEEN NO ORGANISMS SEEN Performed at [facility], [place] [address], [place], [place] [address]    Culture   Final    FEW ESCHERICHIA COLI FEW STAPHYLOCOCCUS AUREUS NO ANAEROBES ISOLATED; CULTURE IN PROGRESS FOR  5 DAYS    Report Status PENDING  Incomplete   Organism ID, Bacteria ESCHERICHIA COLI  Final   Organism ID, Bacteria STAPHYLOCOCCUS AUREUS  Final      Susceptibility   Escherichia coli - MIC*    AMPICILLIN >=32 RESISTANT Resistant     CEFAZOLIN <=4 SENSITIVE Sensitive     CEFEPIME <=1 SENSITIVE Sensitive     CEFTAZIDIME <=1 SENSITIVE Sensitive     CEFTRIAXONE <=1 SENSITIVE  Sensitive     CIPROFLOXACIN <=0.25 SENSITIVE Sensitive     GENTAMICIN <=1 SENSITIVE Sensitive     IMIPENEM <=0.25 SENSITIVE Sensitive     TRIMETH/SULFA <=20 SENSITIVE Sensitive     AMPICILLIN/SULBACTAM >=32 RESISTANT Resistant     PIP/TAZO <=4 SENSITIVE Sensitive     * FEW ESCHERICHIA COLI   Staphylococcus aureus - MIC*    CIPROFLOXACIN <=0.5 SENSITIVE Sensitive     ERYTHROMYCIN <=0.25 SENSITIVE Sensitive     GENTAMICIN <=0.5 SENSITIVE Sensitive     OXACILLIN <=0.25 SENSITIVE Sensitive     TETRACYCLINE <=1 SENSITIVE Sensitive     VANCOMYCIN <=0.5 SENSITIVE Sensitive     TRIMETH/SULFA <=10 SENSITIVE Sensitive     CLINDAMYCIN <=0.25 SENSITIVE Sensitive     RIFAMPIN <=0.5 SENSITIVE Sensitive     Inducible Clindamycin NEGATIVE Sensitive     * FEW STAPHYLOCOCCUS AUREUS  SARS CORONAVIRUS 2 (TAT 6-24 HRS) Nasopharyngeal Nasopharyngeal Swab     Status: None   Collection Time: 12/23/19  4:55 PM   Specimen: Nasopharyngeal Swab  Result Value Ref Range Status   SARS Coronavirus 2 NEGATIVE NEGATIVE Final    Comment: (NOTE) SARS-CoV-2 target nucleic acids are NOT DETECTED. The SARS-CoV-2 RNA is generally detectable in upper and lower respiratory specimens during the acute phase of infection. Negative results do not preclude SARS-CoV-2 infection, do not rule out co-infections with other pathogens, and should not be used as the sole basis for treatment or other patient management decisions. Negative results must be combined with clinical observations, patient history, and epidemiological information. The expected result  is Negative. Fact Sheet for Patients: SugarRoll.be Fact Sheet for Healthcare Providers: https://www.woods-mathews.com/ This test is not yet approved or cleared by the [place] FDA and  has been authorized for detection and/or diagnosis of SARS-CoV-2 by FDA under an Emergency Use Authorization (EUA). This EUA will remain  in effect (meaning this test can be used) for the duration of the COVID-19 declaration under Section 56 4(b)(1) of the Act, 21 U.S.C. section 360bbb-3(b)(1), unless the authorization is terminated or revoked sooner. Performed at [facility], [place] [address] [place], [place] [address]   Blood Culture (routine x 2)     Status: Abnormal   Collection Time: 12/23/19  7:11 PM   Specimen: BLOOD  Result Value Ref Range Status   Specimen Description   Final    BLOOD BLOOD RIGHT HAND Performed at [facility] [address], [place], [place] [address]    Special Requests   Final    BOTTLES DRAWN AEROBIC ONLY Blood Culture adequate volume Performed at [facility] [address], [place], [place] [address]    Culture  Setup Time   Final    AEROBIC BOTTLE ONLY GRAM POSITIVE COCCI CRITICAL RESULT CALLED TO, READ BACK BY AND VERIFIED WITH: B GREEN,PHARMD AT 0326 12/25/2019 BY L BENFIELD    Culture (A)  Final    STAPHYLOCOCCUS AUREUS SUSCEPTIBILITIES PERFORMED ON PREVIOUS CULTURE WITHIN THE LAST 5 DAYS. Performed at [facility], [place] [address] [place], [place] [address]    Report Status 12/26/2019 FINAL  Final  Blood Culture ID Panel (Reflexed)     Status: Abnormal   Collection Time: 12/23/19  7:11 PM  Result Value Ref Range Status   Enterococcus species NOT DETECTED NOT DETECTED Final   Listeria monocytogenes NOT DETECTED NOT DETECTED Final   Staphylococcus species DETECTED (A) NOT DETECTED Final    Comment: CRITICAL RESULT CALLED TO,  READ BACK BY AND VERIFIED WITH: B  GREEN,PHARMD AT [id number] 12/25/2019 BY L BENFIELD    Staphylococcus aureus (BCID) DETECTED (A) NOT DETECTED Final    Comment: Methicillin (oxacillin) susceptible Staphylococcus aureus (MSSA). Preferred therapy is anti staphylococcal beta lactam antibiotic (Cefazolin or Nafcillin), unless clinically contraindicated. CRITICAL RESULT CALLED TO, READ BACK BY AND VERIFIED WITH: B GREEN,PHARMD AT 0325 12/25/2019 BY L BENFIELD    Methicillin resistance NOT DETECTED NOT DETECTED Final   Streptococcus species NOT DETECTED NOT DETECTED Final   Streptococcus agalactiae NOT DETECTED NOT DETECTED Final   Streptococcus pneumoniae NOT DETECTED NOT DETECTED Final   Streptococcus pyogenes NOT DETECTED NOT DETECTED Final   Acinetobacter baumannii NOT DETECTED NOT DETECTED Final   Enterobacteriaceae species NOT DETECTED NOT DETECTED Final   Enterobacter cloacae complex NOT DETECTED NOT DETECTED Final   Escherichia coli NOT DETECTED NOT DETECTED Final   Klebsiella oxytoca NOT DETECTED NOT DETECTED Final   Klebsiella pneumoniae NOT DETECTED NOT DETECTED Final   Proteus species NOT DETECTED NOT DETECTED Final   Serratia marcescens NOT DETECTED NOT DETECTED Final   Haemophilus influenzae NOT DETECTED NOT DETECTED Final   Neisseria meningitidis NOT DETECTED NOT DETECTED Final   Pseudomonas aeruginosa NOT DETECTED NOT DETECTED Final   Candida albicans NOT DETECTED NOT DETECTED Final   Candida glabrata NOT DETECTED NOT DETECTED Final   Candida krusei NOT DETECTED NOT DETECTED Final   Candida parapsilosis NOT DETECTED NOT DETECTED Final   Candida tropicalis NOT DETECTED NOT DETECTED Final    Comment: Performed at [facility], [place], [place], [place] [address]  C difficile quick scan w PCR reflex     Status: Abnormal   Collection Time: 12/25/19  1:32 PM   Specimen: STOOL  Result Value Ref Range Status   C Diff antigen POSITIVE (A) NEGATIVE Final   C Diff toxin NEGATIVE NEGATIVE Final   C Diff  interpretation Results are indeterminate. See PCR results.  Final    Comment: Performed at [facility], [place] [address] [place], [place] [address]  C. Diff by PCR, Reflexed     Status: Abnormal   Collection Time: 12/25/19  1:32 PM  Result Value Ref Range Status   Toxigenic C. Difficile by PCR POSITIVE (A) NEGATIVE Final    Comment: Positive for toxigenic C. difficile with little to no toxin production. Only treat if clinical presentation suggests symptomatic illness. Performed at [facility], [place] [address] [place], [place] [address]   Culture, blood (routine x 2)     Status: None (Preliminary result)   Collection Time: 12/25/19  9:06 PM   Specimen: BLOOD  Result Value Ref Range Status   Specimen Description   Final    BLOOD RIGHT ANTECUBITAL Performed at [facility] [address] [place], [place] [address]    Special Requests   Final    BOTTLES DRAWN AEROBIC ONLY Blood Culture adequate volume Performed at [facility] [address] [place], [place] [address]    Culture   Final    NO GROWTH < 12 HOURS Performed at [facility] [address], [place], [place] [address]    Report Status PENDING  Incomplete  Culture, blood (routine x 2)     Status: None (Preliminary result)   Collection Time: 12/25/19  9:06 PM   Specimen: BLOOD RIGHT HAND  Result Value Ref Range Status   Specimen Description   Final  BLOOD RIGHT HAND Performed at [facility], [place] [address] [place], [place] [address]    Special Requests   Final    BOTTLES DRAWN AEROBIC ONLY Blood Culture adequate volume Performed at [facility] [address], [place], [place] [address]    Culture   Final    NO GROWTH < 12 HOURS Performed at [facility] [address] [place], [place] [address]    Report Status PENDING  Incomplete    Studies/Results: IR REMOVAL TUN ACCESS W/ PORT W/O FL MOD  SED  Result Date: 12/26/2019 INDICATION: 73 year old male with staphylococcal bacteremia and an indwelling right chest port catheter which may represent a source for infection. He presents for port catheter explantation and placement of a new PICC for IV antibiotic therapy. EXAM: REMOVAL RIGHT IJ VEIN PORT-A-CATH RIGHT UPPER EXTREMITY PICC PLACEMENT GREATER THAN 81 YEARS OLD MEDICATIONS: PATIENT IS CURRENTLY AN INPATIENT AND RECEIVING INTRAVENOUS ANTIBIOTICS. NO ADDITIONAL ANTIBIOTIC PROPHYLAXIS ADMINISTERED. ANESTHESIA/SEDATION: None FLUOROSCOPY TIME:  Fluoroscopy Time: 0 minutes 30 seconds (6 mGy). COMPLICATIONS: None immediate. PROCEDURE: Informed written consent was obtained from the patient after a thorough discussion of the procedural risks, benefits and alternatives. All questions were addressed. Maximal Sterile Barrier Technique was utilized including caps, mask, sterile gowns, sterile gloves, sterile drape, hand hygiene and skin antiseptic. A timeout was performed prior to the initiation of the procedure. The right chest was prepped and draped in a sterile fashion. Lidocaine was utilized for local anesthesia. An incision was made over the previously healed surgical incision. Utilizing blunt dissection, the port catheter and reservoir were removed from the underlying subcutaneous tissue in their entirety. Securing sutures were also removed. The pocket was irrigated with a copious amount of sterile normal saline. The pocket was closed with interrupted 3-0 Vicryl stitches. The subcutaneous tissue was closed with 3-0 Vicryl interrupted subcutaneous stitches. A 4-0 Vicryl running subcuticular stitch was utilized to approximate the skin. Dermabond was applied. The right upper extremity was then prepped and draped in standard fashion using chlorhexidine skin prep. A tourniquet was applied. The basilic vein was interrogated with ultrasound and found to be widely patent. An image was obtained and stored for the  medical record. Local anesthesia was attained by infiltration with 1% lidocaine. A small dermatotomy was made. Under real-time sonographic guidance, the vessel was punctured with a 21 gauge micropuncture needle. Using standard technique, the initial micro needle was exchanged over a 0.018 micro wire for a peel-away sheath. A dual lumen power injectable PICC line was then cut to 36 cm and advanced through the peel-away sheath. The catheter tip was positioned at the superior cavoatrial junction. The peel-away sheath was removed and discarded. The catheter was secured to the skin with an adhesive fixation device. The lumens were flushed with heparinized saline and capped. Images were obtained for the medical record document catheter tip position. The patient tolerated the procedure well. IMPRESSION: 1. Successful explantation of existing right chest port catheter. No evidence of purulence or infection in the reservoir pocket. Therefore, the incision was closed primarily. 2. Successful placement of a right upper extremity 36 cm dual lumen power PICC. The catheter tip is at the cavoatrial junction and the catheter is ready for immediate use. Electronically Signed   By: Jacqulynn Cadet M.D.   On: 12/26/2019 13:09   IR PICC PLACEMENT RIGHT >5 YRS INC IMG GUIDE  Result Date: 12/26/2019 INDICATION: 73 year old male with staphylococcal bacteremia and an indwelling right chest port catheter  which may represent a source for infection. He presents for port catheter explantation and placement of a new PICC for IV antibiotic therapy. EXAM: REMOVAL RIGHT IJ VEIN PORT-A-CATH RIGHT UPPER EXTREMITY PICC PLACEMENT GREATER THAN 74 YEARS OLD MEDICATIONS: PATIENT IS CURRENTLY AN INPATIENT AND RECEIVING INTRAVENOUS ANTIBIOTICS. NO ADDITIONAL ANTIBIOTIC PROPHYLAXIS ADMINISTERED. ANESTHESIA/SEDATION: None FLUOROSCOPY TIME:  Fluoroscopy Time: 0 minutes 30 seconds (6 mGy). COMPLICATIONS: None immediate. PROCEDURE: Informed written  consent was obtained from the patient after a thorough discussion of the procedural risks, benefits and alternatives. All questions were addressed. Maximal Sterile Barrier Technique was utilized including caps, mask, sterile gowns, sterile gloves, sterile drape, hand hygiene and skin antiseptic. A timeout was performed prior to the initiation of the procedure. The right chest was prepped and draped in a sterile fashion. Lidocaine was utilized for local anesthesia. An incision was made over the previously healed surgical incision. Utilizing blunt dissection, the port catheter and reservoir were removed from the underlying subcutaneous tissue in their entirety. Securing sutures were also removed. The pocket was irrigated with a copious amount of sterile normal saline. The pocket was closed with interrupted 3-0 Vicryl stitches. The subcutaneous tissue was closed with 3-0 Vicryl interrupted subcutaneous stitches. A 4-0 Vicryl running subcuticular stitch was utilized to approximate the skin. Dermabond was applied. The right upper extremity was then prepped and draped in standard fashion using chlorhexidine skin prep. A tourniquet was applied. The basilic vein was interrogated with ultrasound and found to be widely patent. An image was obtained and stored for the medical record. Local anesthesia was attained by infiltration with 1% lidocaine. A small dermatotomy was made. Under real-time sonographic guidance, the vessel was punctured with a 21 gauge micropuncture needle. Using standard technique, the initial micro needle was exchanged over a 0.018 micro wire for a peel-away sheath. A dual lumen power injectable PICC line was then cut to 36 cm and advanced through the peel-away sheath. The catheter tip was positioned at the superior cavoatrial junction. The peel-away sheath was removed and discarded. The catheter was secured to the skin with an adhesive fixation device. The lumens were flushed with heparinized saline and  capped. Images were obtained for the medical record document catheter tip position. The patient tolerated the procedure well. IMPRESSION: 1. Successful explantation of existing right chest port catheter. No evidence of purulence or infection in the reservoir pocket. Therefore, the incision was closed primarily. 2. Successful placement of a right upper extremity 36 cm dual lumen power PICC. The catheter tip is at the cavoatrial junction and the catheter is ready for immediate use. Electronically Signed   By: Jacqulynn Cadet M.D.   On: 12/26/2019 13:09     Assessment/Plan: Staph aureus bacteremia (MSSA suspected) UTI- > 100k GNR Wound dehisc  Cx MSSA and E coli C diff Bladder Ca Prostate Ca DM2 CKD 3  Total days of antibiotics: 3 ceftriaxone --> ancef    1 po vanco  await UCx BM better Repeat BCx 12-24 are ngtd TEE 12-28? Glc fairly well controlled.          Bobby Rumpf MD, FACP Infectious Diseases (pager) [phone] [email or web address] 12/26/2019, 1:14 PM  LOS: 2 days

## 2019-12-26 NOTE — Procedures (Signed)
Interventional Radiology Procedure Note  Procedure:  1.) Explantation right chest portacatheter 2.) Placement of a 36 cm DL PowerPICC via right basilic vein.   Complications: None  Estimated Blood Loss: None  Recommendations: - PICC ready for use - Routine line care  Signed,  Criselda Peaches, MD

## 2019-12-26 NOTE — Progress Notes (Signed)
Subjective: C diff positive. Feeling more fatigued this morning/early afternoon. Scheduled for port removal today  Objective: Vital signs in last 24 hours: Temp:  [97.6 F (36.4 C)-98.5 F (36.9 C)] 97.9 F (36.6 C) (12/26 1430) Pulse Rate:  [95-98] 98 (12/26 1430) Resp:  [18] 18 (12/26 1430) BP: (139-154)/(73-78) 147/73 (12/26 1430) SpO2:  [94 %-96 %] 96 % (12/26 1430)  Intake/Output from previous day: 12/25 0701 - 12/26 0700 In: 3633.5 [P.O.:720; I.V.:2813.5; IV Piggyback:100] Out: [id number] [Urine:3750; Stool:1]  Intake/Output this shift: No intake/output data recorded.  Physical Exam:  General: Alert and oriented CV: RRR, palpable distal pulses Lungs: CTAB, equal chest rise Abdomen: Soft, NTND, no rebound or guarding, urostomy is viable and draining amber urine Incisions: Midline wound incision is packed with wet-to-dry dressings with no signs of erythema or cellulitis involving the wound edges or wound bed.  The wound bed has good granulation tissue without fibrinous exudate. Ext: NT, No erythema  Lab Results: Recent Labs    12/24/19 0457 12/25/19 1759 12/26/19 0552  HGB 9.3* 8.4* 9.5*  HCT 30.4* 27.9* 31.4*   BMET Recent Labs    12/25/19 0527 12/26/19 0552  NA 137 142  K 4.0 3.9  CL 115* 117*  CO2 13* 15*  GLUCOSE 133* 121*  BUN 32* 27*  CREATININE 1.69* 1.44*  CALCIUM 7.8* 8.0*     Studies/Results: IR REMOVAL TUN ACCESS W/ PORT W/O FL MOD SED  Result Date: 12/26/2019 INDICATION: 73 year old male with staphylococcal bacteremia and an indwelling right chest port catheter which may represent a source for infection. He presents for port catheter explantation and placement of a new PICC for IV antibiotic therapy. EXAM: REMOVAL RIGHT IJ VEIN PORT-A-CATH RIGHT UPPER EXTREMITY PICC PLACEMENT GREATER THAN 54 YEARS OLD MEDICATIONS: PATIENT IS CURRENTLY AN INPATIENT AND RECEIVING INTRAVENOUS ANTIBIOTICS. NO ADDITIONAL ANTIBIOTIC PROPHYLAXIS ADMINISTERED.  ANESTHESIA/SEDATION: None FLUOROSCOPY TIME:  Fluoroscopy Time: 0 minutes 30 seconds (6 mGy). COMPLICATIONS: None immediate. PROCEDURE: Informed written consent was obtained from the patient after a thorough discussion of the procedural risks, benefits and alternatives. All questions were addressed. Maximal Sterile Barrier Technique was utilized including caps, mask, sterile gowns, sterile gloves, sterile drape, hand hygiene and skin antiseptic. A timeout was performed prior to the initiation of the procedure. The right chest was prepped and draped in a sterile fashion. Lidocaine was utilized for local anesthesia. An incision was made over the previously healed surgical incision. Utilizing blunt dissection, the port catheter and reservoir were removed from the underlying subcutaneous tissue in their entirety. Securing sutures were also removed. The pocket was irrigated with a copious amount of sterile normal saline. The pocket was closed with interrupted 3-0 Vicryl stitches. The subcutaneous tissue was closed with 3-0 Vicryl interrupted subcutaneous stitches. A 4-0 Vicryl running subcuticular stitch was utilized to approximate the skin. Dermabond was applied. The right upper extremity was then prepped and draped in standard fashion using chlorhexidine skin prep. A tourniquet was applied. The basilic vein was interrogated with ultrasound and found to be widely patent. An image was obtained and stored for the medical record. Local anesthesia was attained by infiltration with 1% lidocaine. A small dermatotomy was made. Under real-time sonographic guidance, the vessel was punctured with a 21 gauge micropuncture needle. Using standard technique, the initial micro needle was exchanged over a 0.018 micro wire for a peel-away sheath. A dual lumen power injectable PICC line was then cut to 36 cm and advanced through the peel-away sheath. The catheter tip was positioned at  the superior cavoatrial junction. The peel-away sheath  was removed and discarded. The catheter was secured to the skin with an adhesive fixation device. The lumens were flushed with heparinized saline and capped. Images were obtained for the medical record document catheter tip position. The patient tolerated the procedure well. IMPRESSION: 1. Successful explantation of existing right chest port catheter. No evidence of purulence or infection in the reservoir pocket. Therefore, the incision was closed primarily. 2. Successful placement of a right upper extremity 36 cm dual lumen power PICC. The catheter tip is at the cavoatrial junction and the catheter is ready for immediate use. Electronically Signed   By: Jacqulynn Cadet M.D.   On: 12/26/2019 13:09   IR PICC PLACEMENT RIGHT >5 YRS INC IMG GUIDE  Result Date: 12/26/2019 INDICATION: 73 year old male with staphylococcal bacteremia and an indwelling right chest port catheter which may represent a source for infection. He presents for port catheter explantation and placement of a new PICC for IV antibiotic therapy. EXAM: REMOVAL RIGHT IJ VEIN PORT-A-CATH RIGHT UPPER EXTREMITY PICC PLACEMENT GREATER THAN 70 YEARS OLD MEDICATIONS: PATIENT IS CURRENTLY AN INPATIENT AND RECEIVING INTRAVENOUS ANTIBIOTICS. NO ADDITIONAL ANTIBIOTIC PROPHYLAXIS ADMINISTERED. ANESTHESIA/SEDATION: None FLUOROSCOPY TIME:  Fluoroscopy Time: 0 minutes 30 seconds (6 mGy). COMPLICATIONS: None immediate. PROCEDURE: Informed written consent was obtained from the patient after a thorough discussion of the procedural risks, benefits and alternatives. All questions were addressed. Maximal Sterile Barrier Technique was utilized including caps, mask, sterile gowns, sterile gloves, sterile drape, hand hygiene and skin antiseptic. A timeout was performed prior to the initiation of the procedure. The right chest was prepped and draped in a sterile fashion. Lidocaine was utilized for local anesthesia. An incision was made over the previously healed surgical  incision. Utilizing blunt dissection, the port catheter and reservoir were removed from the underlying subcutaneous tissue in their entirety. Securing sutures were also removed. The pocket was irrigated with a copious amount of sterile normal saline. The pocket was closed with interrupted 3-0 Vicryl stitches. The subcutaneous tissue was closed with 3-0 Vicryl interrupted subcutaneous stitches. A 4-0 Vicryl running subcuticular stitch was utilized to approximate the skin. Dermabond was applied. The right upper extremity was then prepped and draped in standard fashion using chlorhexidine skin prep. A tourniquet was applied. The basilic vein was interrogated with ultrasound and found to be widely patent. An image was obtained and stored for the medical record. Local anesthesia was attained by infiltration with 1% lidocaine. A small dermatotomy was made. Under real-time sonographic guidance, the vessel was punctured with a 21 gauge micropuncture needle. Using standard technique, the initial micro needle was exchanged over a 0.018 micro wire for a peel-away sheath. A dual lumen power injectable PICC line was then cut to 36 cm and advanced through the peel-away sheath. The catheter tip was positioned at the superior cavoatrial junction. The peel-away sheath was removed and discarded. The catheter was secured to the skin with an adhesive fixation device. The lumens were flushed with heparinized saline and capped. Images were obtained for the medical record document catheter tip position. The patient tolerated the procedure well. IMPRESSION: 1. Successful explantation of existing right chest port catheter. No evidence of purulence or infection in the reservoir pocket. Therefore, the incision was closed primarily. 2. Successful placement of a right upper extremity 36 cm dual lumen power PICC. The catheter tip is at the cavoatrial junction and the catheter is ready for immediate use. Electronically Signed   By: Dellis Filbert.D.  On: 12/26/2019 13:09    Assessment/Plan: 1.  ID: Blood cultures are growing staph aureus and urine cultures are growing gram-negative rods.  No sensitivities available yet.  C. difficile toxin positive. We will start treatment.  Continue IV Rocephin, dosage increased to 2 g. Port removed today.   2.  Renal: Creatinine continues to improve with IV fluids.  I encouraged p.o. intake.  Continue IV fluid resuscitation  3.  Wound care: Continue wet-to-dry dressings twice daily.  No signs of wound infection on examination today.  -OOBTC and ambulate -continue SSI -will continues to monitor   LOS: 2 days   Ellison Hughs, MD [facility] Pager: [phone]  12/26/2019, 8:07 PM

## 2019-12-26 NOTE — Progress Notes (Signed)
Pharmacy Brief Note:  Patient currently on cefazolin for MSSA bacteremia and UTI. Pharmacy consulted to assist with management of newly diagnosed C.diff infection (PCR +, antigen +, pt symptomatic).   Vancomycin 125 mg PO q6h currently ordered for 10 day DOT. Will remove stop date as DOT will be guided by duration of ABX for bacteremia/UTI.   ID following, will defer management to ID. Pharmacy to sign off.   Lenis Noon, PharmD 12/26/19 11:26 AM

## 2019-12-27 DIAGNOSIS — L089 Local infection of the skin and subcutaneous tissue, unspecified: Secondary | ICD-10-CM

## 2019-12-27 DIAGNOSIS — C7982 Secondary malignant neoplasm of genital organs: Secondary | ICD-10-CM

## 2019-12-27 DIAGNOSIS — C679 Malignant neoplasm of bladder, unspecified: Secondary | ICD-10-CM

## 2019-12-27 LAB — GLUCOSE, CAPILLARY
Glucose-Capillary: 110 mg/dL — ABNORMAL HIGH (ref 70–99)
Glucose-Capillary: 122 mg/dL — ABNORMAL HIGH (ref 70–99)
Glucose-Capillary: 124 mg/dL — ABNORMAL HIGH (ref 70–99)
Glucose-Capillary: 125 mg/dL — ABNORMAL HIGH (ref 70–99)
Glucose-Capillary: 132 mg/dL — ABNORMAL HIGH (ref 70–99)
Glucose-Capillary: 135 mg/dL — ABNORMAL HIGH (ref 70–99)

## 2019-12-27 MED ORDER — SODIUM CHLORIDE 0.9% FLUSH
10.0000 mL | INTRAVENOUS | Status: DC | PRN
  Administered 2019-12-30: 10 mL

## 2019-12-27 NOTE — Progress Notes (Signed)
Physical Therapy Treatment Patient Details Name: Jimmy [name] MRN: [id number] DOB: 11-05-1946 Today's Date: 12/27/2019    History of Present Illness Pt is 73 yo male with PMH including prostate CA, invasive bladder CA s/p laparoscopic converted oped radical cystoprostatectomy with ileal conduit urinary diversion on 12/10/19.  Additionally with CAD, DM, obesity and see PMH for full H and P.  Pt presented with weakness, diarrhea, and drainage from abdominal incision. Admited with cdiff and AKI.    PT Comments    The patient was motivated to mobilize to recliner, required extra time, Use of RW(which was too narrow). Patient plans DC home. Patient may benefit from nonemergency transport as he has 3 STE. Continue  PT for progressive mobility.   Follow Up Recommendations  Home health PT;Supervision/Assistance - 24 hour     Equipment Recommendations  Other (comment)    Recommendations for Other Services       Precautions / Restrictions Precautions Precautions: Fall Precaution Comments: R UQ ileal conduit, diarrhea/c diff    Mobility  Bed Mobility   Bed Mobility: Supine to Sit     Supine to sit: Min guard;HOB elevated     General bed mobility comments: increased time; HOB elevated; used bed rails;  Transfers Overall transfer level: Needs assistance Equipment used: Rolling walker (2 wheeled) Transfers: Sit to/from [facility] Sit to Stand: Min assist;From elevated surface Stand pivot transfers: Min assist       General transfer comment: RW too narrow, some issues getting feet to move outside RW due to wide base, leaking  BM.  Ambulation/Gait                 Stairs             Wheelchair Mobility    Modified Rankin (Stroke Patients Only)       Balance   Sitting-balance support: Bilateral upper extremity supported;Feet supported Sitting balance-Leahy Scale: Fair     Standing balance support: Bilateral upper extremity  supported Standing balance-Leahy Scale: Poor Standing balance comment: reliant on UE and RW.                            Cognition Arousal/Alertness: Awake/alert Behavior During Therapy: WFL for tasks assessed/performed                                          Exercises      General Comments        Pertinent Vitals/Pain Faces Pain Scale: Hurts little more Pain Location: Abdomen - with supine/sit Pain Descriptors / Indicators: Sore;Grimacing;Guarding Pain Intervention(s): Monitored during session    Home Living                      Prior Function            PT Goals (current goals can now be found in the care plan section) Progress towards PT goals: Progressing toward goals    Frequency    Min 3X/week      PT Plan Current plan remains appropriate    Co-evaluation              AM-PAC PT "6 Clicks" Mobility   Outcome Measure  Help needed turning from your back to your side while in a flat bed without using bedrails?: A Little Help needed moving  from lying on your back to sitting on the side of a flat bed without using bedrails?: A Little Help needed moving to and from a bed to a chair (including a wheelchair)?: A Little Help needed standing up from a chair using your arms (e.g., wheelchair or bedside chair)?: A Little Help needed to walk in hospital room?: A Lot Help needed climbing 3-5 steps with a railing? : Total 6 Click Score: 15    End of Session Equipment Utilized During Treatment: Gait belt Activity Tolerance: Patient tolerated treatment well Patient left: in chair;with call bell/phone within reach;with chair alarm set;with nursing/sitter in room Nurse Communication: Mobility status PT Visit Diagnosis: Difficulty in walking, not elsewhere classified (R26.2);Unsteadiness on feet (R26.81);Muscle weakness (generalized) (M62.81)     Time: [id number] PT Time Calculation (min) (ACUTE ONLY): 38 min  Charges:   $Therapeutic Activity: 38-52 mins                     Tresa Endo PT Acute Rehabilitation Services Pager [phone] Office [phone]    Claretha Cooper 12/27/2019, 9:44 AM

## 2019-12-27 NOTE — Plan of Care (Signed)

## 2019-12-27 NOTE — Progress Notes (Signed)
Subjective: Patient feeling better today, less fatigue. No fevers. Port remove yesterday  Objective: Vital signs in last 24 hours: Temp:  [97.6 F (36.4 C)-98.3 F (36.8 C)] 97.6 F (36.4 C) (12/27 0453) Pulse Rate:  [87-98] 87 (12/27 0453) Resp:  [18] 18 (12/27 0453) BP: (141-147)/(73-85) 141/85 (12/27 0453) SpO2:  [96 %-97 %] 96 % (12/27 0453)  Intake/Output from previous day: 12/26 0701 - 12/27 0700 In: 2822.8 [P.O.:590; I.V.:1932.8; IV Piggyback:300] Out: 2401 [Urine:2400; Stool:1]  Intake/Output this shift: Total I/O In: -  Out: 1000 [Urine:1000]  Physical Exam:  General: Alert and oriented CV: RRR, palpable distal pulses Lungs: CTAB, equal chest rise Abdomen: Soft, NTND, no rebound or guarding, urostomy is viable and draining amber urine Incisions: Midline wound incision is packed with wet-to-dry dressings with no signs of erythema or cellulitis involving the wound edges or wound bed.  The wound bed has good granulation tissue without fibrinous exudate. Ext: NT, No erythema  Lab Results: Recent Labs    12/25/19 1759 12/26/19 0552  HGB 8.4* 9.5*  HCT 27.9* 31.4*   BMET Recent Labs    12/25/19 0527 12/26/19 0552  NA 137 142  K 4.0 3.9  CL 115* 117*  CO2 13* 15*  GLUCOSE 133* 121*  BUN 32* 27*  CREATININE 1.69* 1.44*  CALCIUM 7.8* 8.0*     Studies/Results: IR REMOVAL TUN ACCESS W/ PORT W/O FL MOD SED  Result Date: 12/26/2019 INDICATION: 72 year old male with staphylococcal bacteremia and an indwelling right chest port catheter which may represent a source for infection. He presents for port catheter explantation and placement of a new PICC for IV antibiotic therapy. EXAM: REMOVAL RIGHT IJ VEIN PORT-A-CATH RIGHT UPPER EXTREMITY PICC PLACEMENT GREATER THAN 20 YEARS OLD MEDICATIONS: PATIENT IS CURRENTLY AN INPATIENT AND RECEIVING INTRAVENOUS ANTIBIOTICS. NO ADDITIONAL ANTIBIOTIC PROPHYLAXIS ADMINISTERED. ANESTHESIA/SEDATION: None FLUOROSCOPY TIME:   Fluoroscopy Time: 0 minutes 30 seconds (6 mGy). COMPLICATIONS: None immediate. PROCEDURE: Informed written consent was obtained from the patient after a thorough discussion of the procedural risks, benefits and alternatives. All questions were addressed. Maximal Sterile Barrier Technique was utilized including caps, mask, sterile gowns, sterile gloves, sterile drape, hand hygiene and skin antiseptic. A timeout was performed prior to the initiation of the procedure. The right chest was prepped and draped in a sterile fashion. Lidocaine was utilized for local anesthesia. An incision was made over the previously healed surgical incision. Utilizing blunt dissection, the port catheter and reservoir were removed from the underlying subcutaneous tissue in their entirety. Securing sutures were also removed. The pocket was irrigated with a copious amount of sterile normal saline. The pocket was closed with interrupted 3-0 Vicryl stitches. The subcutaneous tissue was closed with 3-0 Vicryl interrupted subcutaneous stitches. A 4-0 Vicryl running subcuticular stitch was utilized to approximate the skin. Dermabond was applied. The right upper extremity was then prepped and draped in standard fashion using chlorhexidine skin prep. A tourniquet was applied. The basilic vein was interrogated with ultrasound and found to be widely patent. An image was obtained and stored for the medical record. Local anesthesia was attained by infiltration with 1% lidocaine. A small dermatotomy was made. Under real-time sonographic guidance, the vessel was punctured with a 21 gauge micropuncture needle. Using standard technique, the initial micro needle was exchanged over a 0.018 micro wire for a peel-away sheath. A dual lumen power injectable PICC line was then cut to 36 cm and advanced through the peel-away sheath. The catheter tip was positioned at the superior cavoatrial  junction. The peel-away sheath was removed and discarded. The catheter was  secured to the skin with an adhesive fixation device. The lumens were flushed with heparinized saline and capped. Images were obtained for the medical record document catheter tip position. The patient tolerated the procedure well. IMPRESSION: 1. Successful explantation of existing right chest port catheter. No evidence of purulence or infection in the reservoir pocket. Therefore, the incision was closed primarily. 2. Successful placement of a right upper extremity 36 cm dual lumen power PICC. The catheter tip is at the cavoatrial junction and the catheter is ready for immediate use. Electronically Signed   By: Jacqulynn Cadet M.D.   On: 12/26/2019 13:09   IR PICC PLACEMENT RIGHT >5 YRS INC IMG GUIDE  Result Date: 12/26/2019 INDICATION: 73 year old male with staphylococcal bacteremia and an indwelling right chest port catheter which may represent a source for infection. He presents for port catheter explantation and placement of a new PICC for IV antibiotic therapy. EXAM: REMOVAL RIGHT IJ VEIN PORT-A-CATH RIGHT UPPER EXTREMITY PICC PLACEMENT GREATER THAN 51 YEARS OLD MEDICATIONS: PATIENT IS CURRENTLY AN INPATIENT AND RECEIVING INTRAVENOUS ANTIBIOTICS. NO ADDITIONAL ANTIBIOTIC PROPHYLAXIS ADMINISTERED. ANESTHESIA/SEDATION: None FLUOROSCOPY TIME:  Fluoroscopy Time: 0 minutes 30 seconds (6 mGy). COMPLICATIONS: None immediate. PROCEDURE: Informed written consent was obtained from the patient after a thorough discussion of the procedural risks, benefits and alternatives. All questions were addressed. Maximal Sterile Barrier Technique was utilized including caps, mask, sterile gowns, sterile gloves, sterile drape, hand hygiene and skin antiseptic. A timeout was performed prior to the initiation of the procedure. The right chest was prepped and draped in a sterile fashion. Lidocaine was utilized for local anesthesia. An incision was made over the previously healed surgical incision. Utilizing blunt dissection, the  port catheter and reservoir were removed from the underlying subcutaneous tissue in their entirety. Securing sutures were also removed. The pocket was irrigated with a copious amount of sterile normal saline. The pocket was closed with interrupted 3-0 Vicryl stitches. The subcutaneous tissue was closed with 3-0 Vicryl interrupted subcutaneous stitches. A 4-0 Vicryl running subcuticular stitch was utilized to approximate the skin. Dermabond was applied. The right upper extremity was then prepped and draped in standard fashion using chlorhexidine skin prep. A tourniquet was applied. The basilic vein was interrogated with ultrasound and found to be widely patent. An image was obtained and stored for the medical record. Local anesthesia was attained by infiltration with 1% lidocaine. A small dermatotomy was made. Under real-time sonographic guidance, the vessel was punctured with a 21 gauge micropuncture needle. Using standard technique, the initial micro needle was exchanged over a 0.018 micro wire for a peel-away sheath. A dual lumen power injectable PICC line was then cut to 36 cm and advanced through the peel-away sheath. The catheter tip was positioned at the superior cavoatrial junction. The peel-away sheath was removed and discarded. The catheter was secured to the skin with an adhesive fixation device. The lumens were flushed with heparinized saline and capped. Images were obtained for the medical record document catheter tip position. The patient tolerated the procedure well. IMPRESSION: 1. Successful explantation of existing right chest port catheter. No evidence of purulence or infection in the reservoir pocket. Therefore, the incision was closed primarily. 2. Successful placement of a right upper extremity 36 cm dual lumen power PICC. The catheter tip is at the cavoatrial junction and the catheter is ready for immediate use. Electronically Signed   By: Jacqulynn Cadet M.D.   On: 12/26/2019  13:09     Assessment/Plan: 1.  ID: Blood cultures are growing staph aureus and urine cultures are growing gram-negative rods.  No sensitivities available yet.  C diff treatment started yesterday.  Continue IV ancef.Shanda Howells removed yesterday/ Echo scheduled for tomorrow  2.  Renal: Creatinine continues to improve with IV fluids.  I encouraged p.o. intake.  Continue IV fluid resuscitation  3.  Wound care: Continue wet-to-dry dressings twice daily.  No signs of wound infection on examination today.     LOS: 3 days   Ellison Hughs, MD [facility] Pager: [phone]  12/27/2019, 1:51 PM

## 2019-12-27 NOTE — Progress Notes (Signed)
INFECTIOUS DISEASE PROGRESS NOTE  ID: Jimmy [name] is a 73 y.o. male with  Active Problems:   Bladder cancer ([place])  Subjective: No complaints, normal BM today.   Abtx:  Anti-infectives (From admission, onward)   Start     Dose/Rate Route Frequency Ordered Stop   12/26/19 1200  vancomycin (VANCOCIN) 50 mg/mL oral solution 125 mg     125 mg Oral Every 6 hours 12/26/19 1129     12/26/19 1115  vancomycin (VANCOCIN) 50 mg/mL oral solution 125 mg  Status:  Discontinued     125 mg Oral 4 times daily 12/26/19 1102 12/26/19 1128   12/26/19 0500  ceFAZolin (ANCEF) IVPB 2g/100 mL premix     2 g 200 mL/hr over 30 Minutes Intravenous Every 8 hours 12/25/19 1724     12/26/19 0430  cefTRIAXone (ROCEPHIN) 2 g in sodium chloride 0.9 % 100 mL IVPB  Status:  Discontinued    Note to Pharmacy: Patient tolerated cefoxitin perioperatively without significant reaction despite allergy list   2 g 200 mL/hr over 30 Minutes Intravenous Daily 12/25/19 0343 12/25/19 0421   12/25/19 0430  cefTRIAXone (ROCEPHIN) 2 g in sodium chloride 0.9 % 100 mL IVPB  Status:  Discontinued    Note to Pharmacy: Patient tolerated cefoxitin perioperatively without significant reaction despite allergy list   2 g 200 mL/hr over 30 Minutes Intravenous Daily 12/25/19 0421 12/25/19 1621   12/23/19 1900  cefTRIAXone (ROCEPHIN) 1 g in sodium chloride 0.9 % 100 mL IVPB  Status:  Discontinued    Note to Pharmacy: Patient tolerated cefoxitin perioperatively without significant reaction despite allergy list   1 g 200 mL/hr over 30 Minutes Intravenous Every 24 hours 12/23/19 1852 12/25/19 0343      Medications:  Scheduled: . Chlorhexidine Gluconate Cloth  6 each Topical Daily  . enoxaparin (LOVENOX) injection  40 mg Subcutaneous Q24H  . insulin aspart  0-15 Units Subcutaneous Q4H  . vancomycin  125 mg Oral Q6H    Objective: Vital signs in last 24 hours: Temp:  [97.6 F (36.4 C)-98.3 F (36.8 C)] 97.6 F (36.4 C) (12/27  0453) Pulse Rate:  [87-98] 87 (12/27 0453) Resp:  [18] 18 (12/27 0453) BP: (141-147)/(73-85) 141/85 (12/27 0453) SpO2:  [96 %-97 %] 96 % (12/27 0453)   General appearance: alert, cooperative and no distress Resp: clear to auscultation bilaterally Chest wall: no tenderness, port site dressed, clean.  Cardio: regular rate and rhythm GI: normal findings: soft, non-tender and abnormal findings:  distended and tympany. bs+  Lab Results Recent Labs    12/25/19 0527 12/25/19 1759 12/26/19 0552  WBC  --  6.3 5.5  HGB  --  8.4* 9.5*  HCT  --  27.9* 31.4*  NA 137  --  142  K 4.0  --  3.9  CL 115*  --  117*  CO2 13*  --  15*  BUN 32*  --  27*  CREATININE 1.69*  --  1.44*   Liver Panel No results for input(s): PROT, ALBUMIN, AST, ALT, ALKPHOS, BILITOT, BILIDIR, IBILI in the last 72 hours. Sedimentation Rate No results for input(s): ESRSEDRATE in the last 72 hours. C-Reactive Protein No results for input(s): CRP in the last 72 hours.  Microbiology: Recent Results (from the past 240 hour(s))  Urine culture     Status: Abnormal (Preliminary result)   Collection Time: 12/23/19  2:36 PM   Specimen: In/Out Cath Urine  Result Value Ref Range Status   Specimen  Description   Final    IN/OUT CATH URINE Performed at [facility] [address], [place], [place] [address]    Special Requests   Final    NONE Performed at [facility], [place] [address], [place], [place] [address]    Culture (A)  Final    >=100,000 COLONIES/mL PANTOEA SPECIES CULTURE REINCUBATED FOR BETTER GROWTH >=100,000 COLONIES/mL STAPHYLOCOCCUS AUREUS    Report Status PENDING  Incomplete   Organism ID, Bacteria STAPHYLOCOCCUS AUREUS (A)  Final      Susceptibility   Staphylococcus aureus - MIC*    CIPROFLOXACIN <=0.5 SENSITIVE Sensitive     GENTAMICIN <=0.5 SENSITIVE Sensitive     NITROFURANTOIN <=16 SENSITIVE Sensitive     OXACILLIN <=0.25 SENSITIVE Sensitive      TETRACYCLINE <=1 SENSITIVE Sensitive     VANCOMYCIN 1 SENSITIVE Sensitive     TRIMETH/SULFA <=10 SENSITIVE Sensitive     CLINDAMYCIN <=0.25 SENSITIVE Sensitive     RIFAMPIN <=0.5 SENSITIVE Sensitive     Inducible Clindamycin NEGATIVE Sensitive     * >=100,000 COLONIES/mL STAPHYLOCOCCUS AUREUS  Blood Culture (routine x 2)     Status: Abnormal   Collection Time: 12/23/19  2:49 PM   Specimen: Left Antecubital; Blood  Result Value Ref Range Status   Specimen Description   Final    LEFT ANTECUBITAL Performed at [facility] [address], [place], [place] [address]    Special Requests   Final    BOTTLES DRAWN AEROBIC AND ANAEROBIC Blood Culture adequate volume Performed at [facility] [address], [place], [place] [address]    Culture  Setup Time   Final    AEROBIC BOTTLE ONLY GRAM POSITIVE COCCI CRITICAL RESULT CALLED TO, READ BACK BY AND VERIFIED WITH: B GREEN PHARMD 12/24/19 2215 JDW Performed at [facility], [place] [address] [place], [place] [address]    Culture STAPHYLOCOCCUS AUREUS (A)  Final   Report Status 12/26/2019 FINAL  Final   Organism ID, Bacteria STAPHYLOCOCCUS AUREUS  Final      Susceptibility   Staphylococcus aureus - MIC*    CIPROFLOXACIN <=0.5 SENSITIVE Sensitive     ERYTHROMYCIN <=0.25 SENSITIVE Sensitive     GENTAMICIN <=0.5 SENSITIVE Sensitive     OXACILLIN <=0.25 SENSITIVE Sensitive     TETRACYCLINE <=1 SENSITIVE Sensitive     VANCOMYCIN <=0.5 SENSITIVE Sensitive     TRIMETH/SULFA <=10 SENSITIVE Sensitive     CLINDAMYCIN <=0.25 SENSITIVE Sensitive     RIFAMPIN <=0.5 SENSITIVE Sensitive     Inducible Clindamycin NEGATIVE Sensitive     * STAPHYLOCOCCUS AUREUS  Aerobic/Anaerobic Culture (surgical/deep wound)     Status: None (Preliminary result)   Collection Time: 12/23/19  3:52 PM   Specimen: Wound  Result Value Ref Range Status   Specimen Description   Final    WOUND ABDOMEN Performed at [facility] [address], [place], [place] [address]    Special Requests   Final    NONE Performed at [facility] [place] [address], [place], [place] [address]    Gram Stain   Final    NO WBC SEEN NO ORGANISMS SEEN Performed at [facility], [place] [address], [place], [place] [address]    Culture   Final    FEW ESCHERICHIA COLI FEW STAPHYLOCOCCUS AUREUS NO ANAEROBES ISOLATED; CULTURE IN PROGRESS FOR 5 DAYS    Report Status PENDING  Incomplete   Organism  ID, Bacteria ESCHERICHIA COLI  Final   Organism ID, Bacteria STAPHYLOCOCCUS AUREUS  Final      Susceptibility   Escherichia coli - MIC*    AMPICILLIN >=32 RESISTANT Resistant     CEFAZOLIN <=4 SENSITIVE Sensitive     CEFEPIME <=1 SENSITIVE Sensitive     CEFTAZIDIME <=1 SENSITIVE Sensitive     CEFTRIAXONE <=1 SENSITIVE Sensitive     CIPROFLOXACIN <=0.25 SENSITIVE Sensitive     GENTAMICIN <=1 SENSITIVE Sensitive     IMIPENEM <=0.25 SENSITIVE Sensitive     TRIMETH/SULFA <=20 SENSITIVE Sensitive     AMPICILLIN/SULBACTAM >=32 RESISTANT Resistant     PIP/TAZO <=4 SENSITIVE Sensitive     * FEW ESCHERICHIA COLI   Staphylococcus aureus - MIC*    CIPROFLOXACIN <=0.5 SENSITIVE Sensitive     ERYTHROMYCIN <=0.25 SENSITIVE Sensitive     GENTAMICIN <=0.5 SENSITIVE Sensitive     OXACILLIN <=0.25 SENSITIVE Sensitive     TETRACYCLINE <=1 SENSITIVE Sensitive     VANCOMYCIN <=0.5 SENSITIVE Sensitive     TRIMETH/SULFA <=10 SENSITIVE Sensitive     CLINDAMYCIN <=0.25 SENSITIVE Sensitive     RIFAMPIN <=0.5 SENSITIVE Sensitive     Inducible Clindamycin NEGATIVE Sensitive     * FEW STAPHYLOCOCCUS AUREUS  SARS CORONAVIRUS 2 (TAT 6-24 HRS) Nasopharyngeal Nasopharyngeal Swab     Status: None   Collection Time: 12/23/19  4:55 PM   Specimen: Nasopharyngeal Swab  Result Value Ref Range Status   SARS Coronavirus 2 NEGATIVE NEGATIVE Final    Comment: (NOTE) SARS-CoV-2 target nucleic acids are NOT DETECTED. The  SARS-CoV-2 RNA is generally detectable in upper and lower respiratory specimens during the acute phase of infection. Negative results do not preclude SARS-CoV-2 infection, do not rule out co-infections with other pathogens, and should not be used as the sole basis for treatment or other patient management decisions. Negative results must be combined with clinical observations, patient history, and epidemiological information. The expected result is Negative. Fact Sheet for Patients: SugarRoll.be Fact Sheet for Healthcare Providers: https://www.woods-mathews.com/ This test is not yet approved or cleared by the [place] FDA and  has been authorized for detection and/or diagnosis of SARS-CoV-2 by FDA under an Emergency Use Authorization (EUA). This EUA will remain  in effect (meaning this test can be used) for the duration of the COVID-19 declaration under Section 56 4(b)(1) of the Act, 21 U.S.C. section 360bbb-3(b)(1), unless the authorization is terminated or revoked sooner. Performed at [facility], [place] [address] [place], [place] [address]   Blood Culture (routine x 2)     Status: Abnormal   Collection Time: 12/23/19  7:11 PM   Specimen: BLOOD  Result Value Ref Range Status   Specimen Description   Final    BLOOD BLOOD RIGHT HAND Performed at [facility] [address], [place], [place] [address]    Special Requests   Final    BOTTLES DRAWN AEROBIC ONLY Blood Culture adequate volume Performed at [facility] [address] [place], [place] [address]    Culture  Setup Time   Final    AEROBIC BOTTLE ONLY GRAM POSITIVE COCCI CRITICAL RESULT CALLED TO, READ BACK BY AND VERIFIED WITH: B GREEN,PHARMD AT 0326 12/25/2019 BY L BENFIELD    Culture (A)  Final    STAPHYLOCOCCUS AUREUS SUSCEPTIBILITIES PERFORMED ON PREVIOUS CULTURE WITHIN THE LAST 5 DAYS. Performed at [facility],  [place] [address], [place],  [address]    Report  Status 12/26/2019 FINAL  Final  Blood Culture ID Panel (Reflexed)     Status: Abnormal   Collection Time: 12/23/19  7:11 PM  Result Value Ref Range Status   Enterococcus species NOT DETECTED NOT DETECTED Final   Listeria monocytogenes NOT DETECTED NOT DETECTED Final   Staphylococcus species DETECTED (A) NOT DETECTED Final    Comment: CRITICAL RESULT CALLED TO, READ BACK BY AND VERIFIED WITH: B GREEN,PHARMD AT WL:[id number] 12/25/2019 BY L BENFIELD    Staphylococcus aureus (BCID) DETECTED (A) NOT DETECTED Final    Comment: Methicillin (oxacillin) susceptible Staphylococcus aureus (MSSA). Preferred therapy is anti staphylococcal beta lactam antibiotic (Cefazolin or Nafcillin), unless clinically contraindicated. CRITICAL RESULT CALLED TO, READ BACK BY AND VERIFIED WITH: B GREEN,PHARMD AT 0325 12/25/2019 BY L BENFIELD    Methicillin resistance NOT DETECTED NOT DETECTED Final   Streptococcus species NOT DETECTED NOT DETECTED Final   Streptococcus agalactiae NOT DETECTED NOT DETECTED Final   Streptococcus pneumoniae NOT DETECTED NOT DETECTED Final   Streptococcus pyogenes NOT DETECTED NOT DETECTED Final   Acinetobacter baumannii NOT DETECTED NOT DETECTED Final   Enterobacteriaceae species NOT DETECTED NOT DETECTED Final   Enterobacter cloacae complex NOT DETECTED NOT DETECTED Final   Escherichia coli NOT DETECTED NOT DETECTED Final   Klebsiella oxytoca NOT DETECTED NOT DETECTED Final   Klebsiella pneumoniae NOT DETECTED NOT DETECTED Final   Proteus species NOT DETECTED NOT DETECTED Final   Serratia marcescens NOT DETECTED NOT DETECTED Final   Haemophilus influenzae NOT DETECTED NOT DETECTED Final   Neisseria meningitidis NOT DETECTED NOT DETECTED Final   Pseudomonas aeruginosa NOT DETECTED NOT DETECTED Final   Candida albicans NOT DETECTED NOT DETECTED Final   Candida glabrata NOT DETECTED NOT DETECTED Final   Candida krusei NOT DETECTED NOT  DETECTED Final   Candida parapsilosis NOT DETECTED NOT DETECTED Final   Candida tropicalis NOT DETECTED NOT DETECTED Final    Comment: Performed at [facility], [place]. [address], [place], [place] [address]  C difficile quick scan w PCR reflex     Status: Abnormal   Collection Time: 12/25/19  1:32 PM   Specimen: STOOL  Result Value Ref Range Status   C Diff antigen POSITIVE (A) NEGATIVE Final   C Diff toxin NEGATIVE NEGATIVE Final   C Diff interpretation Results are indeterminate. See PCR results.  Final    Comment: Performed at [facility], [place] [address] [place], [place] [address]  C. Diff by PCR, Reflexed     Status: Abnormal   Collection Time: 12/25/19  1:32 PM  Result Value Ref Range Status   Toxigenic C. Difficile by PCR POSITIVE (A) NEGATIVE Final    Comment: Positive for toxigenic C. difficile with little to no toxin production. Only treat if clinical presentation suggests symptomatic illness. Performed at [facility], [place] [address], [place], [place] [address]   Culture, blood (routine x 2)     Status: None (Preliminary result)   Collection Time: 12/25/19  9:06 PM   Specimen: BLOOD  Result Value Ref Range Status   Specimen Description   Final    BLOOD RIGHT ANTECUBITAL Performed at [facility] [address] [place], [place] [address]    Special Requests   Final    BOTTLES DRAWN AEROBIC ONLY Blood Culture adequate volume Performed at [facility] [address], [place],  [address]    Culture   Final    NO GROWTH 2 DAYS Performed  at [facility], [place] [address], [place], [place] [address]    Report Status PENDING  Incomplete  Culture, blood (routine x 2)     Status: None (Preliminary result)   Collection Time: 12/25/19  9:06 PM   Specimen: BLOOD RIGHT HAND  Result Value Ref Range Status   Specimen Description   Final    BLOOD RIGHT HAND Performed at [facility] [address] [place], [place] [address]    Special Requests   Final    BOTTLES DRAWN AEROBIC ONLY Blood Culture adequate volume Performed at [facility] [address] [place], [place] [address]    Culture   Final    NO GROWTH 2 DAYS Performed at [facility] [address] [place], [place] [address]    Report Status PENDING  Incomplete    Studies/Results: IR REMOVAL TUN ACCESS W/ PORT W/O FL MOD SED  Result Date: 12/26/2019 INDICATION: 73 year old male with staphylococcal bacteremia and an indwelling right chest port catheter which may represent a source for infection. He presents for port catheter explantation and placement of a new PICC for IV antibiotic therapy. EXAM: REMOVAL RIGHT IJ VEIN PORT-A-CATH RIGHT UPPER EXTREMITY PICC PLACEMENT GREATER THAN 35 YEARS OLD MEDICATIONS: PATIENT IS CURRENTLY AN INPATIENT AND RECEIVING INTRAVENOUS ANTIBIOTICS. NO ADDITIONAL ANTIBIOTIC PROPHYLAXIS ADMINISTERED. ANESTHESIA/SEDATION: None FLUOROSCOPY TIME:  Fluoroscopy Time: 0 minutes 30 seconds (6 mGy). COMPLICATIONS: None immediate. PROCEDURE: Informed written consent was obtained from the patient after a thorough discussion of the procedural risks, benefits and alternatives. All questions were addressed. Maximal Sterile Barrier Technique was utilized including caps, mask, sterile gowns, sterile gloves, sterile drape, hand hygiene and skin antiseptic. A timeout was performed prior to the initiation of the procedure. The right chest was prepped and draped in a sterile fashion. Lidocaine was utilized for local anesthesia. An incision was made over the previously healed surgical incision. Utilizing blunt dissection, the port catheter and reservoir were removed from the underlying subcutaneous tissue in their entirety. Securing sutures were also removed. The pocket was irrigated with a copious amount of sterile normal saline. The pocket was closed with interrupted 3-0 Vicryl  stitches. The subcutaneous tissue was closed with 3-0 Vicryl interrupted subcutaneous stitches. A 4-0 Vicryl running subcuticular stitch was utilized to approximate the skin. Dermabond was applied. The right upper extremity was then prepped and draped in standard fashion using chlorhexidine skin prep. A tourniquet was applied. The basilic vein was interrogated with ultrasound and found to be widely patent. An image was obtained and stored for the medical record. Local anesthesia was attained by infiltration with 1% lidocaine. A small dermatotomy was made. Under real-time sonographic guidance, the vessel was punctured with a 21 gauge micropuncture needle. Using standard technique, the initial micro needle was exchanged over a 0.018 micro wire for a peel-away sheath. A dual lumen power injectable PICC line was then cut to 36 cm and advanced through the peel-away sheath. The catheter tip was positioned at the superior cavoatrial junction. The peel-away sheath was removed and discarded. The catheter was secured to the skin with an adhesive fixation device. The lumens were flushed with heparinized saline and capped. Images were obtained for the medical record document catheter tip position. The patient tolerated the procedure well. IMPRESSION: 1. Successful explantation of existing right chest port catheter. No evidence of purulence or infection in the reservoir pocket. Therefore, the incision was closed primarily. 2. Successful placement of a right upper extremity 36  cm dual lumen power PICC. The catheter tip is at the cavoatrial junction and the catheter is ready for immediate use. Electronically Signed   By: Jacqulynn Cadet M.D.   On: 12/26/2019 13:09   IR PICC PLACEMENT RIGHT >5 YRS INC IMG GUIDE  Result Date: 12/26/2019 INDICATION: 73 year old male with staphylococcal bacteremia and an indwelling right chest port catheter which may represent a source for infection. He presents for port catheter explantation  and placement of a new PICC for IV antibiotic therapy. EXAM: REMOVAL RIGHT IJ VEIN PORT-A-CATH RIGHT UPPER EXTREMITY PICC PLACEMENT GREATER THAN 28 YEARS OLD MEDICATIONS: PATIENT IS CURRENTLY AN INPATIENT AND RECEIVING INTRAVENOUS ANTIBIOTICS. NO ADDITIONAL ANTIBIOTIC PROPHYLAXIS ADMINISTERED. ANESTHESIA/SEDATION: None FLUOROSCOPY TIME:  Fluoroscopy Time: 0 minutes 30 seconds (6 mGy). COMPLICATIONS: None immediate. PROCEDURE: Informed written consent was obtained from the patient after a thorough discussion of the procedural risks, benefits and alternatives. All questions were addressed. Maximal Sterile Barrier Technique was utilized including caps, mask, sterile gowns, sterile gloves, sterile drape, hand hygiene and skin antiseptic. A timeout was performed prior to the initiation of the procedure. The right chest was prepped and draped in a sterile fashion. Lidocaine was utilized for local anesthesia. An incision was made over the previously healed surgical incision. Utilizing blunt dissection, the port catheter and reservoir were removed from the underlying subcutaneous tissue in their entirety. Securing sutures were also removed. The pocket was irrigated with a copious amount of sterile normal saline. The pocket was closed with interrupted 3-0 Vicryl stitches. The subcutaneous tissue was closed with 3-0 Vicryl interrupted subcutaneous stitches. A 4-0 Vicryl running subcuticular stitch was utilized to approximate the skin. Dermabond was applied. The right upper extremity was then prepped and draped in standard fashion using chlorhexidine skin prep. A tourniquet was applied. The basilic vein was interrogated with ultrasound and found to be widely patent. An image was obtained and stored for the medical record. Local anesthesia was attained by infiltration with 1% lidocaine. A small dermatotomy was made. Under real-time sonographic guidance, the vessel was punctured with a 21 gauge micropuncture needle. Using  standard technique, the initial micro needle was exchanged over a 0.018 micro wire for a peel-away sheath. A dual lumen power injectable PICC line was then cut to 36 cm and advanced through the peel-away sheath. The catheter tip was positioned at the superior cavoatrial junction. The peel-away sheath was removed and discarded. The catheter was secured to the skin with an adhesive fixation device. The lumens were flushed with heparinized saline and capped. Images were obtained for the medical record document catheter tip position. The patient tolerated the procedure well. IMPRESSION: 1. Successful explantation of existing right chest port catheter. No evidence of purulence or infection in the reservoir pocket. Therefore, the incision was closed primarily. 2. Successful placement of a right upper extremity 36 cm dual lumen power PICC. The catheter tip is at the cavoatrial junction and the catheter is ready for immediate use. Electronically Signed   By: Jacqulynn Cadet M.D.   On: 12/26/2019 13:09     Assessment/Plan: Staph aureus bacteremia (MSSA suspected) UTI- > 100k GNR Wound dehisc             Cx MSSA and E coli C diff Bladder Ca Prostate Ca DM2 CKD 3  Total days of antibiotics: 4 ceftriaxone --> ancef  2 po vanco  await UCx- Pantoea and MSSA BM better Repeat BCx 12-25 are ngtd TEE 12-28? Glc fairly well controlled.          Bobby Rumpf MD, FACP Infectious Diseases (pager) [phone] [email or web address] 12/27/2019, 2:22 PM  LOS: 3 days

## 2019-12-28 ENCOUNTER — Inpatient Hospital Stay (HOSPITAL_COMMUNITY): Payer: Medicare Other

## 2019-12-28 DIAGNOSIS — R7881 Bacteremia: Secondary | ICD-10-CM

## 2019-12-28 LAB — AEROBIC/ANAEROBIC CULTURE W GRAM STAIN (SURGICAL/DEEP WOUND): Gram Stain: NONE SEEN

## 2019-12-28 LAB — GLUCOSE, CAPILLARY
Glucose-Capillary: 105 mg/dL — ABNORMAL HIGH (ref 70–99)
Glucose-Capillary: 120 mg/dL — ABNORMAL HIGH (ref 70–99)
Glucose-Capillary: 123 mg/dL — ABNORMAL HIGH (ref 70–99)
Glucose-Capillary: 123 mg/dL — ABNORMAL HIGH (ref 70–99)
Glucose-Capillary: 125 mg/dL — ABNORMAL HIGH (ref 70–99)
Glucose-Capillary: 126 mg/dL — ABNORMAL HIGH (ref 70–99)
Glucose-Capillary: 99 mg/dL (ref 70–99)

## 2019-12-28 LAB — ECHOCARDIOGRAM COMPLETE
Height: 68 in
Weight: 3816.6 oz

## 2019-12-28 NOTE — Progress Notes (Signed)
INFECTIOUS DISEASE PROGRESS NOTE  ID: Jimmy [name] is a 73 y.o. male with  Active Problems:   Bladder cancer ([place])  Subjective: No complaints.    Abtx:  Anti-infectives (From admission, onward)   Start     Dose/Rate Route Frequency Ordered Stop   12/26/19 1200  vancomycin (VANCOCIN) 50 mg/mL oral solution 125 mg     125 mg Oral Every 6 hours 12/26/19 1129     12/26/19 1115  vancomycin (VANCOCIN) 50 mg/mL oral solution 125 mg  Status:  Discontinued     125 mg Oral 4 times daily 12/26/19 1102 12/26/19 1128   12/26/19 0500  ceFAZolin (ANCEF) IVPB 2g/100 mL premix     2 g 200 mL/hr over 30 Minutes Intravenous Every 8 hours 12/25/19 1724     12/26/19 0430  cefTRIAXone (ROCEPHIN) 2 g in sodium chloride 0.9 % 100 mL IVPB  Status:  Discontinued    Note to Pharmacy: Patient tolerated cefoxitin perioperatively without significant reaction despite allergy list   2 g 200 mL/hr over 30 Minutes Intravenous Daily 12/25/19 0343 12/25/19 0421   12/25/19 0430  cefTRIAXone (ROCEPHIN) 2 g in sodium chloride 0.9 % 100 mL IVPB  Status:  Discontinued    Note to Pharmacy: Patient tolerated cefoxitin perioperatively without significant reaction despite allergy list   2 g 200 mL/hr over 30 Minutes Intravenous Daily 12/25/19 0421 12/25/19 1621   12/23/19 1900  cefTRIAXone (ROCEPHIN) 1 g in sodium chloride 0.9 % 100 mL IVPB  Status:  Discontinued    Note to Pharmacy: Patient tolerated cefoxitin perioperatively without significant reaction despite allergy list   1 g 200 mL/hr over 30 Minutes Intravenous Every 24 hours 12/23/19 1852 12/25/19 0343      Medications:  Scheduled: . Chlorhexidine Gluconate Cloth  6 each Topical Daily  . enoxaparin (LOVENOX) injection  40 mg Subcutaneous Q24H  . insulin aspart  0-15 Units Subcutaneous Q4H  . vancomycin  125 mg Oral Q6H    Objective: Vital signs in last 24 hours: Temp:  [97.6 F (36.4 C)-97.8 F (36.6 C)] 97.6 F (36.4 C) (12/28 0423) Pulse Rate:   [86-89] 86 (12/28 0423) Resp:  [18] 18 (12/28 0423) BP: (130-137)/(65-76) 137/76 (12/28 0423) SpO2:  [97 %] 97 % (12/28 0423)   General appearance: alert, cooperative and no distress Resp: clear to auscultation bilaterally Cardio: regular rate and rhythm GI: normal findings: bowel sounds normal and soft, non-tender and wound is unchanged. no d/c, no surroudning erythema.   Lab Results Recent Labs    12/25/19 1759 12/26/19 0552  WBC 6.3 5.5  HGB 8.4* 9.5*  HCT 27.9* 31.4*  NA  --  142  K  --  3.9  CL  --  117*  CO2  --  15*  BUN  --  27*  CREATININE  --  1.44*   Liver Panel No results for input(s): PROT, ALBUMIN, AST, ALT, ALKPHOS, BILITOT, BILIDIR, IBILI in the last 72 hours. Sedimentation Rate No results for input(s): ESRSEDRATE in the last 72 hours. C-Reactive Protein No results for input(s): CRP in the last 72 hours.  Microbiology: Recent Results (from the past 240 hour(s))  Urine culture     Status: Abnormal (Preliminary result)   Collection Time: 12/23/19  2:36 PM   Specimen: In/Out Cath Urine  Result Value Ref Range Status   Specimen Description   Final    IN/OUT CATH URINE Performed at [facility] [address], [place], [place] [address]  Special Requests   Final    NONE Performed at [facility], [place] [address] [place], [place] [address]    Culture (A)  Final    >=100,000 COLONIES/mL PANTOEA SPECIES CULTURE REINCUBATED FOR BETTER GROWTH >=100,000 COLONIES/mL STAPHYLOCOCCUS AUREUS    Report Status PENDING  Incomplete   Organism ID, Bacteria STAPHYLOCOCCUS AUREUS (A)  Final      Susceptibility   Staphylococcus aureus - MIC*    CIPROFLOXACIN <=0.5 SENSITIVE Sensitive     GENTAMICIN <=0.5 SENSITIVE Sensitive     NITROFURANTOIN <=16 SENSITIVE Sensitive     OXACILLIN <=0.25 SENSITIVE Sensitive     TETRACYCLINE <=1 SENSITIVE Sensitive     VANCOMYCIN 1 SENSITIVE Sensitive     TRIMETH/SULFA <=10 SENSITIVE  Sensitive     CLINDAMYCIN <=0.25 SENSITIVE Sensitive     RIFAMPIN <=0.5 SENSITIVE Sensitive     Inducible Clindamycin NEGATIVE Sensitive     * >=100,000 COLONIES/mL STAPHYLOCOCCUS AUREUS  Blood Culture (routine x 2)     Status: Abnormal   Collection Time: 12/23/19  2:49 PM   Specimen: Left Antecubital; Blood  Result Value Ref Range Status   Specimen Description   Final    LEFT ANTECUBITAL Performed at [facility] [address], [place], [place] [address]    Special Requests   Final    BOTTLES DRAWN AEROBIC AND ANAEROBIC Blood Culture adequate volume Performed at [facility] [address], [place], [place] [address]    Culture  Setup Time   Final    AEROBIC BOTTLE ONLY GRAM POSITIVE COCCI CRITICAL RESULT CALLED TO, READ BACK BY AND VERIFIED WITH: B GREEN PHARMD 12/24/19 2215 JDW Performed at [facility], [place] [address] [place], [place] [address]    Culture STAPHYLOCOCCUS AUREUS (A)  Final   Report Status 12/26/2019 FINAL  Final   Organism ID, Bacteria STAPHYLOCOCCUS AUREUS  Final      Susceptibility   Staphylococcus aureus - MIC*    CIPROFLOXACIN <=0.5 SENSITIVE Sensitive     ERYTHROMYCIN <=0.25 SENSITIVE Sensitive     GENTAMICIN <=0.5 SENSITIVE Sensitive     OXACILLIN <=0.25 SENSITIVE Sensitive     TETRACYCLINE <=1 SENSITIVE Sensitive     VANCOMYCIN <=0.5 SENSITIVE Sensitive     TRIMETH/SULFA <=10 SENSITIVE Sensitive     CLINDAMYCIN <=0.25 SENSITIVE Sensitive     RIFAMPIN <=0.5 SENSITIVE Sensitive     Inducible Clindamycin NEGATIVE Sensitive     * STAPHYLOCOCCUS AUREUS  Aerobic/Anaerobic Culture (surgical/deep wound)     Status: None (Preliminary result)   Collection Time: 12/23/19  3:52 PM   Specimen: Wound  Result Value Ref Range Status   Specimen Description   Final    WOUND ABDOMEN Performed at [facility] [address] [place], [place] [address]    Special Requests   Final    NONE Performed  at [facility], [place] [address], [place], [place] [address]    Gram Stain   Final    NO WBC SEEN NO ORGANISMS SEEN Performed at [facility], [place] [address], [place],  [address]    Culture FEW ESCHERICHIA COLI FEW STAPHYLOCOCCUS AUREUS   Final   Report Status PENDING  Incomplete   Organism ID, Bacteria ESCHERICHIA COLI  Final   Organism ID, Bacteria STAPHYLOCOCCUS AUREUS  Final      Susceptibility   Escherichia coli - MIC*    AMPICILLIN >=32 RESISTANT Resistant     CEFAZOLIN <=  4 SENSITIVE Sensitive     CEFEPIME <=1 SENSITIVE Sensitive     CEFTAZIDIME <=1 SENSITIVE Sensitive     CEFTRIAXONE <=1 SENSITIVE Sensitive     CIPROFLOXACIN <=0.25 SENSITIVE Sensitive     GENTAMICIN <=1 SENSITIVE Sensitive     IMIPENEM <=0.25 SENSITIVE Sensitive     TRIMETH/SULFA <=20 SENSITIVE Sensitive     AMPICILLIN/SULBACTAM >=32 RESISTANT Resistant     PIP/TAZO <=4 SENSITIVE Sensitive     * FEW ESCHERICHIA COLI   Staphylococcus aureus - MIC*    CIPROFLOXACIN <=0.5 SENSITIVE Sensitive     ERYTHROMYCIN <=0.25 SENSITIVE Sensitive     GENTAMICIN <=0.5 SENSITIVE Sensitive     OXACILLIN <=0.25 SENSITIVE Sensitive     TETRACYCLINE <=1 SENSITIVE Sensitive     VANCOMYCIN <=0.5 SENSITIVE Sensitive     TRIMETH/SULFA <=10 SENSITIVE Sensitive     CLINDAMYCIN <=0.25 SENSITIVE Sensitive     RIFAMPIN <=0.5 SENSITIVE Sensitive     Inducible Clindamycin NEGATIVE Sensitive     * FEW STAPHYLOCOCCUS AUREUS  SARS CORONAVIRUS 2 (TAT 6-24 HRS) Nasopharyngeal Nasopharyngeal Swab     Status: None   Collection Time: 12/23/19  4:55 PM   Specimen: Nasopharyngeal Swab  Result Value Ref Range Status   SARS Coronavirus 2 NEGATIVE NEGATIVE Final    Comment: (NOTE) SARS-CoV-2 target nucleic acids are NOT DETECTED. The SARS-CoV-2 RNA is generally detectable in upper and lower respiratory specimens during the acute phase of infection. Negative results do not preclude SARS-CoV-2 infection, do not  rule out co-infections with other pathogens, and should not be used as the sole basis for treatment or other patient management decisions. Negative results must be combined with clinical observations, patient history, and epidemiological information. The expected result is Negative. Fact Sheet for Patients: SugarRoll.be Fact Sheet for Healthcare Providers: https://www.woods-mathews.com/ This test is not yet approved or cleared by the [place] FDA and  has been authorized for detection and/or diagnosis of SARS-CoV-2 by FDA under an Emergency Use Authorization (EUA). This EUA will remain  in effect (meaning this test can be used) for the duration of the COVID-19 declaration under Section 56 4(b)(1) of the Act, 21 U.S.C. section 360bbb-3(b)(1), unless the authorization is terminated or revoked sooner. Performed at [facility], [place] [address], [place], [place] [address]   Blood Culture (routine x 2)     Status: Abnormal   Collection Time: 12/23/19  7:11 PM   Specimen: BLOOD  Result Value Ref Range Status   Specimen Description   Final    BLOOD BLOOD RIGHT HAND Performed at [facility] [address], [place], [place] [address]    Special Requests   Final    BOTTLES DRAWN AEROBIC ONLY Blood Culture adequate volume Performed at [facility] [address], [place], [place] [address]    Culture  Setup Time   Final    AEROBIC BOTTLE ONLY GRAM POSITIVE COCCI CRITICAL RESULT CALLED TO, READ BACK BY AND VERIFIED WITH: B GREEN,PHARMD AT 0326 12/25/2019 BY L BENFIELD    Culture (A)  Final    STAPHYLOCOCCUS AUREUS SUSCEPTIBILITIES PERFORMED ON PREVIOUS CULTURE WITHIN THE LAST 5 DAYS. Performed at [facility], [place] [address], [place], [place] [address]    Report Status 12/26/2019 FINAL  Final  Blood Culture ID Panel (Reflexed)     Status: Abnormal   Collection Time: 12/23/19  7:11 PM    Result Value Ref Range Status   Enterococcus species NOT DETECTED NOT  DETECTED Final   Listeria monocytogenes NOT DETECTED NOT DETECTED Final   Staphylococcus species DETECTED (A) NOT DETECTED Final    Comment: CRITICAL RESULT CALLED TO, READ BACK BY AND VERIFIED WITH: B GREEN,PHARMD AT [id number] 12/25/2019 BY L BENFIELD    Staphylococcus aureus (BCID) DETECTED (A) NOT DETECTED Final    Comment: Methicillin (oxacillin) susceptible Staphylococcus aureus (MSSA). Preferred therapy is anti staphylococcal beta lactam antibiotic (Cefazolin or Nafcillin), unless clinically contraindicated. CRITICAL RESULT CALLED TO, READ BACK BY AND VERIFIED WITH: B GREEN,PHARMD AT 0325 12/25/2019 BY L BENFIELD    Methicillin resistance NOT DETECTED NOT DETECTED Final   Streptococcus species NOT DETECTED NOT DETECTED Final   Streptococcus agalactiae NOT DETECTED NOT DETECTED Final   Streptococcus pneumoniae NOT DETECTED NOT DETECTED Final   Streptococcus pyogenes NOT DETECTED NOT DETECTED Final   Acinetobacter baumannii NOT DETECTED NOT DETECTED Final   Enterobacteriaceae species NOT DETECTED NOT DETECTED Final   Enterobacter cloacae complex NOT DETECTED NOT DETECTED Final   Escherichia coli NOT DETECTED NOT DETECTED Final   Klebsiella oxytoca NOT DETECTED NOT DETECTED Final   Klebsiella pneumoniae NOT DETECTED NOT DETECTED Final   Proteus species NOT DETECTED NOT DETECTED Final   Serratia marcescens NOT DETECTED NOT DETECTED Final   Haemophilus influenzae NOT DETECTED NOT DETECTED Final   Neisseria meningitidis NOT DETECTED NOT DETECTED Final   Pseudomonas aeruginosa NOT DETECTED NOT DETECTED Final   Candida albicans NOT DETECTED NOT DETECTED Final   Candida glabrata NOT DETECTED NOT DETECTED Final   Candida krusei NOT DETECTED NOT DETECTED Final   Candida parapsilosis NOT DETECTED NOT DETECTED Final   Candida tropicalis NOT DETECTED NOT DETECTED Final    Comment: Performed at [facility], [place].  [address], [place], [place] [address]  C difficile quick scan w PCR reflex     Status: Abnormal   Collection Time: 12/25/19  1:32 PM   Specimen: STOOL  Result Value Ref Range Status   C Diff antigen POSITIVE (A) NEGATIVE Final   C Diff toxin NEGATIVE NEGATIVE Final   C Diff interpretation Results are indeterminate. See PCR results.  Final    Comment: Performed at [facility], [place] [address], [place], [place] [address]  C. Diff by PCR, Reflexed     Status: Abnormal   Collection Time: 12/25/19  1:32 PM  Result Value Ref Range Status   Toxigenic C. Difficile by PCR POSITIVE (A) NEGATIVE Final    Comment: Positive for toxigenic C. difficile with little to no toxin production. Only treat if clinical presentation suggests symptomatic illness. Performed at [facility], [place] [address], [place], [place] [address]   Culture, blood (routine x 2)     Status: None (Preliminary result)   Collection Time: 12/25/19  9:06 PM   Specimen: BLOOD  Result Value Ref Range Status   Specimen Description   Final    BLOOD RIGHT ANTECUBITAL Performed at [facility] [address] [place], [place] [address]    Special Requests   Final    BOTTLES DRAWN AEROBIC ONLY Blood Culture adequate volume Performed at [facility] [address], [place], [place] [address]    Culture   Final    NO GROWTH 2 DAYS Performed at [facility] [address] [place],  [address]    Report Status PENDING  Incomplete  Culture, blood (routine x 2)     Status: None (Preliminary result)   Collection Time:  12/25/19  9:06 PM   Specimen: BLOOD RIGHT HAND  Result Value Ref Range Status   Specimen Description   Final    BLOOD RIGHT HAND Performed at [facility] [address], [place], [place] [address]    Special Requests   Final    BOTTLES DRAWN AEROBIC ONLY Blood Culture adequate volume Performed at [facility] [address] [place], [place] [address]    Culture   Final    NO GROWTH 2 DAYS Performed at [facility] [address] [place], [place] [address]    Report Status PENDING  Incomplete    Studies/Results: IR REMOVAL TUN ACCESS W/ PORT W/O FL MOD SED  Result Date: 12/26/2019 INDICATION: 73 year old male with staphylococcal bacteremia and an indwelling right chest port catheter which may represent a source for infection. He presents for port catheter explantation and placement of a new PICC for IV antibiotic therapy. EXAM: REMOVAL RIGHT IJ VEIN PORT-A-CATH RIGHT UPPER EXTREMITY PICC PLACEMENT GREATER THAN 44 YEARS OLD MEDICATIONS: PATIENT IS CURRENTLY AN INPATIENT AND RECEIVING INTRAVENOUS ANTIBIOTICS. NO ADDITIONAL ANTIBIOTIC PROPHYLAXIS ADMINISTERED. ANESTHESIA/SEDATION: None FLUOROSCOPY TIME:  Fluoroscopy Time: 0 minutes 30 seconds (6 mGy). COMPLICATIONS: None immediate. PROCEDURE: Informed written consent was obtained from the patient after a thorough discussion of the procedural risks, benefits and alternatives. All questions were addressed. Maximal Sterile Barrier Technique was utilized including caps, mask, sterile gowns, sterile gloves, sterile drape, hand hygiene and skin antiseptic. A timeout was performed prior to the initiation of the procedure. The right chest was prepped and draped in a sterile fashion. Lidocaine was utilized for local anesthesia. An incision was made over the previously healed surgical incision. Utilizing blunt dissection, the port catheter and reservoir were removed from the underlying subcutaneous tissue in their entirety. Securing sutures were also removed. The pocket was irrigated with a copious amount of sterile normal saline. The pocket was closed with interrupted 3-0 Vicryl stitches. The subcutaneous tissue was closed with 3-0 Vicryl interrupted subcutaneous stitches. A 4-0 Vicryl running subcuticular stitch was utilized to approximate the skin. Dermabond  was applied. The right upper extremity was then prepped and draped in standard fashion using chlorhexidine skin prep. A tourniquet was applied. The basilic vein was interrogated with ultrasound and found to be widely patent. An image was obtained and stored for the medical record. Local anesthesia was attained by infiltration with 1% lidocaine. A small dermatotomy was made. Under real-time sonographic guidance, the vessel was punctured with a 21 gauge micropuncture needle. Using standard technique, the initial micro needle was exchanged over a 0.018 micro wire for a peel-away sheath. A dual lumen power injectable PICC line was then cut to 36 cm and advanced through the peel-away sheath. The catheter tip was positioned at the superior cavoatrial junction. The peel-away sheath was removed and discarded. The catheter was secured to the skin with an adhesive fixation device. The lumens were flushed with heparinized saline and capped. Images were obtained for the medical record document catheter tip position. The patient tolerated the procedure well. IMPRESSION: 1. Successful explantation of existing right chest port catheter. No evidence of purulence or infection in the reservoir pocket. Therefore, the incision was closed primarily. 2. Successful placement of a right upper extremity 36 cm dual lumen power PICC. The catheter tip is at the cavoatrial junction and the catheter is ready for immediate use. Electronically Signed   By: Jacqulynn Cadet M.D.   On: 12/26/2019 13:09   ECHOCARDIOGRAM  COMPLETE  Result Date: 12/28/2019   ECHOCARDIOGRAM REPORT   Patient Name:   Jimmy [name] Date of Exam: 12/28/2019 Medical Rec #:  [id number]        Height:       68.0 in Accession #:    [id number]       Weight:       238.5 lb Date of Birth:  06/07/1946         BSA:          2.20 m Patient Age:    73 years         BP:           137/76 mmHg Patient Gender: M                HR:           86 bpm. Exam Location:  Inpatient  Procedure: 2D Echo, Cardiac Doppler and Color Doppler Indications:    Bacteremia  History:        Patient has prior history of Echocardiogram examinations, most                 recent 12/19/2017. CAD, Signs/Symptoms:Bacteremia and MSSA; Risk                 Factors:Hypertension, Dyslipidemia, Obesity and Diabetes.                 Bladder cancer, s/p surgery for bladder cancer.  Sonographer:    Dustin Flock Referring Phys: [phone] [facility]  Sonographer Comments: Suboptimal parasternal window, Technically difficult study due to poor echo windows and patient is morbidly obese. IMPRESSIONS  1. Left ventricular ejection fraction, by visual estimation, is 65 to 70%. The left ventricle has hyperdynamic function. There is no left ventricular hypertrophy.  2. Left ventricular diastolic parameters are consistent with Grade I diastolic dysfunction (impaired relaxation).  3. The left ventricle has no regional wall motion abnormalities.  4. Global right ventricle has normal systolic function.The right ventricular size is normal. No increase in right ventricular wall thickness.  5. Left atrial size was normal.  6. Right atrial size was normal.  7. The mitral valve is abnormal. Trivial mitral valve regurgitation.  8. The tricuspid valve is not well visualized.  9. The aortic valve was not well visualized. Aortic valve regurgitation is not visualized. 10. The pulmonic valve was grossly normal. Pulmonic valve regurgitation is not visualized. 11. The inferior vena cava is normal in size with greater than 50% respiratory variability, suggesting right atrial pressure of 3 mmHg. 12. The interatrial septum was not assessed. 13. Technically difficult study, suboptimal valve visualization but no obvious large valve vegetations. FINDINGS  Left Ventricle: Left ventricular ejection fraction, by visual estimation, is 65 to 70%. The left ventricle has hyperdynamic function. The left ventricle has no regional wall motion abnormalities.  There is no left ventricular hypertrophy. Left ventricular diastolic parameters are consistent with Grade I diastolic dysfunction (impaired relaxation). Indeterminate filling pressures. Right Ventricle: The right ventricular size is normal. No increase in right ventricular wall thickness. Global RV systolic function is has normal systolic function. Left Atrium: Left atrial size was normal in size. Right Atrium: Right atrial size was normal in size Pericardium: There is no evidence of pericardial effusion. Mitral Valve: The mitral valve is abnormal. There is mild thickening of the mitral valve leaflet(s). Trivial mitral valve regurgitation. Tricuspid Valve: The tricuspid valve is not well visualized. Tricuspid valve regurgitation is trivial. Aortic Valve: The aortic valve  was not well visualized. Aortic valve regurgitation is not visualized. Pulmonic Valve: The pulmonic valve was grossly normal. Pulmonic valve regurgitation is not visualized. Pulmonic regurgitation is not visualized. Aorta: The aortic root and ascending aorta are structurally normal, with no evidence of dilitation. Venous: The inferior vena cava is normal in size with greater than 50% respiratory variability, suggesting right atrial pressure of 3 mmHg. IAS/Shunts: The interatrial septum was not assessed.  LEFT VENTRICLE PLAX 2D LVIDd:         5.70 cm  Diastology LVIDs:         3.40 cm  LV e' lateral:   9.25 cm/s LV PW:         1.00 cm  LV E/e' lateral: 8.0 LV IVS:        0.90 cm  LV e' medial:    6.31 cm/s LVOT diam:     2.10 cm  LV E/e' medial:  11.7 LV SV:         113 ml LV SV Index:   48.50 LVOT Area:     3.46 cm  RIGHT VENTRICLE RV Basal diam:  3.00 cm RV S prime:     12.10 cm/s TAPSE (M-mode): 2.4 cm LEFT ATRIUM           Index       RIGHT ATRIUM           Index LA diam:      4.00 cm 1.82 cm/m  RA Area:     11.60 cm LA Vol (A4C): 57.9 ml 26.29 ml/m RA Volume:   26.40 ml  11.99 ml/m  AORTIC VALVE LVOT Vmax:   119.00 cm/s LVOT Vmean:  87.200  cm/s LVOT VTI:    0.255 m  AORTA Ao Root diam: 2.90 cm MITRAL VALVE MV Area (PHT): 7.16 cm             SHUNTS MV PHT:        30.74 msec           Systemic VTI:  0.26 m MV Decel Time: 106 msec             Systemic Diam: 2.10 cm MV E velocity: 73.70 cm/s 103 cm/s MV A velocity: 93.40 cm/s 70.3 cm/s MV E/A ratio:  0.79       1.5  Lyman Bishop MD Electronically signed by Lyman Bishop MD Signature Date/Time: 12/28/2019/10:28:06 AM    Final    IR PICC PLACEMENT RIGHT >5 YRS INC IMG GUIDE  Result Date: 12/26/2019 INDICATION: 73 year old male with staphylococcal bacteremia and an indwelling right chest port catheter which may represent a source for infection. He presents for port catheter explantation and placement of a new PICC for IV antibiotic therapy. EXAM: REMOVAL RIGHT IJ VEIN PORT-A-CATH RIGHT UPPER EXTREMITY PICC PLACEMENT GREATER THAN 66 YEARS OLD MEDICATIONS: PATIENT IS CURRENTLY AN INPATIENT AND RECEIVING INTRAVENOUS ANTIBIOTICS. NO ADDITIONAL ANTIBIOTIC PROPHYLAXIS ADMINISTERED. ANESTHESIA/SEDATION: None FLUOROSCOPY TIME:  Fluoroscopy Time: 0 minutes 30 seconds (6 mGy). COMPLICATIONS: None immediate. PROCEDURE: Informed written consent was obtained from the patient after a thorough discussion of the procedural risks, benefits and alternatives. All questions were addressed. Maximal Sterile Barrier Technique was utilized including caps, mask, sterile gowns, sterile gloves, sterile drape, hand hygiene and skin antiseptic. A timeout was performed prior to the initiation of the procedure. The right chest was prepped and draped in a sterile fashion. Lidocaine was utilized for local anesthesia. An incision was made over the previously healed surgical incision. Utilizing blunt  dissection, the port catheter and reservoir were removed from the underlying subcutaneous tissue in their entirety. Securing sutures were also removed. The pocket was irrigated with a copious amount of sterile normal saline. The pocket was  closed with interrupted 3-0 Vicryl stitches. The subcutaneous tissue was closed with 3-0 Vicryl interrupted subcutaneous stitches. A 4-0 Vicryl running subcuticular stitch was utilized to approximate the skin. Dermabond was applied. The right upper extremity was then prepped and draped in standard fashion using chlorhexidine skin prep. A tourniquet was applied. The basilic vein was interrogated with ultrasound and found to be widely patent. An image was obtained and stored for the medical record. Local anesthesia was attained by infiltration with 1% lidocaine. A small dermatotomy was made. Under real-time sonographic guidance, the vessel was punctured with a 21 gauge micropuncture needle. Using standard technique, the initial micro needle was exchanged over a 0.018 micro wire for a peel-away sheath. A dual lumen power injectable PICC line was then cut to 36 cm and advanced through the peel-away sheath. The catheter tip was positioned at the superior cavoatrial junction. The peel-away sheath was removed and discarded. The catheter was secured to the skin with an adhesive fixation device. The lumens were flushed with heparinized saline and capped. Images were obtained for the medical record document catheter tip position. The patient tolerated the procedure well. IMPRESSION: 1. Successful explantation of existing right chest port catheter. No evidence of purulence or infection in the reservoir pocket. Therefore, the incision was closed primarily. 2. Successful placement of a right upper extremity 36 cm dual lumen power PICC. The catheter tip is at the cavoatrial junction and the catheter is ready for immediate use. Electronically Signed   By: Jacqulynn Cadet M.D.   On: 12/26/2019 13:09     Assessment/Plan: Staph aureus bacteremia (MSSA suspected) UTI- > 100k GNR Wound dehisc Cx MSSA and E coli C diff Bladder Ca Prostate Ca DM2 CKD 3  Total days of antibiotics:5 ceftriaxone -->  ancef 3 po vanco  He needs TEE, will ask CV If he is not able to have TEE, would rec 4 weeks of ancef and 6 weeks of po vanco (current dose vanco for 2 weeks then 500mg  qday for remaining 4 weeks).  sensi for Pantoea pending.  Appreciate wound care eval.          Bobby Rumpf MD, FACP Infectious Diseases (pager) [phone] [email or web address] 12/28/2019, 12:12 PM  LOS: 4 days

## 2019-12-28 NOTE — Care Management Important Message (Signed)
Important Message  Patient Details IM Letter given to [place] Case Manager to present to the Patient Name: Jimmy [name] MRN: [id number] Date of Birth: Jul 29, 1946   Medicare Important Message Given:  Yes     [name] 12/28/2019, 12:28 PM

## 2019-12-28 NOTE — Progress Notes (Signed)
PT Cancellation Note  Patient Details Name: Jimmy [name] MRN: [id number] DOB: 26-Apr-1946   Cancelled Treatment:    Reason Eval/Treat Not Completed: Fatigue/lethargy limiting ability to participate Pt too fatigued, wife states he was up in chair for 2 hours and has had a long day.   [facility] 12/28/2019, 3:26 PM

## 2019-12-28 NOTE — Progress Notes (Signed)
  Echocardiogram 2D Echocardiogram has been performed.  Matilde Bash 12/28/2019, 8:51 AM

## 2019-12-28 NOTE — Progress Notes (Signed)
Patient ID: Jimmy [name], male   DOB: 1946/03/30, 73 y.o.   MRN: [id number]    Subjective: Pt s/p port removal and PICC insertion Saturday.  Clinically improving.  On cefazolin for MSSA bacteremia.  Initially thought to have separate bacteria in urine (GNRs) but now urine has grown MSSA as well.  Regardless of source, he is on appropriate antibiotic therapy and will defer to Dr. Algis Downs recommendation for IV antibiotic and duration of therapy when ready for discharge.  Echo performed this morning a technically poor study but no obvious valve vegetations.  Also growing pantoea in urine.  Eating better.  Still very weak.  Wife states he continues to have a large amount of leakage from wound or penis when ambulating.  On oral vancomycin for positive C. Diff.  Objective: Vital signs in last 24 hours: Temp:  [97.6 F (36.4 C)-97.8 F (36.6 C)] 97.6 F (36.4 C) (12/28 0423) Pulse Rate:  [86-89] 86 (12/28 0423) Resp:  [18] 18 (12/28 0423) BP: (130-137)/(65-76) 137/76 (12/28 0423) SpO2:  [97 %] 97 % (12/28 0423)  Intake/Output from previous day: 12/27 0701 - 12/28 0700 In: 3522.5 [P.O.:560; I.V.:2762.5; IV Piggyback:200] Out: 1950 [Urine:1950] Intake/Output this shift: No intake/output data recorded.  Physical Exam:  General: Alert and oriented Abdomen: Soft, ND, urostomy pink and draining well. Incisions: Wound pack with good granulation tissue.  I did identify some clear drainage and probed wound today with some additional seroma fluid expelled.  Fascia still feels intact. Ext: NT, No erythema  Lab Results: Recent Labs    12/25/19 1759 12/26/19 0552  HGB 8.4* 9.5*  HCT 27.9* 31.4*   BMET Recent Labs    12/26/19 0552  NA 142  K 3.9  CL 117*  CO2 15*  GLUCOSE 121*  BUN 27*  CREATININE 1.44*  CALCIUM 8.0*     Studies/Results:   Assessment/Plan: Bacteremia/possible pyelonephritis s/p radical cystoprostatectomy and ileal conduit 12/10  1. ID:  ID following for MSSA  bactermia and urine culture. Pantoea growing in urine also that is being reincubated. On day 4 cefazolin IV.  Will ask ID for final recommendations for duration of IV antibiotic therapy and most appropriate agent.  Also on oral vancomycin (day 3) for c diff. 2. Renal: Cr improved with hydration.  Check renal function in AM tomorrow. 3. Wound care: Continue wet to dry dressings.  Increased clear wound drainage noted.  Wound probed and opened a little more.  Fascia feels intact. 4. Ambulate/PT.  Expect this to improve with treatment of infection.  He can resume home PT upon discharge.  Continue PT during hospitalization. 5. DM: Continue SSI 6. Disp: He can be discharged once home health is arranged for wound care/ostomy care, IV antibiotics.  Will re-evaluate over next 24 hours for possible discharge tomorrow if otherwise doing well and to recheck wound.   LOS: 4 days   [name] 12/28/2019, 11:20 AM

## 2019-12-28 NOTE — Progress Notes (Signed)
Pt scheduled for TEE on Wednesday 12/30 at 11am at [facility].  [facility] transport set up for arrival time 0930-1000.  Bedside nurse Joelene Millin aware.    Vista Lawman, RN

## 2019-12-28 NOTE — Consult Note (Signed)
[place] Nurse ostomy follow up Stoma type/location: RUQ ileal conduit Stomal assessment/size: 1" Peristomal assessment: erythema Treatment options for stomal/peristomal skin: barrier ring and 1 piece convex pouch Output clear yellow urine  COnnected to bedside drainage Ostomy pouching: 1pc.convex Education provided: Demonstrated pouch change with midline incision present and how to avoid overlapping .  Wife at bedside and understands.  Discussed twice daily NS moist gauze dressing changes at home to surgical incision. Enrolled patient in [place] Discharge program: Yespreviously.  Will not follow at this time.  Please re-consult if needed.  Domenic Moras MSN, RN, FNP-BC CWON Wound, Ostomy, Continence Nurse Pager [phone]

## 2019-12-28 NOTE — Plan of Care (Signed)

## 2019-12-29 LAB — CBC
HCT: 29.2 % — ABNORMAL LOW (ref 39.0–52.0)
Hemoglobin: 8.7 g/dL — ABNORMAL LOW (ref 13.0–17.0)
MCH: 30.4 pg (ref 26.0–34.0)
MCHC: 29.8 g/dL — ABNORMAL LOW (ref 30.0–36.0)
MCV: 102.1 fL — ABNORMAL HIGH (ref 80.0–100.0)
Platelets: 270 10*3/uL (ref 150–400)
RBC: 2.86 MIL/uL — ABNORMAL LOW (ref 4.22–5.81)
RDW: 15.5 % (ref 11.5–15.5)
WBC: 5 10*3/uL (ref 4.0–10.5)
nRBC: 0 % (ref 0.0–0.2)

## 2019-12-29 LAB — BASIC METABOLIC PANEL
Anion gap: 8 (ref 5–15)
BUN: 14 mg/dL (ref 8–23)
CO2: 16 mmol/L — ABNORMAL LOW (ref 22–32)
Calcium: 7.8 mg/dL — ABNORMAL LOW (ref 8.9–10.3)
Chloride: 118 mmol/L — ABNORMAL HIGH (ref 98–111)
Creatinine, Ser: 1.16 mg/dL (ref 0.61–1.24)
GFR calc Af Amer: >60 mL/min (ref >60)
GFR calc non Af Amer: >60 mL/min (ref >60)
Glucose, Bld: 119 mg/dL — ABNORMAL HIGH (ref 70–99)
Potassium: 3.6 mmol/L (ref 3.5–5.1)
Sodium: 142 mmol/L (ref 135–145)

## 2019-12-29 LAB — GLUCOSE, CAPILLARY
Glucose-Capillary: 117 mg/dL — ABNORMAL HIGH (ref 70–99)
Glucose-Capillary: 113 mg/dL — ABNORMAL HIGH (ref 70–99)
Glucose-Capillary: 151 mg/dL — ABNORMAL HIGH (ref 70–99)
Glucose-Capillary: 113 mg/dL — ABNORMAL HIGH (ref 70–99)
Glucose-Capillary: 144 mg/dL — ABNORMAL HIGH (ref 70–99)

## 2019-12-29 NOTE — Progress Notes (Signed)
Physical Therapy Treatment Patient Details Name: Jimmy [name] MRN: [id number] DOB: 1946-12-07 Today's Date: 12/29/2019    History of Present Illness Pt is 73 yo male with PMH including prostate CA, invasive bladder CA s/p laparoscopic converted oped radical cystoprostatectomy with ileal conduit urinary diversion on 12/10/19.  Additionally with CAD, DM, obesity and see PMH for full H and P.  Pt presented with weakness, diarrhea, and drainage from abdominal incision. Admited with cdiff and AKI.    PT Comments    Pt assisted OOB however had to use BSC for BM and too fatigued to ambulate after using BSC. Pt encouraged to continue OOB to maintain strength.  Will continue to assist with mobility and hopefully ambulate next session.  Pt scheduled for TEE tomorrow.    Follow Up Recommendations  Home health PT;Supervision/Assistance - 24 hour     Equipment Recommendations  Other (comment)(wide RW if he didn't get one upon last admission)    Recommendations for Other Services       Precautions / Restrictions Precautions Precautions: Fall Precaution Comments: R UQ ileal conduit, diarrhea/c diff, increased drainage from incision    Mobility  Bed Mobility Overal bed mobility: Needs Assistance Bed Mobility: Supine to Sit;Sit to Supine     Supine to sit: Min assist;HOB elevated Sit to supine: Min assist   General bed mobility comments: increased time; HOB elevated; used bed rails; assist for R LE requested however improved once assist initiated  Transfers Overall transfer level: Needs assistance Equipment used: Rolling walker (2 wheeled) Transfers: Sit to/from [facility] Sit to Stand: Min assist;From elevated surface         General transfer comment: wide BOS, assist for stability however pt mostly min/guard, pt requesting BSC (also had just been cleaned by nursing for BMs), pt too tired to ambulate after having BM, inicision also with max drainage (leaking onto  seat and floor), although pt had initiated getting OOB to ambulate  Ambulation/Gait                 Stairs             Wheelchair Mobility    Modified Rankin (Stroke Patients Only)       Balance                                            Cognition Arousal/Alertness: Awake/alert Behavior During Therapy: WFL for tasks assessed/performed Overall Cognitive Status: Within Functional Limits for tasks assessed                                        Exercises      General Comments        Pertinent Vitals/Pain Pain Assessment: Faces Faces Pain Scale: Hurts little more Pain Location: Abdomen Pain Descriptors / Indicators: Sore;Grimacing;Guarding Pain Intervention(s): Monitored during session;Repositioned(RN brought pain meds end of session)    Home Living                      Prior Function            PT Goals (current goals can now be found in the care plan section) Progress towards PT goals: Progressing toward goals    Frequency    Min 3X/week  PT Plan Current plan remains appropriate    Co-evaluation              AM-PAC PT "6 Clicks" Mobility   Outcome Measure  Help needed turning from your back to your side while in a flat bed without using bedrails?: A Little Help needed moving from lying on your back to sitting on the side of a flat bed without using bedrails?: A Little Help needed moving to and from a bed to a chair (including a wheelchair)?: A Little Help needed standing up from a chair using your arms (e.g., wheelchair or bedside chair)?: A Little Help needed to walk in hospital room?: A Lot Help needed climbing 3-5 steps with a railing? : Total 6 Click Score: 15    End of Session Equipment Utilized During Treatment: Gait belt Activity Tolerance: Patient tolerated treatment well Patient left: in bed;with call bell/phone within reach;with bed alarm set;with nursing/sitter in  room;with family/visitor present Nurse Communication: Mobility status PT Visit Diagnosis: Difficulty in walking, not elsewhere classified (R26.2);Unsteadiness on feet (R26.81);Muscle weakness (generalized) (M62.81)     Time: [id number] PT Time Calculation (min) (ACUTE ONLY): 27 min  Charges:  $Therapeutic Activity: 23-37 mins                    Jannette Spanner PT, DPT Acute Rehabilitation Services Office: [phone]    Trena Platt 12/29/2019, 12:44 PM

## 2019-12-29 NOTE — Progress Notes (Signed)
    [facility] has been requested to perform a transesophageal echocardiogram on 12/30 for bacteremia.  After careful review of history and examination, the risks and benefits of transesophageal echocardiogram have been explained including risks of esophageal damage, perforation (1:10,000 risk), bleeding, pharyngeal hematoma as well as other potential complications associated with conscious sedation including aspiration, arrhythmia, respiratory failure and death. Alternatives to treatment were discussed, questions were answered. Patient is willing to proceed.   Scheduled for 12/30 at 11 am at [facility]. Orders written.  Rosaria Ferries, PA-C 12/29/2019 10:07 AM

## 2019-12-29 NOTE — Progress Notes (Signed)
Patient ID: Jimmy [name], male   DOB: Nov 29, 1946, 73 y.o.   MRN: [id number]    Subjective: Pt subjectively about the same.  Did not ambulate much yesterday.  Still with serous drainage from wound but less yesterday according to patient.  Objective: Vital signs in last 24 hours: Temp:  [97.7 F (36.5 C)-98.4 F (36.9 C)] 98.4 F (36.9 C) (12/29 0419) Pulse Rate:  [85-93] 93 (12/29 0419) Resp:  [18] 18 (12/29 0419) BP: (146-154)/(77-82) 146/77 (12/29 0419) SpO2:  [95 %-96 %] 95 % (12/29 0419)  Intake/Output from previous day: 12/28 0701 - 12/29 0700 In: 3768.9 [P.O.:240; I.V.:3209.5; IV Piggyback:319.3] Out: 1501 [Urine:1500; Stool:1] Intake/Output this shift: No intake/output data recorded.  Physical Exam:  General: Alert and oriented CV: RRR Lungs: Clear Abdomen: Soft, ND, ostomy pink, stents in place Incisions: Wound was deeply probed again.  I can feel PDS sutures but cannot detect any clear fascial dehiscence.  Wound repacked deeply (has been packed more superficially thus far) into superior deep aspect of wound. Ext: NT, No erythema  Lab Results: Recent Labs    12/29/19 0417  HGB 8.7*  HCT 29.2*   CBC Latest Ref Rng & Units 12/29/2019 12/26/2019 12/25/2019  WBC 4.0 - 10.5 K/uL 5.0 5.5 6.3  Hemoglobin 13.0 - 17.0 g/dL 8.7(L) 9.5(L) 8.4(L)  Hematocrit 39.0 - 52.0 % 29.2(L) 31.4(L) 27.9(L)  Platelets 150 - 400 K/uL 270 287 245     BMET Recent Labs    12/29/19 0417  NA 142  K 3.6  CL 118*  CO2 16*  GLUCOSE 119*  BUN 14  CREATININE 1.16  CALCIUM 7.8*     Assessment/Plan: Bacteremia/possible pyelonephritis s/p radical cystoprostatectomy and ileal conduit 12/10  1. ID: ID following for MSSA bactermia and urine culture. Pantoea growing in urine also that is being reincubated. On day 5 cefazolin IV.  Also on oral vancomycin (day 4) for c diff.  Pt scheduled for TEE tomorrow due to difficult TTE study. Will await final recommendations from ID for  antibiotics once that study is complete.   2. Renal: Renal function normalized. 3. Wound care: Continue wet to dry dressings but have asked nursing staff to ensure deep packing of superior wound.  Still do not detect any fascial dehiscence but will continue to evaluate for that possibility due to drainage. 4. Ambulate/PT. Expect this to improve with treatment of infection. He can resume home PT upon discharge.  Continue PT during hospitalization.  I encouraged patient to work with PT.  ? Whether he might require SNF upon discharge if he is not able to ambulate better. 5. DM: Continue SSI 6. Disp: Held with plans for TEE tomorrow and further aggressive wound care.  Will re-evaluate discharge plans tomorrow after that study.   LOS: 4 days   [name]  12/29/19  8:22 AM

## 2019-12-30 ENCOUNTER — Encounter (HOSPITAL_COMMUNITY)
Admission: EM | Disposition: A | Discharge status: Home Health | Payer: Self-pay | Source: Home / Self Care | Attending: Urology

## 2019-12-30 ENCOUNTER — Inpatient Hospital Stay (HOSPITAL_COMMUNITY): Payer: Medicare Other | Admitting: Certified Registered Nurse Anesthetist

## 2019-12-30 ENCOUNTER — Inpatient Hospital Stay (HOSPITAL_COMMUNITY): Payer: Medicare Other

## 2019-12-30 DIAGNOSIS — Z888 Allergy status to other drugs, medicaments and biological substances status: Secondary | ICD-10-CM

## 2019-12-30 DIAGNOSIS — Z881 Allergy status to other antibiotic agents status: Secondary | ICD-10-CM

## 2019-12-30 DIAGNOSIS — R7881 Bacteremia: Secondary | ICD-10-CM

## 2019-12-30 HISTORY — PX: BUBBLE STUDY: SHX6837

## 2019-12-30 HISTORY — PX: TEE WITHOUT CARDIOVERSION: SHX5443

## 2019-12-30 LAB — GLUCOSE, CAPILLARY
Glucose-Capillary: 112 mg/dL — ABNORMAL HIGH (ref 70–99)
Glucose-Capillary: 111 mg/dL — ABNORMAL HIGH (ref 70–99)
Glucose-Capillary: 114 mg/dL — ABNORMAL HIGH (ref 70–99)
Glucose-Capillary: 140 mg/dL — ABNORMAL HIGH (ref 70–99)
Glucose-Capillary: 135 mg/dL — ABNORMAL HIGH (ref 70–99)
Glucose-Capillary: 123 mg/dL — ABNORMAL HIGH (ref 70–99)

## 2019-12-30 LAB — CULTURE, BLOOD (ROUTINE X 2)
Culture: NO GROWTH
Culture: NO GROWTH
Special Requests: ADEQUATE
Special Requests: ADEQUATE

## 2019-12-30 SURGERY — ECHOCARDIOGRAM, TRANSESOPHAGEAL
Anesthesia: Monitor Anesthesia Care

## 2019-12-30 MED ORDER — PROPOFOL 500 MG/50ML IV EMUL
INTRAVENOUS | Status: DC | PRN
  Administered 2019-12-30: 100 ug/kg/min via INTRAVENOUS

## 2019-12-30 MED ORDER — PROPOFOL 10 MG/ML IV BOLUS
INTRAVENOUS | Status: DC | PRN
  Administered 2019-12-30 (×2): 10 mg via INTRAVENOUS
  Administered 2019-12-30: 20 mg via INTRAVENOUS

## 2019-12-30 MED ORDER — LIDOCAINE 2% (20 MG/ML) 5 ML SYRINGE
INTRAMUSCULAR | Status: DC | PRN
  Administered 2019-12-30: 60 mg via INTRAVENOUS

## 2019-12-30 MED ORDER — LACTATED RINGERS IV SOLN: INTRAVENOUS | Status: DC | PRN

## 2019-12-30 NOTE — H&P (Signed)
   INTERVAL PROCEDURE H&P  History and Physical Interval Note:  12/30/2019 10:15 AM  [name] has presented today for their planned procedure. The various methods of treatment have been discussed with the patient and family. After consideration of risks, benefits and other options for treatment, the patient has consented to the procedure.  The patients' outpatient history has been reviewed, patient examined, and no change in status from most recent office note within the past 30 days. I have reviewed the patients' chart and labs and will proceed as planned. Questions were answered to the patient's satisfaction.   Pixie Casino, MD, [facility], [place] Director of the Advanced Lipid Disorders &  Cardiovascular Risk Reduction Clinic Diplomate of the American Board of Clinical Lipidology Attending Cardiologist  Direct Dial: [phone]  Fax: [phone]  Website:  [email or web address] 12/30/2019, 10:15 AM

## 2019-12-30 NOTE — Progress Notes (Addendum)
Patient ID: Jimmy [name], male   DOB: Dec 04, 1946, 73 y.o.   MRN: [id number]    Subjective: Pt subjectively unchanged.  Still with some bowel urgency and incontinence due to inability to ambulate by himself and get to the toilet.  Otherwise, denies pain.  Still tolerating diet.  Scheduled for TEE today.  According to nurse, patient still having "leakage" in bed described as clear.  Wound dressing last changed 1.5 hours ago.  Objective: Vital signs in last 24 hours: Temp:  [97.7 F (36.5 C)-98 F (36.7 C)] 98 F (36.7 C) (12/30 0458) Pulse Rate:  [91-97] 91 (12/30 0458) Resp:  [18] 18 (12/30 0458) BP: (129-137)/(68-80) 137/80 (12/30 0458) SpO2:  [96 %] 96 % (12/30 0458)  Intake/Output from previous day: 12/29 0701 - 12/30 0700 In: 3240.3 [P.O.:120; I.V.:2739.8; IV Piggyback:380.5] Out: 2877 [Urine:2476; Stool:1] Intake/Output this shift: No intake/output data recorded.  Physical Exam:  General: Alert and oriented CV: RRR Lungs: Clear Abdomen: Soft, ND, ostomy pink with stents in place, urine clear Incisions: Wound continues to granulate well over inferior aspect and remains superficial.  Probing the superior wound, I again can fee PDS sutures from fascial closure but cannot penetrate fascial plan into peritoneal cavity with my finger or Q tip.  No purulence.  I cannot express any fluid this morning when attempting. Ext: NT, No erythema  Lab Results: Recent Labs    12/29/19 0417  HGB 8.7*  HCT 29.2*   CBC Latest Ref Rng & Units 12/29/2019 12/26/2019 12/25/2019  WBC 4.0 - 10.5 K/uL 5.0 5.5 6.3  Hemoglobin 13.0 - 17.0 g/dL 8.7(L) 9.5(L) 8.4(L)  Hematocrit 39.0 - 52.0 % 29.2(L) 31.4(L) 27.9(L)  Platelets 150 - 400 K/uL 270 287 245     BMET Recent Labs    12/29/19 0417  NA 142  K 3.6  CL 118*  CO2 16*  GLUCOSE 119*  BUN 14  CREATININE 1.16  CALCIUM 7.8*     Studies/Results:   Assessment/Plan: Bacteremia/possible pyelonephritis s/p radical cystoprostatectomy  and ileal conduit 12/1.   1. ID:ID following for MSSA bactermia and urine culture. Pantoea growing in urine also that is being reincubated. On day 6 cefazolin IV. Also on oral vancomycin (day 5) for c diff.  Pt scheduled for TEE this morning due to difficult TTE study. Will await final recommendations from ID for antibiotics once that study is complete.   2. Renal: Renal function normalized. 3. Wound care:Continue wet to dry dressings but have asked nursing staff to ensure deep packing of superior wound.  Still do not detect any fascial dehiscence but will continue to evaluate for that possibility due to drainage.  If he continues to have unexplained drainage, I will consider taking him to the OR to further explore wound more thoroughly to absolutely rule out the possibility of fascial dehiscence.  Continue dressing changes. 4. Ambulate/PT. Expect this to improve with treatment of infection. He can resume home PT upon discharge.Continue PT during hospitalization.  I encouraged patient to work with PT.  ? Whether he might require SNF upon discharge if he is not able to ambulate better. 5. DM: Continue SSI 6. DVT prophylaxis 7. Disp: Held with plans for TEE today and further aggressive wound care.   LOS: 6 days   [name] 12/30/2019, 7:38 AM

## 2019-12-30 NOTE — Anesthesia Procedure Notes (Signed)
Procedure Name: MAC Date/Time: 12/30/2019 10:27 AM Performed by: Janene Harvey, CRNA Pre-anesthesia Checklist: Patient identified, Emergency Drugs available, Suction available and Patient being monitored Oxygen Delivery Method: Nasal cannula Dental Injury: Teeth and Oropharynx as per pre-operative assessment

## 2019-12-30 NOTE — Progress Notes (Addendum)
      INFECTIOUS DISEASE ATTENDING ADDENDUM:   Date: 12/30/2019  Patient name: Jimmy [name]  Medical record number: [id number]  Date of birth: 07-19-1946   Diagnosis: MSSAB  Culture Result: MSSA  Allergies  Allergen Reactions  . Ace Inhibitors Cough  . Cephalexin Hives, Diarrhea and Nausea Only    Tolerates Ancef, Rocephin, Cefoxitin  . Crestor [Rosuvastatin Calcium]   . Lipitor [Atorvastatin Calcium] Other (See Comments)    MUSCLE PAINS   . Plavix [Clopidogrel Bisulfate] Hives    OPAT Orders Discharge antibiotics:  Cefazolin  ceftriaxone  Vancomycin per pharmacy protocol  Aim for Vancomycin trough 15-20 (unless otherwise indicated)   Duration:  2 weeks End Date:  January 7th   [place] Per Protocol:   Labs  weekly while on IV antibiotics: _x_ CBC with differential _x_ BMP w GFR/CMP     __ Please pull PIC at completion of IV antibiotics _x_ Please leave PIC in place until doctor has seen patient or been notified  Would defer to  Oncology if they want to use PICC for chemotherapy etc  Fax weekly labs to [phone]  Clinic Follow Up Appt:   [name] has an appointment on January 26th, @ 245 PM with Dr. Johnnye Sima  The [facility] for Infectious Disease is located in the [facility]  [place] in [place].  [address], which is located to the left of the elevators.  Phone: [phone]  Fax: [phone]  [email or web address]       Rhina Brackett Dam 12/30/2019, 6:13 PM

## 2019-12-30 NOTE — Progress Notes (Signed)
PHARMACY CONSULT NOTE FOR:  OUTPATIENT  PARENTERAL ANTIBIOTIC THERAPY (OPAT)  Indication: MSSA bacteremia Regimen: Ancef 2g IV q8 hr End date: 01/07/20  IV antibiotic discharge orders are pended. To discharging provider:  please sign these orders via discharge navigator,  Select New Orders & click on the button choice - Manage This Unsigned Work.     Thank you for allowing pharmacy to be a part of this patient's care.  Jimmy [name] A 12/30/2019, 6:50 PM

## 2019-12-30 NOTE — Progress Notes (Signed)
Patient to [facility] via [facility] for procedure. Cardiac monitor left in room, on monitoring for transport. Will monitor for return.

## 2019-12-30 NOTE — Progress Notes (Signed)
Subjective: No new complaints   Antibiotics:  Anti-infectives (From admission, onward)   Start     Dose/Rate Route Frequency Ordered Stop   12/26/19 1200  vancomycin (VANCOCIN) 50 mg/mL oral solution 125 mg     125 mg Oral Every 6 hours 12/26/19 1129     12/26/19 1115  vancomycin (VANCOCIN) 50 mg/mL oral solution 125 mg  Status:  Discontinued     125 mg Oral 4 times daily 12/26/19 1102 12/26/19 1128   12/26/19 0500  ceFAZolin (ANCEF) IVPB 2g/100 mL premix     2 g 200 mL/hr over 30 Minutes Intravenous Every 8 hours 12/25/19 1724     12/26/19 0430  cefTRIAXone (ROCEPHIN) 2 g in sodium chloride 0.9 % 100 mL IVPB  Status:  Discontinued    Note to Pharmacy: Patient tolerated cefoxitin perioperatively without significant reaction despite allergy list   2 g 200 mL/hr over 30 Minutes Intravenous Daily 12/25/19 0343 12/25/19 0421   12/25/19 0430  cefTRIAXone (ROCEPHIN) 2 g in sodium chloride 0.9 % 100 mL IVPB  Status:  Discontinued    Note to Pharmacy: Patient tolerated cefoxitin perioperatively without significant reaction despite allergy list   2 g 200 mL/hr over 30 Minutes Intravenous Daily 12/25/19 0421 12/25/19 1621   12/23/19 1900  cefTRIAXone (ROCEPHIN) 1 g in sodium chloride 0.9 % 100 mL IVPB  Status:  Discontinued    Note to Pharmacy: Patient tolerated cefoxitin perioperatively without significant reaction despite allergy list   1 g 200 mL/hr over 30 Minutes Intravenous Every 24 hours 12/23/19 1852 12/25/19 0343      Medications: Scheduled Meds: . Chlorhexidine Gluconate Cloth  6 each Topical Daily  . enoxaparin (LOVENOX) injection  40 mg Subcutaneous Q24H  . insulin aspart  0-15 Units Subcutaneous Q4H  . vancomycin  125 mg Oral Q6H   Continuous Infusions: . sodium chloride 125 mL/hr at 12/30/19 1400  .  ceFAZolin (ANCEF) IV 2 g (12/30/19 1544)   PRN Meds:.acetaminophen, diphenhydrAMINE **OR** diphenhydrAMINE, HYDROcodone-acetaminophen, HYDROmorphone (DILAUDID)  injection, lidocaine-EPINEPHrine, loperamide, ondansetron, sodium chloride flush, zolpidem    Objective: Weight change:   Intake/Output Summary (Last 24 hours) at 12/30/2019 1704 Last data filed at 12/30/2019 1400 Gross per 24 hour  Intake 3057.1 ml  Output 2627 ml  Net 430.1 ml   Blood pressure (!) 147/77, pulse 92, temperature 98.3 F (36.8 C), temperature source Oral, resp. rate 19, height 5\' 8"  (1.727 m), weight 115.2 kg, SpO2 97 %. Temp:  [98 F (36.7 C)-98.3 F (36.8 C)] 98.3 F (36.8 C) (12/30 1342) Pulse Rate:  [84-95] 92 (12/30 1342) Resp:  [10-19] 19 (12/30 1342) BP: (130-157)/(70-81) 147/77 (12/30 1342) SpO2:  [96 %-98 %] 97 % (12/30 1342) Weight:  [115.2 kg] 115.2 kg (12/30 1600)  Physical Exam: General: Alert and awake, oriented x3, not in any acute distress. HEENT: anicteric sclera, EOMI CVS regular rate, normal  Chest: , no wheezing, no respiratory distress Abdomen: soft non-distended,  Skin: no rashes Neuro: nonfocal  CBC:    BMET Recent Labs    12/29/19 0417  NA 142  K 3.6  CL 118*  CO2 16*  GLUCOSE 119*  BUN 14  CREATININE 1.16  CALCIUM 7.8*     Liver Panel  No results for input(s): PROT, ALBUMIN, AST, ALT, ALKPHOS, BILITOT, BILIDIR, IBILI in the last 72 hours.     Sedimentation Rate No results for input(s): ESRSEDRATE in the last 72 hours. C-Reactive Protein No results for  input(s): CRP in the last 72 hours.  Micro Results: Recent Results (from the past 720 hour(s))  Novel Coronavirus, NAA (Hosp order, Send-out to Ref Lab; TAT 18-24 hrs     Status: None   Collection Time: 12/07/19 10:00 AM   Specimen: Nasopharyngeal Swab; Respiratory  Result Value Ref Range Status   SARS-CoV-2, NAA NOT DETECTED NOT DETECTED Final    Comment: (NOTE) This nucleic acid amplification test was developed and its performance characteristics determined by [employer]. Nucleic acid amplification tests include PCR and TMA. This test  has not been FDA cleared or approved. This test has been authorized by FDA under an Emergency Use Authorization (EUA). This test is only authorized for the duration of time the declaration that circumstances exist justifying the authorization of the emergency use of in vitro diagnostic tests for detection of SARS-CoV-2 virus and/or diagnosis of COVID-19 infection under section 564(b)(1) of the Act, 21 U.S.C. [id number]) (1), unless the authorization is terminated or revoked sooner. When diagnostic testing is negative, the possibility of a false negative result should be considered in the context of a patient's recent exposures and the presence of clinical signs and symptoms consistent with COVID-19. An individual without symptoms of COVID- 19 and who is not shedding SARS-CoV-2 vi rus would expect to have a negative (not detected) result in this assay. Performed At: [facility] [address] [place], [place] [id number] Rush Farmer MD [id number]    [place]  Final    Comment: Performed at [facility], [place] [address], [place], [place] [address]  MRSA PCR Screening     Status: None   Collection Time: 12/10/19  8:40 PM   Specimen: Nasal Mucosa; Nasopharyngeal  Result Value Ref Range Status   MRSA by PCR NEGATIVE NEGATIVE Final    Comment:        The GeneXpert MRSA Assay (FDA approved for NASAL specimens only), is one component of a comprehensive MRSA colonization surveillance program. It is not intended to diagnose MRSA infection nor to guide or monitor treatment for MRSA infections. Performed at [facility], [place] [address], [place], [place] [address]   Urine culture     Status: Abnormal (Preliminary result)   Collection Time: 12/23/19  2:36 PM   Specimen: In/Out Cath Urine  Result Value Ref Range Status   Specimen Description   Final    IN/OUT CATH URINE Performed at [facility]  [address], [place], [place] [address]    Special Requests   Final    NONE Performed at [facility], [place] [address] [place], [place] [address]    Culture (A)  Final    >=100,000 COLONIES/mL PANTOEA SPECIES CULTURE REINCUBATED FOR BETTER GROWTH >=100,000 COLONIES/mL STAPHYLOCOCCUS AUREUS ORG 1 IDENTIFICATION AND SUSCEPTIBILITIES TO FOLLOW Performed at [employer] Performed at [facility], [address], [place], [place] [address]    Report Status PENDING  Incomplete   Organism ID, Bacteria STAPHYLOCOCCUS AUREUS (A)  Final      Susceptibility   Staphylococcus aureus - MIC*    CIPROFLOXACIN <=0.5 SENSITIVE Sensitive     GENTAMICIN <=0.5 SENSITIVE Sensitive     NITROFURANTOIN <=16 SENSITIVE Sensitive     OXACILLIN <=0.25 SENSITIVE Sensitive     TETRACYCLINE <=1 SENSITIVE Sensitive     VANCOMYCIN 1 SENSITIVE Sensitive     TRIMETH/SULFA <=10 SENSITIVE Sensitive     CLINDAMYCIN <=0.25 SENSITIVE Sensitive     RIFAMPIN <=0.5  SENSITIVE Sensitive     Inducible Clindamycin NEGATIVE Sensitive     * >=100,000 COLONIES/mL STAPHYLOCOCCUS AUREUS  Blood Culture (routine x 2)     Status: Abnormal   Collection Time: 12/23/19  2:49 PM   Specimen: Left Antecubital; Blood  Result Value Ref Range Status   Specimen Description   Final    LEFT ANTECUBITAL Performed at [facility] [address] [place], [place] [address]    Special Requests   Final    BOTTLES DRAWN AEROBIC AND ANAEROBIC Blood Culture adequate volume Performed at [facility] [address] [place], [place] [address]    Culture  Setup Time   Final    AEROBIC BOTTLE ONLY GRAM POSITIVE COCCI CRITICAL RESULT CALLED TO, READ BACK BY AND VERIFIED WITH: B GREEN PHARMD 12/24/19 2215 JDW Performed at [facility], [place] [address], [place], [place] [address]    Culture STAPHYLOCOCCUS AUREUS (A)  Final   Report Status 12/26/2019 FINAL  Final   Organism ID,  Bacteria STAPHYLOCOCCUS AUREUS  Final      Susceptibility   Staphylococcus aureus - MIC*    CIPROFLOXACIN <=0.5 SENSITIVE Sensitive     ERYTHROMYCIN <=0.25 SENSITIVE Sensitive     GENTAMICIN <=0.5 SENSITIVE Sensitive     OXACILLIN <=0.25 SENSITIVE Sensitive     TETRACYCLINE <=1 SENSITIVE Sensitive     VANCOMYCIN <=0.5 SENSITIVE Sensitive     TRIMETH/SULFA <=10 SENSITIVE Sensitive     CLINDAMYCIN <=0.25 SENSITIVE Sensitive     RIFAMPIN <=0.5 SENSITIVE Sensitive     Inducible Clindamycin NEGATIVE Sensitive     * STAPHYLOCOCCUS AUREUS  Aerobic/Anaerobic Culture (surgical/deep wound)     Status: None   Collection Time: 12/23/19  3:52 PM   Specimen: Wound  Result Value Ref Range Status   Specimen Description   Final    WOUND ABDOMEN Performed at [facility] [address], [place], [place] [address]    Special Requests   Final    NONE Performed at [facility], [place] [address] [place], [place] [address]    Gram Stain NO WBC SEEN NO ORGANISMS SEEN   Final   Culture   Final    FEW ESCHERICHIA COLI FEW STAPHYLOCOCCUS AUREUS NO ANAEROBES ISOLATED Performed at [facility], [place] [address] [place], [place] [address]    Report Status 12/28/2019 FINAL  Final   Organism ID, Bacteria ESCHERICHIA COLI  Final   Organism ID, Bacteria STAPHYLOCOCCUS AUREUS  Final      Susceptibility   Escherichia coli - MIC*    AMPICILLIN >=32 RESISTANT Resistant     CEFAZOLIN <=4 SENSITIVE Sensitive     CEFEPIME <=1 SENSITIVE Sensitive     CEFTAZIDIME <=1 SENSITIVE Sensitive     CEFTRIAXONE <=1 SENSITIVE Sensitive     CIPROFLOXACIN <=0.25 SENSITIVE Sensitive     GENTAMICIN <=1 SENSITIVE Sensitive     IMIPENEM <=0.25 SENSITIVE Sensitive     TRIMETH/SULFA <=20 SENSITIVE Sensitive     AMPICILLIN/SULBACTAM >=32 RESISTANT Resistant     PIP/TAZO <=4 SENSITIVE Sensitive     * FEW ESCHERICHIA COLI   Staphylococcus aureus - MIC*    CIPROFLOXACIN <=0.5 SENSITIVE  Sensitive     ERYTHROMYCIN <=0.25 SENSITIVE Sensitive     GENTAMICIN <=0.5 SENSITIVE Sensitive     OXACILLIN <=0.25 SENSITIVE Sensitive     TETRACYCLINE <=1 SENSITIVE Sensitive     VANCOMYCIN <=0.5 SENSITIVE Sensitive     TRIMETH/SULFA <=10  SENSITIVE Sensitive     CLINDAMYCIN <=0.25 SENSITIVE Sensitive     RIFAMPIN <=0.5 SENSITIVE Sensitive     Inducible Clindamycin NEGATIVE Sensitive     * FEW STAPHYLOCOCCUS AUREUS  SARS CORONAVIRUS 2 (TAT 6-24 HRS) Nasopharyngeal Nasopharyngeal Swab     Status: None   Collection Time: 12/23/19  4:55 PM   Specimen: Nasopharyngeal Swab  Result Value Ref Range Status   SARS Coronavirus 2 NEGATIVE NEGATIVE Final    Comment: (NOTE) SARS-CoV-2 target nucleic acids are NOT DETECTED. The SARS-CoV-2 RNA is generally detectable in upper and lower respiratory specimens during the acute phase of infection. Negative results do not preclude SARS-CoV-2 infection, do not rule out co-infections with other pathogens, and should not be used as the sole basis for treatment or other patient management decisions. Negative results must be combined with clinical observations, patient history, and epidemiological information. The expected result is Negative. Fact Sheet for Patients: SugarRoll.be Fact Sheet for Healthcare Providers: https://www.woods-mathews.com/ This test is not yet approved or cleared by the [place] FDA and  has been authorized for detection and/or diagnosis of SARS-CoV-2 by FDA under an Emergency Use Authorization (EUA). This EUA will remain  in effect (meaning this test can be used) for the duration of the COVID-19 declaration under Section 56 4(b)(1) of the Act, 21 U.S.C. section 360bbb-3(b)(1), unless the authorization is terminated or revoked sooner. Performed at [facility], [place] [address] [place], [place] [address]   Blood Culture (routine x 2)     Status: Abnormal   Collection Time:  12/23/19  7:11 PM   Specimen: BLOOD  Result Value Ref Range Status   Specimen Description   Final    BLOOD BLOOD RIGHT HAND Performed at [facility] [address] [place], [place] [address]    Special Requests   Final    BOTTLES DRAWN AEROBIC ONLY Blood Culture adequate volume Performed at [facility] [address] [place], [place] [address]    Culture  Setup Time   Final    AEROBIC BOTTLE ONLY GRAM POSITIVE COCCI CRITICAL RESULT CALLED TO, READ BACK BY AND VERIFIED WITH: B GREEN,PHARMD AT 0326 12/25/2019 BY L BENFIELD    Culture (A)  Final    STAPHYLOCOCCUS AUREUS SUSCEPTIBILITIES PERFORMED ON PREVIOUS CULTURE WITHIN THE LAST 5 DAYS. Performed at [facility], [place] [address], [place], [place] [address]    Report Status 12/26/2019 FINAL  Final  Blood Culture ID Panel (Reflexed)     Status: Abnormal   Collection Time: 12/23/19  7:11 PM  Result Value Ref Range Status   Enterococcus species NOT DETECTED NOT DETECTED Final   Listeria monocytogenes NOT DETECTED NOT DETECTED Final   Staphylococcus species DETECTED (A) NOT DETECTED Final    Comment: CRITICAL RESULT CALLED TO, READ BACK BY AND VERIFIED WITH: B GREEN,PHARMD AT 0325 12/25/2019 BY L BENFIELD    Staphylococcus aureus (BCID) DETECTED (A) NOT DETECTED Final    Comment: Methicillin (oxacillin) susceptible Staphylococcus aureus (MSSA). Preferred therapy is anti staphylococcal beta lactam antibiotic (Cefazolin or Nafcillin), unless clinically contraindicated. CRITICAL RESULT CALLED TO, READ BACK BY AND VERIFIED WITH: B GREEN,PHARMD AT 0325 12/25/2019 BY L BENFIELD    Methicillin resistance NOT DETECTED NOT DETECTED Final   Streptococcus species NOT DETECTED NOT DETECTED Final   Streptococcus agalactiae NOT DETECTED NOT DETECTED Final   Streptococcus pneumoniae NOT DETECTED NOT DETECTED Final   Streptococcus pyogenes NOT DETECTED NOT DETECTED Final   Acinetobacter  baumannii  NOT DETECTED NOT DETECTED Final   Enterobacteriaceae species NOT DETECTED NOT DETECTED Final   Enterobacter cloacae complex NOT DETECTED NOT DETECTED Final   Escherichia coli NOT DETECTED NOT DETECTED Final   Klebsiella oxytoca NOT DETECTED NOT DETECTED Final   Klebsiella pneumoniae NOT DETECTED NOT DETECTED Final   Proteus species NOT DETECTED NOT DETECTED Final   Serratia marcescens NOT DETECTED NOT DETECTED Final   Haemophilus influenzae NOT DETECTED NOT DETECTED Final   Neisseria meningitidis NOT DETECTED NOT DETECTED Final   Pseudomonas aeruginosa NOT DETECTED NOT DETECTED Final   Candida albicans NOT DETECTED NOT DETECTED Final   Candida glabrata NOT DETECTED NOT DETECTED Final   Candida krusei NOT DETECTED NOT DETECTED Final   Candida parapsilosis NOT DETECTED NOT DETECTED Final   Candida tropicalis NOT DETECTED NOT DETECTED Final    Comment: Performed at [facility], [place] [address], [place], [place] [address]  C difficile quick scan w PCR reflex     Status: Abnormal   Collection Time: 12/25/19  1:32 PM   Specimen: STOOL  Result Value Ref Range Status   C Diff antigen POSITIVE (A) NEGATIVE Final   C Diff toxin NEGATIVE NEGATIVE Final   C Diff interpretation Results are indeterminate. See PCR results.  Final    Comment: Performed at [facility], [place] [address] [place], [place] [address]  C. Diff by PCR, Reflexed     Status: Abnormal   Collection Time: 12/25/19  1:32 PM  Result Value Ref Range Status   Toxigenic C. Difficile by PCR POSITIVE (A) NEGATIVE Final    Comment: Positive for toxigenic C. difficile with little to no toxin production. Only treat if clinical presentation suggests symptomatic illness. Performed at [facility], [place] [address], [place], [place] [address]   Culture, blood (routine x 2)     Status: None   Collection Time: 12/25/19  9:06 PM   Specimen: BLOOD  Result Value Ref Range Status   Specimen Description   Final     BLOOD RIGHT ANTECUBITAL Performed at [facility] [address] [place], [place] [address]    Special Requests   Final    BOTTLES DRAWN AEROBIC ONLY Blood Culture adequate volume Performed at [facility] [address] [place], [place] [address]    Culture   Final    NO GROWTH 5 DAYS Performed at [facility], [place] [address], [place], [place] [address]    Report Status 12/30/2019 FINAL  Final  Culture, blood (routine x 2)     Status: None   Collection Time: 12/25/19  9:06 PM   Specimen: BLOOD RIGHT HAND  Result Value Ref Range Status   Specimen Description   Final    BLOOD RIGHT HAND Performed at [facility] [address], [place], [place] [address]    Special Requests   Final    BOTTLES DRAWN AEROBIC ONLY Blood Culture adequate volume Performed at [facility] [address], [place], [place] [address]    Culture   Final    NO GROWTH 5 DAYS Performed at [facility], [place] [address] [place], [place] [address]    Report Status 12/30/2019 FINAL  Final    Studies/Results: ECHO TEE  Result Date: 12/30/2019   TRANSESOPHOGEAL ECHO REPORT   Patient Name:   Jimmy [name] Date of Exam: 12/30/2019 Medical Rec #:  [id number]  Height: Accession #:    [id number]       Weight: Date of Birth:  February 02, 1946         BSA: Patient Age:    9 years         BP:           141/70 mmHg Patient Gender: M                HR:           88 bpm. Exam Location:  Inpatient  Procedure: 2D Echo Indications:     Bacteremia  History:         Patient has prior history of Echocardiogram examinations, most                  recent 12/28/2019.  Sonographer:     Clayton Lefort RDCS (AE) Referring Phys:  1993 [facility] G BARRETT Diagnosing Phys: Lyman Bishop MD  PROCEDURE: No evidence of vegetation. After discussion of the risks and benefits of a TEE, an informed consent was obtained from the patient. Patients was monitored  while under deep sedation. The transesophogeal probe was passed through the esophogus of the patient. Imaged were obtained with the patient in a supine position. Image quality was excellent. The patient developed no complications during the procedure. IMPRESSIONS  1. Left ventricular ejection fraction, by visual estimation, is 60 to 65%. The left ventricle has normal function. There is no left ventricular hypertrophy.  2. The left ventricle has no regional wall motion abnormalities.  3. Global right ventricle has normal systolic function.The right ventricular size is normal. No increase in right ventricular wall thickness.  4. Left atrial size was normal.  5. Right atrial size was normal.  6. The mitral valve is grossly normal. Trivial mitral valve regurgitation.  7. The tricuspid valve is grossly normal.  8. The aortic valve is tricuspid. Aortic valve regurgitation is not visualized.  9. The pulmonic valve was grossly normal. Pulmonic valve regurgitation is not visualized. 10. Small patent foramen ovale with predominantly right to left shunting across the atrial septum. 11. Evidence of atrial level shunting detected by color flow Doppler. 12. No evidence of any gross valvular vegetations. FINDINGS  Left Ventricle: Left ventricular ejection fraction, by visual estimation, is 60 to 65%. The left ventricle has normal function. The left ventricle has no regional wall motion abnormalities. There is no left ventricular hypertrophy. Right Ventricle: The right ventricular size is normal. No increase in right ventricular wall thickness. Global RV systolic function is has normal systolic function. Left Atrium: Left atrial size was normal in size. Right Atrium: Right atrial size was normal in size Pericardium: There is no evidence of pericardial effusion. Mitral Valve: The mitral valve is grossly normal. Trivial mitral valve regurgitation. Tricuspid Valve: The tricuspid valve is grossly normal. Tricuspid valve regurgitation  is trivial. Aortic Valve: The aortic valve is tricuspid. Aortic valve regurgitation is not visualized. Pulmonic Valve: The pulmonic valve was grossly normal. Pulmonic valve regurgitation is not visualized. Aorta: The aortic root and ascending aorta are structurally normal, with no evidence of dilitation. Shunts: Agitated saline contrast was given intravenously to evaluate for intracardiac shunting. Saline shunting was observed within 3-6 cardiac cycles suggestive of interatrial shunt. A small patent foramen ovale is detected with predominantly right to left shunting across the atrial septum. Evidence of atrial level shunting detected by color flow Doppler.  Lyman Bishop MD Electronically signed by Lyman Bishop MD Signature Date/Time: 12/30/2019/11:28:40 AM  Final       Assessment/Plan:  INTERVAL HISTORY: TEE without vegetations   Active Problems:   Bladder cancer (HCC)    NIO GUTTILLA is a 73 y.o. male with  Bladder cancer, MSSAB with porta-cath which has been removed. He also has CDI. His TEE is negative for endocarditis and he lacks evidence for deep infection w MSSA.  #1 MSSAB: seems to be uncomplicated and likely associated with port  Will treat with 2 weeks IV ancef  #2  CDI:  Will give 4 weeks po vancomycin  I will arrange for HSFU in clinic with [place].  I will otherwise sign off   Please call with further questions.    LOS: 6 days   Alcide Evener 12/30/2019, 5:04 PM

## 2019-12-30 NOTE — Progress Notes (Signed)
  Echocardiogram Echocardiogram Transesophageal has been performed.  Jimmy [name] 12/30/2019, 11:06 AM

## 2019-12-30 NOTE — Anesthesia Postprocedure Evaluation (Signed)
Anesthesia Post Note  Patient: Jimmy [name]  Procedure(s) Performed: TRANSESOPHAGEAL ECHOCARDIOGRAM (TEE) (N/A ) BUBBLE STUDY     Patient location during evaluation: PACU Anesthesia Type: MAC Level of consciousness: awake and alert Pain management: pain level controlled Vital Signs Assessment: post-procedure vital signs reviewed and stable Respiratory status: spontaneous breathing, nonlabored ventilation, respiratory function stable and patient connected to nasal cannula oxygen Cardiovascular status: stable and blood pressure returned to baseline Postop Assessment: no apparent nausea or vomiting Anesthetic complications: no    Last Vitals:  Vitals:   12/30/19 1120 12/30/19 1130  BP: (!) 150/71 (!) 149/72  Pulse: 85 85  Resp: 10 16  Temp:    SpO2: 97% 97%    Last Pain:  Vitals:   12/30/19 1057  TempSrc: Oral  PainSc: 0-No pain                 Effie Berkshire

## 2019-12-30 NOTE — CV Procedure (Signed)
   TRANSESOPHAGEAL ECHOCARDIOGRAM (TEE) NOTE  INDICATIONS: infective endocarditis  PROCEDURE:   Informed consent was obtained prior to the procedure. The risks, benefits and alternatives for the procedure were discussed and the patient comprehended these risks.  Risks include, but are not limited to, cough, sore throat, vomiting, nausea, somnolence, esophageal and stomach trauma or perforation, bleeding, low blood pressure, aspiration, pneumonia, infection, trauma to the teeth and death.    After a procedural time-out, the patient was given propofol per anesthesia for sedation.  The patient's heart rate, blood pressure, and oxygen saturation are monitored continuously during the procedure. The transesophageal probe was inserted in the esophagus and stomach without difficulty and multiple views were obtained.  The patient was kept under observation until the patient left the procedure room. The patient left the procedure room in stable condition.   Agitated microbubble saline contrast was administered.  COMPLICATIONS:    There were no immediate complications.  Findings:  1. LEFT VENTRICLE: The left ventricular wall thickness is normal.  The left ventricular cavity is normal in size. Wall motion is normal.  LVEF is 60-65%.  2. RIGHT VENTRICLE:  The right ventricle is normal in structure and function without any thrombus or masses.    3. LEFT ATRIUM:  The left atrium is normal in size without any thrombus or masses.  There is not spontaneous echo contrast ("smoke") in the left atrium consistent with a low flow state.  4. LEFT ATRIAL APPENDAGE:  The left atrial appendage is free of any thrombus or masses. The appendage has single lobes. Pulse doppler indicates moderate flow in the appendage.  5. ATRIAL SEPTUM:  The atrial septum is aneurysmal. There is a small PFO with right to left shunting by saline microbubble contrast.  6. RIGHT ATRIUM:  The right atrium is normal in size and function  without any thrombus or masses.  7. MITRAL VALVE:  The mitral valve is normal in structure and function with trivial regurgitation.  There were no vegetations or stenosis.  8. AORTIC VALVE:  The aortic valve is trileaflet, normal in structure and function with no regurgitation.  There were no vegetations or stenosis  9. TRICUSPID VALVE:  The tricuspid valve is normal in structure and function with trivial regurgitation.  There were no vegetations or stenosis  10.  PULMONIC VALVE:  The pulmonic valve is normal in structure and function with no regurgitation.  There were no vegetations or stenosis.   11. AORTIC ARCH, ASCENDING AND DESCENDING AORTA:  There was grade 1 Ron Parker et. Al, 1992) atherosclerosis of the ascending aorta, aortic arch, or proximal descending aorta.  12. PULMONARY VEINS: Anomalous pulmonary venous return was not noted.  13. PERICARDIUM: The pericardium appeared normal and non-thickened.  There is no pericardial effusion.  IMPRESSION:   1. No endocarditis. 2. Aneurysmal interatrial septum with small PFO and right to left flow by saline microbubble contrast. 3. No LAA thrombus 4. Normal biatrial size 5. LVEF 60-65% with normal wall motion.  RECOMMENDATIONS:    1.  Antibiotics for bacteremia per ID recs.  Time Spent Directly with the Patient:  45 minutes   Pixie Casino, MD, [facility], [place] Director of the [facility] Lipid [facility] &  Cardiovascular Risk Reduction Clinic Diplomate of the American Board of Clinical Lipidology Attending Cardiologist  Direct Dial: [phone]  Fax: [phone]  Website:  [email or web address] Osgood Asherah Lavoy 12/30/2019, 10:57 AM

## 2019-12-30 NOTE — Anesthesia Preprocedure Evaluation (Signed)
Anesthesia Evaluation  Patient identified by MRN, date of birth, ID band Patient awake    Reviewed: Allergy & Precautions, NPO status , Patient's Chart, lab work & pertinent test results  Airway Mallampati: II       Dental  (+) Teeth Intact, Dental Advisory Given   Pulmonary former smoker,    breath sounds clear to auscultation       Cardiovascular hypertension, + CAD and + Peripheral Vascular Disease   Rhythm:Regular Rate:Normal     Neuro/Psych    GI/Hepatic negative GI ROS, Neg liver ROS,   Endo/Other  diabetes  Renal/GU      Musculoskeletal   Abdominal (+) + obese,   Peds  Hematology   Anesthesia Other Findings   Reproductive/Obstetrics                             Echo:  1. Left ventricular ejection fraction, by visual estimation, is 65 to 70%. The left ventricle has hyperdynamic function. There is no left ventricular hypertrophy.  2. Left ventricular diastolic parameters are consistent with Grade I diastolic dysfunction (impaired relaxation).  3. The left ventricle has no regional wall motion abnormalities.  4. Global right ventricle has normal systolic function.The right ventricular size is normal. No increase in right ventricular wall thickness.  5. Left atrial size was normal.  6. Right atrial size was normal.  7. The mitral valve is abnormal. Trivial mitral valve regurgitation.  8. The tricuspid valve is not well visualized.  9. The aortic valve was not well visualized. Aortic valve regurgitation is not visualized. 10. The pulmonic valve was grossly normal. Pulmonic valve regurgitation is not visualized. 11. The inferior vena cava is normal in size with greater than 50% respiratory variability, suggesting right atrial pressure of 3 mmHg. 12. The interatrial septum was not assessed. 13. Technically difficult study, suboptimal valve visualization but no obvious large valve  vegetations.  Anesthesia Physical Anesthesia Plan  ASA: III  Anesthesia Plan: MAC   Post-op Pain Management:    Induction: Intravenous  PONV Risk Score and Plan: 0 and Treatment may vary due to age or medical condition and Propofol infusion  Airway Management Planned: Simple Face Mask and Nasal Cannula  Additional Equipment: None  Intra-op Plan:   Post-operative Plan:   Informed Consent: I have reviewed the patients History and Physical, chart, labs and discussed the procedure including the risks, benefits and alternatives for the proposed anesthesia with the patient or authorized representative who has indicated his/her understanding and acceptance.       Plan Discussed with: CRNA  Anesthesia Plan Comments:         Anesthesia Quick Evaluation

## 2019-12-30 NOTE — Transfer of Care (Signed)
Immediate Anesthesia Transfer of Care Note  Patient: Jimmy [name]  Procedure(s) Performed: TRANSESOPHAGEAL ECHOCARDIOGRAM (TEE) (N/A ) BUBBLE STUDY  Patient Location: Endoscopy Unit  Anesthesia Type:MAC  Level of Consciousness: drowsy  Airway & Oxygen Therapy: Patient Spontanous Breathing and Patient connected to nasal cannula oxygen  Post-op Assessment: Report given to RN and Post -op Vital signs reviewed and stable  Post vital signs: Reviewed  Last Vitals:  Vitals Value Taken Time  BP 141/70 12/30/19 1057  Temp    Pulse 87 12/30/19 1059  Resp 20 12/30/19 1059  SpO2 98 % 12/30/19 1059  Vitals shown include unvalidated device data.  Last Pain:  Vitals:   12/30/19 1057  TempSrc:   PainSc: (P) 0-No pain      Patients Stated Pain Goal: 3 (94/50/38 8828)  Complications: No apparent anesthesia complications

## 2019-12-30 NOTE — Progress Notes (Signed)
VAST consulted to saline lock PICC. Arrived at pt's room and advised that patient has been picked up for transfer to [facility] for procedure.

## 2019-12-30 NOTE — TOC Initial Note (Signed)
Transition of Care [facility]) - Initial/Assessment Note    Patient Details  Name: Jimmy [name] MRN: [id number] Date of Birth: [birth year]  Transition of Care [facility]) CM/SW Contact:    Wende Neighbors, LCSW Phone Number: 12/30/2019, 3:23 PM  Clinical Narrative:     Spoke with spouse at bedside as patient slept. Spouse stated she has a lot of support from family and neighbors to help her with patients care. Spouse agreeable to have [facility] come into the home and follow patient for needs. CSW set up [facility] to help with physical therapy and nursing needs at discharge                  Patient Goals and CMS Choice        Expected Discharge Plan and Services           Expected Discharge Date: (unknown)                                    Prior Living Arrangements/Services                       Activities of Daily Living Home Assistive Devices/Equipment: CBG Meter, CPAP, Walker (specify type), Eyeglasses, Hearing aid ADL Screening (condition at time of admission) Patient's cognitive ability adequate to safely complete daily activities?: Yes Is the patient deaf or have difficulty hearing?: Yes(wears hearing aids) Does the patient have difficulty seeing, even when wearing glasses/contacts?: No Does the patient have difficulty concentrating, remembering, or making decisions?: No Patient able to express need for assistance with ADLs?: Yes Does the patient have difficulty dressing or bathing?: Yes Independently performs ADLs?: No Communication: Independent Dressing (OT): Needs assistance Is this a change from baseline?: Pre-admission baseline Grooming: Needs assistance Is this a change from baseline?: Pre-admission baseline Feeding: Needs assistance Is this a change from baseline?: Pre-admission baseline Bathing: Needs assistance Is this a change from baseline?: Pre-admission baseline Toileting: Needs assistance Is this a change from baseline?:  Pre-admission baseline In/Out Bed: Needs assistance Is this a change from baseline?: Pre-admission baseline Walks in Home: Needs assistance Is this a change from baseline?: Pre-admission baseline Does the patient have difficulty walking or climbing stairs?: Yes(secondary to weakness) Weakness of Legs: Both Weakness of Arms/Hands: None  Permission Sought/Granted                  Emotional Assessment              Admission diagnosis:  Dehydration [E86.0] Bladder cancer (HCC) [C67.9] AKI (acute kidney injury) ([facility]) [N17.9] Abdominal wall seroma, initial encounter [S30.1XXA] Patient Active Problem List   Diagnosis Date Noted  . Bladder cancer ([place]) 12/10/2019  . Port-A-Cath in place 08/07/2019  . Goals of care, counseling/discussion 07/24/2019  . Malignant neoplasm of urinary bladder ([place]) 07/24/2019  . Senile purpura ([place]) 06/03/2019  . Solitary pulmonary nodule present on computed tomography of lung 04/02/2018  . Type II diabetes mellitus with complication ([place]) [id number]  . Osteoporosis due to androgen therapy 09/16/2012  . Coronary artery disease involving native coronary artery of native heart without angina pectoris 08/29/2011  . Morbid obesity ([place]) 08/29/2011  . History of prostate cancer 08/29/2011  . Sleep apnea 08/29/2011  . Hypertension associated with type 2 diabetes mellitus ([place]) 08/29/2011  . Hyperlipidemia associated with type 2 diabetes mellitus ([place]) 08/29/2011   PCP:  [name], MD Pharmacy:  [id number] [address], [place] [address] [place] [address] Phone: [phone] Fax: [phone]     Social Determinants of Health (SDOH) Interventions    Readmission Risk Interventions No flowsheet data found.

## 2019-12-31 ENCOUNTER — Inpatient Hospital Stay (HOSPITAL_COMMUNITY): Payer: Medicare Other

## 2019-12-31 LAB — GLUCOSE, CAPILLARY
Glucose-Capillary: 110 mg/dL — ABNORMAL HIGH (ref 70–99)
Glucose-Capillary: 110 mg/dL — ABNORMAL HIGH (ref 70–99)
Glucose-Capillary: 105 mg/dL — ABNORMAL HIGH (ref 70–99)
Glucose-Capillary: 122 mg/dL — ABNORMAL HIGH (ref 70–99)
Glucose-Capillary: 119 mg/dL — ABNORMAL HIGH (ref 70–99)
Glucose-Capillary: 117 mg/dL — ABNORMAL HIGH (ref 70–99)
Glucose-Capillary: 109 mg/dL — ABNORMAL HIGH (ref 70–99)

## 2019-12-31 MED ORDER — IOHEXOL 300 MG/ML  SOLN
100.0000 mL | Freq: Once | INTRAMUSCULAR | Status: AC | PRN
  Administered 2019-12-31: 100 mL via INTRAVENOUS

## 2019-12-31 MED ORDER — SODIUM CHLORIDE (PF) 0.9 % IJ SOLN
INTRAMUSCULAR | Status: AC
  Filled 2019-12-31: qty 50

## 2019-12-31 MED ORDER — CEFAZOLIN IV (FOR PTA / DISCHARGE USE ONLY): 2.0000 g | Freq: Three times a day (TID) | INTRAVENOUS | 0 refills | Status: AC

## 2019-12-31 NOTE — Progress Notes (Addendum)
  CT reviewed.  No obvious dehiscence and fascia seems to be mostly intact with no bowel protruding, etc.  Discussed results with patient and wife. We have agreed to avoid OR for now and proceed with dressing changes and wound healing.  Will plan for d/c once all home health services are arranged.  I will evaluate in AM tomorrow to ensure no change in wound status.  Stents were removed as well.  No ostomy supplies were around so I did not change bag but ok to remove stents with next ostomy change.

## 2019-12-31 NOTE — Progress Notes (Signed)
1 Day Post-Op Subjective: Pt without new complaints.  Still with drainage from penis or wound (serous).  TEE yesterday without evidence for endocarditis.  Objective: Vital signs in last 24 hours: Temp:  [97.6 F (36.4 C)-98.3 F (36.8 C)] 97.7 F (36.5 C) (12/31 0522) Pulse Rate:  [84-93] 90 (12/31 0522) Resp:  [10-20] 20 (12/31 0522) BP: (141-160)/(70-87) 142/87 (12/31 0522) SpO2:  [95 %-98 %] 96 % (12/31 0522) Weight:  [115.2 kg] 115.2 kg (12/30 1600)  Intake/Output from previous day: 12/30 0701 - 12/31 0700 In: 1117.2 [I.V.:1017.2; IV Piggyback:100] Out: 500 [Urine:350] Intake/Output this shift: No intake/output data recorded.  Physical Exam:  General: Alert and oriented CV: RRR Lungs: Clear Abdomen: Soft, ND, Ostomy pink, stents in place Incisions: Wound again probed and examined in standing position.  Pt has significant clear drainage from penis but also some clear drainage from wound.  I still cannot identify a clear fascial dehiscence and question how beneficial it will be to open wound in the OR for further evaluation vs continued wound care but persistent drainage may impair wound healing.  Wound otherwise granulating very well and clean. Ext: NT, No erythema  Lab Results: Recent Labs    12/29/19 0417  HGB 8.7*  HCT 29.2*   CBC Latest Ref Rng & Units 12/29/2019 12/26/2019 12/25/2019  WBC 4.0 - 10.5 K/uL 5.0 5.5 6.3  Hemoglobin 13.0 - 17.0 g/dL 8.7(L) 9.5(L) 8.4(L)  Hematocrit 39.0 - 52.0 % 29.2(L) 31.4(L) 27.9(L)  Platelets 150 - 400 K/uL 270 287 245     BMET Recent Labs    12/29/19 0417  NA 142  K 3.6  CL 118*  CO2 16*  GLUCOSE 119*  BUN 14  CREATININE 1.16  CALCIUM 7.8*     Studies/Results: ECHO TEE  Result Date: 12/30/2019   TRANSESOPHOGEAL ECHO REPORT   Patient Name:   Jimmy [name] Date of Exam: 12/30/2019 Medical Rec #:  [id number]        Height: Accession #:    [id number]       Weight: Date of Birth:  09-18-1946         BSA: Patient  Age:    73 years         BP:           141/70 mmHg Patient Gender: M                HR:           88 bpm. Exam Location:  Inpatient  Procedure: 2D Echo Indications:     Bacteremia  History:         Patient has prior history of Echocardiogram examinations, most                  recent 12/28/2019.  Sonographer:     Clayton Lefort RDCS (AE) Referring Phys:  1993 [facility] G BARRETT Diagnosing Phys: Lyman Bishop MD  PROCEDURE: No evidence of vegetation. After discussion of the risks and benefits of a TEE, an informed consent was obtained from the patient. Patients was monitored while under deep sedation. The transesophogeal probe was passed through the esophogus of the patient. Imaged were obtained with the patient in a supine position. Image quality was excellent. The patient developed no complications during the procedure. IMPRESSIONS  1. Left ventricular ejection fraction, by visual estimation, is 60 to 65%. The left ventricle has normal function. There is no left ventricular hypertrophy.  2. The left ventricle has no regional wall motion  abnormalities.  3. Global right ventricle has normal systolic function.The right ventricular size is normal. No increase in right ventricular wall thickness.  4. Left atrial size was normal.  5. Right atrial size was normal.  6. The mitral valve is grossly normal. Trivial mitral valve regurgitation.  7. The tricuspid valve is grossly normal.  8. The aortic valve is tricuspid. Aortic valve regurgitation is not visualized.  9. The pulmonic valve was grossly normal. Pulmonic valve regurgitation is not visualized. 10. Small patent foramen ovale with predominantly right to left shunting across the atrial septum. 11. Evidence of atrial level shunting detected by color flow Doppler. 12. No evidence of any gross valvular vegetations. FINDINGS  Left Ventricle: Left ventricular ejection fraction, by visual estimation, is 60 to 65%. The left ventricle has normal function. The left ventricle has no  regional wall motion abnormalities. There is no left ventricular hypertrophy. Right Ventricle: The right ventricular size is normal. No increase in right ventricular wall thickness. Global RV systolic function is has normal systolic function. Left Atrium: Left atrial size was normal in size. Right Atrium: Right atrial size was normal in size Pericardium: There is no evidence of pericardial effusion. Mitral Valve: The mitral valve is grossly normal. Trivial mitral valve regurgitation. Tricuspid Valve: The tricuspid valve is grossly normal. Tricuspid valve regurgitation is trivial. Aortic Valve: The aortic valve is tricuspid. Aortic valve regurgitation is not visualized. Pulmonic Valve: The pulmonic valve was grossly normal. Pulmonic valve regurgitation is not visualized. Aorta: The aortic root and ascending aorta are structurally normal, with no evidence of dilitation. Shunts: Agitated saline contrast was given intravenously to evaluate for intracardiac shunting. Saline shunting was observed within 3-6 cardiac cycles suggestive of interatrial shunt. A small patent foramen ovale is detected with predominantly right to left shunting across the atrial septum. Evidence of atrial level shunting detected by color flow Doppler.  Lyman Bishop MD Electronically signed by Lyman Bishop MD Signature Date/Time: 12/30/2019/11:28:40 AM    Final     Assessment/Plan: Bacteremia/possible pyelonephritis s/p radical cystoprostatectomy and ileal conduit 12/10.  Readmitted on 12/23.   1. ID:ID following for MSSA bactermia and urine culture. TEE is ok. Pantoea growing in urine also that is being reincubated. On day7cefazolin IV. Also on oral vancomycin (day6) for c diff.Pt scheduled for TEE this morning due to difficult TTE study. Recommendations from ID are to continue IV cefazolin 2 g q 8 hrs and po vancomycin for 1 month.  Will need to see if patient can receive IV antibiotic every 8 hrs at home.  Will await case  management input on this. 2. Wound care:Wound continues to gradually heal but there remains a small amount of persistent serous drainage that is still worrisome for possible wound dehiscence.  Will obtain CT scan of abd/pelvis today to further assess and keep NPO after MN to make determination about need for wound exploration/fascial repair. 4. Ambulate/PT. Continue PT during hospitalization and upon discharge with home health PT. 5. DM: Continue SSI 6. DVT prophylaxis 7. Disp:Will assess wound situation and will need to have him set up for home health PT, home ostomy/wound care, and home health for antibiotic therapy per above recommendations.   LOS: 7 days   [name] 12/31/2019, 8:13 AM

## 2019-12-31 NOTE — Progress Notes (Signed)
Physical Therapy Treatment Patient Details Name: Jimmy [name] MRN: [id number] DOB: [birth year] Today's Date: 12/31/2019    History of Present Illness Pt is 73 yo male with PMH including prostate CA, invasive bladder CA s/p laparoscopic converted oped radical cystoprostatectomy with ileal conduit urinary diversion on 12/10/19.  Additionally with CAD, DM, obesity and see PMH for full H and P.  Pt presented with weakness, diarrhea, and drainage from abdominal incision. Admited with cdiff and AKI.    PT Comments    Pt in bed with spouse at bed side.  Assisted OOB.  General bed mobility comments: increased time; HOB elevated; all the way,  used bed rails; then required increased assist back to bed. General transfer comment: pt declined to amb due to pain hoever was able to static stand at bed x 3 min then take a few side steps to [facility].  Assisted back to bed per pt request.    Follow Up Recommendations  Home health PT;Supervision/Assistance - 24 hour     Equipment Recommendations  Rolling walker with 5" wheels(bariatric walker)    Recommendations for Other Services       Precautions / Restrictions Precautions Precautions: Fall Precaution Comments: urostomy Restrictions Weight Bearing Restrictions: No    Mobility  Bed Mobility Overal bed mobility: Needs Assistance Bed Mobility: Supine to Sit;Sit to Supine;Rolling Rolling: Min assist;Mod assist Sidelying to sit: Mod assist;+2 for safety/equipment;HOB elevated Supine to sit: HOB elevated;Mod assist;+2 for safety/equipment Sit to supine: Mod assist;Max assist   General bed mobility comments: increased time; HOB elevated; all the way,  used bed rails; then required increased assist back to bed.  Transfers Overall transfer level: Needs assistance Equipment used: Rolling walker (2 wheeled) Transfers: Sit to/from Stand Sit to Stand: Min guard;Supervision         General transfer comment: pt declined to amb due to pain hoever was  able to static stand at bed x 3 min then take a few side steps to [facility]  Ambulation/Gait                 Stairs             Wheelchair Mobility    Modified Rankin (Stroke Patients Only)       Balance                                            Cognition Arousal/Alertness: Awake/alert Behavior During Therapy: WFL for tasks assessed/performed Overall Cognitive Status: Within Functional Limits for tasks assessed                                        Exercises      General Comments        Pertinent Vitals/Pain Pain Assessment: 0-10 Pain Score: 7  Pain Location: r side ABD Pain Descriptors / Indicators: Sore;Grimacing;Guarding Pain Intervention(s): Monitored during session;Premedicated before session    Home Living                      Prior Function            PT Goals (current goals can now be found in the care plan section) Progress towards PT goals: Progressing toward goals    Frequency    Min 3X/week  PT Plan Current plan remains appropriate    Co-evaluation              AM-PAC PT "6 Clicks" Mobility   Outcome Measure  Help needed turning from your back to your side while in a flat bed without using bedrails?: A Little Help needed moving from lying on your back to sitting on the side of a flat bed without using bedrails?: A Little Help needed moving to and from a bed to a chair (including a wheelchair)?: A Little Help needed standing up from a chair using your arms (e.g., wheelchair or bedside chair)?: A Little Help needed to walk in hospital room?: A Little Help needed climbing 3-5 steps with a railing? : A Lot 6 Click Score: 17    End of Session   Activity Tolerance: Patient limited by fatigue;Patient limited by pain Patient left: in bed;with call bell/phone within reach;with bed alarm set;with nursing/sitter in room;with family/visitor present Nurse Communication: Mobility  status PT Visit Diagnosis: Difficulty in walking, not elsewhere classified (R26.2);Unsteadiness on feet (R26.81);Muscle weakness (generalized) (M62.81)     Time: 1525-1550 PT Time Calculation (min) (ACUTE ONLY): 25 min  Charges:  $Gait Training: 8-22 mins $Therapeutic Activity: 8-22 mins                     Rica Koyanagi  PTA Acute  Rehabilitation Services Pager      [phone] Office      [phone]

## 2019-12-31 NOTE — Progress Notes (Signed)
Dressing changed as ordered.  Instructed importance of using I/S, pt verbalizes rationale

## 2019-12-31 NOTE — Care Management Important Message (Signed)
Important Message  Patient Details IM Letter given to [place] Case Manager to present to the Patient Name: Jimmy [name] MRN: [id number] Date of Birth: [birth year]   Medicare Important Message Given:  Yes     [name] 12/31/2019, 1:07 PM

## 2020-01-01 DIAGNOSIS — A4901 Methicillin susceptible Staphylococcus aureus infection, unspecified site: Secondary | ICD-10-CM | POA: Diagnosis not present

## 2020-01-01 DIAGNOSIS — R7881 Bacteremia: Secondary | ICD-10-CM | POA: Diagnosis not present

## 2020-01-01 LAB — BASIC METABOLIC PANEL
Anion gap: 6 (ref 5–15)
BUN: 12 mg/dL (ref 8–23)
CO2: 20 mmol/L — ABNORMAL LOW (ref 22–32)
Calcium: 8.2 mg/dL — ABNORMAL LOW (ref 8.9–10.3)
Chloride: 118 mmol/L — ABNORMAL HIGH (ref 98–111)
Creatinine, Ser: 1.01 mg/dL (ref 0.61–1.24)
GFR calc Af Amer: >60 mL/min (ref >60)
GFR calc non Af Amer: >60 mL/min (ref >60)
Glucose, Bld: 110 mg/dL — ABNORMAL HIGH (ref 70–99)
Potassium: 3.6 mmol/L (ref 3.5–5.1)
Sodium: 144 mmol/L (ref 135–145)

## 2020-01-01 LAB — CBC
HCT: 31.4 % — ABNORMAL LOW (ref 39.0–52.0)
Hemoglobin: 9.4 g/dL — ABNORMAL LOW (ref 13.0–17.0)
MCH: 30.8 pg (ref 26.0–34.0)
MCHC: 29.9 g/dL — ABNORMAL LOW (ref 30.0–36.0)
MCV: 103 fL — ABNORMAL HIGH (ref 80.0–100.0)
Platelets: 246 10*3/uL (ref 150–400)
RBC: 3.05 MIL/uL — ABNORMAL LOW (ref 4.22–5.81)
RDW: 15.7 % — ABNORMAL HIGH (ref 11.5–15.5)
WBC: 3.8 10*3/uL — ABNORMAL LOW (ref 4.0–10.5)
nRBC: 0 % (ref 0.0–0.2)

## 2020-01-01 LAB — GLUCOSE, CAPILLARY
Glucose-Capillary: 107 mg/dL — ABNORMAL HIGH (ref 70–99)
Glucose-Capillary: 115 mg/dL — ABNORMAL HIGH (ref 70–99)
Glucose-Capillary: 142 mg/dL — ABNORMAL HIGH (ref 70–99)
Glucose-Capillary: 97 mg/dL (ref 70–99)

## 2020-01-01 MED ORDER — HEPARIN SOD (PORK) LOCK FLUSH 100 UNIT/ML IV SOLN
250.0000 [IU] | INTRAVENOUS | Status: AC | PRN
  Administered 2020-01-01: 250 [IU]
  Filled 2020-01-01: qty 2.5

## 2020-01-01 MED ORDER — TRAMADOL HCL 50 MG PO TABS: 50.0000 mg | ORAL_TABLET | Freq: Four times a day (QID) | ORAL | 0 refills | Status: DC | PRN

## 2020-01-01 MED ORDER — VANCOMYCIN 50 MG/ML ORAL SOLUTION: 125.0000 mg | Freq: Four times a day (QID) | ORAL | 0 refills | Status: AC

## 2020-01-01 NOTE — Progress Notes (Signed)
Patient ID: Jimmy [name], male   DOB: 07-22-1946, 74 y.o.   MRN: [id number]   Subjective: Pt continues to do well.  Ambulated yesterday and was weak but stable.  Tolerating diet.  Patient feels leakage has been decreased.  Objective: Vital signs in last 24 hours: Temp:  [97.6 F (36.4 C)-98 F (36.7 C)] 98 F (36.7 C) (01/01 0347) Pulse Rate:  [80-91] 80 (01/01 0347) Resp:  [18-20] 18 (01/01 0347) BP: (127-137)/(74-79) 127/74 (01/01 0347) SpO2:  [93 %-95 %] 95 % (01/01 0347)  Intake/Output from previous day: 12/31 0701 - 01/01 0700 In: 2051.8 [P.O.:360; I.V.:1691.8] Out: 900 [Urine:900] Intake/Output this shift: No intake/output data recorded.  Physical Exam:  General: Alert and oriented CV: RRR Lungs: Clear Abdomen: Soft, ND, ostomy pink (stents removed yesterday and new ostomy appliance in place), UOP remains good with clear urine Incisions: Wound continues to granulate.  Wound well packed superiorly by wife.  Minimal drainage noted at this time. Ext: NT, No erythema  Lab Results: Recent Labs    01/01/20 0424  HGB 9.4*  HCT 31.4*   CBC Latest Ref Rng & Units 01/01/2020 12/29/2019 12/26/2019  WBC 4.0 - 10.5 K/uL 3.8(L) 5.0 5.5  Hemoglobin 13.0 - 17.0 g/dL 9.4(L) 8.7(L) 9.5(L)  Hematocrit 39.0 - 52.0 % 31.4(L) 29.2(L) 31.4(L)  Platelets 150 - 400 K/uL 246 270 287     BMET Recent Labs    01/01/20 0424  NA 144  K 3.6  CL 118*  CO2 20*  GLUCOSE 110*  BUN 12  CREATININE 1.01  CALCIUM 8.2*     Studies/Results:   Assessment/Plan: Bacteremia/possible pyelonephritis s/p radical cystoprostatectomy and ileal conduit 12/10.  Readmitted on 12/23.  1. ID:ID following for MSSA bactermia and urine culture. Pantoea growing in urine also that is being reincubated. On day8cefazolin IV. Also on oral vancomycin (day7) for c diff. Recommendations from ID are to continue IV cefazolin 2 g q 8 hrs for 2 weeks total and po vancomycin for 1 month.  Home IV antibiotic  therapy is arranged 2. Wound care:CT yesterday without clear evidence of fascial dehiscence and no clear dehiscence on exam.  Still with some serous drainage and maybe ascitic peritoneal fluid.  Most drainage is from urethra though from pelvic drainage.  After discussing options with patient and wife, will plan to d/c home with continued wet to dry dressings with close outpatient follow up.  If wound is not healing over next week, will consider operative exploration.  They were also advised to call if he develops symptoms of infection, etc. 4. Ambulate/PT. Continue PT with home health PT. 5. DM: Continue SSI, resume oral meds at home. 6. DVT prophylaxis:  Continue [place] Lovenox as outpatient 7. Disp:D/C home today   LOS: 8 days   Jimmy [name] 01/01/2020, 7:10 AM

## 2020-01-01 NOTE — TOC Transition Note (Addendum)
Transition of Care [facility]) - CM/SW Discharge Note   Patient Details  Name: Jimmy [name] MRN: [id number] Date of Birth: 1946/04/11  [facility]) CM/SW Contact:  Lia Hopping, [place] Phone Number: 01/01/2020, 12:39 PM   Clinical Narrative:    Patient will transport by [facility] home.  CSW confirm with [employer] Rep. Carolynn Sayers the IV therapy is arranged for this evening.  Patient can safely discharge.  Spouse requested 3 in 1, Spouse complete paperwork, 3 in 1 provided.   [facility] arranged for 3:30pm pick up.    Final next level of care: [place] Barriers to Discharge: No Barriers Identified   Patient Goals and CMS Choice Patient states their goals for this hospitalization and ongoing recovery are:: To be home CMS Medicare.gov Compare Post Acute Care list provided to:: Other (Comment Required)(Spouse) Choice offered to / list presented to : Patient  Discharge Placement                Patient to be transferred to facility by: [place] Name of family member notified: Spouse    Discharge Plan and Services                DME Arranged: 3-N-1 DME Agency: [employer] Date DME Agency Contacted: 01/01/20     HH Arranged: PT, RN [place] Agency: [facility] Date HH Agency Contacted: 01/01/20 Time [place]: 1238 Representative spoke with at [place]: Amy/ Carolynn Sayers  Social Determinants of Health (SDOH) Interventions     Readmission Risk Interventions No flowsheet data found.

## 2020-01-01 NOTE — Progress Notes (Signed)
[name] was instructed on wound care to abdomen with teachback and demonstration, discharge, follow up, and medications instructions given, verbalized understanding, telemetry monitor removed, IV Tean RN to flush PICC prior to DC, PTAR to transport home

## 2020-01-01 NOTE — Progress Notes (Signed)
Spouse present in room. PICC line heparin locked for home. Spouse states will be assisting with medication administration. Discussed cleaning the hub and saline flushes; spouse able to verbalize discussed information and has picture chart showing correct use of PICC line flushes.

## 2020-01-01 NOTE — Discharge Instructions (Signed)
1) Wound care: Continue wet to dry saline dressing changes with Kerlix gauze at least twice per day and more often (3 or 4 times per day) if dressing becomes saturated.  Remember to pack wound at the top portion deeply.  Can use Q tip to help push gauze deep into wound. 2) Continue Lovenox injections once daily at home.  Please call if you are running out of supply. 3) Continue physical therapy and trying to ambulate more each day. 4) Use Glucerna supplement as needed to ensure good nutrition if not eating well.  Make sure to stay hydrated. 5) Leakage: We will need to tolerate some leakage from penis and wound right now.  However, if this seems to be increasing or continues to be excessive, please call and make Dr. Alinda Money aware and to get advice. 6) Call if fever > 101, vomiting, worsening weakness, increased pain, or other concerns. 7) Continue to change ostomy appliance every few days as needed. 8) Continue IV antibiotics until supply is complete.  Keep follow up with Infectious Disease (Dr. Johnnye Sima) for further assessment and for PICC line removal. 9) Continue oral vancomycin for at least 3 more weeks for treatment of diarrhea/infection.

## 2020-01-01 NOTE — Discharge Summary (Signed)
Date of admission: 12/23/2019  Date of discharge: 01/01/2020  Admission diagnosis: Bladder cancer, weakness, fever, diarrhea, dehydration, AKI Discharge diagnosis: Bladder cancer, MSSA bacteremia, pyelonephritis  Secondary diagnoses: Diabetes, hypertension, hyperlipidemia  History and Physical: For full details, please see admission history and physical. Briefly, Jimmy [name] is a 74 y.o. year old patient with locally advanced bladder cancer s/p neoadjuvant chemotherapy and subsequent radical cystoprostatectomy and ileal conduit urinary diversion.  He was discharged home in good condition on POD # 7 after surgery.  Hospital Course: He presented back to the hospital on POD # 12 with complaints of low grade fever, drainage from his incision site, diarrhea, and increasing weakness and malaise.  His wound was opened with serous fluid drained consistent most likely with seroma but no evidence of abscess or infection.  Subsequent blood cultures and urine cultures were positive for MSSA.  Infectious disease was consulted.  His port a cath was removed and PICC line placed. TEE was negative for bacterial endocarditis.  He was continued on cefazolin per ID and will complete a 2 weeks course of home IV antibiotics.  He also was found to be positive for C difficle and was started on oral vancomycin with plans to complete 4 weeks of treatment.  His diarrhea improved throughout his hospitalization.  He continued to have serous drainage from his wound.  Physical exam did not demonstrate any clear evidence of wound dehiscence but this persistent prompting a CT of the the abdomen and pelvis.  This did not indicate any clear dehiscence.  It was decided to continue to wound care and his wife was taught wound care and home health was ordered to assist with wound/ostomy care and home health PT will be restarted.  In addition, he had AKI upon admission with a Cr of 2.5.  This resolved with IVF hydration indicating  dehydration likely related to diarrhea.  This resolved and his renal function was normal upon discharge.  He was discharged home in stable condition on hospital day 8.  Laboratory values:  Recent Labs    01/01/20 0424  HGB 9.4*  HCT 31.4*   Recent Labs    01/01/20 0424  CREATININE 1.01    Disposition: Home  Discharge instruction: The patient was instructed to be ambulatory but told to refrain from heavy lifting, strenuous activity, or driving.  Discharge medications:  Allergies as of 01/01/2020      Reactions   Ace Inhibitors Cough   Cephalexin Hives, Diarrhea, Nausea Only   Tolerates Ancef, Rocephin, Cefoxitin   Crestor [rosuvastatin Calcium]    Lipitor [atorvastatin Calcium] Other (See Comments)   MUSCLE PAINS   Plavix [clopidogrel Bisulfate] Hives      Medication List    STOP taking these medications   ciprofloxacin 500 MG tablet Commonly known as: CIPRO   Magnesium 250 MG Tabs     TAKE these medications   ceFAZolin  IVPB Commonly known as: ANCEF Inject 2 g into the vein every 8 (eight) hours for 24 doses. Indication:  MSSA bacteremia Last Day of Therapy:  01/07/20 Labs - Once weekly:  CBC/D and CMP   enoxaparin 40 MG/0.4ML injection Commonly known as: LOVENOX Inject 0.4 mLs (40 mg total) into the skin daily.   losartan 100 MG tablet Commonly known as: COZAAR Take 1 tablet (100 mg total) by mouth daily.   metFORMIN 1000 MG tablet Commonly known as: GLUCOPHAGE TAKE 1 TABLET BY MOUTH TWICE A DAY WITH MEALS What changed:   how much to  take  how to take this  when to take this  additional instructions   ONE TOUCH ULTRA TEST test strip Generic drug: glucose blood USE TO TEST BLOOD SUGAR DAILY AS DIRECTED BY DOCTOR   onetouch ultrasoft lancets 1 each by Other route as needed. Use as instructed   Pitavastatin Calcium 4 MG Tabs Commonly known as: Livalo Take 1 tablet (4 mg total) by mouth daily.   traMADol 50 MG tablet Commonly known as:  ULTRAM Take 1-2 tablets (50-100 mg total) by mouth every 6 (six) hours as needed for moderate pain.   vancomycin 50 mg/mL  oral solution Commonly known as: VANCOCIN Take 2.5 mLs (125 mg total) by mouth every 6 (six) hours for 21 days.   Victoza 18 MG/3ML Sopn Generic drug: liraglutide INJECT 1.8 MG UNDER THE SKIN ONCE DAILY What changed: See the new instructions.            Home Infusion Instuctions  (From admission, onward)         Start     Ordered   12/31/19 0000  Home infusion instructions    Question:  Instructions  Answer:  Flushing of vascular access device: 0.9% NaCl pre/post medication administration and prn patency; Heparin 100 u/ml, 23ml for implanted ports and Heparin 10u/ml, 68ml for all other central venous catheters.   12/31/19 1313          Followup:  Follow-up Information    Raynelle Bring, MD Follow up.   Specialty: Urology Why: Office will call to schedule follow up visit next week. Contact information: [place] [address] [phone]

## 2020-01-04 ENCOUNTER — Other Ambulatory Visit: Payer: Self-pay | Admitting: Infectious Diseases

## 2020-01-04 DIAGNOSIS — A0472 Enterocolitis due to Clostridium difficile, not specified as recurrent: Secondary | ICD-10-CM

## 2020-01-04 MED ORDER — FIDAXOMICIN 200 MG PO TABS: 200.0000 mg | ORAL_TABLET | Freq: Two times a day (BID) | ORAL | 0 refills | Status: DC

## 2020-01-04 NOTE — Progress Notes (Signed)
Changed his po vanco to fidaxomicin, 10 day course.  Pt called

## 2020-01-07 ENCOUNTER — Telehealth: Payer: Self-pay

## 2020-01-07 DIAGNOSIS — E1122 Type 2 diabetes mellitus with diabetic chronic kidney disease: Secondary | ICD-10-CM | POA: Diagnosis not present

## 2020-01-07 DIAGNOSIS — T8131XD Disruption of external operation (surgical) wound, not elsewhere classified, subsequent encounter: Secondary | ICD-10-CM | POA: Diagnosis not present

## 2020-01-07 DIAGNOSIS — N183 Chronic kidney disease, stage 3 unspecified: Secondary | ICD-10-CM | POA: Diagnosis not present

## 2020-01-07 DIAGNOSIS — Z436 Encounter for attention to other artificial openings of urinary tract: Secondary | ICD-10-CM | POA: Diagnosis not present

## 2020-01-07 DIAGNOSIS — I1310 Hypertensive heart and chronic kidney disease without heart failure, with stage 1 through stage 4 chronic kidney disease, or unspecified chronic kidney disease: Secondary | ICD-10-CM | POA: Diagnosis not present

## 2020-01-07 DIAGNOSIS — M6281 Muscle weakness (generalized): Secondary | ICD-10-CM | POA: Diagnosis not present

## 2020-01-07 DIAGNOSIS — C678 Malignant neoplasm of overlapping sites of bladder: Secondary | ICD-10-CM | POA: Diagnosis not present

## 2020-01-07 DIAGNOSIS — R262 Difficulty in walking, not elsewhere classified: Secondary | ICD-10-CM | POA: Diagnosis not present

## 2020-01-07 DIAGNOSIS — A0472 Enterocolitis due to Clostridium difficile, not specified as recurrent: Secondary | ICD-10-CM | POA: Diagnosis not present

## 2020-01-07 NOTE — Telephone Encounter (Signed)
Pt. Wife called ans sent a message stating there was some errors in her husband mychart, his vascepa is not even in his chart and that may them confused at the hospital, also he wanted to know if there was any substitute or generic for Livalo its going to be really high with there new insurance, They have both switched to [facility], also his metformin says twice a day in the system and should be only once daily, his Vit D should be changed to 5,000 Units not 2,000, and Both of there maintenance  medications   Need to be filled as #90 now due to there new insurance.

## 2020-01-07 NOTE — Telephone Encounter (Signed)
Have him come in for a med check appt

## 2020-01-08 ENCOUNTER — Other Ambulatory Visit: Payer: Self-pay

## 2020-01-08 DIAGNOSIS — E118 Type 2 diabetes mellitus with unspecified complications: Secondary | ICD-10-CM

## 2020-01-11 DIAGNOSIS — N183 Chronic kidney disease, stage 3 unspecified: Secondary | ICD-10-CM | POA: Diagnosis not present

## 2020-01-11 DIAGNOSIS — M6281 Muscle weakness (generalized): Secondary | ICD-10-CM | POA: Diagnosis not present

## 2020-01-11 DIAGNOSIS — I1310 Hypertensive heart and chronic kidney disease without heart failure, with stage 1 through stage 4 chronic kidney disease, or unspecified chronic kidney disease: Secondary | ICD-10-CM | POA: Diagnosis not present

## 2020-01-11 DIAGNOSIS — T8131XD Disruption of external operation (surgical) wound, not elsewhere classified, subsequent encounter: Secondary | ICD-10-CM | POA: Diagnosis not present

## 2020-01-11 DIAGNOSIS — C678 Malignant neoplasm of overlapping sites of bladder: Secondary | ICD-10-CM | POA: Diagnosis not present

## 2020-01-11 DIAGNOSIS — Z436 Encounter for attention to other artificial openings of urinary tract: Secondary | ICD-10-CM | POA: Diagnosis not present

## 2020-01-11 DIAGNOSIS — R262 Difficulty in walking, not elsewhere classified: Secondary | ICD-10-CM | POA: Diagnosis not present

## 2020-01-11 DIAGNOSIS — A0472 Enterocolitis due to Clostridium difficile, not specified as recurrent: Secondary | ICD-10-CM | POA: Diagnosis not present

## 2020-01-11 DIAGNOSIS — E1122 Type 2 diabetes mellitus with diabetic chronic kidney disease: Secondary | ICD-10-CM | POA: Diagnosis not present

## 2020-01-11 NOTE — Telephone Encounter (Signed)
Pt has an appt 02-23-20 and he has enough med to last until than . Pt has been in and out of the hospital and now is doing PT. Thanks [employer]

## 2020-01-11 NOTE — Telephone Encounter (Signed)
Pt wife would like to know if there is a sub for the livalo. Please advise [facility]

## 2020-01-11 NOTE — Telephone Encounter (Signed)
There are other choices but I think he had difficulty with them.  It would best be handled by a virtual visit or an office visit

## 2020-01-12 ENCOUNTER — Telehealth: Payer: Self-pay | Admitting: Family Medicine

## 2020-01-12 DIAGNOSIS — M6281 Muscle weakness (generalized): Secondary | ICD-10-CM | POA: Diagnosis not present

## 2020-01-12 DIAGNOSIS — A0472 Enterocolitis due to Clostridium difficile, not specified as recurrent: Secondary | ICD-10-CM | POA: Diagnosis not present

## 2020-01-12 DIAGNOSIS — E1122 Type 2 diabetes mellitus with diabetic chronic kidney disease: Secondary | ICD-10-CM | POA: Diagnosis not present

## 2020-01-12 DIAGNOSIS — C678 Malignant neoplasm of overlapping sites of bladder: Secondary | ICD-10-CM | POA: Diagnosis not present

## 2020-01-12 DIAGNOSIS — Z436 Encounter for attention to other artificial openings of urinary tract: Secondary | ICD-10-CM | POA: Diagnosis not present

## 2020-01-12 DIAGNOSIS — N183 Chronic kidney disease, stage 3 unspecified: Secondary | ICD-10-CM | POA: Diagnosis not present

## 2020-01-12 DIAGNOSIS — R262 Difficulty in walking, not elsewhere classified: Secondary | ICD-10-CM | POA: Diagnosis not present

## 2020-01-12 DIAGNOSIS — I1310 Hypertensive heart and chronic kidney disease without heart failure, with stage 1 through stage 4 chronic kidney disease, or unspecified chronic kidney disease: Secondary | ICD-10-CM | POA: Diagnosis not present

## 2020-01-12 DIAGNOSIS — T8131XD Disruption of external operation (surgical) wound, not elsewhere classified, subsequent encounter: Secondary | ICD-10-CM | POA: Diagnosis not present

## 2020-01-13 DIAGNOSIS — M6281 Muscle weakness (generalized): Secondary | ICD-10-CM | POA: Diagnosis not present

## 2020-01-13 DIAGNOSIS — N183 Chronic kidney disease, stage 3 unspecified: Secondary | ICD-10-CM | POA: Diagnosis not present

## 2020-01-13 DIAGNOSIS — A0472 Enterocolitis due to Clostridium difficile, not specified as recurrent: Secondary | ICD-10-CM | POA: Diagnosis not present

## 2020-01-13 DIAGNOSIS — I1310 Hypertensive heart and chronic kidney disease without heart failure, with stage 1 through stage 4 chronic kidney disease, or unspecified chronic kidney disease: Secondary | ICD-10-CM | POA: Diagnosis not present

## 2020-01-13 DIAGNOSIS — C678 Malignant neoplasm of overlapping sites of bladder: Secondary | ICD-10-CM | POA: Diagnosis not present

## 2020-01-13 DIAGNOSIS — Z436 Encounter for attention to other artificial openings of urinary tract: Secondary | ICD-10-CM | POA: Diagnosis not present

## 2020-01-13 DIAGNOSIS — R262 Difficulty in walking, not elsewhere classified: Secondary | ICD-10-CM | POA: Diagnosis not present

## 2020-01-13 DIAGNOSIS — E1122 Type 2 diabetes mellitus with diabetic chronic kidney disease: Secondary | ICD-10-CM | POA: Diagnosis not present

## 2020-01-13 DIAGNOSIS — T8131XD Disruption of external operation (surgical) wound, not elsewhere classified, subsequent encounter: Secondary | ICD-10-CM | POA: Diagnosis not present

## 2020-01-14 DIAGNOSIS — Z936 Other artificial openings of urinary tract status: Secondary | ICD-10-CM | POA: Diagnosis not present

## 2020-01-14 DIAGNOSIS — Z8551 Personal history of malignant neoplasm of bladder: Secondary | ICD-10-CM | POA: Diagnosis not present

## 2020-01-15 DIAGNOSIS — M6281 Muscle weakness (generalized): Secondary | ICD-10-CM | POA: Diagnosis not present

## 2020-01-15 DIAGNOSIS — A0472 Enterocolitis due to Clostridium difficile, not specified as recurrent: Secondary | ICD-10-CM | POA: Diagnosis not present

## 2020-01-15 DIAGNOSIS — Z436 Encounter for attention to other artificial openings of urinary tract: Secondary | ICD-10-CM | POA: Diagnosis not present

## 2020-01-15 DIAGNOSIS — T8131XD Disruption of external operation (surgical) wound, not elsewhere classified, subsequent encounter: Secondary | ICD-10-CM | POA: Diagnosis not present

## 2020-01-15 DIAGNOSIS — E1122 Type 2 diabetes mellitus with diabetic chronic kidney disease: Secondary | ICD-10-CM | POA: Diagnosis not present

## 2020-01-15 DIAGNOSIS — I1310 Hypertensive heart and chronic kidney disease without heart failure, with stage 1 through stage 4 chronic kidney disease, or unspecified chronic kidney disease: Secondary | ICD-10-CM | POA: Diagnosis not present

## 2020-01-15 DIAGNOSIS — N183 Chronic kidney disease, stage 3 unspecified: Secondary | ICD-10-CM | POA: Diagnosis not present

## 2020-01-15 DIAGNOSIS — C678 Malignant neoplasm of overlapping sites of bladder: Secondary | ICD-10-CM | POA: Diagnosis not present

## 2020-01-15 DIAGNOSIS — R262 Difficulty in walking, not elsewhere classified: Secondary | ICD-10-CM | POA: Diagnosis not present

## 2020-01-18 DIAGNOSIS — M6281 Muscle weakness (generalized): Secondary | ICD-10-CM | POA: Diagnosis not present

## 2020-01-18 DIAGNOSIS — R262 Difficulty in walking, not elsewhere classified: Secondary | ICD-10-CM | POA: Diagnosis not present

## 2020-01-18 DIAGNOSIS — N183 Chronic kidney disease, stage 3 unspecified: Secondary | ICD-10-CM | POA: Diagnosis not present

## 2020-01-18 DIAGNOSIS — E1122 Type 2 diabetes mellitus with diabetic chronic kidney disease: Secondary | ICD-10-CM | POA: Diagnosis not present

## 2020-01-18 DIAGNOSIS — T8131XD Disruption of external operation (surgical) wound, not elsewhere classified, subsequent encounter: Secondary | ICD-10-CM | POA: Diagnosis not present

## 2020-01-18 DIAGNOSIS — I1310 Hypertensive heart and chronic kidney disease without heart failure, with stage 1 through stage 4 chronic kidney disease, or unspecified chronic kidney disease: Secondary | ICD-10-CM | POA: Diagnosis not present

## 2020-01-18 DIAGNOSIS — A0472 Enterocolitis due to Clostridium difficile, not specified as recurrent: Secondary | ICD-10-CM | POA: Diagnosis not present

## 2020-01-18 DIAGNOSIS — Z436 Encounter for attention to other artificial openings of urinary tract: Secondary | ICD-10-CM | POA: Diagnosis not present

## 2020-01-18 DIAGNOSIS — C678 Malignant neoplasm of overlapping sites of bladder: Secondary | ICD-10-CM | POA: Diagnosis not present

## 2020-01-19 DIAGNOSIS — M6281 Muscle weakness (generalized): Secondary | ICD-10-CM | POA: Diagnosis not present

## 2020-01-19 DIAGNOSIS — E1122 Type 2 diabetes mellitus with diabetic chronic kidney disease: Secondary | ICD-10-CM | POA: Diagnosis not present

## 2020-01-19 DIAGNOSIS — I1310 Hypertensive heart and chronic kidney disease without heart failure, with stage 1 through stage 4 chronic kidney disease, or unspecified chronic kidney disease: Secondary | ICD-10-CM | POA: Diagnosis not present

## 2020-01-19 DIAGNOSIS — T8131XD Disruption of external operation (surgical) wound, not elsewhere classified, subsequent encounter: Secondary | ICD-10-CM | POA: Diagnosis not present

## 2020-01-19 DIAGNOSIS — C678 Malignant neoplasm of overlapping sites of bladder: Secondary | ICD-10-CM | POA: Diagnosis not present

## 2020-01-19 DIAGNOSIS — Z436 Encounter for attention to other artificial openings of urinary tract: Secondary | ICD-10-CM | POA: Diagnosis not present

## 2020-01-19 DIAGNOSIS — A0472 Enterocolitis due to Clostridium difficile, not specified as recurrent: Secondary | ICD-10-CM | POA: Diagnosis not present

## 2020-01-19 DIAGNOSIS — R262 Difficulty in walking, not elsewhere classified: Secondary | ICD-10-CM | POA: Diagnosis not present

## 2020-01-19 DIAGNOSIS — N183 Chronic kidney disease, stage 3 unspecified: Secondary | ICD-10-CM | POA: Diagnosis not present

## 2020-01-20 DIAGNOSIS — C678 Malignant neoplasm of overlapping sites of bladder: Secondary | ICD-10-CM | POA: Diagnosis not present

## 2020-01-20 DIAGNOSIS — N183 Chronic kidney disease, stage 3 unspecified: Secondary | ICD-10-CM | POA: Diagnosis not present

## 2020-01-20 DIAGNOSIS — M6281 Muscle weakness (generalized): Secondary | ICD-10-CM | POA: Diagnosis not present

## 2020-01-20 DIAGNOSIS — T8131XD Disruption of external operation (surgical) wound, not elsewhere classified, subsequent encounter: Secondary | ICD-10-CM | POA: Diagnosis not present

## 2020-01-20 DIAGNOSIS — I1310 Hypertensive heart and chronic kidney disease without heart failure, with stage 1 through stage 4 chronic kidney disease, or unspecified chronic kidney disease: Secondary | ICD-10-CM | POA: Diagnosis not present

## 2020-01-20 DIAGNOSIS — Z436 Encounter for attention to other artificial openings of urinary tract: Secondary | ICD-10-CM | POA: Diagnosis not present

## 2020-01-20 DIAGNOSIS — E1122 Type 2 diabetes mellitus with diabetic chronic kidney disease: Secondary | ICD-10-CM | POA: Diagnosis not present

## 2020-01-20 DIAGNOSIS — A0472 Enterocolitis due to Clostridium difficile, not specified as recurrent: Secondary | ICD-10-CM | POA: Diagnosis not present

## 2020-01-20 DIAGNOSIS — R262 Difficulty in walking, not elsewhere classified: Secondary | ICD-10-CM | POA: Diagnosis not present

## 2020-01-21 DIAGNOSIS — E1122 Type 2 diabetes mellitus with diabetic chronic kidney disease: Secondary | ICD-10-CM | POA: Diagnosis not present

## 2020-01-21 DIAGNOSIS — T8131XD Disruption of external operation (surgical) wound, not elsewhere classified, subsequent encounter: Secondary | ICD-10-CM | POA: Diagnosis not present

## 2020-01-21 DIAGNOSIS — M6281 Muscle weakness (generalized): Secondary | ICD-10-CM | POA: Diagnosis not present

## 2020-01-21 DIAGNOSIS — N183 Chronic kidney disease, stage 3 unspecified: Secondary | ICD-10-CM | POA: Diagnosis not present

## 2020-01-21 DIAGNOSIS — R262 Difficulty in walking, not elsewhere classified: Secondary | ICD-10-CM | POA: Diagnosis not present

## 2020-01-21 DIAGNOSIS — A0472 Enterocolitis due to Clostridium difficile, not specified as recurrent: Secondary | ICD-10-CM | POA: Diagnosis not present

## 2020-01-21 DIAGNOSIS — I1310 Hypertensive heart and chronic kidney disease without heart failure, with stage 1 through stage 4 chronic kidney disease, or unspecified chronic kidney disease: Secondary | ICD-10-CM | POA: Diagnosis not present

## 2020-01-21 DIAGNOSIS — Z436 Encounter for attention to other artificial openings of urinary tract: Secondary | ICD-10-CM | POA: Diagnosis not present

## 2020-01-21 DIAGNOSIS — C678 Malignant neoplasm of overlapping sites of bladder: Secondary | ICD-10-CM | POA: Diagnosis not present

## 2020-01-22 DIAGNOSIS — T8131XD Disruption of external operation (surgical) wound, not elsewhere classified, subsequent encounter: Secondary | ICD-10-CM | POA: Diagnosis not present

## 2020-01-22 DIAGNOSIS — E1122 Type 2 diabetes mellitus with diabetic chronic kidney disease: Secondary | ICD-10-CM | POA: Diagnosis not present

## 2020-01-22 DIAGNOSIS — M6281 Muscle weakness (generalized): Secondary | ICD-10-CM | POA: Diagnosis not present

## 2020-01-22 DIAGNOSIS — I1310 Hypertensive heart and chronic kidney disease without heart failure, with stage 1 through stage 4 chronic kidney disease, or unspecified chronic kidney disease: Secondary | ICD-10-CM | POA: Diagnosis not present

## 2020-01-22 DIAGNOSIS — C678 Malignant neoplasm of overlapping sites of bladder: Secondary | ICD-10-CM | POA: Diagnosis not present

## 2020-01-22 DIAGNOSIS — N183 Chronic kidney disease, stage 3 unspecified: Secondary | ICD-10-CM | POA: Diagnosis not present

## 2020-01-22 DIAGNOSIS — R262 Difficulty in walking, not elsewhere classified: Secondary | ICD-10-CM | POA: Diagnosis not present

## 2020-01-22 DIAGNOSIS — A0472 Enterocolitis due to Clostridium difficile, not specified as recurrent: Secondary | ICD-10-CM | POA: Diagnosis not present

## 2020-01-22 DIAGNOSIS — Z436 Encounter for attention to other artificial openings of urinary tract: Secondary | ICD-10-CM | POA: Diagnosis not present

## 2020-01-25 DIAGNOSIS — I1310 Hypertensive heart and chronic kidney disease without heart failure, with stage 1 through stage 4 chronic kidney disease, or unspecified chronic kidney disease: Secondary | ICD-10-CM | POA: Diagnosis not present

## 2020-01-25 DIAGNOSIS — C678 Malignant neoplasm of overlapping sites of bladder: Secondary | ICD-10-CM | POA: Diagnosis not present

## 2020-01-25 DIAGNOSIS — A0472 Enterocolitis due to Clostridium difficile, not specified as recurrent: Secondary | ICD-10-CM | POA: Diagnosis not present

## 2020-01-25 DIAGNOSIS — Z436 Encounter for attention to other artificial openings of urinary tract: Secondary | ICD-10-CM | POA: Diagnosis not present

## 2020-01-25 DIAGNOSIS — E1122 Type 2 diabetes mellitus with diabetic chronic kidney disease: Secondary | ICD-10-CM | POA: Diagnosis not present

## 2020-01-25 DIAGNOSIS — R262 Difficulty in walking, not elsewhere classified: Secondary | ICD-10-CM | POA: Diagnosis not present

## 2020-01-25 DIAGNOSIS — T8131XD Disruption of external operation (surgical) wound, not elsewhere classified, subsequent encounter: Secondary | ICD-10-CM | POA: Diagnosis not present

## 2020-01-25 DIAGNOSIS — N183 Chronic kidney disease, stage 3 unspecified: Secondary | ICD-10-CM | POA: Diagnosis not present

## 2020-01-25 DIAGNOSIS — M6281 Muscle weakness (generalized): Secondary | ICD-10-CM | POA: Diagnosis not present

## 2020-01-26 ENCOUNTER — Encounter: Payer: Self-pay | Admitting: Infectious Diseases

## 2020-01-26 ENCOUNTER — Other Ambulatory Visit: Payer: Self-pay

## 2020-01-26 ENCOUNTER — Ambulatory Visit: Payer: Medicare PPO | Admitting: Infectious Diseases

## 2020-01-26 DIAGNOSIS — B9561 Methicillin susceptible Staphylococcus aureus infection as the cause of diseases classified elsewhere: Secondary | ICD-10-CM | POA: Diagnosis not present

## 2020-01-26 DIAGNOSIS — C679 Malignant neoplasm of bladder, unspecified: Secondary | ICD-10-CM | POA: Diagnosis not present

## 2020-01-26 DIAGNOSIS — Z95828 Presence of other vascular implants and grafts: Secondary | ICD-10-CM

## 2020-01-26 DIAGNOSIS — C678 Malignant neoplasm of overlapping sites of bladder: Secondary | ICD-10-CM | POA: Diagnosis not present

## 2020-01-26 DIAGNOSIS — R7881 Bacteremia: Secondary | ICD-10-CM | POA: Diagnosis not present

## 2020-01-26 DIAGNOSIS — C688 Malignant neoplasm of overlapping sites of urinary organs: Secondary | ICD-10-CM | POA: Diagnosis not present

## 2020-01-26 DIAGNOSIS — R8271 Bacteriuria: Secondary | ICD-10-CM | POA: Diagnosis not present

## 2020-01-26 LAB — MISC LABCORP TEST (SEND OUT): Labcorp test code: 182261

## 2020-01-26 NOTE — Assessment & Plan Note (Signed)
Removed

## 2020-01-26 NOTE — Assessment & Plan Note (Signed)
Appreciate f/u of Dr Alinda Money and Dr Osker Mason.

## 2020-01-26 NOTE — Progress Notes (Signed)
   Subjective:    Patient ID: Jimmy [name], male    DOB: 1946-05-16, 74 y.o.   MRN: [id number]  HPI 74 y.o. male with  Bladder cancer, MSSAB with porta-cath which has been removed. He also has CDI. His TEE is negative for endocarditis. He also had wound dehiscence issues (Cx grew MSSA and E coli). His UCx grew Pantoea and MSSA.  He was d/c home to complete 2 weeks of ancef.  He was treated with po vanco for his c diff but developed GI upset. He was then changed to po fidoxamicin for 10 days on 01-04-20.   Has been having n/v over last 24h. Always col but no fever.  Prior to last 24h he was walking with walker with minimal assist.  Last Chemo was in the fall/september. Has Onc f/u 01-27-20.   Has not gotten new Port. His previous picc was removed.   No appetite- taking ensure and wife is making "high protein" foods.   Review of Systems  Constitutional: Positive for appetite change. Negative for chills, fever and unexpected weight change.  Gastrointestinal: Positive for nausea and vomiting. Negative for constipation and diarrhea.  Genitourinary: Negative for difficulty urinating.  Please see HPI. All other systems reviewed and negative.      Objective:   Physical Exam Vitals reviewed.  Constitutional:      General: He is not in acute distress.    Appearance: Normal appearance. He is not toxic-appearing.  HENT:     Mouth/Throat:     Mouth: Mucous membranes are dry.     Pharynx: Posterior oropharyngeal erythema present.  Eyes:     Extraocular Movements: Extraocular movements intact.     Pupils: Pupils are equal, round, and reactive to light.  Cardiovascular:     Rate and Rhythm: Normal rate and regular rhythm.  Pulmonary:     Effort: Pulmonary effort is normal.     Breath sounds: Normal breath sounds.  Chest:    Abdominal:     General: Bowel sounds are normal.     Palpations: Abdomen is soft.    Musculoskeletal:     Cervical back: Normal range of motion and neck supple.      Right lower leg: Edema present.     Left lower leg: Edema present.  Neurological:     Mental Status: He is alert.           Assessment & Plan:

## 2020-01-26 NOTE — Assessment & Plan Note (Signed)
Will repeat his BCx today  no clear si/sx that he has persistence or recurrence.  Will call if repeat are positive.  He will f/u with his pcp

## 2020-01-27 ENCOUNTER — Other Ambulatory Visit: Payer: Self-pay

## 2020-01-27 ENCOUNTER — Other Ambulatory Visit: Payer: Medicare Other

## 2020-01-27 ENCOUNTER — Inpatient Hospital Stay: Payer: Medicare PPO | Attending: Oncology

## 2020-01-27 ENCOUNTER — Inpatient Hospital Stay (HOSPITAL_BASED_OUTPATIENT_CLINIC_OR_DEPARTMENT_OTHER): Payer: Medicare PPO | Admitting: Oncology

## 2020-01-27 VITALS — BP 109/70 | HR 104 | Temp 97.8°F | Resp 20 | Wt 208.4 lb

## 2020-01-27 DIAGNOSIS — Z9221 Personal history of antineoplastic chemotherapy: Secondary | ICD-10-CM | POA: Diagnosis not present

## 2020-01-27 DIAGNOSIS — Z79899 Other long term (current) drug therapy: Secondary | ICD-10-CM | POA: Diagnosis not present

## 2020-01-27 DIAGNOSIS — C679 Malignant neoplasm of bladder, unspecified: Secondary | ICD-10-CM

## 2020-01-27 LAB — CMP (CANCER CENTER ONLY)
ALT: 37 U/L (ref 0–44)
AST: 67 U/L — ABNORMAL HIGH (ref 15–41)
Albumin: 3.4 g/dL — ABNORMAL LOW (ref 3.5–5.0)
Alkaline Phosphatase: 97 U/L (ref 38–126)
Anion gap: 12 (ref 5–15)
BUN: 17 mg/dL (ref 8–23)
CO2: 24 mmol/L (ref 22–32)
Calcium: 9.3 mg/dL (ref 8.9–10.3)
Chloride: 106 mmol/L (ref 98–111)
Creatinine: 1.39 mg/dL — ABNORMAL HIGH (ref 0.61–1.24)
GFR, Est AFR Am: 58 mL/min — ABNORMAL LOW (ref >60)
GFR, Estimated: 50 mL/min — ABNORMAL LOW (ref >60)
Glucose, Bld: 109 mg/dL — ABNORMAL HIGH (ref 70–99)
Potassium: 4.1 mmol/L (ref 3.5–5.1)
Sodium: 142 mmol/L (ref 135–145)
Total Bilirubin: 0.4 mg/dL (ref 0.3–1.2)
Total Protein: 7.5 g/dL (ref 6.5–8.1)

## 2020-01-27 LAB — CBC WITH DIFFERENTIAL (CANCER CENTER ONLY)
Abs Immature Granulocytes: 0.01 10*3/uL (ref 0.00–0.07)
Basophils Absolute: 0 10*3/uL (ref 0.0–0.1)
Basophils Relative: 1 %
Eosinophils Absolute: 0 10*3/uL (ref 0.0–0.5)
Eosinophils Relative: 1 %
HCT: 37.5 % — ABNORMAL LOW (ref 39.0–52.0)
Hemoglobin: 11.9 g/dL — ABNORMAL LOW (ref 13.0–17.0)
Immature Granulocytes: 0 %
Lymphocytes Relative: 18 %
Lymphs Abs: 0.8 10*3/uL (ref 0.7–4.0)
MCH: 29.6 pg (ref 26.0–34.0)
MCHC: 31.7 g/dL (ref 30.0–36.0)
MCV: 93.3 fL (ref 80.0–100.0)
Monocytes Absolute: 0.8 10*3/uL (ref 0.1–1.0)
Monocytes Relative: 17 %
Neutro Abs: 2.8 10*3/uL (ref 1.7–7.7)
Neutrophils Relative %: 63 %
Platelet Count: 193 10*3/uL (ref 150–400)
RBC: 4.02 MIL/uL — ABNORMAL LOW (ref 4.22–5.81)
RDW: 15.5 % (ref 11.5–15.5)
WBC Count: 4.4 10*3/uL (ref 4.0–10.5)
nRBC: 0 % (ref 0.0–0.2)

## 2020-01-27 NOTE — Progress Notes (Signed)
Hematology and Oncology Follow Up Visit  Jimmy [name] [id number] 06-19-1946 74 y.o. 01/27/2020 9:17 AM Jimmy [name], MDLalonde, Jimmy Jarvis, MD   Principle Diagnosis: 74 year old man with T2N0 high-grade urothelial carcinoma of the bladder diagnosed in June 2020.     Prior Therapy: TURBT with right ureteral stent placement as well as left ureteral stent placement at that time.  The final pathology showed infiltrating high-grade papillary adenocarcinoma invading into the detrusor muscle with lymphovascular invasion identified.  This was completed on 06/22/2019.    Neoadjuvant chemotherapy started on August 07, 2019 with cisplatin and gemcitabine started on August 07, 2019.  He completed 4 cycles of chemotherapy on October 16, 2019.  He is status post radical cystectomy and lymph node dissection on December 10, 2019.  The final pathology showed 1.5 cm residual tumor with invasion into the perivesicular soft tissue.  Final pathological stage was T3a and 0 with 0 out of 10 lymph nodes involved.  Current therapy: Active surveillance.  Interim History: [name] presents today for a return evaluation.  Since the last visit, he underwent radical cystectomy and ileal conduit diversion on December 10.  He did have few complications postoperatively and developed bacteremia that required intravenous antibiotics.  His Port-A-Cath removed and did require a temporary PICC line at that time.  His TEE was negative for bacterial endocarditis.  He is recovering slowly at this time and has regained some of his appetite back.  He did have an episode of nausea and vomiting but has.  He is keeping his food down the last 24 hours.  He is ambulating short distances and has reported no pain or discomfort.                Medications: Updated on review. Current Outpatient Medications  Medication Sig Dispense Refill  . enoxaparin (LOVENOX) 40 MG/0.4ML injection Inject 0.4 mLs (40 mg total) into the skin  daily. 12 mL 0  . fidaxomicin (DIFICID) 200 MG TABS tablet Take 1 tablet (200 mg total) by mouth 2 (two) times daily. (Patient not taking: Reported on 01/26/2020) 20 tablet 0  . icosapent Ethyl (VASCEPA) 1 g capsule Take 2 g by mouth 2 (two) times daily.    . Lancets (ONETOUCH ULTRASOFT) lancets 1 each by Other route as needed. Use as instructed 100 each 11  . losartan (COZAAR) 100 MG tablet Take 1 tablet (100 mg total) by mouth daily. 90 tablet 3  . metFORMIN (GLUCOPHAGE) 1000 MG tablet TAKE 1 TABLET BY MOUTH TWICE A DAY WITH MEALS (Patient taking differently: Take 1,000 mg by mouth daily with breakfast. ) 180 tablet 1  . Nutritional Supplements (VITAMIN D BOOSTER PO) Take 5,000 Units by mouth.    . ONE TOUCH ULTRA TEST test strip USE TO TEST BLOOD SUGAR DAILY AS DIRECTED BY DOCTOR 100 each 3  . Pitavastatin Calcium (LIVALO) 4 MG TABS Take 1 tablet (4 mg total) by mouth daily. 90 tablet 3  . traMADol (ULTRAM) 50 MG tablet Take 1-2 tablets (50-100 mg total) by mouth every 6 (six) hours as needed for moderate pain. 25 tablet 0  . traMADol (ULTRAM) 50 MG tablet Take 1-2 tablets (50-100 mg total) by mouth every 6 (six) hours as needed (pain). (Patient not taking: Reported on 01/26/2020) 15 tablet 0  . VICTOZA 18 MG/3ML SOPN INJECT 1.8 MG UNDER THE SKIN ONCE DAILY (Patient taking differently: Inject 1.8 mg into the skin daily. ) 27 pen 1   No current facility-administered medications for  this visit.     Allergies:  Allergies  Allergen Reactions  . Ace Inhibitors Cough  . Cephalexin Hives, Diarrhea and Nausea Only    Tolerates Ancef, Rocephin, Cefoxitin  . Crestor [Rosuvastatin Calcium]   . Lipitor [Atorvastatin Calcium] Other (See Comments)    MUSCLE PAINS   . Plavix [Clopidogrel Bisulfate] Hives  . Vancomycin Nausea And Vomiting    Po route caused n/v.        Physical Exam:  Blood pressure 109/70, pulse (!) 104, temperature 97.8 F (36.6 C), temperature source Temporal, resp. rate  20, weight 208 lb 6.4 oz (94.5 kg), SpO2 95 %.     ECOG: 1    General appearance: Alert, awake without any distress. Head: Atraumatic without abnormalities Oropharynx: Without any thrush or ulcers. Eyes: No scleral icterus. Lymph nodes: No lymphadenopathy noted in the cervical, supraclavicular, or axillary nodes Heart:regular rate and rhythm, without any murmurs or gallops.   [name]: Clear to auscultation without any rhonchi, wheezes or dullness to percussion. Abdomin: Soft, nontender without any shifting dullness or ascites. Musculoskeletal: No clubbing or cyanosis. Neurological: No motor or sensory deficits. Skin: No rashes or lesions. Psychiatric: Mood and affect appeared normal.        Lab Results: Lab Results  Component Value Date   WBC 3.8 (L) 01/01/2020   HGB 9.4 (L) 01/01/2020   HCT 31.4 (L) 01/01/2020   MCV 103.0 (H) 01/01/2020   PLT 246 01/01/2020     Chemistry      Component Value Date/Time   NA 144 01/01/2020 0424   NA 141 06/03/2019 1157   K 3.6 01/01/2020 0424   CL 118 (H) 01/01/2020 0424   CO2 20 (L) 01/01/2020 0424   BUN 12 01/01/2020 0424   BUN 19 06/03/2019 1157   CREATININE 1.01 01/01/2020 0424   CREATININE 1.45 (H) 11/06/2019 1255   CREATININE 0.89 11/19/2016 0920      Component Value Date/Time   CALCIUM 8.2 (L) 01/01/2020 0424   ALKPHOS 105 12/23/2019 1436   AST 23 12/23/2019 1436   AST 51 (H) 11/06/2019 1255   ALT 16 12/23/2019 1436   ALT 31 11/06/2019 1255   BILITOT 0.7 12/23/2019 1436   BILITOT 0.5 11/06/2019 1255     IMPRESSION: Status post cystoprostatectomy. Bilateral ureteral stents in the proximal ureters and exiting via the right mid abdominal urostomy. Mild bilateral hydronephrosis.  Postsurgical changes related to open midline anterior abdominal wound. No underlying abscess or fistula on CT.  Moderate abdominopelvic ascites.  Cirrhosis.  Additional ancillary findings as above.    Impression and Plan:    74 year old man with:  1.  High-grade urothelial carcinoma of the bladder diagnosed in June 2020.  He initially was found to have T2N0 disease and subsequently had a residual T3a disease after cystectomy.        The natural course of this disease was discussed at this time.  Risk of relapse was also reviewed including reviewing his CT scan on December 31, 2019.  He has completed neoadjuvant chemotherapy followed by radical surgery and no evidence of metastatic disease at this time.  I recommended continued active surveillance with repeat imaging studies including abdomen and pelvis as well as the chest in 6 months.  He is scheduled to have that done under the care of Dr. Alinda Money and I will see him shortly after that.  The role for additional systemic therapy was reviewed and perform immunotherapy will only be reserved if he developed recurrent disease in  the future.  He is agreeable at this time.   2. IV access:Port-A-Cath was removed because of bacteremia.  3. Antiemetics: Compazine is available to him with the occasional nausea at times.   4. Renal function surveillance: His creatinine returned to baseline with normal function as of January 01, 2020.  5. Follow-up:  In 6 months for repeat evaluation.  30  minutes was spent on this encounter.  The time was dedicated to reviewing imaging studies, reviewing laboratory data, disease status update treatment options and future plan of care discussion.      Zola Button, MD 1/27/20219:17 AM

## 2020-01-28 ENCOUNTER — Telehealth: Payer: Self-pay | Admitting: Oncology

## 2020-01-28 NOTE — Telephone Encounter (Signed)
Scheduled appt per 1/27 los.  Sent a message to HIM pool to have a calendar mailed out. 

## 2020-01-30 LAB — URINE CULTURE: Culture: >=100000 — AB

## 2020-02-01 LAB — CULTURE, BLOOD (SINGLE)
MICRO NUMBER:: 10082608
MICRO NUMBER:: 10086487
Result:: NO GROWTH
SPECIMEN QUALITY:: ADEQUATE

## 2020-02-04 DIAGNOSIS — C678 Malignant neoplasm of overlapping sites of bladder: Secondary | ICD-10-CM | POA: Diagnosis not present

## 2020-02-04 DIAGNOSIS — N183 Chronic kidney disease, stage 3 unspecified: Secondary | ICD-10-CM | POA: Diagnosis not present

## 2020-02-04 DIAGNOSIS — R262 Difficulty in walking, not elsewhere classified: Secondary | ICD-10-CM | POA: Diagnosis not present

## 2020-02-04 DIAGNOSIS — T8131XD Disruption of external operation (surgical) wound, not elsewhere classified, subsequent encounter: Secondary | ICD-10-CM | POA: Diagnosis not present

## 2020-02-04 DIAGNOSIS — I1310 Hypertensive heart and chronic kidney disease without heart failure, with stage 1 through stage 4 chronic kidney disease, or unspecified chronic kidney disease: Secondary | ICD-10-CM | POA: Diagnosis not present

## 2020-02-04 DIAGNOSIS — E1122 Type 2 diabetes mellitus with diabetic chronic kidney disease: Secondary | ICD-10-CM | POA: Diagnosis not present

## 2020-02-04 DIAGNOSIS — Z436 Encounter for attention to other artificial openings of urinary tract: Secondary | ICD-10-CM | POA: Diagnosis not present

## 2020-02-04 DIAGNOSIS — A0472 Enterocolitis due to Clostridium difficile, not specified as recurrent: Secondary | ICD-10-CM | POA: Diagnosis not present

## 2020-02-04 DIAGNOSIS — M6281 Muscle weakness (generalized): Secondary | ICD-10-CM | POA: Diagnosis not present

## 2020-02-05 DIAGNOSIS — C678 Malignant neoplasm of overlapping sites of bladder: Secondary | ICD-10-CM | POA: Diagnosis not present

## 2020-02-05 DIAGNOSIS — R262 Difficulty in walking, not elsewhere classified: Secondary | ICD-10-CM | POA: Diagnosis not present

## 2020-02-05 DIAGNOSIS — A0472 Enterocolitis due to Clostridium difficile, not specified as recurrent: Secondary | ICD-10-CM | POA: Diagnosis not present

## 2020-02-05 DIAGNOSIS — M6281 Muscle weakness (generalized): Secondary | ICD-10-CM | POA: Diagnosis not present

## 2020-02-05 DIAGNOSIS — E1122 Type 2 diabetes mellitus with diabetic chronic kidney disease: Secondary | ICD-10-CM | POA: Diagnosis not present

## 2020-02-05 DIAGNOSIS — T8131XD Disruption of external operation (surgical) wound, not elsewhere classified, subsequent encounter: Secondary | ICD-10-CM | POA: Diagnosis not present

## 2020-02-05 DIAGNOSIS — Z436 Encounter for attention to other artificial openings of urinary tract: Secondary | ICD-10-CM | POA: Diagnosis not present

## 2020-02-05 DIAGNOSIS — N183 Chronic kidney disease, stage 3 unspecified: Secondary | ICD-10-CM | POA: Diagnosis not present

## 2020-02-05 DIAGNOSIS — I1310 Hypertensive heart and chronic kidney disease without heart failure, with stage 1 through stage 4 chronic kidney disease, or unspecified chronic kidney disease: Secondary | ICD-10-CM | POA: Diagnosis not present

## 2020-02-06 DIAGNOSIS — M6281 Muscle weakness (generalized): Secondary | ICD-10-CM | POA: Diagnosis not present

## 2020-02-06 DIAGNOSIS — N183 Chronic kidney disease, stage 3 unspecified: Secondary | ICD-10-CM | POA: Diagnosis not present

## 2020-02-06 DIAGNOSIS — Z436 Encounter for attention to other artificial openings of urinary tract: Secondary | ICD-10-CM | POA: Diagnosis not present

## 2020-02-06 DIAGNOSIS — E1122 Type 2 diabetes mellitus with diabetic chronic kidney disease: Secondary | ICD-10-CM | POA: Diagnosis not present

## 2020-02-06 DIAGNOSIS — C678 Malignant neoplasm of overlapping sites of bladder: Secondary | ICD-10-CM | POA: Diagnosis not present

## 2020-02-06 DIAGNOSIS — T8131XD Disruption of external operation (surgical) wound, not elsewhere classified, subsequent encounter: Secondary | ICD-10-CM | POA: Diagnosis not present

## 2020-02-06 DIAGNOSIS — I1310 Hypertensive heart and chronic kidney disease without heart failure, with stage 1 through stage 4 chronic kidney disease, or unspecified chronic kidney disease: Secondary | ICD-10-CM | POA: Diagnosis not present

## 2020-02-06 DIAGNOSIS — R262 Difficulty in walking, not elsewhere classified: Secondary | ICD-10-CM | POA: Diagnosis not present

## 2020-02-06 DIAGNOSIS — A0472 Enterocolitis due to Clostridium difficile, not specified as recurrent: Secondary | ICD-10-CM | POA: Diagnosis not present

## 2020-02-08 ENCOUNTER — Emergency Department (HOSPITAL_COMMUNITY): Payer: Medicare PPO

## 2020-02-08 ENCOUNTER — Telehealth: Payer: Self-pay

## 2020-02-08 ENCOUNTER — Other Ambulatory Visit: Payer: Self-pay

## 2020-02-08 ENCOUNTER — Emergency Department (HOSPITAL_COMMUNITY)
Admit: 2020-02-08 | Discharge: 2020-02-08 | Disposition: A | Discharge status: Home / Self Care | Payer: Medicare PPO | Attending: Emergency Medicine | Admitting: Emergency Medicine

## 2020-02-08 ENCOUNTER — Inpatient Hospital Stay (HOSPITAL_COMMUNITY)
Admission: EM | Admit: 2020-02-08 | Discharge: 2020-02-21 | DRG: 177 | Disposition: A | Discharge status: Skilled Nursing Facility | Payer: Medicare PPO | Attending: Internal Medicine | Admitting: Internal Medicine

## 2020-02-08 ENCOUNTER — Encounter (HOSPITAL_COMMUNITY): Payer: Self-pay | Admitting: *Deleted

## 2020-02-08 ENCOUNTER — Ambulatory Visit: Payer: Medicare Other | Admitting: Family Medicine

## 2020-02-08 DIAGNOSIS — Z87891 Personal history of nicotine dependence: Secondary | ICD-10-CM

## 2020-02-08 DIAGNOSIS — I82401 Acute embolism and thrombosis of unspecified deep veins of right lower extremity: Secondary | ICD-10-CM | POA: Diagnosis not present

## 2020-02-08 DIAGNOSIS — I82432 Acute embolism and thrombosis of left popliteal vein: Secondary | ICD-10-CM | POA: Diagnosis not present

## 2020-02-08 DIAGNOSIS — R000 Tachycardia, unspecified: Secondary | ICD-10-CM | POA: Diagnosis not present

## 2020-02-08 DIAGNOSIS — R262 Difficulty in walking, not elsewhere classified: Secondary | ICD-10-CM | POA: Diagnosis not present

## 2020-02-08 DIAGNOSIS — J9601 Acute respiratory failure with hypoxia: Secondary | ICD-10-CM | POA: Diagnosis present

## 2020-02-08 DIAGNOSIS — R410 Disorientation, unspecified: Secondary | ICD-10-CM | POA: Diagnosis not present

## 2020-02-08 DIAGNOSIS — E118 Type 2 diabetes mellitus with unspecified complications: Secondary | ICD-10-CM | POA: Diagnosis not present

## 2020-02-08 DIAGNOSIS — I469 Cardiac arrest, cause unspecified: Secondary | ICD-10-CM | POA: Diagnosis not present

## 2020-02-08 DIAGNOSIS — M6389 Disorders of muscle in diseases classified elsewhere, multiple sites: Secondary | ICD-10-CM | POA: Diagnosis not present

## 2020-02-08 DIAGNOSIS — C679 Malignant neoplasm of bladder, unspecified: Secondary | ICD-10-CM | POA: Diagnosis not present

## 2020-02-08 DIAGNOSIS — H2513 Age-related nuclear cataract, bilateral: Secondary | ICD-10-CM | POA: Diagnosis present

## 2020-02-08 DIAGNOSIS — N1831 Chronic kidney disease, stage 3a: Secondary | ICD-10-CM | POA: Diagnosis not present

## 2020-02-08 DIAGNOSIS — R404 Transient alteration of awareness: Secondary | ICD-10-CM | POA: Diagnosis not present

## 2020-02-08 DIAGNOSIS — R270 Ataxia, unspecified: Secondary | ICD-10-CM | POA: Diagnosis not present

## 2020-02-08 DIAGNOSIS — E876 Hypokalemia: Secondary | ICD-10-CM | POA: Diagnosis not present

## 2020-02-08 DIAGNOSIS — J1282 Pneumonia due to coronavirus disease 2019: Secondary | ICD-10-CM | POA: Diagnosis present

## 2020-02-08 DIAGNOSIS — R4182 Altered mental status, unspecified: Secondary | ICD-10-CM | POA: Diagnosis not present

## 2020-02-08 DIAGNOSIS — Z8619 Personal history of other infectious and parasitic diseases: Secondary | ICD-10-CM

## 2020-02-08 DIAGNOSIS — E669 Obesity, unspecified: Secondary | ICD-10-CM | POA: Diagnosis present

## 2020-02-08 DIAGNOSIS — M818 Other osteoporosis without current pathological fracture: Secondary | ICD-10-CM | POA: Diagnosis present

## 2020-02-08 DIAGNOSIS — J9600 Acute respiratory failure, unspecified whether with hypoxia or hypercapnia: Secondary | ICD-10-CM | POA: Diagnosis present

## 2020-02-08 DIAGNOSIS — Z6829 Body mass index (BMI) 29.0-29.9, adult: Secondary | ICD-10-CM

## 2020-02-08 DIAGNOSIS — K760 Fatty (change of) liver, not elsewhere classified: Secondary | ICD-10-CM | POA: Diagnosis present

## 2020-02-08 DIAGNOSIS — R490 Dysphonia: Secondary | ICD-10-CM | POA: Diagnosis not present

## 2020-02-08 DIAGNOSIS — E1122 Type 2 diabetes mellitus with diabetic chronic kidney disease: Secondary | ICD-10-CM | POA: Diagnosis present

## 2020-02-08 DIAGNOSIS — E785 Hyperlipidemia, unspecified: Secondary | ICD-10-CM | POA: Diagnosis present

## 2020-02-08 DIAGNOSIS — E1159 Type 2 diabetes mellitus with other circulatory complications: Secondary | ICD-10-CM | POA: Diagnosis not present

## 2020-02-08 DIAGNOSIS — E46 Unspecified protein-calorie malnutrition: Secondary | ICD-10-CM | POA: Diagnosis not present

## 2020-02-08 DIAGNOSIS — J189 Pneumonia, unspecified organism: Secondary | ICD-10-CM | POA: Diagnosis not present

## 2020-02-08 DIAGNOSIS — Z79899 Other long term (current) drug therapy: Secondary | ICD-10-CM

## 2020-02-08 DIAGNOSIS — Z923 Personal history of irradiation: Secondary | ICD-10-CM

## 2020-02-08 DIAGNOSIS — K558 Other vascular disorders of intestine: Secondary | ICD-10-CM | POA: Diagnosis not present

## 2020-02-08 DIAGNOSIS — K922 Gastrointestinal hemorrhage, unspecified: Secondary | ICD-10-CM | POA: Diagnosis not present

## 2020-02-08 DIAGNOSIS — R41841 Cognitive communication deficit: Secondary | ICD-10-CM | POA: Diagnosis not present

## 2020-02-08 DIAGNOSIS — Z9221 Personal history of antineoplastic chemotherapy: Secondary | ICD-10-CM

## 2020-02-08 DIAGNOSIS — R0689 Other abnormalities of breathing: Secondary | ICD-10-CM | POA: Diagnosis not present

## 2020-02-08 DIAGNOSIS — R8281 Pyuria: Secondary | ICD-10-CM | POA: Diagnosis not present

## 2020-02-08 DIAGNOSIS — E875 Hyperkalemia: Secondary | ICD-10-CM | POA: Diagnosis not present

## 2020-02-08 DIAGNOSIS — Z881 Allergy status to other antibiotic agents status: Secondary | ICD-10-CM

## 2020-02-08 DIAGNOSIS — R52 Pain, unspecified: Secondary | ICD-10-CM | POA: Diagnosis not present

## 2020-02-08 DIAGNOSIS — Z7984 Long term (current) use of oral hypoglycemic drugs: Secondary | ICD-10-CM

## 2020-02-08 DIAGNOSIS — E1169 Type 2 diabetes mellitus with other specified complication: Secondary | ICD-10-CM | POA: Diagnosis present

## 2020-02-08 DIAGNOSIS — H25013 Cortical age-related cataract, bilateral: Secondary | ICD-10-CM | POA: Diagnosis present

## 2020-02-08 DIAGNOSIS — E870 Hyperosmolality and hypernatremia: Secondary | ICD-10-CM | POA: Diagnosis not present

## 2020-02-08 DIAGNOSIS — I2510 Atherosclerotic heart disease of native coronary artery without angina pectoris: Secondary | ICD-10-CM | POA: Diagnosis not present

## 2020-02-08 DIAGNOSIS — E1136 Type 2 diabetes mellitus with diabetic cataract: Secondary | ICD-10-CM | POA: Diagnosis present

## 2020-02-08 DIAGNOSIS — G9341 Metabolic encephalopathy: Secondary | ICD-10-CM | POA: Diagnosis not present

## 2020-02-08 DIAGNOSIS — Z79891 Long term (current) use of opiate analgesic: Secondary | ICD-10-CM

## 2020-02-08 DIAGNOSIS — I10 Essential (primary) hypertension: Secondary | ICD-10-CM | POA: Diagnosis not present

## 2020-02-08 DIAGNOSIS — E872 Acidosis: Secondary | ICD-10-CM | POA: Diagnosis not present

## 2020-02-08 DIAGNOSIS — I129 Hypertensive chronic kidney disease with stage 1 through stage 4 chronic kidney disease, or unspecified chronic kidney disease: Secondary | ICD-10-CM | POA: Diagnosis present

## 2020-02-08 DIAGNOSIS — Z936 Other artificial openings of urinary tract status: Secondary | ICD-10-CM

## 2020-02-08 DIAGNOSIS — Z825 Family history of asthma and other chronic lower respiratory diseases: Secondary | ICD-10-CM

## 2020-02-08 DIAGNOSIS — R279 Unspecified lack of coordination: Secondary | ICD-10-CM | POA: Diagnosis not present

## 2020-02-08 DIAGNOSIS — R0602 Shortness of breath: Secondary | ICD-10-CM | POA: Diagnosis not present

## 2020-02-08 DIAGNOSIS — R0600 Dyspnea, unspecified: Secondary | ICD-10-CM

## 2020-02-08 DIAGNOSIS — R400 Somnolence: Secondary | ICD-10-CM

## 2020-02-08 DIAGNOSIS — M6281 Muscle weakness (generalized): Secondary | ICD-10-CM | POA: Diagnosis not present

## 2020-02-08 DIAGNOSIS — U071 COVID-19: Principal | ICD-10-CM | POA: Diagnosis present

## 2020-02-08 DIAGNOSIS — Z743 Need for continuous supervision: Secondary | ICD-10-CM | POA: Diagnosis not present

## 2020-02-08 DIAGNOSIS — Z8546 Personal history of malignant neoplasm of prostate: Secondary | ICD-10-CM

## 2020-02-08 DIAGNOSIS — G4733 Obstructive sleep apnea (adult) (pediatric): Secondary | ICD-10-CM | POA: Diagnosis present

## 2020-02-08 DIAGNOSIS — D649 Anemia, unspecified: Secondary | ICD-10-CM | POA: Diagnosis not present

## 2020-02-08 DIAGNOSIS — R1312 Dysphagia, oropharyngeal phase: Secondary | ICD-10-CM | POA: Diagnosis not present

## 2020-02-08 DIAGNOSIS — I152 Hypertension secondary to endocrine disorders: Secondary | ICD-10-CM | POA: Diagnosis present

## 2020-02-08 DIAGNOSIS — R0902 Hypoxemia: Secondary | ICD-10-CM

## 2020-02-08 DIAGNOSIS — Z888 Allergy status to other drugs, medicaments and biological substances status: Secondary | ICD-10-CM

## 2020-02-08 DIAGNOSIS — Z87442 Personal history of urinary calculi: Secondary | ICD-10-CM

## 2020-02-08 LAB — URINALYSIS, ROUTINE W REFLEX MICROSCOPIC
Bilirubin Urine: NEGATIVE
Glucose, UA: NEGATIVE mg/dL
Hgb urine dipstick: NEGATIVE
Ketones, ur: NEGATIVE mg/dL
Nitrite: NEGATIVE
Protein, ur: NEGATIVE mg/dL
Specific Gravity, Urine: 1.012 (ref 1.005–1.030)
pH: 6 (ref 5.0–8.0)

## 2020-02-08 LAB — HEPATIC FUNCTION PANEL
ALT: 22 U/L (ref 0–44)
AST: 41 U/L (ref 15–41)
Albumin: 3.2 g/dL — ABNORMAL LOW (ref 3.5–5.0)
Alkaline Phosphatase: 134 U/L — ABNORMAL HIGH (ref 38–126)
Bilirubin, Direct: 0.2 mg/dL (ref 0.0–0.2)
Indirect Bilirubin: 0.5 mg/dL (ref 0.3–0.9)
Total Bilirubin: 0.7 mg/dL (ref 0.3–1.2)
Total Protein: 7.6 g/dL (ref 6.5–8.1)

## 2020-02-08 LAB — POC SARS CORONAVIRUS 2 AG: SARS Coronavirus 2 Ag: NEGATIVE

## 2020-02-08 LAB — CBC
HCT: 40.4 % (ref 39.0–52.0)
Hemoglobin: 12.7 g/dL — ABNORMAL LOW (ref 13.0–17.0)
MCH: 28.8 pg (ref 26.0–34.0)
MCHC: 31.4 g/dL (ref 30.0–36.0)
MCV: 91.6 fL (ref 80.0–100.0)
Platelets: 250 10*3/uL (ref 150–400)
RBC: 4.41 MIL/uL (ref 4.22–5.81)
RDW: 15.9 % — ABNORMAL HIGH (ref 11.5–15.5)
WBC: 5.3 10*3/uL (ref 4.0–10.5)
nRBC: 0 % (ref 0.0–0.2)

## 2020-02-08 LAB — CBG MONITORING, ED
Glucose-Capillary: 110 mg/dL — ABNORMAL HIGH (ref 70–99)
Glucose-Capillary: 105 mg/dL — ABNORMAL HIGH (ref 70–99)

## 2020-02-08 LAB — FERRITIN: Ferritin: 47 ng/mL (ref 24–336)

## 2020-02-08 LAB — BASIC METABOLIC PANEL
Anion gap: 10 (ref 5–15)
BUN: 47 mg/dL — ABNORMAL HIGH (ref 8–23)
CO2: 18 mmol/L — ABNORMAL LOW (ref 22–32)
Calcium: 9.4 mg/dL (ref 8.9–10.3)
Chloride: 109 mmol/L (ref 98–111)
Creatinine, Ser: 1.35 mg/dL — ABNORMAL HIGH (ref 0.61–1.24)
GFR calc Af Amer: 60 mL/min — ABNORMAL LOW (ref >60)
GFR calc non Af Amer: 52 mL/min — ABNORMAL LOW (ref >60)
Glucose, Bld: 122 mg/dL — ABNORMAL HIGH (ref 70–99)
Potassium: 4.7 mmol/L (ref 3.5–5.1)
Sodium: 137 mmol/L (ref 135–145)

## 2020-02-08 LAB — PROTIME-INR
INR: 1.1 (ref 0.8–1.2)
Prothrombin Time: 13.8 seconds (ref 11.4–15.2)

## 2020-02-08 LAB — RESPIRATORY PANEL BY RT PCR (FLU A&B, COVID)
Influenza A by PCR: NEGATIVE
Influenza B by PCR: NEGATIVE
SARS Coronavirus 2 by RT PCR: POSITIVE — AB

## 2020-02-08 LAB — C-REACTIVE PROTEIN: CRP: 4.1 mg/dL — ABNORMAL HIGH (ref <1.0)

## 2020-02-08 LAB — AMMONIA: Ammonia: 54 umol/L — ABNORMAL HIGH (ref 9–35)

## 2020-02-08 LAB — TROPONIN I (HIGH SENSITIVITY)
Troponin I (High Sensitivity): 6 ng/L (ref <18)
Troponin I (High Sensitivity): 6 ng/L (ref <18)

## 2020-02-08 LAB — TYPE AND SCREEN
ABO/RH(D): O POS
Antibody Screen: NEGATIVE

## 2020-02-08 LAB — BRAIN NATRIURETIC PEPTIDE: B Natriuretic Peptide: 30.2 pg/mL (ref 0.0–100.0)

## 2020-02-08 LAB — APTT: aPTT: 32 seconds (ref 24–36)

## 2020-02-08 MED ORDER — HEPARIN BOLUS VIA INFUSION
3000.0000 [IU] | Freq: Once | INTRAVENOUS | Status: DC
  Filled 2020-02-08: qty 3000

## 2020-02-08 MED ORDER — INSULIN ASPART 100 UNIT/ML ~~LOC~~ SOLN
0.0000 [IU] | Freq: Three times a day (TID) | SUBCUTANEOUS | Status: DC
  Administered 2020-02-09: 2 [IU] via SUBCUTANEOUS
  Administered 2020-02-09: 17:00:00 1 [IU] via SUBCUTANEOUS
  Administered 2020-02-09 – 2020-02-11 (×6): 2 [IU] via SUBCUTANEOUS
  Administered 2020-02-11: 18:00:00 3 [IU] via SUBCUTANEOUS
  Administered 2020-02-12 (×2): 2 [IU] via SUBCUTANEOUS
  Administered 2020-02-12: 17:00:00 5 [IU] via SUBCUTANEOUS
  Administered 2020-02-13 (×3): 3 [IU] via SUBCUTANEOUS
  Administered 2020-02-14: 11:00:00 5 [IU] via SUBCUTANEOUS
  Administered 2020-02-14: 07:00:00 3 [IU] via SUBCUTANEOUS
  Administered 2020-02-14 – 2020-02-15 (×2): 2 [IU] via SUBCUTANEOUS
  Administered 2020-02-15 – 2020-02-16 (×3): 3 [IU] via SUBCUTANEOUS
  Administered 2020-02-16: 09:00:00 5 [IU] via SUBCUTANEOUS
  Administered 2020-02-16: 12:00:00 9 [IU] via SUBCUTANEOUS
  Administered 2020-02-17: 09:00:00 3 [IU] via SUBCUTANEOUS
  Administered 2020-02-17: 17:00:00 5 [IU] via SUBCUTANEOUS
  Administered 2020-02-17: 12:00:00 3 [IU] via SUBCUTANEOUS
  Administered 2020-02-18 (×3): 5 [IU] via SUBCUTANEOUS
  Administered 2020-02-19 (×2): 2 [IU] via SUBCUTANEOUS
  Filled 2020-02-08: qty 0.09

## 2020-02-08 MED ORDER — SODIUM CHLORIDE 0.9% FLUSH: 3.0000 mL | INTRAVENOUS | Status: DC | PRN

## 2020-02-08 MED ORDER — ONDANSETRON HCL 4 MG PO TABS: 4.0000 mg | ORAL_TABLET | Freq: Four times a day (QID) | ORAL | Status: DC | PRN

## 2020-02-08 MED ORDER — GUAIFENESIN-DM 100-10 MG/5ML PO SYRP: 10.0000 mL | ORAL_SOLUTION | ORAL | Status: DC | PRN

## 2020-02-08 MED ORDER — ONDANSETRON HCL 4 MG/2ML IJ SOLN
4.0000 mg | Freq: Four times a day (QID) | INTRAMUSCULAR | Status: DC | PRN
  Filled 2020-02-08: qty 2

## 2020-02-08 MED ORDER — SODIUM CHLORIDE 0.9% FLUSH
3.0000 mL | Freq: Two times a day (BID) | INTRAVENOUS | Status: DC
  Administered 2020-02-08 – 2020-02-20 (×23): 3 mL via INTRAVENOUS

## 2020-02-08 MED ORDER — HEPARIN (PORCINE) 25000 UT/250ML-% IV SOLN: 1400.0000 [IU]/h | INTRAVENOUS | Status: DC

## 2020-02-08 MED ORDER — SODIUM CHLORIDE 0.9 % IV SOLN: 250.0000 mL | INTRAVENOUS | Status: DC | PRN

## 2020-02-08 MED ORDER — IOHEXOL 350 MG/ML SOLN
100.0000 mL | Freq: Once | INTRAVENOUS | Status: AC | PRN
  Administered 2020-02-08: 19:00:00 80 mL via INTRAVENOUS

## 2020-02-08 MED ORDER — INSULIN ASPART 100 UNIT/ML ~~LOC~~ SOLN
0.0000 [IU] | Freq: Every day | SUBCUTANEOUS | Status: DC
  Administered 2020-02-12: 21:00:00 2 [IU] via SUBCUTANEOUS
  Administered 2020-02-13: 21:00:00 3 [IU] via SUBCUTANEOUS
  Administered 2020-02-14 – 2020-02-16 (×3): 4 [IU] via SUBCUTANEOUS
  Administered 2020-02-17: 22:00:00 2 [IU] via SUBCUTANEOUS
  Administered 2020-02-18: 21:00:00 3 [IU] via SUBCUTANEOUS
  Filled 2020-02-08: qty 0.05

## 2020-02-08 MED ORDER — SODIUM CHLORIDE 0.9% FLUSH: 3.0000 mL | Freq: Once | INTRAVENOUS | Status: DC

## 2020-02-08 MED ORDER — DEXAMETHASONE SODIUM PHOSPHATE 10 MG/ML IJ SOLN
6.0000 mg | INTRAMUSCULAR | Status: AC
  Administered 2020-02-08 – 2020-02-17 (×10): 6 mg via INTRAVENOUS
  Filled 2020-02-08 (×10): qty 1

## 2020-02-08 MED ORDER — SODIUM CHLORIDE (PF) 0.9 % IJ SOLN
INTRAMUSCULAR | Status: AC
  Administered 2020-02-08: 21:00:00 10 mL
  Filled 2020-02-08: qty 50

## 2020-02-08 MED ORDER — ZINC SULFATE 220 (50 ZN) MG PO CAPS
220.0000 mg | ORAL_CAPSULE | Freq: Every day | ORAL | Status: DC
  Administered 2020-02-08 – 2020-02-21 (×14): 220 mg via ORAL
  Filled 2020-02-08 (×14): qty 1

## 2020-02-08 MED ORDER — ASCORBIC ACID 500 MG PO TABS
500.0000 mg | ORAL_TABLET | Freq: Every day | ORAL | Status: DC
  Administered 2020-02-08 – 2020-02-21 (×14): 500 mg via ORAL
  Filled 2020-02-08 (×14): qty 1

## 2020-02-08 MED ORDER — SODIUM CHLORIDE 0.9 % IV SOLN
200.0000 mg | Freq: Once | INTRAVENOUS | Status: AC
  Administered 2020-02-08: 22:00:00 200 mg via INTRAVENOUS
  Filled 2020-02-08: qty 200

## 2020-02-08 MED ORDER — HYDROCOD POLST-CPM POLST ER 10-8 MG/5ML PO SUER
5.0000 mL | Freq: Two times a day (BID) | ORAL | Status: DC | PRN
  Administered 2020-02-09: 5 mL via ORAL
  Filled 2020-02-08: qty 5

## 2020-02-08 MED ORDER — ACETAMINOPHEN 325 MG PO TABS
650.0000 mg | ORAL_TABLET | Freq: Four times a day (QID) | ORAL | Status: DC | PRN
  Administered 2020-02-20: 19:00:00 650 mg via ORAL
  Filled 2020-02-08 (×2): qty 2

## 2020-02-08 MED ORDER — HEPARIN (PORCINE) 25000 UT/250ML-% IV SOLN
1400.0000 [IU]/h | INTRAVENOUS | Status: DC
  Administered 2020-02-08 – 2020-02-09 (×3): 1400 [IU]/h via INTRAVENOUS
  Filled 2020-02-08 (×2): qty 250

## 2020-02-08 MED ORDER — SODIUM CHLORIDE 0.9 % IV SOLN
100.0000 mg | Freq: Every day | INTRAVENOUS | Status: AC
  Administered 2020-02-09 – 2020-02-12 (×4): 100 mg via INTRAVENOUS
  Filled 2020-02-08 (×4): qty 20

## 2020-02-08 NOTE — H&P (Signed)
History and Physical    Jimmy [name] [id number] DOB: 25-May-1946 DOA: 02/08/2020  PCP: [name], MD  Patient coming from: Home  Chief Complaint: Shortness of breath  HPI: Jimmy [name] is a 74 y.o. male with medical history significant of chronic kidney disease, hypertension, bladder cancer, coronary artery disease, comes in from home after home health nurse noticed has been more confused the last couple days with increasing oxygen requirements.  Patient reports left leg pain also.  He has a history of urethral cancer status post chemo and resection of tumor with a chronic healing wound that appears well found to be very hypoxic in the emergency department initially on nonrebreather has been weaned down to 6 L nasal cannula.  Patient reports he has been sick for several days at home with shortness of breath and cough.  Patient found to have a left lower extremity DVT.  CTA shows negative PE but does show opacities bilaterally.  Patient is Covid positive.  Patient be referred for admission for acute hypoxic respiratory failure secondary to bilateral Covid pneumonia.  Currently mentating normally.  Review of Systems: As per HPI otherwise 10 point review of systems negative.   Past Medical History:  Diagnosis Date  . Astigmatism of both eyes 11/01/2016  . Bronchitis   . Coronary artery disease   . Cortical age-related cataract of both eyes 11/01/2016  . Diabetes mellitus without complication ([place])    type 2  . Drug-induced osteoporosis   . Dyslipidemia   . Fatigue   . Fatty liver   . History of kidney stones   . Hypercholesteremia   . Hypogonadism male   . 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 Normal nuclear stress test March 2011  . Nuclear sclerotic cataract of both eyes 11/01/2016  . Obesity   . Prostate cancer [facility])    s/p seed implant and radiation  . Sleep apnea    90% of  time dosent wear his cpap per pt  . Vitamin D insufficiency     Past Surgical History:  Procedure Laterality Date  . BUBBLE STUDY  12/30/2019   Procedure: BUBBLE STUDY;  Surgeon: Pixie Casino, MD;  Location: [facility];  Service: Cardiovascular;;  . CARDIAC CATHETERIZATION  April 2000  . IR IMAGING GUIDED PORT INSERTION  07/30/2019  . IR REMOVAL TUN ACCESS W/ PORT W/O FL MOD SED  12/26/2019  . RADIOACTIVE SEED IMPLANT  2009   Implanted radiation seeds  . TEE WITHOUT CARDIOVERSION N/A 12/30/2019   Procedure: TRANSESOPHAGEAL ECHOCARDIOGRAM (TEE);  Surgeon: Pixie Casino, MD;  [facility] END[facility]COPY;  Service: Cardiovascular;  Laterality: N/A;  . thumb surgery     as a teen caught thumb in machine  . TRANSURETHRAL RESECTION OF BLADDER TUMOR N/A 06/22/2019   Procedure: TRANSURETHRAL RESECTION OF BLADDER TUMOR (TURBT), CYSTOSCOPY,  BILATERAL RETROGRADE PYELOGRAPHY, BILATERAL STENT PLACEMENT;  Surgeon: Raynelle Bring, MD;  Location: [facility] ORS;  Service: Urology;  Laterality: N/A;  GENERAL WITH PARALYSIS     reports that he quit smoking about 20 years ago. His smoking use included cigarettes. He has a 40.00 pack-year smoking history. He has never used smokeless tobacco. He reports current alcohol use of about 1.0 standard drinks of alcohol per week. He reports that he does not use drugs.  Allergies  Allergen Reactions  . Ace Inhibitors Cough  . Cephalexin Hives, Diarrhea  and Nausea Only    Tolerates Ancef, Rocephin, Cefoxitin  . Crestor [Rosuvastatin Calcium]   . Lipitor [Atorvastatin Calcium] Other (See Comments)    MUSCLE PAINS   . Plavix [Clopidogrel Bisulfate] Hives  . Vancomycin Nausea And Vomiting    Po route caused n/v.     Family History  Problem Relation Age of Onset  . COPD Father     Prior to Admission medications   Medication Sig Start Date End Date Taking? Authorizing Provider  enoxaparin (LOVENOX) 40 MG/0.4ML injection Inject 40 mg into the skin daily.   Yes  [provider]  VICTOZA 18 MG/3ML SOPN INJECT 1.8 MG UNDER THE SKIN ONCE DAILY Patient taking differently: Inject 1.8 mg into the skin daily.  12/01/19  Yes [name], MD  enoxaparin (LOVENOX) 40 MG/0.4ML injection Inject 0.4 mLs (40 mg total) into the skin daily. 12/19/19 01/18/20  Raynelle Bring, MD  fidaxomicin (DIFICID) 200 MG TABS tablet Take 1 tablet (200 mg total) by mouth 2 (two) times daily. Patient not taking: Reported on 01/26/2020 01/04/20   Campbell Riches, MD  Lancets [facility] ULTRASOFT) lancets 1 each by Other route as needed. Use as instructed 12/07/19   [name], MD  losartan (COZAAR) 100 MG tablet Take 1 tablet (100 mg total) by mouth daily. Patient not taking: Reported on 02/08/2020 06/03/19   [name], MD  metFORMIN (GLUCOPHAGE) 1000 MG tablet TAKE 1 TABLET BY MOUTH TWICE A DAY WITH MEALS Patient not taking: Reported on 02/08/2020 12/01/19   [name], MD  ONE [facility] ULTRA TEST test strip USE TO TEST BLOOD SUGAR DAILY AS DIRECTED BY DOCTOR 12/01/18   [name], MD  Pitavastatin Calcium (LIVALO) 4 MG TABS Take 1 tablet (4 mg total) by mouth daily. Patient not taking: Reported on 02/08/2020 06/03/19   [name], MD  traMADol (ULTRAM) 50 MG tablet Take 1-2 tablets (50-100 mg total) by mouth every 6 (six) hours as needed for moderate pain. Patient not taking: Reported on 02/08/2020 12/18/19   Raynelle Bring, MD  traMADol (ULTRAM) 50 MG tablet Take 1-2 tablets (50-100 mg total) by mouth every 6 (six) hours as needed (pain). Patient not taking: Reported on 01/26/2020 01/01/20   Raynelle Bring, MD    Physical Exam: Vitals:   02/08/20 1730 02/08/20 1830 02/08/20 2009 02/08/20 2030  BP: 129/79 124/77 107/75 120/80  Pulse: (!) 102 (!) 107 (!) 104 (!) 105  Resp: (!) 21 (!) 25 (!) 25 (!) 28  Temp:      TempSrc:      SpO2: 90% 91% 90% 93%  Weight:          Constitutional: NAD, calm, comfortable Vitals:   02/08/20 1730 02/08/20 1830 02/08/20 2009  02/08/20 2030  BP: 129/79 124/77 107/75 120/80  Pulse: (!) 102 (!) 107 (!) 104 (!) 105  Resp: (!) 21 (!) 25 (!) 25 (!) 28  Temp:      TempSrc:      SpO2: 90% 91% 90% 93%  Weight:       Eyes: PERRL, lids and conjunctivae normal ENMT: Mucous membranes are moist. Posterior pharynx clear of any exudate or lesions.Normal dentition.  Neck: normal, supple, no masses, no thyromegaly Respiratory: clear to auscultation bilaterally, no wheezing, no crackles. Normal respiratory effort. No accessory muscle use.  Cardiovascular: Regular rate and rhythm, no murmurs / rubs / gallops. No extremity edema. 2+ pedal pulses. No carotid bruits.  Abdomen: no tenderness, no masses palpated.  No hepatosplenomegaly. Bowel sounds positive.  Wound to abdomen chronic and well healing Musculoskeletal: no clubbing / cyanosis. No joint deformity upper and lower extremities. Good ROM, no contractures. Normal muscle tone.  Skin: no rashes, lesions, ulcers. No induration Neurologic: CN 2-12 grossly intact. Sensation intact, DTR normal. Strength 5/5 in all 4.  Psychiatric: Normal judgment and insight. Alert and oriented x 3. Normal mood.    Labs on Admission: I have personally reviewed following labs and imaging studies  CBC: Recent Labs  Lab 02/08/20 1545  WBC 5.3  HGB 12.7*  HCT 40.4  MCV 91.6  PLT [id number]   Basic Metabolic Panel: Recent Labs  Lab 02/08/20 1545  NA 137  K 4.7  CL 109  CO2 18*  GLUCOSE 122*  BUN 47*  CREATININE 1.35*  CALCIUM 9.4   GFR: Estimated Creatinine Clearance: 54.3 mL/min (A) (by C-G formula based on SCr of 1.35 mg/dL (H)). Liver Function Tests: Recent Labs  Lab 02/08/20 1545  AST 41  ALT 22  ALKPHOS 134*  BILITOT 0.7  PROT 7.6  ALBUMIN 3.2*   No results for input(s): LIPASE, AMYLASE in the last 168 hours. Recent Labs  Lab 02/08/20 1545  AMMONIA 54*   Coagulation Profile: No results for input(s): INR, PROTIME in the last 168 hours. Cardiac Enzymes: No results for  input(s): CKTOTAL, CKMB, CKMBINDEX, TROPONINI in the last 168 hours. BNP (last 3 results) No results for input(s): PROBNP in the last 8760 hours. HbA1C: No results for input(s): HGBA1C in the last 72 hours. CBG: Recent Labs  Lab 02/08/20 1548  GLUCAP 110*   Lipid Profile: No results for input(s): CHOL, HDL, LDLCALC, TRIG, CHOLHDL, LDLDIRECT in the last 72 hours. Thyroid Function Tests: No results for input(s): TSH, T4TOTAL, FREET4, T3FREE, THYROIDAB in the last 72 hours. Anemia Panel: No results for input(s): VITAMINB12, FOLATE, FERRITIN, TIBC, IRON, RETICCTPCT in the last 72 hours. Urine analysis:    Component Value Date/Time   COLORURINE YELLOW 02/08/2020 1545   APPEARANCEUR CLOUDY (A) 02/08/2020 1545   LABSPEC 1.012 02/08/2020 1545   PHURINE 6.0 02/08/2020 1545   GLUCOSEU NEGATIVE 02/08/2020 1545   HGBUR NEGATIVE 02/08/2020 1545   BILIRUBINUR NEGATIVE 02/08/2020 1545   BILIRUBINUR n 06/08/2016 1109   KETONESUR NEGATIVE 02/08/2020 1545   PROTEINUR NEGATIVE 02/08/2020 1545   UROBILINOGEN negative 06/08/2016 1109   NITRITE NEGATIVE 02/08/2020 1545   LEUKOCYTESUR LARGE (A) 02/08/2020 1545   Sepsis Labs: !!!!!!!!!!!!!!!!!!!!!!!!!!!!!!!!!!!!!!!!!!!! @LABRCNTIP (procalcitonin:4,lacticidven:4) ) Recent Results (from the past 240 hour(s))  Respiratory Panel by RT PCR (Flu A&B, Covid) - Nasopharyngeal Swab     Status: Abnormal   Collection Time: 02/08/20  3:14 PM   Specimen: Nasopharyngeal Swab  Result Value Ref Range Status   SARS Coronavirus 2 by RT PCR POSITIVE (A) NEGATIVE Final    Comment: RESULT CALLED TO, READ BACK BY AND VERIFIED WITH: HODGES,I. RN @2010  ON 02.08.2021 BY COHEN,K (NOTE) SARS-CoV-2 target nucleic acids are DETECTED. SARS-CoV-2 RNA is generally detectable in upper respiratory specimens  during the acute phase of infection. Positive results are indicative of the presence of the identified virus, but do not rule out bacterial infection or co-infection  with other pathogens not detected by the test. Clinical correlation with patient history and other diagnostic information is necessary to determine patient infection status. The expected result is Negative. Fact Sheet for Patients:  PinkCheek.be Fact Sheet for Healthcare Providers: GravelBags.it This test is not yet approved or cleared by the [place] FDA and  has been  authorized for detection and/or diagnosis of SARS-CoV-2 by FDA under an Emergency Use Authorization (EUA).  This EUA will remain in effect (meaning this test can be  used) for the duration of  the COVID-19 declaration under Section 564(b)(1) of the Act, 21 U.S.C. section 360bbb-3(b)(1), unless the authorization is terminated or revoked sooner.    Influenza A by PCR NEGATIVE NEGATIVE Final   Influenza B by PCR NEGATIVE NEGATIVE Final    Comment: (NOTE) The Xpert Xpress SARS-CoV-2/FLU/RSV assay is intended as an aid in  the diagnosis of influenza from Nasopharyngeal swab specimens and  should not be used as a sole basis for treatment. Nasal washings and  aspirates are unacceptable for Xpert Xpress SARS-CoV-2/FLU/RSV  testing. Fact Sheet for Patients: PinkCheek.be Fact Sheet for Healthcare Providers: GravelBags.it This test is not yet approved or cleared by the [place] FDA and  has been authorized for detection and/or diagnosis of SARS-CoV-2 by  FDA under an Emergency Use Authorization (EUA). This EUA will remain  in effect (meaning this test can be used) for the duration of the  Covid-19 declaration under Section 564(b)(1) of the Act, 21  U.S.C. section 360bbb-3(b)(1), unless the authorization is  terminated or revoked. Performed at [facility], [place] [address] [place], [place] [address]      Radiological Exams on Admission: CT Head Wo Contrast  Result Date:  02/08/2020 CLINICAL DATA:  Altered mental status EXAM: CT HEAD WITHOUT CONTRAST TECHNIQUE: Contiguous axial images were obtained from the base of the skull through the vertex without intravenous contrast. COMPARISON:  MRI 06/04/2017 FINDINGS: Brain: There is atrophy and chronic small vessel disease changes. No acute intracranial abnormality. Specifically, no hemorrhage, hydrocephalus, mass lesion, acute infarction, or significant intracranial injury. Vascular: No hyperdense vessel or unexpected calcification. Skull: No acute calvarial abnormality. Sinuses/Orbits: Visualized paranasal sinuses and mastoids clear. Orbital soft tissues unremarkable. Other: None IMPRESSION: Atrophy, chronic microvascular disease. No acute intracranial abnormality. Electronically Signed   By: Rolm Baptise M.D.   On: 02/08/2020 19:08   CT Angio Chest PE W and/or Wo Contrast  Result Date: 02/08/2020 CLINICAL DATA:  High probability pulmonary embolus. History of cancer. New shortness of breath with hypoxia and tachycardia. Recent RIGHT calf pain. Two weeks of confusion and weakness. EXAM: CT ANGIOGRAPHY CHEST WITH CONTRAST TECHNIQUE: Multidetector CT imaging of the chest was performed using the standard protocol during bolus administration of intravenous contrast. Multiplanar CT image reconstructions and MIPs were obtained to evaluate the vascular anatomy. CONTRAST:  31mL OMNIPAQUE IOHEXOL 350 MG/ML SOLN COMPARISON:  11/02/2019 FINDINGS: Cardiovascular: Pulmonary arteries are well opacified by contrast bolus. There is no acute pulmonary embolus. Heart size is within normal limits. There is atherosclerotic calcification of the thoracic aorta, not associated with aneurysm. No pericardial effusion. Mediastinum/Nodes: Esophagus is unremarkable. No significant mediastinal, hilar, or axillary adenopathy. Lungs/Pleura: Again noted are scattered areas of paraseptal emphysema, vertically within the lung apices and are subpleural reticulation.  Superimposed on the chronic changes are patchy ground-glass opacities with a peripheral predominance, favoring infectious process over pulmonary edema. No pleural effusions. No frank consolidations. No suspicious pulmonary nodules. Upper Abdomen: No acute abnormality. Musculoskeletal: Midthoracic degenerative changes. Review of the MIP images confirms the above findings. IMPRESSION: 1. Technically adequate exam showing no acute pulmonary embolus. 2. Chronic changes of paraseptal emphysema and subpleural reticulation. 3. Patchy ground-glass opacities with a peripheral predominance, favoring infectious process over pulmonary edema. 4. Aortic Atherosclerosis (ICD10-I70.0). 5. Emphysema (ICD10-J43.9). Electronically Signed   By: Nolon Nations  M.D.   On: 02/08/2020 19:14   DG Chest Portable 1 View  Result Date: 02/08/2020 CLINICAL DATA:  Short of breath, fatigue, altered level of consciousness EXAM: PORTABLE CHEST 1 VIEW COMPARISON:  12/23/2019 FINDINGS: Single frontal view of the chest demonstrates a stable cardiac silhouette. Diffuse interstitial scarring is again seen throughout the lungs without focal consolidation, effusion, or pneumothorax. There are no acute bony abnormalities. IMPRESSION: 1. Chronic parenchymal scarring and fibrosis. No acute airspace disease. Electronically Signed   By: Randa Ngo M.D.   On: 02/08/2020 16:08   VAS [place] LOWER EXTREMITY VENOUS (DVT) (ONLY [facility] & [facility])  Result Date: 02/08/2020  Lower Venous DVTStudy Indications: Pain.  Limitations: Body habitus and poor ultrasound/tissue interface. Comparison Study: No prior studies. Performing Technologist: Oliver Hum RVT  Examination Guidelines: A complete evaluation includes B-mode imaging, spectral Doppler, color Doppler, and power Doppler as needed of all accessible portions of each vessel. Bilateral testing is considered an integral part of a complete examination. Limited examinations for reoccurring indications may be performed  as noted. The reflux portion of the exam is performed with the patient in reverse Trendelenburg.  +---------+---------------+---------+-----------+----------+--------------+ RIGHT    CompressibilityPhasicitySpontaneityPropertiesThrombus Aging +---------+---------------+---------+-----------+----------+--------------+ CFV      Full           Yes      Yes                                 +---------+---------------+---------+-----------+----------+--------------+ SFJ      Full                                                        +---------+---------------+---------+-----------+----------+--------------+ FV Prox  Full                                                        +---------+---------------+---------+-----------+----------+--------------+ FV Mid   Full                                                        +---------+---------------+---------+-----------+----------+--------------+ FV DistalFull                                                        +---------+---------------+---------+-----------+----------+--------------+ PFV      Full                                                        +---------+---------------+---------+-----------+----------+--------------+ POP      Full           Yes      Yes                                 +---------+---------------+---------+-----------+----------+--------------+  PTV      Full                                                        +---------+---------------+---------+-----------+----------+--------------+ PERO     Full                                                        +---------+---------------+---------+-----------+----------+--------------+   +---------+---------------+---------+-----------+----------+--------------+ LEFT     CompressibilityPhasicitySpontaneityPropertiesThrombus Aging +---------+---------------+---------+-----------+----------+--------------+ CFV      Full            Yes      Yes                                 +---------+---------------+---------+-----------+----------+--------------+ SFJ      Full                                                        +---------+---------------+---------+-----------+----------+--------------+ FV Prox  Full                                                        +---------+---------------+---------+-----------+----------+--------------+ FV Mid   Full                                                        +---------+---------------+---------+-----------+----------+--------------+ FV DistalFull                                                        +---------+---------------+---------+-----------+----------+--------------+ PFV      Full                                                        +---------+---------------+---------+-----------+----------+--------------+ POP      Partial        No       No                   Acute          +---------+---------------+---------+-----------+----------+--------------+ PTV      Full                                                        +---------+---------------+---------+-----------+----------+--------------+  PERO                                                  Not visualized +---------+---------------+---------+-----------+----------+--------------+     Summary: RIGHT: - There is no evidence of deep vein thrombosis in the lower extremity. However, portions of this examination were limited- see technologist comments above.  - No cystic structure found in the popliteal fossa.  LEFT: - Findings consistent with acute deep vein thrombosis involving the left popliteal vein. - No cystic structure found in the popliteal fossa.  *See table(s) above for measurements and observations.    Preliminary    Old chart reviewed Case discussed with Dr. Kathrynn Humble in the emergency department Chest x-ray reviewed no edema or infiltrate however CTA shows bilateral  opacities   Assessment/Plan 74 year old male with acute proximal respiratory failure secondary to bilateral Covid pneumonia also with DVT Principal Problem:   Pneumonia due to COVID-19 virus-patient agreeable to remdesivir and Decadron.  Placed on vitamin C and zinc.  Fully anticoagulated for DVT.  With heparin.  CTA negative for PE.  Supplemental oxygen currently on 6 L.  Mental status clear at this time.  Likely altered secondary to hypoxia.  Transfer to [facility].  Active Problems:   Acute respiratory failure due to COVID-19 (HCC)-as above    Hypertension associated with type 2 diabetes mellitus (HCC)-clarify and resume home meds    Malignant neoplasm of urinary bladder (HCC)-no need for urology input at this time.  Wound appears well-healing.  Transfer to [facility].  DVT left lower extremity-Heparin drip  DVT prophylaxis: Heparin Code Status: Full Family Communication: None Disposition Plan: Days Consults called: None Admission status: Admission   Desiray Orchard A MD [facility]  If 7PM-7AM, please contact night-coverage [email or web address] Password [facility]  02/08/2020, 8:55 PM

## 2020-02-08 NOTE — ED Notes (Signed)
Called report to [facility], receiving nurse asked for verification as to whether or not pt is still actively getting chemotherapy. This nurse called pts wife to confirm ending chemo in august 2020. This information was communicated with receiving nurse Lauren at [facility].

## 2020-02-08 NOTE — ED Provider Notes (Signed)
[facility] DEPT Provider Note   CSN: [id number] Arrival date & time: 02/08/20  1444     History Chief Complaint  Patient presents with  . Shortness of Breath  . Fatigue  . Altered Mental Status    Jimmy [name] is a 74 y.o. male.  The history is provided by the patient and the EMS personnel.  Shortness of Breath Severity:  Severe Onset quality:  Gradual Duration:  4 days Timing:  Constant Progression:  Worsening Chronicity:  New Relieved by:  Oxygen Worsened by:  Nothing Ineffective treatments:  None tried Associated symptoms: no abdominal pain, no chest pain, no cough, no diaphoresis, no fever, no headaches, no rash, no vomiting and no wheezing   Risk factors: hx of cancer   Risk factors: no hx of PE/DVT        Past Medical History:  Diagnosis Date  . Astigmatism of both eyes 11/01/2016  . Bronchitis   . Coronary artery disease   . Cortical age-related cataract of both eyes 11/01/2016  . Diabetes mellitus without complication ([place])    type 2  . Drug-induced osteoporosis   . Dyslipidemia   . Fatigue   . Fatty liver   . History of kidney stones   . Hypercholesteremia   . Hypogonadism male   . 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 Normal nuclear stress test March 2011  . Nuclear sclerotic cataract of both eyes 11/01/2016  . Obesity   . Prostate cancer [facility])    s/p seed implant and radiation  . Sleep apnea    90% of time dosent wear his cpap per pt  . Vitamin D insufficiency     Patient Active Problem List   Diagnosis Date Noted  . MSSA bacteremia 01/26/2020  . Bladder cancer ([place]) 12/10/2019  . Port-A-Cath in place 08/07/2019  . Goals of care, counseling/discussion 07/24/2019  . Malignant neoplasm of urinary bladder ([place]) 07/24/2019  . Senile purpura ([place]) 06/03/2019  . Solitary pulmonary nodule present on computed tomography of  lung 04/02/2018  . Type II diabetes mellitus with complication ([place]) [id number]  . Osteoporosis due to androgen therapy 09/16/2012  . Coronary artery disease involving native coronary artery of native heart without angina pectoris 08/29/2011  . Morbid obesity ([place]) 08/29/2011  . History of prostate cancer 08/29/2011  . Sleep apnea 08/29/2011  . Hypertension associated with type 2 diabetes mellitus ([place]) 08/29/2011  . Hyperlipidemia associated with type 2 diabetes mellitus ([place]) 08/29/2011    Past Surgical History:  Procedure Laterality Date  . BUBBLE STUDY  12/30/2019   Procedure: BUBBLE STUDY;  Surgeon: Pixie Casino, MD;  Location: [facility];  Service: Cardiovascular;;  . CARDIAC CATHETERIZATION  April 2000  . IR IMAGING GUIDED PORT INSERTION  07/30/2019  . IR REMOVAL TUN ACCESS W/ PORT W/O FL MOD SED  12/26/2019  . RADIOACTIVE SEED IMPLANT  2009   Implanted radiation seeds  . TEE WITHOUT CARDIOVERSION N/A 12/30/2019   Procedure: TRANSESOPHAGEAL ECHOCARDIOGRAM (TEE);  Surgeon: Pixie Casino, MD;  Location: [facility] ENDOSCOPY;  Service: Cardiovascular;  Laterality: N/A;  . thumb surgery     as a teen caught thumb in machine  . TRANSURETHRAL RESECTION OF BLADDER TUMOR N/A 06/22/2019   Procedure: TRANSURETHRAL RESECTION OF BLADDER TUMOR (TURBT), CYSTOSCOPY,  BILATERAL RETROGRADE PYELOGRAPHY, BILATERAL STENT PLACEMENT;  Surgeon: Raynelle Bring, MD;  Location: [facility] ORS;  Service: Urology;  Laterality: N/A;  GENERAL WITH PARALYSIS       Family History  Problem Relation Age of Onset  . COPD Father     Social History   Tobacco Use  . Smoking status: Former Smoker    Packs/day: 1.00    Years: 40.00    Pack years: 40.00    Types: Cigarettes    Quit date: 08/20/1999    Years since quitting: 20.4  . Smokeless tobacco: Never Used  Substance Use Topics  . Alcohol use: Yes    Alcohol/week: 1.0 standard drinks    Types: 1 Shots of liquor per week    Comment: occasional  . Drug use:  No    Home Medications Prior to Admission medications   Medication Sig Start Date End Date Taking? Authorizing Provider  enoxaparin (LOVENOX) 40 MG/0.4ML injection Inject 0.4 mLs (40 mg total) into the skin daily. 12/19/19 01/18/20  Raynelle Bring, MD  fidaxomicin (DIFICID) 200 MG TABS tablet Take 1 tablet (200 mg total) by mouth 2 (two) times daily. Patient not taking: Reported on 01/26/2020 01/04/20   Campbell Riches, MD  icosapent Ethyl (VASCEPA) 1 g capsule Take 2 g by mouth 2 (two) times daily.    [provider]  Lancets [facility] ULTRASOFT) lancets 1 each by Other route as needed. Use as instructed 12/07/19   [name], MD  losartan (COZAAR) 100 MG tablet Take 1 tablet (100 mg total) by mouth daily. 06/03/19   [name], MD  metFORMIN (GLUCOPHAGE) 1000 MG tablet TAKE 1 TABLET BY MOUTH TWICE A DAY WITH MEALS Patient taking differently: Take 1,000 mg by mouth daily with breakfast.  12/01/19   [name], MD  Nutritional Supplements (VITAMIN D BOOSTER PO) Take 5,000 Units by mouth.    [provider]  ONE TOUCH ULTRA TEST test strip USE TO TEST BLOOD SUGAR DAILY AS DIRECTED BY DOCTOR 12/01/18   [name], MD  Pitavastatin Calcium (LIVALO) 4 MG TABS Take 1 tablet (4 mg total) by mouth daily. 06/03/19   [name], MD  traMADol (ULTRAM) 50 MG tablet Take 1-2 tablets (50-100 mg total) by mouth every 6 (six) hours as needed for moderate pain. 12/18/19   Raynelle Bring, MD  traMADol (ULTRAM) 50 MG tablet Take 1-2 tablets (50-100 mg total) by mouth every 6 (six) hours as needed (pain). Patient not taking: Reported on 01/26/2020 01/01/20   Raynelle Bring, MD  VICTOZA 18 MG/3ML SOPN INJECT 1.8 MG UNDER THE SKIN ONCE DAILY Patient taking differently: Inject 1.8 mg into the skin daily.  12/01/19   [name], MD    Allergies    Ace inhibitors, Cephalexin, Crestor [rosuvastatin calcium], Lipitor [atorvastatin calcium], Plavix [clopidogrel bisulfate], and  Vancomycin  Review of Systems   Review of Systems  Constitutional: Positive for fatigue. Negative for chills, diaphoresis and fever.  HENT: Negative for congestion.   Respiratory: Positive for shortness of breath. Negative for cough, chest tightness, wheezing and stridor.   Cardiovascular: Negative for chest pain.  Gastrointestinal: Negative for abdominal pain, constipation, diarrhea, nausea and vomiting.  Genitourinary: Negative for dysuria.       Ostomy for urine  Musculoskeletal: Negative for back pain.  Skin: Negative for rash and wound.  Neurological: Negative for headaches.  Psychiatric/Behavioral: Positive for confusion.  All other systems reviewed and are negative.   Physical Exam Updated Vital Signs BP 120/71   Pulse (!) 107   Temp 97.9 F (36.6 C) (  Oral)   Resp (!) 23   Wt 94.5 kg   SpO2 91%   BMI 31.68 kg/m   Physical Exam Constitutional:      General: He is not in acute distress.    Appearance: He is well-developed. He is not ill-appearing, toxic-appearing or diaphoretic.  HENT:     Head: Normocephalic and atraumatic.     Right Ear: External ear normal.     Left Ear: External ear normal.     Nose: Nose normal.     Mouth/Throat:     Mouth: Mucous membranes are moist.     Pharynx: No pharyngeal swelling or oropharyngeal exudate.  Eyes:     Extraocular Movements: Extraocular movements intact.     Conjunctiva/sclera: Conjunctivae normal.     Pupils: Pupils are equal, round, and reactive to light.  Cardiovascular:     Rate and Rhythm: Regular rhythm. Tachycardia present.     Pulses: Normal pulses.  Pulmonary:     Effort: Tachypnea present. No respiratory distress.     Breath sounds: No stridor. Rhonchi present. No decreased breath sounds, wheezing or rales.  Chest:     Chest wall: No tenderness.  Abdominal:     Palpations: Abdomen is soft.     Tenderness: There is no abdominal tenderness. There is no guarding or rebound.  Musculoskeletal:     Cervical  back: Normal range of motion and neck supple.     Right lower leg: Tenderness present. No edema.     Left lower leg: No tenderness. No edema.  Skin:    General: Skin is warm.     Findings: No erythema or rash.  Neurological:     Mental Status: He is alert.     Cranial Nerves: No cranial nerve deficit.     Motor: No abnormal muscle tone.     Coordination: Coordination normal.     Deep Tendon Reflexes: Reflexes normal.  Psychiatric:        Mood and Affect: Mood normal.     ED Results / Procedures / Treatments   Labs (all labs ordered are listed, but only abnormal results are displayed) Labs Reviewed  BASIC METABOLIC PANEL - Abnormal; Notable for the following components:      Result Value   CO2 18 (*)    Glucose, Bld 122 (*)    BUN 47 (*)    Creatinine, Ser 1.35 (*)    GFR calc non Af Amer 52 (*)    GFR calc Af Amer 60 (*)    All other components within normal limits  CBC - Abnormal; Notable for the following components:   Hemoglobin 12.7 (*)    RDW 15.9 (*)    All other components within normal limits  URINALYSIS, ROUTINE W REFLEX MICROSCOPIC - Abnormal; Notable for the following components:   APPearance CLOUDY (*)    Leukocytes,Ua LARGE (*)    Bacteria, UA RARE (*)    All other components within normal limits  HEPATIC FUNCTION PANEL - Abnormal; Notable for the following components:   Albumin 3.2 (*)    Alkaline Phosphatase 134 (*)    All other components within normal limits  AMMONIA - Abnormal; Notable for the following components:   Ammonia 54 (*)    All other components within normal limits  CBG MONITORING, ED - Abnormal; Notable for the following components:   Glucose-Capillary 110 (*)    All other components within normal limits  URINE CULTURE  RESPIRATORY PANEL BY RT PCR (FLU A&B,  COVID)  BRAIN NATRIURETIC PEPTIDE  APTT  PROTIME-INR  HEPARIN LEVEL (UNFRACTIONATED)  CBC  POC SARS CORONAVIRUS 2 AG -  ED  TROPONIN I (HIGH SENSITIVITY)  TROPONIN I (HIGH  SENSITIVITY)    EKG EKG Interpretation  Date/Time:  Monday February 08 2020 14:58:01 EST Ventricular Rate:  109 PR Interval:    QRS Duration: 91 QT Interval:  321 QTC Calculation: 433 R Axis:   -15 Text Interpretation: Sinus tachycardia Borderline left axis deviation Low voltage, extremity leads Abnormal R-wave progression, early transition When compared to prior, no significant cahnges seen. NO STEMI Confirmed by Antony Blackbird [phone]) on 02/08/2020 3:07:24 PM   Radiology DG Chest Portable 1 View  Result Date: 02/08/2020 CLINICAL DATA:  Short of breath, fatigue, altered level of consciousness EXAM: PORTABLE CHEST 1 VIEW COMPARISON:  12/23/2019 FINDINGS: Single frontal view of the chest demonstrates a stable cardiac silhouette. Diffuse interstitial scarring is again seen throughout the lungs without focal consolidation, effusion, or pneumothorax. There are no acute bony abnormalities. IMPRESSION: 1. Chronic parenchymal scarring and fibrosis. No acute airspace disease. Electronically Signed   By: Randa Ngo M.D.   On: 02/08/2020 16:08   VAS [place] LOWER EXTREMITY VENOUS (DVT) (ONLY [facility] & [facility])  Result Date: 02/08/2020  Lower Venous DVTStudy Indications: Pain.  Limitations: Body habitus and poor ultrasound/tissue interface. Comparison Study: No prior studies. Performing Technologist: Oliver Hum RVT  Examination Guidelines: A complete evaluation includes B-mode imaging, spectral Doppler, color Doppler, and power Doppler as needed of all accessible portions of each vessel. Bilateral testing is considered an integral part of a complete examination. Limited examinations for reoccurring indications may be performed as noted. The reflux portion of the exam is performed with the patient in reverse Trendelenburg.  +---------+---------------+---------+-----------+----------+--------------+ RIGHT    CompressibilityPhasicitySpontaneityPropertiesThrombus Aging  +---------+---------------+---------+-----------+----------+--------------+ CFV      Full           Yes      Yes                                 +---------+---------------+---------+-----------+----------+--------------+ SFJ      Full                                                        +---------+---------------+---------+-----------+----------+--------------+ FV Prox  Full                                                        +---------+---------------+---------+-----------+----------+--------------+ FV Mid   Full                                                        +---------+---------------+---------+-----------+----------+--------------+ FV DistalFull                                                        +---------+---------------+---------+-----------+----------+--------------+  PFV      Full                                                        +---------+---------------+---------+-----------+----------+--------------+ POP      Full           Yes      Yes                                 +---------+---------------+---------+-----------+----------+--------------+ PTV      Full                                                        +---------+---------------+---------+-----------+----------+--------------+ PERO     Full                                                        +---------+---------------+---------+-----------+----------+--------------+   +---------+---------------+---------+-----------+----------+--------------+ LEFT     CompressibilityPhasicitySpontaneityPropertiesThrombus Aging +---------+---------------+---------+-----------+----------+--------------+ CFV      Full           Yes      Yes                                 +---------+---------------+---------+-----------+----------+--------------+ SFJ      Full                                                         +---------+---------------+---------+-----------+----------+--------------+ FV Prox  Full                                                        +---------+---------------+---------+-----------+----------+--------------+ FV Mid   Full                                                        +---------+---------------+---------+-----------+----------+--------------+ FV DistalFull                                                        +---------+---------------+---------+-----------+----------+--------------+ PFV      Full                                                        +---------+---------------+---------+-----------+----------+--------------+  POP      Partial        No       No                   Acute          +---------+---------------+---------+-----------+----------+--------------+ PTV      Full                                                        +---------+---------------+---------+-----------+----------+--------------+ PERO                                                  Not visualized +---------+---------------+---------+-----------+----------+--------------+     Summary: RIGHT: - There is no evidence of deep vein thrombosis in the lower extremity. However, portions of this examination were limited- see technologist comments above.  - No cystic structure found in the popliteal fossa.  LEFT: - Findings consistent with acute deep vein thrombosis involving the left popliteal vein. - No cystic structure found in the popliteal fossa.  *See table(s) above for measurements and observations.    Preliminary     Procedures Procedures (including critical care time)  Medications Ordered in ED Medications  sodium chloride flush (NS) 0.9 % injection 3 mL (has no administration in time range)    ED Course  I have reviewed the triage vital signs and the nursing notes.  Pertinent labs & imaging results that were available during my care of the patient were  reviewed by me and considered in my medical decision making (see chart for details).    MDM Rules/Calculators/A&P                      Jimmy [name] is a 74 y.o. male with a past medical history significant for prostate cancer, hypertension, hyperlipidemia, diabetes, bladder cancer status post surgery with ostomy who presents with altered mental status and hypoxia.  According to EMS report to nursing, patient has been confused worsening for the last few days and was found to be hypoxic on room air today.  He is not take oxygen normally.  Patient was found to have oxygen saturations in the 70s and 80s with EMS and was on nonrebreather on arrival.  Patient was weaned down to 8 L nasal cannula on my initial evaluation.    On my exam, lungs have some coarseness but there is no wheezing.  Chest was nontender and abdomen was nontender.  Ostomy appears to be in place.  Patient had good pulses in both lower extremities however he does report that he has had some right calf pain recently.  He had some right calf tenderness.    He denies any fevers, chills, congestion, or cough.  He is somnolent but arousable to voice.   Given his cancer history, tachycardia, hypoxia, and right calf pain, I am clinically concerned about thromboembolic etiology of his symptoms.  Patient will have an ultrasound as well as CT PE study.  Due to the altered mental status, he will have a head CT before heparin is ordered if he is positive.  Patient denies any chest pain at this time.  He is resting comfortably.  Ultrasound returned showing he is positive for DVT in the right leg.  Will wait on the head CT before heparinization.  Anticipate admission for the hypoxia with venous thromboembolism.  Care transferred to Dr. Kathrynn Humble while awaiting for results.  Care transferred in stable condition.   Final Clinical Impression(s) / ED Diagnoses Final diagnoses:  Hypoxia  Shortness of breath  Somnolence  Acute deep vein  thrombosis (DVT) of right lower extremity, unspecified vein (HCC)    Clinical Impression: 1. Hypoxia   2. Shortness of breath   3. Somnolence   4. Acute deep vein thrombosis (DVT) of right lower extremity, unspecified vein (HCC)     Disposition: Care transferred to Dr. Kathrynn Humble while awaiting for results.  Care transferred in stable condition.   This note was prepared with assistance of Systems analyst. Occasional wrong-word or sound-a-like substitutions may have occurred due to the inherent limitations of voice recognition software.      Shaniquia Brafford, Gwenyth Allegra, MD 02/08/20 [phone]

## 2020-02-08 NOTE — ED Notes (Signed)
Date and time results received: 02/08/20 8:11 PM  (use smartphrase ".now" to insert current time)  Test: COVID Critical Value: Positive  Name of Provider Notified:Dr.Nanavati  Orders Received? Or Actions Taken?:

## 2020-02-08 NOTE — ED Provider Notes (Addendum)
  Physical Exam  BP 120/71   Pulse (!) 107   Temp 97.9 F (36.6 C) (Oral)   Resp (!) 23   Wt 94.5 kg   SpO2 91%   BMI 31.68 kg/m   Physical Exam  ED Course/Procedures     .Critical Care Performed by: Varney Biles, MD Authorized by: Varney Biles, MD   Critical care provider statement:    Critical care time (minutes):  32   Critical care was time spent personally by me on the following activities:  Discussions with consultants, evaluation of patient's response to treatment, examination of patient, ordering and performing treatments and interventions, ordering and review of laboratory studies, ordering and review of radiographic studies, pulse oximetry, re-evaluation of patient's condition, obtaining history from patient or surrogate and review of old charts    MDM    Assuming care of patient from Dr. Sherry Ruffing.   Patient in the ED for shortness of breath, confusion x 2-3 days. Requiring 8 liters [place]. Pt has urethral CA and  Workup thus far shows + DVT.  Concerning findings are as following : hypoxia. Important pending results are CT scan of the head and chest pending.  According to Dr. Sherry Ruffing, plan is to f/u on CT scan.   Patient had no complains, no concerns from the nursing side. Will continue to monitor.  Code status: full code   Varney Biles, MD 02/08/20 1736  7:30 PM: CT PE is negative. Covid test has still not resulted.  Suspect that patient is likely Covid positive.  8:15 PM: PCR Covid test is positive.  Patient will be admitted.  Deferring treatment to the admitting service.      SIGISMUND MESKILL was evaluated in Emergency Department on 02/08/2020 for the symptoms described in the history of present illness. He was evaluated in the context of the global COVID-19 pandemic, which necessitated consideration that the patient might be at risk for infection with the SARS-CoV-2 virus that causes COVID-19. Institutional protocols and algorithms that pertain  to the evaluation of patients at risk for COVID-19 are in a state of rapid change based on information released by regulatory bodies including the CDC and federal and state organizations. These policies and algorithms were followed during the patient's care in the ED.    8:45 PM  The patient appears reasonably screened and/or stabilized for discharge and I doubt any other medical condition or other [facility] requiring further screening, evaluation, or treatment in the ED at this time prior to discharge.   Results from the ER workup discussed with the patient face to face and all questions answered to the best of my ability. The patient is safe for discharge with strict return precautions.  Of note, patient also has a wound over his abdomen from his oncologic surgery by urologist.  He also had a Port-A-Cath infection.  Port-A-Cath has been removed.  Wife informed as well.   Varney Biles, MD 02/08/20 2045

## 2020-02-08 NOTE — ED Notes (Signed)
ED Provider at bedside. 

## 2020-02-08 NOTE — ED Notes (Signed)
[facility] dispatch notified for need of transport to [facility].

## 2020-02-08 NOTE — ED Triage Notes (Addendum)
EMS states pt is from home, wife reports about 2 weeks confusion and weakness, upon arrival sats 85% RA. Placed on NRB sats 96%. 136/78-100- 24 CBG 121

## 2020-02-08 NOTE — Progress Notes (Signed)
Bilateral lower extremity venous duplex has been completed. Preliminary results can be found in CV Proc through chart review.  Results were given to Dr. Sherry Ruffing.   02/08/20 4:28 PM Carlos Levering RVT

## 2020-02-08 NOTE — Telephone Encounter (Signed)
The nurse from [facility] called stating she was worried about the pt. Because he seems to be worse last two times she has been to see him, getting weaker, can stand but can not walk, pulse ox at 88% resting, can call wife [name] back at [phone], please advise.

## 2020-02-08 NOTE — Progress Notes (Signed)
ANTICOAGULATION CONSULT NOTE - Initial Consult  Pharmacy Consult for Heparin Indication: DVT  Allergies  Allergen Reactions  . Ace Inhibitors Cough  . Cephalexin Hives, Diarrhea and Nausea Only    Tolerates Ancef, Rocephin, Cefoxitin  . Crestor [Rosuvastatin Calcium]   . Lipitor [Atorvastatin Calcium] Other (See Comments)    MUSCLE PAINS   . Plavix [Clopidogrel Bisulfate] Hives  . Vancomycin Nausea And Vomiting    Po route caused n/v.     Patient Measurements: Weight: 208 lb 5.4 oz (94.5 kg) Heparin Dosing Weight: 85 kg  Vital Signs: Temp: 97.9 F (36.6 C) (02/08 1454) Temp Source: Oral (02/08 1454) BP: 124/77 (02/08 1830) Pulse Rate: 107 (02/08 1830)  Labs: Recent Labs    02/08/20 1545 02/08/20 1815  HGB 12.7*  --   HCT 40.4  --   PLT 250  --   CREATININE 1.35*  --   TROPONINIHS 6 6    Estimated Creatinine Clearance: 54.3 mL/min (A) (by C-G formula based on SCr of 1.35 mg/dL (H)).   Medical History: Past Medical History:  Diagnosis Date  . Astigmatism of both eyes 11/01/2016  . Bronchitis   . Coronary artery disease   . Cortical age-related cataract of both eyes 11/01/2016  . Diabetes mellitus without complication ([place])    type 2  . Drug-induced osteoporosis   . Dyslipidemia   . Fatigue   . Fatty liver   . History of kidney stones   . Hypercholesteremia   . Hypogonadism male   . 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 Normal nuclear stress test March 2011  . Nuclear sclerotic cataract of both eyes 11/01/2016  . Obesity   . Prostate cancer [facility])    s/p seed implant and radiation  . Sleep apnea    90% of time dosent wear his cpap per pt  . Vitamin D insufficiency     Medications:  PTA Lovenox 40mg  sq q24h - LD 02/08/20 @ 11:00  Assessment:  74 yr male presents with hypoxia, SOB, weakness.  PMH significant for prostate cancer, HTN, HLD, DM, bladder  cancer  Patient on Lovenox 40mg  sq q24h PTA for VTE prophylaxis  Venous doppler = + LLE DVT  Head CT negative for bleed  CTAngio negative for pulmonary embolism  Pharmacy consulted to dose IV heparin for DVT  Goal of Therapy:  Heparin level 0.3-0.7 units/ml Monitor platelets by anticoagulation protocol: Yes   Plan:   Obtain baseline aPTT and PT/INR  No heparin bolus as patient received Lovenox 40mg  dose < 12 hours ago  Begin IV heparin @ 1400 units/hr  Check heparin level 8 hr after IV heparin started  Check daily heparin level @ CBC  Malyn Aytes, Toribio Harbour, PharmD 02/08/2020,7:43 PM

## 2020-02-09 DIAGNOSIS — J1282 Pneumonia due to coronavirus disease 2019: Secondary | ICD-10-CM

## 2020-02-09 DIAGNOSIS — U071 COVID-19: Principal | ICD-10-CM

## 2020-02-09 LAB — CBC WITH DIFFERENTIAL/PLATELET
Abs Immature Granulocytes: 0.01 10*3/uL (ref 0.00–0.07)
Basophils Absolute: 0 10*3/uL (ref 0.0–0.1)
Basophils Relative: 0 %
Eosinophils Absolute: 0 10*3/uL (ref 0.0–0.5)
Eosinophils Relative: 0 %
HCT: 38.2 % — ABNORMAL LOW (ref 39.0–52.0)
Hemoglobin: 11.9 g/dL — ABNORMAL LOW (ref 13.0–17.0)
Immature Granulocytes: 0 %
Lymphocytes Relative: 6 %
Lymphs Abs: 0.3 10*3/uL — ABNORMAL LOW (ref 0.7–4.0)
MCH: 28.5 pg (ref 26.0–34.0)
MCHC: 31.2 g/dL (ref 30.0–36.0)
MCV: 91.4 fL (ref 80.0–100.0)
Monocytes Absolute: 0.1 10*3/uL (ref 0.1–1.0)
Monocytes Relative: 4 %
Neutro Abs: 3.5 10*3/uL (ref 1.7–7.7)
Neutrophils Relative %: 90 %
Platelets: 235 10*3/uL (ref 150–400)
RBC: 4.18 MIL/uL — ABNORMAL LOW (ref 4.22–5.81)
RDW: 15.9 % — ABNORMAL HIGH (ref 11.5–15.5)
WBC: 4 10*3/uL (ref 4.0–10.5)
nRBC: 0 % (ref 0.0–0.2)

## 2020-02-09 LAB — COMPREHENSIVE METABOLIC PANEL
ALT: 21 U/L (ref 0–44)
AST: 35 U/L (ref 15–41)
Albumin: 3.1 g/dL — ABNORMAL LOW (ref 3.5–5.0)
Alkaline Phosphatase: 129 U/L — ABNORMAL HIGH (ref 38–126)
Anion gap: 9 (ref 5–15)
BUN: 42 mg/dL — ABNORMAL HIGH (ref 8–23)
CO2: 18 mmol/L — ABNORMAL LOW (ref 22–32)
Calcium: 9.6 mg/dL (ref 8.9–10.3)
Chloride: 110 mmol/L (ref 98–111)
Creatinine, Ser: 1.38 mg/dL — ABNORMAL HIGH (ref 0.61–1.24)
GFR calc Af Amer: 58 mL/min — ABNORMAL LOW (ref >60)
GFR calc non Af Amer: 50 mL/min — ABNORMAL LOW (ref >60)
Glucose, Bld: 171 mg/dL — ABNORMAL HIGH (ref 70–99)
Potassium: 5.2 mmol/L — ABNORMAL HIGH (ref 3.5–5.1)
Sodium: 137 mmol/L (ref 135–145)
Total Bilirubin: 0.5 mg/dL (ref 0.3–1.2)
Total Protein: 7.7 g/dL (ref 6.5–8.1)

## 2020-02-09 LAB — GLUCOSE, CAPILLARY
Glucose-Capillary: 159 mg/dL — ABNORMAL HIGH (ref 70–99)
Glucose-Capillary: 154 mg/dL — ABNORMAL HIGH (ref 70–99)
Glucose-Capillary: 130 mg/dL — ABNORMAL HIGH (ref 70–99)
Glucose-Capillary: 144 mg/dL — ABNORMAL HIGH (ref 70–99)

## 2020-02-09 LAB — HEPARIN LEVEL (UNFRACTIONATED)
Heparin Unfractionated: 0.48 IU/mL (ref 0.30–0.70)
Heparin Unfractionated: 0.81 IU/mL — ABNORMAL HIGH (ref 0.30–0.70)

## 2020-02-09 LAB — PROCALCITONIN: Procalcitonin: <0.1 ng/mL

## 2020-02-09 LAB — D-DIMER, QUANTITATIVE: D-Dimer, Quant: 1.74 ug/mL-FEU — ABNORMAL HIGH (ref 0.00–0.50)

## 2020-02-09 LAB — TYPE AND SCREEN
ABO/RH(D): O POS
Antibody Screen: NEGATIVE

## 2020-02-09 LAB — ABO/RH: ABO/RH(D): O POS

## 2020-02-09 LAB — C-REACTIVE PROTEIN: CRP: 5.7 mg/dL — ABNORMAL HIGH (ref <1.0)

## 2020-02-09 MED ORDER — IPRATROPIUM-ALBUTEROL 20-100 MCG/ACT IN AERS
1.0000 | INHALATION_SPRAY | Freq: Four times a day (QID) | RESPIRATORY_TRACT | Status: DC
  Administered 2020-02-09 – 2020-02-21 (×44): 1 via RESPIRATORY_TRACT
  Filled 2020-02-09: qty 4

## 2020-02-09 MED ORDER — SENNOSIDES-DOCUSATE SODIUM 8.6-50 MG PO TABS: 2.0000 | ORAL_TABLET | Freq: Every evening | ORAL | Status: DC | PRN

## 2020-02-09 MED ORDER — SODIUM CHLORIDE 0.9 % IV SOLN
1.0000 g | Freq: Every day | INTRAVENOUS | Status: DC
  Administered 2020-02-09 – 2020-02-10 (×2): 1 g via INTRAVENOUS
  Filled 2020-02-09 (×2): qty 10

## 2020-02-09 MED ORDER — POLYETHYLENE GLYCOL 3350 17 G PO PACK: 17.0000 g | PACK | Freq: Every day | ORAL | Status: DC | PRN

## 2020-02-09 MED ORDER — HEPARIN (PORCINE) 25000 UT/250ML-% IV SOLN: 1100.0000 [IU]/h | INTRAVENOUS | Status: DC

## 2020-02-09 NOTE — Progress Notes (Signed)
[place] for Heparin Indication: DVT  Allergies  Allergen Reactions  . Ace Inhibitors Cough  . Cephalexin Hives, Diarrhea and Nausea Only    Tolerates Ancef, Rocephin, Cefoxitin  . Crestor [Rosuvastatin Calcium]   . Lipitor [Atorvastatin Calcium] Other (See Comments)    MUSCLE PAINS   . Plavix [Clopidogrel Bisulfate] Hives  . Vancomycin Nausea And Vomiting    Po route caused n/v.     Patient Measurements: Height: 5\' 8"  (172.7 cm) Weight: (!) 409 lb 6.3 oz (185.7 kg) IBW/kg (Calculated) : 68.4 Heparin Dosing Weight: 115.5 kg  Vital Signs: Temp: 98.2 F (36.8 C) (02/09 1600) Temp Source: Oral (02/09 1600) BP: 133/84 (02/09 1600) Pulse Rate: 102 (02/09 1600)  Labs: Recent Labs    02/08/20 1545 02/08/20 1815 02/08/20 1940 02/09/20 0432 02/09/20 1542  HGB 12.7*  --   --  11.9*  --   HCT 40.4  --   --  38.2*  --   PLT 250  --   --  235  --   APTT  --   --  32  --   --   LABPROT  --   --  13.8  --   --   INR  --   --  1.1  --   --   HEPARINUNFRC  --   --   --  0.48 0.81*  CREATININE 1.35*  --   --  1.38*  --   TROPONINIHS 6 6  --   --   --     Estimated Creatinine Clearance: 77.7 mL/min (A) (by C-G formula based on SCr of 1.38 mg/dL (H)).   Medical History: Past Medical History:  Diagnosis Date  . Astigmatism of both eyes 11/01/2016  . Bronchitis   . Coronary artery disease   . Cortical age-related cataract of both eyes 11/01/2016  . Diabetes mellitus without complication ([place])    type 2  . Drug-induced osteoporosis   . Dyslipidemia   . Fatigue   . Fatty liver   . History of kidney stones   . Hypercholesteremia   . Hypogonadism male   . 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 Normal nuclear stress test March 2011  . Nuclear sclerotic cataract of both eyes 11/01/2016  . Obesity   . Prostate cancer [facility])    s/p seed implant and  radiation  . Sleep apnea    90% of time dosent wear his cpap per pt  . Vitamin D insufficiency     Medications:  PTA Lovenox 40mg  sq q24h - LD 02/08/20 @ 11:00  Assessment:  74 yr male presents with hypoxia, SOB, weakness.  PMH significant for prostate cancer, HTN, HLD, DM, bladder cancer  Venous doppler = + LLE DVT  Head CT negative for bleed  CTAngio negative for pulmonary embolism  Pharmacy consulted to dose IV heparin for DVT   - Initial heparin level therapeutic 0.48 units/ml on 1400 units/ml - 2nd heparin level has increased to supratherapeutic at 0.81 on same rate of 1400 units/hr.  -2nd shift f/u:  HL 0.81 on hep 1400 ut/hr,  +PE/DVT  - No bleeding noted  Goal of Therapy:  Heparin level 0.3-0.7 units/ml Monitor platelets by anticoagulation protocol: Yes   Plan:   Decrease IV heparin rate to 1250 units/hr  Check heparin level in ~8 hours  Check daily heparin level  and CBC  Thanks for allowing pharmacy to be a part of this patient's care.  Nicole Cella, RPh Clinical Pharmacist 02/09/2020,4:54 PM

## 2020-02-09 NOTE — Progress Notes (Signed)
[place] for Heparin Indication: DVT  Allergies  Allergen Reactions  . Ace Inhibitors Cough  . Cephalexin Hives, Diarrhea and Nausea Only    Tolerates Ancef, Rocephin, Cefoxitin  . Crestor [Rosuvastatin Calcium]   . Lipitor [Atorvastatin Calcium] Other (See Comments)    MUSCLE PAINS   . Plavix [Clopidogrel Bisulfate] Hives  . Vancomycin Nausea And Vomiting    Po route caused n/v.     Patient Measurements: Height: 5\' 8"  (172.7 cm) Weight: (!) 409 lb 6.3 oz (185.7 kg) IBW/kg (Calculated) : 68.4 Heparin Dosing Weight: 85 kg  Vital Signs: Temp: 97.9 F (36.6 C) (02/09 0400) Temp Source: Oral (02/09 0400) BP: 120/72 (02/09 0400) Pulse Rate: 101 (02/09 0400)  Labs: Recent Labs    02/08/20 1545 02/08/20 1815 02/08/20 1940 02/09/20 0432  HGB 12.7*  --   --  11.9*  HCT 40.4  --   --  38.2*  PLT 250  --   --  235  APTT  --   --  32  --   LABPROT  --   --  13.8  --   INR  --   --  1.1  --   HEPARINUNFRC  --   --   --  0.48  CREATININE 1.35*  --   --   --   TROPONINIHS 6 6  --   --     Estimated Creatinine Clearance: 79.5 mL/min (A) (by C-G formula based on SCr of 1.35 mg/dL (H)).   Medical History: Past Medical History:  Diagnosis Date  . Astigmatism of both eyes 11/01/2016  . Bronchitis   . Coronary artery disease   . Cortical age-related cataract of both eyes 11/01/2016  . Diabetes mellitus without complication ([place])    type 2  . Drug-induced osteoporosis   . Dyslipidemia   . Fatigue   . Fatty liver   . History of kidney stones   . Hypercholesteremia   . Hypogonadism male   . 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 Normal nuclear stress test March 2011  . Nuclear sclerotic cataract of both eyes 11/01/2016  . Obesity   . Prostate cancer [facility])    s/p seed implant and radiation  . Sleep apnea    90% of time dosent wear his cpap  per pt  . Vitamin D insufficiency     Medications:  PTA Lovenox 40mg  sq q24h - LD 02/08/20 @ 11:00  Assessment:  74 yr male presents with hypoxia, SOB, weakness.  PMH significant for prostate cancer, HTN, HLD, DM, bladder cancer  Patient on Lovenox 40mg  sq q24h PTA for VTE prophylaxis  Venous doppler = + LLE DVT  Head CT negative for bleed  CTAngio negative for pulmonary embolism  Pharmacy consulted to dose IV heparin for DVT  Initial heparin level therapeutic 0.48 units/ml  Goal of Therapy:  Heparin level 0.3-0.7 units/ml Monitor platelets by anticoagulation protocol: Yes   Plan:   Continue IV heparin @ 1400 units/hr  Check heparin level later today to confirm  Check daily heparin level @ CBC  Thanks for allowing pharmacy to be a part of this patient's care.  Excell Seltzer, PharmD Clinical Pharmacist  02/09/2020,6:29 AM

## 2020-02-09 NOTE — Progress Notes (Addendum)
PROGRESS NOTE    NIKESH BUSBY  [id number] DOB: Mar 05, 1946 DOA: 02/08/2020 PCP: [name], MD   Brief Narrative:  74 year old with history of CKD stage III, HTN, bladder cancer, CAD sent from home after home health nurse found him confused with increasing oxygen requirement.  Also reported of left lower extremity pain.  In the ER noted to be COVID-19 positive, requiring nonrebreather due to hypoxia.  Diagnosed with left lower extremity DVT but CTA negative for PE shows bilateral opacity.  Inflammatory markers mildly elevated.  CT head was negative.  Started on Rocephin for UTI, procalcitonin negative.   Assessment & Plan:   Principal Problem:   Pneumonia due to COVID-19 virus Active Problems:   Hypertension associated with type 2 diabetes mellitus ([place])   Malignant neoplasm of urinary bladder (HCC)   Acute respiratory failure due to COVID-19 [facility])  Acute hypoxic respiratory failure secondary to COVID-19 pneumonia -Oxygen levels-8 L high flow -Remdesivir-day 2 -Decadrone-day 2 -Actemra-due to cancer history, will hold off on this -Convalescent plasma-none -Antibiotics-Rocephin for UTI -procalcitonin-<0.1 -BNP-normal -Chest x-ray/CTA -bilateral opacity, no PE seen -Supportive care-antitussive, inhalers, I-S/flutter -CODE STATUS confirmed -Vitamin C & Zinc. Prone >16hrs/day.  -Routine: Labs have been reviewed including ferritin, LDH, CRP, d-dimer, fibrinogen.  Will need to trend this lab daily.  Left lower extremity DVT with leg pain -Currently on heparin drip  Acute metabolic encephalopathy -CT head negative.  High-grade urothelial carcinoma of the bladder without metastases.  Diagnosed June 2020 Mild urinary tract infection -Follows with Dr. Alen Blew.  Has underwent chemotherapy and radical cystectomy with lymph node resection in December.  Now under active surveillance. -Urine culture sent.  Empiric Rocephin day 1/5  DVT prophylaxis: Heparin drip Code Status:  Full code Family Communication: Wife updated by me today Disposition Plan:   Patient From= home  Patient Anticipated D/C place= Home  Barriers= requiring significant amount of high flow nasal cannula oxygen.  Dyspneic on minimal exertion.  Unsafe for discharge home at the moment.  Consultants:   None  Procedures:   None  Antimicrobials:   Rocephin day 1   Subjective: Feels better today compared to yesterday. Still has exertional shortness of breath. Needing 10L HF [place]. I was able to wean it down to 8L HF while in the room.   Review of Systems Otherwise negative except as per HPI, including:. General = no fevers, chills, dizziness,  fatigue HEENT/EYES = negative for loss of vision, double vision, blurred vision,  sore throa Cardiovascular= negative for chest pain, palpitation Respiratory/lungs= negative for  wheezing; hemoptysis,  Gastrointestinal= negative for nausea, vomiting, abdominal pain Genitourinary= negative for Dysuria MSK = Negative for arthralgia, myalgias Neurology= Negative for headache, numbness, tingling  Psychiatry= Negative for suicidal and homocidal ideation Skin= Negative for Rash  Examination:  Constitutional: on 6L HF [place] Respiratory: Diffuse Rhonchi.  Cardiovascular: Normal sinus rhythm, no rubs Abdomen: Nontender nondistended good bowel sounds Musculoskeletal: No edema noted Skin: No rashes seen Neurologic: CN 2-12 grossly intact.  And nonfocal Psychiatric: Normal judgment and insight. Alert and oriented x 3. Normal mood.   Objective: Vitals:   02/08/20 2217 02/08/20 2230 02/09/20 0000 02/09/20 0400  BP: 104/79 101/62 126/78 120/72  Pulse: (!) 103 98 (!) 106 (!) 101  Resp: (!) 21 (!) 22 19 20   Temp:  98.4 F (36.9 C) 98.3 F (36.8 C) 97.9 F (36.6 C)  TempSrc:  Oral Oral Oral  SpO2: 94% 95% 92% 95%  Weight:  (!) 185.7 kg  Height:  5\' 8"  (1.727 m)      Intake/Output Summary (Last 24 hours) at 02/09/2020 0731 Last data filed at  02/09/2020 0500 Gross per 24 hour  Intake 107.15 ml  Output 600 ml  Net -492.85 ml   Filed Weights   02/08/20 1450 02/08/20 2230  Weight: 94.5 kg (!) 185.7 kg     Data Reviewed:   CBC: Recent Labs  Lab 02/08/20 1545 02/09/20 0432  WBC 5.3 4.0  NEUTROABS  --  3.5  HGB 12.7* 11.9*  HCT 40.4 38.2*  MCV 91.6 91.4  PLT 250 [id number]   Basic Metabolic Panel: Recent Labs  Lab 02/08/20 1545 02/09/20 0432  NA 137 137  K 4.7 5.2*  CL 109 110  CO2 18* 18*  GLUCOSE 122* 171*  BUN 47* 42*  CREATININE 1.35* 1.38*  CALCIUM 9.4 9.6   GFR: Estimated Creatinine Clearance: 77.7 mL/min (A) (by C-G formula based on SCr of 1.38 mg/dL (H)). Liver Function Tests: Recent Labs  Lab 02/08/20 1545 02/09/20 0432  AST 41 35  ALT 22 21  ALKPHOS 134* 129*  BILITOT 0.7 0.5  PROT 7.6 7.7  ALBUMIN 3.2* 3.1*   No results for input(s): LIPASE, AMYLASE in the last 168 hours. Recent Labs  Lab 02/08/20 1545  AMMONIA 54*   Coagulation Profile: Recent Labs  Lab 02/08/20 1940  INR 1.1   Cardiac Enzymes: No results for input(s): CKTOTAL, CKMB, CKMBINDEX, TROPONINI in the last 168 hours. BNP (last 3 results) No results for input(s): PROBNP in the last 8760 hours. HbA1C: No results for input(s): HGBA1C in the last 72 hours. CBG: Recent Labs  Lab 02/08/20 1548 02/08/20 2248  GLUCAP 110* 105*   Lipid Profile: No results for input(s): CHOL, HDL, LDLCALC, TRIG, CHOLHDL, LDLDIRECT in the last 72 hours. Thyroid Function Tests: No results for input(s): TSH, T4TOTAL, FREET4, T3FREE, THYROIDAB in the last 72 hours. Anemia Panel: Recent Labs    02/08/20 2052  FERRITIN 47   Sepsis Labs: No results for input(s): PROCALCITON, LATICACIDVEN in the last 168 hours.  Recent Results (from the past 240 hour(s))  Respiratory Panel by RT PCR (Flu A&B, Covid) - Nasopharyngeal Swab     Status: Abnormal   Collection Time: 02/08/20  3:14 PM   Specimen: Nasopharyngeal Swab  Result Value Ref Range  Status   SARS Coronavirus 2 by RT PCR POSITIVE (A) NEGATIVE Final    Comment: RESULT CALLED TO, READ BACK BY AND VERIFIED WITH: HODGES,I. RN @2010  ON 02.08.2021 BY COHEN,K (NOTE) SARS-CoV-2 target nucleic acids are DETECTED. SARS-CoV-2 RNA is generally detectable in upper respiratory specimens  during the acute phase of infection. Positive results are indicative of the presence of the identified virus, but do not rule out bacterial infection or co-infection with other pathogens not detected by the test. Clinical correlation with patient history and other diagnostic information is necessary to determine patient infection status. The expected result is Negative. Fact Sheet for Patients:  PinkCheek.be Fact Sheet for Healthcare Providers: GravelBags.it This test is not yet approved or cleared by the [place] FDA and  has been authorized for detection and/or diagnosis of SARS-CoV-2 by FDA under an Emergency Use Authorization (EUA).  This EUA will remain in effect (meaning this test can be  used) for the duration of  the COVID-19 declaration under Section 564(b)(1) of the Act, 21 U.S.C. section 360bbb-3(b)(1), unless the authorization is terminated or revoked sooner.    Influenza A by PCR NEGATIVE NEGATIVE Final  Influenza B by PCR NEGATIVE NEGATIVE Final    Comment: (NOTE) The Xpert Xpress SARS-CoV-2/FLU/RSV assay is intended as an aid in  the diagnosis of influenza from Nasopharyngeal swab specimens and  should not be used as a sole basis for treatment. Nasal washings and  aspirates are unacceptable for Xpert Xpress SARS-CoV-2/FLU/RSV  testing. Fact Sheet for Patients: PinkCheek.be Fact Sheet for Healthcare Providers: GravelBags.it This test is not yet approved or cleared by the [place] FDA and  has been authorized for detection and/or diagnosis of SARS-CoV-2  by  FDA under an Emergency Use Authorization (EUA). This EUA will remain  in effect (meaning this test can be used) for the duration of the  Covid-19 declaration under Section 564(b)(1) of the Act, 21  U.S.C. section 360bbb-3(b)(1), unless the authorization is  terminated or revoked. Performed at [facility], [place] [address] [place], [place] [address]     Radiology Studies: CT Head Wo Contrast  Result Date: 02/08/2020 CLINICAL DATA:  Altered mental status EXAM: CT HEAD WITHOUT CONTRAST TECHNIQUE: Contiguous axial images were obtained from the base of the skull through the vertex without intravenous contrast. COMPARISON:  MRI 06/04/2017 FINDINGS: Brain: There is atrophy and chronic small vessel disease changes. No acute intracranial abnormality. Specifically, no hemorrhage, hydrocephalus, mass lesion, acute infarction, or significant intracranial injury. Vascular: No hyperdense vessel or unexpected calcification. Skull: No acute calvarial abnormality. Sinuses/Orbits: Visualized paranasal sinuses and mastoids clear. Orbital soft tissues unremarkable. Other: None IMPRESSION: Atrophy, chronic microvascular disease. No acute intracranial abnormality. Electronically Signed   By: Rolm Baptise M.D.   On: 02/08/2020 19:08   CT Angio Chest PE W and/or Wo Contrast  Result Date: 02/08/2020 CLINICAL DATA:  High probability pulmonary embolus. History of cancer. New shortness of breath with hypoxia and tachycardia. Recent RIGHT calf pain. Two weeks of confusion and weakness. EXAM: CT ANGIOGRAPHY CHEST WITH CONTRAST TECHNIQUE: Multidetector CT imaging of the chest was performed using the standard protocol during bolus administration of intravenous contrast. Multiplanar CT image reconstructions and MIPs were obtained to evaluate the vascular anatomy. CONTRAST:  37mL OMNIPAQUE IOHEXOL 350 MG/ML SOLN COMPARISON:  11/02/2019 FINDINGS: Cardiovascular: Pulmonary arteries are well opacified by contrast  bolus. There is no acute pulmonary embolus. Heart size is within normal limits. There is atherosclerotic calcification of the thoracic aorta, not associated with aneurysm. No pericardial effusion. Mediastinum/Nodes: Esophagus is unremarkable. No significant mediastinal, hilar, or axillary adenopathy. Lungs/Pleura: Again noted are scattered areas of paraseptal emphysema, vertically within the lung apices and are subpleural reticulation. Superimposed on the chronic changes are patchy ground-glass opacities with a peripheral predominance, favoring infectious process over pulmonary edema. No pleural effusions. No frank consolidations. No suspicious pulmonary nodules. Upper Abdomen: No acute abnormality. Musculoskeletal: Midthoracic degenerative changes. Review of the MIP images confirms the above findings. IMPRESSION: 1. Technically adequate exam showing no acute pulmonary embolus. 2. Chronic changes of paraseptal emphysema and subpleural reticulation. 3. Patchy ground-glass opacities with a peripheral predominance, favoring infectious process over pulmonary edema. 4. Aortic Atherosclerosis (ICD10-I70.0). 5. Emphysema (ICD10-J43.9). Electronically Signed   By: Nolon Nations M.D.   On: 02/08/2020 19:14   DG Chest Portable 1 View  Result Date: 02/08/2020 CLINICAL DATA:  Short of breath, fatigue, altered level of consciousness EXAM: PORTABLE CHEST 1 VIEW COMPARISON:  12/23/2019 FINDINGS: Single frontal view of the chest demonstrates a stable cardiac silhouette. Diffuse interstitial scarring is again seen throughout the lungs without focal consolidation, effusion, or pneumothorax. There are no acute bony abnormalities. IMPRESSION:  1. Chronic parenchymal scarring and fibrosis. No acute airspace disease. Electronically Signed   By: Randa Ngo M.D.   On: 02/08/2020 16:08   VAS [place] LOWER EXTREMITY VENOUS (DVT) (ONLY [facility] & [facility])  Result Date: 02/08/2020  Lower Venous DVTStudy Indications: Pain.  Limitations: Body  habitus and poor ultrasound/tissue interface. Comparison Study: No prior studies. Performing Technologist: Oliver Hum RVT  Examination Guidelines: A complete evaluation includes B-mode imaging, spectral Doppler, color Doppler, and power Doppler as needed of all accessible portions of each vessel. Bilateral testing is considered an integral part of a complete examination. Limited examinations for reoccurring indications may be performed as noted. The reflux portion of the exam is performed with the patient in reverse Trendelenburg.  +---------+---------------+---------+-----------+----------+--------------+ RIGHT    CompressibilityPhasicitySpontaneityPropertiesThrombus Aging +---------+---------------+---------+-----------+----------+--------------+ CFV      Full           Yes      Yes                                 +---------+---------------+---------+-----------+----------+--------------+ SFJ      Full                                                        +---------+---------------+---------+-----------+----------+--------------+ FV Prox  Full                                                        +---------+---------------+---------+-----------+----------+--------------+ FV Mid   Full                                                        +---------+---------------+---------+-----------+----------+--------------+ FV DistalFull                                                        +---------+---------------+---------+-----------+----------+--------------+ PFV      Full                                                        +---------+---------------+---------+-----------+----------+--------------+ POP      Full           Yes      Yes                                 +---------+---------------+---------+-----------+----------+--------------+ PTV      Full                                                         +---------+---------------+---------+-----------+----------+--------------+  PERO     Full                                                        +---------+---------------+---------+-----------+----------+--------------+   +---------+---------------+---------+-----------+----------+--------------+ LEFT     CompressibilityPhasicitySpontaneityPropertiesThrombus Aging +---------+---------------+---------+-----------+----------+--------------+ CFV      Full           Yes      Yes                                 +---------+---------------+---------+-----------+----------+--------------+ SFJ      Full                                                        +---------+---------------+---------+-----------+----------+--------------+ FV Prox  Full                                                        +---------+---------------+---------+-----------+----------+--------------+ FV Mid   Full                                                        +---------+---------------+---------+-----------+----------+--------------+ FV DistalFull                                                        +---------+---------------+---------+-----------+----------+--------------+ PFV      Full                                                        +---------+---------------+---------+-----------+----------+--------------+ POP      Partial        No       No                   Acute          +---------+---------------+---------+-----------+----------+--------------+ PTV      Full                                                        +---------+---------------+---------+-----------+----------+--------------+ PERO                                                  Not  visualized +---------+---------------+---------+-----------+----------+--------------+     Summary: RIGHT: - There is no evidence of deep vein thrombosis in the lower extremity. However, portions of this  examination were limited- see technologist comments above.  - No cystic structure found in the popliteal fossa.  LEFT: - Findings consistent with acute deep vein thrombosis involving the left popliteal vein. - No cystic structure found in the popliteal fossa.  *See table(s) above for measurements and observations.    Preliminary         Scheduled Meds: . vitamin C  500 mg Oral Daily  . dexamethasone (DECADRON) injection  6 mg Intravenous Q24H  . insulin aspart  0-5 Units Subcutaneous QHS  . insulin aspart  0-9 Units Subcutaneous TID WC  . sodium chloride flush  3 mL Intravenous Once  . sodium chloride flush  3 mL Intravenous Q12H  . zinc sulfate  220 mg Oral Daily   Continuous Infusions: . sodium chloride    . heparin Stopped (02/09/20 0500)  . remdesivir 100 mg in NS 100 mL       LOS: 1 day   Time spent= 35 mins    Arty Lantzy Arsenio Loader, MD [facility]  If 7PM-7AM, please contact night-coverage  02/09/2020, 7:31 AM

## 2020-02-09 NOTE — Progress Notes (Signed)
   02/09/20 1057  Family/Significant Other Communication  Family/Significant Other Update Called;Updated   Spoke to Wife- [name]

## 2020-02-10 ENCOUNTER — Inpatient Hospital Stay (HOSPITAL_COMMUNITY): Payer: Medicare PPO

## 2020-02-10 DIAGNOSIS — I10 Essential (primary) hypertension: Secondary | ICD-10-CM

## 2020-02-10 DIAGNOSIS — J9600 Acute respiratory failure, unspecified whether with hypoxia or hypercapnia: Secondary | ICD-10-CM

## 2020-02-10 DIAGNOSIS — C679 Malignant neoplasm of bladder, unspecified: Secondary | ICD-10-CM

## 2020-02-10 DIAGNOSIS — E1159 Type 2 diabetes mellitus with other circulatory complications: Secondary | ICD-10-CM

## 2020-02-10 LAB — C-REACTIVE PROTEIN: CRP: 4.1 mg/dL — ABNORMAL HIGH (ref <1.0)

## 2020-02-10 LAB — GLUCOSE, CAPILLARY
Glucose-Capillary: 169 mg/dL — ABNORMAL HIGH (ref 70–99)
Glucose-Capillary: 191 mg/dL — ABNORMAL HIGH (ref 70–99)
Glucose-Capillary: 159 mg/dL — ABNORMAL HIGH (ref 70–99)
Glucose-Capillary: 188 mg/dL — ABNORMAL HIGH (ref 70–99)

## 2020-02-10 LAB — HEMOGLOBIN A1C
Hgb A1c MFr Bld: 6 % — ABNORMAL HIGH (ref 4.8–5.6)
Mean Plasma Glucose: 126 mg/dL

## 2020-02-10 LAB — LACTATE DEHYDROGENASE: LDH: 124 U/L (ref 98–192)

## 2020-02-10 LAB — CBC
HCT: 36.8 % — ABNORMAL LOW (ref 39.0–52.0)
Hemoglobin: 11.6 g/dL — ABNORMAL LOW (ref 13.0–17.0)
MCH: 28.4 pg (ref 26.0–34.0)
MCHC: 31.5 g/dL (ref 30.0–36.0)
MCV: 90 fL (ref 80.0–100.0)
Platelets: 224 10*3/uL (ref 150–400)
RBC: 4.09 MIL/uL — ABNORMAL LOW (ref 4.22–5.81)
RDW: 15.7 % — ABNORMAL HIGH (ref 11.5–15.5)
WBC: 4.7 10*3/uL (ref 4.0–10.5)
nRBC: 0 % (ref 0.0–0.2)

## 2020-02-10 LAB — D-DIMER, QUANTITATIVE: D-Dimer, Quant: 1.74 ug/mL-FEU — ABNORMAL HIGH (ref 0.00–0.50)

## 2020-02-10 LAB — BASIC METABOLIC PANEL
Anion gap: 9 (ref 5–15)
BUN: 48 mg/dL — ABNORMAL HIGH (ref 8–23)
CO2: 20 mmol/L — ABNORMAL LOW (ref 22–32)
Calcium: 9.5 mg/dL (ref 8.9–10.3)
Chloride: 113 mmol/L — ABNORMAL HIGH (ref 98–111)
Creatinine, Ser: 1.36 mg/dL — ABNORMAL HIGH (ref 0.61–1.24)
GFR calc Af Amer: 59 mL/min — ABNORMAL LOW (ref >60)
GFR calc non Af Amer: 51 mL/min — ABNORMAL LOW (ref >60)
Glucose, Bld: 167 mg/dL — ABNORMAL HIGH (ref 70–99)
Potassium: 5 mmol/L (ref 3.5–5.1)
Sodium: 142 mmol/L (ref 135–145)

## 2020-02-10 LAB — HEPARIN LEVEL (UNFRACTIONATED)
Heparin Unfractionated: 0.81 IU/mL — ABNORMAL HIGH (ref 0.30–0.70)
Heparin Unfractionated: 0.69 IU/mL (ref 0.30–0.70)

## 2020-02-10 LAB — FERRITIN: Ferritin: 41 ng/mL (ref 24–336)

## 2020-02-10 LAB — MAGNESIUM: Magnesium: 2 mg/dL (ref 1.7–2.4)

## 2020-02-10 MED ORDER — NYSTATIN 100000 UNIT/ML MT SUSP
5.0000 mL | Freq: Four times a day (QID) | OROMUCOSAL | Status: DC
  Administered 2020-02-10 – 2020-02-21 (×41): 500000 [IU] via ORAL
  Filled 2020-02-10 (×49): qty 5

## 2020-02-10 MED ORDER — HYDROCOD POLST-CPM POLST ER 10-8 MG/5ML PO SUER
5.0000 mL | Freq: Two times a day (BID) | ORAL | Status: DC
  Administered 2020-02-10 – 2020-02-21 (×22): 5 mL via ORAL
  Filled 2020-02-10 (×22): qty 5

## 2020-02-10 MED ORDER — ALBUTEROL SULFATE HFA 108 (90 BASE) MCG/ACT IN AERS
2.0000 | INHALATION_SPRAY | RESPIRATORY_TRACT | Status: DC | PRN
  Administered 2020-02-11 – 2020-02-12 (×2): 2 via RESPIRATORY_TRACT
  Filled 2020-02-10: qty 6.7

## 2020-02-10 MED ORDER — FUROSEMIDE 10 MG/ML IJ SOLN
40.0000 mg | Freq: Once | INTRAMUSCULAR | Status: AC
  Administered 2020-02-10: 11:00:00 40 mg via INTRAVENOUS
  Filled 2020-02-10: qty 4

## 2020-02-10 MED ORDER — APIXABAN 5 MG PO TABS: 5.0000 mg | ORAL_TABLET | Freq: Two times a day (BID) | ORAL | Status: DC

## 2020-02-10 MED ORDER — APIXABAN 5 MG PO TABS
10.0000 mg | ORAL_TABLET | Freq: Two times a day (BID) | ORAL | Status: DC
  Administered 2020-02-10 – 2020-02-11 (×4): 10 mg via ORAL
  Filled 2020-02-10 (×4): qty 2

## 2020-02-10 NOTE — Progress Notes (Addendum)
PROGRESS NOTE    Jimmy [name]  [id number] DOB: [birth year] DOA: 02/08/2020 PCP: [name], MD    Brief Narrative:   Patient was admitted to the hospital with a working diagnosis of acute hypoxic respiratory failure due to SARS COVID-19 viral pneumonia.  74 year old male who presented with dyspnea.  He does have significant past medical history for chronic kidney disease, hypertension, bladder cancer and coronary artery disease.  Patient reported not feeling well for several days at home, having dyspnea and cough.  Home health nurse noticed him to be more confused having increased oxygen requirements.  On his initial physical examination in the emergency department he was in respiratory distress and placed on nonrebreather mask, with oxygen saturation 90%, his blood pressure was 129/79, heart rate 102, respiratory rate 28.  His lungs were clear to auscultation, no wheezing or rhonchi, heart S1-S2, present with, abdomen was soft and nontender, no lower extremity edema.  Urinalysis specific gravity 1.012, 0-5 red cells, 11-20 white cells, large leukocytes. Chest radiograph with left lower lobe interstitial infiltrate, increased bilateral markings.  CT chest negative for pulmonary embolism, bilateral apical groundglass opacities bilateral subpleural groundglass infiltrates.  Patient has received supplemental oxygen per high flow nasal cannula, medical therapy with remdesivir and dexamethasone.  For persistent hypoxic respiratory failure he received SARS COVID-19 convalescent plasma.  He continued to have high oxygen requirements.  Ultrasonography lower extremities was positive for deep vein thrombosis on the left.     Assessment & Plan:   Principal Problem:   Pneumonia due to COVID-19 virus Active Problems:   Hypertension associated with type 2 diabetes mellitus ([place])   Malignant neoplasm of urinary bladder (HCC)   Acute respiratory failure due to COVID-19 ([place])   1. Acute  hypoxic respiratory failure due to SARS   RR: 16  Pulse oxymetry: 95%  Fi02: 7 L/ mim per HFNC   COVID-19 Labs  Recent Labs    02/08/20 2052 02/09/20 0432 02/10/20 0000  DDIMER  --  1.74* 1.74*  FERRITIN 47  --  41  LDH  --   --  124  CRP 4.1* 5.7* 4.1*    Lab Results  Component Value Date   SARSCOV2NAA POSITIVE (A) 02/08/2020   [place] NEGATIVE 12/23/2019   [place] NOT DETECTED 12/07/2019   [place] NEGATIVE 06/18/2019    Follow up chest film personally reviewed with bilateral interstitial infiltrates. Persistent elevation in inflammatory markers and high oxygen requirements,   Will continue medical therapy with Remdesivir #3/5 (AST 31, ALT 21)  and systemic corticosteroids with dexamethasone. On antitussive agents, bronchodilators and airway clearing techniques with flutter valve and incentive spirometer.   Will do a trial of furosemide for non cardiogenic pulmonary edema.  Patient continue to be at high risk for worsening respiratory failure.   2. Acute left lower extremity DVT. Will transition to apixaban from heparin for anticoagulation.  3. Metabolic encephalopathy. Patient is more awake and alert, but continue to have slow response to verbal stimuli. Continue neuro checks per unit protocol.   4. 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 No clear about urinary symptoms, urine culture with no growth, will discontinue antibiotic therapy for now, ruled out UTI.   5. CKD stage 3a. Renal function stable with serum cr at 1,36 with K at 5,0 and serum bicarbonate at 20. Will do trial of diuresis and will follow on renal panel in am, follow with volume balance. Avoid hypotension and nephrotoxic medications.   6. T2DM (Hgb  A1c 6.0). Fasting glucose this am at 167. Continue glucose cover and monitoring with insulin sliding scale.   DVT prophylaxis: apixaban   Code Status:  full Family Communication: no family at the bedside  Disposition  Plan/ discharge barriers: patient continue critically ill. Transfer to medical ward, dc telemetry.      Subjective: Patient with positive dyspnea at rest, slow to respond to questions, no nausea or vomiting, no dysuria.   Objective: Vitals:   02/09/20 1926 02/09/20 2342 02/10/20 0400 02/10/20 0734  BP:  123/76 121/74 133/84  Pulse: (!) 108 96 92 96  Resp: 17 19 20 16   Temp:  98.3 F (36.8 C) 98 F (36.7 C) 98 F (36.7 C)  TempSrc:  Oral Oral Oral  SpO2: 92% 95% 96% 95%  Weight:      Height:        Intake/Output Summary (Last 24 hours) at 02/10/2020 0831 Last data filed at 02/10/2020 0400 Gross per 24 hour  Intake 1428.44 ml  Output 1325 ml  Net 103.44 ml   Filed Weights   02/08/20 1450 02/08/20 2230  Weight: 94.5 kg (!) 185.7 kg    Examination:   General: Not in pain or dyspnea, deconditioned  Neurology: Awake and alert, non focal. Slow to respond to questions.  E ENT: mild pallor, no icterus, oral mucosa moist Cardiovascular: No JVD. S1-S2 present, rhythmic, no gallops, rubs, or murmurs. No lower extremity edema. Pulmonary: positive breath sounds bilaterally. Gastrointestinal. Abdomen with no organomegaly, non tender, no rebound or guarding Skin. No rashes Musculoskeletal: no joint deformities     Data Reviewed: I have personally reviewed following labs and imaging studies  CBC: Recent Labs  Lab 02/08/20 1545 02/09/20 0432 02/10/20 0000  WBC 5.3 4.0 4.7  NEUTROABS  --  3.5  --   HGB 12.7* 11.9* 11.6*  HCT 40.4 38.2* 36.8*  MCV 91.6 91.4 90.0  PLT 250 235 [id number]   Basic Metabolic Panel: Recent Labs  Lab 02/08/20 1545 02/09/20 0432 02/10/20 0000  NA 137 137 142  K 4.7 5.2* 5.0  CL 109 110 113*  CO2 18* 18* 20*  GLUCOSE 122* 171* 167*  BUN 47* 42* 48*  CREATININE 1.35* 1.38* 1.36*  CALCIUM 9.4 9.6 9.5  MG  --   --  2.0   GFR: Estimated Creatinine Clearance: 78.9 mL/min (A) (by C-G formula based on SCr of 1.36 mg/dL (H)). Liver Function  Tests: Recent Labs  Lab 02/08/20 1545 02/09/20 0432  AST 41 35  ALT 22 21  ALKPHOS 134* 129*  BILITOT 0.7 0.5  PROT 7.6 7.7  ALBUMIN 3.2* 3.1*   No results for input(s): LIPASE, AMYLASE in the last 168 hours. Recent Labs  Lab 02/08/20 1545  AMMONIA 54*   Coagulation Profile: Recent Labs  Lab 02/08/20 1940  INR 1.1   Cardiac Enzymes: No results for input(s): CKTOTAL, CKMB, CKMBINDEX, TROPONINI in the last 168 hours. BNP (last 3 results) No results for input(s): PROBNP in the last 8760 hours. HbA1C: Recent Labs    02/09/20 0432  HGBA1C 6.0*   CBG: Recent Labs  Lab 02/09/20 0743 02/09/20 1137 02/09/20 1625 02/09/20 2016 02/10/20 0732  GLUCAP 159* 154* 130* 144* 169*   Lipid Profile: No results for input(s): CHOL, HDL, LDLCALC, TRIG, CHOLHDL, LDLDIRECT in the last 72 hours. Thyroid Function Tests: No results for input(s): TSH, T4TOTAL, FREET4, T3FREE, THYROIDAB in the last 72 hours. Anemia Panel: Recent Labs    02/08/20 2052 02/10/20 0000  FERRITIN 47  49      Radiology Studies: I have reviewed all of the imaging during this hospital visit personally     Scheduled Meds: . vitamin C  500 mg Oral Daily  . dexamethasone (DECADRON) injection  6 mg Intravenous Q24H  . insulin aspart  0-5 Units Subcutaneous QHS  . insulin aspart  0-9 Units Subcutaneous TID WC  . Ipratropium-Albuterol  1 puff Inhalation Q6H  . sodium chloride flush  3 mL Intravenous Once  . sodium chloride flush  3 mL Intravenous Q12H  . zinc sulfate  220 mg Oral Daily   Continuous Infusions: . sodium chloride    . cefTRIAXone (ROCEPHIN)  IV 1 g (02/09/20 0846)  . heparin 1,100 Units/hr (02/10/20 0121)  . remdesivir 100 mg in NS 100 mL 100 mg (02/10/20 0820)     LOS: 2 days        Mahin Guardia Gerome Apley, MD

## 2020-02-10 NOTE — Progress Notes (Signed)
   02/10/20 0847  Family/Significant Other Communication  Family/Significant Other Update Called;Updated;Other (Comment) (Wife- [name])

## 2020-02-10 NOTE — Progress Notes (Signed)
[facility] [place] NOTE  Pharmacy Consult for Heparin>>Eliquis Indication: DVT   Assessment:  74 yr male presents with hypoxia, SOB, weakness.  PMH significant for prostate cancer, HTN, HLD, DM, bladder cancer  Venous doppler = + LLE DVT  Head CT negative for bleed  CTAngio negative for pulmonary embolism  Pharmacy consulted to transition from IV UFH to Eliquis  Plan:   Stop heparin drip and transition to Eliquis 10 mg bid x 7d followed by 5 mg bid thereafter  F/U renal function, CBC, s/s of bleeding  Will educate patient/family when appropriate  Thanks for allowing pharmacy to be a part of this patient's care.  Ulice Dash, PharmD, BCPS 02/10/2020,11:45 AM

## 2020-02-10 NOTE — Evaluation (Signed)
Physical Therapy Evaluation Patient Details Name: Jimmy [name] MRN: [id number] DOB: 1946-11-17 Today's Date: 02/10/2020   History of Present Illness  Additionally he has OSA but the majority of the time does not use his CPAP- CURRENT: Covid with PNA; CKD III, HTN, bladder CA, CAD. Has ileal conduit with urinary diversion- Multiple surgeries. He came into hospital directly from home when [facility] RN noted his lethargy - and he had been sick for a few days prior. RN and physician did clear for PT to eval and treat (he has LLE DVT), since he has been on Heparin drip for sufficient therapeutic time.   Clinical Impression  He was sleeping in bed when PT arrived. Did arouse to name. He initially presented as confused as he could not remember information about his home or who he lived with (Spouse). Orientation cleared a little during the PT session. He did not use oxygen at home. He is generally weak and a mouth breather. He requires assistance for supine to sit, sit<>stand and ambulation with RW- recommend assist of 2 for safety both physically and his lines. Should benefit from PT to address goals as outlined. At end of session, he was in [facility] chair with BS table in front of him- call light, remote, and telephone all in reach. He was instructed in simple LE ex as well as reviewed IS and Flutter valve unit.    Follow Up Recommendations Home health PT    Equipment Recommendations  Rolling walker with 5" wheels    Recommendations for Other Services       Precautions / Restrictions Precautions Precautions: Fall Precaution Comments: Monitor closely for O2 sats. He did recover quickly from 86%, and when RN increased the O2 to 10 (4 min recovery time) Restrictions Weight Bearing Restrictions: No      Mobility  Bed Mobility Overal bed mobility: Needs Assistance Bed Mobility: Rolling;Sidelying to Sit;Supine to Sit Rolling: Min assist Sidelying to sit: Mod assist Supine to sit: Mod assist         Transfers Overall transfer level: Needs assistance Equipment used: Rolling walker (2 wheeled) Transfers: Sit to/from Stand Sit to Stand: +2 physical assistance;Min assist;Mod assist;+2 safety/equipment         General transfer comment: Cues to move feet- once in standing had difficulty processing to take steps to the [facility] chair.  Ambulation/Gait Ambulation/Gait assistance: +2 safety/equipment;Min assist Gait Distance (Feet): 8 Feet Assistive device: Rolling walker (2 wheeled) Gait Pattern/deviations: Shuffle;Wide base of support   Gait velocity interpretation: 1.31 - 2.62 ft/sec, indicative of limited community Conservation officer, historic buildings Rankin (Stroke Patients Only)       Balance Overall balance assessment: Mild deficits observed, not formally tested                                           Pertinent Vitals/Pain Pain Assessment: No/denies pain    Home Living Family/patient expects to be discharged to:: Private residence Living Arrangements: Spouse/significant other;Children   Type of Home: House Home Access: Stairs to enter Entrance Stairs-Rails: Right;Left;Can reach both Technical brewer of Steps: 3 Home Layout: One level Home Equipment: Walker - 2 wheels;Grab bars - tub/shower      Prior Function Level of Independence: Needs assistance   Gait / Transfers Assistance Needed: prior  to recent hospitalizations was completely independent; since surgery in December 2020           Hand Dominance   Dominant Hand: Right    Extremity/Trunk Assessment        Lower Extremity Assessment Lower Extremity Assessment: Generalized weakness    Cervical / Trunk Assessment Cervical / Trunk Assessment: Normal  Communication   Communication: HOH  Cognition Arousal/Alertness: Awake/alert(States he was just "very tired")   Overall Cognitive Status: Within Functional Limits for tasks assessed                                  General Comments: He was cooperative but initially was having difficulty recalling details of PLOF and home environment- was more oriented at end of session.      General Comments General comments (skin integrity, edema, etc.): Monitor closely for skin and joint integrity. Both feet and ankles- edema - says "they swell at home"    Exercises General Exercises - Lower Extremity Ankle Circles/Pumps: AROM;Seated;Both(Will need review and cues) Quad Sets: AROM;Seated;Both Gluteal Sets: AROM;Seated;Both Short Arc Quad: AROM;Seated;Both Other Exercises Other Exercises: Instructed in Pursed lip breathing- he is a mouth breather Other Exercises: Practiced IS and able to achieve 1250  2 x, and 750 for 8 reps Other Exercises: Instructed in Flutter Valve unit with good return demo- x 10   Assessment/Plan    PT Assessment Patient needs continued PT services  PT Problem List Decreased strength;Decreased activity tolerance;Decreased balance;Decreased mobility       PT Treatment Interventions DME instruction;Gait training;Stair training;Functional mobility training;Therapeutic activities;Patient/family education;Therapeutic exercise    PT Goals (Current goals can be found in the Care Plan section)  Acute Rehab PT Goals Patient Stated Goal: Just want to get well- "why am I here"/ discussed his admission- "wants to go home" PT Goal Formulation: With patient Time For Goal Achievement: 02/10/20 Potential to Achieve Goals: Fair    Frequency Min 3X/week   Barriers to discharge        Co-evaluation               AM-PAC PT "6 Clicks" Mobility  Outcome Measure Help needed turning from your back to your side while in a flat bed without using bedrails?: A Little Help needed moving from lying on your back to sitting on the side of a flat bed without using bedrails?: A Lot Help needed moving to and from a bed to a chair (including a wheelchair)?: A  Little(assist of 2) Help needed standing up from a chair using your arms (e.g., wheelchair or bedside chair)?: A Little(x2) Help needed to walk in hospital room?: A Little(x2) Help needed climbing 3-5 steps with a railing? : A Lot 6 Click Score: 16    End of Session   Activity Tolerance: Patient limited by fatigue Patient left: in chair;with call bell/phone within reach;with bed alarm set   PT Visit Diagnosis: Unsteadiness on feet (R26.81);Other abnormalities of gait and mobility (R26.89);Muscle weakness (generalized) (M62.81)    Time: [id number] PT Time Calculation (min) (ACUTE ONLY): 58 min   Charges:   PT Evaluation $PT Eval High Complexity: 1 High PT Treatments $Gait Training: 8-22 mins $Therapeutic Exercise: 8-22 mins $Therapeutic Activity: 8-22 mins        Rollen Sox, PT # [phone] CGV cell  Casandra Doffing 02/10/2020, 10:31 AM

## 2020-02-10 NOTE — Progress Notes (Signed)
ANTICOAGULATION CONSULT NOTE  Pharmacy Consult for Heparin Indication: DVT   Assessment:  74 yr male presents with hypoxia, SOB, weakness.  PMH significant for prostate cancer, HTN, HLD, DM, bladder cancer  Venous doppler = + LLE DVT  Head CT negative for bleed  CTAngio negative for pulmonary embolism  Pharmacy consulted to dose IV heparin for DVT   Heparin level remains elevated despite rate decrease.  No bleeding noted Goal of Therapy:  Heparin level 0.3-0.7 units/ml Monitor platelets by anticoagulation protocol: Yes   Plan:   Decrease IV heparin rate to 1100 units/hr  Check heparin level in ~8 hours  Check daily heparin level and CBC  Thanks for allowing pharmacy to be a part of this patient's care.  Excell Seltzer, PharmD Clinical Pharmacist 02/10/2020,1:18 AM

## 2020-02-11 ENCOUNTER — Telehealth: Payer: Self-pay | Admitting: Family Medicine

## 2020-02-11 LAB — COMPREHENSIVE METABOLIC PANEL
ALT: 40 U/L (ref 0–44)
AST: 71 U/L — ABNORMAL HIGH (ref 15–41)
Albumin: 2.9 g/dL — ABNORMAL LOW (ref 3.5–5.0)
Alkaline Phosphatase: 135 U/L — ABNORMAL HIGH (ref 38–126)
Anion gap: 8 (ref 5–15)
BUN: 55 mg/dL — ABNORMAL HIGH (ref 8–23)
CO2: 20 mmol/L — ABNORMAL LOW (ref 22–32)
Calcium: 9.3 mg/dL (ref 8.9–10.3)
Chloride: 109 mmol/L (ref 98–111)
Creatinine, Ser: 1.54 mg/dL — ABNORMAL HIGH (ref 0.61–1.24)
GFR calc Af Amer: 51 mL/min — ABNORMAL LOW (ref >60)
GFR calc non Af Amer: 44 mL/min — ABNORMAL LOW (ref >60)
Glucose, Bld: 212 mg/dL — ABNORMAL HIGH (ref 70–99)
Potassium: 5.3 mmol/L — ABNORMAL HIGH (ref 3.5–5.1)
Sodium: 137 mmol/L (ref 135–145)
Total Bilirubin: 0.5 mg/dL (ref 0.3–1.2)
Total Protein: 7.3 g/dL (ref 6.5–8.1)

## 2020-02-11 LAB — GLUCOSE, CAPILLARY
Glucose-Capillary: 188 mg/dL — ABNORMAL HIGH (ref 70–99)
Glucose-Capillary: 189 mg/dL — ABNORMAL HIGH (ref 70–99)
Glucose-Capillary: 205 mg/dL — ABNORMAL HIGH (ref 70–99)
Glucose-Capillary: 198 mg/dL — ABNORMAL HIGH (ref 70–99)

## 2020-02-11 LAB — URINE CULTURE: Culture: >=100000 — AB

## 2020-02-11 LAB — D-DIMER, QUANTITATIVE: D-Dimer, Quant: 1.47 ug/mL-FEU — ABNORMAL HIGH (ref 0.00–0.50)

## 2020-02-11 LAB — FERRITIN: Ferritin: 46 ng/mL (ref 24–336)

## 2020-02-11 LAB — C-REACTIVE PROTEIN: CRP: 1.4 mg/dL — ABNORMAL HIGH (ref <1.0)

## 2020-02-11 MED ORDER — SODIUM ZIRCONIUM CYCLOSILICATE 10 G PO PACK
10.0000 g | PACK | Freq: Three times a day (TID) | ORAL | Status: DC
  Administered 2020-02-11 – 2020-02-12 (×6): 10 g via ORAL
  Filled 2020-02-11 (×9): qty 1

## 2020-02-11 MED ORDER — SODIUM ZIRCONIUM CYCLOSILICATE 5 G PO PACK: 5.0000 g | PACK | Freq: Three times a day (TID) | ORAL | Status: DC

## 2020-02-11 MED ORDER — THIAMINE HCL 100 MG PO TABS
100.0000 mg | ORAL_TABLET | Freq: Every day | ORAL | Status: DC
  Administered 2020-02-11 – 2020-02-21 (×11): 100 mg via ORAL
  Filled 2020-02-11 (×11): qty 1

## 2020-02-11 MED ORDER — ADULT MULTIVITAMIN W/MINERALS CH
1.0000 | ORAL_TABLET | Freq: Every day | ORAL | Status: DC
  Administered 2020-02-11 – 2020-02-21 (×11): 1 via ORAL
  Filled 2020-02-11 (×11): qty 1

## 2020-02-11 NOTE — Progress Notes (Signed)
Occupational Therapy Evaluation Patient Details Name: Jimmy [name] MRN: [id number] DOB: May 29, 1946 Today's Date: 02/11/2020    History of Present Illness 74 year old male admitted with COVID/PNA; PMH includes bladder cancer- ileus conduit with urinary diversion. Has OSA but generally does not use his CPAP. Has had multiple surgeries- including for bladder cancer- most recent Dec 2020   Clinical Impression   PTA, pt independent with mobility and required Min A with ADL since recent urostomy in December 2020. Pt with flat affect, dazed and not responding well to questions. BP in flowsheet and systomic sitting 138; unable to complete BP in standing as pt complained of not feeling well and sat down -systolic [id number]. Desat into mid 80s on 3L with HR 133 and increased RR. Pt perseverating on answers and had difficulty sustaining attention to tasks. Pt incontinent of BM and noted blood in stool - nsg made aware and MD notified of changes this pm. Pt tearful and stated he was scared. At this time recommend rehab at [facility]. If pt improves and has 24/7 assistance, he may be able to progress to DC home. Will follow acutely.     Follow Up Recommendations  Supervision/Assistance - 24 hour;SNF    Equipment Recommendations  3 in 1 bedside commode    Recommendations for Other Services       Precautions / Restrictions Precautions Precautions: Fall Precaution Comments: Monitor closely related to O2- He presents as confused- slow to respond and complete tasks today. Restrictions Weight Bearing Restrictions: No      Mobility Bed Mobility Overal bed mobility: Needs Assistance Bed Mobility: Sit to Supine       Sit to supine: Mod assist   General bed mobility comments: A to lift BLE back onto bed  Transfers Overall transfer level: Needs assistance Equipment used: Rolling walker (2 wheeled) Transfers: Sit to/from [facility] Sit to Stand: Min assist Stand pivot transfers: +2  safety/equipment;Min assist            Balance Overall balance assessment: Needs assistance   Sitting balance-Leahy Scale: Fair       Standing balance-Leahy Scale: Poor                             ADL either performed or assessed with clinical judgement   ADL Overall ADL's : Needs assistance/impaired     Grooming: Minimal assistance;Sitting   Upper Body Bathing: Moderate assistance;Sitting   Lower Body Bathing: Maximal assistance;Sit to/from stand   Upper Body Dressing : Minimal assistance;Sitting   Lower Body Dressing: Moderate assistance;Sit to/from stand   Toilet Transfer: Minimal assistance;Stand-pivot   Toileting- Clothing Manipulation and Hygiene: Total assistance Toileting - Clothing Manipulation Details (indicate cue type and reason): incontinenet of BM; urostomy     Functional mobility during ADLs: Minimal assistance;+2 for safety/equipment;Rolling walker;Cueing for safety       Vision         Perception     Praxis Praxis Praxis tested?: Deficits Deficits: Initiation    Pertinent Vitals/Pain Pain Assessment: Faces Faces Pain Scale: Hurts a little bit Pain Location: general discomfort Pain Descriptors / Indicators: Discomfort     Hand Dominance Right   Extremity/Trunk Assessment Upper Extremity Assessment Upper Extremity Assessment: Generalized weakness   Lower Extremity Assessment Lower Extremity Assessment: Defer to PT evaluation   Cervical / Trunk Assessment Cervical / Trunk Assessment: Normal;Other exceptions(urostomy)   Communication Communication Communication: HOH   Cognition Arousal/Alertness: Charity fundraiser  During Therapy: Flat affect;Anxious Overall Cognitive Status: Impaired/Different from baseline Area of Impairment: Orientation;Attention;Memory;Following commands;Safety/judgement;Awareness;Problem solving                 Orientation Level: Disoriented to;Place;Time;Situation Current Attention  Level: Sustained Memory: Decreased recall of precautions;Decreased short-term memory Following Commands: Follows one step commands inconsistently Safety/Judgement: Decreased awareness of safety;Decreased awareness of deficits Awareness: Emergent Problem Solving: Slow processing;Decreased initiation;Difficulty sequencing;Requires verbal cues;Requires tactile cues General Comments: perseverating   General Comments  Continue to monitor closely for skin and joint integrity. Both feet and ankles-> edema continues    Exercises General Exercises - Lower Extremity Ankle Circles/Pumps: AROM;Seated;Both Quad Sets: AROM;Seated;Both Short Arc Quad: AROM;Seated;Both Hip Flexion/Marching: AROM;Seated;Both Other Exercises Other Exercises: Instructed in Pursed lip breathing- he is a mouth breather--needed cues to properly use today Other Exercises: Practiced IS and only able to achieve 500 for 8 out of 10 reps Other Exercises: Good return demo for Flutter Valve unit   Shoulder Instructions      Home Living Family/patient expects to be discharged to:: Private residence Living Arrangements: Spouse/significant other;Children Available Help at Discharge: Family;Available 24 hours/day Type of Home: House Home Access: Stairs to enter [employer] of Steps: 3   Home Layout: One level     Bathroom Shower/Tub: Tub/shower unit;Walk-in shower   Bathroom Toilet: Handicapped height         Additional Comments: Pt unable to give information      Prior Functioning/Environment Level of Independence: Needs assistance  Gait / Transfers Assistance Needed: prior to recent hospitalizations was completely independent; since surgery in December 2020 ADL's / Homemaking Assistance Needed: Pt requiring min A since recent surgery; prior to that was independent Communication / Swallowing Assistance Needed: pt whipsering          OT Problem List: Decreased strength;Decreased activity  tolerance;Impaired balance (sitting and/or standing);Decreased coordination;Decreased cognition;Decreased safety awareness;Decreased knowledge of use of DME or AE;Cardiopulmonary status limiting activity;Pain      OT Treatment/Interventions: Self-care/ADL training;Therapeutic exercise;Neuromuscular education;Energy conservation;DME and/or AE instruction;Therapeutic activities;Cognitive remediation/compensation;Patient/family education;Balance training    OT Goals(Current goals can be found in the care plan section) Acute Rehab OT Goals Patient Stated Goal: pt unable to state OT Goal Formulation: Patient unable to participate in goal setting Time For Goal Achievement: 02/25/20 Potential to Achieve Goals: Good  OT Frequency: Min 3X/week   Barriers to D/C: Decreased caregiver support(wife is sick)          Co-evaluation              AM-PAC OT "6 Clicks" Daily Activity     Outcome Measure Help from another person eating meals?: A Little Help from another person taking care of personal grooming?: A Little Help from another person toileting, which includes using toliet, bedpan, or urinal?: Total Help from another person bathing (including washing, rinsing, drying)?: A Lot Help from another person to put on and taking off regular upper body clothing?: A Little Help from another person to put on and taking off regular lower body clothing?: A Lot 6 Click Score: 14   End of Session Equipment Utilized During Treatment: Gait belt;Rolling walker;Oxygen(3L) Nurse Communication: Mobility status;Other (comment)(decline in status)  Activity Tolerance: Patient limited by lethargy Patient left: in bed;with call bell/phone within reach;with bed alarm set  OT Visit Diagnosis: Unsteadiness on feet (R26.81);Other abnormalities of gait and mobility (R26.89);Muscle weakness (generalized) (M62.81);Other symptoms and signs involving cognitive function;Pain Pain - part of body: (generalized)  Time: [id number] OT Time Calculation (min): 47 min Charges:  OT General Charges $OT Visit: 1 Visit OT Evaluation $OT Eval Moderate Complexity: 1 Mod OT Treatments $Self Care/Home Management : 23-37 mins  [name], OT/L   Acute OT Clinical Specialist Acute Rehabilitation Services Pager [phone] Office [phone]   [facility] 02/11/2020, 4:21 PM

## 2020-02-11 NOTE — Progress Notes (Signed)
Received urostomy supplies from home.

## 2020-02-11 NOTE — Progress Notes (Addendum)
Delayed and wrong responses to questions while working with OT today. Sats 92% on 3lpm. BP 138/74 HR 124 while sitting. 120/74 HR 130 standing. Also noticed slight blood on pad mixed in with BM. BM is a smear. Dr Cathlean Sauer notified.

## 2020-02-11 NOTE — Consult Note (Signed)
[place] Nurse Consult Note:  Patient known to our department from his ileal conduit (urostomy) surgery in December 2020 at [facility].  Reason for Consult:Open wound at midline, proximal end Wound type:Surgical Pressure Injury POA: N/A Measurement:Per Nursing flow sheet Wound [id number], moist (see photo provided yesterday) Drainage (amount, consistency, odor) serous in small amount Periwound: Intact with evidence of wound healing Dressing procedure/placement/frequency: The wound is healing slowly by secondary intention and is free of any signs or symptoms of infection or other complications.  Initial POC was for NS twice daily dressings and I will provide guidance via the Orders for this to continue while in house.  [place] Nurse ostomy consult note Stoma type/location: RUQ ileal conduit (urostomy) Stomal assessment/size: Not assessed today. Last measurement on 12/28/19 was 1 inch, round Peristomal assessment: Not seen today Treatment options for stomal/peristomal skin: Not seen today Output: not seen today Ostomy pouching: 1pc.cut-to-fit convex urostomy pouch with skin barrier ring. Pouch is Kellie Simmering # [id number], skin barrier ring is Kellie Simmering # [phone] and adapter to attach pouch to bedside urinary drainage bag is Kellie Simmering # [phone]   Enrolled patient in [place] program: Yes, previously   Patient and wife were independent in ostomy pouch care and maintenance by December 18, 2019. Guidance for Nursing staff in the care of the ostomy is provided via the Orders.  [place] nursing team will not follow routinely, but will remain available to this patient, the nursing and medical teams while in house.  Please re-consult if needed. Thanks, Maudie Flakes, MSN, RN, [place], Arther Abbott  Pager# [phone]

## 2020-02-11 NOTE — Telephone Encounter (Signed)
Returned call to wife per Dr. Redmond School.  Reviewing chart, shows pt is disoriented, confused and extremely fatigued today. PT was able to work with him, OT was not able to work with pt due to pt fatigue.  Dr. Arriene's note shows LOS 3 days.  Wife was concerned pt would be released tomorrow from the call she received from the nurse.    Per wife she has just been covid tested due to sx.  Wife is very concerned she cannot handle him if he comes home too early and sickly.

## 2020-02-11 NOTE — Telephone Encounter (Signed)
[name] called and wants you to give her a call states she got a call from the hospital and is very confused and wants to talk to you about it, please call her at [phone]

## 2020-02-11 NOTE — Progress Notes (Signed)
OT Cancellation Note  Patient Details Name: Jimmy [name] MRN: [id number] DOB: [birth year]   Cancelled Treatment:    Reason Eval/Treat Not Completed: Fatigue/lethargy limiting ability to participate(Just finished working with PT. Will attempt this pm)  [name],[name] 02/11/2020, 12:13 PM  [name], OT/L   Acute OT Clinical Specialist Acute Rehabilitation Services Pager [phone] Office [phone]

## 2020-02-11 NOTE — Progress Notes (Addendum)
PROGRESS NOTE    Jimmy [name]  [id number] DOB: [birth year] DOA: 02/08/2020 PCP: [name], MD    Brief Narrative:  Patient was admitted to the hospital with a working diagnosis of acute hypoxic respiratory failure due to SARS COVID-19 viral pneumonia.  74 year old male who presented with dyspnea.  He does have significant past medical history for chronic kidney disease, hypertension, bladder cancer and coronary artery disease.  Patient reported not feeling well for several days at home, having dyspnea and cough.  Home health nurse noticed him to be more confused having increased oxygen requirements.  On his initial physical examination in the emergency department he was in respiratory distress and placed on nonrebreather mask, with oxygen saturation 90%, his blood pressure was 129/79, heart rate 102, respiratory rate 28.  His lungs were clear to auscultation, no wheezing or rhonchi, heart S1-S2, present with, abdomen was soft and nontender, no lower extremity edema.  Urinalysis specific gravity 1.012, 0-5 red cells, 11-20 white cells, large leukocytes. Chest radiograph with left lower lobe interstitial infiltrate, increased bilateral markings.  CT chest negative for pulmonary embolism, bilateral apical groundglass opacities bilateral subpleural groundglass infiltrates.  Patient has received supplemental oxygen per high flow nasal cannula, medical therapy with remdesivir and dexamethasone.  For persistent hypoxic respiratory failure he received SARS COVID-19 convalescent plasma.  He continued to have high oxygen requirements.  Ultrasonography lower extremities was positive for deep vein thrombosis on the left.  Patient responded well to diuresis for non cardiogenic pulmonary edema with improvement of oxygenation.   Assessment & Plan:   Principal Problem:   Pneumonia due to COVID-19 virus Active Problems:   Hypertension associated with type 2 diabetes mellitus ([place])   Malignant  neoplasm of urinary bladder ([facility])   Acute respiratory failure due to COVID-19 ([place])    1. Acute hypoxic respiratory failure due to SARS COVID 19 viral pneumonia.   RR: 18  Pulse oxymetry: 94  Fi02: 3 L/ min per [place]  COVID-19 Labs  Recent Labs    02/08/20 2052 02/08/20 2052 02/09/20 0432 02/10/20 0000 02/11/20 0347  DDIMER  --   --  1.74* 1.74* 1.47*  FERRITIN 47  --   --  41 46  LDH  --   --   --  124  --   CRP 4.1*   < > 5.7* 4.1* 1.4*   < > = values in this interval not displayed.    Lab Results  Component Value Date   [place] (A) 02/08/2020   [place] NEGATIVE 12/23/2019   [place] NOT DETECTED 12/07/2019   [place] NEGATIVE 06/18/2019    Patient responded well to diuresis with improvement of oxygen requirements.   Tolerating well medical therapy with Remdesivir #4/5 (AST 71, ALT 40), continue with systemic corticosteroids dexamethasone. Continue with antitussive agents, bronchodilators and airway clearing techniques with flutter valve and incentive spirometer.   Out of bed to chair, tid with meals, continue physical and occupation therapy.    2. Acute left lower extremity DVT. Continue anticoagulation with  apixaban   3. Metabolic encephalopathy. He continue to have slow response to verbal stimuli. No focal deficit. Head CT on admission with atrophy and chronic microvascular disease. Will continue neuro checks per unit protocol, will add thiamine, and multivitamins. Consult nutrition for possible supplements.   4. 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 No clear about urinary symptoms,   06/22/2019. SP TURB with bilateral ureteral stents placement/final pathology showed high-grade infiltrating papillary adenocarcinoma,  invading into the detrusor muscle with lymphovascular invasion.  .  08/07/2019 patient underwent neoadjuvant chemotherapy, 4 cycles completed October 16, 2019. 12/2019 underwent radical cystectomy and  lymph node dissection.    Final pathology showed 1.5 cm residual tumor with invasion into the perivascular soft tissue.  Final pathology stage T38 and 0 with 0 out of 10 lymph nodes involved.  Currently under active surveillance.  His culture was positive for coagulase negative staph 80, 000 CFS and aerococcus >100, 000 CFU, likely a contamination, will continue to observe off antibiotic therapy. UTI ruled out.    5. CKD stage 3a with hyperkalemia. Urine output over last 24 H 2,075 ml. Stable renal function with serum cr at 1,54, with K at 5,3 and serum bicarbonate at 20. Will add sodium zirconium TID, and follow on renal panel in am, hold on further diuresis.   6. T2DM (Hgb A1c 6.0). Fasting glucose this am at 2112. For now will continue glucose cover and monitoring with insulin sliding scale. Patient with poor oral intake.   DVT prophylaxis: apixaban   Code Status:  full Family Communication: I spoke over the phone with the patient's wife and daughter about patient's  condition, plan of care, prognosis and all questions were addressed. Disposition Plan/ discharge barriers: Possible dc in am, pending oxygenation and mentation.    Subjective: Today patient seems to be more confused and deconditioned, his dyspnea is reported to be better, and he has decreased his oxygen requirements. No nausea or vomiting.   Objective: Vitals:   02/11/20 0000 02/11/20 0400 02/11/20 0720 02/11/20 0725  BP: 118/73 121/77 124/75   Pulse: 96 95 (!) 104   Resp: 16 15 18    Temp: 98 F (36.7 C) 97.7 F (36.5 C) 98.1 F (36.7 C)   TempSrc: Oral Oral Oral   SpO2: 95% 93% 92% 94%  Weight:      Height:        Intake/Output Summary (Last 24 hours) at 02/11/2020 0813 Last data filed at 02/11/2020 0400 Gross per 24 hour  Intake 1210.57 ml  Output 2075 ml  Net -864.43 ml   Filed Weights   02/08/20 1450 02/08/20 2230  Weight: 94.5 kg (!) 185.7 kg    Examination:   General: deconditioned and ill  looking appearing, positive dyspnea at rest.  Neurology: Awake and alert, non focal  E ENT: mild pallor, no icterus, oral mucosa dry.  Cardiovascular: No JVD. S1-S2 present, rhythmic, no gallops, rubs, or murmurs. No lower extremity edema. Pulmonary: positive breath sounds bilaterally. Gastrointestinal. Abdomen with no organomegaly, non tender, no rebound or guarding Skin. No rashes Musculoskeletal: no joint deformities     Data Reviewed: I have personally reviewed following labs and imaging studies  CBC: Recent Labs  Lab 02/08/20 1545 02/09/20 0432 02/10/20 0000  WBC 5.3 4.0 4.7  NEUTROABS  --  3.5  --   HGB 12.7* 11.9* 11.6*  HCT 40.4 38.2* 36.8*  MCV 91.6 91.4 90.0  PLT 250 235 [id number]   Basic Metabolic Panel: Recent Labs  Lab 02/08/20 1545 02/09/20 0432 02/10/20 0000 02/11/20 0347  NA 137 137 142 137  K 4.7 5.2* 5.0 5.3*  CL 109 110 113* 109  CO2 18* 18* 20* 20*  GLUCOSE 122* 171* 167* 212*  BUN 47* 42* 48* 55*  CREATININE 1.35* 1.38* 1.36* 1.54*  CALCIUM 9.4 9.6 9.5 9.3  MG  --   --  2.0  --    GFR: Estimated Creatinine Clearance: 69.7 mL/min (A) (  by C-G formula based on SCr of 1.54 mg/dL (H)). Liver Function Tests: Recent Labs  Lab 02/08/20 1545 02/09/20 0432 02/11/20 0347  AST 41 35 71*  ALT 22 21 40  ALKPHOS 134* 129* 135*  BILITOT 0.7 0.5 0.5  PROT 7.6 7.7 7.3  ALBUMIN 3.2* 3.1* 2.9*   No results for input(s): LIPASE, AMYLASE in the last 168 hours. Recent Labs  Lab 02/08/20 1545  AMMONIA 54*   Coagulation Profile: Recent Labs  Lab 02/08/20 1940  INR 1.1   Cardiac Enzymes: No results for input(s): CKTOTAL, CKMB, CKMBINDEX, TROPONINI in the last 168 hours. BNP (last 3 results) No results for input(s): PROBNP in the last 8760 hours. HbA1C: Recent Labs    02/09/20 0432  HGBA1C 6.0*   CBG: Recent Labs  Lab 02/10/20 0732 02/10/20 1148 02/10/20 1621 02/10/20 2028 02/11/20 0716  GLUCAP 169* 191* 159* 188* 188*   Lipid Profile:  No results for input(s): CHOL, HDL, LDLCALC, TRIG, CHOLHDL, LDLDIRECT in the last 72 hours. Thyroid Function Tests: No results for input(s): TSH, T4TOTAL, FREET4, T3FREE, THYROIDAB in the last 72 hours. Anemia Panel: Recent Labs    02/10/20 0000 02/11/20 0347  FERRITIN 41 84      Radiology Studies: I have reviewed all of the imaging during this hospital visit personally     Scheduled Meds: . apixaban  10 mg Oral BID   Followed by  . [START ON 02/17/2020] apixaban  5 mg Oral BID  . vitamin C  500 mg Oral Daily  . chlorpheniramine-HYDROcodone  5 mL Oral Q12H  . dexamethasone (DECADRON) injection  6 mg Intravenous Q24H  . insulin aspart  0-5 Units Subcutaneous QHS  . insulin aspart  0-9 Units Subcutaneous TID WC  . Ipratropium-Albuterol  1 puff Inhalation Q6H  . nystatin  5 mL Oral QID  . sodium chloride flush  3 mL Intravenous Once  . sodium chloride flush  3 mL Intravenous Q12H  . zinc sulfate  220 mg Oral Daily   Continuous Infusions: . sodium chloride    . remdesivir 100 mg in NS 100 mL 100 mg (02/10/20 0820)     LOS: 3 days        Jesslynn Kruck Gerome Apley, MD

## 2020-02-11 NOTE — Plan of Care (Signed)
Pt slept well during the night. No complaints of pain verbalized. Alert and oriented. Vitals stable, managed to wean down O2 to 3L [place]. Moderate assist with ADLs. Urostomy draining well, unable to assess stoma as no spare bag and supplies available at the moment. Wound/Ostomy team referral done. Charge RN informed of supplies that need to be ordered in the AM. In the meantime, pt's wife said their daughter will bring in supplies today. Lower abdominal wound dressing changed.  Assisted regular repositioning for pressure relief. No other issues, will monitor.   Problem: Education: Goal: Knowledge of risk factors and measures for prevention of condition will improve 02/11/2020 0534 by Melvyn Neth, RN Outcome: Progressing 02/11/2020 0534 by Melvyn Neth, RN Outcome: Progressing   Problem: Coping: Goal: Psychosocial and spiritual needs will be supported 02/11/2020 0534 by Melvyn Neth, RN Outcome: Progressing 02/11/2020 0534 by Melvyn Neth, RN Outcome: Progressing   Problem: Respiratory: Goal: Will maintain a patent airway 02/11/2020 0534 by Melvyn Neth, RN Outcome: Progressing 02/11/2020 0534 by Melvyn Neth, RN Outcome: Progressing Goal: Complications related to the disease process, condition or treatment will be avoided or minimized 02/11/2020 0534 by Melvyn Neth, RN Outcome: Progressing 02/11/2020 [place] by Melvyn Neth, RN Outcome: Progressing

## 2020-02-11 NOTE — Telephone Encounter (Signed)
Pt called back and wants you to call her regarding ed states the dr told her he was suppose to come home and she is very concerned about that and she states that the dr told her that she talked to [place] and she has not talked to the dr she wants to talk to you again about ed she can be reached at [phone]

## 2020-02-11 NOTE — Progress Notes (Signed)
Patient much more alert and oriented now. Speech is clear and answers to questions are appropriate and correct now. Patient ate well for dinner and was talkative. Remains on 3lpm. IV's intact. No distress seen. No abnormal behaviors seen. Call bell within reach. Will continue to monitor.

## 2020-02-11 NOTE — Progress Notes (Signed)
Physical Therapy Treatment Patient Details Name: Jimmy [name] MRN: [id number] DOB: 1946/04/21 Today's Date: 02/11/2020    History of Present Illness 74 year old male admitted with COVID/PNA; PMH includes bladder cancer- ileus conduit with urinary diversion. Has OSA but generally does not use his CPAP. Has had multiple surgeries- including for bladder cancer- most recent Dec 2020    PT Comments    Patient was in [name] chair when PT entered room. Main screen telemetry was off- and all leads off. On O2 per [place]-  He presented with flat affect the entire session- required 2 rest breaks, and "too exhausted to attempt any ambulation". He required cues for all LE ex as well as the pursed lip breathing and use of the Flutter Valve unit. At end of session, legs elevated in the BS chair, cover on lap, Bedside table in reach, as well as remote/ call light and telephone. Should benefit from PT to further address goals to address for optimal functional outcomes.    Follow Up Recommendations  Home health PT(Dependes on progression here- he is extremely weak, fatigues easily)     Equipment Recommendations  Rolling walker with 5" wheels    Recommendations for Other Services       Precautions / Restrictions Precautions Precautions: Fall Precaution Comments: Monitor closely related to O2- He presents as confused- slow to respond and complete tasks today. Restrictions Weight Bearing Restrictions: No    Mobility  Bed Mobility Overal bed mobility: (He was in BS chair when PT entered room On O2, but all leads were off- lying on bedside table- Telemetry off (main screen))             General bed mobility comments: DId not attempt any transfers or ambulation- he was exhausted and lunch was to arrive soon- did agree to ex format  Transfers                    Ambulation/Gait                 Stairs             Wheelchair Mobility    Modified Rankin (Stroke Patients Only)        Balance Overall balance assessment: Mild deficits observed, not formally tested                                          Cognition Arousal/Alertness: Awake/alert Behavior During Therapy: Flat affect(Flat affect- slow to process and complete a task- presents as confused.)                                          Exercises General Exercises - Lower Extremity Ankle Circles/Pumps: AROM;Seated;Both Quad Sets: AROM;Seated;Both Short Arc Quad: AROM;Seated;Both Hip Flexion/Marching: AROM;Seated;Both Other Exercises Other Exercises: Instructed in Pursed lip breathing- he is a mouth breather--needed cues to properly use today Other Exercises: Practiced IS and only able to achieve 500 for 8 out of 10 reps Other Exercises: Good return demo for Flutter Valve unit    General Comments General comments (skin integrity, edema, etc.): Continue to monitor closely for skin and joint integrity. Both feet and ankles-> edema continues      Pertinent Vitals/Pain Pain Assessment: No/denies pain    Home Living  Prior Function            PT Goals (current goals can now be found in the care plan section) Acute Rehab PT Goals PT Goal Formulation: With patient Time For Goal Achievement: 02/10/20 Potential to Achieve Goals: Fair Progress towards PT goals: PT to reassess next treatment    Frequency    Min 3X/week      PT Plan      Co-evaluation              AM-PAC PT "6 Clicks" Mobility   Outcome Measure  Help needed turning from your back to your side while in a flat bed without using bedrails?: A Little Help needed moving from lying on your back to sitting on the side of a flat bed without using bedrails?: A Little Help needed moving to and from a bed to a chair (including a wheelchair)?: A Little Help needed standing up from a chair using your arms (e.g., wheelchair or bedside chair)?: A Little Help needed to  walk in hospital room?: A Little Help needed climbing 3-5 steps with a railing? : A Lot 6 Click Score: 17    End of Session   Activity Tolerance: Patient limited by fatigue Patient left: in chair;with call bell/phone within reach;with bed alarm set   PT Visit Diagnosis: Unsteadiness on feet (R26.81);Other abnormalities of gait and mobility (R26.89);Muscle weakness (generalized) (M62.81)     Time: [id number] PT Time Calculation (min) (ACUTE ONLY): 34 min  Charges:  $Therapeutic Exercise: 23-37 mins                   Rollen Sox, PT # [phone] CGV cell  Casandra Doffing 02/11/2020, 12:46 PM

## 2020-02-11 NOTE — Progress Notes (Addendum)
Updated wife on current condition, treatment plan and possible discharge date. Wife is hesitant about patient coming home too soon. If too sickly, she cant handle him. Also concerned if he my pass covid to her. Dr. Nat Math notified.

## 2020-02-12 LAB — COMPREHENSIVE METABOLIC PANEL
ALT: 65 U/L — ABNORMAL HIGH (ref 0–44)
AST: 105 U/L — ABNORMAL HIGH (ref 15–41)
Albumin: 3.1 g/dL — ABNORMAL LOW (ref 3.5–5.0)
Alkaline Phosphatase: 160 U/L — ABNORMAL HIGH (ref 38–126)
Anion gap: 11 (ref 5–15)
BUN: 60 mg/dL — ABNORMAL HIGH (ref 8–23)
CO2: 17 mmol/L — ABNORMAL LOW (ref 22–32)
Calcium: 9.4 mg/dL (ref 8.9–10.3)
Chloride: 110 mmol/L (ref 98–111)
Creatinine, Ser: 1.44 mg/dL — ABNORMAL HIGH (ref 0.61–1.24)
GFR calc Af Amer: 55 mL/min — ABNORMAL LOW (ref >60)
GFR calc non Af Amer: 48 mL/min — ABNORMAL LOW (ref >60)
Glucose, Bld: 218 mg/dL — ABNORMAL HIGH (ref 70–99)
Potassium: 5.1 mmol/L (ref 3.5–5.1)
Sodium: 138 mmol/L (ref 135–145)
Total Bilirubin: 0.8 mg/dL (ref 0.3–1.2)
Total Protein: 7.5 g/dL (ref 6.5–8.1)

## 2020-02-12 LAB — GLUCOSE, CAPILLARY
Glucose-Capillary: 179 mg/dL — ABNORMAL HIGH (ref 70–99)
Glucose-Capillary: 214 mg/dL — ABNORMAL HIGH (ref 70–99)
Glucose-Capillary: 199 mg/dL — ABNORMAL HIGH (ref 70–99)
Glucose-Capillary: 268 mg/dL — ABNORMAL HIGH (ref 70–99)
Glucose-Capillary: 242 mg/dL — ABNORMAL HIGH (ref 70–99)

## 2020-02-12 LAB — CBC
HCT: 41 % (ref 39.0–52.0)
Hemoglobin: 13 g/dL (ref 13.0–17.0)
MCH: 28.8 pg (ref 26.0–34.0)
MCHC: 31.7 g/dL (ref 30.0–36.0)
MCV: 90.9 fL (ref 80.0–100.0)
Platelets: 291 10*3/uL (ref 150–400)
RBC: 4.51 MIL/uL (ref 4.22–5.81)
RDW: 15.9 % — ABNORMAL HIGH (ref 11.5–15.5)
WBC: 8.2 10*3/uL (ref 4.0–10.5)
nRBC: 0 % (ref 0.0–0.2)

## 2020-02-12 LAB — FERRITIN: Ferritin: 44 ng/mL (ref 24–336)

## 2020-02-12 LAB — D-DIMER, QUANTITATIVE: D-Dimer, Quant: 1.71 ug/mL-FEU — ABNORMAL HIGH (ref 0.00–0.50)

## 2020-02-12 LAB — C-REACTIVE PROTEIN: CRP: 1 mg/dL — ABNORMAL HIGH (ref <1.0)

## 2020-02-12 MED ORDER — ENSURE ENLIVE PO LIQD
237.0000 mL | Freq: Three times a day (TID) | ORAL | Status: DC
  Administered 2020-02-12 – 2020-02-18 (×15): 237 mL via ORAL

## 2020-02-12 MED ORDER — PANTOPRAZOLE SODIUM 40 MG IV SOLR
40.0000 mg | Freq: Two times a day (BID) | INTRAVENOUS | Status: DC
  Administered 2020-02-12 – 2020-02-20 (×17): 40 mg via INTRAVENOUS
  Filled 2020-02-12 (×17): qty 40

## 2020-02-12 MED ORDER — RESOURCE THICKENUP CLEAR PO POWD
ORAL | Status: DC | PRN
  Filled 2020-02-12: qty 125

## 2020-02-12 NOTE — Progress Notes (Signed)
Abdominal dressing change per doctors order.   Patients bowel movement consist of mucous like bright red blood. Physician notified and report given to nurse during shift change.

## 2020-02-12 NOTE — Progress Notes (Signed)
Initial Nutrition Assessment  DOCUMENTATION CODES:   Not applicable  INTERVENTION:   Recommend obtain accurate weight.   Ensure Enlive po TID, each supplement provides 350 kcal and 20 grams of protein.  Pt receiving Hormel Shake daily with Breakfast which provides 520 kcals and 22 g of protein and Magic cup BID with lunch and dinner, each supplement provides 290 kcal and 9 grams of protein, automatically on meal trays to optimize nutritional intake.   NUTRITION DIAGNOSIS:   Increased nutrient needs related to acute illness, catabolic illness(COVID) as evidenced by estimated needs.  GOAL:   Patient will meet greater than or equal to 90% of their needs  MONITOR:   PO intake, Supplement acceptance, Labs, Weight trends, Skin  REASON FOR ASSESSMENT:   Consult Assessment of nutrition requirement/status  ASSESSMENT:   74 yo male admitted with respiratory distress r/t COVID-19 PNA. Tested positive for COVID in the ED. PMH includes CKD, HTN, bladder cancer, CAD.   Attempted to reach patient by phone, but no one answered phone call. Unable to get in touch with RN at this time either.  Suspect documented weight of 185.7 kg was entered incorrectly as the next most recent weight was 94.5 kg on 01/27/20.   Patient is currently requiring 3 L oxygen via nasal cannula.  He is on a heart healthy diet and has been eating 10-75% of meals since admission, average intake 25%. Patient would benefit from nutrient dense PO supplements to ensure adequate intake to meet increased nutrition needs for COVID-19.  Labs reviewed.  CBG's: 214-179  Medications reviewed and include vitamin C, zinc, decadron, novolog, MVI, lokelma, thiamine.  NUTRITION - FOCUSED PHYSICAL EXAM:  unable to complete  Diet Order:   Diet Order            Diet Heart Room service appropriate? Yes; Fluid consistency: Thin  Diet effective now              EDUCATION NEEDS:   Not appropriate for education at this  time  Skin:  Skin Assessment: Reviewed RN Assessment  Last BM:  2/12  Height:   Ht Readings from Last 1 Encounters:  02/08/20 5\' 8"  (1.727 m)    Weight:   Wt Readings from Last 1 Encounters:  02/08/20 (!) 185.7 kg   Suspect this weight is incorrect. Patient weighed 94.5 kg on 1/27. For the past 5 months, weight has ranged from 110.7 kg to 116.4 kg.  Ideal Body Weight:  70 kg  Estimated Nutritional Needs:   Kcal:  2000-2200  Protein:  115-130 gm  Fluid:  >/= 2 L    Molli Barrows, RD, LDN, CNSC Please refer to Amion for contact information.

## 2020-02-12 NOTE — Progress Notes (Signed)
Physical Therapy Treatment Patient Details Name: TARRENCE ROMICK MRN: [id number] DOB: 27-Nov-1946 Today's Date: 02/12/2020    History of Present Illness 74 year old male admitted with COVID/PNA; PMH includes bladder cancer- ileus conduit with urinary diversion. Has OSA but generally does not use his CPAP. Has had multiple surgeries- including for bladder cancer- most recent Dec 2020    PT Comments    Pt is still extremely lethargic and seems to be quite dazed this am. He is able to follow simple step by step instructions for mobility and with min/mod a able to complete mobility tasks but doesn't verbalize much with therapist. He was able to complete supine to sit with min assist and bed rails able to sit edge of bed with UE support x approx 10 mins, able to stand with RW and mod a x 2, then able to stand and take pivotal steps to recliner with mod a. Pt was on 3L/min via [place] and was noted to desat to min 84% with activity. Pt was not able to correctly complete pursed lip breathing and was noted to be taking short shallow breaths. Even with cues pt not able to complete pursed lip breathing correctly. With mouth breathing was able to recover slowly and regain saturations to low 90s. Therapist noted with each time pt stood up has some bloody stool on bed pad, nurse was in room to also observe this. Currently pt will need more care and supervision at d/c than previously anticipated.     Follow Up Recommendations  SNF     Equipment Recommendations  Rolling walker with 5" wheels    Recommendations for Other Services       Precautions / Restrictions Precautions Precautions: Fall Precaution Comments: Monitor closely related to O2- He presents as confused- slow to respond and complete tasks today. Restrictions Weight Bearing Restrictions: No    Mobility  Bed Mobility Overal bed mobility: Needs Assistance Bed Mobility: Supine to Sit Rolling: Min assist   Supine to sit: Min assist         Transfers Overall transfer level: Needs assistance Equipment used: Rolling walker (2 wheeled) Transfers: Sit to/from [facility] Sit to Stand: Mod assist Stand pivot transfers: Mod assist       General transfer comment: needs step by step instructions to complete   Ambulation/Gait             General Gait Details: only able to take small pivotal steps to recliner from bed   Stairs             Wheelchair Mobility    Modified Rankin (Stroke Patients Only)       Balance Overall balance assessment: Needs assistance Sitting-balance support: Feet supported;Bilateral upper extremity supported Sitting balance-Leahy Scale: Fair     Standing balance support: During functional activity;Bilateral upper extremity supported Standing balance-Leahy Scale: Poor                              Cognition Arousal/Alertness: Lethargic Behavior During Therapy: Flat affect Overall Cognitive Status: Impaired/Different from baseline                                 General Comments: not verbally responding to much, will attempt mobility with step by step cues. Mostly seems to be staring off into space      Exercises  General Comments        Pertinent Vitals/Pain Pain Assessment: Faces Faces Pain Scale: Hurts a little bit Pain Location: general discomfort    Home Living                      Prior Function            PT Goals (current goals can now be found in the care plan section) Acute Rehab PT Goals Patient Stated Goal: did not say much during session PT Goal Formulation: With patient Time For Goal Achievement: 02/10/20 Potential to Achieve Goals: Fair Progress towards PT goals: Not progressing toward goals - comment(extremely lethargic during session)    Frequency    Min 3X/week      PT Plan Discharge plan needs to be updated    Co-evaluation              AM-PAC PT "6 Clicks" Mobility    Outcome Measure  Help needed turning from your back to your side while in a flat bed without using bedrails?: A Little Help needed moving from lying on your back to sitting on the side of a flat bed without using bedrails?: A Little Help needed moving to and from a bed to a chair (including a wheelchair)?: A Little Help needed standing up from a chair using your arms (e.g., wheelchair or bedside chair)?: A Lot Help needed to walk in hospital room?: A Lot Help needed climbing 3-5 steps with a railing? : A Lot 6 Click Score: 15    End of Session Equipment Utilized During Treatment: Oxygen Activity Tolerance: Patient limited by fatigue;Patient limited by lethargy;Treatment limited secondary to medical complications (Comment) Patient left: in chair;with call bell/phone within reach;with chair alarm set;with nursing/sitter in room Nurse Communication: Mobility status PT Visit Diagnosis: Unsteadiness on feet (R26.81);Other abnormalities of gait and mobility (R26.89);Muscle weakness (generalized) (M62.81)     Time: [id number] PT Time Calculation (min) (ACUTE ONLY): 26 min  Charges:  $Therapeutic Activity: 23-37 mins                     Horald Chestnut, PT    Delford Field 02/12/2020, 1:20 PM

## 2020-02-12 NOTE — Progress Notes (Signed)
Spoke to patients spouse Rivaan Mcnelis on an update about patients care. Spouse stated, "are you stopping or slowing down the eliquise? What about the clot in his legs?" Patient's spouse expressed those concern post my sharing of the intervention to hold eliquise related to tinge of blood in stool reported to physician.   I shared information on the patients diet change. [name] also expressed that her husband is deaf in the right ear. [name] spoke to her husband as I held the phone to the patients ears, conversation ended in tears. [name] thanked me for allowing her to speak to her husband, and we plan on doing the same tomorrow night on my upcoming shift.

## 2020-02-12 NOTE — Progress Notes (Addendum)
PROGRESS NOTE    Jimmy [name]  [id number] DOB: 1946-09-17 DOA: 02/08/2020 PCP: Jimmy Lung, MD    Brief Narrative:  Patient was admitted to the hospital with a working diagnosis of acute hypoxic respiratory failure due to SARS COVID-19 viral pneumonia. Hospitalization complicated with left lower extremity DVT, metabolic encephalopathy and gi bleed.   74 year old male who presented with dyspnea. He does have significant past medical history for chronic kidney disease, hypertension, bladder cancer and coronary artery disease. Patient reported not feeling well for several days at home, having dyspnea and cough. Home health nurse noticed him to be more confused having increased oxygen requirements. On his initial physical examination in the emergency department he was in respiratory distress and placed on nonrebreather mask,with oxygen saturation 90%, his blood pressure was 129/79, heart rate 102, respiratory rate28.His lungs were clear to auscultation, no wheezing or rhonchi, heart S1-S2, present with, abdomen was soft and nontender, no lower extremity edema.Urinalysis specific gravity 1.012, 0-5 red cells, 11-20 white cells, large leukocytes. Chest radiograph with left lower lobe interstitial infiltrate, increased bilateral markings. CT chest negative for pulmonary embolism, bilateral apical groundglass opacities bilateral subpleural groundglass infiltrates.  Patient has received supplemental oxygen per high flow nasal cannula, medical therapy with remdesivir and dexamethasone. For persistent hypoxic respiratory failure he received SARS COVID-19 convalescent plasma.  He continued to have high oxygen requirements. Ultrasonography lower extremities was positive for deep vein thrombosis on the left.  Patient responded well to diuresis for non cardiogenic pulmonary edema with improvement of oxygenation.   Urothelial cancer: 06/22/2019. SP TURB with bilateral ureteral stents  placement/final pathology showed high-grade infiltrating papillary adenocarcinoma, invading into the detrusor muscle with lymphovascular invasion.  .  08/07/2019 patient underwent neoadjuvant chemotherapy, 4 cycles completed October 16, 2019. 12/2019 underwent radical cystectomy and lymph node dissection.    Final pathology showed 1.5 cm residual tumor with invasion into the perivascular soft tissue.  Final pathology stage T38 and 0 with 0 out of 10 lymph nodes involved.  Currently under active surveillance.   Assessment & Plan:   Principal Problem:   Pneumonia due to COVID-19 virus Active Problems:   Hypertension associated with type 2 diabetes mellitus ([place])   Malignant neoplasm of urinary bladder (HCC)   Acute respiratory failure due to COVID-19 ([place])   1. Acute hypoxic respiratory failure due to SARS COVID 19 viral pneumonia. sp remdesivir #5/5.   RR: 21  Pulse oxymetry: 92%  Fi02: 3 L/ min per [name]  COVID-19 Labs  Recent Labs    02/10/20 0000 02/11/20 0347 02/12/20 0142  DDIMER 1.74* 1.47* 1.71*  FERRITIN 41 46 44  LDH 124  --   --   CRP 4.1* 1.4* 1.0*    Lab Results  Component Value Date   SARSCOV2NAA POSITIVE (A) 02/08/2020   [place] NEGATIVE 12/23/2019   SARSCOV2NAA NOT DETECTED 12/07/2019   [place] NEGATIVE 06/18/2019   Improved oxygenationand inflammatory markers, but patient is very deconditioned and decrease responsiveness.   Will plan to dc steroids in am. Continue antitussive agents, bronchodilators and airway clearing techniques with flutter valve and incentive spirometer.  Patient is very weak and deconditioned, poor oral intake, will follow recommendations from nutrition. Continue PT and OT.    2. Acute left lower extremity DVT/ complicated by NEW bloody stools. his hgb is 13 this am with Hct at 41. Will hold on epixaban for now and continue monitoring any signs of bleeding. Will add pantoprazole IV BID. Follow on H&H in am.  If persistent  bleeding will consider IVC filter but patient very debilitated and high risk for worsening encephalopathy if exposed to sedation.   3. Metabolic encephalopathy. Head CT on admission with atrophy and chronic microvascular disease. Continue nutritional support and physical therapy, continue to have very poor response, and poor oral intake, has high risk for worsening.  4. 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 No clear about urinary symptoms, His culture was positive for coagulase negative staph 80, 000 CFS and aerococcus >100, 000 CFU, likely a contamination. Will continue to monitor off antibiotic therapy for now. No clinical signs of infection.   5. CKD stage 3a with hyperkalemia/ non anion gap metabolic acidosis. Renal function with serum cr at 1,44, with K at 5,1 and serum bicarbonate at 17.  Continue with sodium zirconium 10 TID. Continue close follow up of renal function and electrolytes, avoid hypotension or nephrotoxic medications. Patient with very poor oral intake.   6. T2DM(Hgb A1c 6.0). Fasting glucose this am at 218.  Continue glucose cover and monitoring with insulin sliding scale, hold on basal insulin due to poor oral intake.   DVT prophylaxis:apixaban Code Status:full Family Communication:I spoke over the phone with the patient's  daughter about patient's  condition, plan of care, prognosis and all questions were addressed. Disposition Plan/ discharge barriers:Possible dc in am, pending oxygenation and mentation.    Subjective: Patient continue to be very weak and deconditioned, poor oral intake, had bloody stools last night. Denies any abdominal pain, nausea or vomiting.    Objective: Vitals:   02/11/20 1655 02/12/20 0000 02/12/20 0400 02/12/20 0745  BP: 137/87 132/75 138/83 131/87  Pulse: (!) 103 (!) 101 99 100  Resp: 12 18 20  (!) 21  Temp: 98 F (36.7 C) 100.3 F (37.9 C) 98.4 F (36.9 C)   TempSrc: Oral Oral Oral   SpO2: 94%  94% 93% 92%  Weight:      Height:        Intake/Output Summary (Last 24 hours) at 02/12/2020 0914 Last data filed at 02/12/2020 0630 Gross per 24 hour  Intake 370 ml  Output 1550 ml  Net -1180 ml   Filed Weights   02/08/20 1450 02/08/20 2230  Weight: 94.5 kg (!) 185.7 kg    Examination:   General: deconditioned and ill looking appearing.  Neurology: Awake and alert, non focal  E ENT: mild pallor, no icterus, oral mucosa dry.  Cardiovascular: No JVD. S1-S2 present, rhythmic, no gallops, rubs, or murmurs. Trace non pitting bilateral lower extremity edema. Pulmonary:  Positive breath sounds bilaterally. Gastrointestinal. Abdomen with no organomegaly, non tender, no rebound or guarding Skin. No rashes Musculoskeletal: no joint deformities     Data Reviewed: I have personally reviewed following labs and imaging studies  CBC: Recent Labs  Lab 02/08/20 1545 02/09/20 0432 02/10/20 0000  WBC 5.3 4.0 4.7  NEUTROABS  --  3.5  --   HGB 12.7* 11.9* 11.6*  HCT 40.4 38.2* 36.8*  MCV 91.6 91.4 90.0  PLT 250 235 [id number]   Basic Metabolic Panel: Recent Labs  Lab 02/08/20 1545 02/09/20 0432 02/10/20 0000 02/11/20 0347 02/12/20 0142  NA 137 137 142 137 138  K 4.7 5.2* 5.0 5.3* 5.1  CL 109 110 113* 109 110  CO2 18* 18* 20* 20* 17*  GLUCOSE 122* 171* 167* 212* 218*  BUN 47* 42* 48* 55* 60*  CREATININE 1.35* 1.38* 1.36* 1.54* 1.44*  CALCIUM 9.4 9.6 9.5 9.3 9.4  MG  --   --  2.0  --   --    GFR: Estimated Creatinine Clearance: 74.5 mL/min (A) (by C-G formula based on SCr of 1.44 mg/dL (H)). Liver Function Tests: Recent Labs  Lab 02/08/20 1545 02/09/20 0432 02/11/20 0347 02/12/20 0142  AST 41 35 71* 105*  ALT 22 21 40 65*  ALKPHOS 134* 129* 135* 160*  BILITOT 0.7 0.5 0.5 0.8  PROT 7.6 7.7 7.3 7.5  ALBUMIN 3.2* 3.1* 2.9* 3.1*   No results for input(s): LIPASE, AMYLASE in the last 168 hours. Recent Labs  Lab 02/08/20 1545  AMMONIA 54*   Coagulation  Profile: Recent Labs  Lab 02/08/20 1940  INR 1.1   Cardiac Enzymes: No results for input(s): CKTOTAL, CKMB, CKMBINDEX, TROPONINI in the last 168 hours. BNP (last 3 results) No results for input(s): PROBNP in the last 8760 hours. HbA1C: No results for input(s): HGBA1C in the last 72 hours. CBG: Recent Labs  Lab 02/11/20 1141 02/11/20 1655 02/11/20 2001 02/12/20 0751 02/12/20 0825  GLUCAP 189* 205* 198* 214* 179*   Lipid Profile: No results for input(s): CHOL, HDL, LDLCALC, TRIG, CHOLHDL, LDLDIRECT in the last 72 hours. Thyroid Function Tests: No results for input(s): TSH, T4TOTAL, FREET4, T3FREE, THYROIDAB in the last 72 hours. Anemia Panel: Recent Labs    02/11/20 0347 02/12/20 0142  FERRITIN 109 44      Radiology Studies: I have reviewed all of the imaging during this hospital visit personally     Scheduled Meds: . apixaban  10 mg Oral BID   Followed by  . [START ON 02/17/2020] apixaban  5 mg Oral BID  . vitamin C  500 mg Oral Daily  . chlorpheniramine-HYDROcodone  5 mL Oral Q12H  . dexamethasone (DECADRON) injection  6 mg Intravenous Q24H  . insulin aspart  0-5 Units Subcutaneous QHS  . insulin aspart  0-9 Units Subcutaneous TID WC  . Ipratropium-Albuterol  1 puff Inhalation Q6H  . multivitamin with minerals  1 tablet Oral Daily  . nystatin  5 mL Oral QID  . sodium chloride flush  3 mL Intravenous Once  . sodium chloride flush  3 mL Intravenous Q12H  . sodium zirconium cyclosilicate  10 g Oral TID  . thiamine  100 mg Oral Daily  . zinc sulfate  220 mg Oral Daily   Continuous Infusions: . sodium chloride    . remdesivir 100 mg in NS 100 mL 100 mg (02/11/20 0915)     LOS: 4 days        Sukanya Goldblatt Gerome Apley, MD

## 2020-02-12 NOTE — Evaluation (Signed)
Clinical/Bedside Swallow Evaluation Patient Details  Name: Jimmy [name] MRN: [id number] Date of Birth: 1946/09/13  Today's Date: 02/12/2020 Time: SLP Start Time (ACUTE ONLY): [id number] SLP Stop Time (ACUTE ONLY): 1630 SLP Time Calculation (min) (ACUTE ONLY): 21 min  Past Medical History:  Past Medical History:  Diagnosis Date  . Astigmatism of both eyes 11/01/2016  . Bronchitis   . Coronary artery disease   . Cortical age-related cataract of both eyes 11/01/2016  . Diabetes mellitus without complication ([place])    type 2  . Drug-induced osteoporosis   . Dyslipidemia   . Fatigue   . Fatty liver   . History of kidney stones   . Hypercholesteremia   . Hypogonadism male   . 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 Normal nuclear stress test March 2011  . Nuclear sclerotic cataract of both eyes 11/01/2016  . Obesity   . Prostate cancer [facility])    s/p seed implant and radiation  . Sleep apnea    90% of time dosent wear his cpap per pt  . Vitamin D insufficiency    Past Surgical History:  Past Surgical History:  Procedure Laterality Date  . BUBBLE STUDY  12/30/2019   Procedure: BUBBLE STUDY;  Surgeon: Pixie Casino, MD;  Location: [place];  Service: Cardiovascular;;  . CARDIAC CATHETERIZATION  April 2000  . IR IMAGING GUIDED PORT INSERTION  07/30/2019  . IR REMOVAL TUN ACCESS W/ PORT W/O FL MOD SED  12/26/2019  . RADIOACTIVE SEED IMPLANT  2009   Implanted radiation seeds  . TEE WITHOUT CARDIOVERSION N/A 12/30/2019   Procedure: TRANSESOPHAGEAL ECHOCARDIOGRAM (TEE);  Surgeon: Pixie Casino, MD;  Location: [facility] ENDOSCOPY;  Service: Cardiovascular;  Laterality: N/A;  . thumb surgery     as a teen caught thumb in machine  . TRANSURETHRAL RESECTION OF BLADDER TUMOR N/A 06/22/2019   Procedure: TRANSURETHRAL RESECTION OF BLADDER TUMOR (TURBT), CYSTOSCOPY,  BILATERAL RETROGRADE PYELOGRAPHY,  BILATERAL STENT PLACEMENT;  Surgeon: Raynelle Bring, MD;  Location: [facility] ORS;  Service: Urology;  Laterality: N/A;  GENERAL WITH PARALYSIS   HPI:  74 year old male admitted with COVID/PNA; PMH bladder cancer, OSA, DM, bronchitis, CAD. OT  witnessed pt coughing with 3/4 sips water.   Assessment / Plan / Recommendation Clinical Impression  Pt referred to ST services by OT after witnessing pt cough frequently after liquid consumption. Pt found to have a flat affect and delayed responses throughout assessment. Voice is primarily aphonic with periods of phonation when respirations are supportive. He could not produce a cough. Pharyngeal dysphagia suspected to be from multifactorial source including respiratory, phonatory, esophageal involvement and cognitive. Immediate and delayed coughs with majority of thin indicative of possible airway intrusion. Multiple sequential post swallows concerning for pharyngeal residue/UES dysfunction, eructation and audible swallow. His vitals remained stable and did not demonstrate significant increase in respiratory effort. Nectar thick lessened s/s aspiration but honey thick appeared to allow for more coordinated pattern of respiration and swallow and post swallowing was minimized. Downgrade liquids to honey thick, texture to Dys 3, meds whole in puree and continued ST intervention for upgrade at bedside versus need for instrumental study.      SLP Visit Diagnosis: Dysphagia, unspecified (R13.10)    Aspiration Risk  Mild aspiration risk;Moderate aspiration risk    Diet Recommendation Dysphagia 3 (Mech soft);Honey-thick liquid   Liquid Administration via: Cup Medication  Administration: Whole meds with puree Supervision: Patient able to self feed;Full supervision/cueing for compensatory strategies Compensations: Minimize environmental distractions;Slow rate;Small sips/bites;Lingual sweep for clearance of pocketing Postural Changes: Seated upright at 90 degrees    Other   Recommendations Oral Care Recommendations: Oral care BID Other Recommendations: Order thickener from pharmacy   Follow up Recommendations 24 hour supervision/assistance      Frequency and Duration min 2x/week  2 weeks       Prognosis Prognosis for Safe Diet Advancement: Good Barriers to Reach Goals: Cognitive deficits      Swallow Study   General HPI: 74 year old male admitted with COVID/PNA; PMH bladder cancer, OSA, DM, bronchitis, CAD. OT  witnessed pt coughing with 3/4 sips water. Type of Study: Bedside Swallow Evaluation Previous Swallow Assessment: none Diet Prior to this Study: Regular;Thin liquids Temperature Spikes Noted: No Respiratory Status: Nasal cannula History of Recent Intubation: No Behavior/Cognition: Alert;Other (Comment);Distractible;Cooperative(flat, delayed) Oral Cavity Assessment: Within Functional Limits Oral Care Completed by SLP: No Oral Cavity - Dentition: Adequate natural dentition Self-Feeding Abilities: Able to feed self;Needs set up Patient Positioning: Upright in chair Baseline Vocal Quality: Hoarse;Other (comment)(periods aphonia) Volitional Cough: Weak    Oral/Motor/Sensory Function Overall Oral Motor/Sensory Function: Within functional limits   Ice Chips Ice chips: Not tested   Thin Liquid Thin Liquid: Impaired Presentation: Cup;Straw Oral Phase Impairments: Reduced labial seal Oral Phase Functional Implications: Right anterior spillage;Left anterior spillage Pharyngeal  Phase Impairments: Multiple swallows;Suspected delayed Swallow;Cough - Immediate;Cough - Delayed    Nectar Thick Nectar Thick Liquid: Impaired Presentation: Cup Pharyngeal Phase Impairments: Throat Clearing - Delayed;Multiple swallows   Honey Thick Honey Thick Liquid: Impaired Presentation: Cup Pharyngeal Phase Impairments: Multiple swallows   Puree Puree: Within functional limits   Solid     Solid: Impaired Pharyngeal Phase Impairments: Throat Clearing - Immediate       Houston Siren 02/12/2020,5:26 PM  Orbie Pyo Colvin Caroli.Ed Risk analyst [phone] Office [phone]

## 2020-02-12 NOTE — Progress Notes (Signed)
Family Update:  I spoke with the pt's daughter regarding his care. I gave her updates and answered all questions. She was specifically concerned about possible discharge planning, and had expressed concern that if he was still weak and would need 24h care when discharged that she would be more open to sending him to a rehab facility. She referenced that the pt's wife is older as well and unable to help meet the physical demands the pt may have at discharge. I reassured her that we have case managers who will work with family over their concerns at discharge time but at this time there is no immediate plan to discharge the pt today. No further questions at this time.

## 2020-02-12 NOTE — Progress Notes (Signed)
Occupational Therapy Treatment Patient Details Name: Jimmy [name] MRN: [id number] DOB: 02-13-1946 Today's Date: 02/12/2020    History of present illness 74 year old male admitted with COVID/PNA; PMH includes bladder cancer- ileus conduit with urinary diversion. Has OSA but generally does not use his CPAP. Has had multiple surgeries- including for bladder cancer- most recent Dec 2020   OT comments  Desats to high 70s on 3L with sit - stand. Total assist with pericare and noted bloody BM - nsg notified. Unable to cognitively process to march in place and pt states "I'm too weak". Completed grooming  Pt thinks he is at [employer] to "water the plants". Assisted pt with self feeding and noted pt coughing after most sips of thin liquids.  ST to see pt to further assess swallowing. Continue to recommend SNF for rehab.   Follow Up Recommendations  Supervision/Assistance - 24 hour;SNF    Equipment Recommendations  3 in 1 bedside commode    Recommendations for Other Services      Precautions / Restrictions Precautions Precautions: Fall Precaution Comments: Monitor closely related to O2- He presents as confused- slow to respond and complete tasks today. Restrictions Weight Bearing Restrictions: No       Mobility Bed Mobility               General bed mobility comments: OOB in chair  Transfers Overall transfer level: Needs assistance Equipment used: Rolling walker (2 wheeled) Transfers: Sit to/from Stand Sit to Stand: Mod assist         General transfer comment: increased assistance today    Balance Overall balance assessment: Needs assistance                                         ADL either performed or assessed with clinical judgement   ADL Overall ADL's : Needs assistance/impaired Eating/Feeding: Supervision/ safety;Set up;Sitting   Grooming: Minimal assistance;Sitting   Upper Body Bathing: Moderate assistance;Sitting                            Functional mobility during ADLs: Rolling walker;Cueing for safety;Moderate assistance General ADL Comments: tolerated standing @ 2 min for pericare - bloody BM - nsg notified. Pt unable to stand agian "I'm too tired"     Vision       Perception     Praxis      Cognition Arousal/Alertness: Lethargic Behavior During Therapy: Flat affect;Anxious Overall Cognitive Status: Impaired/Different from baseline Area of Impairment: Orientation;Attention;Memory;Following commands;Safety/judgement;Awareness;Problem solving                 Orientation Level: Disoriented to;Place;Time;Situation Current Attention Level: Sustained Memory: Decreased short-term memory Following Commands: Follows one step commands with increased time Safety/Judgement: Decreased awareness of safety;Decreased awareness of deficits Awareness: Emergent Problem Solving: Slow processing;Decreased initiation;Difficulty sequencing;Requires verbal cues;Requires tactile cues General Comments: better attention and particvipation however continues to have a "dazed" appearance        Exercises     Shoulder Instructions       General Comments Appears Catatonic at times    Pertinent Vitals/ Pain       Pain Assessment: No/denies pain  Home Living  Prior Functioning/Environment              Frequency  Min 3X/week        Progress Toward Goals  OT Goals(current goals can now be found in the care plan section)  Progress towards OT goals: Progressing toward goals  Acute Rehab OT Goals Patient Stated Goal: none stated OT Goal Formulation: Patient unable to participate in goal setting Time For Goal Achievement: 02/25/20 Potential to Achieve Goals: Good ADL Goals Pt Will Perform Grooming: with set-up;sitting Pt Will Perform Upper Body Bathing: with set-up;sitting Pt Will Perform Lower Body Bathing: with set-up;with  supervision;sit to/from stand Pt Will Transfer to Toilet: with supervision;ambulating Pt Will Perform Toileting - Clothing Manipulation and hygiene: with supervision;sit to/from stand Additional ADL Goal #1: Pt will demosntrate anticipatory awareness during ADL activities in minimally distracting environment. Additional ADL Goal #2: Maintain SpO2 above 90 during ADL adn functional mobility  Plan Discharge plan remains appropriate    Co-evaluation                 AM-PAC OT "6 Clicks" Daily Activity     Outcome Measure   Help from another person eating meals?: A Little Help from another person taking care of personal grooming?: A Little Help from another person toileting, which includes using toliet, bedpan, or urinal?: Total Help from another person bathing (including washing, rinsing, drying)?: A Lot Help from another person to put on and taking off regular upper body clothing?: A Little Help from another person to put on and taking off regular lower body clothing?: A Lot 6 Click Score: 14    End of Session Equipment Utilized During Treatment: Gait belt;Rolling walker;Oxygen(3-4 L)  OT Visit Diagnosis: Unsteadiness on feet (R26.81);Other abnormalities of gait and mobility (R26.89);Muscle weakness (generalized) (M62.81);Other symptoms and signs involving cognitive function;Pain   Activity Tolerance Patient limited by lethargy   Patient Left in chair;with call bell/phone within reach;with chair alarm set   Nurse Communication Mobility status;Other (comment)(concerns regarding coughing with thin liquids)        Time: [id number] OT Time Calculation (min): 45 min  Charges: OT General Charges $OT Visit: 1 Visit OT Treatments $Self Care/Home Management : 38-52 mins  [name], OT/L   Acute OT Clinical Specialist [place] Pager [phone] Office [phone]    [facility] 02/12/2020, 3:09 PM

## 2020-02-13 LAB — COMPREHENSIVE METABOLIC PANEL
ALT: 76 U/L — ABNORMAL HIGH (ref 0–44)
AST: 98 U/L — ABNORMAL HIGH (ref 15–41)
Albumin: 3.1 g/dL — ABNORMAL LOW (ref 3.5–5.0)
Alkaline Phosphatase: 150 U/L — ABNORMAL HIGH (ref 38–126)
Anion gap: 10 (ref 5–15)
BUN: 66 mg/dL — ABNORMAL HIGH (ref 8–23)
CO2: 19 mmol/L — ABNORMAL LOW (ref 22–32)
Calcium: 9.4 mg/dL (ref 8.9–10.3)
Chloride: 111 mmol/L (ref 98–111)
Creatinine, Ser: 1.43 mg/dL — ABNORMAL HIGH (ref 0.61–1.24)
GFR calc Af Amer: 56 mL/min — ABNORMAL LOW (ref >60)
GFR calc non Af Amer: 48 mL/min — ABNORMAL LOW (ref >60)
Glucose, Bld: 160 mg/dL — ABNORMAL HIGH (ref 70–99)
Potassium: 4.4 mmol/L (ref 3.5–5.1)
Sodium: 140 mmol/L (ref 135–145)
Total Bilirubin: 0.5 mg/dL (ref 0.3–1.2)
Total Protein: 7 g/dL (ref 6.5–8.1)

## 2020-02-13 LAB — FERRITIN: Ferritin: 42 ng/mL (ref 24–336)

## 2020-02-13 LAB — GLUCOSE, CAPILLARY
Glucose-Capillary: 218 mg/dL — ABNORMAL HIGH (ref 70–99)
Glucose-Capillary: 204 mg/dL — ABNORMAL HIGH (ref 70–99)
Glucose-Capillary: 220 mg/dL — ABNORMAL HIGH (ref 70–99)
Glucose-Capillary: 251 mg/dL — ABNORMAL HIGH (ref 70–99)

## 2020-02-13 LAB — CBC
HCT: 37.4 % — ABNORMAL LOW (ref 39.0–52.0)
Hemoglobin: 11.6 g/dL — ABNORMAL LOW (ref 13.0–17.0)
MCH: 28.5 pg (ref 26.0–34.0)
MCHC: 31 g/dL (ref 30.0–36.0)
MCV: 91.9 fL (ref 80.0–100.0)
Platelets: 235 10*3/uL (ref 150–400)
RBC: 4.07 MIL/uL — ABNORMAL LOW (ref 4.22–5.81)
RDW: 15.8 % — ABNORMAL HIGH (ref 11.5–15.5)
WBC: 6.5 10*3/uL (ref 4.0–10.5)
nRBC: 0 % (ref 0.0–0.2)

## 2020-02-13 LAB — TYPE AND SCREEN
ABO/RH(D): O POS
Antibody Screen: NEGATIVE

## 2020-02-13 LAB — C-REACTIVE PROTEIN: CRP: 0.8 mg/dL (ref <1.0)

## 2020-02-13 LAB — D-DIMER, QUANTITATIVE: D-Dimer, Quant: 1.44 ug/mL-FEU — ABNORMAL HIGH (ref 0.00–0.50)

## 2020-02-13 MED ORDER — ENOXAPARIN SODIUM 40 MG/0.4ML ~~LOC~~ SOLN
40.0000 mg | SUBCUTANEOUS | Status: DC
  Administered 2020-02-13: 15:00:00 40 mg via SUBCUTANEOUS
  Filled 2020-02-13: qty 0.4

## 2020-02-13 NOTE — Progress Notes (Signed)
  Speech Language Pathology Treatment: Dysphagia  Patient Details Name: Jimmy [name] MRN: [id number] DOB: 05-12-1946 Today's Date: 02/13/2020 Time: [id number] SLP Time Calculation (min) (ACUTE ONLY): 10 min  Assessment / Plan / Recommendation Clinical Impression  Pt was seen for skilled ST targeting diet tolerance and diagnostic treatment.  He was encountered awake/alert, sitting upright in a chair and he was agreeable to ST tx session.  Pt stated that he was tolerating his current diet without coughing or choking, but he also reported that he had difficulty masticating Dysphagia 3 (soft) solids.  Pt exhibited prolonged mastication, bolus formation, and AP transport with a small trial of regular solids.  Trace oral residue was observed on the pt's lingual surface.  He consumed trials of nectar-thick liquid and honey-thick liquid via straw sip.   A delayed throat clear was observed >3 minutes after pt's last sip of nectar-thick liquid, therefore unable to determine if it was directly related to PO intake.  No additional s/sx of aspiration were observed with any trials and no changes in vitals were noted during PO intake.  Recommend diet change to Dysphagia 2 (fine chop) solids and nectar-thick liquids with strict adherence to compensatory strategies (listed below).  SLP will continue to f/u per POC.    HPI HPI: 74 year old male admitted with COVID/PNA; PMH bladder cancer, OSA, DM, bronchitis, CAD. OT  witnessed pt coughing with 3/4 sips water.      SLP Plan  Continue with current plan of care       Recommendations  Diet recommendations: Dysphagia 2 (fine chop);Nectar-thick liquid Liquids provided via: Cup;Straw Medication Administration: Whole meds with puree Supervision: Staff to assist with self feeding;Full supervision/cueing for compensatory strategies Compensations: Minimize environmental distractions;Slow rate;Small sips/bites;Lingual sweep for clearance of pocketing Postural Changes  and/or Swallow Maneuvers: Seated upright 90 degrees                Oral Care Recommendations: Oral care BID Follow up Recommendations: Other (comment);24 hour supervision/assistance(TBD) SLP Visit Diagnosis: Dysphagia, unspecified (R13.10) Plan: Continue with current plan of care                     Colin Mulders M.S., [place] Office: [phone]  [place] 02/13/2020, 4:32 PM

## 2020-02-13 NOTE — Plan of Care (Signed)
  Problem: Education: Goal: Knowledge of risk factors and measures for prevention of condition will improve Outcome: Progressing   Problem: Coping: Goal: Psychosocial and spiritual needs will be supported Outcome: Progressing   Problem: Respiratory: Goal: Will maintain a patent airway Outcome: Progressing Goal: Complications related to the disease process, condition or treatment will be avoided or minimized Outcome: Progressing   

## 2020-02-13 NOTE — Progress Notes (Signed)
PROGRESS NOTE    Jimmy [name]  [id number] DOB: 1946/08/09 DOA: 02/08/2020 PCP: [name], MD    Brief Narrative:  Patient was admitted to the hospital with a working diagnosis of acute hypoxic respiratory failure due to SARS COVID-19 viral pneumonia. Hospitalization complicated with left lower extremity DVT, metabolic encephalopathy and gi bleed.   74 year old male who presented with dyspnea. He does have significant past medical history for chronic kidney disease, hypertension, bladder cancer and coronary artery disease. Patient reported not feeling well for several days at home, having dyspnea and cough. Home health nurse noticed him to be more confused having increased oxygen requirements. On his initial physical examination in the emergency department he was in respiratory distress and placed on nonrebreather mask,with oxygen saturation 90%, his blood pressure was 129/79, heart rate 102, respiratory rate28.His lungs were clear to auscultation, no wheezing or rhonchi, heart S1-S2, present with, abdomen was soft and nontender, no lower extremity edema.Urinalysis specific gravity 1.012, 0-5 red cells, 11-20 white cells, large leukocytes. Chest radiograph with left lower lobe interstitial infiltrate, increased bilateral markings. CT chest negative for pulmonary embolism, bilateral apical groundglass opacities bilateral subpleural groundglass infiltrates.  Patient has received supplemental oxygen per high flow nasal cannula, medical therapy with remdesivir and dexamethasone. For persistent hypoxic respiratory failure he received SARS COVID-19 convalescent plasma.  He continued to have high oxygen requirements. Ultrasonography lower extremities was positive for deep vein thrombosis on the left.  Patient responded well to diuresis for non cardiogenic pulmonary edema with improvement of oxygenation.  Urothelial cancer: 06/22/2019. SP TURB with bilateralureteral  stentsplacement/final pathology showed high-grade infiltrating papillary adenocarcinoma, invading into the detrusor muscle with lymphovascular invasion. .  08/07/2019 patient underwent neoadjuvant chemotherapy,4 cycles completed October 16, 2019. 12/2019 underwent radical cystectomy and lymph node dissection.   Final pathology showed 1.5 cm residual tumor with invasion into the perivascular soft tissue. Final pathology stage T38 and 0 with 0 out of 10 lymph nodes involved.Currently under active surveillance.  Patient is very weak and deconditioned, poor oral intake. Poorly responsive. Had bloody stools and anticoagulation was held.   Assessment & Plan:   Principal Problem:   Pneumonia due to COVID-19 virus Active Problems:   Hypertension associated with type 2 diabetes mellitus ([place])   Malignant neoplasm of urinary bladder (HCC)   Acute respiratory failure due to COVID-19 ([place])    1. Acute hypoxic respiratory failure due to SARS COVID 19 viral pneumonia.sp remdesivir #5/5.   RR: 18  Pulse oxymetry: 94%  Fi02: 4 L/ min per [place]  COVID-19 Labs  Recent Labs    02/11/20 0347 02/12/20 0142 02/13/20 0040  DDIMER 1.47* 1.71* 1.44*  FERRITIN 46 44 42  CRP 1.4* 1.0* 0.8    Lab Results  Component Value Date   SARSCOV2NAA POSITIVE (A) 02/08/2020   SARSCOV2NAA NEGATIVE 12/23/2019   SARSCOV2NAA NOT DETECTED 12/07/2019   [place] NEGATIVE 06/18/2019    Stable oxygen requirements and inflammatory markers. He continue to be very weak and deconditioned.   On antitussive agents, bronchodilators and airway clearing techniques with flutter valve and incentive spirometer.  Continue physical and occupational therapy    2. Acute left lower extremity DVT/ complicated by NEW bloody stools.mild drop in his Hgb down to  11.6 , Stools are more brown now, but continue to have some blood. If no further drop in Hgb in the next 24 will resume full dose enoxaparin, for now will  resume just low dose. I regards of IVC filter, I  am worried of procedure sedation may make his encephalopathy worse. Best option is to resume anticoagulation if no significant bleeding.   3. Metabolic encephalopathy. Head CT on admission with atrophy and chronic microvascular disease. Continue to encourage po intake and physical therapy. Poor oral intake is a sign of poor prognosis. Continue thiamine and multivitamins.   4. 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 No clear about urinary symptoms,His culture was positive for coagulase negative staph 80, 000 CFS and aerococcus >100, 000 CFU, likely a contamination. Continue to hold on antibiotic therapy.    5. CKD stage 3awith hyperkalemia/ non anion gap metabolic acidosis. Stable renal function with serum cr at 1,43, improved K at 4,4 and serum bicarbonate at 19. Will dc sodium zirconium and will continue to follow closely renal function and electrolytes.   6. T2DM(Hgb A1c 6.0). Fasting glucose this am at160. Continue glucose cover and monitoring with insulin sliding scale. No basal insulin due to risk of hypoglycemia.  DVT prophylaxis:apixaban Code Status:full Family Communication: no family at discharge Disposition Plan/ discharge barriers:patient continue acutely ill, may need SNF when more stable.     Nutrition Status: Nutrition Problem: Increased nutrient needs Etiology: acute illness, catabolic illness(COVID) Signs/Symptoms: estimated needs Interventions: Ensure Enlive (each supplement provides 350kcal and 20 grams of protein), Magic cup, Hormel Shake, MVI    Subjective: Patient continue to be very weak and deconditioned, poor oral intake, denies any nausea or vomiting, no chest pain. Out of bed to chair with assistance. Continue to have bloody bowel movements.   Objective: Vitals:   02/12/20 2000 02/13/20 0300 02/13/20 0400 02/13/20 0716  BP: 133/77  127/82 126/76  Pulse: (!) 102 98 88 (!)  103  Resp: 20 13 15 18   Temp: 98 F (36.7 C)   98.2 F (36.8 C)  TempSrc: Axillary   Axillary  SpO2: 92% 94% 95% 94%  Weight:      Height:        Intake/Output Summary (Last 24 hours) at 02/13/2020 0911 Last data filed at 02/13/2020 0839 Gross per 24 hour  Intake 603 ml  Output 1800 ml  Net -1197 ml   Filed Weights   02/08/20 1450 02/08/20 2230  Weight: 94.5 kg (!) 185.7 kg    Examination:   General: Not in pain or dyspnea, deconditioned and ill looking appearing.  Neurology: Awake and alert, non focal  E ENT: no pallor, no icterus, oral mucosa moist Cardiovascular: No JVD. S1-S2 present, rhythmic, no gallops, rubs, or murmurs. No lower extremity edema. Pulmonary: positive breath sounds bilaterally. Gastrointestinal. Abdomen with no organomegaly, non tender, no rebound or guarding Skin. No rashes Musculoskeletal: no joint deformities     Data Reviewed: I have personally reviewed following labs and imaging studies  CBC: Recent Labs  Lab 02/08/20 1545 02/09/20 0432 02/10/20 0000 02/12/20 1029 02/13/20 0040  WBC 5.3 4.0 4.7 8.2 6.5  NEUTROABS  --  3.5  --   --   --   HGB 12.7* 11.9* 11.6* 13.0 11.6*  HCT 40.4 38.2* 36.8* 41.0 37.4*  MCV 91.6 91.4 90.0 90.9 91.9  PLT 250 235 224 291 [id number]   Basic Metabolic Panel: Recent Labs  Lab 02/09/20 0432 02/10/20 0000 02/11/20 0347 02/12/20 0142 02/13/20 0040  NA 137 142 137 138 140  K 5.2* 5.0 5.3* 5.1 4.4  CL 110 113* 109 110 111  CO2 18* 20* 20* 17* 19*  GLUCOSE 171* 167* 212* 218* 160*  BUN 42* 48* 55* 60* 66*  CREATININE 1.38* 1.36* 1.54* 1.44* 1.43*  CALCIUM 9.6 9.5 9.3 9.4 9.4  MG  --  2.0  --   --   --    GFR: Estimated Creatinine Clearance: 75 mL/min (A) (by C-G formula based on SCr of 1.43 mg/dL (H)). Liver Function Tests: Recent Labs  Lab 02/08/20 1545 02/09/20 0432 02/11/20 0347 02/12/20 0142 02/13/20 0040  AST 41 35 71* 105* 98*  ALT 22 21 40 65* 76*  ALKPHOS 134* 129* 135* 160* 150*   BILITOT 0.7 0.5 0.5 0.8 0.5  PROT 7.6 7.7 7.3 7.5 7.0  ALBUMIN 3.2* 3.1* 2.9* 3.1* 3.1*   No results for input(s): LIPASE, AMYLASE in the last 168 hours. Recent Labs  Lab 02/08/20 1545  AMMONIA 54*   Coagulation Profile: Recent Labs  Lab 02/08/20 1940  INR 1.1   Cardiac Enzymes: No results for input(s): CKTOTAL, CKMB, CKMBINDEX, TROPONINI in the last 168 hours. BNP (last 3 results) No results for input(s): PROBNP in the last 8760 hours. HbA1C: No results for input(s): HGBA1C in the last 72 hours. CBG: Recent Labs  Lab 02/12/20 0825 02/12/20 1120 02/12/20 1653 02/12/20 1954 02/13/20 0719  GLUCAP 179* 199* 268* 242* 218*   Lipid Profile: No results for input(s): CHOL, HDL, LDLCALC, TRIG, CHOLHDL, LDLDIRECT in the last 72 hours. Thyroid Function Tests: No results for input(s): TSH, T4TOTAL, FREET4, T3FREE, THYROIDAB in the last 72 hours. Anemia Panel: Recent Labs    02/12/20 0142 02/13/20 0040  FERRITIN 41 42      Radiology Studies: I have reviewed all of the imaging during this hospital visit personally     Scheduled Meds: . vitamin C  500 mg Oral Daily  . chlorpheniramine-HYDROcodone  5 mL Oral Q12H  . dexamethasone (DECADRON) injection  6 mg Intravenous Q24H  . feeding supplement (ENSURE ENLIVE)  237 mL Oral TID BM  . insulin aspart  0-5 Units Subcutaneous QHS  . insulin aspart  0-9 Units Subcutaneous TID WC  . Ipratropium-Albuterol  1 puff Inhalation Q6H  . multivitamin with minerals  1 tablet Oral Daily  . nystatin  5 mL Oral QID  . pantoprazole (PROTONIX) IV  40 mg Intravenous Q12H  . sodium chloride flush  3 mL Intravenous Once  . sodium chloride flush  3 mL Intravenous Q12H  . sodium zirconium cyclosilicate  10 g Oral TID  . thiamine  100 mg Oral Daily  . zinc sulfate  220 mg Oral Daily   Continuous Infusions: . sodium chloride       LOS: 5 days        Verlene Glantz Gerome Apley, MD

## 2020-02-14 DIAGNOSIS — K922 Gastrointestinal hemorrhage, unspecified: Secondary | ICD-10-CM

## 2020-02-14 LAB — COMPREHENSIVE METABOLIC PANEL
ALT: 72 U/L — ABNORMAL HIGH (ref 0–44)
AST: 73 U/L — ABNORMAL HIGH (ref 15–41)
Albumin: 3 g/dL — ABNORMAL LOW (ref 3.5–5.0)
Alkaline Phosphatase: 139 U/L — ABNORMAL HIGH (ref 38–126)
Anion gap: 10 (ref 5–15)
BUN: 66 mg/dL — ABNORMAL HIGH (ref 8–23)
CO2: 18 mmol/L — ABNORMAL LOW (ref 22–32)
Calcium: 9.3 mg/dL (ref 8.9–10.3)
Chloride: 114 mmol/L — ABNORMAL HIGH (ref 98–111)
Creatinine, Ser: 1.48 mg/dL — ABNORMAL HIGH (ref 0.61–1.24)
GFR calc Af Amer: 54 mL/min — ABNORMAL LOW (ref >60)
GFR calc non Af Amer: 46 mL/min — ABNORMAL LOW (ref >60)
Glucose, Bld: 319 mg/dL — ABNORMAL HIGH (ref 70–99)
Potassium: 4.7 mmol/L (ref 3.5–5.1)
Sodium: 142 mmol/L (ref 135–145)
Total Bilirubin: 0.8 mg/dL (ref 0.3–1.2)
Total Protein: 7.2 g/dL (ref 6.5–8.1)

## 2020-02-14 LAB — HEMOGLOBIN AND HEMATOCRIT, BLOOD
HCT: 38.2 % — ABNORMAL LOW (ref 39.0–52.0)
Hemoglobin: 11.8 g/dL — ABNORMAL LOW (ref 13.0–17.0)

## 2020-02-14 LAB — FERRITIN: Ferritin: 35 ng/mL (ref 24–336)

## 2020-02-14 LAB — GLUCOSE, CAPILLARY
Glucose-Capillary: 209 mg/dL — ABNORMAL HIGH (ref 70–99)
Glucose-Capillary: 281 mg/dL — ABNORMAL HIGH (ref 70–99)
Glucose-Capillary: 184 mg/dL — ABNORMAL HIGH (ref 70–99)
Glucose-Capillary: 313 mg/dL — ABNORMAL HIGH (ref 70–99)

## 2020-02-14 LAB — C-REACTIVE PROTEIN: CRP: 1.1 mg/dL — ABNORMAL HIGH (ref <1.0)

## 2020-02-14 LAB — D-DIMER, QUANTITATIVE: D-Dimer, Quant: 1.36 ug/mL-FEU — ABNORMAL HIGH (ref 0.00–0.50)

## 2020-02-14 NOTE — Plan of Care (Signed)
Pt A&O x2 this morning. RN attempted to update wife via home phone and cell phone. Left voicemail with number to call back. BP elevated, pt tolerating 2L [place]. Small blood tinged BM (smear) noted during bath. Appetite poor. Pt given jello and Ensure. Will continue current POC.  Problem: Coping: Goal: Psychosocial and spiritual needs will be supported Outcome: Progressing   Problem: Respiratory: Goal: Will maintain a patent airway Outcome: Progressing Goal: Complications related to the disease process, condition or treatment will be avoided or minimized Outcome: Progressing   Problem: Education: Goal: Knowledge of risk factors and measures for prevention of condition will improve Outcome: Not Progressing

## 2020-02-14 NOTE — Plan of Care (Signed)
RN updated [name], pt's wife. Pt and wife conversed via phone. Pt stable on 2L [place], VS WNL.

## 2020-02-14 NOTE — Progress Notes (Addendum)
PROGRESS NOTE    Jimmy [name]  [id number] DOB: 1946/05/26 DOA: 02/08/2020 PCP: [name], MD    Brief Narrative:  Patient was admitted to the hospital with a working diagnosis of acute hypoxic respiratory failure due to SARS COVID-19 viral pneumonia.Hospitalization complicated with left lower extremity DVT, metabolic encephalopathy and gi bleed.  74 year old male who presented with dyspnea. He does have significant past medical history for chronic kidney disease, hypertension, bladder cancer and coronary artery disease. Patient reported not feeling well for several days at home, having dyspnea and cough. Home health nurse noticed him to be more confused having increased oxygen requirements. On his initial physical examination in the emergency department he was in respiratory distress and placed on nonrebreather mask,with oxygen saturation 90%, his blood pressure was 129/79, heart rate 102, respiratory rate28.His lungs were clear to auscultation, no wheezing or rhonchi, heart S1-S2, present with, abdomen was soft and nontender, no lower extremity edema.Urinalysis specific gravity 1.012, 0-5 red cells, 11-20 white cells, large leukocytes. Chest radiograph with left lower lobe interstitial infiltrate, increased bilateral markings. CT chest negative for pulmonary embolism, bilateral apical groundglass opacities bilateral subpleural groundglass infiltrates.  Patient has received supplemental oxygen per high flow nasal cannula, medical therapy with remdesivir and dexamethasone. For persistent hypoxic respiratory failure he received SARS COVID-19 convalescent plasma.  He continued to have high oxygen requirements. Ultrasonography lower extremities was positive for deep vein thrombosis on the left.  Patient responded well to diuresis for non cardiogenic pulmonary edema with improvement of oxygenation.  Urothelial cancer: 06/22/2019. SP TURB with bilateralureteral  stentsplacement/final pathology showed high-grade infiltrating papillary adenocarcinoma, invading into the detrusor muscle with lymphovascular invasion. .  08/07/2019 patient underwent neoadjuvant chemotherapy,4 cycles completed October 16, 2019. 12/2019 underwent radical cystectomy and lymph node dissection.   Final pathology showed 1.5 cm residual tumor with invasion into the perivascular soft tissue. Final pathology stage T38 and 0 with 0 out of 10 lymph nodes involved.Currently under active surveillance.  Patient is very weak and deconditioned, poor oral intake. Poorly responsive. Had bloody stools and anticoagulation was held.     Assessment & Plan:   Principal Problem:   Pneumonia due to COVID-19 virus Active Problems:   Hypertension associated with type 2 diabetes mellitus ([place])   Malignant neoplasm of urinary bladder ([facility])   Acute respiratory failure due to COVID-19 ([place])    1. Acute hypoxic respiratory failure due to SARS COVID 19 viral pneumonia.sp remdesivir #5/5.  RR: 17  Pulse oxymetry: 93%  Fi02: 3 L/ min per [place]  COVID-19 Labs  Recent Labs    02/12/20 0142 02/13/20 0040 02/14/20 0050  DDIMER 1.71* 1.44* 1.36*  FERRITIN 44 42 35  CRP 1.0* 0.8 1.1*    Lab Results  Component Value Date   SARSCOV2NAA POSITIVE (A) 02/08/2020   [place] NEGATIVE 12/23/2019   [place] NOT DETECTED 12/07/2019   [place] NEGATIVE 06/18/2019    Continue to have stable oxygen requirements along with inflammatory markers. Patient very weak and deconditioned.   Continue with antitussive agents, bronchodilators and airway clearing techniques with flutter valve and incentive spirometer.  Continue physical and occupational therapy, out of bed to chair tid with meals.    2. Acute left lower extremity DVT/ complicated by NEW bloody stools. Patient had bloody stools last nigh and mid this am, Hgb continue to be stable at 11.8. Will hold on enoxaparin for  today and will observe for any signs of bleeding. If no further bleeding in 24, and stable  cell count will resume anticoagulation with enoxaparin, if recurrent bleeding will need to consider a IVC filter.   3. Lower GI bleeding. Patient with improving bloody stools, will continue close monitoring of Hgb and Hct, possible ischemic colitis. Will continue pantoprazole bid. PO as tolerated, patient very deconditioned.   4. Metabolic encephalopathy.Head CT on admission with atrophy and chronic microvascular disease.Today he seems to be more awake and reactive, continue to be very weak. Continue to encourage po intake. Continue aspiration precautions and dysphagia 2 diet.   5. 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 No clear about urinary symptoms,His culture was positive for coagulase negative staph 80, 000 CFS and aerococcus >100, 000 CFU, likely a contamination.No signs of bacterial infection.    6. CKD stage 3awith hyperkalemia/ non anion gap metabolic acidosis.Renal function with serum cr at 1,48, improved K at 4,7 and serum bicarbonate at 18. Continue to follow renal panel in am, avoid hypotension and nephrotoxic medications.  7. T2DM(Hgb A1c 6.0). Fasting glucose this am at319If persistent elevation will resume basal insulin, for now continue sliding scale insulin for glucose cover and monitoring. Continue to encourage po intake.  DVT prophylaxis:hold for 24 H, since active gi bleeding.  Code Status:full Family Communication: I spoke over the phone with the patient's daughter about patient's  condition, plan of care, prognosis and all questions were addressed. Disposition Plan/ discharge barriers:patient continue acutely ill, may need SNF when more stable.    Nutrition Status: Nutrition Problem: Increased nutrient needs Etiology: acute illness, catabolic illness(COVID) Signs/Symptoms: estimated needs Interventions: Ensure Enlive (each supplement  provides 350kcal and 20 grams of protein), Magic cup, Hormel Shake, MVI    Subjective: Patient had some bloody stools this am and some last night, no nausea or vomiting, and seems to have more effort in po intake. Continue to be very weak and deconditioned.   Objective: Vitals:   02/13/20 2104 02/14/20 0126 02/14/20 0348 02/14/20 0722  BP: 126/84 127/82 (!) 145/84 (!) 146/91  Pulse: (!) 109 (!) 107 (!) 104 95  Resp: 20 17 17 16   Temp:   97.8 F (36.6 C)   TempSrc:   Axillary   SpO2: 93% 93% 93% 93%  Weight:      Height:        Intake/Output Summary (Last 24 hours) at 02/14/2020 0902 Last data filed at 02/14/2020 0600 Gross per 24 hour  Intake 240 ml  Output 1750 ml  Net -1510 ml   Filed Weights   02/08/20 1450 02/08/20 2230  Weight: 94.5 kg (!) 185.7 kg    Examination:   General: deconditioned  Neurology: Awake and alert, non focal  E ENT: mild pallor, no icterus, oral mucosa moist Cardiovascular: No JVD. S1-S2 present, rhythmic, no gallops, rubs, or murmurs. No lower extremity edema. Pulmonary: positive breath sounds bilaterally, poor inspiratory effort. Gastrointestinal. Abdomen with no organomegaly, non tender, no rebound or guarding/ ostomy in place.  Skin. No rashes Musculoskeletal: no joint deformities     Data Reviewed: I have personally reviewed following labs and imaging studies  CBC: Recent Labs  Lab 02/08/20 1545 02/09/20 0432 02/10/20 0000 02/12/20 1029 02/13/20 0040  WBC 5.3 4.0 4.7 8.2 6.5  NEUTROABS  --  3.5  --   --   --   HGB 12.7* 11.9* 11.6* 13.0 11.6*  HCT 40.4 38.2* 36.8* 41.0 37.4*  MCV 91.6 91.4 90.0 90.9 91.9  PLT 250 235 224 291 [id number]   Basic Metabolic Panel: Recent Labs  Lab  02/10/20 0000 02/11/20 0347 02/12/20 0142 02/13/20 0040 02/14/20 0050  NA 142 137 138 140 142  K 5.0 5.3* 5.1 4.4 4.7  CL 113* 109 110 111 114*  CO2 20* 20* 17* 19* 18*  GLUCOSE 167* 212* 218* 160* 319*  BUN 48* 55* 60* 66* 66*  CREATININE 1.36*  1.54* 1.44* 1.43* 1.48*  CALCIUM 9.5 9.3 9.4 9.4 9.3  MG 2.0  --   --   --   --    GFR: Estimated Creatinine Clearance: 72.5 mL/min (A) (by C-G formula based on SCr of 1.48 mg/dL (H)). Liver Function Tests: Recent Labs  Lab 02/09/20 0432 02/11/20 0347 02/12/20 0142 02/13/20 0040 02/14/20 0050  AST 35 71* 105* 98* 73*  ALT 21 40 65* 76* 72*  ALKPHOS 129* 135* 160* 150* 139*  BILITOT 0.5 0.5 0.8 0.5 0.8  PROT 7.7 7.3 7.5 7.0 7.2  ALBUMIN 3.1* 2.9* 3.1* 3.1* 3.0*   No results for input(s): LIPASE, AMYLASE in the last 168 hours. Recent Labs  Lab 02/08/20 1545  AMMONIA 54*   Coagulation Profile: Recent Labs  Lab 02/08/20 1940  INR 1.1   Cardiac Enzymes: No results for input(s): CKTOTAL, CKMB, CKMBINDEX, TROPONINI in the last 168 hours. BNP (last 3 results) No results for input(s): PROBNP in the last 8760 hours. HbA1C: No results for input(s): HGBA1C in the last 72 hours. CBG: Recent Labs  Lab 02/12/20 1954 02/13/20 0719 02/13/20 1127 02/13/20 1627 02/13/20 1941  GLUCAP 242* 218* 204* 220* 251*   Lipid Profile: No results for input(s): CHOL, HDL, LDLCALC, TRIG, CHOLHDL, LDLDIRECT in the last 72 hours. Thyroid Function Tests: No results for input(s): TSH, T4TOTAL, FREET4, T3FREE, THYROIDAB in the last 72 hours. Anemia Panel: Recent Labs    02/13/20 0040 02/14/20 0050  FERRITIN 48 35      Radiology Studies: I have reviewed all of the imaging during this hospital visit personally     Scheduled Meds: . vitamin C  500 mg Oral Daily  . chlorpheniramine-HYDROcodone  5 mL Oral Q12H  . dexamethasone (DECADRON) injection  6 mg Intravenous Q24H  . feeding supplement (ENSURE ENLIVE)  237 mL Oral TID BM  . insulin aspart  0-5 Units Subcutaneous QHS  . insulin aspart  0-9 Units Subcutaneous TID WC  . Ipratropium-Albuterol  1 puff Inhalation Q6H  . multivitamin with minerals  1 tablet Oral Daily  . nystatin  5 mL Oral QID  . pantoprazole (PROTONIX) IV  40 mg  Intravenous Q12H  . sodium chloride flush  3 mL Intravenous Once  . sodium chloride flush  3 mL Intravenous Q12H  . thiamine  100 mg Oral Daily  . zinc sulfate  220 mg Oral Daily   Continuous Infusions: . sodium chloride       LOS: 6 days        Nicco Reaume Gerome Apley, MD

## 2020-02-14 NOTE — Progress Notes (Signed)
Physical Therapy Treatment Patient Details Name: Jimmy [name] MRN: [id number] DOB: 1946-12-07 Today's Date: 02/14/2020    History of Present Illness 74 year old male admitted with COVID/PNA; PMH includes bladder cancer- ileus conduit with urinary diversion. Has OSA but generally does not use his CPAP. Has had multiple surgeries- including for bladder cancer- most recent Dec 2020    PT Comments    He was sleeping in bed when PT arrived, RN in room. He is very frail- shallow breathing, still oxygen dependent. Flat affect. Both LEs are edematous. He was in agreement to perform some ex at bed level, but unable to attempt supine to sit on edge of bed- he required max assist for repositioning in bed to relieve pressure on back and hips. Performed LE ex, A,AAROM, 10 reps each. Practiced pursed lip breathing and IS as well as Flutter Valve unit- he is extremely weak currently. Will continue to follow in PT to work toward optimal functional outcomes.   Follow Up Recommendations  SNF(He is very frail and not really progressing at this time- hindered by his medical complexity.)     Equipment Recommendations  Rolling walker with 5" wheels    Recommendations for Other Services       Precautions / Restrictions Precautions Precaution Comments: Monitor closely related to O2- He presents as confused- slow to respond and complete tasks today. Restrictions Weight Bearing Restrictions: No    Mobility  Bed Mobility Overal bed mobility: Needs Assistance   Rolling: Max assist;Mod assist Sidelying to sit: (could not tolerated any sitting on edge of bed today.)       General bed mobility comments: He was unable to tolerate any supine to sit or OOB activity today- very weak/frail. Both LE are edematous  Transfers                    Ambulation/Gait                 Stairs             Wheelchair Mobility    Modified Rankin (Stroke Patients Only)       Balance    Sitting-balance support: (could not practice any sitting on edge of bed today.)                                        Cognition Arousal/Alertness: Lethargic Behavior During Therapy: Flat affect;Anxious Overall Cognitive Status: Impaired/Different from baseline Area of Impairment: Orientation;Attention;Memory;Following commands;Safety/judgement;Awareness;Problem solving                 Orientation Level: Disoriented to;Place;Time;Situation   Memory: Decreased short-term memory Following Commands: Follows one step commands with increased time Safety/Judgement: Decreased awareness of safety;Decreased awareness of deficits   Problem Solving: Slow processing;Decreased initiation;Difficulty sequencing;Requires verbal cues;Requires tactile cues General Comments: Had a short very emotional phone call on speaker with his wife- he cannot speak above a whisper. Could only particpate at bed level for ex today and required max assistance for all bed mobility      Exercises General Exercises - Lower Extremity Ankle Circles/Pumps: Supine;10 reps;AAROM;AROM Quad Sets: AAROM;AROM;10 reps;Supine Short Arc Quad: AAROM;Supine;10 reps Hip ABduction/ADduction: AAROM;AROM;Supine;10 reps Other Exercises Other Exercises: Reviewed and reinforced in Pursed lip breathing- he is a mouth breather--needed cues to properly perform optimal breathing techniques Other Exercises: Practiced IS and only able to achieve 500 for 8 out of  10 reps Other Exercises: Good return demo for Flutter Valve unit    General Comments        Pertinent Vitals/Pain Pain Assessment: No/denies pain    Home Living                      Prior Function            PT Goals (current goals can now be found in the care plan section) Acute Rehab PT Goals Patient Stated Goal: none stated- except wants to go home PT Goal Formulation: With patient Time For Goal Achievement: 02/28/20 Potential to  Achieve Goals: Fair Progress towards PT goals: Not progressing toward goals - comment(He is very frail, deconditioned, fatigues easily- was limited to bed level activity today)    Frequency    Min 3X/week      PT Plan      Co-evaluation              AM-PAC PT "6 Clicks" Mobility   Outcome Measure  Help needed turning from your back to your side while in a flat bed without using bedrails?: A Lot Help needed moving from lying on your back to sitting on the side of a flat bed without using bedrails?: A Lot Help needed moving to and from a bed to a chair (including a wheelchair)?: A Lot Help needed standing up from a chair using your arms (e.g., wheelchair or bedside chair)?: A Lot Help needed to walk in hospital room?: A Lot Help needed climbing 3-5 steps with a railing? : A Lot 6 Click Score: 12    End of Session Equipment Utilized During Treatment: Oxygen Activity Tolerance: Patient limited by fatigue;Patient limited by lethargy;Treatment limited secondary to medical complications (Comment) Patient left: in bed;with call bell/phone within reach;with bed alarm set         Time: [id number] PT Time Calculation (min) (ACUTE ONLY): 31 min  Charges:  $Therapeutic Exercise: 8-22 mins $Therapeutic Activity: 8-22 mins                    Rollen Sox, PT # [phone] CGV cell  Casandra Doffing 02/14/2020, 5:15 PM

## 2020-02-15 ENCOUNTER — Telehealth: Payer: Self-pay | Admitting: Family Medicine

## 2020-02-15 LAB — CBC WITH DIFFERENTIAL/PLATELET
Abs Immature Granulocytes: 0.05 10*3/uL (ref 0.00–0.07)
Basophils Absolute: 0 10*3/uL (ref 0.0–0.1)
Basophils Relative: 0 %
Eosinophils Absolute: 0 10*3/uL (ref 0.0–0.5)
Eosinophils Relative: 0 %
HCT: 36.9 % — ABNORMAL LOW (ref 39.0–52.0)
Hemoglobin: 11.5 g/dL — ABNORMAL LOW (ref 13.0–17.0)
Immature Granulocytes: 1 %
Lymphocytes Relative: 3 %
Lymphs Abs: 0.2 10*3/uL — ABNORMAL LOW (ref 0.7–4.0)
MCH: 28.3 pg (ref 26.0–34.0)
MCHC: 31.2 g/dL (ref 30.0–36.0)
MCV: 90.9 fL (ref 80.0–100.0)
Monocytes Absolute: 0.5 10*3/uL (ref 0.1–1.0)
Monocytes Relative: 7 %
Neutro Abs: 6.2 10*3/uL (ref 1.7–7.7)
Neutrophils Relative %: 89 %
Platelets: 217 10*3/uL (ref 150–400)
RBC: 4.06 MIL/uL — ABNORMAL LOW (ref 4.22–5.81)
RDW: 15.9 % — ABNORMAL HIGH (ref 11.5–15.5)
WBC: 6.9 10*3/uL (ref 4.0–10.5)
nRBC: 0 % (ref 0.0–0.2)

## 2020-02-15 LAB — GLUCOSE, CAPILLARY
Glucose-Capillary: 231 mg/dL — ABNORMAL HIGH (ref 70–99)
Glucose-Capillary: 207 mg/dL — ABNORMAL HIGH (ref 70–99)
Glucose-Capillary: 198 mg/dL — ABNORMAL HIGH (ref 70–99)
Glucose-Capillary: 325 mg/dL — ABNORMAL HIGH (ref 70–99)

## 2020-02-15 LAB — BASIC METABOLIC PANEL
Anion gap: 10 (ref 5–15)
BUN: 63 mg/dL — ABNORMAL HIGH (ref 8–23)
CO2: 21 mmol/L — ABNORMAL LOW (ref 22–32)
Calcium: 9.4 mg/dL (ref 8.9–10.3)
Chloride: 114 mmol/L — ABNORMAL HIGH (ref 98–111)
Creatinine, Ser: 1.38 mg/dL — ABNORMAL HIGH (ref 0.61–1.24)
GFR calc Af Amer: 58 mL/min — ABNORMAL LOW (ref >60)
GFR calc non Af Amer: 50 mL/min — ABNORMAL LOW (ref >60)
Glucose, Bld: 241 mg/dL — ABNORMAL HIGH (ref 70–99)
Potassium: 5 mmol/L (ref 3.5–5.1)
Sodium: 145 mmol/L (ref 135–145)

## 2020-02-15 MED ORDER — SODIUM ZIRCONIUM CYCLOSILICATE 5 G PO PACK
5.0000 g | PACK | Freq: Three times a day (TID) | ORAL | Status: DC
  Administered 2020-02-15 (×2): 5 g via ORAL
  Filled 2020-02-15 (×4): qty 1

## 2020-02-15 MED ORDER — METOPROLOL TARTRATE 25 MG PO TABS
25.0000 mg | ORAL_TABLET | Freq: Once | ORAL | Status: AC
  Administered 2020-02-15: 23:00:00 25 mg via ORAL
  Filled 2020-02-15: qty 1

## 2020-02-15 MED ORDER — ENOXAPARIN SODIUM 300 MG/3ML IJ SOLN: 1.0000 mg/kg | Freq: Two times a day (BID) | INTRAMUSCULAR | Status: DC

## 2020-02-15 MED ORDER — ENOXAPARIN SODIUM 100 MG/ML ~~LOC~~ SOLN
85.0000 mg | Freq: Two times a day (BID) | SUBCUTANEOUS | Status: DC
  Administered 2020-02-15: 12:00:00 85 mg via SUBCUTANEOUS
  Filled 2020-02-15 (×2): qty 1

## 2020-02-15 MED ORDER — INSULIN DETEMIR 100 UNIT/ML ~~LOC~~ SOLN
5.0000 [IU] | Freq: Every day | SUBCUTANEOUS | Status: DC
  Administered 2020-02-15 – 2020-02-16 (×2): 5 [IU] via SUBCUTANEOUS
  Filled 2020-02-15 (×2): qty 0.05

## 2020-02-15 NOTE — Progress Notes (Signed)
[place] for Lovenox Indication: DVT  Allergies  Allergen Reactions  . Ace Inhibitors Cough  . Cephalexin Hives, Diarrhea and Nausea Only    Tolerates Ancef, Rocephin, Cefoxitin  . Crestor [Rosuvastatin Calcium]   . Lipitor [Atorvastatin Calcium] Other (See Comments)    MUSCLE PAINS   . Plavix [Clopidogrel Bisulfate] Hives  . Vancomycin Nausea And Vomiting    Po route caused n/v.     Patient Measurements: Height: 5\' 8"  (172.7 cm) Weight: 188 lb 11.4 oz (85.6 kg) IBW/kg (Calculated) : 68.4 Heparin Dosing Weight: 85.6 kg  Vital Signs: Temp: 98 F (36.7 C) (02/15 0722) Temp Source: Oral (02/15 0722) BP: 155/92 (02/15 0722) Pulse Rate: 99 (02/15 0722)  Labs: Recent Labs    02/13/20 0040 02/13/20 0040 02/14/20 0050 02/14/20 0900 02/15/20 0304  HGB 11.6*   < >  --  11.8* 11.5*  HCT 37.4*  --   --  38.2* 36.9*  PLT 235  --   --   --  217  CREATININE 1.43*  --  1.48*  --  1.38*   < > = values in this interval not displayed.    Estimated Creatinine Clearance: 50.8 mL/min (A) (by C-G formula based on SCr of 1.38 mg/dL (H)).   Medical History: Past Medical History:  Diagnosis Date  . Astigmatism of both eyes 11/01/2016  . Bronchitis   . Coronary artery disease   . Cortical age-related cataract of both eyes 11/01/2016  . Diabetes mellitus without complication ([place])    type 2  . Drug-induced osteoporosis   . Dyslipidemia   . Fatigue   . Fatty liver   . History of kidney stones   . Hypercholesteremia   . Hypogonadism male   . 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 Normal nuclear stress test March 2011  . Nuclear sclerotic cataract of both eyes 11/01/2016  . Obesity   . Prostate cancer [facility])    s/p seed implant and radiation  . Sleep apnea    90% of time dosent wear his cpap per pt  . Vitamin D insufficiency     Medications:   Scheduled:  . vitamin C  500 mg Oral Daily  . chlorpheniramine-HYDROcodone  5 mL Oral Q12H  . dexamethasone (DECADRON) injection  6 mg Intravenous Q24H  . enoxaparin (LOVENOX) injection  85 mg Subcutaneous Q12H  . feeding supplement (ENSURE ENLIVE)  237 mL Oral TID BM  . insulin aspart  0-5 Units Subcutaneous QHS  . insulin aspart  0-9 Units Subcutaneous TID WC  . Ipratropium-Albuterol  1 puff Inhalation Q6H  . multivitamin with minerals  1 tablet Oral Daily  . nystatin  5 mL Oral QID  . pantoprazole (PROTONIX) IV  40 mg Intravenous Q12H  . sodium chloride flush  3 mL Intravenous Once  . sodium chloride flush  3 mL Intravenous Q12H  . thiamine  100 mg Oral Daily  . zinc sulfate  220 mg Oral Daily    Assessment: 74 y/o M admitted with COVID-19 PNA developed LLE DVT complicated by bloody stools. Eliquis stopped 2/11, DVT prophylaxis Lovenox given 2/13.   CBC stable Bloody stools improving on PPI SCr improved, stable; UOP OK  Goal of Therapy:  Anti-Xa level 0.6-1 units/ml 4hrs after LMWH dose given Monitor platelets by anticoagulation protocol: Yes   Plan:  -Lovenox 85 mg (1 mg/kg)  q 12 hours -Monitor renal function, CBC, s/s of bleeding -F/U plans for long-term anticoagulation  Napoleon Form 02/15/2020,12:13 PM

## 2020-02-15 NOTE — Telephone Encounter (Signed)
Wife called and lmtrc.  I call [name] today, no answer, lm on ans machine.

## 2020-02-15 NOTE — NC FL2 (Signed)
[place] LEVEL OF CARE SCREENING TOOL     IDENTIFICATION  Patient Name: Jimmy [name] Birthdate: 07/12/1946 Sex: male Admission Date (Current Location): 02/08/2020  [facility] and [place] Number:  Herbalist and Address:  ([place])      Provider Number: [id number]  Attending Physician Name and Address:  Tawni Millers,*  Relative Name and Phone Number:  Zadien, Isensee [phone]  [phone]    Current Level of Care: Hospital Recommended Level of Care: [place] Prior Approval Number:    Date Approved/Denied:   PASRR Number: [id number]  Discharge Plan: SNF    Current Diagnoses: Patient Active Problem List   Diagnosis Date Noted  . Pneumonia due to COVID-19 virus 02/08/2020  . Acute respiratory failure due to COVID-19 ([place]) 02/08/2020  . MSSA bacteremia 01/26/2020  . Bladder cancer ([place]) 12/10/2019  . Port-A-Cath in place 08/07/2019  . Goals of care, counseling/discussion 07/24/2019  . Malignant neoplasm of urinary bladder ([place]) 07/24/2019  . Senile purpura ([place]) 06/03/2019  . Solitary pulmonary nodule present on computed tomography of lung 04/02/2018  . Type II diabetes mellitus with complication ([place]) [id number]  . Osteoporosis due to androgen therapy 09/16/2012  . Coronary artery disease involving native coronary artery of native heart without angina pectoris 08/29/2011  . Morbid obesity ([place]) 08/29/2011  . History of prostate cancer 08/29/2011  . Sleep apnea 08/29/2011  . Hypertension associated with type 2 diabetes mellitus ([place]) 08/29/2011  . Hyperlipidemia associated with type 2 diabetes mellitus ([place]) 08/29/2011    Orientation RESPIRATION BLADDER Height & Weight     Self  O2(3L) Incontinent Weight: 188 lb 11.4 oz (85.6 kg) Height:  5\' 8"  (172.7 cm)  BEHAVIORAL SYMPTOMS/MOOD NEUROLOGICAL BOWEL NUTRITION STATUS      Continent Diet(Cardiac)  AMBULATORY STATUS COMMUNICATION OF  NEEDS Skin   Limited Assist Verbally Surgical wounds                       Personal Care Assistance Level of Assistance  Bathing, Feeding, Dressing Bathing Assistance: Limited assistance Feeding assistance: Limited assistance Dressing Assistance: Limited assistance     Functional Limitations Info  Sight, Speech, Hearing Sight Info: Adequate Hearing Info: Adequate Speech Info: Adequate    SPECIAL CARE FACTORS FREQUENCY  PT (By licensed PT), OT (By licensed OT)     PT Frequency: Minimum 5x a week OT Frequency: Minimum 5x a week            Contractures Contractures Info: Not present    Additional Factors Info  Code Status, Allergies, Insulin Sliding Scale, Isolation Precautions Code Status Info: Full Code Allergies Info: Ace Inhibitors Cephalexin Crestor Lipitor Plavix Vancomycin   Insulin Sliding Scale Info: insulin aspart (novoLOG) injection 0-9 Units 3x a day with meals Isolation Precautions Info: Air/Con precautions due to Covid     Current Medications (02/15/2020):  This is the current hospital active medication list Current Facility-Administered Medications  Medication Dose Route Frequency Provider Last Rate Last Admin  . 0.9 %  sodium chloride infusion  250 mL Intravenous PRN Phillips Grout, MD      . acetaminophen (TYLENOL) tablet 650 mg  650 mg Oral Q6H PRN Derrill Kay A, MD      . albuterol (VENTOLIN HFA) 108 (90 Base) MCG/ACT inhaler 2 puff  2 puff Inhalation Q4H PRN Arrien, Jimmy Picket, MD   2 puff at 02/12/20 2128  . ascorbic acid (VITAMIN C) tablet 500 mg  500 mg Oral Daily Derrill Kay A, MD   500 mg at 02/15/20 0930  . chlorpheniramine-HYDROcodone (TUSSIONEX) 10-8 MG/5ML suspension 5 mL  5 mL Oral Q12H Arrien, Jimmy Picket, MD   5 mL at 02/15/20 0930  . dexamethasone (DECADRON) injection 6 mg  6 mg Intravenous Q24H Derrill Kay A, MD   6 mg at 02/14/20 2045  . feeding supplement (ENSURE ENLIVE) (ENSURE ENLIVE) liquid 237 mL  237 mL Oral TID  BM Arrien, Jimmy Picket, MD   237 mL at 02/15/20 0934  . guaiFENesin-dextromethorphan (ROBITUSSIN DM) 100-10 MG/5ML syrup 10 mL  10 mL Oral Q4H PRN Derrill Kay A, MD      . insulin aspart (novoLOG) injection 0-5 Units  0-5 Units Subcutaneous QHS Phillips Grout, MD   4 Units at 02/14/20 2044  . insulin aspart (novoLOG) injection 0-9 Units  0-9 Units Subcutaneous TID WC Phillips Grout, MD   3 Units at 02/15/20 0724  . Ipratropium-Albuterol (COMBIVENT) respimat 1 puff  1 puff Inhalation Q6H Amin, Ankit Chirag, MD   1 puff at 02/15/20 0723  . multivitamin with minerals tablet 1 tablet  1 tablet Oral Daily Arrien, Jimmy Picket, MD   1 tablet at 02/15/20 0930  . nystatin (MYCOSTATIN) 100000 UNIT/ML suspension 500,000 Units  5 mL Oral QID Tawni Millers, MD   500,000 Units at 02/15/20 0930  . ondansetron (ZOFRAN) tablet 4 mg  4 mg Oral Q6H PRN Phillips Grout, MD       Or  . ondansetron (ZOFRAN) injection 4 mg  4 mg Intravenous Q6H PRN Derrill Kay A, MD      . pantoprazole (PROTONIX) injection 40 mg  40 mg Intravenous Q12H Arrien, Jimmy Picket, MD   40 mg at 02/15/20 0930  . polyethylene glycol (MIRALAX / GLYCOLAX) packet 17 g  17 g Oral Daily PRN Amin, Jeanella Flattery, MD      . Resource ThickenUp Clear   Oral PRN Arrien, Jimmy Picket, MD      . senna-docusate (Senokot-S) tablet 2 tablet  2 tablet Oral QHS PRN Amin, Ankit Chirag, MD      . sodium chloride flush (NS) 0.9 % injection 3 mL  3 mL Intravenous Once Tegeler, Gwenyth Allegra, MD      . sodium chloride flush (NS) 0.9 % injection 3 mL  3 mL Intravenous Q12H Derrill Kay A, MD   3 mL at 02/15/20 0930  . sodium chloride flush (NS) 0.9 % injection 3 mL  3 mL Intravenous PRN Derrill Kay A, MD      . thiamine tablet 100 mg  100 mg Oral Daily Arrien, Jimmy Picket, MD   100 mg at 02/15/20 0930  . zinc sulfate capsule 220 mg  220 mg Oral Daily Derrill Kay A, MD   220 mg at 02/15/20 0930     Discharge Medications: Please  see discharge summary for a list of discharge medications.  Relevant Imaging Results:  Relevant Lab Results:   Additional Information SSN [id number]  Ross Ludwig, LCSW

## 2020-02-15 NOTE — Plan of Care (Signed)
RN was unable to reach wife, Jimmy [name], via cell phone or house phone. A&O x1. Pt stable. HR up to 130s with repositioning. MD aware. Plan to resume Lovenox, Hgb 11.5. Red streaks noted in BM overnight and MD aware. Will continue POC and observe for S&S of GI bleed. Weight updated in chart.   Problem: Coping: Goal: Psychosocial and spiritual needs will be supported Outcome: Progressing   Problem: Respiratory: Goal: Will maintain a patent airway Outcome: Progressing Goal: Complications related to the disease process, condition or treatment will be avoided or minimized Outcome: Progressing   Problem: Education: Goal: Knowledge of risk factors and measures for prevention of condition will improve Outcome: Not Progressing

## 2020-02-15 NOTE — Progress Notes (Signed)
Inpatient Diabetes Program Recommendations  AACE/ADA: New Consensus Statement on Inpatient Glycemic Control (2015)  Target Ranges:  Prepandial:   less than 140 mg/dL      Peak postprandial:   less than 180 mg/dL (1-2 hours)      Critically ill patients:  140 - 180 mg/dL   Lab Results  Component Value Date   GLUCAP 207 (H) 02/15/2020   HGBA1C 6.0 (H) 02/09/2020    Review of Glycemic Control  Diabetes history: DM2 Outpatient Diabetes medications: Victoza 1.8 mg QD Current orders for Inpatient glycemic control: Levemir 10 units QD, Novolog 0-9 units tidwc and 0-5 units QHS  HgbA1C - 6%. Good control prior to admission  Inpatient Diabetes Program Recommendations:     Add Novolog 3 units tidwc for meal coverage insulin if pt eats > 50% meal, while on steroids.  Will continue to follow.  Thank you. Lorenda Peck, RD, LDN, CDE Inpatient Diabetes Coordinator [phone]

## 2020-02-15 NOTE — TOC Initial Note (Signed)
Transition of Care [facility]) - Initial/Assessment Note    Patient Details  Name: Jimmy [name] MRN: [id number] Date of Birth: 06/11/1946  Transition of Care [facility]) CM/SW Contact:    Ross Ludwig, LCSW Phone Number: 02/15/2020, 6:05 PM  Clinical Narrative:                  Patient is a 74 year old male who is alert and oriented x1.  Patient has not been to SNF before, CSW discussed with patient's wife, and his daughter PT recommendation for SNF.  CSW discussed with family what is involved with patient going to SNF and what to expect.  Patient's family was explained how insurance pays for stay at [facility].  CSW discussed the process for finding placement.  CSW began bed search and then presented bed offers.  [name] was crying while on the phone and requested that CSW speak to patient's daughter to discuss choices.  CSW presented bed offers, and patient's daughter stated she will review options and notify CSW about choice.  Expected Discharge Plan: Skilled Nursing Facility Barriers to Discharge: Insurance Authorization, Continued Medical Work up   Patient Goals and CMS Choice Patient states their goals for this hospitalization and ongoing recovery are:: To go to SNF for short term rehab per patient's daughter and wife. CMS Medicare.gov Compare Post Acute Care list provided to:: Patient Represenative (must comment) Choice offered to / list presented to : Spouse, Adult Children  Expected Discharge Plan and Services Expected Discharge Plan: [place] In-house Referral: Clinical Social Work   Post Acute Care Choice: [place] Living arrangements for the past 2 months: [place]                                      Prior Living Arrangements/Services Living arrangements for the past 2 months: Single Family Home Lives with:: Spouse Patient language and need for interpreter reviewed:: Yes Do you feel safe going back to the place where you  live?: No   Patient's family feel he needs short term rehab before he is able to return back home.  Need for Family Participation in Patient Care: Yes (Comment) Care giver support system in place?: No (comment) Current home services: Home PT, Home RN Criminal Activity/Legal Involvement Pertinent to Current Situation/Hospitalization: No - Comment as needed  Activities of Daily Living Home Assistive Devices/Equipment: Walker (specify type), Cane (specify quad or straight) ADL Screening (condition at time of admission) Patient's cognitive ability adequate to safely complete daily activities?: Yes Is the patient deaf or have difficulty hearing?: No Does the patient have difficulty seeing, even when wearing glasses/contacts?: No Does the patient have difficulty concentrating, remembering, or making decisions?: No Patient able to express need for assistance with ADLs?: Yes Does the patient have difficulty dressing or bathing?: No Independently performs ADLs?: No Communication: Independent Does the patient have difficulty walking or climbing stairs?: Yes Weakness of Legs: Both Weakness of Arms/Hands: None  Permission Sought/Granted Permission sought to share information with : Facility Sport and exercise psychologist, Family Supports Permission granted to share information with : Yes, Verbal [place] with NAMENauman, [place] [phone]  [phone] or [place] Daughter [phone]  [phone]  Permission granted to share info w AGENCY: SNF admissions        Emotional Assessment Appearance:: Appears stated age   Affect (typically observed): Calm, Appropriate, Accepting, Stable Orientation: :  Oriented to Self Alcohol / Substance Use: Not Applicable Psych Involvement: No (comment)  Admission diagnosis:  Shortness of breath [R06.02] Somnolence [R40.0] Hypoxia [R09.02] Acute deep vein thrombosis (DVT) of right lower extremity, unspecified vein  (HCC) [I82.401] Acute hypoxemic respiratory failure due to COVID-19 (HCC) [U07.1, J96.01] Pneumonia due to COVID-19 virus [U07.1, J12.82] Patient Active Problem List   Diagnosis Date Noted  . Pneumonia due to COVID-19 virus 02/08/2020  . Acute respiratory failure due to COVID-19 ([place]) 02/08/2020  . MSSA bacteremia 01/26/2020  . Bladder cancer ([place]) 12/10/2019  . Port-A-Cath in place 08/07/2019  . Goals of care, counseling/discussion 07/24/2019  . Malignant neoplasm of urinary bladder ([place]) 07/24/2019  . Senile purpura ([place]) 06/03/2019  . Solitary pulmonary nodule present on computed tomography of lung 04/02/2018  . Type II diabetes mellitus with complication ([place]) Q000111Q  . Osteoporosis due to androgen therapy 09/16/2012  . Coronary artery disease involving native coronary artery of native heart without angina pectoris 08/29/2011  . Morbid obesity ([place]) 08/29/2011  . History of prostate cancer 08/29/2011  . Sleep apnea 08/29/2011  . Hypertension associated with type 2 diabetes mellitus ([place]) 08/29/2011  . Hyperlipidemia associated with type 2 diabetes mellitus ([place]) 08/29/2011   PCP:  [name], MD Pharmacy:   [id number] - [place], [place] [place] [address] Phone: [phone] Fax: [phone]     Social Determinants of Health (SDOH) Interventions    Readmission Risk Interventions No flowsheet data found.

## 2020-02-15 NOTE — Progress Notes (Signed)
PT Cancellation Note  Patient Details Name: Jimmy [name] MRN: [id number] DOB: 1946/06/04   Cancelled Treatment:    Reason Eval/Treat Not Completed: Other (comment)(Not available x 2 for PT session- will reschedule and continue to monitor)  Rollen Sox, PT # [phone] CGV cell  Casandra Doffing 02/15/2020, 5:01 PM

## 2020-02-15 NOTE — Progress Notes (Signed)
PROGRESS NOTE    Jimmy [name]  [id number] DOB: 18-Oct-1946 DOA: 02/08/2020 PCP: [name], MD    Brief Narrative:  Patient was admitted to the hospital with a working diagnosis of acute hypoxic respiratory failure due to SARS COVID-19 viral pneumonia.Hospitalization complicated with left lower extremity DVT, metabolic encephalopathy and gi bleed.  74 year old male who presented with dyspnea. He does have significant past medical history for chronic kidney disease, hypertension, bladder cancer and coronary artery disease. Patient reported not feeling well for several days at home, having dyspnea and cough. Home health nurse noticed him to be more confused having increased oxygen requirements. On his initial physical examination in the emergency department he was in respiratory distress and placed on nonrebreather mask,with oxygen saturation 90%, his blood pressure was 129/79, heart rate 102, respiratory rate28.His lungs were clear to auscultation, no wheezing or rhonchi, heart S1-S2, present with, abdomen was soft and nontender, no lower extremity edema.Urinalysis specific gravity 1.012, 0-5 red cells, 11-20 white cells, large leukocytes. Chest radiograph with left lower lobe interstitial infiltrate, increased bilateral markings. CT chest negative for pulmonary embolism, bilateral apical groundglass opacities bilateral subpleural groundglass infiltrates.  Patient has received supplemental oxygen per high flow nasal cannula, medical therapy with remdesivir and dexamethasone. For persistent hypoxic respiratory failure he received SARS COVID-19 convalescent plasma.  He continued to have high oxygen requirements. Ultrasonography lower extremities was positive for deep vein thrombosis on the left.  Patient responded well to diuresis for non cardiogenic pulmonary edema with improvement of oxygenation.  Urothelial cancer: 06/22/2019. SP TURB with bilateralureteral  stentsplacement/final pathology showed high-grade infiltrating papillary adenocarcinoma, invading into the detrusor muscle with lymphovascular invasion. .  08/07/2019 patient underwent neoadjuvant chemotherapy,4 cycles completed October 16, 2019. 12/2019 underwent radical cystectomy and lymph node dissection.   Final pathology showed 1.5 cm residual tumor with invasion into the perivascular soft tissue. Final pathology stage T38 and 0 with 0 out of 10 lymph nodes involved.Currently under active surveillance.  Patient is very weak and deconditioned, poor oral intake. Poorly responsive. Had bloody stools and anticoagulation was held for 24 H.   Will resume enoxaparin, if persistent bleeding may need IVC filter.    Assessment & Plan:   Principal Problem:   Pneumonia due to COVID-19 virus Active Problems:   Hypertension associated with type 2 diabetes mellitus ([place])   Malignant neoplasm of urinary bladder (HCC)   Acute respiratory failure due to COVID-19 ([place])     1. Acute hypoxic respiratory failure due to SARS COVID 19 viral pneumonia.sp remdesivir #5/5.Dexamethasone #5/5.   RR: 19  Pulse oxymetry: 90%  Fi02: 3 L/ min per [place]  Oxygen requirements have been stable, will continue withantitussive agents, bronchodilators and airway clearing techniques with flutter valve and incentive spirometer.  Patient very weak and deconditioned, will need SNF. Poor oral intake.   2. Acute left lower extremity DVT/ complicated by NEW bloody stools. No significant bleeding and Hgb has been stable will resume full anticoagulation today with enoxaparin. If persistent bleeding, will consult IR for IVC filter.   3. Lower GI bleeding/ suspected ischemic colitis covid related. Will continue close monitoring of Hgb and Hct, no evident abdominal pain or diarrhea. Will continue nutritional support.   4. Metabolic encephalopathy.Head CT on admission with atrophy and chronic microvascular  disease.Continue to be very weak and decondtioned. On aspiration precautions and dysphagia 2 diet.   5. 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 No clear about urinary symptoms,His culture was positive  for coagulase negative staph 80, 000 CFS and aerococcus >100, 000 CFU, likely a contamination.No signs of bacterial infection.   6. CKD stage 3awith hyperkalemia/ non anion gap metabolic acidosis. Stable renal function with serum cr at 1,38, K at 5,0 and serum bicarbonate at 21. Will resume sodium zirconium at 5 mg tid and follow up renal function in am.   7. T2DM(Hgb A1c 6.0). Fasting glucose is 241, with capillary 207, and 231. Will resume low dose basal insulin and continue sliding scale for glucose cover and monitoring.   DVT prophylaxis:hold for 24 H, since active gi bleeding.  Code Status:full Family Communication:no family at the bedside Disposition Plan/ discharge barriers:patient continue acutely ill, may need SNF when more stable.   Nutrition Status: Nutrition Problem: Increased nutrient needs Etiology: acute illness, catabolic illness(COVID) Signs/Symptoms: estimated needs Interventions: Ensure Enlive (each supplement provides 350kcal and 20 grams of protein), Magic cup, Hormel Shake, MVI    Subjective: Patient continue to have very poor oral intake and generalized weakness, had very small blood in his stools. No abdominal pain, no nausea or vomiting. Has been working with physical therapy.   Objective: Vitals:   02/15/20 0454 02/15/20 0533 02/15/20 0722 02/15/20 1032  BP: (!) 153/87  (!) 155/92   Pulse: 99  99   Resp: 16  19   Temp:  98 F (36.7 C) 98 F (36.7 C)   TempSrc:  Oral Oral   SpO2: 90%  90%   Weight:    85.6 kg  Height:        Intake/Output Summary (Last 24 hours) at 02/15/2020 1126 Last data filed at 02/15/2020 0940 Gross per 24 hour  Intake 1440 ml  Output 2225 ml  Net -785 ml   Filed Weights   02/08/20 1450  02/08/20 2230 02/15/20 1032  Weight: 94.5 kg (!) 185.7 kg 85.6 kg    Examination:   General: Not in pain or dyspnea, deconditioned  Neurology: Awake, response with yes and no to simple questions and follows to simple commands.  E ENT: no pallor, no icterus, oral mucosa moist Cardiovascular: No JVD. S1-S2 present, rhythmic, no gallops, rubs, or murmurs. No lower extremity edema. Pulmonary: positive breath sounds bilaterally, adequate air movement, no wheezing, rhonchi or rales. Gastrointestinal. Abdomen with no organomegaly, non tender, no rebound or guarding. Stoma in place. Abdominal wound clean, dressing in place.  Skin. No rashes Musculoskeletal: no joint deformities     Data Reviewed: I have personally reviewed following labs and imaging studies  CBC: Recent Labs  Lab 02/09/20 0432 02/09/20 0432 02/10/20 0000 02/12/20 1029 02/13/20 0040 02/14/20 0900 02/15/20 0304  WBC 4.0  --  4.7 8.2 6.5  --  6.9  NEUTROABS 3.5  --   --   --   --   --  6.2  HGB 11.9*   < > 11.6* 13.0 11.6* 11.8* 11.5*  HCT 38.2*   < > 36.8* 41.0 37.4* 38.2* 36.9*  MCV 91.4  --  90.0 90.9 91.9  --  90.9  PLT 235  --  224 291 235  --  217   < > = values in this interval not displayed.   Basic Metabolic Panel: Recent Labs  Lab 02/10/20 0000 02/10/20 0000 02/11/20 0347 02/12/20 0142 02/13/20 0040 02/14/20 0050 02/15/20 0304  NA 142   < > 137 138 140 142 145  K 5.0   < > 5.3* 5.1 4.4 4.7 5.0  CL 113*   < > 109 110 111  114* 114*  CO2 20*   < > 20* 17* 19* 18* 21*  GLUCOSE 167*   < > 212* 218* 160* 319* 241*  BUN 48*   < > 55* 60* 66* 66* 63*  CREATININE 1.36*   < > 1.54* 1.44* 1.43* 1.48* 1.38*  CALCIUM 9.5   < > 9.3 9.4 9.4 9.3 9.4  MG 2.0  --   --   --   --   --   --    < > = values in this interval not displayed.   GFR: Estimated Creatinine Clearance: 50.8 mL/min (A) (by C-G formula based on SCr of 1.38 mg/dL (H)). Liver Function Tests: Recent Labs  Lab 02/09/20 0432 02/11/20 0347  02/12/20 0142 02/13/20 0040 02/14/20 0050  AST 35 71* 105* 98* 73*  ALT 21 40 65* 76* 72*  ALKPHOS 129* 135* 160* 150* 139*  BILITOT 0.5 0.5 0.8 0.5 0.8  PROT 7.7 7.3 7.5 7.0 7.2  ALBUMIN 3.1* 2.9* 3.1* 3.1* 3.0*   No results for input(s): LIPASE, AMYLASE in the last 168 hours. Recent Labs  Lab 02/08/20 1545  AMMONIA 54*   Coagulation Profile: Recent Labs  Lab 02/08/20 1940  INR 1.1   Cardiac Enzymes: No results for input(s): CKTOTAL, CKMB, CKMBINDEX, TROPONINI in the last 168 hours. BNP (last 3 results) No results for input(s): PROBNP in the last 8760 hours. HbA1C: No results for input(s): HGBA1C in the last 72 hours. CBG: Recent Labs  Lab 02/14/20 0721 02/14/20 1125 02/14/20 1646 02/14/20 2043 02/15/20 0719  GLUCAP 209* 281* 184* 313* 231*   Lipid Profile: No results for input(s): CHOL, HDL, LDLCALC, TRIG, CHOLHDL, LDLDIRECT in the last 72 hours. Thyroid Function Tests: No results for input(s): TSH, T4TOTAL, FREET4, T3FREE, THYROIDAB in the last 72 hours. Anemia Panel: Recent Labs    02/13/20 0040 02/14/20 0050  FERRITIN 32 35      Radiology Studies: I have reviewed all of the imaging during this hospital visit personally     Scheduled Meds: . vitamin C  500 mg Oral Daily  . chlorpheniramine-HYDROcodone  5 mL Oral Q12H  . dexamethasone (DECADRON) injection  6 mg Intravenous Q24H  . enoxaparin (LOVENOX) injection  85 mg Subcutaneous Q12H  . feeding supplement (ENSURE ENLIVE)  237 mL Oral TID BM  . insulin aspart  0-5 Units Subcutaneous QHS  . insulin aspart  0-9 Units Subcutaneous TID WC  . Ipratropium-Albuterol  1 puff Inhalation Q6H  . multivitamin with minerals  1 tablet Oral Daily  . nystatin  5 mL Oral QID  . pantoprazole (PROTONIX) IV  40 mg Intravenous Q12H  . sodium chloride flush  3 mL Intravenous Once  . sodium chloride flush  3 mL Intravenous Q12H  . thiamine  100 mg Oral Daily  . zinc sulfate  220 mg Oral Daily   Continuous  Infusions: . sodium chloride       LOS: 7 days        Eriko Economos Gerome Apley, MD

## 2020-02-16 ENCOUNTER — Inpatient Hospital Stay (HOSPITAL_COMMUNITY): Payer: Medicare PPO

## 2020-02-16 LAB — BASIC METABOLIC PANEL
Anion gap: 11 (ref 5–15)
BUN: 66 mg/dL — ABNORMAL HIGH (ref 8–23)
CO2: 21 mmol/L — ABNORMAL LOW (ref 22–32)
Calcium: 9.5 mg/dL (ref 8.9–10.3)
Chloride: 113 mmol/L — ABNORMAL HIGH (ref 98–111)
Creatinine, Ser: 1.53 mg/dL — ABNORMAL HIGH (ref 0.61–1.24)
GFR calc Af Amer: 52 mL/min — ABNORMAL LOW (ref >60)
GFR calc non Af Amer: 44 mL/min — ABNORMAL LOW (ref >60)
Glucose, Bld: 356 mg/dL — ABNORMAL HIGH (ref 70–99)
Potassium: 5.1 mmol/L (ref 3.5–5.1)
Sodium: 145 mmol/L (ref 135–145)

## 2020-02-16 LAB — CBC WITH DIFFERENTIAL/PLATELET
Abs Immature Granulocytes: 0.09 10*3/uL — ABNORMAL HIGH (ref 0.00–0.07)
Basophils Absolute: 0 10*3/uL (ref 0.0–0.1)
Basophils Relative: 0 %
Eosinophils Absolute: 0 10*3/uL (ref 0.0–0.5)
Eosinophils Relative: 0 %
HCT: 39.1 % (ref 39.0–52.0)
Hemoglobin: 12.2 g/dL — ABNORMAL LOW (ref 13.0–17.0)
Immature Granulocytes: 1 %
Lymphocytes Relative: 2 %
Lymphs Abs: 0.2 10*3/uL — ABNORMAL LOW (ref 0.7–4.0)
MCH: 28.4 pg (ref 26.0–34.0)
MCHC: 31.2 g/dL (ref 30.0–36.0)
MCV: 90.9 fL (ref 80.0–100.0)
Monocytes Absolute: 0.5 10*3/uL (ref 0.1–1.0)
Monocytes Relative: 6 %
Neutro Abs: 8.2 10*3/uL — ABNORMAL HIGH (ref 1.7–7.7)
Neutrophils Relative %: 91 %
Platelets: 230 10*3/uL (ref 150–400)
RBC: 4.3 MIL/uL (ref 4.22–5.81)
RDW: 16.2 % — ABNORMAL HIGH (ref 11.5–15.5)
WBC: 9 10*3/uL (ref 4.0–10.5)
nRBC: 0 % (ref 0.0–0.2)

## 2020-02-16 LAB — GLUCOSE, CAPILLARY
Glucose-Capillary: 267 mg/dL — ABNORMAL HIGH (ref 70–99)
Glucose-Capillary: 281 mg/dL — ABNORMAL HIGH (ref 70–99)
Glucose-Capillary: 367 mg/dL — ABNORMAL HIGH (ref 70–99)
Glucose-Capillary: 200 mg/dL — ABNORMAL HIGH (ref 70–99)
Glucose-Capillary: 323 mg/dL — ABNORMAL HIGH (ref 70–99)

## 2020-02-16 LAB — MAGNESIUM: Magnesium: 2.4 mg/dL (ref 1.7–2.4)

## 2020-02-16 MED ORDER — SODIUM CHLORIDE 0.45 % IV SOLN: INTRAVENOUS | Status: DC

## 2020-02-16 MED ORDER — SODIUM ZIRCONIUM CYCLOSILICATE 10 G PO PACK
10.0000 g | PACK | Freq: Three times a day (TID) | ORAL | Status: DC
  Administered 2020-02-16 – 2020-02-20 (×13): 10 g via ORAL
  Filled 2020-02-16 (×15): qty 1

## 2020-02-16 MED ORDER — ENOXAPARIN SODIUM 100 MG/ML ~~LOC~~ SOLN
85.0000 mg | Freq: Two times a day (BID) | SUBCUTANEOUS | Status: DC
  Administered 2020-02-16: 09:00:00 85 mg via SUBCUTANEOUS
  Filled 2020-02-16 (×3): qty 1

## 2020-02-16 MED ORDER — ENOXAPARIN SODIUM 100 MG/ML ~~LOC~~ SOLN
85.0000 mg | Freq: Two times a day (BID) | SUBCUTANEOUS | Status: DC
  Administered 2020-02-17 – 2020-02-20 (×7): 85 mg via SUBCUTANEOUS
  Filled 2020-02-16 (×8): qty 1

## 2020-02-16 MED ORDER — INSULIN DETEMIR 100 UNIT/ML ~~LOC~~ SOLN
10.0000 [IU] | Freq: Every day | SUBCUTANEOUS | Status: DC
  Administered 2020-02-16 – 2020-02-17 (×2): 10 [IU] via SUBCUTANEOUS
  Filled 2020-02-16 (×2): qty 0.1

## 2020-02-16 NOTE — Plan of Care (Signed)
Received call from nursing that pt still having bloody stools.  Reviewed progress notes from today.  Apparently IVC filter in consideration if pt still having bloody stools. Will hold this evening's dose of Lovenox and reschedule to start tomorrow AM.  Defer to rounding team to decide if pt requires IVC filter or not.

## 2020-02-16 NOTE — Progress Notes (Signed)
MEWS score from green to yellow (3). Dr Cathlean Sauer aware and pt has reported generalized pain. Pt had medium size blood loose stool. Pt is now clean and dry. Spouse able to speak w/pt on phone.

## 2020-02-16 NOTE — Progress Notes (Addendum)
PROGRESS NOTE    Jimmy [name]  [id number] DOB: 1946/12/18 DOA: 02/08/2020 PCP: [name], MD    Brief Narrative:  Patient was admitted to the hospital with a working diagnosis of acute hypoxic respiratory failure due to SARS COVID-19 viral pneumonia.Hospitalization complicated with left lower extremity DVT, metabolic encephalopathy and gi bleed.  74 year old male who presented with dyspnea. He does have significant past medical history for chronic kidney disease, hypertension, bladder cancer and coronary artery disease. Patient reported not feeling well for several days at home, having dyspnea and cough. Home health nurse noticed him to be more confused having increased oxygen requirements. On his initial physical examination in the emergency department he was in respiratory distress and placed on nonrebreather mask,with oxygen saturation 90%, his blood pressure was 129/79, heart rate 102, respiratory rate28.His lungs were clear to auscultation, no wheezing or rhonchi, heart S1-S2, present with, abdomen was soft and nontender, no lower extremity edema.Urinalysis specific gravity 1.012, 0-5 red cells, 11-20 white cells, large leukocytes. Chest radiograph with left lower lobe interstitial infiltrate, increased bilateral markings. CT chest negative for pulmonary embolism, bilateral apical groundglass opacities bilateral subpleural groundglass infiltrates.  Patient has received supplemental oxygen per high flow nasal cannula, medical therapy with remdesivir and dexamethasone. For persistent hypoxic respiratory failure he received SARS COVID-19 convalescent plasma.  He continued to have high oxygen requirements. Ultrasonography lower extremities was positive for deep vein thrombosis on the left.  Patient responded well to diuresis for non cardiogenic pulmonary edema with improvement of oxygenation.  Urothelial cancer: 06/22/2019. SP TURB with bilateralureteral  stentsplacement/final pathology showed high-grade infiltrating papillary adenocarcinoma, invading into the detrusor muscle with lymphovascular invasion. .  08/07/2019 patient underwent neoadjuvant chemotherapy,4 cycles completed October 16, 2019. 12/2019 underwent radical cystectomy and lymph node dissection.   Final pathology showed 1.5 cm residual tumor with invasion into the perivascular soft tissue. Final pathology stage T38 and 0 with 0 out of 10 lymph nodes involved.Currently under active surveillance.  Patient is very weak and deconditioned, poor oral intake. Poorly responsive. Had bloody stools and anticoagulation was held for 24 H.   Enoxaparin have been resumed, but if recurrent bleeding patient may need IVC filter.    Assessment & Plan:   Principal Problem:   Pneumonia due to COVID-19 virus Active Problems:   Hypertension associated with type 2 diabetes mellitus ([place])   Malignant neoplasm of urinary bladder (HCC)   Acute respiratory failure due to COVID-19 ([place])   1. Acute hypoxic respiratory failure due to SARS COVID 19 viral pneumonia.sp remdesivir #5/5.Dexamethasone #5/5.   RR: 16  Pulse oxymetry: 96%  Fi02: 2 L/ min per [place]  Patient oxygen requirements continue to be stable. Patient is very weak and deconditioned. Will continue withantitussive agents, bronchodilators and airway clearing techniques with flutter valve and incentive spirometer. Out bed to chair as tolerated. His po intake has been very poor.   Plan for SNF at discharge.   2. Acute left lower extremity DVT/ complicated by NEW bloody stools. Patient's Hgb has been stable 11 to 12, will resume full dose anticoagulation with enoxaparin, he will need observation for at least 48 H. If recurrent bleeding patient will need IVC filter.   3. Lower GI bleeding/ suspected ischemic colitis covid related. No further report of bloody stools, no abdominal pain, no nausea or vomiting. Will continue close  follow up of Hgb and Hct. Will change pantoprazole to po daily for now.   4. Metabolic encephalopathy.Head CT on admission with atrophy  and chronic microvascular disease.Patient has remained very weak and decondtioned. Continue with aspiration precautions with dysphagia 2 diet per speech therapy recommendations.   5. 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 No clear about urinary symptoms,His culture was positive for coagulase negative staph 80, 000 CFS and aerococcus >100, 000 CFU, likely a contamination.No signs of bacterial infection.  6. CKD stage 3awith hyperkalemia/ non anion gap metabolic acidosis/ hypernatremia. Renal function with serum cr at 1,53, K at 5,1 and serum bicarbonate at 21 and worsening Na at 145. Continue with sodium zirconium at 10 mg tid and will add gentle hydration with half NS at 50 ml per H.   7. T2DM(Hgb A1c 6.0). Fasting glucose is 356, with capillary 267, and 281. Will increase basal insulin to 10 units and will continue sliding scale for glucose cover and monitoring. Patient with poor oral intake.   8. Unspecified calorie protein malnutrition. Continue with nutritional supplements.   DVT prophylaxis:enoxaparin  Code Status:full Family Communication:I spoke over the phone with the patient's daughter about patient's  condition, plan of care, prognosis and all questions were addressed. Disposition Plan/ discharge barriers:patient continue acutely ill, will need SNF when more stable, and tolerating full anticoagulation.    Nutrition Status: Nutrition Problem: Increased nutrient needs Etiology: acute illness, catabolic illness(COVID) Signs/Symptoms: estimated needs Interventions: Ensure Enlive (each supplement provides 350kcal and 20 grams of protein), Magic cup, Hormel Shake, MVI     Subjective: Patient continue to be poorly reactive, following simple commands and answering to simple questions. Denies any pain or  dyspnea. This am with no reported bloody stools.   Objective: Vitals:   02/16/20 0712 02/16/20 0713 02/16/20 0714 02/16/20 0715  BP: 126/79  126/79   Pulse: 88 85 85 86  Resp: 20  16   Temp: 97.7 F (36.5 C)  98.5 F (36.9 C)   TempSrc: Oral  Axillary   SpO2: 97% 96% 96% 96%  Weight:      Height:        Intake/Output Summary (Last 24 hours) at 02/16/2020 0902 Last data filed at 02/16/2020 0600 Gross per 24 hour  Intake 300 ml  Output 2975 ml  Net -2675 ml   Filed Weights   02/08/20 1450 02/08/20 2230 02/15/20 1032  Weight: 94.5 kg (!) 185.7 kg 85.6 kg    Examination:   General: Not in pain or dyspnea, deconditioned and ill looking appearing Neurology: Awake and alert, non focal, very slow to respond, generalized weakness.   E ENT: no pallor, no icterus, oral mucosa moist Cardiovascular: No JVD. S1-S2 present, rhythmic, no gallops, rubs, or murmurs. No lower extremity edema. Pulmonary: vesicular breath sounds bilaterally, adequate air movement, no wheezing, rhonchi or rales. Gastrointestinal. Abdomen with, no organomegaly, non tender, no rebound or guarding/ stoma in place with clear urine, abdominal wounds with dressing in place.  Skin. No rashes Musculoskeletal: no joint deformities     Data Reviewed: I have personally reviewed following labs and imaging studies  CBC: Recent Labs  Lab 02/10/20 0000 02/10/20 0000 02/12/20 1029 02/13/20 0040 02/14/20 0900 02/15/20 0304 02/16/20 0431  WBC 4.7  --  8.2 6.5  --  6.9 9.0  NEUTROABS  --   --   --   --   --  6.2 8.2*  HGB 11.6*   < > 13.0 11.6* 11.8* 11.5* 12.2*  HCT 36.8*   < > 41.0 37.4* 38.2* 36.9* 39.1  MCV 90.0  --  90.9 91.9  --  90.9  90.9  PLT 224  --  291 235  --  217 230   < > = values in this interval not displayed.   Basic Metabolic Panel: Recent Labs  Lab 02/10/20 0000 02/11/20 0347 02/12/20 0142 02/13/20 0040 02/14/20 0050 02/15/20 0304 02/16/20 0431  NA 142   < > 138 140 142 145 145  K  5.0   < > 5.1 4.4 4.7 5.0 5.1  CL 113*   < > 110 111 114* 114* 113*  CO2 20*   < > 17* 19* 18* 21* 21*  GLUCOSE 167*   < > 218* 160* 319* 241* 356*  BUN 48*   < > 60* 66* 66* 63* 66*  CREATININE 1.36*   < > 1.44* 1.43* 1.48* 1.38* 1.53*  CALCIUM 9.5   < > 9.4 9.4 9.3 9.4 9.5  MG 2.0  --   --   --   --   --  2.4   < > = values in this interval not displayed.   GFR: Estimated Creatinine Clearance: 45.8 mL/min (A) (by C-G formula based on SCr of 1.53 mg/dL (H)). Liver Function Tests: Recent Labs  Lab 02/11/20 0347 02/12/20 0142 02/13/20 0040 02/14/20 0050  AST 71* 105* 98* 73*  ALT 40 65* 76* 72*  ALKPHOS 135* 160* 150* 139*  BILITOT 0.5 0.8 0.5 0.8  PROT 7.3 7.5 7.0 7.2  ALBUMIN 2.9* 3.1* 3.1* 3.0*   No results for input(s): LIPASE, AMYLASE in the last 168 hours. No results for input(s): AMMONIA in the last 168 hours. Coagulation Profile: No results for input(s): INR, PROTIME in the last 168 hours. Cardiac Enzymes: No results for input(s): CKTOTAL, CKMB, CKMBINDEX, TROPONINI in the last 168 hours. BNP (last 3 results) No results for input(s): PROBNP in the last 8760 hours. HbA1C: No results for input(s): HGBA1C in the last 72 hours. CBG: Recent Labs  Lab 02/15/20 1138 02/15/20 1559 02/15/20 2230 02/16/20 0706 02/16/20 0724  GLUCAP 207* 198* 325* 267* 281*   Lipid Profile: No results for input(s): CHOL, HDL, LDLCALC, TRIG, CHOLHDL, LDLDIRECT in the last 72 hours. Thyroid Function Tests: No results for input(s): TSH, T4TOTAL, FREET4, T3FREE, THYROIDAB in the last 72 hours. Anemia Panel: Recent Labs    02/14/20 0050  FERRITIN 81      Radiology Studies: I have reviewed all of the imaging during this hospital visit personally     Scheduled Meds: . vitamin C  500 mg Oral Daily  . chlorpheniramine-HYDROcodone  5 mL Oral Q12H  . dexamethasone (DECADRON) injection  6 mg Intravenous Q24H  . enoxaparin (LOVENOX) injection  85 mg Subcutaneous Q12H  . feeding  supplement (ENSURE ENLIVE)  237 mL Oral TID BM  . insulin aspart  0-5 Units Subcutaneous QHS  . insulin aspart  0-9 Units Subcutaneous TID WC  . insulin detemir  5 Units Subcutaneous Daily  . Ipratropium-Albuterol  1 puff Inhalation Q6H  . multivitamin with minerals  1 tablet Oral Daily  . nystatin  5 mL Oral QID  . pantoprazole (PROTONIX) IV  40 mg Intravenous Q12H  . sodium chloride flush  3 mL Intravenous Once  . sodium chloride flush  3 mL Intravenous Q12H  . sodium zirconium cyclosilicate  10 g Oral TID  . thiamine  100 mg Oral Daily  . zinc sulfate  220 mg Oral Daily   Continuous Infusions: . sodium chloride       LOS: 8 days        Teal Raben  Gerome Apley, MD

## 2020-02-16 NOTE — Progress Notes (Signed)
  Speech Language Pathology Treatment: Dysphagia  Patient Details Name: Jimmy [name] MRN: [id number] DOB: [birth year] Today's Date: 02/16/2020 Time: [id number] SLP Time Calculation (min) (ACUTE ONLY): 25 min  Assessment / Plan / Recommendation Clinical Impression  Pt demonstrates ongoing signs of aspiration with thin liquids (Ensure) including immediate coughing. He tolerates nectar thick liquids, but only with controlled bolus size (small single cup sips or teaspoon) but continues to cough if allowed to drink from a straw. Will proceed with MBS given plan for d/c to SNF to guide f/u interventions.   HPI HPI: 74 year old male admitted with COVID/PNA; PMH bladder cancer, OSA, DM, bronchitis, CAD. OT  witnessed pt coughing with 3/4 sips water.      SLP Plan  Continue with current plan of care       Recommendations  Diet recommendations: Dysphagia 1 (puree);Nectar-thick liquid Liquids provided via: Teaspoon;Cup;No straw Medication Administration: Whole meds with puree Supervision: Staff to assist with self feeding;Full supervision/cueing for compensatory strategies Compensations: Minimize environmental distractions;Slow rate;Small sips/bites;Lingual sweep for clearance of pocketing Postural Changes and/or Swallow Maneuvers: Seated upright 90 degrees                Follow up Recommendations: Skilled Nursing facility SLP Visit Diagnosis: Dysphagia, unspecified (R13.10) Plan: Continue with current plan of care       GO                Kesha Hurrell, Katherene Ponto 02/16/2020, 10:34 AM

## 2020-02-16 NOTE — Plan of Care (Signed)
  Problem: Education: Goal: Knowledge of risk factors and measures for prevention of condition will improve Outcome: Progressing   Problem: Coping: Goal: Psychosocial and spiritual needs will be supported Outcome: Progressing   Problem: Respiratory: Goal: Will maintain a patent airway Outcome: Progressing Goal: Complications related to the disease process, condition or treatment will be avoided or minimized Outcome: Progressing   

## 2020-02-16 NOTE — Progress Notes (Signed)
Pt sitting upright for breakfast. Pt has good appetite. Pt drank nectar thick tea and milk with no problems. Pt was reminded to chew food that is in mouth prior to next intake. Pt is cooperative and calm. Pt answers simple questions appropriately. Pt now on 2L O2 via [place]. VSS. Pt has pulled off leads and gown earlier and pt reoriented to situation and equipment.

## 2020-02-16 NOTE — Progress Notes (Signed)
MEWs fired RED due to RR and increased HR.  Vitals had been taken after patient was moved around and repositioned in the bed.  Vitals were retaken a short time after and MEWs dropped back to YELLOW, which is where patient was for a period of time throughout the day because of increased HR.  Day shift providers were made aware by day RN.  Notified night provider to ensure everyone was on same page.  Charge nurse notified of patient's status.  Provider came to visit patient and medication was ordered and given to help with HR and BP.  Vitals will be monitored more frequently tonight.

## 2020-02-16 NOTE — Progress Notes (Signed)
Physical Therapy Treatment Patient Details Name: Jimmy [name] MRN: [id number] DOB: Sep 09, 1946 Today's Date: 02/16/2020    History of Present Illness 74 year old male admitted with COVID/PNA; PMH includes bladder cancer- ileus conduit with urinary diversion. Has OSA but generally does not use his CPAP. Has had multiple surgeries- including for bladder cancer- most recent Dec 2020    PT Comments    Patient in bed when PT arrived- sleeping but aroused to name. RN advised that he had been very lethargic throughout the day, and although he drank a lot of fluids, he did not attempt to do anything for self even with a great deal of encouragement. He presents as very depressed (?). He did agree to perform ex- but RN did indicate that best to not try any OOB activity today. He was able to perform LE ex as outlined with A, AA and CUEs- in conjunction with proper breathing techniques. Will continue to monitor closely and determine any consistency of progression with PT. He is extremely deconditioned and has had a complicated medical history due to the surgeries related to bladder cancer.   Follow Up Recommendations  SNF     Equipment Recommendations       Recommendations for Other Services       Precautions / Restrictions Precautions Precautions: Fall Precaution Comments: Monitor closely related to O2- He presents as confused- slow to respond and complete tasks today. Restrictions Weight Bearing Restrictions: No    Mobility  Bed Mobility Overal bed mobility: Needs Assistance(he was very lethargic and unable to perform any bed mobility this afternoon. Did discuss the importance of being OOB and increase overall mobility)                Transfers                    Ambulation/Gait                 Stairs             Wheelchair Mobility    Modified Rankin (Stroke Patients Only)       Balance Overall balance assessment: Needs assistance                                          Cognition Arousal/Alertness: Lethargic Behavior During Therapy: Flat affect;Anxious Overall Cognitive Status: Impaired/Different from baseline Area of Impairment: Orientation;Attention;Memory;Following commands;Safety/judgement;Awareness;Problem solving                 Orientation Level: Disoriented to;Place;Time;Situation Current Attention Level: Sustained   Following Commands: Follows one step commands with increased time Safety/Judgement: Decreased awareness of safety;Decreased awareness of deficits   Problem Solving: Slow processing;Decreased initiation;Difficulty sequencing;Requires verbal cues;Requires tactile cues General Comments: He still speaks only in a whisper. He was very sleepy today- RN says he would not do anything for himself today. He presents as very depressed. Did agree to perform ex- but RN advised no OOB this afternoon.      Exercises General Exercises - Lower Extremity Ankle Circles/Pumps: Supine;10 reps;AAROM;AROM Quad Sets: AAROM;AROM;10 reps;Supine Gluteal Sets: AROM;Seated;Both Short Arc Quad: AAROM;Supine;10 reps Hip ABduction/ADduction: AAROM;AROM;Supine;10 reps Other Exercises Other Exercises: Reviewed and reinforced in Pursed lip breathing- he is a mouth breather--needed cues to properly perform optimal breathing techniques Other Exercises: Practiced IS and only able to achieve 500 for 8 out of 10 reps Other Exercises:  Fair return demo for flutter valve unit    General Comments General comments (skin integrity, edema, etc.): Flat affect- presents as very depressed- per RN he did not make any efforts to help self today.      Pertinent Vitals/Pain Faces Pain Scale: Hurts a little bit Pain Location: general discomfort Pain Descriptors / Indicators: Discomfort    Home Living                      Prior Function            PT Goals (current goals can now be found in the care plan section)  Acute Rehab PT Goals Patient Stated Goal: none stated- except wants to go home PT Goal Formulation: With patient Time For Goal Achievement: 02/28/20 Potential to Achieve Goals: Fair Progress towards PT goals: PT to reassess next treatment    Frequency    Min 3X/week      PT Plan      Co-evaluation              AM-PAC PT "6 Clicks" Mobility   Outcome Measure  Help needed turning from your back to your side while in a flat bed without using bedrails?: A Lot Help needed moving from lying on your back to sitting on the side of a flat bed without using bedrails?: A Lot Help needed moving to and from a bed to a chair (including a wheelchair)?: A Lot Help needed standing up from a chair using your arms (e.g., wheelchair or bedside chair)?: A Lot Help needed to walk in hospital room?: A Lot Help needed climbing 3-5 steps with a railing? : A Lot 6 Click Score: 12    End of Session Equipment Utilized During Treatment: Oxygen Activity Tolerance: Patient limited by fatigue;Patient limited by lethargy;Treatment limited secondary to medical complications (Comment) Patient left: in bed;with call bell/phone within reach;with bed alarm set   PT Visit Diagnosis: Unsteadiness on feet (R26.81);Other abnormalities of gait and mobility (R26.89);Muscle weakness (generalized) (M62.81)     Time: [id number] PT Time Calculation (min) (ACUTE ONLY): 30 min  Charges:  $Therapeutic Exercise: 23-37 mins                    Rollen Sox, PT # [phone] CGV cell   Casandra Doffing 02/16/2020, 6:24 PM

## 2020-02-16 NOTE — Progress Notes (Signed)
Freestyle Libre sensor placed on RUE.

## 2020-02-16 NOTE — Evaluation (Signed)
Modified Barium Swallow Progress Note  Patient Details  Name: Jimmy [name] MRN: [id number] Date of Birth: 10/04/1946  Today's Date: 02/16/2020  Modified Barium Swallow completed.  Full report located under Chart Review in the Imaging Section.  Brief recommendations include the following:  Clinical Impression  Pt demonstrates moderate oral impairment including failure to fully form a bolus with a thrusting machanism to transit poteriorally, leading to consistent premature spillage to pyriform sinuses. Pt has a posterior head tilt and even with total assist could barely bring head to neutral during study. This also contributes to direct spillage to liquids to the pyriform sinuses. If the bolus is large, there is penetration and even aspiration with large thin boluses. Pt senses significant volume aspiration, but cough is not strong enough to expectorate. There is also mild to moderate pharyngeal residue due both to weak pharyngeal stripping as well as suspected dry pharyngeal mucosa. Recommend continuing current diet of dys 2 (observed to do well at meal, but would not masticate solids in the study) with nectar thick liquids via small cup sip or spoon only, no straws. Pt may benefit from pharyngeal strengthing, exercises to target cough strength and also improved neck ROM. Will f/u for interventions.    Swallow Evaluation Recommendations       SLP Diet Recommendations: Dysphagia 2 (Fine chop) solids;Nectar thick liquid   Liquid Administration via: Cup;Spoon;No straw   Medication Administration: Whole meds with puree   Supervision: Full assist for feeding   Compensations: Slow rate;Small sips/bites;Clear throat intermittently   Postural Changes: Seated upright at 90 degrees   Oral Care Recommendations: Oral care BID   Other Recommendations: Have oral suction available   [name], MA CCC-SLP  Acute Rehabilitation Services Pager [phone] Office [phone]  Lenka Zhao,  Katherene Ponto 02/16/2020,10:50 AM

## 2020-02-17 ENCOUNTER — Inpatient Hospital Stay (HOSPITAL_COMMUNITY): Payer: Medicare PPO

## 2020-02-17 DIAGNOSIS — E118 Type 2 diabetes mellitus with unspecified complications: Secondary | ICD-10-CM

## 2020-02-17 DIAGNOSIS — I82401 Acute embolism and thrombosis of unspecified deep veins of right lower extremity: Secondary | ICD-10-CM

## 2020-02-17 DIAGNOSIS — J9601 Acute respiratory failure with hypoxia: Secondary | ICD-10-CM

## 2020-02-17 DIAGNOSIS — N1831 Chronic kidney disease, stage 3a: Secondary | ICD-10-CM

## 2020-02-17 LAB — BASIC METABOLIC PANEL
Anion gap: 11 (ref 5–15)
BUN: 68 mg/dL — ABNORMAL HIGH (ref 8–23)
CO2: 23 mmol/L (ref 22–32)
Calcium: 9.7 mg/dL (ref 8.9–10.3)
Chloride: 117 mmol/L — ABNORMAL HIGH (ref 98–111)
Creatinine, Ser: 1.39 mg/dL — ABNORMAL HIGH (ref 0.61–1.24)
GFR calc Af Amer: 58 mL/min — ABNORMAL LOW (ref >60)
GFR calc non Af Amer: 50 mL/min — ABNORMAL LOW (ref >60)
Glucose, Bld: 267 mg/dL — ABNORMAL HIGH (ref 70–99)
Potassium: 4.5 mmol/L (ref 3.5–5.1)
Sodium: 151 mmol/L — ABNORMAL HIGH (ref 135–145)

## 2020-02-17 LAB — CBC WITH DIFFERENTIAL/PLATELET
Abs Immature Granulocytes: 0.09 10*3/uL — ABNORMAL HIGH (ref 0.00–0.07)
Basophils Absolute: 0 10*3/uL (ref 0.0–0.1)
Basophils Relative: 0 %
Eosinophils Absolute: 0 10*3/uL (ref 0.0–0.5)
Eosinophils Relative: 0 %
HCT: 39.9 % (ref 39.0–52.0)
Hemoglobin: 12.4 g/dL — ABNORMAL LOW (ref 13.0–17.0)
Immature Granulocytes: 1 %
Lymphocytes Relative: 3 %
Lymphs Abs: 0.2 10*3/uL — ABNORMAL LOW (ref 0.7–4.0)
MCH: 28.5 pg (ref 26.0–34.0)
MCHC: 31.1 g/dL (ref 30.0–36.0)
MCV: 91.7 fL (ref 80.0–100.0)
Monocytes Absolute: 0.5 10*3/uL (ref 0.1–1.0)
Monocytes Relative: 7 %
Neutro Abs: 7 10*3/uL (ref 1.7–7.7)
Neutrophils Relative %: 89 %
Platelets: 177 10*3/uL (ref 150–400)
RBC: 4.35 MIL/uL (ref 4.22–5.81)
RDW: 16.1 % — ABNORMAL HIGH (ref 11.5–15.5)
WBC: 7.9 10*3/uL (ref 4.0–10.5)
nRBC: 0 % (ref 0.0–0.2)

## 2020-02-17 LAB — C-REACTIVE PROTEIN: CRP: 1.4 mg/dL — ABNORMAL HIGH (ref <1.0)

## 2020-02-17 LAB — FERRITIN: Ferritin: 37 ng/mL (ref 24–336)

## 2020-02-17 LAB — D-DIMER, QUANTITATIVE: D-Dimer, Quant: 1.48 ug/mL-FEU — ABNORMAL HIGH (ref 0.00–0.50)

## 2020-02-17 LAB — PHOSPHORUS: Phosphorus: 3.8 mg/dL (ref 2.5–4.6)

## 2020-02-17 LAB — MAGNESIUM: Magnesium: 2.3 mg/dL (ref 1.7–2.4)

## 2020-02-17 MED ORDER — INSULIN ASPART 100 UNIT/ML ~~LOC~~ SOLN
5.0000 [IU] | Freq: Three times a day (TID) | SUBCUTANEOUS | Status: DC
  Administered 2020-02-18 (×2): 5 [IU] via SUBCUTANEOUS

## 2020-02-17 MED ORDER — DEXTROSE 5 % IV SOLN: INTRAVENOUS | Status: DC

## 2020-02-17 MED ORDER — INSULIN DETEMIR 100 UNIT/ML ~~LOC~~ SOLN
10.0000 [IU] | Freq: Two times a day (BID) | SUBCUTANEOUS | Status: DC
  Administered 2020-02-18 (×2): 10 [IU] via SUBCUTANEOUS
  Filled 2020-02-17 (×4): qty 0.1

## 2020-02-17 NOTE — Progress Notes (Signed)
   02/17/20 0800  Family/Significant Other Communication  Family/Significant Other Update Called;Updated  Called and updated wife on Plan of Care. All questions asked and all questions answered.

## 2020-02-17 NOTE — Progress Notes (Signed)
Sent Dr. Sherral Hammers a message, "Pts small Bm had streaks of blood FYI. I know yesterday the Lovenox was held but today it was resumed. Thank you for your time!"

## 2020-02-17 NOTE — Plan of Care (Signed)
Went over [place] with pt and his wife. All questions asked and all questions answered.

## 2020-02-17 NOTE — Progress Notes (Signed)
Physical Therapy Treatment Patient Details Name: Jimmy [name] MRN: [id number] DOB: 05-08-1946 Today's Date: 02/17/2020    History of Present Illness 74 year old male admitted with COVID/PNA; PMH includes bladder cancer- ileus conduit with urinary diversion. Has OSA but generally does not use his CPAP. Has had multiple surgeries- including for bladder cancer- most recent Dec 2020    PT Comments    He has been on serious decline over the last several days. He was very lethargic although would react to name- did agree to performing ex and wanted to be repositioned in bed. He is extremely deconditioned. Reviewed LE ex and performed same with A, AA and cues for ROM type format. Also encouraged pursed lip breathing and use of IS with fair return demo. Will monitor for a few more sessions. Current plan on discharge is to SNF.   Follow Up Recommendations  SNF     Equipment Recommendations       Recommendations for Other Services       Precautions / Restrictions Precautions Precautions: Fall Precaution Comments: Monitor closely related to O2- He presents as confused- slow to respond and complete tasks today. Restrictions Weight Bearing Restrictions: No    Mobility  Bed Mobility Overal bed mobility: Needs Assistance Bed Mobility: Rolling;Supine to Sit;Sit to Supine Rolling: Max assist   Supine to sit: Max assist Sit to supine: Mod assist   General bed mobility comments: He was unable to tolerate any supine to sit or OOB activity today- very weak/frail. Both LE are edematous  Transfers Overall transfer level: Needs assistance Equipment used: (Did not attempt any OOB activity this session-)                Ambulation/Gait                 Stairs             Wheelchair Mobility    Modified Rankin (Stroke Patients Only)       Balance Overall balance assessment: Needs assistance Sitting-balance support: Feet supported;Bilateral upper extremity  supported Sitting balance-Leahy Scale: Poor     Standing balance support: During functional activity;Bilateral upper extremity supported   Standing balance comment: Could not attempt any OOB- transfers or ambulation today. Has been declining                            Cognition Arousal/Alertness: Lethargic Behavior During Therapy: Flat affect;Anxious Overall Cognitive Status: Impaired/Different from baseline Area of Impairment: Orientation;Attention;Memory;Following commands;Safety/judgement;Awareness;Problem solving                 Orientation Level: Disoriented to;Place;Time;Situation Current Attention Level: Selective Memory: Decreased short-term memory Following Commands: Follows one step commands with increased time Safety/Judgement: Decreased awareness of safety;Decreased awareness of deficits Awareness: Emergent Problem Solving: Slow processing;Decreased initiation;Difficulty sequencing;Requires verbal cues;Requires tactile cues General Comments: He still speaks only in a whisper. He was very sleepy today- RN says he would not do anything for himself today. He presents as very depressed. Did agree to perform ex- but RN advised no OOB this afternoon.      Exercises Total Joint Exercises Ankle Circles/Pumps: AROM;Supine;AAROM Short Arc Quad: AROM;AAROM;Supine Hip ABduction/ADduction: AROM;AAROM;Supine Straight Leg Raises: AAROM;Supine Other Exercises Other Exercises: Reviewed and reinforced in Pursed lip breathing- he is a mouth breather--needed cues to properly perform optimal breathing techniques Other Exercises: Practiced IS and only able to achieve 500 for 8 out of 10 reps Other Exercises: Fair return  demo for flutter valve unit    General Comments        Pertinent Vitals/Pain Pain Assessment: No/denies pain Faces Pain Scale: Hurts little more Pain Location: general discomfort    Home Living                      Prior Function             PT Goals (current goals can now be found in the care plan section) Acute Rehab PT Goals Patient Stated Goal: none stated PT Goal Formulation: With patient Time For Goal Achievement: 02/28/20 Potential to Achieve Goals: Fair Progress towards PT goals: Not progressing toward goals - comment(He has been declining. Current plan is transfer to SNF.)    Frequency    Min 3X/week      PT Plan      Co-evaluation              AM-PAC PT "6 Clicks" Mobility   Outcome Measure  Help needed turning from your back to your side while in a flat bed without using bedrails?: A Lot Help needed moving from lying on your back to sitting on the side of a flat bed without using bedrails?: A Lot Help needed moving to and from a bed to a chair (including a wheelchair)?: A Lot Help needed standing up from a chair using your arms (e.g., wheelchair or bedside chair)?: A Lot Help needed to walk in hospital room?: A Lot Help needed climbing 3-5 steps with a railing? : Total 6 Click Score: 11    End of Session Equipment Utilized During Treatment: Oxygen Activity Tolerance: Patient limited by fatigue;Patient limited by lethargy;Treatment limited secondary to medical complications (Comment) Patient left: in bed;with call bell/phone within reach;with bed alarm set Nurse Communication: Mobility status PT Visit Diagnosis: Unsteadiness on feet (R26.81);Other abnormalities of gait and mobility (R26.89);Muscle weakness (generalized) (M62.81)     Time: 1210-1240 PT Time Calculation (min) (ACUTE ONLY): 30 min  Charges:  $Therapeutic Exercise: 8-22 mins $Therapeutic Activity: 8-22 mins                    Rollen Sox, PT # [phone] CGV cell  Casandra Doffing 02/17/2020, 5:30 PM

## 2020-02-17 NOTE — Progress Notes (Signed)
Inpatient Diabetes Program Recommendations  AACE/ADA: New Consensus Statement on Inpatient Glycemic Control (2015)  Target Ranges:  Prepandial:   less than 140 mg/dL      Peak postprandial:   less than 180 mg/dL (1-2 hours)      Critically ill patients:  140 - 180 mg/dL   Lab Results  Component Value Date   GLUCAP 323 (H) 02/16/2020   HGBA1C 6.0 (H) 02/09/2020    Review of Glycemic Control  Diabetes history: DM2 Outpatient Diabetes medications: Victoza 1.8 mg SQ QD Current orders for Inpatient glycemic control: Levemir 10 units QD, Novolog 0-9 units tidwc and hs  HgbA1C - 6.0%  CBGs 230 - 323 mg/dL over past 24H Poor po intake. Ensure Enlive ordered tid  Inpatient Diabetes Program Recommendations:     Consider IV insulin per [employer] to control blood sugars Please consider Covid 19 Glycemic Control Order Set  Will follow closely.  Thank you. Lorenda Peck, RD, LDN, CDE Inpatient Diabetes Coordinator [phone]

## 2020-02-17 NOTE — Progress Notes (Signed)
PROGRESS NOTE    Jimmy [name]  [id number] DOB: [birth year] DOA: 02/08/2020 PCP: [name], MD   Brief Narrative:  74 year old WM PMHx Dyspnea.  CKD, diabetes type 2 uncontrolled with complication, OSA uses CPAP HTN, HLD, bladder cancer, nephrolithiasis, prostate cancer s/p seed implant + XRT CAD, obesity,  Patient reported not feeling well for several days at home, having dyspnea and cough. Home health nurse noticed him to be more confused having increased oxygen requirements. On his initial physical examination in the emergency department he was in respiratory distress and placed on nonrebreather mask,with oxygen saturation 90%, his blood pressure was 129/79, heart rate 102, respiratory rate28.His lungs were clear to auscultation, no wheezing or rhonchi, heart S1-S2, present with, abdomen was soft and nontender, no lower extremity edema.Urinalysis specific gravity 1.012, 0-5 red cells, 11-20 white cells, large leukocytes. Chest radiograph with left lower lobe interstitial infiltrate, increased bilateral markings. CT chest negative for pulmonary embolism, bilateral apical groundglass opacities bilateral subpleural groundglass infiltrates.  Patient has received supplemental oxygen per high flow nasal cannula, medical therapy with remdesivir and dexamethasone. For persistent hypoxic respiratory failure he received SARS COVID-19 convalescent plasma.  He continued to have high oxygen requirements. Ultrasonography lower extremities was positive for deep vein thrombosis on the left.  Patient responded well to diuresis for non cardiogenic pulmonary edema with improvement of oxygenation.  Urothelial cancer: 06/22/2019. SP TURB with bilateralureteral stentsplacement/final pathology showed high-grade infiltrating papillary adenocarcinoma, invading into the detrusor muscle with lymphovascular invasion. .  08/07/2019 patient underwent neoadjuvant chemotherapy,4 cycles completed  October 16, 2019. 12/2019 underwent radical cystectomy and lymph node dissection.   Final pathology showed 1.5 cm residual tumor with invasion into the perivascular soft tissue. Final pathology stage T38 and 0 with 0 out of 10 lymph nodes involved.Currently under active surveillance.  Patient is very weak and deconditioned, poor oral intake. Poorly responsive. Had bloody stools and anticoagulation was heldfor 24 H.   Enoxaparin have been resumed, but if recurrent bleeding patient may need IVC filter.   Subjective: Alert, nods yes and no appropriately to questions, follows commands.  Per RN staff yesterday ambulated to chair with assistance.  Today barely able to raise arms and legs on command.  Unable to vocalize.   Assessment & Plan:   Principal Problem:   Pneumonia due to COVID-19 virus Active Problems:   Hypertension associated with type 2 diabetes mellitus ([place])   Malignant neoplasm of urinary bladder (HCC)   Acute respiratory failure due to COVID-19 [facility])  Covid pneumonia/acute respiratory failure with hypoxia COVID-19 Labs  Recent Labs    02/17/20 0358  DDIMER 1.48*  FERRITIN 37  CRP 1.4*    Lab Results  Component Value Date   SARSCOV2NAA POSITIVE (A) 02/08/2020   [place] NEGATIVE 12/23/2019   SARSCOV2NAA NOT DETECTED 12/07/2019   SARSCOV2NAA NEGATIVE 06/18/2019   -Decadron 6 mg daily -Remdesivir completed course   Patient oxygen requirements continue to be stable. Patient is very weak and deconditioned. Will continue withantitussive agents, bronchodilators and airway clearing techniques with flutter valve and incentive spirometer. Out bed to chair as tolerated. His po intake has been very poor.   Plan for SNF at discharge.   Acute LEFT lower extremity DVT/complicated by NEW bloody stools. -Continue full dose anticoagulation.  Continue to monitor patient's hemoglobin. -If signs of rebleed will require IVC filter  Lower GI bleed/suspected  ischemic colitis secondary to [place]  Lab 02/13/20 0040 02/14/20 0900 02/15/20 0304 02/16/20 0431 02/17/20 0358  HGB 11.6* 11.8* 11.5* 12.2* 12.4*  -H/H stable -Continue Protonix 40 mg BID  4. Metabolic encephalopathy.Head CT on admission with atrophy and chronic microvascular disease.Patient has remained very weak and decondtioned. Continue with aspiration precautions with dysphagia 2 diet per speech therapy recommendations.   5. 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 No clear about urinary symptoms,His culture was positive for coagulase negative staph 80, 000 CFS and aerococcus >100, 000 CFU, likely a contamination.No signs of bacterial infection.  6. CKD stage 3awith hyperkalemia/ non anion gap metabolic acidosis/ hypernatremia.Renal function with serum cr at 1,53, K at 5,1 and serum bicarbonate at 21 and worsening Na at 145. Continue with sodium zirconium at 10 mg tid and will add gentle hydration with half NS at 50 ml per H.   Diabetes type 2controlled with complication  -2/9 Hemoglobin A1c = 6.0  -2/17 increase Levemir 10 units BID -2/17 NovoLog 5 units qac -Moderate SSI  8. Unspecified calorie protein malnutrition. Continue with nutritional supplements.   Hypernatremia -2/17 D5W 67ml/hr  Extreme weakness -Per RN yesterday patient able to ambulate from bed to chair, today barely able to raise arms and legs.   -2/17 CT head negative for acute hemorrhage or infarct see results below   Goals of care -2/17 PT/OT recommend SNF    DVT prophylaxis: Lovenox therapeutic dose Code Status: Full Family Communication:  Disposition Plan:    Consultants:    Procedures/Significant Events:  2/17 CT head Wo contrast; negative acute intracranial hemorrhage.  Negative acute infarction      I have personally reviewed and interpreted all radiology studies and my findings are as above.  VENTILATOR SETTINGS: Nasal cannula 2/17  Flow; 2 L/min SPO2 94%   Cultures   Antimicrobials: Anti-infectives (From admission, onward)   Start     Dose/Rate Stop   02/09/20 1000  remdesivir 100 mg in sodium chloride 0.9 % 100 mL IVPB     100 mg 200 mL/hr over 30 Minutes 02/12/20 1829   02/09/20 0800  cefTRIAXone (ROCEPHIN) 1 g in sodium chloride 0.9 % 100 mL IVPB  Status:  Discontinued     1 g 200 mL/hr over 30 Minutes 02/10/20 1044   02/08/20 2200  remdesivir 200 mg in sodium chloride 0.9% 250 mL IVPB     200 mg 580 mL/hr over 30 Minutes 02/08/20 2246       Devices    LINES / TUBES:      Continuous Infusions: . sodium chloride 50 mL/hr at 02/16/20 1506  . sodium chloride       Objective: Vitals:   02/17/20 0341 02/17/20 0536 02/17/20 0716 02/17/20 0816  BP: (!) 139/91  (!) 153/89 (!) 150/89  Pulse: 98  96 92  Resp: 16  19 15   Temp: 98.6 F (37 C)  97.9 F (36.6 C) 98 F (36.7 C)  TempSrc: Axillary  Oral Oral  SpO2: 98%  97% 98%  Weight:  83.7 kg    Height:        Intake/Output Summary (Last 24 hours) at 02/17/2020 0842 Last data filed at 02/17/2020 [id number] Gross per 24 hour  Intake 2112.98 ml  Output 2200 ml  Net -87.02 ml   Filed Weights   02/08/20 2230 02/15/20 1032 02/17/20 0536  Weight: (!) 185.7 kg 85.6 kg 83.7 kg    Examination:  General: Alert, nods yes and no to questions, no acute respiratory distress Eyes: negative scleral hemorrhage, negative anisocoria, negative icterus ENT: Negative Runny nose, negative  gingival bleeding, Neck:  Negative scars, masses, torticollis, lymphadenopathy, JVD Lungs: Clear to auscultation bilaterally without wheezes or crackles Cardiovascular: Regular rate and rhythm without murmur gallop or rub normal S1 and S2 Abdomen: negative abdominal pain, nondistended, positive soft, bowel sounds, no rebound, no ascites, no appreciable mass Extremities: No significant cyanosis, clubbing, or edema bilateral lower extremities Skin: Negative rashes, lesions,  ulcers Psychiatric: Unable to assess secondary to patient not being able to vocalize his answers  Central nervous system:  Cranial nerves II through XII intact, tongue/uvula midline, all extremities muscle strength 2-3/5, sensation intact throughout, negative receptive aphasia.  .     Data Reviewed: Care during the described time interval was provided by me .  I have reviewed this patient's available data, including medical history, events of note, physical examination, and all test results as part of my evaluation.   CBC: Recent Labs  Lab 02/12/20 1029 02/12/20 1029 02/13/20 0040 02/14/20 0900 02/15/20 0304 02/16/20 0431 02/17/20 0358  WBC 8.2  --  6.5  --  6.9 9.0 7.9  NEUTROABS  --   --   --   --  6.2 8.2* 7.0  HGB 13.0   < > 11.6* 11.8* 11.5* 12.2* 12.4*  HCT 41.0   < > 37.4* 38.2* 36.9* 39.1 39.9  MCV 90.9  --  91.9  --  90.9 90.9 91.7  PLT 291  --  235  --  217 230 177   < > = values in this interval not displayed.   Basic Metabolic Panel: Recent Labs  Lab 02/13/20 0040 02/14/20 0050 02/15/20 0304 02/16/20 0431 02/17/20 0358  NA 140 142 145 145 151*  K 4.4 4.7 5.0 5.1 4.5  CL 111 114* 114* 113* 117*  CO2 19* 18* 21* 21* 23  GLUCOSE 160* 319* 241* 356* 267*  BUN 66* 66* 63* 66* 68*  CREATININE 1.43* 1.48* 1.38* 1.53* 1.39*  CALCIUM 9.4 9.3 9.4 9.5 9.7  MG  --   --   --  2.4  --    GFR: Estimated Creatinine Clearance: 49.9 mL/min (A) (by C-G formula based on SCr of 1.39 mg/dL (H)). Liver Function Tests: Recent Labs  Lab 02/11/20 0347 02/12/20 0142 02/13/20 0040 02/14/20 0050  AST 71* 105* 98* 73*  ALT 40 65* 76* 72*  ALKPHOS 135* 160* 150* 139*  BILITOT 0.5 0.8 0.5 0.8  PROT 7.3 7.5 7.0 7.2  ALBUMIN 2.9* 3.1* 3.1* 3.0*   No results for input(s): LIPASE, AMYLASE in the last 168 hours. No results for input(s): AMMONIA in the last 168 hours. Coagulation Profile: No results for input(s): INR, PROTIME in the last 168 hours. Cardiac Enzymes: No results  for input(s): CKTOTAL, CKMB, CKMBINDEX, TROPONINI in the last 168 hours. BNP (last 3 results) No results for input(s): PROBNP in the last 8760 hours. HbA1C: No results for input(s): HGBA1C in the last 72 hours. CBG: Recent Labs  Lab 02/16/20 0706 02/16/20 0724 02/16/20 1203 02/16/20 1520 02/16/20 2106  GLUCAP 267* 281* 367* 200* 323*   Lipid Profile: No results for input(s): CHOL, HDL, LDLCALC, TRIG, CHOLHDL, LDLDIRECT in the last 72 hours. Thyroid Function Tests: No results for input(s): TSH, T4TOTAL, FREET4, T3FREE, THYROIDAB in the last 72 hours. Anemia Panel: No results for input(s): VITAMINB12, FOLATE, FERRITIN, TIBC, IRON, RETICCTPCT in the last 72 hours. Urine analysis:    Component Value Date/Time   COLORURINE YELLOW 02/08/2020 1545   APPEARANCEUR CLOUDY (A) 02/08/2020 1545   LABSPEC 1.012 02/08/2020 1545  PHURINE 6.0 02/08/2020 1545   GLUCOSEU NEGATIVE 02/08/2020 1545   HGBUR NEGATIVE 02/08/2020 [place] 02/08/2020 1545   BILIRUBINUR n 06/08/2016 1109   [place] 02/08/2020 1545   PROTEINUR NEGATIVE 02/08/2020 1545   UROBILINOGEN negative 06/08/2016 1109   NITRITE NEGATIVE 02/08/2020 1545   LEUKOCYTESUR LARGE (A) 02/08/2020 1545   Sepsis Labs: @LABRCNTIP (procalcitonin:4,lacticidven:4)  ) Recent Results (from the past 240 hour(s))  Respiratory Panel by RT PCR (Flu A&B, Covid) - Nasopharyngeal Swab     Status: Abnormal   Collection Time: 02/08/20  3:14 PM   Specimen: Nasopharyngeal Swab  Result Value Ref Range Status   SARS Coronavirus 2 by RT PCR POSITIVE (A) NEGATIVE Final    Comment: RESULT CALLED TO, READ BACK BY AND VERIFIED WITH: HODGES,I. RN @2010  ON 02.08.2021 BY COHEN,K (NOTE) SARS-CoV-2 target nucleic acids are DETECTED. SARS-CoV-2 RNA is generally detectable in upper respiratory specimens  during the acute phase of infection. Positive results are indicative of the presence of the identified virus, but do not rule out  bacterial infection or co-infection with other pathogens not detected by the test. Clinical correlation with patient history and other diagnostic information is necessary to determine patient infection status. The expected result is Negative. Fact Sheet for Patients:  PinkCheek.be Fact Sheet for Healthcare Providers: GravelBags.it This test is not yet approved or cleared by the [place] FDA and  has been authorized for detection and/or diagnosis of SARS-CoV-2 by FDA under an Emergency Use Authorization (EUA).  This EUA will remain in effect (meaning this test can be  used) for the duration of  the COVID-19 declaration under Section 564(b)(1) of the Act, 21 U.S.C. section 360bbb-3(b)(1), unless the authorization is terminated or revoked sooner.    Influenza A by PCR NEGATIVE NEGATIVE Final   Influenza B by PCR NEGATIVE NEGATIVE Final    Comment: (NOTE) The Xpert Xpress SARS-CoV-2/FLU/RSV assay is intended as an aid in  the diagnosis of influenza from Nasopharyngeal swab specimens and  should not be used as a sole basis for treatment. Nasal washings and  aspirates are unacceptable for Xpert Xpress SARS-CoV-2/FLU/RSV  testing. Fact Sheet for Patients: PinkCheek.be Fact Sheet for Healthcare Providers: GravelBags.it This test is not yet approved or cleared by the [place] FDA and  has been authorized for detection and/or diagnosis of SARS-CoV-2 by  FDA under an Emergency Use Authorization (EUA). This EUA will remain  in effect (meaning this test can be used) for the duration of the  Covid-19 declaration under Section 564(b)(1) of the Act, 21  U.S.C. section 360bbb-3(b)(1), unless the authorization is  terminated or revoked. Performed at [facility], [place] [address] [place], [place] [address]   Urine culture     Status: Abnormal   Collection  Time: 02/08/20  3:45 PM   Specimen: Urine, Random  Result Value Ref Range Status   Specimen Description   Final    URINE, RANDOM Performed at [place] [address], [place], [place] [address]    Special Requests   Final    NONE Performed at [facility], [place] [address], [place], [place] [address]    Culture (A)  Final    >=100,000 COLONIES/mL AEROCOCCUS URINAE 80,000 COLONIES/mL STAPHYLOCOCCUS EPIDERMIDIS ORGANISM 1 Standardized susceptibility testing for this organism is not available. Performed at [facility], [place] [address] [place],  [address]    Report Status 02/11/2020 FINAL  Final   Organism ID,  Bacteria STAPHYLOCOCCUS EPIDERMIDIS (A)  Final      Susceptibility   Staphylococcus epidermidis - MIC*    CIPROFLOXACIN <=0.5 SENSITIVE Sensitive     GENTAMICIN <=0.5 SENSITIVE Sensitive     NITROFURANTOIN <=16 SENSITIVE Sensitive     OXACILLIN >=4 RESISTANT Resistant     TETRACYCLINE <=1 SENSITIVE Sensitive     VANCOMYCIN 1 SENSITIVE Sensitive     TRIMETH/SULFA 160 RESISTANT Resistant     CLINDAMYCIN <=0.25 SENSITIVE Sensitive     RIFAMPIN <=0.5 SENSITIVE Sensitive     Inducible Clindamycin NEGATIVE Sensitive     * 80,000 COLONIES/mL STAPHYLOCOCCUS EPIDERMIDIS         Radiology Studies: DG Swallowing Func-Speech Pathology  Result Date: 02/16/2020 Objective Swallowing Evaluation: Type of Study: MBS-Modified Barium Swallow Study  Patient Details Name: Jimmy [name] MRN: [id number] Date of Birth: [birth year] Today's Date: 02/16/2020 Time: SLP Start Time (ACUTE ONLY): 0930 -SLP Stop Time (ACUTE ONLY): 1015 SLP Time Calculation (min) (ACUTE ONLY): 45 min Past Medical History: Past Medical History: Diagnosis Date . Astigmatism of both eyes 11/01/2016 . Bronchitis  . Coronary artery disease  . Cortical age-related cataract of both eyes 11/01/2016 . Diabetes mellitus without complication ([place])   type 2 . Drug-induced  osteoporosis  . Dyslipidemia  . Fatigue  . Fatty liver  . History of kidney stones  . Hypercholesteremia  . Hypogonadism male  . Ischemic heart disease   s/p cath in 2000 showing nonobstructive CAD yet felt to have had a probable dissection of the RCA . Normal nuclear stress test March 2011 . Nuclear sclerotic cataract of both eyes 11/01/2016 . Obesity  . Prostate cancer [facility])   s/p seed implant and radiation . Sleep apnea   90% of time dosent wear his cpap per pt . Vitamin D insufficiency  Past Surgical History: Past Surgical History: Procedure Laterality Date . BUBBLE STUDY  12/30/2019  Procedure: BUBBLE STUDY;  Surgeon: Pixie Casino, MD;  Location: [facility];  Service: Cardiovascular;; . CARDIAC CATHETERIZATION  April 2000 . IR IMAGING GUIDED PORT INSERTION  07/30/2019 . IR REMOVAL TUN ACCESS W/ PORT W/O FL MOD SED  12/26/2019 . RADIOACTIVE SEED IMPLANT  2009  Implanted radiation seeds . TEE WITHOUT CARDIOVERSION N/A 12/30/2019  Procedure: TRANSESOPHAGEAL ECHOCARDIOGRAM (TEE);  Surgeon: Pixie Casino, MD;  Location: [facility] ENDOSCOPY;  Service: Cardiovascular;  Laterality: N/A; . thumb surgery    as a teen caught thumb in machine . TRANSURETHRAL RESECTION OF BLADDER TUMOR N/A 06/22/2019  Procedure: TRANSURETHRAL RESECTION OF BLADDER TUMOR (TURBT), CYSTOSCOPY,  BILATERAL RETROGRADE PYELOGRAPHY, BILATERAL STENT PLACEMENT;  Surgeon: Raynelle Bring, MD;  Location: [facility];  Service: Urology;  Laterality: N/A;  GENERAL WITH PARALYSIS HPI: 74 year old male admitted with COVID/PNA; PMH bladder cancer, OSA, DM, bronchitis, CAD. OT  witnessed pt coughing with 3/4 sips water.  No data recorded Assessment / Plan / Recommendation CHL IP CLINICAL IMPRESSIONS 02/16/2020 Clinical Impression Pt demonstrates moderate oral impairment including failure to fully form a bolus with a thrusting machanism to transit poteriorally, leading to consistent premature spillage to pyriform sinuses. Pt has a posterior head tilt and even with  total assist could barely bring head to neutral during study. This also contributes to direct spillage to liquids to the pyriform sinuses. If the bolus is large, there is penetration and even aspiration with large thin boluses. Pt senses significant volume aspiration, but cough is not strong enough to expectorate. There is also mild to moderate pharyngeal residue due both to  weak pharyngeal stripping as well as suspected dry pharyngeal mucosa. Recommend continuing current diet of dys 2 (observed to do well at meal, but would not masticate solids in the study) with nectar thick liquids via small cup sip or spoon only, no straws. Pt may benefit from pharyngeal strengthing, exercises to target cough strength and also improved neck ROM. Will f/u for interventions.  SLP Visit Diagnosis Dysphagia, oropharyngeal phase (R13.12) Attention and concentration deficit following -- Frontal lobe and executive function deficit following -- Impact on safety and function Moderate aspiration risk   CHL IP TREATMENT RECOMMENDATION 02/16/2020 Treatment Recommendations Therapy as outlined in treatment plan below   Prognosis 02/16/2020 Prognosis for Safe Diet Advancement Fair Barriers to Reach Goals -- Barriers/Prognosis Comment -- CHL IP DIET RECOMMENDATION 02/16/2020 SLP Diet Recommendations Dysphagia 2 (Fine chop) solids;Nectar thick liquid Liquid Administration via Cup;Spoon;No straw Medication Administration Whole meds with puree Compensations Slow rate;Small sips/bites;Clear throat intermittently Postural Changes Seated upright at 90 degrees   CHL IP OTHER RECOMMENDATIONS 02/16/2020 Recommended Consults -- Oral Care Recommendations Oral care BID Other Recommendations Have oral suction available   CHL IP FOLLOW UP RECOMMENDATIONS 02/16/2020 Follow up Recommendations Skilled Nursing facility   [facility] IP FREQUENCY AND DURATION 02/16/2020 Speech Therapy Frequency (ACUTE ONLY) min 2x/week Treatment Duration 2 weeks      CHL IP ORAL PHASE  02/16/2020 Oral Phase Impaired Oral - Pudding Teaspoon -- Oral - Pudding Cup -- Oral - Honey Teaspoon -- Oral - Honey Cup -- Oral - Nectar Teaspoon Premature spillage;Decreased bolus cohesion Oral - Nectar Cup Premature spillage;Decreased bolus cohesion Oral - Nectar Straw Premature spillage;Decreased bolus cohesion Oral - Thin Teaspoon Premature spillage;Decreased bolus cohesion Oral - Thin Cup Premature spillage;Decreased bolus cohesion Oral - Thin Straw NT Oral - Puree Decreased bolus cohesion Oral - Mech Soft -- Oral - Regular Other (Comment) Oral - Multi-Consistency -- Oral - Pill -- Oral Phase - Comment --  CHL IP PHARYNGEAL PHASE 02/16/2020 Pharyngeal Phase Impaired Pharyngeal- Pudding Teaspoon -- Pharyngeal -- Pharyngeal- Pudding Cup -- Pharyngeal -- Pharyngeal- Honey Teaspoon -- Pharyngeal -- Pharyngeal- Honey Cup -- Pharyngeal -- Pharyngeal- Nectar Teaspoon Delayed swallow initiation-pyriform sinuses;Reduced pharyngeal peristalsis;Pharyngeal residue - valleculae;Pharyngeal residue - pyriform Pharyngeal -- Pharyngeal- Nectar Cup Delayed swallow initiation-pyriform sinuses;Reduced pharyngeal peristalsis;Pharyngeal residue - valleculae;Pharyngeal residue - pyriform;Penetration/Aspiration during swallow;Penetration/Apiration after swallow;Trace aspiration Pharyngeal Material enters airway, remains ABOVE vocal cords and not ejected out Pharyngeal- Nectar Straw Delayed swallow initiation-pyriform sinuses;Reduced pharyngeal peristalsis;Pharyngeal residue - valleculae;Pharyngeal residue - pyriform;Penetration/Aspiration during swallow;Penetration/Apiration after swallow;Trace aspiration Pharyngeal Material enters airway, passes BELOW cords without attempt by patient to eject out (silent aspiration) Pharyngeal- Thin Teaspoon Delayed swallow initiation-pyriform sinuses;Reduced pharyngeal peristalsis;Pharyngeal residue - valleculae;Pharyngeal residue - pyriform;Penetration/Aspiration during  swallow;Penetration/Apiration after swallow;Trace aspiration Pharyngeal Material enters airway, passes BELOW cords without attempt by patient to eject out (silent aspiration) Pharyngeal- Thin Cup Delayed swallow initiation-pyriform sinuses;Reduced pharyngeal peristalsis;Pharyngeal residue - valleculae;Pharyngeal residue - pyriform;Penetration/Aspiration during swallow;Penetration/Apiration after swallow;Trace aspiration Pharyngeal Material enters airway, passes BELOW cords and not ejected out despite cough attempt by patient Pharyngeal- Thin Straw -- Pharyngeal -- Pharyngeal- Puree -- Pharyngeal -- Pharyngeal- Mechanical Soft -- Pharyngeal -- Pharyngeal- Regular -- Pharyngeal -- Pharyngeal- Multi-consistency -- Pharyngeal -- Pharyngeal- Pill -- Pharyngeal -- Pharyngeal Comment --  CHL IP CERVICAL ESOPHAGEAL PHASE 02/16/2020 Cervical Esophageal Phase WFL Pudding Teaspoon -- Pudding Cup -- Honey Teaspoon -- Honey Cup -- Nectar Teaspoon -- Nectar Cup -- Nectar Straw -- Thin Teaspoon -- Thin Cup -- Thin Straw -- Puree -- Mechanical  Soft -- Regular -- Multi-consistency -- Pill -- Cervical Esophageal Comment -- DeBlois, Katherene Ponto 02/16/2020, 10:53 AM                   Scheduled Meds: . vitamin C  500 mg Oral Daily  . chlorpheniramine-HYDROcodone  5 mL Oral Q12H  . dexamethasone (DECADRON) injection  6 mg Intravenous Q24H  . enoxaparin (LOVENOX) injection  85 mg Subcutaneous Q12H  . feeding supplement (ENSURE ENLIVE)  237 mL Oral TID BM  . insulin aspart  0-5 Units Subcutaneous QHS  . insulin aspart  0-9 Units Subcutaneous TID WC  . insulin detemir  10 Units Subcutaneous Daily  . Ipratropium-Albuterol  1 puff Inhalation Q6H  . multivitamin with minerals  1 tablet Oral Daily  . nystatin  5 mL Oral QID  . pantoprazole (PROTONIX) IV  40 mg Intravenous Q12H  . sodium chloride flush  3 mL Intravenous Once  . sodium chloride flush  3 mL Intravenous Q12H  . sodium zirconium cyclosilicate  10 g Oral TID   . thiamine  100 mg Oral Daily  . zinc sulfate  220 mg Oral Daily   Continuous Infusions: . sodium chloride 50 mL/hr at 02/16/20 1506  . sodium chloride       LOS: 9 days   The patient is critically ill with multiple organ systems failure and requires high complexity decision making for assessment and support, frequent evaluation and titration of therapies, application of advanced monitoring technologies and extensive interpretation of multiple databases. Critical Care Time devoted to patient care services described in this note  Time spent: 40 minutes     WOODS, Geraldo Docker, MD [facility] Pager [phone]  If 7PM-7AM, please contact night-coverage [email or web address] Password [email or web address] 02/17/2020, 8:42 AM

## 2020-02-17 NOTE — Plan of Care (Signed)
Pt still with small brown/bloody stools. Lovenox held during the night. Has significant weakness, not moving lower extremities much at all. May wiggle toes but did not help turn during the night by lifting/moving legs. IV fluids infusing. Urostomy draining well. Tolerating 2L [place]. Safety precautions in place. Problem: Education: Goal: Knowledge of risk factors and measures for prevention of condition will improve Outcome: Progressing   Problem: Coping: Goal: Psychosocial and spiritual needs will be supported Outcome: Progressing   Problem: Respiratory: Goal: Will maintain a patent airway Outcome: Progressing Goal: Complications related to the disease process, condition or treatment will be avoided or minimized Outcome: Progressing

## 2020-02-17 NOTE — TOC Progression Note (Addendum)
Transition of Care [facility]) - Progression Note    Patient Details  Name: DUONG MESERVEY MRN: [id number] Date of Birth: 02-28-1946  Transition of Care [facility]) CM/SW Contact  Ross Ludwig, [place] Phone Number: 02/17/2020, 12:16 PM  Clinical Narrative:     CSW received email back from patient's daughter Janett Billow, they have chosen [facility] for SNF placement.  CSW spoke to [facility] they are going to verify patient's benefits.  Patient is [facility], once patient is medically ready for discharge, CSW will begin insurance auth for SNF.  CSW continuing to follow patient's progress throughout discharge planning.  5:00pm   CSW received a phone call from [facility] and they said patient has a [place] [employer] worker's comp open claim from 05/01/2011, and they are not able to verify insurance days without it being cleared.  Patient's family will have to contact Medicare to let them it has been cleared.  CSW to follow up with family tomorrow.   Expected Discharge Plan: Skilled Nursing Facility Barriers to Discharge: Ship broker, Continued Medical Work up  Expected Discharge Plan and Services Expected Discharge Plan: [place] In-house Referral: Clinical Social Work   Post Acute Care Choice: [place] Living arrangements for the past 2 months: Single Family Home                                       Social Determinants of Health (SDOH) Interventions    Readmission Risk Interventions Readmission Risk Prevention Plan 02/15/2020  Transportation Screening Complete  PCP or Specialist Appt within 3-5 Days Complete  HRI or [place] Complete  Social Work Consult for [place] Planning/Counseling Complete  Palliative Care Screening Not Complete  Palliative Care Screening Not Complete Comments Palliative consult not requested by physician  Medication Review (RN Care Manager) Referral to Pharmacy  Some recent data might be hidden

## 2020-02-17 NOTE — Progress Notes (Signed)
Occupational Therapy Treatment Patient Details Name: Jimmy [name] MRN: [id number] DOB: August 17, 1946 Today's Date: 02/17/2020    History of present illness 74 year old male admitted with COVID/PNA; PMH includes bladder cancer- ileus conduit with urinary diversion. Has OSA but generally does not use his CPAP. Has had multiple surgeries- including for bladder cancer- most recent Dec 2020   OT comments  Patient supine in bed on arrival.  Patient not oriented and has difficulty attending.  Does not consistently respond and speaks in a barely audible whisper.  He did notify therapist of needing to have a bowel movement and wanted to go to [facility].  Required max assist to get to EOB, and then min assist to maintain seated balance with bilateral UE support. Patient not strong enough to complete transfer to [facility], also stated he felt he needed to lay back down and had already had bowel movement.  HR increased to 135 when at EOB.   Required assist of 2 with bed mobility, toileting hygiene and LE bath.  Will continue to follow with OT acutely to address the deficits listed below.    Follow Up Recommendations  Supervision/Assistance - 24 hour;SNF    Equipment Recommendations  3 in 1 bedside commode    Recommendations for Other Services      Precautions / Restrictions Precautions Precautions: Fall       Mobility Bed Mobility Overal bed mobility: Needs Assistance Bed Mobility: Rolling;Supine to Sit;Sit to Supine Rolling: Max assist   Supine to sit: Max assist Sit to supine: Mod assist      Transfers                      Balance Overall balance assessment: Needs assistance Sitting-balance support: Feet supported;Bilateral upper extremity supported Sitting balance-Leahy Scale: Poor                                     ADL either performed or assessed with clinical judgement   ADL Overall ADL's : Needs assistance/impaired Eating/Feeding: Bed level;Minimal assistance    Grooming: Minimal assistance;Bed level       Lower Body Bathing: Maximal assistance;Bed level   Upper Body Dressing : Moderate assistance;Bed level                   Functional mobility during ADLs: Maximal assistance General ADL Comments: Only able to get ot EOB     Vision       Perception     Praxis      Cognition Arousal/Alertness: Lethargic Behavior During Therapy: Flat affect;Anxious Overall Cognitive Status: Impaired/Different from baseline Area of Impairment: Orientation;Attention;Memory;Following commands;Safety/judgement;Awareness;Problem solving                 Orientation Level: Disoriented to;Place;Time;Situation Current Attention Level: Selective Memory: Decreased short-term memory Following Commands: Follows one step commands with increased time Safety/Judgement: Decreased awareness of safety;Decreased awareness of deficits Awareness: Emergent Problem Solving: Slow processing;Decreased initiation;Difficulty sequencing;Requires verbal cues;Requires tactile cues          Exercises     Shoulder Instructions       General Comments      Pertinent Vitals/ Pain       Pain Assessment: Faces Faces Pain Scale: Hurts little more Pain Location: general discomfort  Home Living  Prior Functioning/Environment              Frequency  Min 3X/week        Progress Toward Goals  OT Goals(current goals can now be found in the care plan section)  Progress towards OT goals: Not progressing toward goals - comment(Patient declining)  Acute Rehab OT Goals Patient Stated Goal: none stated OT Goal Formulation: Patient unable to participate in goal setting Time For Goal Achievement: 02/25/20 Potential to Achieve Goals: [place] Discharge plan remains appropriate    Co-evaluation                 AM-PAC OT "6 Clicks" Daily Activity     Outcome Measure   Help from  another person eating meals?: A Little Help from another person taking care of personal grooming?: A Little Help from another person toileting, which includes using toliet, bedpan, or urinal?: Total Help from another person bathing (including washing, rinsing, drying)?: A Lot Help from another person to put on and taking off regular upper body clothing?: A Lot Help from another person to put on and taking off regular lower body clothing?: A Lot 6 Click Score: 13    End of Session Equipment Utilized During Treatment: Oxygen  OT Visit Diagnosis: Unsteadiness on feet (R26.81);Other abnormalities of gait and mobility (R26.89);Muscle weakness (generalized) (M62.81);Other symptoms and signs involving cognitive function;Pain   Activity Tolerance Patient limited by lethargy;Patient limited by fatigue   Patient Left in bed;with call bell/phone within reach;with nursing/sitter in room;with bed alarm set   Nurse Communication Mobility status        Time: 1232-1310 OT Time Calculation (min): 38 min  Charges: OT General Charges $OT Visit: 1 Visit OT Treatments $Self Care/Home Management : 38-52 mins  August Luz, OTR/L    Phylliss Bob 02/17/2020, 4:14 PM

## 2020-02-18 LAB — PHOSPHORUS: Phosphorus: 3.6 mg/dL (ref 2.5–4.6)

## 2020-02-18 LAB — C-REACTIVE PROTEIN: CRP: 1.4 mg/dL — ABNORMAL HIGH (ref <1.0)

## 2020-02-18 LAB — CBC WITH DIFFERENTIAL/PLATELET
Abs Immature Granulocytes: 0.09 10*3/uL — ABNORMAL HIGH (ref 0.00–0.07)
Basophils Absolute: 0 10*3/uL (ref 0.0–0.1)
Basophils Relative: 0 %
Eosinophils Absolute: 0 10*3/uL (ref 0.0–0.5)
Eosinophils Relative: 0 %
HCT: 39.8 % (ref 39.0–52.0)
Hemoglobin: 12.1 g/dL — ABNORMAL LOW (ref 13.0–17.0)
Immature Granulocytes: 2 %
Lymphocytes Relative: 5 %
Lymphs Abs: 0.3 10*3/uL — ABNORMAL LOW (ref 0.7–4.0)
MCH: 28.1 pg (ref 26.0–34.0)
MCHC: 30.4 g/dL (ref 30.0–36.0)
MCV: 92.3 fL (ref 80.0–100.0)
Monocytes Absolute: 0.4 10*3/uL (ref 0.1–1.0)
Monocytes Relative: 7 %
Neutro Abs: 4.7 10*3/uL (ref 1.7–7.7)
Neutrophils Relative %: 86 %
Platelets: 164 10*3/uL (ref 150–400)
RBC: 4.31 MIL/uL (ref 4.22–5.81)
RDW: 16.3 % — ABNORMAL HIGH (ref 11.5–15.5)
WBC: 5.4 10*3/uL (ref 4.0–10.5)
nRBC: 0 % (ref 0.0–0.2)

## 2020-02-18 LAB — COMPREHENSIVE METABOLIC PANEL
ALT: 62 U/L — ABNORMAL HIGH (ref 0–44)
AST: 37 U/L (ref 15–41)
Albumin: 3 g/dL — ABNORMAL LOW (ref 3.5–5.0)
Alkaline Phosphatase: 118 U/L (ref 38–126)
Anion gap: 11 (ref 5–15)
BUN: 74 mg/dL — ABNORMAL HIGH (ref 8–23)
CO2: 23 mmol/L (ref 22–32)
Calcium: 9.5 mg/dL (ref 8.9–10.3)
Chloride: 116 mmol/L — ABNORMAL HIGH (ref 98–111)
Creatinine, Ser: 1.54 mg/dL — ABNORMAL HIGH (ref 0.61–1.24)
GFR calc Af Amer: 51 mL/min — ABNORMAL LOW (ref >60)
GFR calc non Af Amer: 44 mL/min — ABNORMAL LOW (ref >60)
Glucose, Bld: 343 mg/dL — ABNORMAL HIGH (ref 70–99)
Potassium: 4.7 mmol/L (ref 3.5–5.1)
Sodium: 150 mmol/L — ABNORMAL HIGH (ref 135–145)
Total Bilirubin: 0.5 mg/dL (ref 0.3–1.2)
Total Protein: 6.9 g/dL (ref 6.5–8.1)

## 2020-02-18 LAB — MAGNESIUM: Magnesium: 2.5 mg/dL — ABNORMAL HIGH (ref 1.7–2.4)

## 2020-02-18 LAB — FERRITIN: Ferritin: 34 ng/mL (ref 24–336)

## 2020-02-18 LAB — D-DIMER, QUANTITATIVE: D-Dimer, Quant: 1.33 ug/mL-FEU — ABNORMAL HIGH (ref 0.00–0.50)

## 2020-02-18 MED ORDER — INSULIN ASPART 100 UNIT/ML ~~LOC~~ SOLN
5.0000 [IU] | Freq: Once | SUBCUTANEOUS | Status: AC
  Administered 2020-02-18: 13:00:00 5 [IU] via SUBCUTANEOUS

## 2020-02-18 MED ORDER — METOPROLOL TARTRATE 25 MG PO TABS
12.5000 mg | ORAL_TABLET | Freq: Two times a day (BID) | ORAL | Status: DC
  Administered 2020-02-18 – 2020-02-21 (×7): 12.5 mg via ORAL
  Filled 2020-02-18 (×7): qty 1

## 2020-02-18 MED ORDER — INSULIN DETEMIR 100 UNIT/ML ~~LOC~~ SOLN
16.0000 [IU] | Freq: Two times a day (BID) | SUBCUTANEOUS | Status: DC
  Administered 2020-02-18 – 2020-02-21 (×6): 16 [IU] via SUBCUTANEOUS
  Filled 2020-02-18 (×7): qty 0.16

## 2020-02-18 MED ORDER — INSULIN ASPART 100 UNIT/ML ~~LOC~~ SOLN
10.0000 [IU] | Freq: Three times a day (TID) | SUBCUTANEOUS | Status: DC
  Administered 2020-02-18 – 2020-02-21 (×8): 10 [IU] via SUBCUTANEOUS

## 2020-02-18 NOTE — Progress Notes (Signed)
   02/18/20 1000  Family/Significant Other Communication  Family/Significant Other Update Called;Updated   Called and updated wife on pt status and Plan of Care. All questions asked and all questions answered.

## 2020-02-18 NOTE — Progress Notes (Signed)
Physical Therapy Treatment Patient Details Name: Jimmy [name] MRN: [id number] DOB: 10/18/1946 Today's Date: 02/18/2020    History of Present Illness 74 year old male admitted with COVID/PNA, Other dx include L LE DVT, lower GIB with suspected ischemic colitis, metabolic encepholopathy, hyperkalemia, metabolic acidosis, hypernatremia.  CT head negative for stroke.PMH includes bladder cancer- ileus conduit with urinary diversion. Has OSA but generally does not use his CPAP. Has had multiple surgeries- including for bladder cancer- most recent Dec 2020    PT Comments    Ed remains flat, slow to process, generally confused (also HOH).  He was cooperative with movement to EOB and standing side steps with two person assist.  Vitals as follows on 2 L O2 [place]: O2 sats 87-90% on 2 L O2 [place] via nellcor finger probe (earlobe probe that was on him when we entered was not working, so removed), HR 120-130s at rest and during mobility.  RR 30s and BPs mildly elevated, but stable.  Attempted breathing exercises, but pt could only understand how to do flutter valve.  He remains appropriate for SNF level rehab at discharge and continued acute PT.  PT will continue to follow acutely for safe mobility progression   Follow Up Recommendations  SNF     Equipment Recommendations  3in1 (PT);Wheelchair (measurements PT);Wheelchair cushion (measurements PT);Hospital bed    Recommendations for Other Services   NA     Precautions / Restrictions Precautions Precautions: Fall Precaution Comments: monitor vitals, O2 and HR    Mobility  Bed Mobility Overal bed mobility: Needs Assistance Bed Mobility: Rolling;Supine to Sit;Sit to Supine Rolling: Mod assist;+2 for physical assistance   Supine to sit: Mod assist;+2 for physical assistance;HOB elevated Sit to supine: Mod assist;+2 for physical assistance;HOB elevated   General bed mobility comments: Two person mod assist to move to EOB from maximally elevated HOB,  hand over hand to reach and pull on railing and assist at trunk and bil LEs to progress fully to EOB.   Transfers Overall transfer level: Needs assistance Equipment used: 2 person hand held assist Transfers: Sit to/from Stand Sit to Stand: +2 physical assistance;Mod assist         General transfer comment: Two person mod assist to stand from bed and take 1 side step up to [facility], bil LEs buckling with stepping sideways.  Pt verbalizing wanting to return to bed, so positioned in fully upright chair mode in bed to try to simulate being in chair.          Balance Overall balance assessment: Needs assistance Sitting-balance support: Feet supported;Bilateral upper extremity supported Sitting balance-Leahy Scale: Poor Sitting balance - Comments: min assist in sitting EOB to maintain midline posture Postural control: Right lateral lean Standing balance support: Bilateral upper extremity supported Standing balance-Leahy Scale: Poor Standing balance comment: needs external assist in standing.                             Cognition Arousal/Alertness: Awake/alert Behavior During Therapy: Flat affect;Anxious Overall Cognitive Status: Impaired/Different from baseline Area of Impairment: Orientation;Attention;Memory;Following commands;Safety/judgement;Awareness;Problem solving                 Orientation Level: Disoriented to;Place;Time;Situation Current Attention Level: Sustained Memory: Decreased short-term memory Following Commands: Follows one step commands with increased time Safety/Judgement: Decreased awareness of safety;Decreased awareness of deficits Awareness: Intellectual Problem Solving: Slow processing;Decreased initiation;Difficulty sequencing;Requires verbal cues;Requires tactile cues General Comments: Pt remains flat, slow to process,  difficulty initiating movement towards EOB without step by step and hand over hand instruction.        Exercises Other  Exercises Other Exercises: attempted incentive spirometer and despite several different verbal cues, I could not get him to inhale, he kept exhaling.  flutter valve ordered, but not in room, so go flutter valve which he did well with x 10 reps.  Will re-attempt incentive spirometer at a later date.     General Comments General comments (skin integrity, edema, etc.): O2 sats dipping to 87% on 2 L O2 [place], HR 130s, RR 30s, BP mildly elevated.  Pt having small, loose bowel movements.       Pertinent Vitals/Pain Pain Assessment: Faces Faces Pain Scale: Hurts little more Pain Location: general discomfort Pain Descriptors / Indicators: Grimacing;Guarding Pain Intervention(s): Limited activity within patient's tolerance;Monitored during session;Repositioned           PT Goals (current goals can now be found in the care plan section) Acute Rehab PT Goals Patient Stated Goal: none stated Progress towards PT goals: Progressing toward goals    Frequency    Min 2X/week      PT Plan Frequency needs to be updated       AM-PAC PT "6 Clicks" Mobility   Outcome Measure  Help needed turning from your back to your side while in a flat bed without using bedrails?: A Lot Help needed moving from lying on your back to sitting on the side of a flat bed without using bedrails?: A Lot Help needed moving to and from a bed to a chair (including a wheelchair)?: A Lot Help needed standing up from a chair using your arms (e.g., wheelchair or bedside chair)?: A Lot Help needed to walk in hospital room?: Total Help needed climbing 3-5 steps with a railing? : Total 6 Click Score: 10    End of Session Equipment Utilized During Treatment: Oxygen Activity Tolerance: Other (comment)(limited by cognition. ) Patient left: in bed;with call bell/phone within reach;with bed alarm set   PT Visit Diagnosis: Unsteadiness on feet (R26.81);Other abnormalities of gait and mobility (R26.89);Muscle weakness  (generalized) (M62.81)     Time: 0930-1000 PT Time Calculation (min) (ACUTE ONLY): 30 min  Charges:  $Therapeutic Activity: 23-37 mins                    Verdene Lennert, PT, DPT  Acute Rehabilitation [phone] pager #[phone] office  @ [facility]: [phone]   02/18/2020, 10:16 AM

## 2020-02-18 NOTE — Progress Notes (Signed)
PROGRESS NOTE    Jimmy [name]  [id number] DOB: Jan 14, 1946 DOA: 02/08/2020 PCP: Jimmy Lung, MD   Brief Narrative:  74 year old WM PMHx Dyspnea.  CKD, diabetes type 2 uncontrolled with complication, OSA uses CPAP HTN, HLD, bladder cancer, nephrolithiasis, prostate cancer s/p seed implant + XRT CAD, obesity,  Patient reported not feeling well for several days at home, having dyspnea and cough. Home health nurse noticed him to be more confused having increased oxygen requirements. On his initial physical examination in the emergency department he was in respiratory distress and placed on nonrebreather mask,with oxygen saturation 90%, his blood pressure was 129/79, heart rate 102, respiratory rate28.His lungs were clear to auscultation, no wheezing or rhonchi, heart S1-S2, present with, abdomen was soft and nontender, no lower extremity edema.Urinalysis specific gravity 1.012, 0-5 red cells, 11-20 white cells, large leukocytes. Chest radiograph with left lower lobe interstitial infiltrate, increased bilateral markings. CT chest negative for pulmonary embolism, bilateral apical groundglass opacities bilateral subpleural groundglass infiltrates.  Patient has received supplemental oxygen per high flow nasal cannula, medical therapy with remdesivir and dexamethasone. For persistent hypoxic respiratory failure he received SARS COVID-19 convalescent plasma.  He continued to have high oxygen requirements. Ultrasonography lower extremities was positive for deep vein thrombosis on the left.  Patient responded well to diuresis for non cardiogenic pulmonary edema with improvement of oxygenation.  Urothelial cancer: 06/22/2019. SP TURB with bilateralureteral stentsplacement/final pathology showed high-grade infiltrating papillary adenocarcinoma, invading into the detrusor muscle with lymphovascular invasion. .  08/07/2019 patient underwent neoadjuvant chemotherapy,4 cycles completed  October 16, 2019. 12/2019 underwent radical cystectomy and lymph node dissection.   Final pathology showed 1.5 cm residual tumor with invasion into the perivascular soft tissue. Final pathology stage T38 and 0 with 0 out of 10 lymph nodes involved.Currently under active surveillance.  Patient is very weak and deconditioned, poor oral intake. Poorly responsive. Had bloody stools and anticoagulation was heldfor 24 H.   Enoxaparin have been resumed, but if recurrent bleeding patient may need IVC filter.   Subjective: 2/18 afebrile overnight, alert sitting in bed feeding himself.  Follows commands.   Assessment & Plan:   Principal Problem:   Pneumonia due to COVID-19 virus Active Problems:   Hypertension associated with type 2 diabetes mellitus ([place])   Malignant neoplasm of urinary bladder (HCC)   Acute respiratory failure due to COVID-19 [facility])  Covid pneumonia/acute respiratory failure with hypoxia COVID-19 Labs  Recent Labs    02/17/20 0358 02/18/20 0239  DDIMER 1.48* 1.33*  FERRITIN 37 34  CRP 1.4* 1.4*    Lab Results  Component Value Date   SARSCOV2NAA POSITIVE (A) 02/08/2020   SARSCOV2NAA NEGATIVE 12/23/2019   SARSCOV2NAA NOT DETECTED 12/07/2019   SARSCOV2NAA NEGATIVE 06/18/2019   -Decadron 6 mg daily -Remdesivir completed course -Plan for SNF at discharge.   Awaiting insurance clearance.  Acute LEFT lower extremity DVT/complicated by NEW bloody stools. -Continue full dose anticoagulation.  Continue to monitor patient's hemoglobin. -If signs of rebleed will require IVC filter  Lower GI bleed/suspected ischemic colitis secondary to Covid Recent Labs  Lab 02/14/20 0900 02/15/20 0304 02/16/20 0431 02/17/20 0358 02/18/20 0239  HGB 11.8* 11.5* 12.2* 12.4* 12.1*  -H/H stable -Continue Protonix 40 mg BID  Essential HTN/tachycardia -2/18 Metoprolol [id number] mg BID  Metabolic encephalopathy -Unsure if patient baseline but appears to be improving.   Nodding yes and no appropriately to questions.  Feeding himself.  Following commands.  Still nonverbal, although RN states did speak with wife.  High-grade urothelial carcinoma of the bladder -Positive pyuria, negative urinary symptoms. -Culture positive coag negative staph most likely contaminant  UTI? -urine positive Aerococcus Urinae/staph Epidermidis, most likely contaminant, with negative fever, negative leukocytosis, most likely contaminant.  Therefore UTI ruled out  -Monitor closely for signs and symptoms  Acute on CKD stage IIIa (baseline Cr 1.39)  Recent Labs  Lab 02/14/20 0050 02/15/20 0304 02/16/20 0431 02/17/20 0358 02/18/20 0239  CREATININE 1.48* 1.38* 1.53* 1.39* 1.54*  -2/18 increase  D5W 150ml/hr  Diabetes type 2controlled with complication  -2/9 Hemoglobin A1c = 6.0  -2/18 increase Levemir 16 units BID -2/18 increase NovoLog 10 units qac -Moderate SSI -DC Ensure  Unspecified protein calorie malnutrition -Encourage patient to eat   Hypernatremia -See CKD  Extreme weakness -Per RN yesterday patient able to ambulate from bed to chair, today barely able to raise arms and legs.   -2/17 CT head negative for acute hemorrhage or infarct see results below   Goals of care -2/17 PT/OT recommend SNF    DVT prophylaxis: Lovenox therapeutic dose Code Status: Full Family Communication:  Disposition Plan: Discharge most likely on Saturday 2/20   Consultants:    Procedures/Significant Events:  2/17 CT head Wo contrast; negative acute intracranial hemorrhage.  Negative acute infarction      I have personally reviewed and interpreted all radiology studies and my findings are as above.  VENTILATOR SETTINGS: Nasal cannula 2/18 Flow; 2 L/min SPO2 98%   Cultures 2/8 urine positive Aerococcus Urinae/staph Epidermidis 2/8 SARS coronavirus 2 positive    Antimicrobials: Anti-infectives (From admission, onward)   Start     Dose/Rate Stop   02/09/20  1000  remdesivir 100 mg in sodium chloride 0.9 % 100 mL IVPB     100 mg 200 mL/hr over 30 Minutes 02/12/20 1829   02/09/20 0800  cefTRIAXone (ROCEPHIN) 1 g in sodium chloride 0.9 % 100 mL IVPB  Status:  Discontinued     1 g 200 mL/hr over 30 Minutes 02/10/20 1044   02/08/20 2200  remdesivir 200 mg in sodium chloride 0.9% 250 mL IVPB     200 mg 580 mL/hr over 30 Minutes 02/08/20 2246       Devices    LINES / TUBES:      Continuous Infusions: . sodium chloride 50 mL/hr at 02/16/20 1506  . sodium chloride    . dextrose 75 mL/hr at 02/18/20 0551     Objective: Vitals:   02/18/20 0033 02/18/20 0400 02/18/20 0523 02/18/20 0707  BP: (!) 141/88   136/90  Pulse:  92  100  Resp:  15  20  Temp: (!) 97.4 F (36.3 C)  (!) 97.1 F (36.2 C) (!) 96.9 F (36.1 C)  TempSrc: Axillary  Axillary Axillary  SpO2:  98%  95%  Weight:      Height:        Intake/Output Summary (Last 24 hours) at 02/18/2020 0910 Last data filed at 02/18/2020 0700 Gross per 24 hour  Intake 1109.32 ml  Output 2302 ml  Net -1192.68 ml   Filed Weights   02/08/20 2230 02/15/20 1032 02/17/20 0536  Weight: (!) 185.7 kg 85.6 kg 83.7 kg   Physical Exam:  General: Alert, follows commands, positive acute respiratory distress Eyes: negative scleral hemorrhage, negative anisocoria, negative icterus ENT: Negative Runny nose, negative gingival bleeding, Neck:  Negative scars, masses, torticollis, lymphadenopathy, JVD Lungs: Clear to auscultation bilaterally without wheezes or crackles Cardiovascular: Tachycardic, without murmur gallop or rub normal S1 and  S2 Abdomen: negative abdominal pain, nondistended, positive soft, bowel sounds, no rebound, no ascites, no appreciable mass Extremities: No significant cyanosis, clubbing, or edema bilateral lower extremities Skin: Negative rashes, lesions, ulcers Psychiatric:  Negative depression, negative anxiety, negative fatigue, negative mania  Central nervous system:   Cranial nerves II through XII intact, tongue/uvula midline, all extremities muscle strength 3-4/5, sensation intact throughout, nonverbal (RN states did speak with wife), nods yes and no appropriately to questions.  Follows commands.   .     Data Reviewed: Care during the described time interval was provided by me .  I have reviewed this patient's available data, including medical history, events of note, physical examination, and all test results as part of my evaluation.   CBC: Recent Labs  Lab 02/13/20 0040 02/13/20 0040 02/14/20 0900 02/15/20 0304 02/16/20 0431 02/17/20 0358 02/18/20 0239  WBC 6.5  --   --  6.9 9.0 7.9 5.4  NEUTROABS  --   --   --  6.2 8.2* 7.0 4.7  HGB 11.6*   < > 11.8* 11.5* 12.2* 12.4* 12.1*  HCT 37.4*   < > 38.2* 36.9* 39.1 39.9 39.8  MCV 91.9  --   --  90.9 90.9 91.7 92.3  PLT 235  --   --  217 230 177 164   < > = values in this interval not displayed.   Basic Metabolic Panel: Recent Labs  Lab 02/14/20 0050 02/15/20 0304 02/16/20 0431 02/17/20 0358 02/18/20 0239  NA 142 145 145 151* 150*  K 4.7 5.0 5.1 4.5 4.7  CL 114* 114* 113* 117* 116*  CO2 18* 21* 21* 23 23  GLUCOSE 319* 241* 356* 267* 343*  BUN 66* 63* 66* 68* 74*  CREATININE 1.48* 1.38* 1.53* 1.39* 1.54*  CALCIUM 9.3 9.4 9.5 9.7 9.5  MG  --   --  2.4 2.3 2.5*  PHOS  --   --   --  3.8 3.6   GFR: Estimated Creatinine Clearance: 45 mL/min (A) (by C-G formula based on SCr of 1.54 mg/dL (H)). Liver Function Tests: Recent Labs  Lab 02/12/20 0142 02/13/20 0040 02/14/20 0050 02/18/20 0239  AST 105* 98* 73* 37  ALT 65* 76* 72* 62*  ALKPHOS 160* 150* 139* 118  BILITOT 0.8 0.5 0.8 0.5  PROT 7.5 7.0 7.2 6.9  ALBUMIN 3.1* 3.1* 3.0* 3.0*   No results for input(s): LIPASE, AMYLASE in the last 168 hours. No results for input(s): AMMONIA in the last 168 hours. Coagulation Profile: No results for input(s): INR, PROTIME in the last 168 hours. Cardiac Enzymes: No results for input(s):  CKTOTAL, CKMB, CKMBINDEX, TROPONINI in the last 168 hours. BNP (last 3 results) No results for input(s): PROBNP in the last 8760 hours. HbA1C: No results for input(s): HGBA1C in the last 72 hours. CBG: Recent Labs  Lab 02/16/20 0706 02/16/20 0724 02/16/20 1203 02/16/20 1520 02/16/20 2106  GLUCAP 267* 281* 367* 200* 323*   Lipid Profile: No results for input(s): CHOL, HDL, LDLCALC, TRIG, CHOLHDL, LDLDIRECT in the last 72 hours. Thyroid Function Tests: No results for input(s): TSH, T4TOTAL, FREET4, T3FREE, THYROIDAB in the last 72 hours. Anemia Panel: Recent Labs    02/17/20 0358 02/18/20 0239  FERRITIN 37 34   Urine analysis:    Component Value Date/Time   COLORURINE YELLOW 02/08/2020 1545   APPEARANCEUR CLOUDY (A) 02/08/2020 1545   LABSPEC 1.012 02/08/2020 1545   PHURINE 6.0 02/08/2020 1545   GLUCOSEU NEGATIVE 02/08/2020 1545   HGBUR NEGATIVE  02/08/2020 [place] 02/08/2020 1545   BILIRUBINUR n 06/08/2016 1109   [place] 02/08/2020 1545   PROTEINUR NEGATIVE 02/08/2020 1545   UROBILINOGEN negative 06/08/2016 1109   NITRITE NEGATIVE 02/08/2020 1545   LEUKOCYTESUR LARGE (A) 02/08/2020 1545   Sepsis Labs: @LABRCNTIP (procalcitonin:4,lacticidven:4)  ) Recent Results (from the past 240 hour(s))  Respiratory Panel by RT PCR (Flu A&B, Covid) - Nasopharyngeal Swab     Status: Abnormal   Collection Time: 02/08/20  3:14 PM   Specimen: Nasopharyngeal Swab  Result Value Ref Range Status   SARS Coronavirus 2 by RT PCR POSITIVE (A) NEGATIVE Final    Comment: RESULT CALLED TO, READ BACK BY AND VERIFIED WITH: HODGES,I. RN @2010  ON 02.08.2021 BY COHEN,K (NOTE) SARS-CoV-2 target nucleic acids are DETECTED. SARS-CoV-2 RNA is generally detectable in upper respiratory specimens  during the acute phase of infection. Positive results are indicative of the presence of the identified virus, but do not rule out bacterial infection or co-infection with other  pathogens not detected by the test. Clinical correlation with patient history and other diagnostic information is necessary to determine patient infection status. The expected result is Negative. Fact Sheet for Patients:  PinkCheek.be Fact Sheet for Healthcare Providers: GravelBags.it This test is not yet approved or cleared by the [place] FDA and  has been authorized for detection and/or diagnosis of SARS-CoV-2 by FDA under an Emergency Use Authorization (EUA).  This EUA will remain in effect (meaning this test can be  used) for the duration of  the COVID-19 declaration under Section 564(b)(1) of the Act, 21 U.S.C. section 360bbb-3(b)(1), unless the authorization is terminated or revoked sooner.    Influenza A by PCR NEGATIVE NEGATIVE Final   Influenza B by PCR NEGATIVE NEGATIVE Final    Comment: (NOTE) The Xpert Xpress SARS-CoV-2/FLU/RSV assay is intended as an aid in  the diagnosis of influenza from Nasopharyngeal swab specimens and  should not be used as a sole basis for treatment. Nasal washings and  aspirates are unacceptable for Xpert Xpress SARS-CoV-2/FLU/RSV  testing. Fact Sheet for Patients: PinkCheek.be Fact Sheet for Healthcare Providers: GravelBags.it This test is not yet approved or cleared by the [place] FDA and  has been authorized for detection and/or diagnosis of SARS-CoV-2 by  FDA under an Emergency Use Authorization (EUA). This EUA will remain  in effect (meaning this test can be used) for the duration of the  Covid-19 declaration under Section 564(b)(1) of the Act, 21  U.S.C. section 360bbb-3(b)(1), unless the authorization is  terminated or revoked. Performed at [facility], [place] [address] [place], [place] [address]   Urine culture     Status: Abnormal   Collection Time: 02/08/20  3:45 PM   Specimen: Urine,  Random  Result Value Ref Range Status   Specimen Description   Final    URINE, RANDOM Performed at [facility] [address] [place], [place] [address]    Special Requests   Final    NONE Performed at [facility], [place] [address] [place], [place] [address]    Culture (A)  Final    >=100,000 COLONIES/mL AEROCOCCUS URINAE 80,000 COLONIES/mL STAPHYLOCOCCUS EPIDERMIDIS ORGANISM 1 Standardized susceptibility testing for this organism is not available. Performed at [facility], [place] [address] [place],  [address]    Report Status 02/11/2020 FINAL  Final   Organism ID, Bacteria STAPHYLOCOCCUS EPIDERMIDIS (A)  Final      Susceptibility  Staphylococcus epidermidis - MIC*    CIPROFLOXACIN <=0.5 SENSITIVE Sensitive     GENTAMICIN <=0.5 SENSITIVE Sensitive     NITROFURANTOIN <=16 SENSITIVE Sensitive     OXACILLIN >=4 RESISTANT Resistant     TETRACYCLINE <=1 SENSITIVE Sensitive     VANCOMYCIN 1 SENSITIVE Sensitive     TRIMETH/SULFA 160 RESISTANT Resistant     CLINDAMYCIN <=0.25 SENSITIVE Sensitive     RIFAMPIN <=0.5 SENSITIVE Sensitive     Inducible Clindamycin NEGATIVE Sensitive     * 80,000 COLONIES/mL STAPHYLOCOCCUS EPIDERMIDIS         Radiology Studies: CT HEAD WO CONTRAST  Result Date: 02/17/2020 CLINICAL DATA:  Ataxia EXAM: CT HEAD WITHOUT CONTRAST TECHNIQUE: Contiguous axial images were obtained from the base of the skull through the vertex without intravenous contrast. COMPARISON:  02/08/2020 FINDINGS: Brain: There is no acute intracranial hemorrhage, mass-effect, or edema. There is no new loss of gray-white differentiation. There is no extra-axial fluid collection. Ventricles and sulci are within normal limits in size and configuration. Patchy hypoattenuation the supratentorial white matter is nonspecific but may reflect mild chronic microvascular ischemic changes. Vascular: No hyperdense vessel or unexpected calcification.  Skull: Calvarium is unremarkable. Sinuses/Orbits: No acute finding. Other: None. IMPRESSION: No acute intracranial hemorrhage, mass effect, or evidence of acute infarction. Electronically Signed   By: Macy Mis M.D.   On: 02/17/2020 14:29   DG Swallowing Func-Speech Pathology  Result Date: 02/16/2020 Objective Swallowing Evaluation: Type of Study: MBS-Modified Barium Swallow Study  Patient Details Name: SHIZUO GIACHETTI MRN: [id number] Date of Birth: 09-Sep-1946 Today's Date: 02/16/2020 Time: SLP Start Time (ACUTE ONLY): 0930 -SLP Stop Time (ACUTE ONLY): 1015 SLP Time Calculation (min) (ACUTE ONLY): 45 min Past Medical History: Past Medical History: Diagnosis Date . Astigmatism of both eyes 11/01/2016 . Bronchitis  . Coronary artery disease  . Cortical age-related cataract of both eyes 11/01/2016 . Diabetes mellitus without complication ([place])   type 2 . Drug-induced osteoporosis  . Dyslipidemia  . Fatigue  . Fatty liver  . History of kidney stones  . Hypercholesteremia  . Hypogonadism male  . Ischemic heart disease   s/p cath in 2000 showing nonobstructive CAD yet felt to have had a probable dissection of the RCA . Normal nuclear stress test March 2011 . Nuclear sclerotic cataract of both eyes 11/01/2016 . Obesity  . Prostate cancer [facility])   s/p seed implant and radiation . Sleep apnea   90% of time dosent wear his cpap per pt . Vitamin D insufficiency  Past Surgical History: Past Surgical History: Procedure Laterality Date . BUBBLE STUDY  12/30/2019  Procedure: BUBBLE STUDY;  Surgeon: Pixie Casino, MD;  Location: [facility];  Service: Cardiovascular;; . CARDIAC CATHETERIZATION  April 2000 . IR IMAGING GUIDED PORT INSERTION  07/30/2019 . IR REMOVAL TUN ACCESS W/ PORT W/O FL MOD SED  12/26/2019 . RADIOACTIVE SEED IMPLANT  2009  Implanted radiation seeds . TEE WITHOUT CARDIOVERSION N/A 12/30/2019  Procedure: TRANSESOPHAGEAL ECHOCARDIOGRAM (TEE);  Surgeon: Pixie Casino, MD;  Location: [facility] ENDOSCOPY;   Service: Cardiovascular;  Laterality: N/A; . thumb surgery    as a teen caught thumb in machine . TRANSURETHRAL RESECTION OF BLADDER TUMOR N/A 06/22/2019  Procedure: TRANSURETHRAL RESECTION OF BLADDER TUMOR (TURBT), CYSTOSCOPY,  BILATERAL RETROGRADE PYELOGRAPHY, BILATERAL STENT PLACEMENT;  Surgeon: Raynelle Bring, MD;  Location: [facility] ORS;  Service: Urology;  Laterality: N/A;  GENERAL WITH PARALYSIS HPI: 74 year old male admitted with COVID/PNA; PMH bladder cancer, OSA, DM, bronchitis, CAD. OT  witnessed pt coughing with 3/4 sips water.  No data recorded Assessment / Plan / Recommendation CHL IP CLINICAL IMPRESSIONS 02/16/2020 Clinical Impression Pt demonstrates moderate oral impairment including failure to fully form a bolus with a thrusting machanism to transit poteriorally, leading to consistent premature spillage to pyriform sinuses. Pt has a posterior head tilt and even with total assist could barely bring head to neutral during study. This also contributes to direct spillage to liquids to the pyriform sinuses. If the bolus is large, there is penetration and even aspiration with large thin boluses. Pt senses significant volume aspiration, but cough is not strong enough to expectorate. There is also mild to moderate pharyngeal residue due both to weak pharyngeal stripping as well as suspected dry pharyngeal mucosa. Recommend continuing current diet of dys 2 (observed to do well at meal, but would not masticate solids in the study) with nectar thick liquids via small cup sip or spoon only, no straws. Pt may benefit from pharyngeal strengthing, exercises to target cough strength and also improved neck ROM. Will f/u for interventions.  SLP Visit Diagnosis Dysphagia, oropharyngeal phase (R13.12) Attention and concentration deficit following -- Frontal lobe and executive function deficit following -- Impact on safety and function Moderate aspiration risk   CHL IP TREATMENT RECOMMENDATION 02/16/2020 Treatment  Recommendations Therapy as outlined in treatment plan below   Prognosis 02/16/2020 Prognosis for Safe Diet Advancement Fair Barriers to Reach Goals -- Barriers/Prognosis Comment -- CHL IP DIET RECOMMENDATION 02/16/2020 SLP Diet Recommendations Dysphagia 2 (Fine chop) solids;Nectar thick liquid Liquid Administration via Cup;Spoon;No straw Medication Administration Whole meds with puree Compensations Slow rate;Small sips/bites;Clear throat intermittently Postural Changes Seated upright at 90 degrees   CHL IP OTHER RECOMMENDATIONS 02/16/2020 Recommended Consults -- Oral Care Recommendations Oral care BID Other Recommendations Have oral suction available   CHL IP FOLLOW UP RECOMMENDATIONS 02/16/2020 Follow up Recommendations Skilled Nursing facility   [facility] IP FREQUENCY AND DURATION 02/16/2020 Speech Therapy Frequency (ACUTE ONLY) min 2x/week Treatment Duration 2 weeks      CHL IP ORAL PHASE 02/16/2020 Oral Phase Impaired Oral - Pudding Teaspoon -- Oral - Pudding Cup -- Oral - Honey Teaspoon -- Oral - Honey Cup -- Oral - Nectar Teaspoon Premature spillage;Decreased bolus cohesion Oral - Nectar Cup Premature spillage;Decreased bolus cohesion Oral - Nectar Straw Premature spillage;Decreased bolus cohesion Oral - Thin Teaspoon Premature spillage;Decreased bolus cohesion Oral - Thin Cup Premature spillage;Decreased bolus cohesion Oral - Thin Straw NT Oral - Puree Decreased bolus cohesion Oral - Mech Soft -- Oral - Regular Other (Comment) Oral - Multi-Consistency -- Oral - Pill -- Oral Phase - Comment --  CHL IP PHARYNGEAL PHASE 02/16/2020 Pharyngeal Phase Impaired Pharyngeal- Pudding Teaspoon -- Pharyngeal -- Pharyngeal- Pudding Cup -- Pharyngeal -- Pharyngeal- Honey Teaspoon -- Pharyngeal -- Pharyngeal- Honey Cup -- Pharyngeal -- Pharyngeal- Nectar Teaspoon Delayed swallow initiation-pyriform sinuses;Reduced pharyngeal peristalsis;Pharyngeal residue - valleculae;Pharyngeal residue - pyriform Pharyngeal -- Pharyngeal- Nectar Cup  Delayed swallow initiation-pyriform sinuses;Reduced pharyngeal peristalsis;Pharyngeal residue - valleculae;Pharyngeal residue - pyriform;Penetration/Aspiration during swallow;Penetration/Apiration after swallow;Trace aspiration Pharyngeal Material enters airway, remains ABOVE vocal cords and not ejected out Pharyngeal- Nectar Straw Delayed swallow initiation-pyriform sinuses;Reduced pharyngeal peristalsis;Pharyngeal residue - valleculae;Pharyngeal residue - pyriform;Penetration/Aspiration during swallow;Penetration/Apiration after swallow;Trace aspiration Pharyngeal Material enters airway, passes BELOW cords without attempt by patient to eject out (silent aspiration) Pharyngeal- Thin Teaspoon Delayed swallow initiation-pyriform sinuses;Reduced pharyngeal peristalsis;Pharyngeal residue - valleculae;Pharyngeal residue - pyriform;Penetration/Aspiration during swallow;Penetration/Apiration after swallow;Trace aspiration Pharyngeal Material enters airway, passes BELOW cords without attempt by patient  to eject out (silent aspiration) Pharyngeal- Thin Cup Delayed swallow initiation-pyriform sinuses;Reduced pharyngeal peristalsis;Pharyngeal residue - valleculae;Pharyngeal residue - pyriform;Penetration/Aspiration during swallow;Penetration/Apiration after swallow;Trace aspiration Pharyngeal Material enters airway, passes BELOW cords and not ejected out despite cough attempt by patient Pharyngeal- Thin Straw -- Pharyngeal -- Pharyngeal- Puree -- Pharyngeal -- Pharyngeal- Mechanical Soft -- Pharyngeal -- Pharyngeal- Regular -- Pharyngeal -- Pharyngeal- Multi-consistency -- Pharyngeal -- Pharyngeal- Pill -- Pharyngeal -- Pharyngeal Comment --  CHL IP CERVICAL ESOPHAGEAL PHASE 02/16/2020 Cervical Esophageal Phase WFL Pudding Teaspoon -- Pudding Cup -- Honey Teaspoon -- Honey Cup -- Nectar Teaspoon -- Nectar Cup -- Nectar Straw -- Thin Teaspoon -- Thin Cup -- Thin Straw -- Puree -- Mechanical Soft -- Regular -- Multi-consistency  -- Pill -- Cervical Esophageal Comment -- DeBlois, Katherene Ponto 02/16/2020, 10:53 AM                   Scheduled Meds: . vitamin C  500 mg Oral Daily  . chlorpheniramine-HYDROcodone  5 mL Oral Q12H  . enoxaparin (LOVENOX) injection  85 mg Subcutaneous Q12H  . feeding supplement (ENSURE ENLIVE)  237 mL Oral TID BM  . insulin aspart  0-5 Units Subcutaneous QHS  . insulin aspart  0-9 Units Subcutaneous TID WC  . insulin aspart  5 Units Subcutaneous TID WC  . insulin detemir  10 Units Subcutaneous BID  . Ipratropium-Albuterol  1 puff Inhalation Q6H  . multivitamin with minerals  1 tablet Oral Daily  . nystatin  5 mL Oral QID  . pantoprazole (PROTONIX) IV  40 mg Intravenous Q12H  . sodium chloride flush  3 mL Intravenous Once  . sodium chloride flush  3 mL Intravenous Q12H  . sodium zirconium cyclosilicate  10 g Oral TID  . thiamine  100 mg Oral Daily  . zinc sulfate  220 mg Oral Daily   Continuous Infusions: . sodium chloride 50 mL/hr at 02/16/20 1506  . sodium chloride    . dextrose 75 mL/hr at 02/18/20 0551     LOS: 10 days   The patient is critically ill with multiple organ systems failure and requires high complexity decision making for assessment and support, frequent evaluation and titration of therapies, application of advanced monitoring technologies and extensive interpretation of multiple databases. Critical Care Time devoted to patient care services described in this note  Time spent: 40 minutes     Saiya Crist, Geraldo Docker, MD [facility] Pager [phone]  If 7PM-7AM, please contact night-coverage [email or web address] Password [email or web address] 02/18/2020, 9:10 AM

## 2020-02-18 NOTE — Progress Notes (Signed)
[facility] for Lovenox Indication: DVT  Allergies  Allergen Reactions  . Ace Inhibitors Cough  . Cephalexin Hives, Diarrhea and Nausea Only    Tolerates Ancef, Rocephin, Cefoxitin  . Crestor [Rosuvastatin Calcium]   . Lipitor [Atorvastatin Calcium] Other (See Comments)    MUSCLE PAINS   . Plavix [Clopidogrel Bisulfate] Hives  . Vancomycin Nausea And Vomiting    Po route caused n/v.     Patient Measurements: Height: 5\' 8"  (172.7 cm) Weight: 184 lb 8.4 oz (83.7 kg) IBW/kg (Calculated) : 68.4 Heparin Dosing Weight: 85.6 kg  Vital Signs: Temp: 98.7 F (37.1 C) (02/18 1125) Temp Source: Oral (02/18 1125) BP: 135/85 (02/18 1125) Pulse Rate: 118 (02/18 1125)  Labs: Recent Labs    02/16/20 0431 02/16/20 0431 02/17/20 0358 02/18/20 0239  HGB 12.2*   < > 12.4* 12.1*  HCT 39.1  --  39.9 39.8  PLT 230  --  177 164  CREATININE 1.53*  --  1.39* 1.54*   < > = values in this interval not displayed.    Estimated Creatinine Clearance: 45 mL/min (A) (by C-G formula based on SCr of 1.54 mg/dL (H)).   Medical History: Past Medical History:  Diagnosis Date  . Astigmatism of both eyes 11/01/2016  . Bronchitis   . Coronary artery disease   . Cortical age-related cataract of both eyes 11/01/2016  . Diabetes mellitus without complication ([place])    type 2  . Drug-induced osteoporosis   . Dyslipidemia   . Fatigue   . Fatty liver   . History of kidney stones   . Hypercholesteremia   . Hypogonadism male   . 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 Normal nuclear stress test March 2011  . Nuclear sclerotic cataract of both eyes 11/01/2016  . Obesity   . Prostate cancer [facility])    s/p seed implant and radiation  . Sleep apnea    90% of time dosent wear his cpap per pt  . Vitamin D insufficiency     Medications:  Scheduled:  . vitamin C  500 mg Oral  Daily  . chlorpheniramine-HYDROcodone  5 mL Oral Q12H  . enoxaparin (LOVENOX) injection  85 mg Subcutaneous Q12H  . feeding supplement (ENSURE ENLIVE)  237 mL Oral TID BM  . insulin aspart  0-5 Units Subcutaneous QHS  . insulin aspart  0-9 Units Subcutaneous TID WC  . insulin aspart  5 Units Subcutaneous TID WC  . insulin detemir  10 Units Subcutaneous BID  . Ipratropium-Albuterol  1 puff Inhalation Q6H  . multivitamin with minerals  1 tablet Oral Daily  . nystatin  5 mL Oral QID  . pantoprazole (PROTONIX) IV  40 mg Intravenous Q12H  . sodium chloride flush  3 mL Intravenous Once  . sodium chloride flush  3 mL Intravenous Q12H  . sodium zirconium cyclosilicate  10 g Oral TID  . thiamine  100 mg Oral Daily  . zinc sulfate  220 mg Oral Daily    Assessment: 74 y/o M admitted with COVID-19 PNA developed LLE DVT complicated by bloody stools. Eliquis stopped 2/11, DVT prophylaxis Lovenox given 2/13.   CBC stable, PLT down some Bloody stools improving on PPI SCr improved, stable; UOP OK  Goal of Therapy:  Anti-Xa level 0.6-1 units/ml 4hrs after LMWH dose given Monitor platelets by anticoagulation protocol: Yes   Plan:  -  Continue Lovenox 85 mg (1 mg/kg) q 12 hours -Monitor renal function, CBC, s/s of bleeding -F/U plans for long-term anticoagulation  Ulice Dash D 02/18/2020,11:52 AM

## 2020-02-18 NOTE — TOC Progression Note (Addendum)
Transition of Care [facility]) - Progression Note    Patient Details  Name: Jimmy [name] MRN: [id number] Date of Birth: Mar 28, 1946  [facility]) CM/SW Contact  Ross Ludwig, [place] Phone Number: 02/18/2020, 10:25 AM  Clinical Narrative:     CSW contacted patient's daughter Janett Billow regarding the open claim on patient's insurance.  CSW gave her the phone number to contact Medicare 1-800- to see if they they can clear out the claim.  Patient's daughter called back CSW and per Medicare there is not any claim attached to patient's social security.  Daughter was referred to [facility]), [phone].  Patient's daughter will call CSW back once she talks to someone a the [facility].  CSW to continue to follow patient's progress throughout discharge planning.  11:30am  CSW spoke to patient's daughter and she said the [facility] would not share any information about patient's case, because neither she nor his wife were authorized to communicate with them.  CSW contacted [facility] and they said they need documentation that gives permission for them to speak to patient's wife or his daughter.  CSW informed agency that patient is only alert and oriented x1 and he can not consent to discussion of his information.  [facility] said they still need proof that patient's information can be shared with patient's wife.  CSW informed them that [place] law states that if patient is unable to make decisions on their own it defaults to patient's spouse or next of kin.  [facility] still stated that they can not share information without written consent from patient.  [facility] said it needs to be faxed to [phone] and put ATTN: Recovery Department.   1:00pm  CSW contacted patient's daughter to see if she has a copy of patient's advanced directive stating that patient's wife can speak and make decisions for him.  Patient's daughter said she can get a copy of the Advanced directive from patient's wife  and email it to [place].  1:15pm  CSW received copy of advanced directive from patient's daughter.  CSW contacted charge nurse to see if he could fax requested information to [facility], he said he could, CSW emailed Advanced directive, fax contact information, and requested identification information to charge nurse Jenny Reichmann to fax to  [facility].  2:55pm  CSW received confirmation from charge nurse that required documentation has been received.  3:30pm  CSW called [facility] back to confirm they have received the documentation, CSW spoke to [place], she said she can not confirm it has been received.  According to Wells Guiles, it can take up to 48 hours for faxed information to be uploaded.  Per Wells Guiles she said patient's wife would have to call tomorrow to see if they have received the information.  Once they have received the advanced directive, and it has been uploaded then they can speak to her.  Wells Guiles said patient's wife would have to tell them "per [place] law the statute of limitations is two years for a work comp claim and she would like to close the case."  Once she lets them know patient's case can then be closed and he can then try to use his rehab benefits in order to get SNF placement for short term rehab.  4:25pm  CSW called patient's daughter back and told her that once patient's case can be closed then CSW can arrange on trying to get patient placed in [facility] for short term rehab pending insurance authorization.  Patient's daughter stated she  will call patient's wife and have her follow up with [facility] tomorrow to see if it case can be closed.  4:35pm  CSW contacted Tracie at [facility] to update her on the situation.  Tracie at [facility] will speak to her administrator to see if they are willing to accept patient while the claim from April 06, 2010 is still open.  Leana Roe will follow up with CSW tomorrow.  Expected Discharge Plan: Skilled Nursing Facility Barriers to Discharge: Ship broker, Continued  Medical Work up  Expected Discharge Plan and Services Expected Discharge Plan: [place] In-house Referral: Clinical Social Work   Post Acute Care Choice: [place] Living arrangements for the past 2 months: Single Family Home                                       Social Determinants of Health (SDOH) Interventions    Readmission Risk Interventions Readmission Risk Prevention Plan 02/15/2020  Transportation Screening Complete  PCP or Specialist Appt within 3-5 Days Complete  HRI or [place] Complete  Social Work Consult for [place] Planning/Counseling Complete  Palliative Care Screening Not Complete  Palliative Care Screening Not Complete Comments Palliative consult not requested by physician  Medication Review (RN Care Manager) Referral to Pharmacy  Some recent data might be hidden

## 2020-02-19 LAB — COMPREHENSIVE METABOLIC PANEL
ALT: 70 U/L — ABNORMAL HIGH (ref 0–44)
AST: 55 U/L — ABNORMAL HIGH (ref 15–41)
Albumin: 3 g/dL — ABNORMAL LOW (ref 3.5–5.0)
Alkaline Phosphatase: 115 U/L (ref 38–126)
Anion gap: 10 (ref 5–15)
BUN: 73 mg/dL — ABNORMAL HIGH (ref 8–23)
CO2: 24 mmol/L (ref 22–32)
Calcium: 9.1 mg/dL (ref 8.9–10.3)
Chloride: 114 mmol/L — ABNORMAL HIGH (ref 98–111)
Creatinine, Ser: 1.65 mg/dL — ABNORMAL HIGH (ref 0.61–1.24)
GFR calc Af Amer: 47 mL/min — ABNORMAL LOW (ref >60)
GFR calc non Af Amer: 41 mL/min — ABNORMAL LOW (ref >60)
Glucose, Bld: 193 mg/dL — ABNORMAL HIGH (ref 70–99)
Potassium: 3.7 mmol/L (ref 3.5–5.1)
Sodium: 148 mmol/L — ABNORMAL HIGH (ref 135–145)
Total Bilirubin: 0.6 mg/dL (ref 0.3–1.2)
Total Protein: 6.6 g/dL (ref 6.5–8.1)

## 2020-02-19 LAB — CBC WITH DIFFERENTIAL/PLATELET
Abs Immature Granulocytes: 0.23 10*3/uL — ABNORMAL HIGH (ref 0.00–0.07)
Basophils Absolute: 0 10*3/uL (ref 0.0–0.1)
Basophils Relative: 0 %
Eosinophils Absolute: 0 10*3/uL (ref 0.0–0.5)
Eosinophils Relative: 0 %
HCT: 37.4 % — ABNORMAL LOW (ref 39.0–52.0)
Hemoglobin: 11.5 g/dL — ABNORMAL LOW (ref 13.0–17.0)
Immature Granulocytes: 3 %
Lymphocytes Relative: 8 %
Lymphs Abs: 0.7 10*3/uL (ref 0.7–4.0)
MCH: 28.1 pg (ref 26.0–34.0)
MCHC: 30.7 g/dL (ref 30.0–36.0)
MCV: 91.4 fL (ref 80.0–100.0)
Monocytes Absolute: 0.8 10*3/uL (ref 0.1–1.0)
Monocytes Relative: 10 %
Neutro Abs: 6.8 10*3/uL (ref 1.7–7.7)
Neutrophils Relative %: 79 %
Platelets: 157 10*3/uL (ref 150–400)
RBC: 4.09 MIL/uL — ABNORMAL LOW (ref 4.22–5.81)
RDW: 16.4 % — ABNORMAL HIGH (ref 11.5–15.5)
WBC: 8.6 10*3/uL (ref 4.0–10.5)
nRBC: 0.2 % (ref 0.0–0.2)

## 2020-02-19 LAB — PHOSPHORUS: Phosphorus: 3.3 mg/dL (ref 2.5–4.6)

## 2020-02-19 LAB — C-REACTIVE PROTEIN: CRP: 0.7 mg/dL (ref <1.0)

## 2020-02-19 LAB — D-DIMER, QUANTITATIVE: D-Dimer, Quant: 1.24 ug/mL-FEU — ABNORMAL HIGH (ref 0.00–0.50)

## 2020-02-19 LAB — FERRITIN: Ferritin: 40 ng/mL (ref 24–336)

## 2020-02-19 LAB — MAGNESIUM: Magnesium: 2.4 mg/dL (ref 1.7–2.4)

## 2020-02-19 MED ORDER — INSULIN ASPART 100 UNIT/ML ~~LOC~~ SOLN
0.0000 [IU] | SUBCUTANEOUS | Status: DC
  Administered 2020-02-19: 2 [IU] via SUBCUTANEOUS
  Administered 2020-02-20: 3 [IU] via SUBCUTANEOUS
  Administered 2020-02-20: 20:00:00 5 [IU] via SUBCUTANEOUS
  Administered 2020-02-20: 3 [IU] via SUBCUTANEOUS
  Administered 2020-02-21: 09:00:00 2 [IU] via SUBCUTANEOUS

## 2020-02-19 NOTE — Progress Notes (Addendum)
PROGRESS NOTE    Jimmy [name]  [id number] DOB: 04/03/1946 DOA: 02/08/2020 PCP: [name], MD   Brief Narrative:  74 year old WM PMHx Dyspnea.  CKD, diabetes type 2 uncontrolled with complication, OSA uses CPAP HTN, HLD, bladder cancer, nephrolithiasis, prostate cancer s/p seed implant + XRT CAD, obesity,  Patient reported not feeling well for several days at home, having dyspnea and cough. Home health nurse noticed him to be more confused having increased oxygen requirements. On his initial physical examination in the emergency department he was in respiratory distress and placed on nonrebreather mask,with oxygen saturation 90%, his blood pressure was 129/79, heart rate 102, respiratory rate28.His lungs were clear to auscultation, no wheezing or rhonchi, heart S1-S2, present with, abdomen was soft and nontender, no lower extremity edema.Urinalysis specific gravity 1.012, 0-5 red cells, 11-20 white cells, large leukocytes. Chest radiograph with left lower lobe interstitial infiltrate, increased bilateral markings. CT chest negative for pulmonary embolism, bilateral apical groundglass opacities bilateral subpleural groundglass infiltrates.  Patient has received supplemental oxygen per high flow nasal cannula, medical therapy with remdesivir and dexamethasone. For persistent hypoxic respiratory failure he received SARS COVID-19 convalescent plasma.  He continued to have high oxygen requirements. Ultrasonography lower extremities was positive for deep vein thrombosis on the left.  Patient responded well to diuresis for non cardiogenic pulmonary edema with improvement of oxygenation.  Urothelial cancer: 06/22/2019. SP TURB with bilateralureteral stentsplacement/final pathology showed high-grade infiltrating papillary adenocarcinoma, invading into the detrusor muscle with lymphovascular invasion. .  08/07/2019 patient underwent neoadjuvant chemotherapy,4 cycles completed  October 16, 2019. 12/2019 underwent radical cystectomy and lymph node dissection.   Final pathology showed 1.5 cm residual tumor with invasion into the perivascular soft tissue. Final pathology stage T38 and 0 with 0 out of 10 lymph nodes involved.Currently under active surveillance.  Patient is very weak and deconditioned, poor oral intake. Poorly responsive. Had bloody stools and anticoagulation was heldfor 24 H.   Enoxaparin have been resumed, but if recurrent bleeding patient may need IVC filter.   Subjective: 2/19 afebrile last 24 hours patient follows commands, more confused today.  States he is not hungry and does not want to eat although RN states he ate~50% of his breakfast   Assessment & Plan:   Principal Problem:   Pneumonia due to COVID-19 virus Active Problems:   Hypertension associated with type 2 diabetes mellitus ([place])   Malignant neoplasm of urinary bladder (HCC)   Acute respiratory failure due to COVID-19 [facility])  Covid pneumonia/acute respiratory failure with hypoxia COVID-19 Labs  Recent Labs    02/17/20 0358 02/18/20 0239 02/19/20 0238  DDIMER 1.48* 1.33* 1.24*  FERRITIN 37 34 40  CRP 1.4* 1.4* 0.7    Lab Results  Component Value Date   SARSCOV2NAA POSITIVE (A) 02/08/2020   SARSCOV2NAA NEGATIVE 12/23/2019   SARSCOV2NAA NOT DETECTED 12/07/2019   SARSCOV2NAA NEGATIVE 06/18/2019   -Decadron 6 mg daily -Remdesivir completed course -Plan for SNF at discharge.   Awaiting insurance clearance.  Acute LEFT lower extremity DVT/complicated by NEW bloody stools. -Continue full dose anticoagulation.  Continue to monitor patient's hemoglobin. -If signs of rebleed will require IVC filter  Lower GI bleed/suspected ischemic colitis secondary to Covid Recent Labs  Lab 02/15/20 0304 02/16/20 0431 02/17/20 0358 02/18/20 0239 02/19/20 0238  HGB 11.5* 12.2* 12.4* 12.1* 11.5*  -H/H stable -Continue Protonix 40 mg BID  Essential  HTN/tachycardia -2/18 Metoprolol [id number] mg BID  Metabolic encephalopathy -[id number] patient appears to wax and wane yesterday was able  to feed himself and answer questions appropriately.  Today confused, needed help with feeding.    High-grade urothelial carcinoma of the bladder -Positive pyuria, negative urinary symptoms. -Culture positive coag negative staph most likely contaminant  UTI? -urine positive Aerococcus Urinae/staph Epidermidis, most likely contaminant, with negative fever, negative leukocytosis, most likely contaminant.  Therefore UTI ruled out  -Monitor closely for signs and symptoms  Acute on CKD stage IIIa (baseline Cr 1.39)  Recent Labs  Lab 02/15/20 0304 02/16/20 0431 02/17/20 0358 02/18/20 0239 02/19/20 0238  CREATININE 1.38* 1.53* 1.39* 1.54* 1.65*  -Strict in and out -8.6 L -Daily weight -2/19 increase D5W 145ml/hr  Diabetes type 2controlled with complication  -2/9 Hemoglobin A1c = 6.0  -2/18 increase Levemir 16 units BID -2/18 increase NovoLog 10 units qac -Moderate SSI -DC Ensure  Unspecified protein calorie malnutrition -Encourage patient to eat   Hypernatremia -See CKD  Extreme weakness -Per RN yesterday patient able to ambulate from bed to chair, today barely able to raise arms and legs.   -2/17 CT head negative for acute hemorrhage or infarct see results below   Goals of care -2/17 PT/OT recommend SNF.  Awaiting insurance authorization..    DVT prophylaxis: Lovenox therapeutic dose Code Status: Full Family Communication: 2/19 Spoke [name] (wife) counseled on plan of care answered all questions Disposition Plan: Discharge most likely on Saturday 2/20   Consultants:    Procedures/Significant Events:  2/17 CT head Wo contrast; negative acute intracranial hemorrhage.  Negative acute infarction      I have personally reviewed and interpreted all radiology studies and my findings are as above.  VENTILATOR SETTINGS: Nasal cannula  2/19 Flow; 3 L/min  SPO2 91%   Cultures 2/8 urine positive Aerococcus Urinae/staph Epidermidis 2/8 SARS coronavirus 2 positive     Antimicrobials: Anti-infectives (From admission, onward)   Start     Dose/Rate Stop   02/09/20 1000  remdesivir 100 mg in sodium chloride 0.9 % 100 mL IVPB     100 mg 200 mL/hr over 30 Minutes 02/12/20 1829   02/09/20 0800  cefTRIAXone (ROCEPHIN) 1 g in sodium chloride 0.9 % 100 mL IVPB  Status:  Discontinued     1 g 200 mL/hr over 30 Minutes 02/10/20 1044   02/08/20 2200  remdesivir 200 mg in sodium chloride 0.9% 250 mL IVPB     200 mg 580 mL/hr over 30 Minutes 02/08/20 2246       Devices    LINES / TUBES:      Continuous Infusions: . sodium chloride    . dextrose 125 mL/hr at 02/19/20 0521     Objective: Vitals:   02/19/20 0344 02/19/20 0350 02/19/20 0635 02/19/20 0742  BP: 116/70   122/68  Pulse: 85 84 88 94  Resp: 18 18 17 19   Temp: 97.9 F (36.6 C)   97.9 F (36.6 C)  TempSrc: Axillary   Axillary  SpO2: (!) 89% 92% 91% 90%  Weight:      Height:        Intake/Output Summary (Last 24 hours) at 02/19/2020 0905 Last data filed at 02/19/2020 0800 Gross per 24 hour  Intake 2361.28 ml  Output 2151 ml  Net 210.28 ml   Filed Weights   02/08/20 2230 02/15/20 1032 02/17/20 0536  Weight: (!) 185.7 kg 85.6 kg 83.7 kg   Physical Exam:  General: Alert, follows some commands, positive acute respiratory distress Eyes: negative scleral hemorrhage, negative anisocoria, negative icterus ENT: Negative Runny nose, negative gingival bleeding,  Neck:  Negative scars, masses, torticollis, lymphadenopathy, JVD Lungs: Clear to auscultation bilaterally without wheezes or crackles Cardiovascular: Regular rate and rhythm without murmur gallop or rub normal S1 and S2 Abdomen: negative abdominal pain, nondistended, positive soft, bowel sounds, no rebound, no ascites, no appreciable mass Extremities: No significant cyanosis, clubbing, or edema  bilateral lower extremities Skin: Negative rashes, lesions, ulcers Psychiatric:  Negative depression, negative anxiety, negative fatigue, negative mania  Central nervous system:  Cranial nerves II through XII intact, tongue/uvula midline, all extremities muscle strength 5/5, sensation intact throughout, negative dysarthria, negative expressive aphasia, negative receptive aphasia.      Data Reviewed: Care during the described time interval was provided by me .  I have reviewed this patient's available data, including medical history, events of note, physical examination, and all test results as part of my evaluation.   CBC: Recent Labs  Lab 02/15/20 0304 02/16/20 0431 02/17/20 0358 02/18/20 0239 02/19/20 0238  WBC 6.9 9.0 7.9 5.4 8.6  NEUTROABS 6.2 8.2* 7.0 4.7 6.8  HGB 11.5* 12.2* 12.4* 12.1* 11.5*  HCT 36.9* 39.1 39.9 39.8 37.4*  MCV 90.9 90.9 91.7 92.3 91.4  PLT 217 230 177 164 [id number]   Basic Metabolic Panel: Recent Labs  Lab 02/15/20 0304 02/16/20 0431 02/17/20 0358 02/18/20 0239 02/19/20 0238  NA 145 145 151* 150* 148*  K 5.0 5.1 4.5 4.7 3.7  CL 114* 113* 117* 116* 114*  CO2 21* 21* 23 23 24   GLUCOSE 241* 356* 267* 343* 193*  BUN 63* 66* 68* 74* 73*  CREATININE 1.38* 1.53* 1.39* 1.54* 1.65*  CALCIUM 9.4 9.5 9.7 9.5 9.1  MG  --  2.4 2.3 2.5* 2.4  PHOS  --   --  3.8 3.6 3.3   GFR: Estimated Creatinine Clearance: 42 mL/min (A) (by C-G formula based on SCr of 1.65 mg/dL (H)). Liver Function Tests: Recent Labs  Lab 02/13/20 0040 02/14/20 0050 02/18/20 0239 02/19/20 0238  AST 98* 73* 37 55*  ALT 76* 72* 62* 70*  ALKPHOS 150* 139* 118 115  BILITOT 0.5 0.8 0.5 0.6  PROT 7.0 7.2 6.9 6.6  ALBUMIN 3.1* 3.0* 3.0* 3.0*   No results for input(s): LIPASE, AMYLASE in the last 168 hours. No results for input(s): AMMONIA in the last 168 hours. Coagulation Profile: No results for input(s): INR, PROTIME in the last 168 hours. Cardiac Enzymes: No results for input(s):  CKTOTAL, CKMB, CKMBINDEX, TROPONINI in the last 168 hours. BNP (last 3 results) No results for input(s): PROBNP in the last 8760 hours. HbA1C: No results for input(s): HGBA1C in the last 72 hours. CBG: Recent Labs  Lab 02/16/20 0706 02/16/20 0724 02/16/20 1203 02/16/20 1520 02/16/20 2106  GLUCAP 267* 281* 367* 200* 323*   Lipid Profile: No results for input(s): CHOL, HDL, LDLCALC, TRIG, CHOLHDL, LDLDIRECT in the last 72 hours. Thyroid Function Tests: No results for input(s): TSH, T4TOTAL, FREET4, T3FREE, THYROIDAB in the last 72 hours. Anemia Panel: Recent Labs    02/18/20 0239 02/19/20 0238  FERRITIN 34 40   Urine analysis:    Component Value Date/Time   COLORURINE YELLOW 02/08/2020 1545   APPEARANCEUR CLOUDY (A) 02/08/2020 1545   LABSPEC 1.012 02/08/2020 1545   PHURINE 6.0 02/08/2020 1545   GLUCOSEU NEGATIVE 02/08/2020 1545   HGBUR NEGATIVE 02/08/2020 [place] 02/08/2020 1545   BILIRUBINUR n 06/08/2016 1109   KETONESUR NEGATIVE 02/08/2020 1545   PROTEINUR NEGATIVE 02/08/2020 1545   UROBILINOGEN negative 06/08/2016 1109   NITRITE NEGATIVE  02/08/2020 1545   LEUKOCYTESUR LARGE (A) 02/08/2020 1545   Sepsis Labs: @LABRCNTIP (procalcitonin:4,lacticidven:4)  ) No results found for this or any previous visit (from the past 240 hour(s)).       Radiology Studies: CT HEAD WO CONTRAST  Result Date: 02/17/2020 CLINICAL DATA:  Ataxia EXAM: CT HEAD WITHOUT CONTRAST TECHNIQUE: Contiguous axial images were obtained from the base of the skull through the vertex without intravenous contrast. COMPARISON:  02/08/2020 FINDINGS: Brain: There is no acute intracranial hemorrhage, mass-effect, or edema. There is no new loss of gray-white differentiation. There is no extra-axial fluid collection. Ventricles and sulci are within normal limits in size and configuration. Patchy hypoattenuation the supratentorial white matter is nonspecific but may reflect mild chronic  microvascular ischemic changes. Vascular: No hyperdense vessel or unexpected calcification. Skull: Calvarium is unremarkable. Sinuses/Orbits: No acute finding. Other: None. IMPRESSION: No acute intracranial hemorrhage, mass effect, or evidence of acute infarction. Electronically Signed   By: Macy Mis M.D.   On: 02/17/2020 14:29        Scheduled Meds: . vitamin C  500 mg Oral Daily  . chlorpheniramine-HYDROcodone  5 mL Oral Q12H  . enoxaparin (LOVENOX) injection  85 mg Subcutaneous Q12H  . insulin aspart  0-5 Units Subcutaneous QHS  . insulin aspart  0-9 Units Subcutaneous TID WC  . insulin aspart  10 Units Subcutaneous TID WC  . insulin detemir  16 Units Subcutaneous BID  . Ipratropium-Albuterol  1 puff Inhalation Q6H  . metoprolol tartrate  12.5 mg Oral BID  . multivitamin with minerals  1 tablet Oral Daily  . nystatin  5 mL Oral QID  . pantoprazole (PROTONIX) IV  40 mg Intravenous Q12H  . sodium chloride flush  3 mL Intravenous Once  . sodium chloride flush  3 mL Intravenous Q12H  . sodium zirconium cyclosilicate  10 g Oral TID  . thiamine  100 mg Oral Daily  . zinc sulfate  220 mg Oral Daily   Continuous Infusions: . sodium chloride    . dextrose 125 mL/hr at 02/19/20 0521     LOS: 11 days   The patient is critically ill with multiple organ systems failure and requires high complexity decision making for assessment and support, frequent evaluation and titration of therapies, application of advanced monitoring technologies and extensive interpretation of multiple databases. Critical Care Time devoted to patient care services described in this note  Time spent: 40 minutes     Bryker Fletchall, Geraldo Docker, MD [facility] Pager [phone]  If 7PM-7AM, please contact night-coverage [email or web address] Password [facility] 02/19/2020, 9:05 AM

## 2020-02-19 NOTE — Progress Notes (Signed)
  Speech Language Pathology Treatment: Dysphagia  Patient Details Name: Jimmy [name] MRN: [id number] DOB: 02-Nov-1946 Today's Date: 02/19/2020 Time: 1000-1015 SLP Time Calculation (min) (ACUTE ONLY): 15 min  Assessment / Plan / Recommendation Clinical Impression  Pt demonstrates improved endurance to participate and self feed in comparison with last visit. Pt eager to accept nectar thick water and OJ. Needed a verbal or tactile cue to safely limit sip size in 100% of trials over 16 oz of liquid intake. If not constantly reminded pt took an overlarge sip, though a cough was elicited in only one instance. Reinforced precautions with pt who will continue to need supervision. Continue current diet.   HPI HPI: 74 year old male admitted with COVID/PNA; PMH bladder cancer, OSA, DM, bronchitis, CAD. OT  witnessed pt coughing with 3/4 sips water.      SLP Plan  Continue with current plan of care       Recommendations  Diet recommendations: Dysphagia 2 (fine chop);Nectar-thick liquid Medication Administration: Whole meds with puree Supervision: Staff to assist with self feeding;Full supervision/cueing for compensatory strategies Compensations: Slow rate;Small sips/bites;Clear throat intermittently                Oral Care Recommendations: Oral care BID Follow up Recommendations: Skilled Nursing facility SLP Visit Diagnosis: Dysphagia, oropharyngeal phase (R13.12) Plan: Continue with current plan of care       GO               Jimmy [place], MA [place] Pager [phone] Office [phone]   Jimmy [name] 02/19/2020, 11:10 AM

## 2020-02-19 NOTE — Progress Notes (Signed)
Wife updated on phone and phone taken to patients room so she could speak with him. He was responsive though vocalizations weak and hoarse.

## 2020-02-19 NOTE — TOC Progression Note (Addendum)
Transition of Care [facility]) - Progression Note    Patient Details  Name: Jimmy [name] MRN: [id number] Date of Birth: 14-Oct-1946  Transition of [facility]) CM/SW Contact  Jimmy [name], [name] Phone Number: 02/19/2020, 4:49 PM  Clinical Narrative:     CSW received a phone call from [place] at [facility], they have agreed to accept patient over the weekend if he is medically ready for discharge.  CSW started insurance authorization through [facility], reference number is [id number].  CSW provided [facility] Medicare the weekend social worker phone numbers in case insurance Josem Kaufmann is received over the weekend.  Patient does not have to wait for auth to discharge.  Patient's daughter Jimmy [name] was made aware, and she will update patient's wife.  CSW to continue to follow patient's progress throughout discharge planning.  Expected Discharge Plan: Skilled Nursing Facility Barriers to Discharge: Ship broker, Continued Medical Work up  Expected Discharge Plan and Services Expected Discharge Plan: [place] In-house Referral: Clinical Social Work   Post Acute Care Choice: [place] Living arrangements for the past 2 months: Single Family Home                                       Social Determinants of Health (SDOH) Interventions    Readmission Risk Interventions Readmission Risk Prevention Plan 02/15/2020  Transportation Screening Complete  PCP or Specialist Appt within 3-5 Days Complete  HRI or [place] Complete  Social Work Consult for [place] Planning/Counseling Complete  Palliative Care Screening Not Complete  Palliative Care Screening Not Complete Comments Palliative consult not requested by physician  Medication Review (RN Care Manager) Referral to Pharmacy  Some recent data might be hidden

## 2020-02-19 NOTE — Progress Notes (Signed)
Nutrition Follow-up RD working remotely.  DOCUMENTATION CODES:   Not applicable  INTERVENTION:    Recommend EndoTool (IV insulin) to help manage blood sugars. See Inpatient Diabetes Program note for recommendations.   Magic cup TID with meals, each supplement provides 290 kcal and 9 grams of protein.   Vital Cuisine Shake BID, each supplement provides 520 kcal and 22 grams of protein.   NUTRITION DIAGNOSIS:   Increased nutrient needs related to acute illness, catabolic illness(COVID) as evidenced by estimated needs.  GOAL:   Patient will meet greater than or equal to 90% of their needs  MONITOR:   PO intake, Supplement acceptance, Labs, Weight trends, Skin  REASON FOR ASSESSMENT:   Consult Assessment of nutrition requirement/status  ASSESSMENT:   74 yo male admitted with respiratory distress r/t COVID-19 PNA. Tested positive for COVID in the ED. PMH includes CKD, HTN, bladder cancer, CAD.   Patient was re-weighed 2/15 and 2/17. Both readings are more consistent with recent weights. However, patient has lost a lot of weight since December 2020 when he weighed 110.7 kg, down to 83.7 kg 2/17. 24% weight loss within 3 months is significant for the time frame.   SLP following. Currently on a dysphagia 2 diet with nectar thick liquids. He is consuming 0-90% of meals. He ate 50% of breakfast today. Had been drinking Ensure Enlive 2-3 times per day, but MD d/c'ed it 2/18 due to elevated blood sugars. No CBG recorded since 2/16. Patient is now on nectar thick liquids, will add appropriate supplements to meal trays.   Patient is currently requiring 3 L oxygen via nasal cannula.  Labs reviewed.  CBG's: no documentation in labs since 2/16  Medications reviewed.  NUTRITION - FOCUSED PHYSICAL EXAM:  unable to complete  Diet Order:   Diet Order            DIET DYS 2 Room service appropriate? No; Fluid consistency: Nectar Thick  Diet effective now               EDUCATION NEEDS:   Not appropriate for education at this time  Skin:  Skin Assessment: Reviewed RN Assessment  Last BM:  2/18  Height:   Ht Readings from Last 1 Encounters:  02/15/20 5\' 8"  (1.727 m)    Weight:   Wt Readings from Last 1 Encounters:  02/17/20 83.7 kg   Ideal Body Weight:  70 kg  Estimated Nutritional Needs:   Kcal:  2000-2200  Protein:  115-130 gm  Fluid:  >/= 2 L    Molli Barrows, RD, LDN, CNSC Please refer to Amion for contact information.

## 2020-02-20 LAB — COMPREHENSIVE METABOLIC PANEL
ALT: 67 U/L — ABNORMAL HIGH (ref 0–44)
AST: 63 U/L — ABNORMAL HIGH (ref 15–41)
Albumin: 2.8 g/dL — ABNORMAL LOW (ref 3.5–5.0)
Alkaline Phosphatase: 100 U/L (ref 38–126)
Anion gap: 14 (ref 5–15)
BUN: 60 mg/dL — ABNORMAL HIGH (ref 8–23)
CO2: 22 mmol/L (ref 22–32)
Calcium: 8.8 mg/dL — ABNORMAL LOW (ref 8.9–10.3)
Chloride: 105 mmol/L (ref 98–111)
Creatinine, Ser: 1.29 mg/dL — ABNORMAL HIGH (ref 0.61–1.24)
GFR calc Af Amer: >60 mL/min (ref >60)
GFR calc non Af Amer: 55 mL/min — ABNORMAL LOW (ref >60)
Glucose, Bld: 170 mg/dL — ABNORMAL HIGH (ref 70–99)
Potassium: 3.1 mmol/L — ABNORMAL LOW (ref 3.5–5.1)
Sodium: 141 mmol/L (ref 135–145)
Total Bilirubin: 0.5 mg/dL (ref 0.3–1.2)
Total Protein: 6.2 g/dL — ABNORMAL LOW (ref 6.5–8.1)

## 2020-02-20 LAB — CBC WITH DIFFERENTIAL/PLATELET
Abs Immature Granulocytes: 0.19 10*3/uL — ABNORMAL HIGH (ref 0.00–0.07)
Basophils Absolute: 0 10*3/uL (ref 0.0–0.1)
Basophils Relative: 0 %
Eosinophils Absolute: 0.2 10*3/uL (ref 0.0–0.5)
Eosinophils Relative: 2 %
HCT: 34.8 % — ABNORMAL LOW (ref 39.0–52.0)
Hemoglobin: 10.7 g/dL — ABNORMAL LOW (ref 13.0–17.0)
Immature Granulocytes: 2 %
Lymphocytes Relative: 10 %
Lymphs Abs: 0.9 10*3/uL (ref 0.7–4.0)
MCH: 28.1 pg (ref 26.0–34.0)
MCHC: 30.7 g/dL (ref 30.0–36.0)
MCV: 91.3 fL (ref 80.0–100.0)
Monocytes Absolute: 0.6 10*3/uL (ref 0.1–1.0)
Monocytes Relative: 7 %
Neutro Abs: 6.8 10*3/uL (ref 1.7–7.7)
Neutrophils Relative %: 79 %
Platelets: 135 10*3/uL — ABNORMAL LOW (ref 150–400)
RBC: 3.81 MIL/uL — ABNORMAL LOW (ref 4.22–5.81)
RDW: 16.5 % — ABNORMAL HIGH (ref 11.5–15.5)
WBC: 8.7 10*3/uL (ref 4.0–10.5)
nRBC: 0.2 % (ref 0.0–0.2)

## 2020-02-20 LAB — C-REACTIVE PROTEIN: CRP: 1.1 mg/dL — ABNORMAL HIGH (ref <1.0)

## 2020-02-20 LAB — PHOSPHORUS: Phosphorus: 3.7 mg/dL (ref 2.5–4.6)

## 2020-02-20 LAB — FERRITIN: Ferritin: 38 ng/mL (ref 24–336)

## 2020-02-20 LAB — D-DIMER, QUANTITATIVE: D-Dimer, Quant: 1.25 ug/mL-FEU — ABNORMAL HIGH (ref 0.00–0.50)

## 2020-02-20 LAB — MAGNESIUM: Magnesium: 2.1 mg/dL (ref 1.7–2.4)

## 2020-02-20 MED ORDER — APIXABAN 5 MG PO TABS
5.0000 mg | ORAL_TABLET | Freq: Two times a day (BID) | ORAL | Status: DC
  Administered 2020-02-20 – 2020-02-21 (×2): 5 mg via ORAL
  Filled 2020-02-20 (×2): qty 1

## 2020-02-20 MED ORDER — PANTOPRAZOLE SODIUM 40 MG PO TBEC
40.0000 mg | DELAYED_RELEASE_TABLET | Freq: Two times a day (BID) | ORAL | Status: DC
  Administered 2020-02-20 – 2020-02-21 (×2): 40 mg via ORAL
  Filled 2020-02-20 (×2): qty 1

## 2020-02-20 MED ORDER — POTASSIUM CHLORIDE CRYS ER 20 MEQ PO TBCR
50.0000 meq | EXTENDED_RELEASE_TABLET | Freq: Once | ORAL | Status: AC
  Administered 2020-02-20: 50 meq via ORAL
  Filled 2020-02-20: qty 3

## 2020-02-20 NOTE — Progress Notes (Signed)
Occupational Therapy Treatment Patient Details Name: Jimmy [name] MRN: [id number] DOB: 12/10/1946 Today's Date: 02/20/2020    History of present illness 74 year old male admitted with COVID/PNA, Other dx include L LE DVT, lower GIB with suspected ischemic colitis, metabolic encepholopathy, hyperkalemia, metabolic acidosis, hypernatremia.  CT head negative for stroke.PMH includes bladder cancer- ileus conduit with urinary diversion. Has OSA but generally does not use his CPAP. Has had multiple surgeries- including for bladder cancer- most recent Dec 2020   OT comments  Patient supine in bed on arrival.  He is still speaking in a small whisper and often does not respond, but patient expressed he is willing to attempt with therapy.  Patient gave good effort to sit at EOB.  Required max assist and only able to sit up briefly as patient expressed he did not feel well and needed to lie back down.  He required max assist with grooming at bed level as UE very weak (2+/5).  Completed AAROM with UE to help strengthen for ADLs and increase ROM/prevent decline from lack of mobility.   Patient remained on room air throughout session and SpO2>  94.  Will continue to follow with OT acutely to address the deficits listed below.    Follow Up Recommendations  Supervision/Assistance - 24 hour;SNF    Equipment Recommendations  3 in 1 bedside commode    Recommendations for Other Services      Precautions / Restrictions Precautions Precautions: Fall Restrictions Weight Bearing Restrictions: No       Mobility Bed Mobility Overal bed mobility: Needs Assistance Bed Mobility: Rolling;Supine to Sit;Sit to Supine Rolling: Max assist   Supine to sit: HOB elevated;Max assist        Transfers Overall transfer level: Needs assistance                    Balance Overall balance assessment: Needs assistance                                         ADL either performed or  assessed with clinical judgement   ADL Overall ADL's : Needs assistance/impaired     Grooming: Maximal assistance;Bed level;Wash/dry hands;Wash/dry face   Upper Body Bathing: Maximal assistance;Bed level                             General ADL Comments: Attempted getting to EOB, not able to fully complete as patient feeling too weak     Vision       Perception     Praxis      Cognition Arousal/Alertness: Awake/alert Behavior During Therapy: Flat affect;Anxious Overall Cognitive Status: Impaired/Different from baseline Area of Impairment: Orientation;Attention;Memory;Following commands;Safety/judgement;Awareness;Problem solving                 Orientation Level: Disoriented to;Place;Time;Situation Current Attention Level: Sustained Memory: Decreased short-term memory Following Commands: Follows one step commands with increased time Safety/Judgement: Decreased awareness of safety;Decreased awareness of deficits Awareness: Intellectual Problem Solving: Slow processing;Decreased initiation;Difficulty sequencing;Requires verbal cues;Requires tactile cues          Exercises Exercises: General Upper Extremity General Exercises - Upper Extremity Shoulder Flexion: AAROM;5 reps Shoulder ABduction: AAROM;5 reps Elbow Flexion: AAROM;5 reps   Shoulder Instructions       General Comments      Pertinent Vitals/ Pain  Pain Assessment: Faces Faces Pain Scale: Hurts little more Pain Location: general discomfort Pain Descriptors / Indicators: Grimacing;Discomfort Pain Intervention(s): Limited activity within patient's tolerance;Monitored during session  Home Living                                          Prior Functioning/Environment              Frequency  Min 3X/week        Progress Toward Goals  OT Goals(current goals can now be found in the care plan section)  Progress towards OT goals: Not progressing toward  goals - comment(Increased weakness and fatigue)  Acute Rehab OT Goals Patient Stated Goal: none stated OT Goal Formulation: Patient unable to participate in goal setting Time For Goal Achievement: 02/25/20 Potential to Achieve Goals: [place] Discharge plan remains appropriate    Co-evaluation                 AM-PAC OT "6 Clicks" Daily Activity     Outcome Measure   Help from another person eating meals?: A Little Help from another person taking care of personal grooming?: A Lot Help from another person toileting, which includes using toliet, bedpan, or urinal?: Total Help from another person bathing (including washing, rinsing, drying)?: A Lot Help from another person to put on and taking off regular upper body clothing?: A Lot Help from another person to put on and taking off regular lower body clothing?: A Lot 6 Click Score: 12    End of Session    OT Visit Diagnosis: Unsteadiness on feet (R26.81);Other abnormalities of gait and mobility (R26.89);Muscle weakness (generalized) (M62.81);Other symptoms and signs involving cognitive function;Pain   Activity Tolerance Patient limited by lethargy;Patient limited by fatigue   Patient Left in bed;with call bell/phone within reach;with nursing/sitter in room;with bed alarm set   Nurse Communication Mobility status        Time: [id number] OT Time Calculation (min): 27 min  Charges: OT General Charges $OT Visit: 1 Visit OT Treatments $Self Care/Home Management : 8-22 mins $Therapeutic Activity: 8-22 mins  August Luz, OTR/L    Phylliss Bob 02/20/2020, 2:48 PM

## 2020-02-20 NOTE — Progress Notes (Signed)
[place] for Lovenox Indication: DVT  Allergies  Allergen Reactions  . Ace Inhibitors Cough  . Cephalexin Hives, Diarrhea and Nausea Only    Tolerates Ancef, Rocephin, Cefoxitin  . Crestor [Rosuvastatin Calcium]   . Lipitor [Atorvastatin Calcium] Other (See Comments)    MUSCLE PAINS   . Plavix [Clopidogrel Bisulfate] Hives  . Vancomycin Nausea And Vomiting    Po route caused n/v.     Patient Measurements: Height: 5\' 8"  (172.7 cm) Weight: 193 lb 12.6 oz (87.9 kg) IBW/kg (Calculated) : 68.4 Heparin Dosing Weight: 85.6 kg  Vital Signs: Temp: 97.8 F (36.6 C) (02/20 1215) Temp Source: Oral (02/20 0828) BP: 103/64 (02/20 1215) Pulse Rate: 85 (02/20 1215)  Labs: Recent Labs    02/18/20 0239 02/18/20 0239 02/19/20 0238 02/20/20 0035  HGB 12.1*   < > 11.5* 10.7*  HCT 39.8  --  37.4* 34.8*  PLT 164  --  157 135*  CREATININE 1.54*  --  1.65* 1.29*   < > = values in this interval not displayed.    Estimated Creatinine Clearance: 55 mL/min (A) (by C-G formula based on SCr of 1.29 mg/dL (H)).   Medical History: Past Medical History:  Diagnosis Date  . Astigmatism of both eyes 11/01/2016  . Bronchitis   . Coronary artery disease   . Cortical age-related cataract of both eyes 11/01/2016  . Diabetes mellitus without complication ([place])    type 2  . Drug-induced osteoporosis   . Dyslipidemia   . Fatigue   . Fatty liver   . History of kidney stones   . Hypercholesteremia   . Hypogonadism male   . 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 Normal nuclear stress test March 2011  . Nuclear sclerotic cataract of both eyes 11/01/2016  . Obesity   . Prostate cancer [facility])    s/p seed implant and radiation  . Sleep apnea    90% of time dosent wear his cpap per pt  . Vitamin D insufficiency     Medications:  Scheduled:  . vitamin C  500 mg Oral  Daily  . chlorpheniramine-HYDROcodone  5 mL Oral Q12H  . enoxaparin (LOVENOX) injection  85 mg Subcutaneous Q12H  . insulin aspart  0-15 Units Subcutaneous Q4H  . insulin aspart  10 Units Subcutaneous TID WC  . insulin detemir  16 Units Subcutaneous BID  . Ipratropium-Albuterol  1 puff Inhalation Q6H  . metoprolol tartrate  12.5 mg Oral BID  . multivitamin with minerals  1 tablet Oral Daily  . nystatin  5 mL Oral QID  . pantoprazole  40 mg Oral BID  . sodium chloride flush  3 mL Intravenous Once  . sodium chloride flush  3 mL Intravenous Q12H  . thiamine  100 mg Oral Daily  . zinc sulfate  220 mg Oral Daily    Assessment: 74 y/o M admitted with COVID-19 PNA developed LLE DVT complicated by bloody stools. Eliquis stopped 2/11, DVT prophylaxis Lovenox given 2/13.   CBC stable, PLT down some Bloody stools improving on PPI SCr improved, stable; UOP OK  Hgb is down slightly to 10.7. The plan is to resume his apixaban today and stop lovenox after no signs of bleeds over several days. We will avoid any load since the pt has been on apixaban load>lovenox.   Goal of Therapy:  Anti-Xa level 0.6-1  units/ml 4hrs after LMWH dose given Monitor platelets by anticoagulation protocol: Yes   Plan:  Dc lovenox Apixaban 5mg  PO BID Monitor renal function, CBC, s/s of bleeding  Onnie Boer, PharmD, BCIDP, AAHIVP, CPP Infectious Disease Pharmacist 02/20/2020 4:01 PM

## 2020-02-20 NOTE — TOC Progression Note (Signed)
Transition of Care [facility]) - Progression Note    Patient Details  Name: ABOUBACAR REICHNER MRN: [id number] Date of Birth: 07-26-1946  Transition of Care [facility]) CM/SW Contact  Bartholomew Crews, RN Phone Number: [phone] 02/20/2020, 2:21 PM  Clinical Narrative:    Notified by MD of patient's readiness to discharge to [facility] tomorrow. Contacted Tracy in Admissions at [facility] - they can accept him tomorrow. Notified patient's daughter, Janett Billow, of plan. Janett Billow to reach out to [employer] for admission paperwork. TOC team following for transition needs.    Expected Discharge Plan: Skilled Nursing Facility Barriers to Discharge: Ship broker, Continued Medical Work up  Expected Discharge Plan and Services Expected Discharge Plan: [place] In-house Referral: Clinical Social Work   Post Acute Care Choice: [place] Living arrangements for the past 2 months: Single Family Home                                       Social Determinants of Health (SDOH) Interventions    Readmission Risk Interventions Readmission Risk Prevention Plan 02/15/2020  Transportation Screening Complete  PCP or Specialist Appt within 3-5 Days Complete  HRI or [place] Complete  Social Work Consult for [place] Planning/Counseling Complete  Palliative Care Screening Not Complete  Palliative Care Screening Not Complete Comments Palliative consult not requested by physician  Medication Review (RN Care Manager) Referral to Pharmacy  Some recent data might be hidden

## 2020-02-20 NOTE — Progress Notes (Signed)
PROGRESS NOTE    Jimmy [name]  [id number] DOB: 04-25-1946 DOA: 02/08/2020 PCP: [name], MD   Brief Narrative:  74 year old WM PMHx Dyspnea.  CKD, diabetes type 2 uncontrolled with complication, OSA uses CPAP HTN, HLD, bladder cancer, nephrolithiasis, prostate cancer s/p seed implant + XRT CAD, obesity,  Patient reported not feeling well for several days at home, having dyspnea and cough. Home health nurse noticed him to be more confused having increased oxygen requirements. On his initial physical examination in the emergency department he was in respiratory distress and placed on nonrebreather mask,with oxygen saturation 90%, his blood pressure was 129/79, heart rate 102, respiratory rate28.His lungs were clear to auscultation, no wheezing or rhonchi, heart S1-S2, present with, abdomen was soft and nontender, no lower extremity edema.Urinalysis specific gravity 1.012, 0-5 red cells, 11-20 white cells, large leukocytes. Chest radiograph with left lower lobe interstitial infiltrate, increased bilateral markings. CT chest negative for pulmonary embolism, bilateral apical groundglass opacities bilateral subpleural groundglass infiltrates.  Patient has received supplemental oxygen per high flow nasal cannula, medical therapy with remdesivir and dexamethasone. For persistent hypoxic respiratory failure he received SARS COVID-19 convalescent plasma.  He continued to have high oxygen requirements. Ultrasonography lower extremities was positive for deep vein thrombosis on the left.  Patient responded well to diuresis for non cardiogenic pulmonary edema with improvement of oxygenation.  Urothelial cancer: 06/22/2019. SP TURB with bilateralureteral stentsplacement/final pathology showed high-grade infiltrating papillary adenocarcinoma, invading into the detrusor muscle with lymphovascular invasion. .  08/07/2019 patient underwent neoadjuvant chemotherapy,4 cycles completed  October 16, 2019. 12/2019 underwent radical cystectomy and lymph node dissection.   Final pathology showed 1.5 cm residual tumor with invasion into the perivascular soft tissue. Final pathology stage T38 and 0 with 0 out of 10 lymph nodes involved.Currently under active surveillance.  Patient is very weak and deconditioned, poor oral intake. Poorly responsive. Had bloody stools and anticoagulation was heldfor 24 H.   Enoxaparin have been resumed, but if recurrent bleeding patient may need IVC filter.   Subjective: 2/20 afebrile last 24 hours patient eating lunch.  Per RN Saralyn Pilar patient unable to feed himself today, except for drinking his juices.  Patient appears to nod yes and no appropriately to questions.  Nonverbal     Assessment & Plan:   Principal Problem:   Pneumonia due to COVID-19 virus Active Problems:   Hypertension associated with type 2 diabetes mellitus ([place])   Malignant neoplasm of urinary bladder (HCC)   Acute respiratory failure due to COVID-19 [facility])  Covid pneumonia/acute respiratory failure with hypoxia COVID-19 Labs  Recent Labs    02/18/20 0239 02/19/20 0238 02/20/20 0035  DDIMER 1.33* 1.24* 1.25*  FERRITIN 34 40 38  CRP 1.4* 0.7 1.1*    Lab Results  Component Value Date   SARSCOV2NAA POSITIVE (A) 02/08/2020   SARSCOV2NAA NEGATIVE 12/23/2019   SARSCOV2NAA NOT DETECTED 12/07/2019   [place] NEGATIVE 06/18/2019   -Decadron 6 mg daily -Remdesivir completed course - Discharge SNF on 2/21  Acute LEFT lower extremity DVT/complicated by NEW bloody stools. -Continue full dose anticoagulation.  Continue to monitor patient's hemoglobin. -2/20 no sign of rebleed over several days, Apixaban per pharmacy  Lower GI bleed/suspected ischemic colitis secondary to Covid Recent Labs  Lab 02/16/20 0431 02/17/20 0358 02/18/20 0239 02/19/20 0238 02/20/20 0035  HGB 12.2* 12.4* 12.1* 11.5* 10.7*  -H/H stable -Continue Protonix 40 mg  BID  Essential HTN/tachycardia -2/18 Metoprolol [id number] mg BID  Metabolic encephalopathy -[id number] patient appears to wax  and wane yesterday was able to feed himself and answer questions appropriately.  Today confused, needed help with feeding.    High-grade urothelial carcinoma of the bladder -Positive pyuria, negative urinary symptoms. -Culture positive coag negative staph most likely contaminant  UTI -urine positive Aerococcus Urinae/staph Epidermidis, most likely contaminant, with negative fever, negative leukocytosis, most likely contaminant.  Therefore UTI ruled out  -Monitor closely for signs and symptoms  Acute on CKD stage IIIa (baseline Cr 1.39)  Recent Labs  Lab 02/16/20 0431 02/17/20 0358 02/18/20 0239 02/19/20 0238 02/20/20 0035  CREATININE 1.53* 1.39* 1.54* 1.65* 1.29*  -Strict in and out -6.2 L -Daily weight Filed Weights   02/15/20 1032 02/17/20 0536 02/20/20 0414  Weight: 85.6 kg 83.7 kg 87.9 kg  -2/20 decrease D5W 75 ml/hr  Diabetes type 2controlled with complication  -2/9 Hemoglobin A1c = 6.0  -2/18 increase Levemir 16 units BID -2/18 increase NovoLog 10 units qac -Moderate SSI -DC Ensure  Unspecified protein calorie malnutrition -Encourage patient to eat   Hypernatremia -Resolved -See CKD  Hypokalemia -Potassium goal> 4 -K-Dur 50 mEq  Extreme weakness -Per RN yesterday patient able to ambulate from bed to chair, today barely able to raise arms and legs.   -2/17 CT head negative for acute hemorrhage or infarct see results below   Goals of care -2/17 PT/OT recommend SNF.  Awaiting insurance authorization..    DVT prophylaxis: Lovenox therapeutic dose--> Apixaban Code Status: Full Family Communication: 2/19 Spoke [name] (wife) counseled on plan of care answered all questions Disposition Plan: Discharge most likely on Saturday 2/20   Consultants:    Procedures/Significant Events:  2/17 CT head Wo contrast; negative acute intracranial  hemorrhage.  Negative acute infarction      I have personally reviewed and interpreted all radiology studies and my findings are as above.  VENTILATOR SETTINGS: Room air 2/20 SPO2 98%   Cultures 2/8 urine positive Aerococcus Urinae/staph Epidermidis 2/8 SARS coronavirus 2 positive     Antimicrobials: Anti-infectives (From admission, onward)   Start     Dose/Rate Stop   02/09/20 1000  remdesivir 100 mg in sodium chloride 0.9 % 100 mL IVPB     100 mg 200 mL/hr over 30 Minutes 02/12/20 1829   02/09/20 0800  cefTRIAXone (ROCEPHIN) 1 g in sodium chloride 0.9 % 100 mL IVPB  Status:  Discontinued     1 g 200 mL/hr over 30 Minutes 02/10/20 1044   02/08/20 2200  remdesivir 200 mg in sodium chloride 0.9% 250 mL IVPB     200 mg 580 mL/hr over 30 Minutes 02/08/20 2246       Devices    LINES / TUBES:      Continuous Infusions: . sodium chloride    . dextrose 150 mL/hr at 02/20/20 0232     Objective: Vitals:   02/20/20 0514 02/20/20 0600 02/20/20 0828 02/20/20 0900  BP:   118/67   Pulse: 90 85 92   Resp: 19 14 15    Temp:   98 F (36.7 C)   TempSrc:   Oral   SpO2: 92% 94% 92% 95%  Weight:      Height:        Intake/Output Summary (Last 24 hours) at 02/20/2020 1108 Last data filed at 02/19/2020 2245 Gross per 24 hour  Intake 800 ml  Output 900 ml  Net -100 ml   Filed Weights   02/15/20 1032 02/17/20 0536 02/20/20 0414  Weight: 85.6 kg 83.7 kg 87.9 kg   Physical  Exam:  General: Alert, follows some commands, appears to nod yes/no appropriately to questions, no acute respiratory distress Eyes: negative scleral hemorrhage, negative anisocoria, negative icterus ENT: Negative Runny nose, negative gingival bleeding, Neck:  Negative scars, masses, torticollis, lymphadenopathy, JVD Lungs: Clear to auscultation bilaterally without wheezes or crackles Cardiovascular: Regular rate and rhythm without murmur gallop or rub normal S1 and S2 Abdomen: negative abdominal  pain, nondistended, positive soft, bowel sounds, no rebound, no ascites, no appreciable mass Extremities: No significant cyanosis, clubbing, or edema bilateral lower extremities Skin: Negative rashes, lesions, ulcers Psychiatric: Unable to assess patient nonverbal Central nervous system:  Cranial nerves II through XII intact, tongue/uvula midline, all extremities muscle strength 2-3/5, sensation intact throughout, follows some commands, nonverbal.       Data Reviewed: Care during the described time interval was provided by me .  I have reviewed this patient's available data, including medical history, events of note, physical examination, and all test results as part of my evaluation.   CBC: Recent Labs  Lab 02/16/20 0431 02/17/20 0358 02/18/20 0239 02/19/20 0238 02/20/20 0035  WBC 9.0 7.9 5.4 8.6 8.7  NEUTROABS 8.2* 7.0 4.7 6.8 6.8  HGB 12.2* 12.4* 12.1* 11.5* 10.7*  HCT 39.1 39.9 39.8 37.4* 34.8*  MCV 90.9 91.7 92.3 91.4 91.3  PLT 230 177 164 157 [id number]*   Basic Metabolic Panel: Recent Labs  Lab 02/16/20 0431 02/17/20 0358 02/18/20 0239 02/19/20 0238 02/20/20 0035  NA 145 151* 150* 148* 141  K 5.1 4.5 4.7 3.7 3.1*  CL 113* 117* 116* 114* 105  CO2 21* 23 23 24 22   GLUCOSE 356* 267* 343* 193* 170*  BUN 66* 68* 74* 73* 60*  CREATININE 1.53* 1.39* 1.54* 1.65* 1.29*  CALCIUM 9.5 9.7 9.5 9.1 8.8*  MG 2.4 2.3 2.5* 2.4 2.1  PHOS  --  3.8 3.6 3.3 3.7   GFR: Estimated Creatinine Clearance: 55 mL/min (A) (by C-G formula based on SCr of 1.29 mg/dL (H)). Liver Function Tests: Recent Labs  Lab 02/14/20 0050 02/18/20 0239 02/19/20 0238 02/20/20 0035  AST 73* 37 55* 63*  ALT 72* 62* 70* 67*  ALKPHOS 139* 118 115 100  BILITOT 0.8 0.5 0.6 0.5  PROT 7.2 6.9 6.6 6.2*  ALBUMIN 3.0* 3.0* 3.0* 2.8*   No results for input(s): LIPASE, AMYLASE in the last 168 hours. No results for input(s): AMMONIA in the last 168 hours. Coagulation Profile: No results for input(s): INR, PROTIME  in the last 168 hours. Cardiac Enzymes: No results for input(s): CKTOTAL, CKMB, CKMBINDEX, TROPONINI in the last 168 hours. BNP (last 3 results) No results for input(s): PROBNP in the last 8760 hours. HbA1C: No results for input(s): HGBA1C in the last 72 hours. CBG: Recent Labs  Lab 02/16/20 0706 02/16/20 0724 02/16/20 1203 02/16/20 1520 02/16/20 2106  GLUCAP 267* 281* 367* 200* 323*   Lipid Profile: No results for input(s): CHOL, HDL, LDLCALC, TRIG, CHOLHDL, LDLDIRECT in the last 72 hours. Thyroid Function Tests: No results for input(s): TSH, T4TOTAL, FREET4, T3FREE, THYROIDAB in the last 72 hours. Anemia Panel: Recent Labs    02/19/20 0238 02/20/20 0035  FERRITIN 40 38   Urine analysis:    Component Value Date/Time   COLORURINE YELLOW 02/08/2020 1545   APPEARANCEUR CLOUDY (A) 02/08/2020 1545   LABSPEC 1.012 02/08/2020 1545   PHURINE 6.0 02/08/2020 1545   GLUCOSEU NEGATIVE 02/08/2020 1545   HGBUR NEGATIVE 02/08/2020 1545   BILIRUBINUR NEGATIVE 02/08/2020 1545   BILIRUBINUR n 06/08/2016 1109  KETONESUR NEGATIVE 02/08/2020 1545   PROTEINUR NEGATIVE 02/08/2020 1545   UROBILINOGEN negative 06/08/2016 1109   NITRITE NEGATIVE 02/08/2020 1545   LEUKOCYTESUR LARGE (A) 02/08/2020 1545   Sepsis Labs: @LABRCNTIP (procalcitonin:4,lacticidven:4)  ) No results found for this or any previous visit (from the past 240 hour(s)).       Radiology Studies: No results found.      Scheduled Meds: . vitamin C  500 mg Oral Daily  . chlorpheniramine-HYDROcodone  5 mL Oral Q12H  . enoxaparin (LOVENOX) injection  85 mg Subcutaneous Q12H  . insulin aspart  0-15 Units Subcutaneous Q4H  . insulin aspart  10 Units Subcutaneous TID WC  . insulin detemir  16 Units Subcutaneous BID  . Ipratropium-Albuterol  1 puff Inhalation Q6H  . metoprolol tartrate  12.5 mg Oral BID  . multivitamin with minerals  1 tablet Oral Daily  . nystatin  5 mL Oral QID  . pantoprazole (PROTONIX) IV   40 mg Intravenous Q12H  . sodium chloride flush  3 mL Intravenous Once  . sodium chloride flush  3 mL Intravenous Q12H  . sodium zirconium cyclosilicate  10 g Oral TID  . thiamine  100 mg Oral Daily  . zinc sulfate  220 mg Oral Daily   Continuous Infusions: . sodium chloride    . dextrose 150 mL/hr at 02/20/20 0232     LOS: 12 days   The patient is critically ill with multiple organ systems failure and requires high complexity decision making for assessment and support, frequent evaluation and titration of therapies, application of advanced monitoring technologies and extensive interpretation of multiple databases. Critical Care Time devoted to patient care services described in this note  Time spent: 40 minutes     Nasim Habeeb, Geraldo Docker, MD [facility] Pager [phone]  If 7PM-7AM, please contact night-coverage [email or web address] Password [email or web address] 02/20/2020, 11:08 AM

## 2020-02-21 DIAGNOSIS — K921 Melena: Secondary | ICD-10-CM | POA: Diagnosis not present

## 2020-02-21 DIAGNOSIS — C679 Malignant neoplasm of bladder, unspecified: Secondary | ICD-10-CM | POA: Diagnosis not present

## 2020-02-21 DIAGNOSIS — J9600 Acute respiratory failure, unspecified whether with hypoxia or hypercapnia: Secondary | ICD-10-CM | POA: Diagnosis not present

## 2020-02-21 DIAGNOSIS — R490 Dysphonia: Secondary | ICD-10-CM | POA: Diagnosis not present

## 2020-02-21 DIAGNOSIS — E118 Type 2 diabetes mellitus with unspecified complications: Secondary | ICD-10-CM | POA: Diagnosis not present

## 2020-02-21 DIAGNOSIS — E46 Unspecified protein-calorie malnutrition: Secondary | ICD-10-CM | POA: Diagnosis not present

## 2020-02-21 DIAGNOSIS — D649 Anemia, unspecified: Secondary | ICD-10-CM | POA: Diagnosis not present

## 2020-02-21 DIAGNOSIS — R279 Unspecified lack of coordination: Secondary | ICD-10-CM | POA: Diagnosis not present

## 2020-02-21 DIAGNOSIS — M6389 Disorders of muscle in diseases classified elsewhere, multiple sites: Secondary | ICD-10-CM | POA: Diagnosis not present

## 2020-02-21 DIAGNOSIS — U071 COVID-19: Secondary | ICD-10-CM | POA: Diagnosis not present

## 2020-02-21 DIAGNOSIS — I2510 Atherosclerotic heart disease of native coronary artery without angina pectoris: Secondary | ICD-10-CM | POA: Diagnosis not present

## 2020-02-21 DIAGNOSIS — I469 Cardiac arrest, cause unspecified: Secondary | ICD-10-CM | POA: Diagnosis not present

## 2020-02-21 DIAGNOSIS — I10 Essential (primary) hypertension: Secondary | ICD-10-CM | POA: Diagnosis not present

## 2020-02-21 DIAGNOSIS — R262 Difficulty in walking, not elsewhere classified: Secondary | ICD-10-CM | POA: Diagnosis not present

## 2020-02-21 DIAGNOSIS — I82402 Acute embolism and thrombosis of unspecified deep veins of left lower extremity: Secondary | ICD-10-CM | POA: Diagnosis not present

## 2020-02-21 DIAGNOSIS — R52 Pain, unspecified: Secondary | ICD-10-CM | POA: Diagnosis not present

## 2020-02-21 DIAGNOSIS — R41841 Cognitive communication deficit: Secondary | ICD-10-CM | POA: Diagnosis not present

## 2020-02-21 DIAGNOSIS — Z743 Need for continuous supervision: Secondary | ICD-10-CM | POA: Diagnosis not present

## 2020-02-21 DIAGNOSIS — R062 Wheezing: Secondary | ICD-10-CM | POA: Diagnosis not present

## 2020-02-21 DIAGNOSIS — R7989 Other specified abnormal findings of blood chemistry: Secondary | ICD-10-CM | POA: Diagnosis not present

## 2020-02-21 DIAGNOSIS — E119 Type 2 diabetes mellitus without complications: Secondary | ICD-10-CM | POA: Diagnosis not present

## 2020-02-21 DIAGNOSIS — I82401 Acute embolism and thrombosis of unspecified deep veins of right lower extremity: Secondary | ICD-10-CM | POA: Diagnosis not present

## 2020-02-21 DIAGNOSIS — M6281 Muscle weakness (generalized): Secondary | ICD-10-CM | POA: Diagnosis not present

## 2020-02-21 DIAGNOSIS — J1282 Pneumonia due to coronavirus disease 2019: Secondary | ICD-10-CM | POA: Diagnosis not present

## 2020-02-21 DIAGNOSIS — R1312 Dysphagia, oropharyngeal phase: Secondary | ICD-10-CM | POA: Diagnosis not present

## 2020-02-21 DIAGNOSIS — G9341 Metabolic encephalopathy: Secondary | ICD-10-CM | POA: Diagnosis not present

## 2020-02-21 DIAGNOSIS — E1169 Type 2 diabetes mellitus with other specified complication: Secondary | ICD-10-CM | POA: Diagnosis not present

## 2020-02-21 DIAGNOSIS — K558 Other vascular disorders of intestine: Secondary | ICD-10-CM | POA: Diagnosis not present

## 2020-02-21 DIAGNOSIS — E870 Hyperosmolality and hypernatremia: Secondary | ICD-10-CM | POA: Diagnosis not present

## 2020-02-21 DIAGNOSIS — E872 Acidosis: Secondary | ICD-10-CM | POA: Diagnosis not present

## 2020-02-21 DIAGNOSIS — N1831 Chronic kidney disease, stage 3a: Secondary | ICD-10-CM | POA: Diagnosis not present

## 2020-02-21 DIAGNOSIS — E1159 Type 2 diabetes mellitus with other circulatory complications: Secondary | ICD-10-CM | POA: Diagnosis not present

## 2020-02-21 DIAGNOSIS — J9601 Acute respiratory failure with hypoxia: Secondary | ICD-10-CM | POA: Diagnosis not present

## 2020-02-21 DIAGNOSIS — R531 Weakness: Secondary | ICD-10-CM | POA: Diagnosis not present

## 2020-02-21 DIAGNOSIS — I82432 Acute embolism and thrombosis of left popliteal vein: Secondary | ICD-10-CM | POA: Diagnosis not present

## 2020-02-21 LAB — CBC WITH DIFFERENTIAL/PLATELET
Abs Immature Granulocytes: 0.21 10*3/uL — ABNORMAL HIGH (ref 0.00–0.07)
Basophils Absolute: 0 10*3/uL (ref 0.0–0.1)
Basophils Relative: 0 %
Eosinophils Absolute: 0.1 10*3/uL (ref 0.0–0.5)
Eosinophils Relative: 2 %
HCT: 33.1 % — ABNORMAL LOW (ref 39.0–52.0)
Hemoglobin: 10.4 g/dL — ABNORMAL LOW (ref 13.0–17.0)
Immature Granulocytes: 3 %
Lymphocytes Relative: 8 %
Lymphs Abs: 0.6 10*3/uL — ABNORMAL LOW (ref 0.7–4.0)
MCH: 28.7 pg (ref 26.0–34.0)
MCHC: 31.4 g/dL (ref 30.0–36.0)
MCV: 91.2 fL (ref 80.0–100.0)
Monocytes Absolute: 0.5 10*3/uL (ref 0.1–1.0)
Monocytes Relative: 7 %
Neutro Abs: 5.7 10*3/uL (ref 1.7–7.7)
Neutrophils Relative %: 80 %
Platelets: 112 10*3/uL — ABNORMAL LOW (ref 150–400)
RBC: 3.63 MIL/uL — ABNORMAL LOW (ref 4.22–5.81)
RDW: 16.3 % — ABNORMAL HIGH (ref 11.5–15.5)
WBC: 7.1 10*3/uL (ref 4.0–10.5)
nRBC: 0.3 % — ABNORMAL HIGH (ref 0.0–0.2)

## 2020-02-21 LAB — C-REACTIVE PROTEIN: CRP: 0.9 mg/dL (ref <1.0)

## 2020-02-21 LAB — COMPREHENSIVE METABOLIC PANEL
ALT: 59 U/L — ABNORMAL HIGH (ref 0–44)
AST: 59 U/L — ABNORMAL HIGH (ref 15–41)
Albumin: 2.7 g/dL — ABNORMAL LOW (ref 3.5–5.0)
Alkaline Phosphatase: 96 U/L (ref 38–126)
Anion gap: 9 (ref 5–15)
BUN: 43 mg/dL — ABNORMAL HIGH (ref 8–23)
CO2: 25 mmol/L (ref 22–32)
Calcium: 8.7 mg/dL — ABNORMAL LOW (ref 8.9–10.3)
Chloride: 106 mmol/L (ref 98–111)
Creatinine, Ser: 1.15 mg/dL (ref 0.61–1.24)
GFR calc Af Amer: >60 mL/min (ref >60)
GFR calc non Af Amer: >60 mL/min (ref >60)
Glucose, Bld: 101 mg/dL — ABNORMAL HIGH (ref 70–99)
Potassium: 3.7 mmol/L (ref 3.5–5.1)
Sodium: 140 mmol/L (ref 135–145)
Total Bilirubin: 0.5 mg/dL (ref 0.3–1.2)
Total Protein: 5.6 g/dL — ABNORMAL LOW (ref 6.5–8.1)

## 2020-02-21 LAB — FERRITIN: Ferritin: 40 ng/mL (ref 24–336)

## 2020-02-21 LAB — MAGNESIUM: Magnesium: 2.1 mg/dL (ref 1.7–2.4)

## 2020-02-21 LAB — D-DIMER, QUANTITATIVE: D-Dimer, Quant: 1.08 ug/mL-FEU — ABNORMAL HIGH (ref 0.00–0.50)

## 2020-02-21 LAB — PHOSPHORUS: Phosphorus: 3.6 mg/dL (ref 2.5–4.6)

## 2020-02-21 MED ORDER — IPRATROPIUM-ALBUTEROL 20-100 MCG/ACT IN AERS: 1.0000 | INHALATION_SPRAY | Freq: Four times a day (QID) | RESPIRATORY_TRACT | 0 refills | Status: DC

## 2020-02-21 MED ORDER — THIAMINE HCL 100 MG PO TABS: 100.0000 mg | ORAL_TABLET | Freq: Every day | ORAL | 0 refills | Status: DC

## 2020-02-21 MED ORDER — ONDANSETRON HCL 4 MG PO TABS: 4.0000 mg | ORAL_TABLET | Freq: Four times a day (QID) | ORAL | 0 refills | Status: DC | PRN

## 2020-02-21 MED ORDER — RESOURCE THICKENUP CLEAR PO POWD: ORAL | 0 refills | Status: DC

## 2020-02-21 MED ORDER — INSULIN PEN NEEDLE 29G X 12MM MISC: 0 refills | Status: DC

## 2020-02-21 MED ORDER — SENNOSIDES-DOCUSATE SODIUM 8.6-50 MG PO TABS: 2.0000 | ORAL_TABLET | Freq: Every evening | ORAL | 0 refills | Status: DC | PRN

## 2020-02-21 MED ORDER — METOPROLOL TARTRATE 25 MG PO TABS: 12.5000 mg | ORAL_TABLET | Freq: Two times a day (BID) | ORAL | 0 refills | Status: DC

## 2020-02-21 MED ORDER — ACETAMINOPHEN 325 MG PO TABS: 650.0000 mg | ORAL_TABLET | Freq: Four times a day (QID) | ORAL | 0 refills | Status: DC | PRN

## 2020-02-21 MED ORDER — PANTOPRAZOLE SODIUM 40 MG PO TBEC: 40.0000 mg | DELAYED_RELEASE_TABLET | Freq: Two times a day (BID) | ORAL | 0 refills | Status: DC

## 2020-02-21 MED ORDER — GUAIFENESIN-DM 100-10 MG/5ML PO SYRP: 10.0000 mL | ORAL_SOLUTION | ORAL | 0 refills | Status: DC | PRN

## 2020-02-21 MED ORDER — NYSTATIN 100000 UNIT/ML MT SUSP: 5.0000 mL | Freq: Four times a day (QID) | OROMUCOSAL | 0 refills | Status: DC

## 2020-02-21 MED ORDER — INSULIN DETEMIR 100 UNIT/ML FLEXPEN: 16.0000 [IU] | PEN_INJECTOR | Freq: Two times a day (BID) | SUBCUTANEOUS | 0 refills | Status: DC

## 2020-02-21 MED ORDER — APIXABAN 5 MG PO TABS: 5.0000 mg | ORAL_TABLET | Freq: Two times a day (BID) | ORAL | 0 refills | Status: DC

## 2020-02-21 MED ORDER — POLYETHYLENE GLYCOL 3350 17 G PO PACK: 17.0000 g | PACK | Freq: Every day | ORAL | 0 refills | Status: DC | PRN

## 2020-02-21 MED ORDER — ALBUTEROL SULFATE HFA 108 (90 BASE) MCG/ACT IN AERS: 2.0000 | INHALATION_SPRAY | RESPIRATORY_TRACT | 0 refills | Status: DC | PRN

## 2020-02-21 MED ORDER — ZINC SULFATE 220 (50 ZN) MG PO CAPS: 220.0000 mg | ORAL_CAPSULE | Freq: Every day | ORAL | 0 refills | Status: DC

## 2020-02-21 MED ORDER — ASCORBIC ACID 500 MG PO TABS: 500.0000 mg | ORAL_TABLET | Freq: Every day | ORAL | 0 refills | Status: DC

## 2020-02-21 MED ORDER — ADULT MULTIVITAMIN W/MINERALS CH: 1.0000 | ORAL_TABLET | Freq: Every day | ORAL | 0 refills | Status: AC

## 2020-02-21 MED ORDER — INSULIN ASPART 100 UNIT/ML FLEXPEN: 10.0000 [IU] | PEN_INJECTOR | Freq: Three times a day (TID) | SUBCUTANEOUS | 0 refills | Status: DC

## 2020-02-21 NOTE — Progress Notes (Signed)
Report given to [facility] at [facility]. Patient is educated on d/c information and he states understanding. Belongings at bedside and will be sent with patient upon transfer. VSS, no signs of distress.

## 2020-02-21 NOTE — TOC Transition Note (Signed)
Transition of Care [facility]) - CM/SW Discharge Note   Patient Details  Name: Jimmy [name] MRN: [id number] Date of Birth: 1946/04/09  Transition of Care [facility]) CM/SW Contact:  Bartholomew Crews, RN Phone Number: [phone] 02/21/2020, 10:35 AM   Clinical Narrative:    NCM notified Isaias Cowman of DC summary. Patient to go to [facility]. Nurse to call report to [phone]. Notified patient's daughter, Janett Billow, of transition today. [facility] arranged for transport and advised to pull around to the back of building. No further TOC needs identified at this time.    Final next level of care: Skilled Nursing Facility Barriers to Discharge: No Barriers Identified   Patient Goals and CMS Choice Patient states their goals for this hospitalization and ongoing recovery are:: To go to SNF for short term rehab per patient's daughter and wife. CMS Medicare.gov Compare Post Acute Care list provided to:: Patient Represenative (must comment) Choice offered to / list presented to : Spouse, Adult Children  Discharge Placement              Patient [facility] bed at: [facility] Patient to be transferred to facility by: [place] Name of family member notified: Janett Billow Patient and family notified of of transfer: 02/21/20  Discharge Plan and Services In-house Referral: Clinical Social Work   Post Acute Care Choice: [place]                               Social Determinants of Health (SDOH) Interventions     Readmission Risk Interventions Readmission Risk Prevention Plan 02/15/2020  Transportation Screening Complete  PCP or Specialist Appt within 3-5 Days Complete  HRI or [place] Complete  Social Work Consult for [place] Planning/Counseling Complete  Palliative Care Screening Not Complete  Palliative Care Screening Not Complete Comments Palliative consult not requested by physician  Medication Review (RN Care Manager) Referral to Pharmacy  Some recent data might  be hidden

## 2020-02-21 NOTE — Discharge Summary (Signed)
Physician Discharge Summary  KEATAN VANDEVOORT [id number] DOB: 1946/04/05 DOA: 02/08/2020  PCP: [name], MD  Admit date: 02/08/2020 Discharge date: 02/21/2020  Time spent: 30 minutes  Recommendations for Outpatient Follow-up:  Covid pneumonia/acute respiratory failure with hypoxia COVID-19 Labs  Recent Labs    02/19/20 0238 02/20/20 0035 02/21/20 0234  DDIMER 1.24* 1.25* 1.08*  FERRITIN 40 38 40  CRP 0.7 1.1* 0.9    Lab Results  Component Value Date   SARSCOV2NAA POSITIVE (A) 02/08/2020   SARSCOV2NAA NEGATIVE 12/23/2019   SARSCOV2NAA NOT DETECTED 12/07/2019   [place] NEGATIVE 06/18/2019   -Decadron 6 mg daily (complete) -Remdesivir completed course - Discharge SNF on 2/21  Acute LEFT lower extremity DVT/complicated by NEW bloody stools. -Continue full dose anticoagulation.  Continue to monitor patient's hemoglobin. -2/20 no sign of rebleed over several days, Apixaban per pharmacy  Lower GI bleed/suspected ischemic colitis secondary to Covid Recent Labs  Lab 02/17/20 0358 02/18/20 0239 02/19/20 0238 02/20/20 0035 02/21/20 0234  HGB 12.4* 12.1* 11.5* 10.7* 10.4*  -H/H stable -Continue Protonix 40 mg BID  Essential HTN/tachycardia -2/18 Metoprolol [id number] mg BID  Metabolic encephalopathy -[id number] patient appears to wax and wane yesterday was able to feed himself and answer questions appropriately.  Today confused, needed help with feeding.    High-grade urothelial carcinoma of the bladder -Positive pyuria, negative urinary symptoms. -Culture positive coag negative staph most likely contaminant  UTI -urine positive Aerococcus Urinae/staph Epidermidis, most likely contaminant, with negative fever, negative leukocytosis, most likely contaminant.  Therefore UTI ruled out  -Monitor closely for signs and symptoms  Acute on CKD stage IIIa (baseline Cr 1.39)  Recent Labs  Lab 02/17/20 0358 02/18/20 0239 02/19/20 0238 02/20/20 0035 02/21/20 0234   CREATININE 1.39* 1.54* 1.65* 1.29* 1.15  -Better than baseline -Strict in and out -5.5 L -Daily weight Filed Weights   02/17/20 0536 02/20/20 0414 02/21/20 0500  Weight: 83.7 kg 87.9 kg 89 kg    Diabetes type 2controlled with complication  -2/9 Hemoglobin A1c = 6.0  -2/18 increase Levemir 16 units BID -2/18 increase NovoLog 10 units qac -DC Ensure  Unspecified protein calorie malnutrition -Encourage patient to eat   Hypernatremia -Resolved -See CKD  Hypokalemia -Potassium goal> 4  Extreme weakness -Per RN yesterday patient able to ambulate from bed to chair, today barely able to raise arms and legs.   -2/17 CT head negative for acute hemorrhage or infarct see results below   Discharge Diagnoses:  Principal Problem:   Pneumonia due to COVID-19 virus Active Problems:   Hypertension associated with type 2 diabetes mellitus ([place])   Malignant neoplasm of urinary bladder ([place])   Acute respiratory failure due to COVID-19 [facility])   Discharge Condition: Stable  Diet recommendation: Carb modified   Filed Weights   02/17/20 0536 02/20/20 0414 02/21/20 0500  Weight: 83.7 kg 87.9 kg 89 kg    History of present illness:  74 year old WM PMHx Dyspnea.  CKD, diabetes type 2 uncontrolled with complication, OSA uses CPAP HTN, HLD, bladder cancer, nephrolithiasis, prostate cancer s/p seed implant + XRT CAD, obesity,  Patient reported not feeling well for several days at home, having dyspnea and cough. Home health nurse noticed him to be more confused having increased oxygen requirements. On his initial physical examination in the emergency department he was in respiratory distress and placed on nonrebreather mask,with oxygen saturation 90%, his blood pressure was 129/79, heart rate 102, respiratory rate28.His lungs were clear to auscultation, no wheezing or rhonchi, heart  S1-S2, present with, abdomen was soft and nontender, no lower extremity edema.Urinalysis specific  gravity 1.012, 0-5 red cells, 11-20 white cells, large leukocytes. Chest radiograph with left lower lobe interstitial infiltrate, increased bilateral markings. CT chest negative for pulmonary embolism, bilateral apical groundglass opacities bilateral subpleural groundglass infiltrates.  Patient has received supplemental oxygen per high flow nasal cannula, medical therapy with remdesivir and dexamethasone. For persistent hypoxic respiratory failure he received SARS COVID-19 convalescent plasma.  He continued to have high oxygen requirements. Ultrasonography lower extremities was positive for deep vein thrombosis on the left.  Patient responded well to diuresis for non cardiogenic pulmonary edema with improvement of oxygenation.  Urothelial cancer: 06/22/2019. SP TURB with bilateralureteral stentsplacement/final pathology showed high-grade infiltrating papillary adenocarcinoma, invading into the detrusor muscle with lymphovascular invasion. .  08/07/2019 patient underwent neoadjuvant chemotherapy,4 cycles completed October 16, 2019. 12/2019 underwent radical cystectomy and lymph node dissection.   Final pathology showed 1.5 cm residual tumor with invasion into the perivascular soft tissue. Final pathology stage T38 and 0 with 0 out of 10 lymph nodes involved.Currently under active surveillance.  Patient is very weak and deconditioned, poor oral intake. Poorly responsive. Had bloody stools and anticoagulation was heldfor 24 H.   Enoxaparinhave been resumed, but if recurrentbleeding patientmay need IVC filter.  Hospital Course:  Treated for Covid pneumonia/acute respiratory failure with hypoxia with standard protocol responded well.  Hospital course complicated by acute LEFT lower extremity DVT patient was placed on appropriate therapeutic antibiotic will be discharged on apixaban.  Patient's course also complicated by lower GI bleed which has resolved.  Stable for  discharge    Procedures: 2/17 CT head Wo contrast; negative acute intracranial hemorrhage.  Negative acute infarction  VENTILATOR SETTINGS: Room air 2/21 SPO2 90%   Cultures  2/8 urine positive Aerococcus Urinae/staph Epidermidis 2/8 SARS coronavirus 2 positive    Antibiotics Anti-infectives (From admission, onward)   Start     Dose/Rate Stop   02/09/20 1000  remdesivir 100 mg in sodium chloride 0.9 % 100 mL IVPB     100 mg 200 mL/hr over 30 Minutes 02/12/20 1829   02/09/20 0800  cefTRIAXone (ROCEPHIN) 1 g in sodium chloride 0.9 % 100 mL IVPB  Status:  Discontinued     1 g 200 mL/hr over 30 Minutes 02/10/20 1044   02/08/20 2200  remdesivir 200 mg in sodium chloride 0.9% 250 mL IVPB     200 mg 580 mL/hr over 30 Minutes 02/08/20 2246       Discharge Exam: Vitals:   02/21/20 0013 02/21/20 0353 02/21/20 0500 02/21/20 0719  BP: 99/60 106/67  119/62  Pulse: 72 78    Resp: 19 15  20   Temp: 98.3 F (36.8 C) 98.4 F (36.9 C)  97.6 F (36.4 C)  TempSrc: Oral Oral  Oral  SpO2: 96% 93%  90%  Weight:   89 kg   Height:        General: Alert, follows some commands, appears to nod yes/no appropriately to questions, no acute respiratory distress Eyes: negative scleral hemorrhage, negative anisocoria, negative icterus ENT: Negative Runny nose, negative gingival bleeding, Neck:  Negative scars, masses, torticollis, lymphadenopathy, JVD Lungs: Clear to auscultation bilaterally without wheezes or crackles Cardiovascular: Regular rate and rhythm without murmur gallop or rub normal S1 and S2   Discharge Instructions   Allergies as of 02/21/2020      Reactions   Ace Inhibitors Cough   Cephalexin Hives, Diarrhea, Nausea Only   Tolerates Ancef, Rocephin,  Cefoxitin   Crestor [rosuvastatin Calcium]    Lipitor [atorvastatin Calcium] Other (See Comments)   MUSCLE PAINS   Plavix [clopidogrel Bisulfate] Hives   Vancomycin Nausea And Vomiting   Po route caused n/v.        Medication List    STOP taking these medications   enoxaparin 40 MG/0.4ML injection Commonly known as: LOVENOX   fidaxomicin 200 MG Tabs tablet Commonly known as: DIFICID   losartan 100 MG tablet Commonly known as: COZAAR   metFORMIN 1000 MG tablet Commonly known as: GLUCOPHAGE   Pitavastatin Calcium 4 MG Tabs Commonly known as: Livalo   traMADol 50 MG tablet Commonly known as: ULTRAM   Victoza 18 MG/3ML Sopn Generic drug: liraglutide     TAKE these medications   acetaminophen 325 MG tablet Commonly known as: TYLENOL Take 2 tablets (650 mg total) by mouth every 6 (six) hours as needed for mild pain or headache (fever >/= 101).   albuterol 108 (90 Base) MCG/ACT inhaler Commonly known as: VENTOLIN HFA Inhale 2 puffs into the lungs every 4 (four) hours as needed for wheezing or shortness of breath.   apixaban 5 MG Tabs tablet Commonly known as: ELIQUIS Take 1 tablet (5 mg total) by mouth 2 (two) times daily.   ascorbic acid 500 MG tablet Commonly known as: VITAMIN C Take 1 tablet (500 mg total) by mouth daily.   guaiFENesin-dextromethorphan 100-10 MG/5ML syrup Commonly known as: ROBITUSSIN DM Take 10 mLs by mouth every 4 (four) hours as needed for cough.   insulin aspart 100 UNIT/ML FlexPen Commonly known as: NOVOLOG Inject 10 Units into the skin 3 (three) times daily with meals.   Insulin Detemir 100 UNIT/ML Pen Commonly known as: LEVEMIR Inject 16 Units into the skin 2 (two) times daily.   Insulin Pen Needle 29G X 12MM Misc Use as directed   Ipratropium-Albuterol 20-100 MCG/ACT Aers respimat Commonly known as: COMBIVENT Inhale 1 puff into the lungs every 6 (six) hours.   metoprolol tartrate 25 MG tablet Commonly known as: LOPRESSOR Take 0.5 tablets (12.5 mg total) by mouth 2 (two) times daily.   multivitamin with minerals Tabs tablet Take 1 tablet by mouth daily.   nystatin 100000 UNIT/ML suspension Commonly known as: MYCOSTATIN Take 5 mLs  (500,000 Units total) by mouth 4 (four) times daily.   ondansetron 4 MG tablet Commonly known as: ZOFRAN Take 1 tablet (4 mg total) by mouth every 6 (six) hours as needed for nausea.   ONE TOUCH ULTRA TEST test strip Generic drug: glucose blood USE TO TEST BLOOD SUGAR DAILY AS DIRECTED BY DOCTOR   onetouch ultrasoft lancets 1 each by Other route as needed. Use as instructed   pantoprazole 40 MG tablet Commonly known as: PROTONIX Take 1 tablet (40 mg total) by mouth 2 (two) times daily.   polyethylene glycol 17 g packet Commonly known as: MIRALAX / GLYCOLAX Take 17 g by mouth daily as needed for moderate constipation or severe constipation.   Resource ThickenUp Clear Powd Use as directed   senna-docusate 8.6-50 MG tablet Commonly known as: Senokot-S Take 2 tablets by mouth at bedtime as needed for mild constipation or moderate constipation.   thiamine 100 MG tablet Take 1 tablet (100 mg total) by mouth daily.   zinc sulfate 220 (50 Zn) MG capsule Take 1 capsule (220 mg total) by mouth daily.      Allergies  Allergen Reactions  . Ace Inhibitors Cough  . Cephalexin Hives, Diarrhea and Nausea Only  Tolerates Ancef, Rocephin, Cefoxitin  . Crestor [Rosuvastatin Calcium]   . Lipitor [Atorvastatin Calcium] Other (See Comments)    MUSCLE PAINS   . Plavix [Clopidogrel Bisulfate] Hives  . Vancomycin Nausea And Vomiting    Po route caused n/v.       The results of significant diagnostics from this hospitalization (including imaging, microbiology, ancillary and laboratory) are listed below for reference.    Significant Diagnostic Studies: DG Chest 1 View  Result Date: 02/10/2020 CLINICAL DATA:  Dyspnea EXAM: CHEST  1 VIEW COMPARISON:  CTA chest dated 02/08/2020 FINDINGS: Multifocal subpleural patchy opacities in the lungs bilaterally, reflecting multifocal pneumonia, grossly unchanged. No pleural effusion or pneumothorax. The heart is normal in size. IMPRESSION:  Multifocal pneumonia in this patient with known COVID, grossly unchanged. Electronically Signed   By: Julian Hy M.D.   On: 02/10/2020 10:27   CT HEAD WO CONTRAST  Result Date: 02/17/2020 CLINICAL DATA:  Ataxia EXAM: CT HEAD WITHOUT CONTRAST TECHNIQUE: Contiguous axial images were obtained from the base of the skull through the vertex without intravenous contrast. COMPARISON:  02/08/2020 FINDINGS: Brain: There is no acute intracranial hemorrhage, mass-effect, or edema. There is no new loss of gray-white differentiation. There is no extra-axial fluid collection. Ventricles and sulci are within normal limits in size and configuration. Patchy hypoattenuation the supratentorial white matter is nonspecific but may reflect mild chronic microvascular ischemic changes. Vascular: No hyperdense vessel or unexpected calcification. Skull: Calvarium is unremarkable. Sinuses/Orbits: No acute finding. Other: None. IMPRESSION: No acute intracranial hemorrhage, mass effect, or evidence of acute infarction. Electronically Signed   By: Macy Mis M.D.   On: 02/17/2020 14:29   CT Head Wo Contrast  Result Date: 02/08/2020 CLINICAL DATA:  Altered mental status EXAM: CT HEAD WITHOUT CONTRAST TECHNIQUE: Contiguous axial images were obtained from the base of the skull through the vertex without intravenous contrast. COMPARISON:  MRI 06/04/2017 FINDINGS: Brain: There is atrophy and chronic small vessel disease changes. No acute intracranial abnormality. Specifically, no hemorrhage, hydrocephalus, mass lesion, acute infarction, or significant intracranial injury. Vascular: No hyperdense vessel or unexpected calcification. Skull: No acute calvarial abnormality. Sinuses/Orbits: Visualized paranasal sinuses and mastoids clear. Orbital soft tissues unremarkable. Other: None IMPRESSION: Atrophy, chronic microvascular disease. No acute intracranial abnormality. Electronically Signed   By: Rolm Baptise M.D.   On: 02/08/2020 19:08    CT Angio Chest PE W and/or Wo Contrast  Result Date: 02/08/2020 CLINICAL DATA:  High probability pulmonary embolus. History of cancer. New shortness of breath with hypoxia and tachycardia. Recent RIGHT calf pain. Two weeks of confusion and weakness. EXAM: CT ANGIOGRAPHY CHEST WITH CONTRAST TECHNIQUE: Multidetector CT imaging of the chest was performed using the standard protocol during bolus administration of intravenous contrast. Multiplanar CT image reconstructions and MIPs were obtained to evaluate the vascular anatomy. CONTRAST:  61mL OMNIPAQUE IOHEXOL 350 MG/ML SOLN COMPARISON:  11/02/2019 FINDINGS: Cardiovascular: Pulmonary arteries are well opacified by contrast bolus. There is no acute pulmonary embolus. Heart size is within normal limits. There is atherosclerotic calcification of the thoracic aorta, not associated with aneurysm. No pericardial effusion. Mediastinum/Nodes: Esophagus is unremarkable. No significant mediastinal, hilar, or axillary adenopathy. Lungs/Pleura: Again noted are scattered areas of paraseptal emphysema, vertically within the lung apices and are subpleural reticulation. Superimposed on the chronic changes are patchy ground-glass opacities with a peripheral predominance, favoring infectious process over pulmonary edema. No pleural effusions. No frank consolidations. No suspicious pulmonary nodules. Upper Abdomen: No acute abnormality. Musculoskeletal: Midthoracic degenerative changes. Review of the  MIP images confirms the above findings. IMPRESSION: 1. Technically adequate exam showing no acute pulmonary embolus. 2. Chronic changes of paraseptal emphysema and subpleural reticulation. 3. Patchy ground-glass opacities with a peripheral predominance, favoring infectious process over pulmonary edema. 4. Aortic Atherosclerosis (ICD10-I70.0). 5. Emphysema (ICD10-J43.9). Electronically Signed   By: Nolon Nations M.D.   On: 02/08/2020 19:14   DG Chest Portable 1 View  Result Date:  02/08/2020 CLINICAL DATA:  Short of breath, fatigue, altered level of consciousness EXAM: PORTABLE CHEST 1 VIEW COMPARISON:  12/23/2019 FINDINGS: Single frontal view of the chest demonstrates a stable cardiac silhouette. Diffuse interstitial scarring is again seen throughout the lungs without focal consolidation, effusion, or pneumothorax. There are no acute bony abnormalities. IMPRESSION: 1. Chronic parenchymal scarring and fibrosis. No acute airspace disease. Electronically Signed   By: Randa Ngo M.D.   On: 02/08/2020 16:08   DG Swallowing Func-Speech Pathology  Result Date: 02/16/2020 Objective Swallowing Evaluation: Type of Study: MBS-Modified Barium Swallow Study  Patient Details Name: OSA BARTNIK MRN: [id number] Date of Birth: 1946-09-11 Today's Date: 02/16/2020 Time: SLP Start Time (ACUTE ONLY): 0930 -SLP Stop Time (ACUTE ONLY): 1015 SLP Time Calculation (min) (ACUTE ONLY): 45 min Past Medical History: Past Medical History: Diagnosis Date . Astigmatism of both eyes 11/01/2016 . Bronchitis  . Coronary artery disease  . Cortical age-related cataract of both eyes 11/01/2016 . Diabetes mellitus without complication ([place])   type 2 . Drug-induced osteoporosis  . Dyslipidemia  . Fatigue  . Fatty liver  . History of kidney stones  . Hypercholesteremia  . Hypogonadism male  . Ischemic heart disease   s/p cath in 2000 showing nonobstructive CAD yet felt to have had a probable dissection of the RCA . Normal nuclear stress test March 2011 . Nuclear sclerotic cataract of both eyes 11/01/2016 . Obesity  . Prostate cancer [facility])   s/p seed implant and radiation . Sleep apnea   90% of time dosent wear his cpap per pt . Vitamin D insufficiency  Past Surgical History: Past Surgical History: Procedure Laterality Date . BUBBLE STUDY  12/30/2019  Procedure: BUBBLE STUDY;  Surgeon: Pixie Casino, MD;  Location: [place];  Service: Cardiovascular;; . CARDIAC CATHETERIZATION  April 2000 . IR IMAGING GUIDED PORT  INSERTION  07/30/2019 . IR REMOVAL TUN ACCESS W/ PORT W/O FL MOD SED  12/26/2019 . RADIOACTIVE SEED IMPLANT  2009  Implanted radiation seeds . TEE WITHOUT CARDIOVERSION N/A 12/30/2019  Procedure: TRANSESOPHAGEAL ECHOCARDIOGRAM (TEE);  Surgeon: Pixie Casino, MD;  Location: [facility] ENDOSCOPY;  Service: Cardiovascular;  Laterality: N/A; . thumb surgery    as a teen caught thumb in machine . TRANSURETHRAL RESECTION OF BLADDER TUMOR N/A 06/22/2019  Procedure: TRANSURETHRAL RESECTION OF BLADDER TUMOR (TURBT), CYSTOSCOPY,  BILATERAL RETROGRADE PYELOGRAPHY, BILATERAL STENT PLACEMENT;  Surgeon: Raynelle Bring, MD;  Location: [facility] ORS;  Service: Urology;  Laterality: N/A;  GENERAL WITH PARALYSIS HPI: 74 year old male admitted with COVID/PNA; PMH bladder cancer, OSA, DM, bronchitis, CAD. OT  witnessed pt coughing with 3/4 sips water.  No data recorded Assessment / Plan / Recommendation CHL IP CLINICAL IMPRESSIONS 02/16/2020 Clinical Impression Pt demonstrates moderate oral impairment including failure to fully form a bolus with a thrusting machanism to transit poteriorally, leading to consistent premature spillage to pyriform sinuses. Pt has a posterior head tilt and even with total assist could barely bring head to neutral during study. This also contributes to direct spillage to liquids to the pyriform sinuses. If the bolus is large,  there is penetration and even aspiration with large thin boluses. Pt senses significant volume aspiration, but cough is not strong enough to expectorate. There is also mild to moderate pharyngeal residue due both to weak pharyngeal stripping as well as suspected dry pharyngeal mucosa. Recommend continuing current diet of dys 2 (observed to do well at meal, but would not masticate solids in the study) with nectar thick liquids via small cup sip or spoon only, no straws. Pt may benefit from pharyngeal strengthing, exercises to target cough strength and also improved neck ROM. Will f/u for interventions.   SLP Visit Diagnosis Dysphagia, oropharyngeal phase (R13.12) Attention and concentration deficit following -- Frontal lobe and executive function deficit following -- Impact on safety and function Moderate aspiration risk   CHL IP TREATMENT RECOMMENDATION 02/16/2020 Treatment Recommendations Therapy as outlined in treatment plan below   Prognosis 02/16/2020 Prognosis for Safe Diet Advancement Fair Barriers to Reach Goals -- Barriers/Prognosis Comment -- CHL IP DIET RECOMMENDATION 02/16/2020 SLP Diet Recommendations Dysphagia 2 (Fine chop) solids;Nectar thick liquid Liquid Administration via Cup;Spoon;No straw Medication Administration Whole meds with puree Compensations Slow rate;Small sips/bites;Clear throat intermittently Postural Changes Seated upright at 90 degrees   CHL IP OTHER RECOMMENDATIONS 02/16/2020 Recommended Consults -- Oral Care Recommendations Oral care BID Other Recommendations Have oral suction available   CHL IP FOLLOW UP RECOMMENDATIONS 02/16/2020 Follow up Recommendations Skilled Nursing facility   [facility] IP FREQUENCY AND DURATION 02/16/2020 Speech Therapy Frequency (ACUTE ONLY) min 2x/week Treatment Duration 2 weeks      CHL IP ORAL PHASE 02/16/2020 Oral Phase Impaired Oral - Pudding Teaspoon -- Oral - Pudding Cup -- Oral - Honey Teaspoon -- Oral - Honey Cup -- Oral - Nectar Teaspoon Premature spillage;Decreased bolus cohesion Oral - Nectar Cup Premature spillage;Decreased bolus cohesion Oral - Nectar Straw Premature spillage;Decreased bolus cohesion Oral - Thin Teaspoon Premature spillage;Decreased bolus cohesion Oral - Thin Cup Premature spillage;Decreased bolus cohesion Oral - Thin Straw NT Oral - Puree Decreased bolus cohesion Oral - Mech Soft -- Oral - Regular Other (Comment) Oral - Multi-Consistency -- Oral - Pill -- Oral Phase - Comment --  CHL IP PHARYNGEAL PHASE 02/16/2020 Pharyngeal Phase Impaired Pharyngeal- Pudding Teaspoon -- Pharyngeal -- Pharyngeal- Pudding Cup -- Pharyngeal -- Pharyngeal-  Honey Teaspoon -- Pharyngeal -- Pharyngeal- Honey Cup -- Pharyngeal -- Pharyngeal- Nectar Teaspoon Delayed swallow initiation-pyriform sinuses;Reduced pharyngeal peristalsis;Pharyngeal residue - valleculae;Pharyngeal residue - pyriform Pharyngeal -- Pharyngeal- Nectar Cup Delayed swallow initiation-pyriform sinuses;Reduced pharyngeal peristalsis;Pharyngeal residue - valleculae;Pharyngeal residue - pyriform;Penetration/Aspiration during swallow;Penetration/Apiration after swallow;Trace aspiration Pharyngeal Material enters airway, remains ABOVE vocal cords and not ejected out Pharyngeal- Nectar Straw Delayed swallow initiation-pyriform sinuses;Reduced pharyngeal peristalsis;Pharyngeal residue - valleculae;Pharyngeal residue - pyriform;Penetration/Aspiration during swallow;Penetration/Apiration after swallow;Trace aspiration Pharyngeal Material enters airway, passes BELOW cords without attempt by patient to eject out (silent aspiration) Pharyngeal- Thin Teaspoon Delayed swallow initiation-pyriform sinuses;Reduced pharyngeal peristalsis;Pharyngeal residue - valleculae;Pharyngeal residue - pyriform;Penetration/Aspiration during swallow;Penetration/Apiration after swallow;Trace aspiration Pharyngeal Material enters airway, passes BELOW cords without attempt by patient to eject out (silent aspiration) Pharyngeal- Thin Cup Delayed swallow initiation-pyriform sinuses;Reduced pharyngeal peristalsis;Pharyngeal residue - valleculae;Pharyngeal residue - pyriform;Penetration/Aspiration during swallow;Penetration/Apiration after swallow;Trace aspiration Pharyngeal Material enters airway, passes BELOW cords and not ejected out despite cough attempt by patient Pharyngeal- Thin Straw -- Pharyngeal -- Pharyngeal- Puree -- Pharyngeal -- Pharyngeal- Mechanical Soft -- Pharyngeal -- Pharyngeal- Regular -- Pharyngeal -- Pharyngeal- Multi-consistency -- Pharyngeal -- Pharyngeal- Pill -- Pharyngeal -- Pharyngeal Comment --  CHL IP CERVICAL  ESOPHAGEAL PHASE 02/16/2020 Cervical Esophageal  Phase WFL Pudding Teaspoon -- Pudding Cup -- Honey Teaspoon -- Honey Cup -- Nectar Teaspoon -- Nectar Cup -- Nectar Straw -- Thin Teaspoon -- Thin Cup -- Thin Straw -- Puree -- Mechanical Soft -- Regular -- Multi-consistency -- Pill -- Cervical Esophageal Comment -- DeBlois, Katherene Ponto 02/16/2020, 10:53 AM              VAS [place] LOWER EXTREMITY VENOUS (DVT) (ONLY [facility] & [facility])  Result Date: 02/09/2020  Lower Venous DVTStudy Indications: Pain.  Limitations: Body habitus and poor ultrasound/tissue interface. Comparison Study: No prior studies. Performing Technologist: Oliver Hum RVT  Examination Guidelines: A complete evaluation includes B-mode imaging, spectral Doppler, color Doppler, and power Doppler as needed of all accessible portions of each vessel. Bilateral testing is considered an integral part of a complete examination. Limited examinations for reoccurring indications may be performed as noted. The reflux portion of the exam is performed with the patient in reverse Trendelenburg.  +---------+---------------+---------+-----------+----------+--------------+ RIGHT    CompressibilityPhasicitySpontaneityPropertiesThrombus Aging +---------+---------------+---------+-----------+----------+--------------+ CFV      Full           Yes      Yes                                 +---------+---------------+---------+-----------+----------+--------------+ SFJ      Full                                                        +---------+---------------+---------+-----------+----------+--------------+ FV Prox  Full                                                        +---------+---------------+---------+-----------+----------+--------------+ FV Mid   Full                                                        +---------+---------------+---------+-----------+----------+--------------+ FV DistalFull                                                         +---------+---------------+---------+-----------+----------+--------------+ PFV      Full                                                        +---------+---------------+---------+-----------+----------+--------------+ POP      Full           Yes      Yes                                 +---------+---------------+---------+-----------+----------+--------------+ PTV      Full                                                        +---------+---------------+---------+-----------+----------+--------------+  PERO     Full                                                        +---------+---------------+---------+-----------+----------+--------------+   +---------+---------------+---------+-----------+----------+--------------+ LEFT     CompressibilityPhasicitySpontaneityPropertiesThrombus Aging +---------+---------------+---------+-----------+----------+--------------+ CFV      Full           Yes      Yes                                 +---------+---------------+---------+-----------+----------+--------------+ SFJ      Full                                                        +---------+---------------+---------+-----------+----------+--------------+ FV Prox  Full                                                        +---------+---------------+---------+-----------+----------+--------------+ FV Mid   Full                                                        +---------+---------------+---------+-----------+----------+--------------+ FV DistalFull                                                        +---------+---------------+---------+-----------+----------+--------------+ PFV      Full                                                        +---------+---------------+---------+-----------+----------+--------------+ POP      Partial        No       No                   Acute           +---------+---------------+---------+-----------+----------+--------------+ PTV      Full                                                        +---------+---------------+---------+-----------+----------+--------------+ PERO  Not visualized +---------+---------------+---------+-----------+----------+--------------+     Summary: RIGHT: - There is no evidence of deep vein thrombosis in the lower extremity. However, portions of this examination were limited- see technologist comments above.  - No cystic structure found in the popliteal fossa.  LEFT: - Findings consistent with acute deep vein thrombosis involving the left popliteal vein. - No cystic structure found in the popliteal fossa.  *See table(s) above for measurements and observations. Electronically signed by Deitra Mayo MD on 02/09/2020 at 3:11:43 PM.    Final     Microbiology: No results found for this or any previous visit (from the past 240 hour(s)).   Labs: Basic Metabolic Panel: Recent Labs  Lab 02/17/20 0358 02/18/20 0239 02/19/20 0238 02/20/20 0035 02/21/20 0234  NA 151* 150* 148* 141 140  K 4.5 4.7 3.7 3.1* 3.7  CL 117* 116* 114* 105 106  CO2 23 23 24 22 25   GLUCOSE 267* 343* 193* 170* 101*  BUN 68* 74* 73* 60* 43*  CREATININE 1.39* 1.54* 1.65* 1.29* 1.15  CALCIUM 9.7 9.5 9.1 8.8* 8.7*  MG 2.3 2.5* 2.4 2.1 2.1  PHOS 3.8 3.6 3.3 3.7 3.6   Liver Function Tests: Recent Labs  Lab 02/18/20 0239 02/19/20 0238 02/20/20 0035 02/21/20 0234  AST 37 55* 63* 59*  ALT 62* 70* 67* 59*  ALKPHOS 118 115 100 96  BILITOT 0.5 0.6 0.5 0.5  PROT 6.9 6.6 6.2* 5.6*  ALBUMIN 3.0* 3.0* 2.8* 2.7*   No results for input(s): LIPASE, AMYLASE in the last 168 hours. No results for input(s): AMMONIA in the last 168 hours. CBC: Recent Labs  Lab 02/17/20 0358 02/18/20 0239 02/19/20 0238 02/20/20 0035 02/21/20 0234  WBC 7.9 5.4 8.6 8.7 7.1  NEUTROABS 7.0 4.7 6.8 6.8  5.7  HGB 12.4* 12.1* 11.5* 10.7* 10.4*  HCT 39.9 39.8 37.4* 34.8* 33.1*  MCV 91.7 92.3 91.4 91.3 91.2  PLT 177 164 157 135* 112*   Cardiac Enzymes: No results for input(s): CKTOTAL, CKMB, CKMBINDEX, TROPONINI in the last 168 hours. BNP: BNP (last 3 results) Recent Labs    02/08/20 1545  BNP 30.2    ProBNP (last 3 results) No results for input(s): PROBNP in the last 8760 hours.  CBG: Recent Labs  Lab 02/16/20 0706 02/16/20 0724 02/16/20 1203 02/16/20 1520 02/16/20 2106  GLUCAP 267* 281* 367* 200* 323*       Signed:  Dia Crawford, MD [facility] [phone] pager

## 2020-02-21 NOTE — Discharge Instructions (Addendum)
Information on my medicine - ELIQUIS (apixaban)  This medication education was reviewed with me or my healthcare representative as part of my discharge preparation.  The pharmacist that spoke with me during my hospital stay was:   Why was Eliquis prescribed for you? Eliquis was prescribed to treat blood clots that may have been found in the veins of your legs (deep vein thrombosis) or in your lungs (pulmonary embolism) and to reduce the risk of them occurring again.  What do You need to know about Eliquis ? The dose is ONE 5 mg tablet taken TWICE daily.  Eliquis may be taken with or without food.   Try to take the dose about the same time in the morning and in the evening. If you have difficulty swallowing the tablet whole please discuss with your pharmacist how to take the medication safely.  Take Eliquis exactly as prescribed and DO NOT stop taking Eliquis without talking to the doctor who prescribed the medication.  Stopping may increase your risk of developing a new blood clot.  Refill your prescription before you run out.  After discharge, you should have regular check-up appointments with your healthcare provider that is prescribing your Eliquis.    What do you do if you miss a dose? If a dose of ELIQUIS is not taken at the scheduled time, take it as soon as possible on the same day and twice-daily administration should be resumed. The dose should not be doubled to make up for a missed dose.  Important Safety Information A possible side effect of Eliquis is bleeding. You should call your healthcare provider right away if you experience any of the following: Bleeding from an injury or your nose that does not stop. Unusual colored urine (red or dark brown) or unusual colored stools (red or black). Unusual bruising for unknown reasons. A serious fall or if you hit your head (even if there is no bleeding).  Some medicines may interact with Eliquis and might increase your risk  of bleeding or clotting while on Eliquis. To help avoid this, consult your healthcare provider or pharmacist prior to using any new prescription or non-prescription medications, including herbals, vitamins, non-steroidal anti-inflammatory drugs (NSAIDs) and supplements.  This website has more information on Eliquis (apixaban): http://www.eliquis.com/eliquis/home  

## 2020-02-22 ENCOUNTER — Telehealth: Payer: Self-pay | Admitting: Family Medicine

## 2020-02-22 DIAGNOSIS — J1282 Pneumonia due to coronavirus disease 2019: Secondary | ICD-10-CM | POA: Diagnosis not present

## 2020-02-22 DIAGNOSIS — I82402 Acute embolism and thrombosis of unspecified deep veins of left lower extremity: Secondary | ICD-10-CM | POA: Diagnosis not present

## 2020-02-22 DIAGNOSIS — K921 Melena: Secondary | ICD-10-CM | POA: Diagnosis not present

## 2020-02-22 DIAGNOSIS — U071 COVID-19: Secondary | ICD-10-CM | POA: Diagnosis not present

## 2020-02-22 NOTE — Telephone Encounter (Signed)
Spoke with Jimmy [name], Ed was moved moved to  [place] on Sunday and he is supposed to be there for 20 days  She asked to have you give her a call she has some questions   and she also asked if you would still be his PCP   I told her that you probably would unless it was   An issue with privileges or something like that     But even then you would still follow him

## 2020-02-23 ENCOUNTER — Ambulatory Visit: Payer: Medicare Other | Admitting: Family Medicine

## 2020-02-27 DIAGNOSIS — K921 Melena: Secondary | ICD-10-CM | POA: Diagnosis not present

## 2020-02-27 DIAGNOSIS — U071 COVID-19: Secondary | ICD-10-CM | POA: Diagnosis not present

## 2020-02-27 DIAGNOSIS — I82402 Acute embolism and thrombosis of unspecified deep veins of left lower extremity: Secondary | ICD-10-CM | POA: Diagnosis not present

## 2020-02-27 DIAGNOSIS — J1282 Pneumonia due to coronavirus disease 2019: Secondary | ICD-10-CM | POA: Diagnosis not present

## 2020-03-04 DIAGNOSIS — E119 Type 2 diabetes mellitus without complications: Secondary | ICD-10-CM | POA: Diagnosis not present

## 2020-03-04 DIAGNOSIS — I10 Essential (primary) hypertension: Secondary | ICD-10-CM | POA: Diagnosis not present

## 2020-03-04 DIAGNOSIS — R531 Weakness: Secondary | ICD-10-CM | POA: Diagnosis not present

## 2020-03-08 ENCOUNTER — Telehealth: Payer: Self-pay

## 2020-03-08 NOTE — Telephone Encounter (Signed)
Lets get a home health agency to go by and assess his needs

## 2020-03-08 NOTE — Telephone Encounter (Signed)
Pt. Daughter called wanting to know recommendations for PT for her dad she thinks that it is important for him to do PT once released from the rehab facility he is in since he was positive for covid. She said it was no rush in getting back to her she knows you are busy. Norvel Richards [phone].

## 2020-03-08 NOTE — Telephone Encounter (Signed)
Pt daughter will call Monday to advise If pt will be released from the office [employer]

## 2020-03-11 ENCOUNTER — Telehealth: Payer: Self-pay | Admitting: Family Medicine

## 2020-03-11 NOTE — Telephone Encounter (Signed)
Jimmy [name] came by and dropped off order report from Rehab. Sending back. I also made a copy to place with appt info.

## 2020-03-11 NOTE — Telephone Encounter (Signed)
Pt's wife [name] called. She wanted JCL to know pt is getting out of rehab Monday.

## 2020-03-14 DIAGNOSIS — C679 Malignant neoplasm of bladder, unspecified: Secondary | ICD-10-CM | POA: Diagnosis not present

## 2020-03-14 DIAGNOSIS — I82402 Acute embolism and thrombosis of unspecified deep veins of left lower extremity: Secondary | ICD-10-CM | POA: Diagnosis not present

## 2020-03-14 DIAGNOSIS — J1282 Pneumonia due to coronavirus disease 2019: Secondary | ICD-10-CM | POA: Diagnosis not present

## 2020-03-14 DIAGNOSIS — U071 COVID-19: Secondary | ICD-10-CM | POA: Diagnosis not present

## 2020-03-14 NOTE — Telephone Encounter (Signed)
Pt has appt 03-17-20. [place]

## 2020-03-14 NOTE — Telephone Encounter (Signed)
Pt wife was advised [facility] 

## 2020-03-14 NOTE — Telephone Encounter (Signed)
Just reassure her.

## 2020-03-14 NOTE — Telephone Encounter (Signed)
Pt's wife called and is unsure of what medications to give him. Pt has an appt on Thursday. Copy of perders sent back in folder Friday. Please advise [name] at [phone].

## 2020-03-16 ENCOUNTER — Telehealth: Payer: Self-pay

## 2020-03-16 NOTE — Telephone Encounter (Signed)
Ptwife wanted talk about insulin scale . Pt has appt. 03-17-20. Kh

## 2020-03-17 ENCOUNTER — Ambulatory Visit (INDEPENDENT_AMBULATORY_CARE_PROVIDER_SITE_OTHER): Payer: Medicare PPO | Admitting: Family Medicine

## 2020-03-17 ENCOUNTER — Encounter: Payer: Self-pay | Admitting: Family Medicine

## 2020-03-17 ENCOUNTER — Other Ambulatory Visit: Payer: Self-pay

## 2020-03-17 ENCOUNTER — Telehealth: Payer: Self-pay

## 2020-03-17 VITALS — BP 92/62 | HR 80 | Temp 96.8°F | Wt 202.0 lb

## 2020-03-17 DIAGNOSIS — U071 COVID-19: Secondary | ICD-10-CM

## 2020-03-17 DIAGNOSIS — D692 Other nonthrombocytopenic purpura: Secondary | ICD-10-CM | POA: Diagnosis not present

## 2020-03-17 DIAGNOSIS — E1169 Type 2 diabetes mellitus with other specified complication: Secondary | ICD-10-CM

## 2020-03-17 DIAGNOSIS — J1282 Pneumonia due to coronavirus disease 2019: Secondary | ICD-10-CM

## 2020-03-17 DIAGNOSIS — E118 Type 2 diabetes mellitus with unspecified complications: Secondary | ICD-10-CM

## 2020-03-17 DIAGNOSIS — I10 Essential (primary) hypertension: Secondary | ICD-10-CM

## 2020-03-17 DIAGNOSIS — E1159 Type 2 diabetes mellitus with other circulatory complications: Secondary | ICD-10-CM

## 2020-03-17 DIAGNOSIS — J449 Chronic obstructive pulmonary disease, unspecified: Secondary | ICD-10-CM

## 2020-03-17 DIAGNOSIS — J4489 Other specified chronic obstructive pulmonary disease: Secondary | ICD-10-CM

## 2020-03-17 DIAGNOSIS — J4520 Mild intermittent asthma, uncomplicated: Secondary | ICD-10-CM

## 2020-03-17 DIAGNOSIS — E785 Hyperlipidemia, unspecified: Secondary | ICD-10-CM

## 2020-03-17 DIAGNOSIS — I152 Hypertension secondary to endocrine disorders: Secondary | ICD-10-CM

## 2020-03-17 DIAGNOSIS — C679 Malignant neoplasm of bladder, unspecified: Secondary | ICD-10-CM | POA: Diagnosis not present

## 2020-03-17 NOTE — Telephone Encounter (Signed)
Pt wife called asking about resuming metoprolol BID and pantoprazole BID. Per Dr. Redmond School he says yes and he advised that he is to take pantoprazole once a day.  [place]

## 2020-03-17 NOTE — Progress Notes (Signed)
   Subjective:    Patient ID: Jimmy [name], male    DOB: 17-Jan-1946, 74 y.o.   MRN: [id number]  HPI He is here for follow-up visit after recent hospitalizations.  He was admitted December 23 and sent home on the first.  He did have surgical procedure for his underlying bladder cancer and now has a bladder pouch.  He was readmitted on February 8 for COVID-19 positive test  and hypoxia.  He was discharged on February 22 and sent to [facility].  He was discharged there on March 15 and is now at home.  He continues on oxygen and has been moved dropped down to 2 L/day.  Apparently he desaturates down to 85 with any kind of physical activity.  He has a follow-up with urology next Tuesday.  He continues on MiraLAX to help with his bowel habits.  His blood sugars have been usually running below 147.  Presently he is on Levemir 16 units twice daily and no sliding scale has been needed.  He continues on Combivent and has been using albuterol regularly.  He does have underlying hyperlipidemia as well as hypertension.  He is starting to become more physically active at home.   Review of Systems     Objective:   Physical Exam Alert and in no distress.  He is quite pale appearing.  Pulse ox on room air was 91-92.  Cardiac exam does show a sinus rhythm.  Lungs show decreased breath sounds.  Forearms do show purpuric lesions.       Assessment & Plan:  Pneumonia due to COVID-19 virus - Plan: CBC with Differential/Platelet, Comprehensive metabolic panel, Magnesium  Type II diabetes mellitus with complication (HCC)  Malignant neoplasm of urinary bladder, unspecified site ([place]) - Plan: CBC with Differential/Platelet, Comprehensive metabolic panel, Magnesium  Morbid obesity ([place])  Hypertension associated with type 2 diabetes mellitus ([place]) - Plan: CBC with Differential/Platelet, Comprehensive metabolic panel  Mild intermittent asthma with irreversible airway obstruction without complication ([place]),  Chronic  Senile purpura ([place]), Chronic  Hyperlipidemia associated with type 2 diabetes mellitus ([place]) Keep the pulse ox above 92.  Change the oxygen to 1 L per and you can go higher if he goes to the bathroom to keep it above 92 continue on the Combivent and use the albuterol as needed Stay on Eliquis.  Be as active as possible and when sitting keep your legs elevated and use support stockings stop the nystatin.  Stay on a good multivitamin.  You can stop vitamin C, zinc thiamine and magnesium Start back on Metformin and continue to monitor blood sugars when the morning blood sugars start to go below 120 call me and then we will start cutting back on the Levemir.  Stop the sliding scale Recheck here virtually in 1 month.  Approximately 50 minutes spent discussing all these issues with him and his wife. We will discuss blood pressure and statin use at a later date.

## 2020-03-17 NOTE — Patient Instructions (Addendum)
Keep the pulse ox above 92.  Change the oxygen to 1 L per and you can go higher if he goes to the bathroom to keep it above 92 continue on the Combivent and use the albuterol as needed Stay on Eliquis.  Be as active as possible and when sitting keep your legs elevated and use support stockings stop the nystatin.  Stay on a good multivitamin.  You can stop vitamin C, zinc thiamine and magnesium Start back on Metformin and continue to monitor blood sugars when the morning blood sugars start to go below 120 call me and then we will start cutting back on the Levemir.  Stop the sliding scale

## 2020-03-18 DIAGNOSIS — E1122 Type 2 diabetes mellitus with diabetic chronic kidney disease: Secondary | ICD-10-CM | POA: Diagnosis not present

## 2020-03-18 DIAGNOSIS — J209 Acute bronchitis, unspecified: Secondary | ICD-10-CM | POA: Diagnosis not present

## 2020-03-18 DIAGNOSIS — U071 COVID-19: Secondary | ICD-10-CM | POA: Diagnosis not present

## 2020-03-18 DIAGNOSIS — E1136 Type 2 diabetes mellitus with diabetic cataract: Secondary | ICD-10-CM | POA: Diagnosis not present

## 2020-03-18 DIAGNOSIS — I129 Hypertensive chronic kidney disease with stage 1 through stage 4 chronic kidney disease, or unspecified chronic kidney disease: Secondary | ICD-10-CM | POA: Diagnosis not present

## 2020-03-18 DIAGNOSIS — N1831 Chronic kidney disease, stage 3a: Secondary | ICD-10-CM | POA: Diagnosis not present

## 2020-03-18 DIAGNOSIS — I2510 Atherosclerotic heart disease of native coronary artery without angina pectoris: Secondary | ICD-10-CM | POA: Diagnosis not present

## 2020-03-18 DIAGNOSIS — J9691 Respiratory failure, unspecified with hypoxia: Secondary | ICD-10-CM | POA: Diagnosis not present

## 2020-03-18 DIAGNOSIS — J1282 Pneumonia due to coronavirus disease 2019: Secondary | ICD-10-CM | POA: Diagnosis not present

## 2020-03-18 LAB — COMPREHENSIVE METABOLIC PANEL
ALT: 21 IU/L (ref 0–44)
AST: 28 IU/L (ref 0–40)
Albumin/Globulin Ratio: 1.1 — ABNORMAL LOW (ref 1.2–2.2)
Albumin: 3.2 g/dL — ABNORMAL LOW (ref 3.7–4.7)
Alkaline Phosphatase: 163 IU/L — ABNORMAL HIGH (ref 39–117)
BUN/Creatinine Ratio: 13 (ref 10–24)
BUN: 13 mg/dL (ref 8–27)
Bilirubin Total: <0.2 mg/dL (ref 0.0–1.2)
CO2: 21 mmol/L (ref 20–29)
Calcium: 9.4 mg/dL (ref 8.6–10.2)
Chloride: 108 mmol/L — ABNORMAL HIGH (ref 96–106)
Creatinine, Ser: 1 mg/dL (ref 0.76–1.27)
GFR calc Af Amer: 86 mL/min/{1.73_m2} (ref >59)
GFR calc non Af Amer: 74 mL/min/{1.73_m2} (ref >59)
Globulin, Total: 3 g/dL (ref 1.5–4.5)
Glucose: 118 mg/dL — ABNORMAL HIGH (ref 65–99)
Potassium: 4.8 mmol/L (ref 3.5–5.2)
Sodium: 143 mmol/L (ref 134–144)
Total Protein: 6.2 g/dL (ref 6.0–8.5)

## 2020-03-18 LAB — MAGNESIUM: Magnesium: 1.6 mg/dL (ref 1.6–2.3)

## 2020-03-18 LAB — CBC WITH DIFFERENTIAL/PLATELET
Basophils Absolute: 0.1 10*3/uL (ref 0.0–0.2)
Basos: 1 %
EOS (ABSOLUTE): 0.2 10*3/uL (ref 0.0–0.4)
Eos: 3 %
Hematocrit: 35.4 % — ABNORMAL LOW (ref 37.5–51.0)
Hemoglobin: 11.4 g/dL — ABNORMAL LOW (ref 13.0–17.7)
Immature Grans (Abs): 0.1 10*3/uL (ref 0.0–0.1)
Immature Granulocytes: 1 %
Lymphocytes Absolute: 0.8 10*3/uL (ref 0.7–3.1)
Lymphs: 11 %
MCH: 28.3 pg (ref 26.6–33.0)
MCHC: 32.2 g/dL (ref 31.5–35.7)
MCV: 88 fL (ref 79–97)
Monocytes Absolute: 0.5 10*3/uL (ref 0.1–0.9)
Monocytes: 8 %
Neutrophils Absolute: 5.1 10*3/uL (ref 1.4–7.0)
Neutrophils: 76 %
Platelets: 225 10*3/uL (ref 150–450)
RBC: 4.03 x10E6/uL — ABNORMAL LOW (ref 4.14–5.80)
RDW: 16.6 % — ABNORMAL HIGH (ref 11.6–15.4)
WBC: 6.7 10*3/uL (ref 3.4–10.8)

## 2020-03-21 ENCOUNTER — Telehealth: Payer: Self-pay | Admitting: Family Medicine

## 2020-03-21 NOTE — Telephone Encounter (Signed)
They changed to [facility] and his current meter and test strips are not covered  He needs rx for meter, test strips and lancets  ( they do 90 day supplies)  Looks like Accu check is covered ? ( unless you know other information)  [facility]

## 2020-03-22 DIAGNOSIS — C678 Malignant neoplasm of overlapping sites of bladder: Secondary | ICD-10-CM | POA: Diagnosis not present

## 2020-03-22 DIAGNOSIS — Z8546 Personal history of malignant neoplasm of prostate: Secondary | ICD-10-CM | POA: Diagnosis not present

## 2020-03-23 ENCOUNTER — Other Ambulatory Visit: Payer: Self-pay

## 2020-03-23 DIAGNOSIS — J9691 Respiratory failure, unspecified with hypoxia: Secondary | ICD-10-CM | POA: Diagnosis not present

## 2020-03-23 DIAGNOSIS — E1136 Type 2 diabetes mellitus with diabetic cataract: Secondary | ICD-10-CM | POA: Diagnosis not present

## 2020-03-23 DIAGNOSIS — J209 Acute bronchitis, unspecified: Secondary | ICD-10-CM | POA: Diagnosis not present

## 2020-03-23 DIAGNOSIS — U071 COVID-19: Secondary | ICD-10-CM | POA: Diagnosis not present

## 2020-03-23 DIAGNOSIS — N1831 Chronic kidney disease, stage 3a: Secondary | ICD-10-CM | POA: Diagnosis not present

## 2020-03-23 DIAGNOSIS — I2510 Atherosclerotic heart disease of native coronary artery without angina pectoris: Secondary | ICD-10-CM | POA: Diagnosis not present

## 2020-03-23 DIAGNOSIS — J1282 Pneumonia due to coronavirus disease 2019: Secondary | ICD-10-CM | POA: Diagnosis not present

## 2020-03-23 DIAGNOSIS — E1122 Type 2 diabetes mellitus with diabetic chronic kidney disease: Secondary | ICD-10-CM | POA: Diagnosis not present

## 2020-03-23 DIAGNOSIS — I129 Hypertensive chronic kidney disease with stage 1 through stage 4 chronic kidney disease, or unspecified chronic kidney disease: Secondary | ICD-10-CM | POA: Diagnosis not present

## 2020-03-24 ENCOUNTER — Other Ambulatory Visit: Payer: Self-pay

## 2020-03-24 DIAGNOSIS — I129 Hypertensive chronic kidney disease with stage 1 through stage 4 chronic kidney disease, or unspecified chronic kidney disease: Secondary | ICD-10-CM | POA: Diagnosis not present

## 2020-03-24 DIAGNOSIS — J209 Acute bronchitis, unspecified: Secondary | ICD-10-CM | POA: Diagnosis not present

## 2020-03-24 DIAGNOSIS — E1136 Type 2 diabetes mellitus with diabetic cataract: Secondary | ICD-10-CM | POA: Diagnosis not present

## 2020-03-24 DIAGNOSIS — E1122 Type 2 diabetes mellitus with diabetic chronic kidney disease: Secondary | ICD-10-CM | POA: Diagnosis not present

## 2020-03-24 DIAGNOSIS — I2510 Atherosclerotic heart disease of native coronary artery without angina pectoris: Secondary | ICD-10-CM | POA: Diagnosis not present

## 2020-03-24 DIAGNOSIS — U071 COVID-19: Secondary | ICD-10-CM | POA: Diagnosis not present

## 2020-03-24 DIAGNOSIS — J1282 Pneumonia due to coronavirus disease 2019: Secondary | ICD-10-CM | POA: Diagnosis not present

## 2020-03-24 DIAGNOSIS — N1831 Chronic kidney disease, stage 3a: Secondary | ICD-10-CM | POA: Diagnosis not present

## 2020-03-24 DIAGNOSIS — J9691 Respiratory failure, unspecified with hypoxia: Secondary | ICD-10-CM | POA: Diagnosis not present

## 2020-03-24 MED ORDER — ACCU-CHEK SOFTCLIX LANCETS MISC: 1.0000 | Freq: Three times a day (TID) | 2 refills | Status: AC

## 2020-03-24 MED ORDER — GLUCOSE BLOOD VI STRP: 1.0000 | ORAL_STRIP | Freq: Three times a day (TID) | 2 refills | Status: AC

## 2020-03-24 NOTE — Telephone Encounter (Signed)
Pt wife was called concerning pt testing supplies. She wanted you to know the pt is doing better with his O2. Pt iis on and off and doing well . One hour off sat is 90-92 and two hours off sat was 89 resting. [place]

## 2020-03-25 DIAGNOSIS — I129 Hypertensive chronic kidney disease with stage 1 through stage 4 chronic kidney disease, or unspecified chronic kidney disease: Secondary | ICD-10-CM | POA: Diagnosis not present

## 2020-03-25 DIAGNOSIS — J1282 Pneumonia due to coronavirus disease 2019: Secondary | ICD-10-CM | POA: Diagnosis not present

## 2020-03-25 DIAGNOSIS — I2510 Atherosclerotic heart disease of native coronary artery without angina pectoris: Secondary | ICD-10-CM | POA: Diagnosis not present

## 2020-03-25 DIAGNOSIS — E1122 Type 2 diabetes mellitus with diabetic chronic kidney disease: Secondary | ICD-10-CM | POA: Diagnosis not present

## 2020-03-25 DIAGNOSIS — E1136 Type 2 diabetes mellitus with diabetic cataract: Secondary | ICD-10-CM | POA: Diagnosis not present

## 2020-03-25 DIAGNOSIS — J209 Acute bronchitis, unspecified: Secondary | ICD-10-CM | POA: Diagnosis not present

## 2020-03-25 DIAGNOSIS — J9691 Respiratory failure, unspecified with hypoxia: Secondary | ICD-10-CM | POA: Diagnosis not present

## 2020-03-25 DIAGNOSIS — N1831 Chronic kidney disease, stage 3a: Secondary | ICD-10-CM | POA: Diagnosis not present

## 2020-03-25 DIAGNOSIS — U071 COVID-19: Secondary | ICD-10-CM | POA: Diagnosis not present

## 2020-03-28 ENCOUNTER — Other Ambulatory Visit: Payer: Self-pay

## 2020-03-28 DIAGNOSIS — I129 Hypertensive chronic kidney disease with stage 1 through stage 4 chronic kidney disease, or unspecified chronic kidney disease: Secondary | ICD-10-CM | POA: Diagnosis not present

## 2020-03-28 DIAGNOSIS — E1136 Type 2 diabetes mellitus with diabetic cataract: Secondary | ICD-10-CM | POA: Diagnosis not present

## 2020-03-28 DIAGNOSIS — I2510 Atherosclerotic heart disease of native coronary artery without angina pectoris: Secondary | ICD-10-CM | POA: Diagnosis not present

## 2020-03-28 DIAGNOSIS — E1122 Type 2 diabetes mellitus with diabetic chronic kidney disease: Secondary | ICD-10-CM | POA: Diagnosis not present

## 2020-03-28 DIAGNOSIS — J1282 Pneumonia due to coronavirus disease 2019: Secondary | ICD-10-CM | POA: Diagnosis not present

## 2020-03-28 DIAGNOSIS — J9691 Respiratory failure, unspecified with hypoxia: Secondary | ICD-10-CM | POA: Diagnosis not present

## 2020-03-28 DIAGNOSIS — U071 COVID-19: Secondary | ICD-10-CM | POA: Diagnosis not present

## 2020-03-28 DIAGNOSIS — N1831 Chronic kidney disease, stage 3a: Secondary | ICD-10-CM | POA: Diagnosis not present

## 2020-03-28 DIAGNOSIS — J209 Acute bronchitis, unspecified: Secondary | ICD-10-CM | POA: Diagnosis not present

## 2020-03-30 DIAGNOSIS — J1282 Pneumonia due to coronavirus disease 2019: Secondary | ICD-10-CM | POA: Diagnosis not present

## 2020-03-30 DIAGNOSIS — N1831 Chronic kidney disease, stage 3a: Secondary | ICD-10-CM | POA: Diagnosis not present

## 2020-03-30 DIAGNOSIS — I2510 Atherosclerotic heart disease of native coronary artery without angina pectoris: Secondary | ICD-10-CM | POA: Diagnosis not present

## 2020-03-30 DIAGNOSIS — I129 Hypertensive chronic kidney disease with stage 1 through stage 4 chronic kidney disease, or unspecified chronic kidney disease: Secondary | ICD-10-CM | POA: Diagnosis not present

## 2020-03-30 DIAGNOSIS — J209 Acute bronchitis, unspecified: Secondary | ICD-10-CM | POA: Diagnosis not present

## 2020-03-30 DIAGNOSIS — E1122 Type 2 diabetes mellitus with diabetic chronic kidney disease: Secondary | ICD-10-CM | POA: Diagnosis not present

## 2020-03-30 DIAGNOSIS — U071 COVID-19: Secondary | ICD-10-CM | POA: Diagnosis not present

## 2020-03-30 DIAGNOSIS — E1136 Type 2 diabetes mellitus with diabetic cataract: Secondary | ICD-10-CM | POA: Diagnosis not present

## 2020-03-30 DIAGNOSIS — J9691 Respiratory failure, unspecified with hypoxia: Secondary | ICD-10-CM | POA: Diagnosis not present

## 2020-04-01 DIAGNOSIS — U071 COVID-19: Secondary | ICD-10-CM | POA: Diagnosis not present

## 2020-04-01 DIAGNOSIS — J1282 Pneumonia due to coronavirus disease 2019: Secondary | ICD-10-CM | POA: Diagnosis not present

## 2020-04-01 DIAGNOSIS — I2510 Atherosclerotic heart disease of native coronary artery without angina pectoris: Secondary | ICD-10-CM | POA: Diagnosis not present

## 2020-04-01 DIAGNOSIS — E1122 Type 2 diabetes mellitus with diabetic chronic kidney disease: Secondary | ICD-10-CM | POA: Diagnosis not present

## 2020-04-01 DIAGNOSIS — E1136 Type 2 diabetes mellitus with diabetic cataract: Secondary | ICD-10-CM | POA: Diagnosis not present

## 2020-04-01 DIAGNOSIS — J9691 Respiratory failure, unspecified with hypoxia: Secondary | ICD-10-CM | POA: Diagnosis not present

## 2020-04-01 DIAGNOSIS — I129 Hypertensive chronic kidney disease with stage 1 through stage 4 chronic kidney disease, or unspecified chronic kidney disease: Secondary | ICD-10-CM | POA: Diagnosis not present

## 2020-04-01 DIAGNOSIS — J209 Acute bronchitis, unspecified: Secondary | ICD-10-CM | POA: Diagnosis not present

## 2020-04-01 DIAGNOSIS — N1831 Chronic kidney disease, stage 3a: Secondary | ICD-10-CM | POA: Diagnosis not present

## 2020-04-04 DIAGNOSIS — R310 Gross hematuria: Secondary | ICD-10-CM | POA: Diagnosis not present

## 2020-04-04 DIAGNOSIS — N200 Calculus of kidney: Secondary | ICD-10-CM | POA: Diagnosis not present

## 2020-04-04 DIAGNOSIS — C678 Malignant neoplasm of overlapping sites of bladder: Secondary | ICD-10-CM | POA: Diagnosis not present

## 2020-04-05 ENCOUNTER — Telehealth: Payer: Self-pay | Admitting: Family Medicine

## 2020-04-05 ENCOUNTER — Encounter: Payer: Self-pay | Admitting: *Deleted

## 2020-04-05 NOTE — Telephone Encounter (Signed)
Jimmy [name] called and states that pt is bleeding (A LOT) thru his colostomy. Pt saw surgeon yest and a CT was done, results are not back as of yet. Surg advised to contact PCP concerning Eliguis. Jimmy [name] would like to know if [name] needs to stay on. Please advise pt [phone].

## 2020-04-05 NOTE — Telephone Encounter (Signed)
Check with him to see if he is bleeding anywhere else like nasal gums excessive bruising and if he is not showing any other evidence of bleeding then he needs to see the surgeon to follow-up on the bleeding

## 2020-04-05 NOTE — Telephone Encounter (Signed)
Little blood when he blows his nose but thinks it may come from the 02, no bleeding from any where else. Pt has a appointment tomorrow with surgeon. Pt wife was advise you requesting to stay on the eliquis.

## 2020-04-06 DIAGNOSIS — Z8551 Personal history of malignant neoplasm of bladder: Secondary | ICD-10-CM | POA: Diagnosis not present

## 2020-04-06 DIAGNOSIS — Z936 Other artificial openings of urinary tract status: Secondary | ICD-10-CM | POA: Diagnosis not present

## 2020-04-06 DIAGNOSIS — R310 Gross hematuria: Secondary | ICD-10-CM | POA: Diagnosis not present

## 2020-04-06 DIAGNOSIS — C678 Malignant neoplasm of overlapping sites of bladder: Secondary | ICD-10-CM | POA: Diagnosis not present

## 2020-04-07 ENCOUNTER — Encounter: Payer: Self-pay | Admitting: Family Medicine

## 2020-04-11 ENCOUNTER — Ambulatory Visit (INDEPENDENT_AMBULATORY_CARE_PROVIDER_SITE_OTHER): Payer: Medicare PPO | Admitting: Family Medicine

## 2020-04-11 ENCOUNTER — Encounter: Payer: Self-pay | Admitting: Family Medicine

## 2020-04-11 VITALS — BP 118/68 | Temp 97.0°F | Wt 202.0 lb

## 2020-04-11 DIAGNOSIS — J9691 Respiratory failure, unspecified with hypoxia: Secondary | ICD-10-CM | POA: Diagnosis not present

## 2020-04-11 DIAGNOSIS — Z8546 Personal history of malignant neoplasm of prostate: Secondary | ICD-10-CM | POA: Diagnosis not present

## 2020-04-11 DIAGNOSIS — N1831 Chronic kidney disease, stage 3a: Secondary | ICD-10-CM | POA: Diagnosis not present

## 2020-04-11 DIAGNOSIS — C679 Malignant neoplasm of bladder, unspecified: Secondary | ICD-10-CM

## 2020-04-11 DIAGNOSIS — E1136 Type 2 diabetes mellitus with diabetic cataract: Secondary | ICD-10-CM | POA: Diagnosis not present

## 2020-04-11 DIAGNOSIS — I2510 Atherosclerotic heart disease of native coronary artery without angina pectoris: Secondary | ICD-10-CM | POA: Diagnosis not present

## 2020-04-11 DIAGNOSIS — J129 Viral pneumonia, unspecified: Secondary | ICD-10-CM

## 2020-04-11 DIAGNOSIS — J4489 Other specified chronic obstructive pulmonary disease: Secondary | ICD-10-CM

## 2020-04-11 DIAGNOSIS — U071 COVID-19: Secondary | ICD-10-CM

## 2020-04-11 DIAGNOSIS — I129 Hypertensive chronic kidney disease with stage 1 through stage 4 chronic kidney disease, or unspecified chronic kidney disease: Secondary | ICD-10-CM | POA: Diagnosis not present

## 2020-04-11 DIAGNOSIS — J449 Chronic obstructive pulmonary disease, unspecified: Secondary | ICD-10-CM | POA: Diagnosis not present

## 2020-04-11 DIAGNOSIS — J209 Acute bronchitis, unspecified: Secondary | ICD-10-CM | POA: Diagnosis not present

## 2020-04-11 DIAGNOSIS — J1282 Pneumonia due to coronavirus disease 2019: Secondary | ICD-10-CM | POA: Diagnosis not present

## 2020-04-11 DIAGNOSIS — E1122 Type 2 diabetes mellitus with diabetic chronic kidney disease: Secondary | ICD-10-CM | POA: Diagnosis not present

## 2020-04-11 MED ORDER — APIXABAN 5 MG PO TABS: 5.0000 mg | ORAL_TABLET | Freq: Two times a day (BID) | ORAL | 1 refills | Status: DC

## 2020-04-11 NOTE — Progress Notes (Signed)
   Subjective:    Patient ID: Jimmy [name], male    DOB: 1946/03/16, 74 y.o.   MRN: [id number]  HPI Today's visit is for follow-up on his oxygen use.  He was recently in the hospital for pneumonia and is still on 2 L/min.  His pulse ox is 93 but with any physical activity it drops into the 80s.  He has been stable on this amount of oxygen for the last 30 days while at home.  He does have portable oxygen however it is a tank and a portable oxygen concentrator would be much more useful. He also needs a refill on his Eliquis   Review of Systems     Objective:   Physical Exam        Assessment & Plan:

## 2020-04-12 ENCOUNTER — Telehealth: Payer: Self-pay | Admitting: Family Medicine

## 2020-04-12 ENCOUNTER — Telehealth: Payer: Self-pay | Admitting: Internal Medicine

## 2020-04-12 ENCOUNTER — Encounter: Payer: Self-pay | Admitting: Family Medicine

## 2020-04-12 MED ORDER — APIXABAN 5 MG PO TABS: 5.0000 mg | ORAL_TABLET | Freq: Two times a day (BID) | ORAL | 1 refills | Status: DC

## 2020-04-12 NOTE — Telephone Encounter (Signed)
ok 

## 2020-04-12 NOTE — Telephone Encounter (Signed)
Kendra from [facility] called wanting verbal order to extend PT for 2x for 3 weeks and then 1x for 1 week.   [phone]

## 2020-04-12 NOTE — Telephone Encounter (Signed)
Eliquis rx supposed to be for 90 day supply  [facility]

## 2020-04-12 NOTE — Telephone Encounter (Signed)
Eliquis rx supposed to be 90 day supply can [facility]

## 2020-04-13 DIAGNOSIS — J9691 Respiratory failure, unspecified with hypoxia: Secondary | ICD-10-CM | POA: Diagnosis not present

## 2020-04-13 DIAGNOSIS — J209 Acute bronchitis, unspecified: Secondary | ICD-10-CM | POA: Diagnosis not present

## 2020-04-13 DIAGNOSIS — E1122 Type 2 diabetes mellitus with diabetic chronic kidney disease: Secondary | ICD-10-CM | POA: Diagnosis not present

## 2020-04-13 DIAGNOSIS — I2510 Atherosclerotic heart disease of native coronary artery without angina pectoris: Secondary | ICD-10-CM | POA: Diagnosis not present

## 2020-04-13 DIAGNOSIS — U071 COVID-19: Secondary | ICD-10-CM | POA: Diagnosis not present

## 2020-04-13 DIAGNOSIS — J1282 Pneumonia due to coronavirus disease 2019: Secondary | ICD-10-CM | POA: Diagnosis not present

## 2020-04-13 DIAGNOSIS — N1831 Chronic kidney disease, stage 3a: Secondary | ICD-10-CM | POA: Diagnosis not present

## 2020-04-13 DIAGNOSIS — E1136 Type 2 diabetes mellitus with diabetic cataract: Secondary | ICD-10-CM | POA: Diagnosis not present

## 2020-04-13 DIAGNOSIS — I129 Hypertensive chronic kidney disease with stage 1 through stage 4 chronic kidney disease, or unspecified chronic kidney disease: Secondary | ICD-10-CM | POA: Diagnosis not present

## 2020-04-13 NOTE — Telephone Encounter (Signed)
Verbal was given to well to continue PT. [facility]

## 2020-04-14 DIAGNOSIS — U071 COVID-19: Secondary | ICD-10-CM | POA: Diagnosis not present

## 2020-04-15 ENCOUNTER — Other Ambulatory Visit: Payer: Self-pay | Admitting: Family Medicine

## 2020-04-15 DIAGNOSIS — I129 Hypertensive chronic kidney disease with stage 1 through stage 4 chronic kidney disease, or unspecified chronic kidney disease: Secondary | ICD-10-CM | POA: Diagnosis not present

## 2020-04-15 DIAGNOSIS — U071 COVID-19: Secondary | ICD-10-CM | POA: Diagnosis not present

## 2020-04-15 DIAGNOSIS — J1282 Pneumonia due to coronavirus disease 2019: Secondary | ICD-10-CM | POA: Diagnosis not present

## 2020-04-15 DIAGNOSIS — N1831 Chronic kidney disease, stage 3a: Secondary | ICD-10-CM | POA: Diagnosis not present

## 2020-04-15 DIAGNOSIS — I2510 Atherosclerotic heart disease of native coronary artery without angina pectoris: Secondary | ICD-10-CM | POA: Diagnosis not present

## 2020-04-15 DIAGNOSIS — E1136 Type 2 diabetes mellitus with diabetic cataract: Secondary | ICD-10-CM | POA: Diagnosis not present

## 2020-04-15 DIAGNOSIS — J209 Acute bronchitis, unspecified: Secondary | ICD-10-CM | POA: Diagnosis not present

## 2020-04-15 DIAGNOSIS — J9691 Respiratory failure, unspecified with hypoxia: Secondary | ICD-10-CM | POA: Diagnosis not present

## 2020-04-15 DIAGNOSIS — E1122 Type 2 diabetes mellitus with diabetic chronic kidney disease: Secondary | ICD-10-CM | POA: Diagnosis not present

## 2020-04-18 ENCOUNTER — Ambulatory Visit: Payer: Medicare PPO | Admitting: Family Medicine

## 2020-04-18 ENCOUNTER — Telehealth: Payer: Self-pay | Admitting: Family Medicine

## 2020-04-18 NOTE — Telephone Encounter (Signed)
  Jimmy [name] wants to make sure it is ok for Jimmy [name] to get 1st dose covid vaccines next week [employer])  He was diagnosed with covid 02/08/2020 Some  have said you have to wait 90 days and some same you don't, so she wants to check with you first

## 2020-04-19 DIAGNOSIS — I2510 Atherosclerotic heart disease of native coronary artery without angina pectoris: Secondary | ICD-10-CM | POA: Diagnosis not present

## 2020-04-19 DIAGNOSIS — J209 Acute bronchitis, unspecified: Secondary | ICD-10-CM | POA: Diagnosis not present

## 2020-04-19 DIAGNOSIS — U071 COVID-19: Secondary | ICD-10-CM | POA: Diagnosis not present

## 2020-04-19 DIAGNOSIS — J1282 Pneumonia due to coronavirus disease 2019: Secondary | ICD-10-CM | POA: Diagnosis not present

## 2020-04-19 DIAGNOSIS — E1136 Type 2 diabetes mellitus with diabetic cataract: Secondary | ICD-10-CM | POA: Diagnosis not present

## 2020-04-19 DIAGNOSIS — J9691 Respiratory failure, unspecified with hypoxia: Secondary | ICD-10-CM | POA: Diagnosis not present

## 2020-04-19 DIAGNOSIS — E1122 Type 2 diabetes mellitus with diabetic chronic kidney disease: Secondary | ICD-10-CM | POA: Diagnosis not present

## 2020-04-19 DIAGNOSIS — N1831 Chronic kidney disease, stage 3a: Secondary | ICD-10-CM | POA: Diagnosis not present

## 2020-04-19 DIAGNOSIS — I129 Hypertensive chronic kidney disease with stage 1 through stage 4 chronic kidney disease, or unspecified chronic kidney disease: Secondary | ICD-10-CM | POA: Diagnosis not present

## 2020-04-19 NOTE — Telephone Encounter (Signed)
  [name] informed of Dr. Lanice Shirts recommendations on the covid vaccine

## 2020-04-19 NOTE — Telephone Encounter (Signed)
I think it would be safe to wait the 90 days.  I would like to see him in better shape before giving him something that might make him feel sick for a couple of days

## 2020-04-20 DIAGNOSIS — E1122 Type 2 diabetes mellitus with diabetic chronic kidney disease: Secondary | ICD-10-CM | POA: Diagnosis not present

## 2020-04-20 DIAGNOSIS — E1136 Type 2 diabetes mellitus with diabetic cataract: Secondary | ICD-10-CM | POA: Diagnosis not present

## 2020-04-20 DIAGNOSIS — I2510 Atherosclerotic heart disease of native coronary artery without angina pectoris: Secondary | ICD-10-CM | POA: Diagnosis not present

## 2020-04-20 DIAGNOSIS — N1831 Chronic kidney disease, stage 3a: Secondary | ICD-10-CM | POA: Diagnosis not present

## 2020-04-20 DIAGNOSIS — I129 Hypertensive chronic kidney disease with stage 1 through stage 4 chronic kidney disease, or unspecified chronic kidney disease: Secondary | ICD-10-CM | POA: Diagnosis not present

## 2020-04-20 DIAGNOSIS — J209 Acute bronchitis, unspecified: Secondary | ICD-10-CM | POA: Diagnosis not present

## 2020-04-20 DIAGNOSIS — J1282 Pneumonia due to coronavirus disease 2019: Secondary | ICD-10-CM | POA: Diagnosis not present

## 2020-04-20 DIAGNOSIS — U071 COVID-19: Secondary | ICD-10-CM | POA: Diagnosis not present

## 2020-04-20 DIAGNOSIS — J9691 Respiratory failure, unspecified with hypoxia: Secondary | ICD-10-CM | POA: Diagnosis not present

## 2020-04-21 ENCOUNTER — Telehealth: Payer: Self-pay

## 2020-04-21 DIAGNOSIS — J1282 Pneumonia due to coronavirus disease 2019: Secondary | ICD-10-CM | POA: Diagnosis not present

## 2020-04-21 DIAGNOSIS — E1122 Type 2 diabetes mellitus with diabetic chronic kidney disease: Secondary | ICD-10-CM | POA: Diagnosis not present

## 2020-04-21 DIAGNOSIS — N1831 Chronic kidney disease, stage 3a: Secondary | ICD-10-CM | POA: Diagnosis not present

## 2020-04-21 DIAGNOSIS — I129 Hypertensive chronic kidney disease with stage 1 through stage 4 chronic kidney disease, or unspecified chronic kidney disease: Secondary | ICD-10-CM | POA: Diagnosis not present

## 2020-04-21 DIAGNOSIS — U071 COVID-19: Secondary | ICD-10-CM | POA: Diagnosis not present

## 2020-04-21 DIAGNOSIS — I2510 Atherosclerotic heart disease of native coronary artery without angina pectoris: Secondary | ICD-10-CM | POA: Diagnosis not present

## 2020-04-21 DIAGNOSIS — J9691 Respiratory failure, unspecified with hypoxia: Secondary | ICD-10-CM | POA: Diagnosis not present

## 2020-04-21 DIAGNOSIS — E1136 Type 2 diabetes mellitus with diabetic cataract: Secondary | ICD-10-CM | POA: Diagnosis not present

## 2020-04-21 DIAGNOSIS — J209 Acute bronchitis, unspecified: Secondary | ICD-10-CM | POA: Diagnosis not present

## 2020-04-21 NOTE — Telephone Encounter (Signed)
Rep advised that forms need more info for pt O2. Ashely advised that he would drop off forms Friday 04-22-20 for me . [place]

## 2020-04-22 ENCOUNTER — Telehealth: Payer: Self-pay | Admitting: Family Medicine

## 2020-04-22 MED ORDER — INSULIN PEN NEEDLE 32G X 4 MM MISC: 1 refills | Status: DC

## 2020-04-22 NOTE — Telephone Encounter (Signed)
  Pt needs refill on needles needs 90 day supply,  2 boxes/quantitiy #180  57mm x 32 G  BD Nano,   He is currently taking 2 injections per day  [facility]

## 2020-04-22 NOTE — Telephone Encounter (Signed)
done

## 2020-04-25 DIAGNOSIS — J1282 Pneumonia due to coronavirus disease 2019: Secondary | ICD-10-CM | POA: Diagnosis not present

## 2020-04-25 DIAGNOSIS — I129 Hypertensive chronic kidney disease with stage 1 through stage 4 chronic kidney disease, or unspecified chronic kidney disease: Secondary | ICD-10-CM | POA: Diagnosis not present

## 2020-04-25 DIAGNOSIS — I2510 Atherosclerotic heart disease of native coronary artery without angina pectoris: Secondary | ICD-10-CM | POA: Diagnosis not present

## 2020-04-25 DIAGNOSIS — E1136 Type 2 diabetes mellitus with diabetic cataract: Secondary | ICD-10-CM | POA: Diagnosis not present

## 2020-04-25 DIAGNOSIS — J209 Acute bronchitis, unspecified: Secondary | ICD-10-CM | POA: Diagnosis not present

## 2020-04-25 DIAGNOSIS — E1122 Type 2 diabetes mellitus with diabetic chronic kidney disease: Secondary | ICD-10-CM | POA: Diagnosis not present

## 2020-04-25 DIAGNOSIS — J9691 Respiratory failure, unspecified with hypoxia: Secondary | ICD-10-CM | POA: Diagnosis not present

## 2020-04-25 DIAGNOSIS — U071 COVID-19: Secondary | ICD-10-CM | POA: Diagnosis not present

## 2020-04-25 DIAGNOSIS — N1831 Chronic kidney disease, stage 3a: Secondary | ICD-10-CM | POA: Diagnosis not present

## 2020-04-27 DIAGNOSIS — E1136 Type 2 diabetes mellitus with diabetic cataract: Secondary | ICD-10-CM | POA: Diagnosis not present

## 2020-04-27 DIAGNOSIS — U071 COVID-19: Secondary | ICD-10-CM | POA: Diagnosis not present

## 2020-04-27 DIAGNOSIS — I129 Hypertensive chronic kidney disease with stage 1 through stage 4 chronic kidney disease, or unspecified chronic kidney disease: Secondary | ICD-10-CM | POA: Diagnosis not present

## 2020-04-27 DIAGNOSIS — E1122 Type 2 diabetes mellitus with diabetic chronic kidney disease: Secondary | ICD-10-CM | POA: Diagnosis not present

## 2020-04-27 DIAGNOSIS — J9691 Respiratory failure, unspecified with hypoxia: Secondary | ICD-10-CM | POA: Diagnosis not present

## 2020-04-27 DIAGNOSIS — J1282 Pneumonia due to coronavirus disease 2019: Secondary | ICD-10-CM | POA: Diagnosis not present

## 2020-04-27 DIAGNOSIS — J209 Acute bronchitis, unspecified: Secondary | ICD-10-CM | POA: Diagnosis not present

## 2020-04-27 DIAGNOSIS — I2510 Atherosclerotic heart disease of native coronary artery without angina pectoris: Secondary | ICD-10-CM | POA: Diagnosis not present

## 2020-04-27 DIAGNOSIS — N1831 Chronic kidney disease, stage 3a: Secondary | ICD-10-CM | POA: Diagnosis not present

## 2020-05-02 DIAGNOSIS — I129 Hypertensive chronic kidney disease with stage 1 through stage 4 chronic kidney disease, or unspecified chronic kidney disease: Secondary | ICD-10-CM | POA: Diagnosis not present

## 2020-05-02 DIAGNOSIS — J1282 Pneumonia due to coronavirus disease 2019: Secondary | ICD-10-CM | POA: Diagnosis not present

## 2020-05-02 DIAGNOSIS — E1136 Type 2 diabetes mellitus with diabetic cataract: Secondary | ICD-10-CM | POA: Diagnosis not present

## 2020-05-02 DIAGNOSIS — I2510 Atherosclerotic heart disease of native coronary artery without angina pectoris: Secondary | ICD-10-CM | POA: Diagnosis not present

## 2020-05-02 DIAGNOSIS — E1122 Type 2 diabetes mellitus with diabetic chronic kidney disease: Secondary | ICD-10-CM | POA: Diagnosis not present

## 2020-05-02 DIAGNOSIS — J209 Acute bronchitis, unspecified: Secondary | ICD-10-CM | POA: Diagnosis not present

## 2020-05-02 DIAGNOSIS — J9691 Respiratory failure, unspecified with hypoxia: Secondary | ICD-10-CM | POA: Diagnosis not present

## 2020-05-02 DIAGNOSIS — U071 COVID-19: Secondary | ICD-10-CM | POA: Diagnosis not present

## 2020-05-02 DIAGNOSIS — N1831 Chronic kidney disease, stage 3a: Secondary | ICD-10-CM | POA: Diagnosis not present

## 2020-05-04 DIAGNOSIS — J9691 Respiratory failure, unspecified with hypoxia: Secondary | ICD-10-CM | POA: Diagnosis not present

## 2020-05-04 DIAGNOSIS — E1136 Type 2 diabetes mellitus with diabetic cataract: Secondary | ICD-10-CM | POA: Diagnosis not present

## 2020-05-04 DIAGNOSIS — I129 Hypertensive chronic kidney disease with stage 1 through stage 4 chronic kidney disease, or unspecified chronic kidney disease: Secondary | ICD-10-CM | POA: Diagnosis not present

## 2020-05-04 DIAGNOSIS — N1831 Chronic kidney disease, stage 3a: Secondary | ICD-10-CM | POA: Diagnosis not present

## 2020-05-04 DIAGNOSIS — E1122 Type 2 diabetes mellitus with diabetic chronic kidney disease: Secondary | ICD-10-CM | POA: Diagnosis not present

## 2020-05-04 DIAGNOSIS — J209 Acute bronchitis, unspecified: Secondary | ICD-10-CM | POA: Diagnosis not present

## 2020-05-04 DIAGNOSIS — U071 COVID-19: Secondary | ICD-10-CM | POA: Diagnosis not present

## 2020-05-04 DIAGNOSIS — I2510 Atherosclerotic heart disease of native coronary artery without angina pectoris: Secondary | ICD-10-CM | POA: Diagnosis not present

## 2020-05-04 DIAGNOSIS — J1282 Pneumonia due to coronavirus disease 2019: Secondary | ICD-10-CM | POA: Diagnosis not present

## 2020-05-09 ENCOUNTER — Telehealth: Payer: Self-pay | Admitting: Family Medicine

## 2020-05-09 DIAGNOSIS — J209 Acute bronchitis, unspecified: Secondary | ICD-10-CM | POA: Diagnosis not present

## 2020-05-09 DIAGNOSIS — J9691 Respiratory failure, unspecified with hypoxia: Secondary | ICD-10-CM | POA: Diagnosis not present

## 2020-05-09 DIAGNOSIS — I2510 Atherosclerotic heart disease of native coronary artery without angina pectoris: Secondary | ICD-10-CM | POA: Diagnosis not present

## 2020-05-09 DIAGNOSIS — J1282 Pneumonia due to coronavirus disease 2019: Secondary | ICD-10-CM | POA: Diagnosis not present

## 2020-05-09 DIAGNOSIS — E1122 Type 2 diabetes mellitus with diabetic chronic kidney disease: Secondary | ICD-10-CM | POA: Diagnosis not present

## 2020-05-09 DIAGNOSIS — I129 Hypertensive chronic kidney disease with stage 1 through stage 4 chronic kidney disease, or unspecified chronic kidney disease: Secondary | ICD-10-CM | POA: Diagnosis not present

## 2020-05-09 DIAGNOSIS — N1831 Chronic kidney disease, stage 3a: Secondary | ICD-10-CM | POA: Diagnosis not present

## 2020-05-09 DIAGNOSIS — U071 COVID-19: Secondary | ICD-10-CM | POA: Diagnosis not present

## 2020-05-09 DIAGNOSIS — E1136 Type 2 diabetes mellitus with diabetic cataract: Secondary | ICD-10-CM | POA: Diagnosis not present

## 2020-05-09 NOTE — Telephone Encounter (Signed)
LVM advising the OK . [place]

## 2020-05-09 NOTE — Telephone Encounter (Signed)
Received a call from [facility] with [place]. Pt is requesting recert for Homehealth PT. She states pt is making good progress just slow. She is requesting 1 time a week for 1 week, 2 times a week for 3 weeks and 1 a week for 4 weeks. Tillie Rung can be reached at [phone].

## 2020-05-09 NOTE — Telephone Encounter (Signed)
ok 

## 2020-05-10 ENCOUNTER — Telehealth: Payer: Self-pay | Admitting: Family Medicine

## 2020-05-10 NOTE — Telephone Encounter (Signed)
Linda called, very upset, tearful that they have not been able to get the portable concentrator for the last 6 weeks.  She states it is very difficult to handle the patient in a wheelchair with the oxygen tank.  She states the paperwork continues to be incorrect and that the insurance will not cover the concentrator if Covid related/Pneumonia related.  I discussed with Maudie Mercury, she has been working on it all this time.  Patient has an appointment tomorrow and this should get straightened out.  [name] said she was stressed, angry and frustrated.  I will await documentation from tomorrow's visit and follow up with [facility].

## 2020-05-11 ENCOUNTER — Telehealth (INDEPENDENT_AMBULATORY_CARE_PROVIDER_SITE_OTHER): Payer: Medicare PPO | Admitting: Family Medicine

## 2020-05-11 ENCOUNTER — Telehealth: Payer: Self-pay | Admitting: Family Medicine

## 2020-05-11 ENCOUNTER — Other Ambulatory Visit: Payer: Self-pay

## 2020-05-11 VITALS — BP 116/72 | Temp 97.0°F | Wt 202.0 lb

## 2020-05-11 DIAGNOSIS — J439 Emphysema, unspecified: Secondary | ICD-10-CM

## 2020-05-11 DIAGNOSIS — J449 Chronic obstructive pulmonary disease, unspecified: Secondary | ICD-10-CM

## 2020-05-11 NOTE — Telephone Encounter (Signed)
Jimmy [name] at [place] [phone] t/w [name] he states [name] out in the field today.  Obtained fax # [phone]. Faxing new office note over to [place].  Request this to be expidited for concentrator.

## 2020-05-11 NOTE — Progress Notes (Signed)
   Subjective:    Patient ID: Jimmy [name], male    DOB: 1946/01/09, 75 y.o.   MRN: [id number]  HPI   Documentation for virtual audio and video telecommunications through [place] encounter: The patient was located at home. The provider was located in the office. The patient did consent to this visit and is aware of possible charges through their insurance for this visit. The other persons participating in this telemedicine service was his wife. Time spent on call was 5 minutes  This virtual service is not related to other E/M service within previous 7 days. This is a follow-up appointment concerning continued need for oxygen therapy.  He does well sedentary with pulse ox in the 94 range however with activity it drops down to 92 and when he is off oxygen it drops into the mid 80s.  He continues on his asthma medication is having no difficulty with that.  He does have evidence of emphysema as his underlying chronic condition.  He is starting to become more mobile and did walk up the ramp however oxygen tubing is interfering with his physical activity.      Review of Systems     Objective:   Physical Exam Alert and in no distress.  No tachypnea noted in his breathing pattern.       Assessment & Plan:  Asthma with irreversible airway obstruction, unspecified asthma severity, unspecified whether complicated, unspecified whether persistent ([place])  Pulmonary emphysema, unspecified emphysema type ([place]) I think he needs to continue on oxygen and we need to work towards getting him an oxygen portable unit so he can become more mobile.

## 2020-05-12 ENCOUNTER — Telehealth: Payer: Self-pay | Admitting: Family Medicine

## 2020-05-12 ENCOUNTER — Other Ambulatory Visit: Payer: Self-pay | Admitting: Family Medicine

## 2020-05-12 ENCOUNTER — Ambulatory Visit: Payer: Medicare PPO | Admitting: Family Medicine

## 2020-05-12 NOTE — Telephone Encounter (Signed)
Called Ashley at [place] this morning, he will review and call me back.  He states as long as mid 48 with oxygen should cover portable concentrator.  Called Ashley at [place] this evening as I have not heard from him.  He stated Estill Bamberg Ankrum with [facility] should have called me.  Called Amanda at [place], she said she will not know anything further until tomorrow and will call me back.

## 2020-05-12 NOTE — Telephone Encounter (Signed)
Is this okay to refill? 

## 2020-05-13 DIAGNOSIS — N1831 Chronic kidney disease, stage 3a: Secondary | ICD-10-CM | POA: Diagnosis not present

## 2020-05-13 DIAGNOSIS — I129 Hypertensive chronic kidney disease with stage 1 through stage 4 chronic kidney disease, or unspecified chronic kidney disease: Secondary | ICD-10-CM | POA: Diagnosis not present

## 2020-05-13 DIAGNOSIS — J209 Acute bronchitis, unspecified: Secondary | ICD-10-CM | POA: Diagnosis not present

## 2020-05-13 DIAGNOSIS — E1136 Type 2 diabetes mellitus with diabetic cataract: Secondary | ICD-10-CM | POA: Diagnosis not present

## 2020-05-13 DIAGNOSIS — J1282 Pneumonia due to coronavirus disease 2019: Secondary | ICD-10-CM | POA: Diagnosis not present

## 2020-05-13 DIAGNOSIS — J9691 Respiratory failure, unspecified with hypoxia: Secondary | ICD-10-CM | POA: Diagnosis not present

## 2020-05-13 DIAGNOSIS — E1122 Type 2 diabetes mellitus with diabetic chronic kidney disease: Secondary | ICD-10-CM | POA: Diagnosis not present

## 2020-05-13 DIAGNOSIS — I2510 Atherosclerotic heart disease of native coronary artery without angina pectoris: Secondary | ICD-10-CM | POA: Diagnosis not present

## 2020-05-13 DIAGNOSIS — U071 COVID-19: Secondary | ICD-10-CM | POA: Diagnosis not present

## 2020-05-14 DIAGNOSIS — U071 COVID-19: Secondary | ICD-10-CM | POA: Diagnosis not present

## 2020-05-16 NOTE — Telephone Encounter (Signed)
I forwarded ov notes from 5/12 to [place] on 5/12.  Talked with [name] same day, he said Alan Mulder in billing would call me next day.    Today I have called [name] again, lmtrc. I called Estill Bamberg and she is out until tomorrow afternoon. I spoke with Bethanne Ginger, she said note had been entered that I sent. And should now go to billing.  I explained I had sent that on 5/12.  She said we should have an answer by end of day tomorrow.  I requested that she call me back with update.  Called [name], explained our status.

## 2020-05-17 DIAGNOSIS — J1282 Pneumonia due to coronavirus disease 2019: Secondary | ICD-10-CM | POA: Diagnosis not present

## 2020-05-17 DIAGNOSIS — I129 Hypertensive chronic kidney disease with stage 1 through stage 4 chronic kidney disease, or unspecified chronic kidney disease: Secondary | ICD-10-CM | POA: Diagnosis not present

## 2020-05-17 DIAGNOSIS — J9691 Respiratory failure, unspecified with hypoxia: Secondary | ICD-10-CM | POA: Diagnosis not present

## 2020-05-17 DIAGNOSIS — J209 Acute bronchitis, unspecified: Secondary | ICD-10-CM | POA: Diagnosis not present

## 2020-05-17 DIAGNOSIS — E1136 Type 2 diabetes mellitus with diabetic cataract: Secondary | ICD-10-CM | POA: Diagnosis not present

## 2020-05-17 DIAGNOSIS — I2510 Atherosclerotic heart disease of native coronary artery without angina pectoris: Secondary | ICD-10-CM | POA: Diagnosis not present

## 2020-05-17 DIAGNOSIS — E1122 Type 2 diabetes mellitus with diabetic chronic kidney disease: Secondary | ICD-10-CM | POA: Diagnosis not present

## 2020-05-17 DIAGNOSIS — U071 COVID-19: Secondary | ICD-10-CM | POA: Diagnosis not present

## 2020-05-17 DIAGNOSIS — N1831 Chronic kidney disease, stage 3a: Secondary | ICD-10-CM | POA: Diagnosis not present

## 2020-05-19 DIAGNOSIS — N1831 Chronic kidney disease, stage 3a: Secondary | ICD-10-CM | POA: Diagnosis not present

## 2020-05-19 DIAGNOSIS — I2510 Atherosclerotic heart disease of native coronary artery without angina pectoris: Secondary | ICD-10-CM | POA: Diagnosis not present

## 2020-05-19 DIAGNOSIS — E1136 Type 2 diabetes mellitus with diabetic cataract: Secondary | ICD-10-CM | POA: Diagnosis not present

## 2020-05-19 DIAGNOSIS — I129 Hypertensive chronic kidney disease with stage 1 through stage 4 chronic kidney disease, or unspecified chronic kidney disease: Secondary | ICD-10-CM | POA: Diagnosis not present

## 2020-05-19 DIAGNOSIS — J1282 Pneumonia due to coronavirus disease 2019: Secondary | ICD-10-CM | POA: Diagnosis not present

## 2020-05-19 DIAGNOSIS — E1122 Type 2 diabetes mellitus with diabetic chronic kidney disease: Secondary | ICD-10-CM | POA: Diagnosis not present

## 2020-05-19 DIAGNOSIS — J9691 Respiratory failure, unspecified with hypoxia: Secondary | ICD-10-CM | POA: Diagnosis not present

## 2020-05-19 DIAGNOSIS — U071 COVID-19: Secondary | ICD-10-CM | POA: Diagnosis not present

## 2020-05-19 DIAGNOSIS — J209 Acute bronchitis, unspecified: Secondary | ICD-10-CM | POA: Diagnosis not present

## 2020-05-23 DIAGNOSIS — Z936 Other artificial openings of urinary tract status: Secondary | ICD-10-CM | POA: Diagnosis not present

## 2020-05-23 DIAGNOSIS — E1122 Type 2 diabetes mellitus with diabetic chronic kidney disease: Secondary | ICD-10-CM | POA: Diagnosis not present

## 2020-05-23 DIAGNOSIS — U071 COVID-19: Secondary | ICD-10-CM | POA: Diagnosis not present

## 2020-05-23 DIAGNOSIS — I129 Hypertensive chronic kidney disease with stage 1 through stage 4 chronic kidney disease, or unspecified chronic kidney disease: Secondary | ICD-10-CM | POA: Diagnosis not present

## 2020-05-23 DIAGNOSIS — J1282 Pneumonia due to coronavirus disease 2019: Secondary | ICD-10-CM | POA: Diagnosis not present

## 2020-05-23 DIAGNOSIS — Z8551 Personal history of malignant neoplasm of bladder: Secondary | ICD-10-CM | POA: Diagnosis not present

## 2020-05-23 DIAGNOSIS — J9691 Respiratory failure, unspecified with hypoxia: Secondary | ICD-10-CM | POA: Diagnosis not present

## 2020-05-23 DIAGNOSIS — I2510 Atherosclerotic heart disease of native coronary artery without angina pectoris: Secondary | ICD-10-CM | POA: Diagnosis not present

## 2020-05-23 DIAGNOSIS — E1136 Type 2 diabetes mellitus with diabetic cataract: Secondary | ICD-10-CM | POA: Diagnosis not present

## 2020-05-23 DIAGNOSIS — N1831 Chronic kidney disease, stage 3a: Secondary | ICD-10-CM | POA: Diagnosis not present

## 2020-05-23 DIAGNOSIS — J209 Acute bronchitis, unspecified: Secondary | ICD-10-CM | POA: Diagnosis not present

## 2020-05-25 ENCOUNTER — Telehealth: Payer: Self-pay | Admitting: Family Medicine

## 2020-05-25 NOTE — Telephone Encounter (Signed)
Called [facility] spoke with Jimmy [name], she states the portable concentrator does not have a tracking number yet, so she does not know when it is coming.  She has been waiting on other ones since end of April.  I explained that is not acceptable to not know when it is coming.  She checked with her boss and they will send one out from [place] and they should have by end of week.  [place] [id number] advised [name] of same.

## 2020-05-26 ENCOUNTER — Other Ambulatory Visit: Payer: Self-pay | Admitting: Family Medicine

## 2020-05-26 DIAGNOSIS — N1831 Chronic kidney disease, stage 3a: Secondary | ICD-10-CM | POA: Diagnosis not present

## 2020-05-26 DIAGNOSIS — U071 COVID-19: Secondary | ICD-10-CM | POA: Diagnosis not present

## 2020-05-26 DIAGNOSIS — E1136 Type 2 diabetes mellitus with diabetic cataract: Secondary | ICD-10-CM | POA: Diagnosis not present

## 2020-05-26 DIAGNOSIS — J1282 Pneumonia due to coronavirus disease 2019: Secondary | ICD-10-CM | POA: Diagnosis not present

## 2020-05-26 DIAGNOSIS — I129 Hypertensive chronic kidney disease with stage 1 through stage 4 chronic kidney disease, or unspecified chronic kidney disease: Secondary | ICD-10-CM | POA: Diagnosis not present

## 2020-05-26 DIAGNOSIS — J209 Acute bronchitis, unspecified: Secondary | ICD-10-CM | POA: Diagnosis not present

## 2020-05-26 DIAGNOSIS — E1122 Type 2 diabetes mellitus with diabetic chronic kidney disease: Secondary | ICD-10-CM | POA: Diagnosis not present

## 2020-05-26 DIAGNOSIS — J9691 Respiratory failure, unspecified with hypoxia: Secondary | ICD-10-CM | POA: Diagnosis not present

## 2020-05-26 DIAGNOSIS — I2510 Atherosclerotic heart disease of native coronary artery without angina pectoris: Secondary | ICD-10-CM | POA: Diagnosis not present

## 2020-05-30 DIAGNOSIS — E1122 Type 2 diabetes mellitus with diabetic chronic kidney disease: Secondary | ICD-10-CM | POA: Diagnosis not present

## 2020-05-30 DIAGNOSIS — J9691 Respiratory failure, unspecified with hypoxia: Secondary | ICD-10-CM | POA: Diagnosis not present

## 2020-05-30 DIAGNOSIS — J209 Acute bronchitis, unspecified: Secondary | ICD-10-CM | POA: Diagnosis not present

## 2020-05-30 DIAGNOSIS — N1831 Chronic kidney disease, stage 3a: Secondary | ICD-10-CM | POA: Diagnosis not present

## 2020-05-30 DIAGNOSIS — U071 COVID-19: Secondary | ICD-10-CM | POA: Diagnosis not present

## 2020-05-30 DIAGNOSIS — I129 Hypertensive chronic kidney disease with stage 1 through stage 4 chronic kidney disease, or unspecified chronic kidney disease: Secondary | ICD-10-CM | POA: Diagnosis not present

## 2020-05-30 DIAGNOSIS — E1136 Type 2 diabetes mellitus with diabetic cataract: Secondary | ICD-10-CM | POA: Diagnosis not present

## 2020-05-30 DIAGNOSIS — J1282 Pneumonia due to coronavirus disease 2019: Secondary | ICD-10-CM | POA: Diagnosis not present

## 2020-05-30 DIAGNOSIS — I2510 Atherosclerotic heart disease of native coronary artery without angina pectoris: Secondary | ICD-10-CM | POA: Diagnosis not present

## 2020-05-31 ENCOUNTER — Telehealth: Payer: Self-pay | Admitting: Family Medicine

## 2020-05-31 NOTE — Telephone Encounter (Signed)
[name] called and states she spoke with [facility] this morning and they had no idea what she was talking about, only to say they could see a billing issue.  She asked for the VP name and they hung up the phone.  I called [facility], spoke with the boss [name] and he states he has the portable concentrator in his hands and will deliver out today.  He will call [name] right now and advise of same.  I called [place], line would only ring.

## 2020-06-01 ENCOUNTER — Telehealth: Payer: Self-pay | Admitting: Family Medicine

## 2020-06-01 NOTE — Telephone Encounter (Signed)
[name] called today and they have received the Portable Concentrator!

## 2020-06-03 DIAGNOSIS — J209 Acute bronchitis, unspecified: Secondary | ICD-10-CM | POA: Diagnosis not present

## 2020-06-03 DIAGNOSIS — I129 Hypertensive chronic kidney disease with stage 1 through stage 4 chronic kidney disease, or unspecified chronic kidney disease: Secondary | ICD-10-CM | POA: Diagnosis not present

## 2020-06-03 DIAGNOSIS — J1282 Pneumonia due to coronavirus disease 2019: Secondary | ICD-10-CM | POA: Diagnosis not present

## 2020-06-03 DIAGNOSIS — E1122 Type 2 diabetes mellitus with diabetic chronic kidney disease: Secondary | ICD-10-CM | POA: Diagnosis not present

## 2020-06-03 DIAGNOSIS — I2510 Atherosclerotic heart disease of native coronary artery without angina pectoris: Secondary | ICD-10-CM | POA: Diagnosis not present

## 2020-06-03 DIAGNOSIS — N1831 Chronic kidney disease, stage 3a: Secondary | ICD-10-CM | POA: Diagnosis not present

## 2020-06-03 DIAGNOSIS — U071 COVID-19: Secondary | ICD-10-CM | POA: Diagnosis not present

## 2020-06-03 DIAGNOSIS — J9691 Respiratory failure, unspecified with hypoxia: Secondary | ICD-10-CM | POA: Diagnosis not present

## 2020-06-03 DIAGNOSIS — E1136 Type 2 diabetes mellitus with diabetic cataract: Secondary | ICD-10-CM | POA: Diagnosis not present

## 2020-06-07 DIAGNOSIS — J1282 Pneumonia due to coronavirus disease 2019: Secondary | ICD-10-CM | POA: Diagnosis not present

## 2020-06-07 DIAGNOSIS — J209 Acute bronchitis, unspecified: Secondary | ICD-10-CM | POA: Diagnosis not present

## 2020-06-07 DIAGNOSIS — I2510 Atherosclerotic heart disease of native coronary artery without angina pectoris: Secondary | ICD-10-CM | POA: Diagnosis not present

## 2020-06-07 DIAGNOSIS — I129 Hypertensive chronic kidney disease with stage 1 through stage 4 chronic kidney disease, or unspecified chronic kidney disease: Secondary | ICD-10-CM | POA: Diagnosis not present

## 2020-06-07 DIAGNOSIS — N1831 Chronic kidney disease, stage 3a: Secondary | ICD-10-CM | POA: Diagnosis not present

## 2020-06-07 DIAGNOSIS — U071 COVID-19: Secondary | ICD-10-CM | POA: Diagnosis not present

## 2020-06-07 DIAGNOSIS — E1136 Type 2 diabetes mellitus with diabetic cataract: Secondary | ICD-10-CM | POA: Diagnosis not present

## 2020-06-07 DIAGNOSIS — E1122 Type 2 diabetes mellitus with diabetic chronic kidney disease: Secondary | ICD-10-CM | POA: Diagnosis not present

## 2020-06-07 DIAGNOSIS — J9691 Respiratory failure, unspecified with hypoxia: Secondary | ICD-10-CM | POA: Diagnosis not present

## 2020-06-08 DIAGNOSIS — Z8546 Personal history of malignant neoplasm of prostate: Secondary | ICD-10-CM | POA: Diagnosis not present

## 2020-06-13 ENCOUNTER — Telehealth: Payer: Self-pay | Admitting: Family Medicine

## 2020-06-13 DIAGNOSIS — R161 Splenomegaly, not elsewhere classified: Secondary | ICD-10-CM | POA: Diagnosis not present

## 2020-06-13 DIAGNOSIS — C678 Malignant neoplasm of overlapping sites of bladder: Secondary | ICD-10-CM | POA: Diagnosis not present

## 2020-06-13 DIAGNOSIS — N1330 Unspecified hydronephrosis: Secondary | ICD-10-CM | POA: Diagnosis not present

## 2020-06-13 DIAGNOSIS — R918 Other nonspecific abnormal finding of lung field: Secondary | ICD-10-CM | POA: Diagnosis not present

## 2020-06-13 NOTE — Telephone Encounter (Signed)
Jimmy [name] called and states the concentrator is not working correctly.  It drops from 100% to 80 to 60% fairly rapidly.  Jimmy [name] has called [facility] and spoke with Alyse Low and Alyse Low said treat it like a computer, take the battery out and re-install and see if that fixes it.  If not Alyse Low will bring another one out to her.  Jimmy [name] just wanted [place] in the loop.

## 2020-06-14 DIAGNOSIS — U071 COVID-19: Secondary | ICD-10-CM | POA: Diagnosis not present

## 2020-06-14 DIAGNOSIS — J449 Chronic obstructive pulmonary disease, unspecified: Secondary | ICD-10-CM | POA: Diagnosis not present

## 2020-06-15 DIAGNOSIS — C678 Malignant neoplasm of overlapping sites of bladder: Secondary | ICD-10-CM | POA: Diagnosis not present

## 2020-06-15 DIAGNOSIS — Z8546 Personal history of malignant neoplasm of prostate: Secondary | ICD-10-CM | POA: Diagnosis not present

## 2020-06-16 ENCOUNTER — Telehealth: Payer: Self-pay | Admitting: Oncology

## 2020-06-16 DIAGNOSIS — J1282 Pneumonia due to coronavirus disease 2019: Secondary | ICD-10-CM | POA: Diagnosis not present

## 2020-06-16 DIAGNOSIS — I2510 Atherosclerotic heart disease of native coronary artery without angina pectoris: Secondary | ICD-10-CM | POA: Diagnosis not present

## 2020-06-16 DIAGNOSIS — U071 COVID-19: Secondary | ICD-10-CM | POA: Diagnosis not present

## 2020-06-16 DIAGNOSIS — I129 Hypertensive chronic kidney disease with stage 1 through stage 4 chronic kidney disease, or unspecified chronic kidney disease: Secondary | ICD-10-CM | POA: Diagnosis not present

## 2020-06-16 DIAGNOSIS — N1831 Chronic kidney disease, stage 3a: Secondary | ICD-10-CM | POA: Diagnosis not present

## 2020-06-16 DIAGNOSIS — J209 Acute bronchitis, unspecified: Secondary | ICD-10-CM | POA: Diagnosis not present

## 2020-06-16 DIAGNOSIS — E1122 Type 2 diabetes mellitus with diabetic chronic kidney disease: Secondary | ICD-10-CM | POA: Diagnosis not present

## 2020-06-16 DIAGNOSIS — J9691 Respiratory failure, unspecified with hypoxia: Secondary | ICD-10-CM | POA: Diagnosis not present

## 2020-06-16 DIAGNOSIS — E1136 Type 2 diabetes mellitus with diabetic cataract: Secondary | ICD-10-CM | POA: Diagnosis not present

## 2020-06-16 NOTE — Telephone Encounter (Signed)
Scheduled appt per 6/16 sch message - pt wife is aware.

## 2020-06-20 ENCOUNTER — Encounter: Payer: Self-pay | Admitting: Oncology

## 2020-06-20 ENCOUNTER — Other Ambulatory Visit: Payer: Self-pay

## 2020-06-20 ENCOUNTER — Inpatient Hospital Stay: Payer: Medicare PPO | Attending: Oncology | Admitting: Oncology

## 2020-06-20 VITALS — BP 114/50 | HR 82 | Temp 97.5°F | Resp 17 | Ht 68.0 in | Wt 214.8 lb

## 2020-06-20 DIAGNOSIS — C679 Malignant neoplasm of bladder, unspecified: Secondary | ICD-10-CM | POA: Diagnosis not present

## 2020-06-20 DIAGNOSIS — Z9221 Personal history of antineoplastic chemotherapy: Secondary | ICD-10-CM | POA: Insufficient documentation

## 2020-06-20 DIAGNOSIS — Z8616 Personal history of COVID-19: Secondary | ICD-10-CM | POA: Insufficient documentation

## 2020-06-20 DIAGNOSIS — J8410 Pulmonary fibrosis, unspecified: Secondary | ICD-10-CM | POA: Diagnosis not present

## 2020-06-20 DIAGNOSIS — Z79899 Other long term (current) drug therapy: Secondary | ICD-10-CM | POA: Insufficient documentation

## 2020-06-20 DIAGNOSIS — R161 Splenomegaly, not elsewhere classified: Secondary | ICD-10-CM | POA: Diagnosis not present

## 2020-06-20 DIAGNOSIS — K7460 Unspecified cirrhosis of liver: Secondary | ICD-10-CM | POA: Diagnosis not present

## 2020-06-20 DIAGNOSIS — J439 Emphysema, unspecified: Secondary | ICD-10-CM | POA: Diagnosis not present

## 2020-06-20 DIAGNOSIS — I700 Atherosclerosis of aorta: Secondary | ICD-10-CM | POA: Diagnosis not present

## 2020-06-20 NOTE — Progress Notes (Signed)
Hematology and Oncology Follow Up Visit  GRYFFIN ALTICE [id number] 02-19-1946 74 y.o. 06/20/2020 9:18 AM [name], MDLalonde, [name], MD   Principle Diagnosis: 74 year old man with bladder cancer diagnosed in June 2020.  He was found to have T3aN0 high-grade urothelial carcinoma.    Prior Therapy: TURBT with right ureteral stent placement as well as left ureteral stent placement at that time.  The final pathology showed infiltrating high-grade papillary adenocarcinoma invading into the detrusor muscle with lymphovascular invasion identified.  This was completed on 06/22/2019.    Neoadjuvant chemotherapy started on August 07, 2019 with cisplatin and gemcitabine started on August 07, 2019.  He completed 4 cycles of chemotherapy on October 16, 2019.  He is status post radical cystectomy and lymph node dissection on December 10, 2019.  The final pathology showed 1.5 cm residual tumor with invasion into the perivesicular soft tissue.  Final pathological stage was T3a and 0 with 0 out of 10 lymph nodes involved.  Current therapy: Observation and surveillance.  Interim History: Mr. Ridley returns today for a follow-up visit.  Since last visit, he reports few complications and health issues.  He was hospitalized in February 2021 for Covid pneumonia and required hospitalization up till February 21.  He had sustained lung injury and has been oxygen dependent since that time.  Since his discharge has completed his Covid vaccination for the most recent vaccination given 2 weeks ago.  He does report some fatigue tiredness and so oxygen dependence.  He denies any fevers chills sweats.  Denies any diarrhea or urinary complaints.  He does use a walker at home although uses wheelchair for his visits.                Medications: Unchanged on review. Current Outpatient Medications  Medication Sig Dispense Refill  . Accu-Chek Softclix Lancets lancets 1 each by Other route in the morning, at  noon, and at bedtime. Use as instructed 300 each 2  . acetaminophen (TYLENOL) 325 MG tablet Take 2 tablets (650 mg total) by mouth every 6 (six) hours as needed for mild pain or headache (fever >/= 101). 30 tablet 0  . albuterol (VENTOLIN HFA) 108 (90 Base) MCG/ACT inhaler Inhale 2 puffs into the lungs every 4 (four) hours as needed for wheezing or shortness of breath. (Patient not taking: Reported on 04/11/2020) 8 g 0  . apixaban (ELIQUIS) 5 MG TABS tablet Take 1 tablet (5 mg total) by mouth 2 (two) times daily. 180 tablet 1  . COMBIVENT RESPIMAT 20-100 MCG/ACT AERS respimat USE 1 PUFF 4 TIMES A DAY RINSE MOUTH AFTER USE 4 g 5  . glucose blood test strip 1 each by Other route in the morning, at noon, and at bedtime. Use as instructed 300 each 2  . Insulin Pen Needle 29G X 12MM MISC Use as directed 200 each 0  . Insulin Pen Needle 32G X 4 MM MISC Use for injections twice daily 200 each 1  . LEVEMIR FLEXTOUCH 100 UNIT/ML FlexPen INJECT 16 UNITS TWICE A DAY 15 mL 0  . metoprolol tartrate (LOPRESSOR) 25 MG tablet TAKE 1/2 TABLET TWICE A DAY 90 tablet 1  . Multiple Vitamin (MULTIVITAMIN WITH MINERALS) TABS tablet Take 1 tablet by mouth daily. 30 tablet 0  . pantoprazole (PROTONIX) 40 MG tablet TAKE 1 TABLET TWICE A DAY 180 tablet 1  . polyethylene glycol (MIRALAX / GLYCOLAX) 17 g packet Take 17 g by mouth daily as needed for moderate constipation or severe  constipation. 14 each 0  . traMADol (ULTRAM) 50 MG tablet      No current facility-administered medications for this visit.     Allergies:  Allergies  Allergen Reactions  . Ace Inhibitors Cough  . Cephalexin Hives, Diarrhea and Nausea Only    Tolerates Ancef, Rocephin, Cefoxitin  . Crestor [Rosuvastatin Calcium]   . Lipitor [Atorvastatin Calcium] Other (See Comments)    MUSCLE PAINS   . Plavix [Clopidogrel Bisulfate] Hives  . Vancomycin Nausea And Vomiting    Po route caused n/v.        Physical Exam:  Blood pressure (!) 114/50,  pulse 82, temperature (!) 97.5 F (36.4 C), temperature source Temporal, resp. rate 17, height 5\' 8"  (1.727 m), weight 214 lb 12.8 oz (97.4 kg), SpO2 94 %.      ECOG: 2    General appearance: Comfortable appearing without any discomfort Head: Normocephalic without any trauma Oropharynx: Mucous membranes are moist and pink without any thrush or ulcers. Eyes: Pupils are equal and round reactive to light. Lymph nodes: No cervical, supraclavicular, inguinal or axillary lymphadenopathy.   Heart:regular rate and rhythm.  S1 and S2 without leg edema. Lung: Clear without any rhonchi or wheezes.  No dullness to percussion. Abdomin: Soft, nontender, nondistended with good bowel sounds.  No hepatosplenomegaly. Musculoskeletal: No joint deformity or effusion.  Full range of motion noted. Neurological: No deficits noted on motor, sensory and deep tendon reflex exam. Skin: No petechial rash or dryness.  Appeared moist.          Lab Results: Lab Results  Component Value Date   WBC 6.7 03/17/2020   HGB 11.4 (L) 03/17/2020   HCT 35.4 (L) 03/17/2020   MCV 88 03/17/2020   PLT 225 03/17/2020     Chemistry      Component Value Date/Time   NA 143 03/17/2020 1024   K 4.8 03/17/2020 1024   CL 108 (H) 03/17/2020 1024   CO2 21 03/17/2020 1024   BUN 13 03/17/2020 1024   CREATININE 1.00 03/17/2020 1024   CREATININE 1.39 (H) 01/27/2020 0927   CREATININE 0.89 11/19/2016 0920      Component Value Date/Time   CALCIUM 9.4 03/17/2020 1024   ALKPHOS 163 (H) 03/17/2020 1024   AST 28 03/17/2020 1024   AST 67 (H) 01/27/2020 0927   ALT 21 03/17/2020 1024   ALT 37 01/27/2020 0927   BILITOT <0.2 03/17/2020 1024   BILITOT 0.4 01/27/2020 0927      IMPRESSION: 1. Multiple new scattered solid pulmonary nodules in the lungs bilaterally, largest 1.2 cm in the posterior right upper lobe, suspicious for pulmonary metastases. 2. New mild bilateral hydroureteronephrosis, right greater than left.  Urothelial wall thickening and hyperenhancement throughout the renal collecting systems and ureters bilaterally. Haziness of the fat surrounding the bilateral ureters. Findings are nonspecific and most likely inflammatory. No suspicious renal masses or discrete upper tract urothelial lesions. 3. No evidence of local tumor recurrence in the cystoprostatectomy bed. 4. No evidence of metastatic disease in the abdomen or pelvis. 5. Cirrhosis. No liver masses. Moderate splenomegaly, mildly increased. Trace pelvic ascites. 6. Moderate to severe emphysema with mild diffuse bronchial wall thickening, suggesting COPD. 7. Nonspecific basilar predominant pulmonary fibrosis, mildly worsened since 11/02/2019 chest CT, suspicious for usual interstitial pneumonia (UIP). 8. Aortic Atherosclerosis (ICD10-I70.0) and Emphysema (ICD10-J43.9).  Impression and Plan:   74 year old man with:  1.  T3aN0 high-grade urothelial carcinoma of the bladder diagnosed in June 2020.  He was found to  have no disease in the lymph node on the time of cystectomy.     He is currently on active surveillance and his most recent scan completed on June 14 was reviewed today and discussed with the patient and his wife.  He does have bilateral pulmonary nodules with the largest of which was 1.2 cm in the posterior right upper lobe is suspicious for metastasis.  The natural course of this disease was reviewed and treatment options were reiterated.  These pulmonary nodules could certainly represent metastatic disease but could also represent other etiologies such as septic emboli, infectious process given his recent Covid pneumonia among other considerations.  We discussed the treatment options for metastatic urothelial carcinoma with lung involvement if this is the case at this time.  A Veress needle candidate for Pembrolizumab as a salvage options.  Complication associated with this therapy include immune mediated complications,  fatigue, dermatitis and nausea  I have recommended repeating a PET scan in the next 3 to 4 weeks to reassess these pulmonary nodule and assess any growth at that time.  These nodules do not suggest malignancy, I recommend continued observation and deferring systemic therapy if there is a clear progression.  Obtaining tissue biopsy would be difficult at this time.  He is agreeable with this plan.  2. IV access:Port-A-Cath has been removed and any future treatment will be administered peripherally.  3.  Pulmonary nodules: Suspicious for malignancy although other etiologies considered.  Repeat PET imaging in 4 weeks and assess at that time.  4. Follow-up:  He will return in July 2021 for repeat PET scan and MD follow-up.  30  minutes were dedicated to this visit.  The time was spent on reviewing imaging studies, discussing differential diagnosis, discussing treatment options and future plan of care review.      Zola Button, MD 6/21/20219:18 AM

## 2020-06-22 ENCOUNTER — Telehealth: Payer: Self-pay | Admitting: Family Medicine

## 2020-06-22 NOTE — Telephone Encounter (Signed)
[name] called and said she is pretty sure Jimmy [name] has gout in his toe, its swollen and red. She said he is unable to make it to the office because he can not stand. She wants to see if there is anything he can do in the mean time to relieve the the pain. She has tried hot and cold compress and that doesn't help

## 2020-06-22 NOTE — Telephone Encounter (Signed)
Have him use 2 Aleve twice per day.

## 2020-06-22 NOTE — Telephone Encounter (Signed)
I called pt. Wife back and she is aware of your recommendations.

## 2020-06-23 ENCOUNTER — Telehealth: Payer: Self-pay | Admitting: Family Medicine

## 2020-06-23 DIAGNOSIS — U071 COVID-19: Secondary | ICD-10-CM | POA: Diagnosis not present

## 2020-06-23 DIAGNOSIS — E1122 Type 2 diabetes mellitus with diabetic chronic kidney disease: Secondary | ICD-10-CM | POA: Diagnosis not present

## 2020-06-23 DIAGNOSIS — J1282 Pneumonia due to coronavirus disease 2019: Secondary | ICD-10-CM | POA: Diagnosis not present

## 2020-06-23 DIAGNOSIS — I129 Hypertensive chronic kidney disease with stage 1 through stage 4 chronic kidney disease, or unspecified chronic kidney disease: Secondary | ICD-10-CM | POA: Diagnosis not present

## 2020-06-23 DIAGNOSIS — J9691 Respiratory failure, unspecified with hypoxia: Secondary | ICD-10-CM | POA: Diagnosis not present

## 2020-06-23 DIAGNOSIS — I2510 Atherosclerotic heart disease of native coronary artery without angina pectoris: Secondary | ICD-10-CM | POA: Diagnosis not present

## 2020-06-23 DIAGNOSIS — J209 Acute bronchitis, unspecified: Secondary | ICD-10-CM | POA: Diagnosis not present

## 2020-06-23 DIAGNOSIS — N1831 Chronic kidney disease, stage 3a: Secondary | ICD-10-CM | POA: Diagnosis not present

## 2020-06-23 DIAGNOSIS — E1136 Type 2 diabetes mellitus with diabetic cataract: Secondary | ICD-10-CM | POA: Diagnosis not present

## 2020-06-23 NOTE — Telephone Encounter (Signed)
Tillie Rung from [facility] home care is calling requesting orders for a a nurse evaluation states that ed big toe on the left side is red and warm to the touch she thinks it is gout, states he does not want to come to the dr, and has been in the bed for 2 days, she wants to get whatever blood work there is to determine if it is gout, she can be reached at [phone] states he will not stand on it because it is so sore, informed her that you was out of the office today

## 2020-06-24 ENCOUNTER — Other Ambulatory Visit: Payer: Self-pay

## 2020-06-24 ENCOUNTER — Telehealth: Payer: Self-pay | Admitting: Family Medicine

## 2020-06-24 MED ORDER — COLCHICINE 0.6 MG PO TABS: 0.6000 mg | ORAL_TABLET | Freq: Two times a day (BID) | ORAL | 0 refills | Status: DC

## 2020-06-24 MED ORDER — COLCHICINE 0.6 MG PO CAPS: 0.6000 mg | ORAL_CAPSULE | Freq: Two times a day (BID) | ORAL | 0 refills | Status: DC

## 2020-06-24 NOTE — Telephone Encounter (Signed)
He has not responded to Aleve and I will therefore call in colchicine to help with his gout symptoms.  They did send a picture and it does indeed look like gout.

## 2020-06-24 NOTE — Telephone Encounter (Signed)
Pt's wife called and wanted to know when she should see improvement is pt's foot. Should she see in days or a week? She also wants to know when she should follow up? Please advise.

## 2020-06-24 NOTE — Telephone Encounter (Signed)
Let her know that I did call in colchicine and it is twice per day.

## 2020-06-27 ENCOUNTER — Telehealth: Payer: Self-pay | Admitting: Family Medicine

## 2020-06-27 NOTE — Telephone Encounter (Signed)
Jimmy [name] called and wants to know how long Jimmy [name] needs to be on medication. Please advise at [phone].

## 2020-06-28 DIAGNOSIS — I2510 Atherosclerotic heart disease of native coronary artery without angina pectoris: Secondary | ICD-10-CM | POA: Diagnosis not present

## 2020-06-28 DIAGNOSIS — N1831 Chronic kidney disease, stage 3a: Secondary | ICD-10-CM | POA: Diagnosis not present

## 2020-06-28 DIAGNOSIS — J1282 Pneumonia due to coronavirus disease 2019: Secondary | ICD-10-CM | POA: Diagnosis not present

## 2020-06-28 DIAGNOSIS — E1136 Type 2 diabetes mellitus with diabetic cataract: Secondary | ICD-10-CM | POA: Diagnosis not present

## 2020-06-28 DIAGNOSIS — E1122 Type 2 diabetes mellitus with diabetic chronic kidney disease: Secondary | ICD-10-CM | POA: Diagnosis not present

## 2020-06-28 DIAGNOSIS — U071 COVID-19: Secondary | ICD-10-CM | POA: Diagnosis not present

## 2020-06-28 DIAGNOSIS — J209 Acute bronchitis, unspecified: Secondary | ICD-10-CM | POA: Diagnosis not present

## 2020-06-28 DIAGNOSIS — J9691 Respiratory failure, unspecified with hypoxia: Secondary | ICD-10-CM | POA: Diagnosis not present

## 2020-06-28 DIAGNOSIS — I129 Hypertensive chronic kidney disease with stage 1 through stage 4 chronic kidney disease, or unspecified chronic kidney disease: Secondary | ICD-10-CM | POA: Diagnosis not present

## 2020-07-01 ENCOUNTER — Telehealth: Payer: Self-pay | Admitting: Family Medicine

## 2020-07-01 NOTE — Telephone Encounter (Signed)
  Spoke with wife about patient medications being listed incorrectly in his chart  Pt is taking Metformin 1000mg  once daily but that medication has been deleted from his chart  And on his pantoprazole 40 mg that was changed to once daily and directions are still showing twice daily  Please update medications in patient chart

## 2020-07-05 ENCOUNTER — Other Ambulatory Visit: Payer: Self-pay

## 2020-07-05 NOTE — Telephone Encounter (Signed)
Changes made to pt chart . [place]

## 2020-07-08 DIAGNOSIS — J209 Acute bronchitis, unspecified: Secondary | ICD-10-CM | POA: Diagnosis not present

## 2020-07-08 DIAGNOSIS — J1282 Pneumonia due to coronavirus disease 2019: Secondary | ICD-10-CM | POA: Diagnosis not present

## 2020-07-08 DIAGNOSIS — I129 Hypertensive chronic kidney disease with stage 1 through stage 4 chronic kidney disease, or unspecified chronic kidney disease: Secondary | ICD-10-CM | POA: Diagnosis not present

## 2020-07-08 DIAGNOSIS — U071 COVID-19: Secondary | ICD-10-CM | POA: Diagnosis not present

## 2020-07-08 DIAGNOSIS — N1831 Chronic kidney disease, stage 3a: Secondary | ICD-10-CM | POA: Diagnosis not present

## 2020-07-08 DIAGNOSIS — I2510 Atherosclerotic heart disease of native coronary artery without angina pectoris: Secondary | ICD-10-CM | POA: Diagnosis not present

## 2020-07-08 DIAGNOSIS — E1122 Type 2 diabetes mellitus with diabetic chronic kidney disease: Secondary | ICD-10-CM | POA: Diagnosis not present

## 2020-07-08 DIAGNOSIS — J9691 Respiratory failure, unspecified with hypoxia: Secondary | ICD-10-CM | POA: Diagnosis not present

## 2020-07-08 DIAGNOSIS — E1136 Type 2 diabetes mellitus with diabetic cataract: Secondary | ICD-10-CM | POA: Diagnosis not present

## 2020-07-12 ENCOUNTER — Telehealth: Payer: Self-pay | Admitting: Family Medicine

## 2020-07-12 DIAGNOSIS — J9691 Respiratory failure, unspecified with hypoxia: Secondary | ICD-10-CM | POA: Diagnosis not present

## 2020-07-12 DIAGNOSIS — E1122 Type 2 diabetes mellitus with diabetic chronic kidney disease: Secondary | ICD-10-CM | POA: Diagnosis not present

## 2020-07-12 DIAGNOSIS — J209 Acute bronchitis, unspecified: Secondary | ICD-10-CM | POA: Diagnosis not present

## 2020-07-12 DIAGNOSIS — U071 COVID-19: Secondary | ICD-10-CM | POA: Diagnosis not present

## 2020-07-12 DIAGNOSIS — I2510 Atherosclerotic heart disease of native coronary artery without angina pectoris: Secondary | ICD-10-CM | POA: Diagnosis not present

## 2020-07-12 DIAGNOSIS — E1136 Type 2 diabetes mellitus with diabetic cataract: Secondary | ICD-10-CM | POA: Diagnosis not present

## 2020-07-12 DIAGNOSIS — J1282 Pneumonia due to coronavirus disease 2019: Secondary | ICD-10-CM | POA: Diagnosis not present

## 2020-07-12 DIAGNOSIS — I129 Hypertensive chronic kidney disease with stage 1 through stage 4 chronic kidney disease, or unspecified chronic kidney disease: Secondary | ICD-10-CM | POA: Diagnosis not present

## 2020-07-12 DIAGNOSIS — N1831 Chronic kidney disease, stage 3a: Secondary | ICD-10-CM | POA: Diagnosis not present

## 2020-07-12 NOTE — Telephone Encounter (Signed)
Pt's wife was notified and he is not nausea so not sending in zofran

## 2020-07-12 NOTE — Telephone Encounter (Signed)
Ok to prescribe him Zofran to dissolve under his tongue. Hopefully this will help and he can get more fluids down. If he is getting worse then he may need to go to the emergency department for IV fluids and an evaluation.

## 2020-07-12 NOTE — Telephone Encounter (Signed)
Please call  Pt wife called Pt started with dry heaves last night around 9 pm Continuing all day today Not gettting anything up, maybe small amount of liquid  No fever No diarrhea Blood sugar  111  Trying just sips of water Wife would like call back to see what else she can do Concerned about him getting dehydrated

## 2020-07-14 ENCOUNTER — Telehealth: Payer: Self-pay | Admitting: Family Medicine

## 2020-07-14 DIAGNOSIS — J449 Chronic obstructive pulmonary disease, unspecified: Secondary | ICD-10-CM | POA: Diagnosis not present

## 2020-07-14 DIAGNOSIS — U071 COVID-19: Secondary | ICD-10-CM | POA: Diagnosis not present

## 2020-07-14 NOTE — Telephone Encounter (Signed)
[facility]endra from [facility] called and is requesting verbal orders to continue PT for 1 time a week for 4 weeks and then 1 time a week for every other week for 2 weeks she can be reached at [phone]

## 2020-07-14 NOTE — Telephone Encounter (Signed)
Left detailed message with Tillie Rung okay verbal orders

## 2020-07-14 NOTE — Telephone Encounter (Signed)
Ok to approve 

## 2020-07-19 DIAGNOSIS — E1136 Type 2 diabetes mellitus with diabetic cataract: Secondary | ICD-10-CM | POA: Diagnosis not present

## 2020-07-19 DIAGNOSIS — M109 Gout, unspecified: Secondary | ICD-10-CM | POA: Diagnosis not present

## 2020-07-19 DIAGNOSIS — I129 Hypertensive chronic kidney disease with stage 1 through stage 4 chronic kidney disease, or unspecified chronic kidney disease: Secondary | ICD-10-CM | POA: Diagnosis not present

## 2020-07-19 DIAGNOSIS — M810 Age-related osteoporosis without current pathological fracture: Secondary | ICD-10-CM | POA: Diagnosis not present

## 2020-07-19 DIAGNOSIS — R1312 Dysphagia, oropharyngeal phase: Secondary | ICD-10-CM | POA: Diagnosis not present

## 2020-07-19 DIAGNOSIS — N1831 Chronic kidney disease, stage 3a: Secondary | ICD-10-CM | POA: Diagnosis not present

## 2020-07-19 DIAGNOSIS — I2510 Atherosclerotic heart disease of native coronary artery without angina pectoris: Secondary | ICD-10-CM | POA: Diagnosis not present

## 2020-07-19 DIAGNOSIS — C679 Malignant neoplasm of bladder, unspecified: Secondary | ICD-10-CM | POA: Diagnosis not present

## 2020-07-19 DIAGNOSIS — E1122 Type 2 diabetes mellitus with diabetic chronic kidney disease: Secondary | ICD-10-CM | POA: Diagnosis not present

## 2020-07-22 ENCOUNTER — Other Ambulatory Visit: Payer: Self-pay

## 2020-07-22 ENCOUNTER — Ambulatory Visit (HOSPITAL_COMMUNITY)
Admission: RE | Admit: 2020-07-22 | Discharge: 2020-07-22 | Disposition: A | Discharge status: Home / Self Care | Payer: Medicare PPO | Source: Ambulatory Visit | Attending: Oncology | Admitting: Oncology

## 2020-07-22 ENCOUNTER — Inpatient Hospital Stay: Payer: Medicare PPO | Attending: Oncology

## 2020-07-22 DIAGNOSIS — I700 Atherosclerosis of aorta: Secondary | ICD-10-CM | POA: Insufficient documentation

## 2020-07-22 DIAGNOSIS — R911 Solitary pulmonary nodule: Secondary | ICD-10-CM | POA: Insufficient documentation

## 2020-07-22 DIAGNOSIS — Z79899 Other long term (current) drug therapy: Secondary | ICD-10-CM | POA: Insufficient documentation

## 2020-07-22 DIAGNOSIS — Z8551 Personal history of malignant neoplasm of bladder: Secondary | ICD-10-CM | POA: Diagnosis not present

## 2020-07-22 DIAGNOSIS — C679 Malignant neoplasm of bladder, unspecified: Secondary | ICD-10-CM | POA: Diagnosis not present

## 2020-07-22 DIAGNOSIS — Z9221 Personal history of antineoplastic chemotherapy: Secondary | ICD-10-CM | POA: Insufficient documentation

## 2020-07-22 DIAGNOSIS — R918 Other nonspecific abnormal finding of lung field: Secondary | ICD-10-CM | POA: Insufficient documentation

## 2020-07-22 DIAGNOSIS — I2510 Atherosclerotic heart disease of native coronary artery without angina pectoris: Secondary | ICD-10-CM | POA: Insufficient documentation

## 2020-07-22 DIAGNOSIS — J439 Emphysema, unspecified: Secondary | ICD-10-CM | POA: Diagnosis not present

## 2020-07-22 LAB — CMP (CANCER CENTER ONLY)
ALT: 17 U/L (ref 0–44)
AST: 28 U/L (ref 15–41)
Albumin: 3.1 g/dL — ABNORMAL LOW (ref 3.5–5.0)
Alkaline Phosphatase: 92 U/L (ref 38–126)
Anion gap: 10 (ref 5–15)
BUN: 24 mg/dL — ABNORMAL HIGH (ref 8–23)
CO2: 21 mmol/L — ABNORMAL LOW (ref 22–32)
Calcium: 9.7 mg/dL (ref 8.9–10.3)
Chloride: 109 mmol/L (ref 98–111)
Creatinine: 1.73 mg/dL — ABNORMAL HIGH (ref 0.61–1.24)
GFR, Est AFR Am: 44 mL/min — ABNORMAL LOW (ref >60)
GFR, Estimated: 38 mL/min — ABNORMAL LOW (ref >60)
Glucose, Bld: 121 mg/dL — ABNORMAL HIGH (ref 70–99)
Potassium: 4.3 mmol/L (ref 3.5–5.1)
Sodium: 140 mmol/L (ref 135–145)
Total Bilirubin: 0.5 mg/dL (ref 0.3–1.2)
Total Protein: 7.5 g/dL (ref 6.5–8.1)

## 2020-07-22 LAB — CBC WITH DIFFERENTIAL (CANCER CENTER ONLY)
Abs Immature Granulocytes: 0.01 10*3/uL (ref 0.00–0.07)
Basophils Absolute: 0 10*3/uL (ref 0.0–0.1)
Basophils Relative: 1 %
Eosinophils Absolute: 0.1 10*3/uL (ref 0.0–0.5)
Eosinophils Relative: 2 %
HCT: 36.2 % — ABNORMAL LOW (ref 39.0–52.0)
Hemoglobin: 11.2 g/dL — ABNORMAL LOW (ref 13.0–17.0)
Immature Granulocytes: 0 %
Lymphocytes Relative: 14 %
Lymphs Abs: 0.6 10*3/uL — ABNORMAL LOW (ref 0.7–4.0)
MCH: 27.5 pg (ref 26.0–34.0)
MCHC: 30.9 g/dL (ref 30.0–36.0)
MCV: 88.7 fL (ref 80.0–100.0)
Monocytes Absolute: 0.3 10*3/uL (ref 0.1–1.0)
Monocytes Relative: 9 %
Neutro Abs: 3 10*3/uL (ref 1.7–7.7)
Neutrophils Relative %: 74 %
Platelet Count: 166 10*3/uL (ref 150–400)
RBC: 4.08 MIL/uL — ABNORMAL LOW (ref 4.22–5.81)
RDW: 19.2 % — ABNORMAL HIGH (ref 11.5–15.5)
WBC Count: 4 10*3/uL (ref 4.0–10.5)
nRBC: 0 % (ref 0.0–0.2)

## 2020-07-22 LAB — GLUCOSE, CAPILLARY: Glucose-Capillary: 119 mg/dL — ABNORMAL HIGH (ref 70–99)

## 2020-07-22 MED ORDER — FLUDEOXYGLUCOSE F - 18 (FDG) INJECTION
11.6200 | Freq: Once | INTRAVENOUS | Status: AC | PRN
  Administered 2020-07-22: 11.62 via INTRAVENOUS

## 2020-07-26 ENCOUNTER — Other Ambulatory Visit: Payer: Self-pay

## 2020-07-26 ENCOUNTER — Other Ambulatory Visit: Payer: Medicare PPO

## 2020-07-26 ENCOUNTER — Inpatient Hospital Stay: Payer: Medicare PPO | Admitting: Oncology

## 2020-07-26 VITALS — BP 125/71 | HR 78 | Temp 98.1°F | Resp 22 | Wt 221.3 lb

## 2020-07-26 DIAGNOSIS — I2510 Atherosclerotic heart disease of native coronary artery without angina pectoris: Secondary | ICD-10-CM | POA: Diagnosis not present

## 2020-07-26 DIAGNOSIS — C679 Malignant neoplasm of bladder, unspecified: Secondary | ICD-10-CM | POA: Diagnosis not present

## 2020-07-26 DIAGNOSIS — R918 Other nonspecific abnormal finding of lung field: Secondary | ICD-10-CM | POA: Diagnosis not present

## 2020-07-26 DIAGNOSIS — Z79899 Other long term (current) drug therapy: Secondary | ICD-10-CM | POA: Diagnosis not present

## 2020-07-26 DIAGNOSIS — I700 Atherosclerosis of aorta: Secondary | ICD-10-CM | POA: Diagnosis not present

## 2020-07-26 DIAGNOSIS — Z9221 Personal history of antineoplastic chemotherapy: Secondary | ICD-10-CM | POA: Diagnosis not present

## 2020-07-26 DIAGNOSIS — J439 Emphysema, unspecified: Secondary | ICD-10-CM | POA: Diagnosis not present

## 2020-07-26 NOTE — Progress Notes (Signed)
Hematology and Oncology Follow Up Visit  Jimmy [name] [id number] February 08, 1946 74 y.o. 07/26/2020 10:10 AM [name], MDLalonde, [name], MD   Principle Diagnosis: 74 year old man with T3aN0 high-grade urothelial carcinoma of the bladder diagnosed in June 2020.  He developed a pulmonary nodules in June 2021.   Prior Therapy: TURBT with right ureteral stent placement as well as left ureteral stent placement at that time.  The final pathology showed infiltrating high-grade papillary adenocarcinoma invading into the detrusor muscle with lymphovascular invasion identified.  This was completed on 06/22/2019.    Neoadjuvant chemotherapy started on August 07, 2019 with cisplatin and gemcitabine started on August 07, 2019.  He completed 4 cycles of chemotherapy on October 16, 2019.  He is status post radical cystectomy and lymph node dissection on December 10, 2019.  The final pathology showed 1.5 cm residual tumor with invasion into the perivesicular soft tissue.  Final pathological stage was T3a and 0 with 0 out of 10 lymph nodes involved.  Current therapy: Under consideration start treatment.  Interim History: Mr. Leung presents today for a return evaluation.  Since the last visit, he reports continuous improvement in his overall health and recovery.  He is ambulating with the help of a cane at home without any recent falls or syncope.  He denies any nausea, vomiting or abdominal pain.  Denies any chest pain or palpitation.  Continues to use oxygen at this time although oxygen levels off of it has been stable.                Medications: Updated on review. Current Outpatient Medications  Medication Sig Dispense Refill  . Accu-Chek Softclix Lancets lancets 1 each by Other route in the morning, at noon, and at bedtime. Use as instructed 300 each 2  . acetaminophen (TYLENOL) 325 MG tablet Take 2 tablets (650 mg total) by mouth every 6 (six) hours as needed for mild pain or headache  (fever >/= 101). 30 tablet 0  . albuterol (VENTOLIN HFA) 108 (90 Base) MCG/ACT inhaler Inhale 2 puffs into the lungs every 4 (four) hours as needed for wheezing or shortness of breath. (Patient not taking: Reported on 04/11/2020) 8 g 0  . apixaban (ELIQUIS) 5 MG TABS tablet Take 1 tablet (5 mg total) by mouth 2 (two) times daily. 180 tablet 1  . Colchicine 0.6 MG CAPS Take 0.6 mg by mouth 2 (two) times daily. 60 capsule 0  . COMBIVENT RESPIMAT 20-100 MCG/ACT AERS respimat USE 1 PUFF 4 TIMES A DAY RINSE MOUTH AFTER USE 4 g 5  . glucose blood test strip 1 each by Other route in the morning, at noon, and at bedtime. Use as instructed 300 each 2  . Insulin Pen Needle 29G X 12MM MISC Use as directed 200 each 0  . Insulin Pen Needle 32G X 4 MM MISC Use for injections twice daily 200 each 1  . LEVEMIR FLEXTOUCH 100 UNIT/ML FlexPen INJECT 16 UNITS TWICE A DAY 15 mL 0  . metFORMIN (GLUCOPHAGE) 1000 MG tablet Take 1,000 mg by mouth daily.     . metoprolol tartrate (LOPRESSOR) 25 MG tablet TAKE 1/2 TABLET TWICE A DAY 90 tablet 1  . Multiple Vitamin (MULTIVITAMIN WITH MINERALS) TABS tablet Take 1 tablet by mouth daily. 30 tablet 0  . pantoprazole (PROTONIX) 40 MG tablet Take 40 mg by mouth daily.    . polyethylene glycol (MIRALAX / GLYCOLAX) 17 g packet Take 17 g by mouth daily as needed for  moderate constipation or severe constipation. 14 each 0  . traMADol (ULTRAM) 50 MG tablet      No current facility-administered medications for this visit.     Allergies:  Allergies  Allergen Reactions  . Ace Inhibitors Cough  . Cephalexin Hives, Diarrhea and Nausea Only    Tolerates Ancef, Rocephin, Cefoxitin  . Crestor [Rosuvastatin Calcium]   . Lipitor [Atorvastatin Calcium] Other (See Comments)    MUSCLE PAINS   . Plavix [Clopidogrel Bisulfate] Hives  . Vancomycin Nausea And Vomiting    Po route caused n/v.        Physical Exam:       ECOG: 2    General appearance: Alert, awake without any  distress. Head: Atraumatic without abnormalities Oropharynx: Without any thrush or ulcers. Eyes: No scleral icterus. Lymph nodes: No lymphadenopathy noted in the cervical, supraclavicular, or axillary nodes Heart:regular rate and rhythm, without any murmurs or gallops.    Bilateral foot edema noted. Lung: Clear to auscultation without any rhonchi, wheezes or dullness to percussion. Abdomin: Soft, nontender without any shifting dullness or ascites. Musculoskeletal: No clubbing or cyanosis. Neurological: No motor or sensory deficits. Skin: No rashes or lesions.         Lab Results: Lab Results  Component Value Date   WBC 4.0 07/22/2020   HGB 11.2 (L) 07/22/2020   HCT 36.2 (L) 07/22/2020   MCV 88.7 07/22/2020   PLT 166 07/22/2020     Chemistry      Component Value Date/Time   NA 140 07/22/2020 1058   NA 143 03/17/2020 1024   K 4.3 07/22/2020 1058   CL 109 07/22/2020 1058   CO2 21 (L) 07/22/2020 1058   BUN 24 (H) 07/22/2020 1058   BUN 13 03/17/2020 1024   CREATININE 1.73 (H) 07/22/2020 1058   CREATININE 0.89 11/19/2016 0920      Component Value Date/Time   CALCIUM 9.7 07/22/2020 1058   ALKPHOS 92 07/22/2020 1058   AST 28 07/22/2020 1058   ALT 17 07/22/2020 1058   BILITOT 0.5 07/22/2020 1058     IMPRESSION: 1. Again seen are 5 suspicious lung nodules involving both lungs. All of these are FDG avid and are suspicious for metastatic disease. Nodules in the posterior left lower lobe and paravertebral left lung base have increased in size in the interval. The other nodules are stable in size. 2. No findings of FDG avid nodal metastasis or solid organ metastasis within the abdomen or pelvis. 3. Aortic Atherosclerosis (ICD10-I70.0) and Emphysema (ICD10-J43.9). Coronary artery calcifications noted.  Impression and Plan:   74 year old man with:  1.  Bladder cancer diagnosed in June 2020.  He was found to have T3aN0 high-grade urothelial carcinoma after surgical  resection.  PET CT scan obtained on 07/22/2020 was personally reviewed and showed bilateral pulmonary nodules that have increased in size and and exhibit FDG avid activity.  Risks and benefits of starting anticancer treatment for a presumed metastatic disease were discussed.  It would be a reasonable candidate for Pembrolizumab given his previous exposure to platinum based regimen.  Complication associated with this treatment include nausea, fatigue and immune mediated complications.  After discussion today he will think about this and let me know in the near future and likely will proceed with treatment.   2. IV access:Peripheral veins will be used for any subsequent treatment.  3.  Pulmonary nodules: Likely related to metastatic urothelial carcinoma.  Obtaining tissue biopsy will be difficult at this time given the location and  the size of these nodules.  4.  Immune mediated complications.  Patient include pneumonitis, colitis, thyroid disease among others.  We will continue to monitor these on immunotherapy.  5. Follow-up:  To be determined pending his decision to start treatment.  30  minutes were spent on this encounter.  The time was dedicated to reviewing imaging studies, discussing treatment options and addressing complications related to therapy.      Zola Button, MD 7/27/202110:10 AM

## 2020-07-26 NOTE — Progress Notes (Signed)
DISCONTINUE ON PATHWAY REGIMEN - Bladder     A cycle is every 21 days:     Gemcitabine      Cisplatin   **Always confirm dose/schedule in your pharmacy ordering system**  REASON: Disease Progression PRIOR TREATMENT: [id number]: Gemcitabine 1,000 mg/m2 D1, 8 + Cisplatin 70 mg/m2 D1 q21 Days x 4 Cycles TREATMENT RESPONSE: Complete Response (CR)  START ON PATHWAY REGIMEN - Bladder     A cycle is 21 days:     Pembrolizumab   **Always confirm dose/schedule in your pharmacy ordering system**  Patient Characteristics: Advanced/Metastatic Disease, First Line, Prior Platinum-Based Therapy, Relapse Within 12 Months, FGFR2/FGFR3 Mutation Negative Unknown, Candidate for PD-1/PD-L1 Inhibitor Therapeutic Status: Advanced/Metastatic Disease Line of Therapy: First Line Prior Platinum-Based Therapy<= Yes Time to Relapse: Relapse Within 12 Months FGFR2/FGFR3 Mutation Status: Did Not Order Test PD-1/PD-L1 Inhibitor Candidate Status: Candidate for PD-1/PD-L1 Inhibitor Intent of Therapy: Non-Curative / Palliative Intent, Discussed with Patient

## 2020-07-27 DIAGNOSIS — N1831 Chronic kidney disease, stage 3a: Secondary | ICD-10-CM | POA: Diagnosis not present

## 2020-07-27 DIAGNOSIS — E1136 Type 2 diabetes mellitus with diabetic cataract: Secondary | ICD-10-CM | POA: Diagnosis not present

## 2020-07-27 DIAGNOSIS — E1122 Type 2 diabetes mellitus with diabetic chronic kidney disease: Secondary | ICD-10-CM | POA: Diagnosis not present

## 2020-07-27 DIAGNOSIS — I2510 Atherosclerotic heart disease of native coronary artery without angina pectoris: Secondary | ICD-10-CM | POA: Diagnosis not present

## 2020-07-27 DIAGNOSIS — C679 Malignant neoplasm of bladder, unspecified: Secondary | ICD-10-CM | POA: Diagnosis not present

## 2020-07-27 DIAGNOSIS — M810 Age-related osteoporosis without current pathological fracture: Secondary | ICD-10-CM | POA: Diagnosis not present

## 2020-07-27 DIAGNOSIS — I129 Hypertensive chronic kidney disease with stage 1 through stage 4 chronic kidney disease, or unspecified chronic kidney disease: Secondary | ICD-10-CM | POA: Diagnosis not present

## 2020-07-27 DIAGNOSIS — R1312 Dysphagia, oropharyngeal phase: Secondary | ICD-10-CM | POA: Diagnosis not present

## 2020-07-27 DIAGNOSIS — M109 Gout, unspecified: Secondary | ICD-10-CM | POA: Diagnosis not present

## 2020-07-28 ENCOUNTER — Ambulatory Visit: Payer: Medicare PPO | Admitting: Family Medicine

## 2020-07-28 ENCOUNTER — Other Ambulatory Visit: Payer: Self-pay

## 2020-07-28 VITALS — BP 110/66 | HR 71 | Temp 99.0°F | Wt 221.4 lb

## 2020-07-28 DIAGNOSIS — M109 Gout, unspecified: Secondary | ICD-10-CM | POA: Diagnosis not present

## 2020-07-28 DIAGNOSIS — C679 Malignant neoplasm of bladder, unspecified: Secondary | ICD-10-CM | POA: Diagnosis not present

## 2020-07-28 DIAGNOSIS — J449 Chronic obstructive pulmonary disease, unspecified: Secondary | ICD-10-CM

## 2020-07-28 DIAGNOSIS — Z8616 Personal history of COVID-19: Secondary | ICD-10-CM

## 2020-07-28 DIAGNOSIS — E118 Type 2 diabetes mellitus with unspecified complications: Secondary | ICD-10-CM

## 2020-07-28 DIAGNOSIS — J4489 Other specified chronic obstructive pulmonary disease: Secondary | ICD-10-CM

## 2020-07-28 DIAGNOSIS — M25531 Pain in right wrist: Secondary | ICD-10-CM | POA: Diagnosis not present

## 2020-07-28 NOTE — Patient Instructions (Addendum)
Try stopping the oxygen and keep his pulse ox 92 or above.  It might be that he needs it mainly with physical activity and I will put that at 1 L a minute Cut the insulin back to 16 units a day and stay on your other medicines.  Check them either before a meal or 2 hours after meal.  The sugars might go up as long as it going to the 300 range

## 2020-07-28 NOTE — Progress Notes (Signed)
   Subjective:    Patient ID: Jimmy [name], male    DOB: Jun 07, 1946, 74 y.o.   MRN: [id number]  HPI He is here for medication management visit.  He continues to be followed by oncology for his bladder cancer and now has evidence of metastatic disease.  He continues on his diabetes medicines and is presently on Levemir 16 twice daily.  Apparently his blood sugars are never below the 100s.  He continues on oxygen therapy but according to his wife his numbers remain good except him when he becomes physically active and they do drop below 90 on occasion.  He has had a month long history of right wrist pain which is interfering with his mobility.  He does have a history of gout but when he was on colchicine, the wrist pain did not respond to that.   Review of Systems     Objective:   Physical Exam Alert and in no distress.  Pulse ox on room air is 92.  Hemoglobin A1c is 5.6.       Assessment & Plan:  Type II diabetes mellitus with complication ([place]) - Plan: POCT glycosylated hemoglobin (Hb A1C)  Asthma with irreversible airway obstruction, unspecified asthma severity, unspecified whether complicated, unspecified whether persistent (HCC)  Malignant neoplasm of urinary bladder, unspecified site [facility])  Right wrist pain - Plan: DG Wrist Complete Right  History of COVID-19  Gout, unspecified cause, unspecified chronicity, unspecified site I will cut his insulin dosing in half to 16 units daily and continue Metformin.  They will monitor his blood sugars carefully.  I explained that his A1c was lower than I would like to see with someone on insulin. Recommend stopping the oxygen but monitor to keep his pulse ox above 92.  Suspect that he might need it with physical activity and also at night.  His wife will monitor this.  If so we will lower the liters per minute to 1 to keep his pulse ox above 92.  She was comfortable with watching this and will keep me informed. Follow-up on the wrist pain  when the x-ray is done. No further intervention concerning the gout. He will continue to be followed by oncology.

## 2020-07-29 ENCOUNTER — Encounter (HOSPITAL_COMMUNITY): Payer: Self-pay | Admitting: Emergency Medicine

## 2020-07-29 ENCOUNTER — Emergency Department (HOSPITAL_COMMUNITY): Payer: Medicare PPO

## 2020-07-29 ENCOUNTER — Inpatient Hospital Stay (HOSPITAL_COMMUNITY)
Admission: EM | Admit: 2020-07-29 | Discharge: 2020-08-02 | DRG: 871 | Disposition: A | Discharge status: Home / Self Care | Payer: Medicare PPO | Attending: Internal Medicine | Admitting: Internal Medicine

## 2020-07-29 ENCOUNTER — Telehealth: Payer: Self-pay | Admitting: Oncology

## 2020-07-29 ENCOUNTER — Telehealth: Payer: Self-pay

## 2020-07-29 ENCOUNTER — Other Ambulatory Visit: Payer: Self-pay | Admitting: Oncology

## 2020-07-29 DIAGNOSIS — Z888 Allergy status to other drugs, medicaments and biological substances status: Secondary | ICD-10-CM

## 2020-07-29 DIAGNOSIS — I10 Essential (primary) hypertension: Secondary | ICD-10-CM | POA: Diagnosis present

## 2020-07-29 DIAGNOSIS — J9601 Acute respiratory failure with hypoxia: Secondary | ICD-10-CM

## 2020-07-29 DIAGNOSIS — D6959 Other secondary thrombocytopenia: Secondary | ICD-10-CM | POA: Diagnosis present

## 2020-07-29 DIAGNOSIS — N289 Disorder of kidney and ureter, unspecified: Secondary | ICD-10-CM | POA: Diagnosis not present

## 2020-07-29 DIAGNOSIS — L8990 Pressure ulcer of unspecified site, unspecified stage: Secondary | ICD-10-CM | POA: Insufficient documentation

## 2020-07-29 DIAGNOSIS — J439 Emphysema, unspecified: Secondary | ICD-10-CM | POA: Diagnosis present

## 2020-07-29 DIAGNOSIS — R6521 Severe sepsis with septic shock: Secondary | ICD-10-CM | POA: Diagnosis present

## 2020-07-29 DIAGNOSIS — G4730 Sleep apnea, unspecified: Secondary | ICD-10-CM | POA: Diagnosis present

## 2020-07-29 DIAGNOSIS — Z66 Do not resuscitate: Secondary | ICD-10-CM | POA: Diagnosis present

## 2020-07-29 DIAGNOSIS — A4159 Other Gram-negative sepsis: Secondary | ICD-10-CM | POA: Diagnosis not present

## 2020-07-29 DIAGNOSIS — C679 Malignant neoplasm of bladder, unspecified: Secondary | ICD-10-CM | POA: Diagnosis present

## 2020-07-29 DIAGNOSIS — Z825 Family history of asthma and other chronic lower respiratory diseases: Secondary | ICD-10-CM

## 2020-07-29 DIAGNOSIS — M1990 Unspecified osteoarthritis, unspecified site: Secondary | ICD-10-CM | POA: Diagnosis present

## 2020-07-29 DIAGNOSIS — Z87442 Personal history of urinary calculi: Secondary | ICD-10-CM

## 2020-07-29 DIAGNOSIS — I152 Hypertension secondary to endocrine disorders: Secondary | ICD-10-CM | POA: Diagnosis present

## 2020-07-29 DIAGNOSIS — A419 Sepsis, unspecified organism: Secondary | ICD-10-CM | POA: Diagnosis present

## 2020-07-29 DIAGNOSIS — E1169 Type 2 diabetes mellitus with other specified complication: Secondary | ICD-10-CM | POA: Diagnosis present

## 2020-07-29 DIAGNOSIS — C7911 Secondary malignant neoplasm of bladder: Secondary | ICD-10-CM | POA: Diagnosis present

## 2020-07-29 DIAGNOSIS — Z79891 Long term (current) use of opiate analgesic: Secondary | ICD-10-CM

## 2020-07-29 DIAGNOSIS — D696 Thrombocytopenia, unspecified: Secondary | ICD-10-CM

## 2020-07-29 DIAGNOSIS — D649 Anemia, unspecified: Secondary | ICD-10-CM | POA: Diagnosis not present

## 2020-07-29 DIAGNOSIS — M818 Other osteoporosis without current pathological fracture: Secondary | ICD-10-CM | POA: Diagnosis present

## 2020-07-29 DIAGNOSIS — G4733 Obstructive sleep apnea (adult) (pediatric): Secondary | ICD-10-CM | POA: Diagnosis present

## 2020-07-29 DIAGNOSIS — Z881 Allergy status to other antibiotic agents status: Secondary | ICD-10-CM

## 2020-07-29 DIAGNOSIS — R61 Generalized hyperhidrosis: Secondary | ICD-10-CM | POA: Diagnosis present

## 2020-07-29 DIAGNOSIS — Z87891 Personal history of nicotine dependence: Secondary | ICD-10-CM

## 2020-07-29 DIAGNOSIS — E1159 Type 2 diabetes mellitus with other circulatory complications: Secondary | ICD-10-CM | POA: Diagnosis present

## 2020-07-29 DIAGNOSIS — B961 Klebsiella pneumoniae [K. pneumoniae] as the cause of diseases classified elsewhere: Secondary | ICD-10-CM | POA: Diagnosis present

## 2020-07-29 DIAGNOSIS — N390 Urinary tract infection, site not specified: Secondary | ICD-10-CM | POA: Diagnosis present

## 2020-07-29 DIAGNOSIS — Z95828 Presence of other vascular implants and grafts: Secondary | ICD-10-CM

## 2020-07-29 DIAGNOSIS — R0689 Other abnormalities of breathing: Secondary | ICD-10-CM | POA: Diagnosis not present

## 2020-07-29 DIAGNOSIS — Z8546 Personal history of malignant neoplasm of prostate: Secondary | ICD-10-CM

## 2020-07-29 DIAGNOSIS — Z794 Long term (current) use of insulin: Secondary | ICD-10-CM

## 2020-07-29 DIAGNOSIS — Z7901 Long term (current) use of anticoagulants: Secondary | ICD-10-CM

## 2020-07-29 DIAGNOSIS — R000 Tachycardia, unspecified: Secondary | ICD-10-CM | POA: Diagnosis not present

## 2020-07-29 DIAGNOSIS — E118 Type 2 diabetes mellitus with unspecified complications: Secondary | ICD-10-CM | POA: Diagnosis present

## 2020-07-29 DIAGNOSIS — Z79899 Other long term (current) drug therapy: Secondary | ICD-10-CM

## 2020-07-29 DIAGNOSIS — R0682 Tachypnea, not elsewhere classified: Secondary | ICD-10-CM | POA: Diagnosis not present

## 2020-07-29 DIAGNOSIS — R0902 Hypoxemia: Secondary | ICD-10-CM | POA: Diagnosis not present

## 2020-07-29 DIAGNOSIS — Z6833 Body mass index (BMI) 33.0-33.9, adult: Secondary | ICD-10-CM

## 2020-07-29 DIAGNOSIS — Z8616 Personal history of COVID-19: Secondary | ICD-10-CM

## 2020-07-29 DIAGNOSIS — J470 Bronchiectasis with acute lower respiratory infection: Secondary | ICD-10-CM | POA: Diagnosis present

## 2020-07-29 DIAGNOSIS — Z86718 Personal history of other venous thrombosis and embolism: Secondary | ICD-10-CM

## 2020-07-29 DIAGNOSIS — R509 Fever, unspecified: Secondary | ICD-10-CM | POA: Diagnosis not present

## 2020-07-29 DIAGNOSIS — R52 Pain, unspecified: Secondary | ICD-10-CM

## 2020-07-29 DIAGNOSIS — E7800 Pure hypercholesterolemia, unspecified: Secondary | ICD-10-CM | POA: Diagnosis present

## 2020-07-29 DIAGNOSIS — E559 Vitamin D deficiency, unspecified: Secondary | ICD-10-CM | POA: Diagnosis present

## 2020-07-29 DIAGNOSIS — E785 Hyperlipidemia, unspecified: Secondary | ICD-10-CM | POA: Diagnosis present

## 2020-07-29 DIAGNOSIS — I2510 Atherosclerotic heart disease of native coronary artery without angina pectoris: Secondary | ICD-10-CM | POA: Diagnosis present

## 2020-07-29 DIAGNOSIS — J9621 Acute and chronic respiratory failure with hypoxia: Secondary | ICD-10-CM | POA: Diagnosis present

## 2020-07-29 DIAGNOSIS — M25531 Pain in right wrist: Secondary | ICD-10-CM | POA: Diagnosis present

## 2020-07-29 DIAGNOSIS — J438 Other emphysema: Secondary | ICD-10-CM | POA: Diagnosis present

## 2020-07-29 DIAGNOSIS — E872 Acidosis: Secondary | ICD-10-CM | POA: Diagnosis present

## 2020-07-29 DIAGNOSIS — N179 Acute kidney failure, unspecified: Secondary | ICD-10-CM | POA: Diagnosis present

## 2020-07-29 DIAGNOSIS — C7800 Secondary malignant neoplasm of unspecified lung: Secondary | ICD-10-CM | POA: Diagnosis present

## 2020-07-29 DIAGNOSIS — J189 Pneumonia, unspecified organism: Secondary | ICD-10-CM | POA: Diagnosis present

## 2020-07-29 DIAGNOSIS — K760 Fatty (change of) liver, not elsewhere classified: Secondary | ICD-10-CM | POA: Diagnosis present

## 2020-07-29 DIAGNOSIS — Z20822 Contact with and (suspected) exposure to covid-19: Secondary | ICD-10-CM | POA: Diagnosis present

## 2020-07-29 DIAGNOSIS — J432 Centrilobular emphysema: Secondary | ICD-10-CM | POA: Diagnosis present

## 2020-07-29 DIAGNOSIS — H9190 Unspecified hearing loss, unspecified ear: Secondary | ICD-10-CM | POA: Diagnosis present

## 2020-07-29 MED ORDER — LACTATED RINGERS IV SOLN: INTRAVENOUS | Status: DC

## 2020-07-29 MED ORDER — VANCOMYCIN HCL IN DEXTROSE 1-5 GM/200ML-% IV SOLN: 1000.0000 mg | Freq: Once | INTRAVENOUS | Status: DC

## 2020-07-29 MED ORDER — LACTATED RINGERS IV BOLUS (SEPSIS)
500.0000 mL | Freq: Once | INTRAVENOUS | Status: AC
  Administered 2020-07-30: 500 mL via INTRAVENOUS

## 2020-07-29 MED ORDER — VANCOMYCIN HCL 2000 MG/400ML IV SOLN
2000.0000 mg | Freq: Once | INTRAVENOUS | Status: AC
  Administered 2020-07-30: 2000 mg via INTRAVENOUS
  Filled 2020-07-29: qty 400

## 2020-07-29 MED ORDER — LACTATED RINGERS IV BOLUS (SEPSIS)
1000.0000 mL | Freq: Once | INTRAVENOUS | Status: AC
  Administered 2020-07-30: 1000 mL via INTRAVENOUS

## 2020-07-29 MED ORDER — SODIUM CHLORIDE 0.9 % IV SOLN: 2.0000 g | Freq: Once | INTRAVENOUS | Status: DC

## 2020-07-29 MED ORDER — SODIUM CHLORIDE 0.9 % IV SOLN
2.0000 g | Freq: Once | INTRAVENOUS | Status: AC
  Administered 2020-07-30: 2 g via INTRAVENOUS
  Filled 2020-07-29: qty 2

## 2020-07-29 MED ORDER — METRONIDAZOLE IN NACL 5-0.79 MG/ML-% IV SOLN
500.0000 mg | Freq: Once | INTRAVENOUS | Status: AC
  Administered 2020-07-30: 500 mg via INTRAVENOUS
  Filled 2020-07-29: qty 100

## 2020-07-29 NOTE — ED Triage Notes (Signed)
BIB EMS from home. Per wife patient had suddenly developed a "fever" just PTA. Denies any other symptoms. Patient unable to answer questions appropriately. Cancer patient.

## 2020-07-29 NOTE — Telephone Encounter (Signed)
TC from Pt's wife stating that the  Pt has agreed to do Immunotherapy. Pt's wife stated that she would like to get the appointment date and time before Pt changes his mind. Dr. Alen Blew informed. Pt. Scheduled for infusion.

## 2020-07-29 NOTE — Telephone Encounter (Signed)
Scheduled appt per 7/30 sch msg - pt wife is aware of appt date and time

## 2020-07-29 NOTE — ED Provider Notes (Addendum)
[facility] EMERGENCY DEPARTMENT Provider Note   CSN: [id number] Arrival date & time: 07/29/20  2246   History Chief Complaint  Patient presents with  . Fever    Jimmy [name] is a 74 y.o. male.  The history is provided by the patient and the EMS personnel. The history is limited by the condition of the patient (Altered mental status).  Fever He has history of hypertension, diabetes, hyperlipidemia, coronary artery disease, bladder cancer status post ileal conduit and comes in because of fever.  He is reported to have developed a fever tonight, but is not known how high it was at home.  Patient was not aware that he was running a fever.  He denies headache, sore throat, cough, nausea, vomiting, diarrhea.  There have been no known sick contacts.  He has not received the COVID-19 vaccination series, but did have COVID-19 infection in February.  Past Medical History:  Diagnosis Date  . Astigmatism of both eyes 11/01/2016  . Bronchitis   . Coronary artery disease   . Cortical age-related cataract of both eyes 11/01/2016  . Diabetes mellitus without complication ([place])    type 2  . Drug-induced osteoporosis   . Dyslipidemia   . Fatigue   . Fatty liver   . History of kidney stones   . Hypercholesteremia   . Hypogonadism male   . 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 Normal nuclear stress test March 2011  . Nuclear sclerotic cataract of both eyes 11/01/2016  . Obesity   . Prostate cancer [facility])    s/p seed implant and radiation  . Sleep apnea    90% of time dosent wear his cpap per pt  . Vitamin D insufficiency     Patient Active Problem List   Diagnosis Date Noted  . Pulmonary emphysema ([place]) 05/11/2020  . Asthma with irreversible airway obstruction ([place]) 03/17/2020  . MSSA bacteremia 01/26/2020  . Bladder cancer ([place]) 12/10/2019  . Port-A-Cath in place 08/07/2019   . Goals of care, counseling/discussion 07/24/2019  . Malignant neoplasm of urinary bladder ([place]) 07/24/2019  . Senile purpura ([place]) 06/03/2019  . Solitary pulmonary nodule present on computed tomography of lung 04/02/2018  . Type II diabetes mellitus with complication ([place]) 58/85/0277  . Osteoporosis due to androgen therapy 09/16/2012  . Coronary artery disease involving native coronary artery of native heart without angina pectoris 08/29/2011  . Morbid obesity ([place]) 08/29/2011  . History of prostate cancer 08/29/2011  . Sleep apnea 08/29/2011  . Hypertension associated with type 2 diabetes mellitus ([place]) 08/29/2011  . Hyperlipidemia associated with type 2 diabetes mellitus ([place]) 08/29/2011    Past Surgical History:  Procedure Laterality Date  . BUBBLE STUDY  12/30/2019   Procedure: BUBBLE STUDY;  Surgeon: Pixie Casino, MD;  Location: [facility];  Service: Cardiovascular;;  . CARDIAC CATHETERIZATION  April 2000  . IR IMAGING GUIDED PORT INSERTION  07/30/2019  . IR REMOVAL TUN ACCESS W/ PORT W/O FL MOD SED  12/26/2019  . RADIOACTIVE SEED IMPLANT  2009   Implanted radiation seeds  . TEE WITHOUT CARDIOVERSION N/A 12/30/2019   Procedure: TRANSESOPHAGEAL ECHOCARDIOGRAM (TEE);  Surgeon: Pixie Casino, MD;  Location: [facility] ENDOSCOPY;  Service: Cardiovascular;  Laterality: N/A;  . thumb surgery     as a teen caught thumb in machine  . TRANSURETHRAL RESECTION OF BLADDER TUMOR N/A 06/22/2019  Procedure: TRANSURETHRAL RESECTION OF BLADDER TUMOR (TURBT), CYSTOSCOPY,  BILATERAL RETROGRADE PYELOGRAPHY, BILATERAL STENT PLACEMENT;  Surgeon: Raynelle Bring, MD;  Location: [facility] ORS;  Service: Urology;  Laterality: N/A;  GENERAL WITH PARALYSIS       Family History  Problem Relation Age of Onset  . COPD Father     Social History   Tobacco Use  . Smoking status: Former Smoker    Packs/day: 1.00    Years: 40.00    Pack years: 40.00    Types: Cigarettes    Quit date: 08/20/1999    Years  since quitting: 20.9  . Smokeless tobacco: Never Used  Vaping Use  . Vaping Use: Never used  Substance Use Topics  . Alcohol use: Yes    Alcohol/week: 1.0 standard drink    Types: 1 Shots of liquor per week    Comment: occasional  . Drug use: No    Home Medications Prior to Admission medications   Medication Sig Start Date End Date Taking? Authorizing Provider  Accu-Chek Softclix Lancets lancets 1 each by Other route in the morning, at noon, and at bedtime. Use as instructed 03/24/20   [name], MD  acetaminophen (TYLENOL) 325 MG tablet Take 2 tablets (650 mg total) by mouth every 6 (six) hours as needed for mild pain or headache (fever >/= 101). 02/21/20   Allie Bossier, MD  apixaban (ELIQUIS) 5 MG TABS tablet Take 1 tablet (5 mg total) by mouth 2 (two) times daily. 04/12/20   [name], MD  Colchicine 0.6 MG CAPS Take 0.6 mg by mouth 2 (two) times daily. Patient not taking: Reported on 07/28/2020 06/24/20   [name], MD  COMBIVENT RESPIMAT 20-100 MCG/ACT AERS respimat USE 1 PUFF 4 TIMES A DAY RINSE MOUTH AFTER USE 05/12/20   [name], MD  glucose blood test strip 1 each by Other route in the morning, at noon, and at bedtime. Use as instructed 03/24/20   [name], MD  Insulin Pen Needle 32G X 4 MM MISC Use for injections twice daily 04/22/20   [name], MD  LEVEMIR FLEXTOUCH 100 UNIT/ML FlexPen INJECT 16 UNITS TWICE A DAY 05/26/20   [name], MD  metFORMIN (GLUCOPHAGE) 1000 MG tablet Take 1,000 mg by mouth daily.     [provider]  metoprolol tartrate (LOPRESSOR) 25 MG tablet TAKE 1/2 TABLET TWICE A DAY 04/15/20   [name], MD  Multiple Vitamin (MULTIVITAMIN WITH MINERALS) TABS tablet Take 1 tablet by mouth daily. 02/21/20   Allie Bossier, MD  pantoprazole (PROTONIX) 40 MG tablet Take 40 mg by mouth daily.    [provider]  polyethylene glycol (MIRALAX / GLYCOLAX) 17 g packet Take 17 g by mouth daily as needed for  moderate constipation or severe constipation. 02/21/20   Allie Bossier, MD  traMADol Veatrice Bourbon) 50 MG tablet  05/04/20   [provider]    Allergies    Ace inhibitors, Cephalexin, Crestor [rosuvastatin calcium], Lipitor [atorvastatin calcium], Plavix [clopidogrel bisulfate], and Vancomycin  Review of Systems   Review of Systems  Unable to perform ROS: Mental status change  Constitutional: Positive for fever.    Physical Exam Updated Vital Signs BP (!) 145/75 (BP Location: Right Arm)   Pulse (!) 123   Temp (!) 102.9 F (39.4 C) (Oral)   Resp (!) 26   Ht 5\' 8"  (1.727 m)   Wt (!) 100.4 kg   SpO2 97%  BMI 33.65 kg/m   Physical Exam Vitals and nursing note reviewed.   74 year old male, resting comfortably and in no acute distress. Vital signs are significant for rapid heart rate, respiratory rate, elevated temperature, elevated blood pressure. Oxygen saturation is 97%, which is normal.  He is awake and alert and nontoxic in appearance. Head is normocephalic and atraumatic. PERRLA, EOMI. Oropharynx is clear. Neck is nontender and supple without adenopathy or JVD. Back is nontender and there is no CVA tenderness. Lungs have bibasilar rales more prominent on the right, few expiratory wheezes in the left upper lobe.there are no rhonchi. Chest is nontender. Heart has regular rate and rhythm without murmur. Abdomen is soft, flat, nontender without masses or hepatosplenomegaly and peristalsis is normoactive.  Ileal conduit present in the right lower quadrant draining slightly cloudy urine. Extremities have 3+ edema.  Left calf is 2 cm greater in circumference than right calf. Skin is warm and dry without rash. Neurologic: Awake and alert, oriented to person and place but only partly oriented to time, cranial nerves are intact, there are no motor or sensory deficits.  ED Results / Procedures / Treatments   Labs (all labs ordered are listed, but only abnormal results are  displayed) Labs Reviewed  COMPREHENSIVE METABOLIC PANEL - Abnormal; Notable for the following components:      Result Value   CO2 20 (*)    Glucose, Bld 193 (*)    BUN 25 (*)    Creatinine, Ser 1.63 (*)    Albumin 3.1 (*)    AST 42 (*)    GFR calc non Af Amer 41 (*)    GFR calc Af Amer 47 (*)    All other components within normal limits  CBC WITH DIFFERENTIAL/PLATELET - Abnormal; Notable for the following components:   RBC 4.17 (*)    Hemoglobin 11.2 (*)    HCT 37.0 (*)    RDW 19.6 (*)    Platelets 148 (*)    Lymphs Abs 0.3 (*)    All other components within normal limits  PROTIME-INR - Abnormal; Notable for the following components:   Prothrombin Time 15.6 (*)    INR 1.3 (*)    All other components within normal limits  URINALYSIS, ROUTINE W REFLEX MICROSCOPIC - Abnormal; Notable for the following components:   APPearance CLOUDY (*)    Hgb urine dipstick MODERATE (*)    Leukocytes,Ua LARGE (*)    WBC, UA >50 (*)    Bacteria, UA MANY (*)    All other components within normal limits  MAGNESIUM - Abnormal; Notable for the following components:   Magnesium 1.5 (*)    All other components within normal limits  CULTURE, BLOOD (ROUTINE X 2)  CULTURE, BLOOD (ROUTINE X 2)  URINE CULTURE  SARS CORONAVIRUS 2 BY RT PCR (HOSPITAL ORDER, [facility])  APTT  LACTIC ACID, PLASMA  LACTIC ACID, PLASMA    EKG EKG Interpretation  Date/Time:  Saturday July 30 2020 00:50:02 EDT Ventricular Rate:  134 PR Interval:    QRS Duration: 82 QT Interval:  400 QTC Calculation: 598 R Axis:   -2 Text Interpretation: Sinus or ectopic atrial tachycardia Borderline low voltage, extremity leads Prolonged QT interval When compared with ECG of 02/08/2020, QT has lengthened Confirmed by Delora Fuel ([id number]) on 07/30/2020 12:52:55 AM   Radiology DG Chest Portable 1 View  Result Date: 07/30/2020 CLINICAL DATA:  Fever EXAM: PORTABLE CHEST 1 VIEW COMPARISON:  February 10, 2020  FINDINGS: The heart size and mediastinal contours are within normal limits. Again noted are diffuse interstitial coarsened opacities throughout both lungs, there is slight interval worsening within the right lower lung and left perihilar region. No pleural effusion is seen. No acute osseous abnormality. IMPRESSION: Diffusely increased interstitial marking which could be due to chronic lung changes with superimposed infectious etiology Electronically Signed   By: Prudencio Pair M.D.   On: 07/30/2020 00:14    Procedures Procedures  CRITICAL CARE Performed by: Delora Fuel Total critical care time: 50 minutes Critical care time was exclusive of separately billable procedures and treating other patients. Critical care was necessary to treat or prevent imminent or life-threatening deterioration. Critical care was time spent personally by me on the following activities: development of treatment plan with patient and/or surrogate as well as nursing, discussions with consultants, evaluation of patient's response to treatment, examination of patient, obtaining history from patient or surrogate, ordering and performing treatments and interventions, ordering and review of laboratory studies, ordering and review of radiographic studies, pulse oximetry and re-evaluation of patient's condition.  Medications Ordered in ED Medications  lactated ringers infusion (has no administration in time range)  lactated ringers bolus 1,000 mL (has no administration in time range)    And  lactated ringers bolus 1,000 mL (has no administration in time range)    And  lactated ringers bolus 1,000 mL (has no administration in time range)    And  lactated ringers bolus 500 mL (has no administration in time range)  metroNIDAZOLE (FLAGYL) IVPB 500 mg (has no administration in time range)  vancomycin (VANCOREADY) IVPB 2000 mg/400 mL (has no administration in time range)  magnesium sulfate IVPB 2 g 50 mL (has no administration in  time range)  acetaminophen (TYLENOL) tablet 650 mg (has no administration in time range)  ceFEPIme (MAXIPIME) 2 g in sodium chloride 0.9 % 100 mL IVPB (2 g Intravenous New Bag/Given 07/30/20 0046)    ED Course  I have reviewed the triage vital signs and the nursing notes.  Pertinent labs & imaging results that were available during my care of the patient were reviewed by me and considered in my medical decision making (see chart for details).  MDM Rules/Calculators/A&P Fever with presence of SIRS criteria consistent with sepsis.  No hypotension.  Sepsis pathway is initiated including initiation of antibiotics.  Most likely source is urinary tract infection from ileal conduit.  Old records are reviewed, and WBC on 7/23 was 4.0, and he has not received chemotherapy since then.  WBC is normal but with left shift.  Renal insufficiency is present somewhat improved compared with 1 week ago, but creatinine still elevated compared with 3/18.  Mild thrombocytopenia is present and not felt to be clinically significant.  Chest x-ray shows increased interstitial markings but no focal infiltrate.  ECG has significantly prolonged QT interval.  Will check magnesium level and supplement if needed.  Anemia is present and unchanged.  Urinalysis is still pending, and that is still felt to be the most likely source.  Case is discussed with Dr. Claria Dice of [facility], who agrees to admit the patient.  Magnesium level has come back low.  He will be given a dose of magnesium sulfate intravenously.  Final Clinical Impression(s) / ED Diagnoses Final diagnoses:  Sepsis due to undetermined organism ([place])  Renal insufficiency  Normochromic normocytic anemia  Thrombocytopenia ([facility])  Hypomagnesemia    Rx / DC Orders ED Discharge Orders    None  Delora Fuel, MD 23/41/44 [id number]    Delora Fuel, MD 65/80/06 [phone]

## 2020-07-30 ENCOUNTER — Other Ambulatory Visit: Payer: Self-pay

## 2020-07-30 ENCOUNTER — Inpatient Hospital Stay (HOSPITAL_COMMUNITY): Payer: Medicare PPO

## 2020-07-30 ENCOUNTER — Emergency Department (HOSPITAL_COMMUNITY): Payer: Medicare PPO

## 2020-07-30 DIAGNOSIS — C670 Malignant neoplasm of trigone of bladder: Secondary | ICD-10-CM | POA: Diagnosis not present

## 2020-07-30 DIAGNOSIS — R609 Edema, unspecified: Secondary | ICD-10-CM | POA: Diagnosis not present

## 2020-07-30 DIAGNOSIS — E1169 Type 2 diabetes mellitus with other specified complication: Secondary | ICD-10-CM

## 2020-07-30 DIAGNOSIS — E1159 Type 2 diabetes mellitus with other circulatory complications: Secondary | ICD-10-CM | POA: Diagnosis not present

## 2020-07-30 DIAGNOSIS — N390 Urinary tract infection, site not specified: Secondary | ICD-10-CM | POA: Diagnosis not present

## 2020-07-30 DIAGNOSIS — M25731 Osteophyte, right wrist: Secondary | ICD-10-CM | POA: Diagnosis not present

## 2020-07-30 DIAGNOSIS — Z87442 Personal history of urinary calculi: Secondary | ICD-10-CM | POA: Diagnosis not present

## 2020-07-30 DIAGNOSIS — Z8616 Personal history of COVID-19: Secondary | ICD-10-CM | POA: Diagnosis not present

## 2020-07-30 DIAGNOSIS — M19041 Primary osteoarthritis, right hand: Secondary | ICD-10-CM | POA: Diagnosis not present

## 2020-07-30 DIAGNOSIS — D649 Anemia, unspecified: Secondary | ICD-10-CM | POA: Diagnosis not present

## 2020-07-30 DIAGNOSIS — Z8546 Personal history of malignant neoplasm of prostate: Secondary | ICD-10-CM | POA: Diagnosis not present

## 2020-07-30 DIAGNOSIS — R6521 Severe sepsis with septic shock: Secondary | ICD-10-CM

## 2020-07-30 DIAGNOSIS — A4159 Other Gram-negative sepsis: Secondary | ICD-10-CM | POA: Diagnosis not present

## 2020-07-30 DIAGNOSIS — M25831 Other specified joint disorders, right wrist: Secondary | ICD-10-CM | POA: Diagnosis not present

## 2020-07-30 DIAGNOSIS — I10 Essential (primary) hypertension: Secondary | ICD-10-CM

## 2020-07-30 DIAGNOSIS — K760 Fatty (change of) liver, not elsewhere classified: Secondary | ICD-10-CM | POA: Diagnosis present

## 2020-07-30 DIAGNOSIS — I2510 Atherosclerotic heart disease of native coronary artery without angina pectoris: Secondary | ICD-10-CM

## 2020-07-30 DIAGNOSIS — R6520 Severe sepsis without septic shock: Secondary | ICD-10-CM | POA: Diagnosis not present

## 2020-07-30 DIAGNOSIS — C7800 Secondary malignant neoplasm of unspecified lung: Secondary | ICD-10-CM | POA: Diagnosis present

## 2020-07-30 DIAGNOSIS — E785 Hyperlipidemia, unspecified: Secondary | ICD-10-CM

## 2020-07-30 DIAGNOSIS — E7800 Pure hypercholesterolemia, unspecified: Secondary | ICD-10-CM | POA: Diagnosis present

## 2020-07-30 DIAGNOSIS — A419 Sepsis, unspecified organism: Secondary | ICD-10-CM

## 2020-07-30 DIAGNOSIS — J9621 Acute and chronic respiratory failure with hypoxia: Secondary | ICD-10-CM | POA: Diagnosis not present

## 2020-07-30 DIAGNOSIS — N289 Disorder of kidney and ureter, unspecified: Secondary | ICD-10-CM | POA: Diagnosis not present

## 2020-07-30 DIAGNOSIS — Z20822 Contact with and (suspected) exposure to covid-19: Secondary | ICD-10-CM | POA: Diagnosis not present

## 2020-07-30 DIAGNOSIS — Z66 Do not resuscitate: Secondary | ICD-10-CM | POA: Diagnosis not present

## 2020-07-30 DIAGNOSIS — R509 Fever, unspecified: Secondary | ICD-10-CM | POA: Diagnosis not present

## 2020-07-30 DIAGNOSIS — E872 Acidosis: Secondary | ICD-10-CM | POA: Diagnosis not present

## 2020-07-30 DIAGNOSIS — R000 Tachycardia, unspecified: Secondary | ICD-10-CM | POA: Diagnosis not present

## 2020-07-30 DIAGNOSIS — M818 Other osteoporosis without current pathological fracture: Secondary | ICD-10-CM | POA: Diagnosis present

## 2020-07-30 DIAGNOSIS — N3000 Acute cystitis without hematuria: Secondary | ICD-10-CM | POA: Diagnosis not present

## 2020-07-30 DIAGNOSIS — J470 Bronchiectasis with acute lower respiratory infection: Secondary | ICD-10-CM | POA: Diagnosis present

## 2020-07-30 DIAGNOSIS — R0902 Hypoxemia: Secondary | ICD-10-CM | POA: Diagnosis not present

## 2020-07-30 DIAGNOSIS — R61 Generalized hyperhidrosis: Secondary | ICD-10-CM | POA: Diagnosis present

## 2020-07-30 DIAGNOSIS — M25531 Pain in right wrist: Secondary | ICD-10-CM | POA: Diagnosis present

## 2020-07-30 DIAGNOSIS — E559 Vitamin D deficiency, unspecified: Secondary | ICD-10-CM | POA: Diagnosis present

## 2020-07-30 DIAGNOSIS — M7989 Other specified soft tissue disorders: Secondary | ICD-10-CM | POA: Diagnosis not present

## 2020-07-30 DIAGNOSIS — H9190 Unspecified hearing loss, unspecified ear: Secondary | ICD-10-CM | POA: Diagnosis present

## 2020-07-30 DIAGNOSIS — J189 Pneumonia, unspecified organism: Secondary | ICD-10-CM | POA: Diagnosis not present

## 2020-07-30 DIAGNOSIS — J9690 Respiratory failure, unspecified, unspecified whether with hypoxia or hypercapnia: Secondary | ICD-10-CM | POA: Diagnosis not present

## 2020-07-30 DIAGNOSIS — N179 Acute kidney failure, unspecified: Secondary | ICD-10-CM | POA: Diagnosis present

## 2020-07-30 DIAGNOSIS — C7911 Secondary malignant neoplasm of bladder: Secondary | ICD-10-CM | POA: Diagnosis not present

## 2020-07-30 LAB — BRAIN NATRIURETIC PEPTIDE: B Natriuretic Peptide: 188 pg/mL — ABNORMAL HIGH (ref 0.0–100.0)

## 2020-07-30 LAB — URINALYSIS, ROUTINE W REFLEX MICROSCOPIC
Bilirubin Urine: NEGATIVE
Glucose, UA: NEGATIVE mg/dL
Ketones, ur: NEGATIVE mg/dL
Nitrite: NEGATIVE
Protein, ur: NEGATIVE mg/dL
Specific Gravity, Urine: 1.012 (ref 1.005–1.030)
WBC, UA: >50 WBC/hpf — ABNORMAL HIGH (ref 0–5)
pH: 7 (ref 5.0–8.0)

## 2020-07-30 LAB — BLOOD CULTURE ID PANEL (REFLEXED)
Acinetobacter baumannii: NOT DETECTED
Candida albicans: NOT DETECTED
Candida glabrata: NOT DETECTED
Candida krusei: NOT DETECTED
Candida parapsilosis: NOT DETECTED
Candida tropicalis: NOT DETECTED
Carbapenem resistance: NOT DETECTED
Enterobacter cloacae complex: NOT DETECTED
Enterobacteriaceae species: DETECTED — AB
Enterococcus species: NOT DETECTED
Escherichia coli: NOT DETECTED
Haemophilus influenzae: NOT DETECTED
Klebsiella oxytoca: DETECTED — AB
Klebsiella pneumoniae: NOT DETECTED
Listeria monocytogenes: NOT DETECTED
Neisseria meningitidis: NOT DETECTED
Proteus species: NOT DETECTED
Pseudomonas aeruginosa: NOT DETECTED
Serratia marcescens: NOT DETECTED
Staphylococcus aureus (BCID): NOT DETECTED
Staphylococcus species: NOT DETECTED
Streptococcus agalactiae: NOT DETECTED
Streptococcus pneumoniae: NOT DETECTED
Streptococcus pyogenes: NOT DETECTED
Streptococcus species: NOT DETECTED

## 2020-07-30 LAB — LACTIC ACID, PLASMA
Lactic Acid, Venous: 4.7 mmol/L (ref 0.5–1.9)
Lactic Acid, Venous: 4.4 mmol/L (ref 0.5–1.9)
Lactic Acid, Venous: 4.2 mmol/L (ref 0.5–1.9)

## 2020-07-30 LAB — BASIC METABOLIC PANEL
Anion gap: 14 (ref 5–15)
BUN: 26 mg/dL — ABNORMAL HIGH (ref 8–23)
CO2: 16 mmol/L — ABNORMAL LOW (ref 22–32)
Calcium: 9 mg/dL (ref 8.9–10.3)
Chloride: 108 mmol/L (ref 98–111)
Creatinine, Ser: 1.78 mg/dL — ABNORMAL HIGH (ref 0.61–1.24)
GFR calc Af Amer: 43 mL/min — ABNORMAL LOW (ref >60)
GFR calc non Af Amer: 37 mL/min — ABNORMAL LOW (ref >60)
Glucose, Bld: 125 mg/dL — ABNORMAL HIGH (ref 70–99)
Potassium: 4.3 mmol/L (ref 3.5–5.1)
Sodium: 138 mmol/L (ref 135–145)

## 2020-07-30 LAB — CBC WITH DIFFERENTIAL/PLATELET
Abs Immature Granulocytes: 0.04 10*3/uL (ref 0.00–0.07)
Basophils Absolute: 0 10*3/uL (ref 0.0–0.1)
Basophils Relative: 0 %
Eosinophils Absolute: 0 10*3/uL (ref 0.0–0.5)
Eosinophils Relative: 0 %
HCT: 37 % — ABNORMAL LOW (ref 39.0–52.0)
Hemoglobin: 11.2 g/dL — ABNORMAL LOW (ref 13.0–17.0)
Immature Granulocytes: 1 %
Lymphocytes Relative: 4 %
Lymphs Abs: 0.3 10*3/uL — ABNORMAL LOW (ref 0.7–4.0)
MCH: 26.9 pg (ref 26.0–34.0)
MCHC: 30.3 g/dL (ref 30.0–36.0)
MCV: 88.7 fL (ref 80.0–100.0)
Monocytes Absolute: 0.4 10*3/uL (ref 0.1–1.0)
Monocytes Relative: 4 %
Neutro Abs: 7.6 10*3/uL (ref 1.7–7.7)
Neutrophils Relative %: 91 %
Platelets: 148 10*3/uL — ABNORMAL LOW (ref 150–400)
RBC: 4.17 MIL/uL — ABNORMAL LOW (ref 4.22–5.81)
RDW: 19.6 % — ABNORMAL HIGH (ref 11.5–15.5)
WBC: 8.4 10*3/uL (ref 4.0–10.5)
nRBC: 0 % (ref 0.0–0.2)

## 2020-07-30 LAB — APTT: aPTT: 35 seconds (ref 24–36)

## 2020-07-30 LAB — COMPREHENSIVE METABOLIC PANEL
ALT: 22 U/L (ref 0–44)
AST: 42 U/L — ABNORMAL HIGH (ref 15–41)
Albumin: 3.1 g/dL — ABNORMAL LOW (ref 3.5–5.0)
Alkaline Phosphatase: 83 U/L (ref 38–126)
Anion gap: 10 (ref 5–15)
BUN: 25 mg/dL — ABNORMAL HIGH (ref 8–23)
CO2: 20 mmol/L — ABNORMAL LOW (ref 22–32)
Calcium: 9.2 mg/dL (ref 8.9–10.3)
Chloride: 106 mmol/L (ref 98–111)
Creatinine, Ser: 1.63 mg/dL — ABNORMAL HIGH (ref 0.61–1.24)
GFR calc Af Amer: 47 mL/min — ABNORMAL LOW (ref >60)
GFR calc non Af Amer: 41 mL/min — ABNORMAL LOW (ref >60)
Glucose, Bld: 193 mg/dL — ABNORMAL HIGH (ref 70–99)
Potassium: 4.8 mmol/L (ref 3.5–5.1)
Sodium: 136 mmol/L (ref 135–145)
Total Bilirubin: 0.4 mg/dL (ref 0.3–1.2)
Total Protein: 7.4 g/dL (ref 6.5–8.1)

## 2020-07-30 LAB — CBC
HCT: 38.1 % — ABNORMAL LOW (ref 39.0–52.0)
Hemoglobin: 11.3 g/dL — ABNORMAL LOW (ref 13.0–17.0)
MCH: 27.8 pg (ref 26.0–34.0)
MCHC: 29.7 g/dL — ABNORMAL LOW (ref 30.0–36.0)
MCV: 93.6 fL (ref 80.0–100.0)
Platelets: ADEQUATE 10*3/uL (ref 150–400)
RBC: 4.07 MIL/uL — ABNORMAL LOW (ref 4.22–5.81)
RDW: 19.9 % — ABNORMAL HIGH (ref 11.5–15.5)
WBC: 10 10*3/uL (ref 4.0–10.5)
nRBC: 0 % (ref 0.0–0.2)

## 2020-07-30 LAB — CBG MONITORING, ED
Glucose-Capillary: 132 mg/dL — ABNORMAL HIGH (ref 70–99)
Glucose-Capillary: 130 mg/dL — ABNORMAL HIGH (ref 70–99)

## 2020-07-30 LAB — GLUCOSE, CAPILLARY
Glucose-Capillary: 192 mg/dL — ABNORMAL HIGH (ref 70–99)
Glucose-Capillary: 166 mg/dL — ABNORMAL HIGH (ref 70–99)

## 2020-07-30 LAB — MAGNESIUM
Magnesium: 1.5 mg/dL — ABNORMAL LOW (ref 1.7–2.4)
Magnesium: 1.9 mg/dL (ref 1.7–2.4)

## 2020-07-30 LAB — PROTIME-INR
INR: 1.3 — ABNORMAL HIGH (ref 0.8–1.2)
Prothrombin Time: 15.6 seconds — ABNORMAL HIGH (ref 11.4–15.2)

## 2020-07-30 LAB — CORTISOL: Cortisol, Plasma: 51.7 ug/dL

## 2020-07-30 LAB — MRSA PCR SCREENING: MRSA by PCR: NEGATIVE

## 2020-07-30 LAB — HEMOGLOBIN A1C
Hgb A1c MFr Bld: 5.5 % (ref 4.8–5.6)
Mean Plasma Glucose: 111.15 mg/dL

## 2020-07-30 LAB — SARS CORONAVIRUS 2 BY RT PCR (HOSPITAL ORDER, PERFORMED IN ~~LOC~~ HOSPITAL LAB): SARS Coronavirus 2: NEGATIVE

## 2020-07-30 MED ORDER — SODIUM CHLORIDE 0.9 % IV SOLN: INTRAVENOUS | Status: DC

## 2020-07-30 MED ORDER — PANTOPRAZOLE SODIUM 40 MG PO TBEC
40.0000 mg | DELAYED_RELEASE_TABLET | Freq: Every day | ORAL | Status: DC
  Administered 2020-07-30 – 2020-08-02 (×4): 40 mg via ORAL
  Filled 2020-07-30 (×4): qty 1

## 2020-07-30 MED ORDER — INSULIN ASPART 100 UNIT/ML ~~LOC~~ SOLN: 0.0000 [IU] | Freq: Every day | SUBCUTANEOUS | Status: DC

## 2020-07-30 MED ORDER — ALBUTEROL SULFATE (2.5 MG/3ML) 0.083% IN NEBU
2.5000 mg | INHALATION_SOLUTION | Freq: Once | RESPIRATORY_TRACT | Status: DC
  Filled 2020-07-30: qty 3

## 2020-07-30 MED ORDER — SODIUM CHLORIDE 0.9 % IV SOLN: 2.0000 g | INTRAVENOUS | Status: DC

## 2020-07-30 MED ORDER — POLYETHYLENE GLYCOL 3350 17 G PO PACK: 17.0000 g | PACK | Freq: Every day | ORAL | Status: DC | PRN

## 2020-07-30 MED ORDER — ACETAMINOPHEN 325 MG PO TABS
650.0000 mg | ORAL_TABLET | Freq: Four times a day (QID) | ORAL | Status: DC | PRN
  Administered 2020-07-31 (×2): 650 mg via ORAL
  Filled 2020-07-30 (×2): qty 2

## 2020-07-30 MED ORDER — VANCOMYCIN HCL 1250 MG/250ML IV SOLN: 1250.0000 mg | INTRAVENOUS | Status: DC

## 2020-07-30 MED ORDER — ACETAMINOPHEN 325 MG PO TABS
650.0000 mg | ORAL_TABLET | Freq: Once | ORAL | Status: AC
  Administered 2020-07-30: 650 mg via ORAL
  Filled 2020-07-30: qty 2

## 2020-07-30 MED ORDER — ONDANSETRON HCL 4 MG PO TABS: 4.0000 mg | ORAL_TABLET | Freq: Four times a day (QID) | ORAL | Status: DC | PRN

## 2020-07-30 MED ORDER — INSULIN DETEMIR 100 UNIT/ML ~~LOC~~ SOLN
16.0000 [IU] | Freq: Every day | SUBCUTANEOUS | Status: DC
  Administered 2020-07-30 – 2020-08-01 (×3): 16 [IU] via SUBCUTANEOUS
  Filled 2020-07-30 (×5): qty 0.16

## 2020-07-30 MED ORDER — MAGNESIUM SULFATE 2 GM/50ML IV SOLN
2.0000 g | Freq: Once | INTRAVENOUS | Status: AC
  Administered 2020-07-30: 2 g via INTRAVENOUS
  Filled 2020-07-30: qty 50

## 2020-07-30 MED ORDER — SODIUM CHLORIDE 0.9 % IV SOLN
2.0000 g | Freq: Two times a day (BID) | INTRAVENOUS | Status: DC
  Administered 2020-07-30: 2 g via INTRAVENOUS
  Filled 2020-07-30: qty 2

## 2020-07-30 MED ORDER — IPRATROPIUM-ALBUTEROL 0.5-2.5 (3) MG/3ML IN SOLN
3.0000 mL | Freq: Four times a day (QID) | RESPIRATORY_TRACT | Status: DC
  Administered 2020-07-30 – 2020-07-31 (×5): 3 mL via RESPIRATORY_TRACT
  Filled 2020-07-30 (×7): qty 3

## 2020-07-30 MED ORDER — INSULIN ASPART 100 UNIT/ML ~~LOC~~ SOLN
0.0000 [IU] | Freq: Three times a day (TID) | SUBCUTANEOUS | Status: DC
  Administered 2020-07-30: 2 [IU] via SUBCUTANEOUS
  Administered 2020-07-30: 3 [IU] via SUBCUTANEOUS
  Administered 2020-07-30: 2 [IU] via SUBCUTANEOUS
  Administered 2020-07-31: 3 [IU] via SUBCUTANEOUS
  Administered 2020-07-31: 2 [IU] via SUBCUTANEOUS

## 2020-07-30 MED ORDER — COLCHICINE 0.6 MG PO TABS
0.6000 mg | ORAL_TABLET | Freq: Two times a day (BID) | ORAL | Status: DC
  Administered 2020-07-30 – 2020-08-02 (×6): 0.6 mg via ORAL
  Filled 2020-07-30 (×7): qty 1

## 2020-07-30 MED ORDER — SODIUM CHLORIDE 0.9 % IV SOLN
2.0000 g | INTRAVENOUS | Status: DC
  Administered 2020-07-30 – 2020-07-31 (×2): 2 g via INTRAVENOUS
  Filled 2020-07-30 (×2): qty 20

## 2020-07-30 MED ORDER — SODIUM CHLORIDE 0.9 % IV SOLN
250.0000 mL | INTRAVENOUS | Status: DC
  Administered 2020-07-30: 250 mL via INTRAVENOUS

## 2020-07-30 MED ORDER — ACETAMINOPHEN 500 MG PO TABS
1000.0000 mg | ORAL_TABLET | Freq: Once | ORAL | Status: AC
  Administered 2020-07-30: 1000 mg via ORAL
  Filled 2020-07-30: qty 2

## 2020-07-30 MED ORDER — APIXABAN 5 MG PO TABS
5.0000 mg | ORAL_TABLET | Freq: Two times a day (BID) | ORAL | Status: DC
  Administered 2020-07-30 – 2020-08-02 (×7): 5 mg via ORAL
  Filled 2020-07-30 (×9): qty 1

## 2020-07-30 MED ORDER — DICLOFENAC SODIUM 1 % EX GEL
Freq: Two times a day (BID) | CUTANEOUS | Status: DC
  Filled 2020-07-30 (×3): qty 100

## 2020-07-30 MED ORDER — METHYLPREDNISOLONE SODIUM SUCC 40 MG IJ SOLR
40.0000 mg | Freq: Two times a day (BID) | INTRAMUSCULAR | Status: DC
  Administered 2020-07-30: 40 mg via INTRAVENOUS
  Filled 2020-07-30: qty 1

## 2020-07-30 MED ORDER — SODIUM CHLORIDE 0.9 % IV SOLN
100.0000 mg | Freq: Two times a day (BID) | INTRAVENOUS | Status: DC
  Administered 2020-07-30: 100 mg via INTRAVENOUS
  Filled 2020-07-30 (×3): qty 100

## 2020-07-30 MED ORDER — NOREPINEPHRINE 4 MG/250ML-% IV SOLN
2.0000 ug/min | INTRAVENOUS | Status: DC
  Administered 2020-07-30: 2 ug/min via INTRAVENOUS
  Filled 2020-07-30: qty 250

## 2020-07-30 MED ORDER — CHLORHEXIDINE GLUCONATE CLOTH 2 % EX PADS
6.0000 | MEDICATED_PAD | Freq: Every day | CUTANEOUS | Status: DC
  Administered 2020-07-30 – 2020-08-02 (×4): 6 via TOPICAL

## 2020-07-30 MED ORDER — ACETAMINOPHEN 650 MG RE SUPP: 650.0000 mg | Freq: Four times a day (QID) | RECTAL | Status: DC | PRN

## 2020-07-30 MED ORDER — ONDANSETRON HCL 4 MG/2ML IJ SOLN: 4.0000 mg | Freq: Four times a day (QID) | INTRAMUSCULAR | Status: DC | PRN

## 2020-07-30 MED ORDER — STERILE WATER FOR INJECTION IV SOLN
INTRAVENOUS | Status: DC
  Filled 2020-07-30 (×2): qty 850

## 2020-07-30 NOTE — ED Notes (Signed)
Pt wife offered a recliner but refused

## 2020-07-30 NOTE — Consult Note (Addendum)
NAME:  Jimmy [name], MRN:  [id number], DOB:  1946-11-01, LOS: 0 ADMISSION DATE:  07/29/2020, CONSULTATION DATE:  7/31 REFERRING MD: Dr. Tyrell Antonio, CHIEF COMPLAINT:  Fever   Brief History   74 y/o M admitted 7/30 with concern for sepsis, suspected urinary source.   History of present illness   74 y/o M who presented to [facility] on 7/30 with complaints of acute onset fever.   The patient was diagnosed with [id number] high-grade urothelial carcinoma of the bladder in June 2020.  His cancer course has included TURBT with bilateral ureteral stents, neoadjuvant chemotherapy from 08/2019 through 10/2019 and ultimately a radical cystectomy with lymph node dissection in 12/2019.  While recovering from his cystectomy, he had home health visitation that was sent out in February 2021.  Unfortunately, he developed COVID after visitation from [place] per his wife.  He was admitted to [facility] for some time (she mentions he was prone and had bleeding from his stoma).  Wife reports he had difficulties with bloody urine (shows picture with dark bloody bag of urine).  He had a port but at some point was removed due to infection.  He developed pulmonary nodules in June 2021.  PET scan on 07/22/20 showed bilateral pulmonary nodules that had increased in size and FDG activity.  He was planned to start Pembrolizumab 08/08/20.  His wife reports that he has been doing very well recovering from his last year of illness.  She notes he has been using a cane / walker and working with home health PT.  His oxygen had been weaned to RA during daytime and 1.5L with exertion.    He was in his usual state of health until around 8pm on 7/30.  They ate dinner and were watching TV when he developed sudden onset shaking chills.  She checked his temp which was 101 and by the time EMS arrived, it was 103.  Initial ER work up concerning for urosepsis.  CXR unchanged from prior imaging in 02/2020 post COVID.  No diarrhea or gastric symptoms, denies cough /  SOB.  Lactic acid elevated at 4.7 > 4.4.  He received 4L IVF.  BP remained soft normal.    PCCM consulted for evaluation.  Past Medical History  COVID- in 02/2020  CAD HLD DM  Recurrent Prostate Cancer  OSA - does not wear his CPAP  Significant Hospital Events   7/30 Admit with fever, concern for sepsis  7/31 PCCM consulted   Consults:  PCCM   Procedures:     Significant Diagnostic Tests:  PET Scan 07/22/20 >> 5 suspicious lung nodules worrisome for metastatic disease, no findings of nodal metastasis or solid organ metastasis within the abdomen or pelvis   Micro Data:  COVID 7/31 >> negative   Antimicrobials:  Vanco 7/30 >>  Cefepime 7/30 >>    Interim history/subjective:  Wife reports he appears to be improving   Objective   Blood pressure (!) 98/38, pulse (!) 117, temperature 97.6 F (36.4 C), temperature source Oral, resp. rate 18, height 5\' 8"  (1.727 m), weight (!) 100.4 kg, SpO2 95 %.        Intake/Output Summary (Last 24 hours) at 07/30/2020 0840 Last data filed at 07/30/2020 0831 Gross per 24 hour  Intake 250 ml  Output --  Net 250 ml   Filed Weights   07/29/20 2303 07/30/20 0053  Weight: (!) 100.4 kg (!) 100.4 kg    Examination: General: elderly male lying in bed in NAD, appears  ill HEENT: MM pink/moist, [place] O2, very HOH Neuro: AAOx4, speech clear, MAE CV: s1s2 rrr, ST 110-110's, no m/r/g PULM: non-labored on 4L, lungs bilaterally clear GI: soft, bsx4 active, urostomy with beefy red stoma, clear urine in bag Extremities: warm/dry, trace genteralized edema  Skin: no rashes or lesions  CXR 7/31 - images personally reviewed, increased interstitial markings, no organized infiltrate.  Unchanged when compared to 02/2020 imaging   Resolved Hospital Problem list     Assessment & Plan:   Severe Sepsis  UTI  Suspected urinary source, doubt pulmonary given no new imaging fidings  -continue vanco, cefepime  -follow cultures -repeat lactate now, if not  clearing will admit to ICU for pressors  -follow BP trend, currently remaining > 010 systolic  -NS at 27OZ/DG  -assess cortisol (will not be accurate as received solumedrol, recognized this after drawn) -stop solumedrol   AKI  Lactic Acidosis  -Trend BMP / urinary output -Replace electrolytes as indicated -Avoid nephrotoxic agents, ensure adequate renal perfusion -continue bicarb gtt for now (not yet started in ER)  High-grade Urothelial Carcinoma of the Bladder Urostomy  -urostomy care per protocol  -monitor UOP   Anemia  Thrombocytopenia  -trend CBC  -transfuse for Hgb<7% or active bleeding   Hx HTN, HLD, CAD  -hold home metoprolol   Hx COVID, LLE DVT (02/2020) -continue eliquis   DM  -SSI, moderate scale with HS coverage  -hold home metformin with AKI  Pulmonary Nodules, Suspected Metastatic Disease Pt planned for immunotherapy initiation 08/08/2020 -follow up with ONC as outpatient   OSA  -does not wear CPAP  Best practice:  Diet: Carb modified diet  Pain/Anxiety/Delirium protocol (if indicated): n/a VAP protocol (if indicated): n/a  DVT prophylaxis: Eliquis  GI prophylaxis: PPI Glucose control: SSI  Mobility: As tolerated  Code Status: DNR  Family Communication: Wife updated at bedside 7/31  Disposition: pending lactate  Labs   CBC: Recent Labs  Lab 07/29/20 2329  WBC 8.4  NEUTROABS 7.6  HGB 11.2*  HCT 37.0*  MCV 88.7  PLT 148*    Basic Metabolic Panel: Recent Labs  Lab 07/29/20 2329 07/30/20 0056 07/30/20 0633  NA 136  --  138  K 4.8  --  4.3  CL 106  --  108  CO2 20*  --  16*  GLUCOSE 193*  --  125*  BUN 25*  --  26*  CREATININE 1.63*  --  1.78*  CALCIUM 9.2  --  9.0  MG  --  1.5* 1.9   GFR: Estimated Creatinine Clearance: 41.8 mL/min (A) (by C-G formula based on SCr of 1.78 mg/dL (H)). Recent Labs  Lab 07/29/20 2329 07/30/20 0210 07/30/20 0633  WBC 8.4  --   --   LATICACIDVEN  --  4.7* 4.4*    Liver Function  Tests: Recent Labs  Lab 07/29/20 2329  AST 42*  ALT 22  ALKPHOS 83  BILITOT 0.4  PROT 7.4  ALBUMIN 3.1*   No results for input(s): LIPASE, AMYLASE in the last 168 hours. No results for input(s): AMMONIA in the last 168 hours.  ABG No results found for: PHART, PCO2ART, PO2ART, HCO3, TCO2, ACIDBASEDEF, O2SAT   Coagulation Profile: Recent Labs  Lab 07/29/20 2329  INR 1.3*    Cardiac Enzymes: No results for input(s): CKTOTAL, CKMB, CKMBINDEX, TROPONINI in the last 168 hours.  HbA1C: Hgb A1c MFr Bld  Date/Time Value Ref Range Status  07/30/2020 06:33 AM 5.5 4.8 - 5.6 % Final  Comment:    (NOTE) Pre diabetes:          5.7%-6.4%  Diabetes:              >6.4%  Glycemic control for   <7.0% adults with diabetes   02/09/2020 04:32 AM 6.0 (H) 4.8 - 5.6 % Final    Comment:    (NOTE)         Prediabetes: 5.7 - 6.4         Diabetes: >6.4         Glycemic control for adults with diabetes: <7.0     CBG: Recent Labs  Lab 07/30/20 0809  GLUCAP 132*    Review of Systems: Positives in [place]   Gen: Denies fever, chills, weight change, fatigue, night sweats HEENT: Denies blurred vision, double vision, hearing loss, tinnitus, sinus congestion, rhinorrhea, sore throat, neck stiffness, dysphagia PULM: Denies shortness of breath, cough, sputum production, hemoptysis, wheezing CV: Denies chest pain, edema, orthopnea, paroxysmal nocturnal dyspnea, palpitations GI: Denies abdominal pain, nausea, vomiting, diarrhea, hematochezia, melena, constipation, change in bowel habits GU: Denies dysuria, hematuria, polyuria, oliguria, urethral discharge Endocrine: Denies hot or cold intolerance, polyuria, polyphagia or appetite change Derm: Denies rash, dry skin, scaling or peeling skin change Heme: Denies easy bruising, bleeding, bleeding gums Neuro: Denies headache, numbness, weakness, slurred speech, loss of memory or consciousness   Past Medical History  He,  has a past medical  history of Astigmatism of both eyes (11/01/2016), Bronchitis, Coronary artery disease, Cortical age-related cataract of both eyes (11/01/2016), Diabetes mellitus without complication ([place]), Drug-induced osteoporosis, Dyslipidemia, Fatigue, Fatty liver, History of kidney stones, Hypercholesteremia, Hypogonadism male, Ischemic heart disease, Normal nuclear stress test (March 2011), Nuclear sclerotic cataract of both eyes (11/01/2016), Obesity, Prostate cancer ([place]), Sleep apnea, and Vitamin D insufficiency.   Surgical History    Past Surgical History:  Procedure Laterality Date  . BUBBLE STUDY  12/30/2019   Procedure: BUBBLE STUDY;  Surgeon: Pixie Casino, MD;  Location: [place];  Service: Cardiovascular;;  . CARDIAC CATHETERIZATION  April 2000  . IR IMAGING GUIDED PORT INSERTION  07/30/2019  . IR REMOVAL TUN ACCESS W/ PORT W/O FL MOD SED  12/26/2019  . RADIOACTIVE SEED IMPLANT  2009   Implanted radiation seeds  . TEE WITHOUT CARDIOVERSION N/A 12/30/2019   Procedure: TRANSESOPHAGEAL ECHOCARDIOGRAM (TEE);  Surgeon: Pixie Casino, MD;  Location: [facility] ENDOSCOPY;  Service: Cardiovascular;  Laterality: N/A;  . thumb surgery     as a teen caught thumb in machine  . TRANSURETHRAL RESECTION OF BLADDER TUMOR N/A 06/22/2019   Procedure: TRANSURETHRAL RESECTION OF BLADDER TUMOR (TURBT), CYSTOSCOPY,  BILATERAL RETROGRADE PYELOGRAPHY, BILATERAL STENT PLACEMENT;  Surgeon: Raynelle Bring, MD;  Location: [facility] ORS;  Service: Urology;  Laterality: N/A;  GENERAL WITH PARALYSIS     Social History   reports that he quit smoking about 20 years ago. His smoking use included cigarettes. He has a 40.00 pack-year smoking history. He has never used smokeless tobacco. He reports current alcohol use of about 1.0 standard drink of alcohol per week. He reports that he does not use drugs.   Family History   His family history includes COPD in his father.   Allergies Allergies  Allergen Reactions  . Ace Inhibitors  Cough  . Cephalexin Hives, Diarrhea and Nausea Only    Tolerates Ancef, Rocephin, Cefoxitin  . Crestor [Rosuvastatin Calcium]   . Lipitor [Atorvastatin Calcium] Other (See Comments)    MUSCLE PAINS   . Plavix [Clopidogrel  Bisulfate] Hives  . Vancomycin Nausea And Vomiting    Po route caused n/v.      Home Medications  Prior to Admission medications   Medication Sig Start Date End Date Taking? Authorizing Provider  acetaminophen (TYLENOL) 325 MG tablet Take 2 tablets (650 mg total) by mouth every 6 (six) hours as needed for mild pain or headache (fever >/= 101). 02/21/20  Yes Allie Bossier, MD  apixaban (ELIQUIS) 5 MG TABS tablet Take 1 tablet (5 mg total) by mouth 2 (two) times daily. 04/12/20  Yes [name], MD  COMBIVENT RESPIMAT 20-100 MCG/ACT AERS respimat USE 1 PUFF 4 TIMES A DAY RINSE MOUTH AFTER USE Patient taking differently: Inhale 1 puff into the lungs in the morning, at noon, in the evening, and at bedtime. Rinse mouth after use 05/12/20  Yes [name], MD  ferrous sulfate 325 (65 FE) MG tablet Take 325 mg by mouth daily with breakfast.   Yes [provider]  LEVEMIR FLEXTOUCH 100 UNIT/ML FlexPen INJECT 16 UNITS TWICE A DAY Patient taking differently: Inject 16 Units into the skin every evening.  05/26/20  Yes [name], MD  metFORMIN (GLUCOPHAGE) 1000 MG tablet Take 1,000 mg by mouth daily.    Yes [provider]  metoprolol tartrate (LOPRESSOR) 25 MG tablet TAKE 1/2 TABLET TWICE A DAY Patient taking differently: Take 12.5 mg by mouth 2 (two) times daily.  04/15/20  Yes [name], MD  Multiple Vitamin (MULTIVITAMIN WITH MINERALS) TABS tablet Take 1 tablet by mouth daily. 02/21/20  Yes Allie Bossier, MD  pantoprazole (PROTONIX) 40 MG tablet Take 40 mg by mouth daily.   Yes [provider]  polyethylene glycol (MIRALAX / GLYCOLAX) 17 g packet Take 17 g by mouth daily as needed for moderate constipation or severe constipation.  02/21/20  Yes Allie Bossier, MD  Accu-Chek Softclix Lancets lancets 1 each by Other route in the morning, at noon, and at bedtime. Use as instructed 03/24/20   [name], MD  Colchicine 0.6 MG CAPS Take 0.6 mg by mouth 2 (two) times daily. Patient not taking: Reported on 07/28/2020 06/24/20   [name], MD  glucose blood test strip 1 each by Other route in the morning, at noon, and at bedtime. Use as instructed 03/24/20   [name], MD  Insulin Pen Needle 32G X 4 MM MISC Use for injections twice daily 04/22/20   [name], MD     Critical care time: 67 minutes     Noe Gens, MSN, NP-C [facility] 07/30/2020, 8:41 AM   Please see [email or web address] for pager details.

## 2020-07-30 NOTE — Sepsis Progress Note (Signed)
Notified bedside nurse of need to draw repeat lactic acid. 

## 2020-07-30 NOTE — H&P (Signed)
PCP:   [name], MD   Chief Complaint:  Fever  HPI: This is a 74 year old male who has a history of prostate cancer over 10 years ago.  That was treated and in remission.  Two years ago was diagnosed with bladder cancer.  The bladder was removed 12/20, he received treatment and he was discharged home with visiting home nursing.  He contracted Covid per wife from the visiting home nurse. He was readmitted, treated, had complications including blood clots and encephalitis.  He has been essentially bedbound, more recently using a walker and now a cane.  2 weeks ago he had a PET scan which showed recurrent metastasis for bladder cancer.  He did metastasized to the lungs.  Yesterday he was scheduled for immunotherapy beginning 8/11.  Tonight the patient was at home relaxing watching TV with his wife when he stated he was cold.  He developed sudden onset of severe shivering and chills.  Temperature checked by wife and EMS revealed a T-max of 103.  The patient also complains of shortness of breath but more with activity.  He does have a mild cough noted mainly in the ER.  There is no altered mentation but the patient is very very hard of hearing.  The patient has a external bladder pouch.  Per wife there is no change in smell.  There may be a change in color based on how concentrated the urine is.  The patient is also complaining of pretty significant pain in the right wrist. He is patient is right-handed.   In the ER the patient is diaphoretic, febrile.  He is also hypotensive currently.  Urinalysis is positive.  Hospitalist have been asked to admit.  Patient has a very supportive family.  History provided by his wife is present at bedside.  Review of Systems:  The patient denies anorexia, fever, weight loss,, vision loss, decreased hearing, hoarseness, chest pain, syncope, dyspnea on exertion, peripheral edema, balance deficits, hemoptysis, abdominal pain, melena, hematochezia, severe  indigestion/heartburn, hematuria, incontinence, genital sores, muscle weakness, suspicious skin lesions, transient blindness, difficulty walking, depression, unusual weight change, abnormal bleeding, enlarged lymph nodes, angioedema, and breast masses.  Past Medical History: Past Medical History:  Diagnosis Date  . Astigmatism of both eyes 11/01/2016  . Bronchitis   . Coronary artery disease   . Cortical age-related cataract of both eyes 11/01/2016  . Diabetes mellitus without complication ([place])    type 2  . Drug-induced osteoporosis   . Dyslipidemia   . Fatigue   . Fatty liver   . History of kidney stones   . Hypercholesteremia   . Hypogonadism male   . 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 Normal nuclear stress test March 2011  . Nuclear sclerotic cataract of both eyes 11/01/2016  . Obesity   . Prostate cancer [facility])    s/p seed implant and radiation  . Sleep apnea    90% of time dosent wear his cpap per pt  . Vitamin D insufficiency    Past Surgical History:  Procedure Laterality Date  . BUBBLE STUDY  12/30/2019   Procedure: BUBBLE STUDY;  Surgeon: Pixie Casino, MD;  Location: [place];  Service: Cardiovascular;;  . CARDIAC CATHETERIZATION  April 2000  . IR IMAGING GUIDED PORT INSERTION  07/30/2019  . IR REMOVAL TUN ACCESS W/ PORT W/O FL MOD SED  12/26/2019  .  RADIOACTIVE SEED IMPLANT  2009   Implanted radiation seeds  . TEE WITHOUT CARDIOVERSION N/A 12/30/2019   Procedure: TRANSESOPHAGEAL ECHOCARDIOGRAM (TEE);  Surgeon: Pixie Casino, MD;  Location: [facility] ENDOSCOPY;  Service: Cardiovascular;  Laterality: N/A;  . thumb surgery     as a teen caught thumb in machine  . TRANSURETHRAL RESECTION OF BLADDER TUMOR N/A 06/22/2019   Procedure: TRANSURETHRAL RESECTION OF BLADDER TUMOR (TURBT), CYSTOSCOPY,  BILATERAL RETROGRADE PYELOGRAPHY, BILATERAL STENT PLACEMENT;  Surgeon: Raynelle Bring, MD;  Location: [facility] ORS;  Service: Urology;  Laterality: N/A;  GENERAL WITH PARALYSIS    Medications: Prior to Admission medications   Medication Sig Start Date End Date Taking? Authorizing Provider  Accu-Chek Softclix Lancets lancets 1 each by Other route in the morning, at noon, and at bedtime. Use as instructed 03/24/20   [name], MD  acetaminophen (TYLENOL) 325 MG tablet Take 2 tablets (650 mg total) by mouth every 6 (six) hours as needed for mild pain or headache (fever >/= 101). 02/21/20   Allie Bossier, MD  apixaban (ELIQUIS) 5 MG TABS tablet Take 1 tablet (5 mg total) by mouth 2 (two) times daily. 04/12/20   [name], MD  Colchicine 0.6 MG CAPS Take 0.6 mg by mouth 2 (two) times daily. Patient not taking: Reported on 07/28/2020 06/24/20   [name], MD  COMBIVENT RESPIMAT 20-100 MCG/ACT AERS respimat USE 1 PUFF 4 TIMES A DAY RINSE MOUTH AFTER USE 05/12/20   [name], MD  glucose blood test strip 1 each by Other route in the morning, at noon, and at bedtime. Use as instructed 03/24/20   [name], MD  Insulin Pen Needle 32G X 4 MM MISC Use for injections twice daily 04/22/20   [name], MD  LEVEMIR FLEXTOUCH 100 UNIT/ML FlexPen INJECT 16 UNITS TWICE A DAY 05/26/20   [name], MD  metFORMIN (GLUCOPHAGE) 1000 MG tablet Take 1,000 mg by mouth daily.     [provider]  metoprolol tartrate (LOPRESSOR) 25 MG tablet TAKE 1/2 TABLET TWICE A DAY 04/15/20   [name], MD  Multiple Vitamin (MULTIVITAMIN WITH MINERALS) TABS tablet Take 1 tablet by mouth daily. 02/21/20   Allie Bossier, MD  pantoprazole (PROTONIX) 40 MG tablet Take 40 mg by mouth daily.    [provider]  polyethylene glycol (MIRALAX / GLYCOLAX) 17 g packet Take 17 g by mouth daily as needed for moderate constipation or severe constipation. 02/21/20   Allie Bossier, MD  traMADol Veatrice Bourbon) 50 MG tablet  05/04/20   [provider]    Allergies:    Allergies  Allergen Reactions  . Ace Inhibitors Cough  . Cephalexin Hives, Diarrhea and Nausea Only    Tolerates Ancef, Rocephin, Cefoxitin  . Crestor [Rosuvastatin Calcium]   . Lipitor [Atorvastatin Calcium] Other (See Comments)    MUSCLE PAINS   . Plavix [Clopidogrel Bisulfate] Hives  . Vancomycin Nausea And Vomiting    Po route caused n/v.     Social History:  reports that he quit smoking about 20 years ago. His smoking use included cigarettes. He has a 40.00 pack-year smoking history. He has never used smokeless tobacco. He reports current alcohol use of about 1.0 standard drink of alcohol per week. He reports that he does not use drugs.  Family History: Family History  Problem Relation Age of Onset  . COPD Father     Physical Exam: Vitals:   07/30/20  0048 07/30/20 0053 07/30/20 0100 07/30/20 0115  BP: (!) 116/58  (!) 97/60 (!) 110/43  Pulse: (!) 134  (!) 138 (!) 133  Resp: (!) 29  (!) 34 (!) 31  Temp:      TempSrc:      SpO2: 96%  94% 95%  Weight:  (!) 100.4 kg    Height:  5\' 8"  (1.727 m)      General:  Alert and oriented times three, well developed and nourished, no acute distress, weak, hard of hearing  Eyes: PERRLA, pink conjunctiva, no scleral icterus ENT: Moist oral mucosa, neck supple, no thyromegaly Lungs: clear to ascultation, no wheeze, dry crackles posteriorly, no use of accessory muscles Cardiovascular: regular rate and rhythm, no regurgitation, no gallops, no murmurs. No carotid bruits, no JVD Abdomen: soft, positive BS, non-tender, non-distended, no organomegaly, not an acute abdomen.  Urostomy right lower quadrant GU: not examined Neuro: CN II - XII grossly intact, sensation intact Musculoskeletal: strength 5/5 all extremities, no clubbing, cyanosis or edema Skin: no rash, no subcutaneous crepitation, no decubitus Psych: appropriate patient Ext: B/L LE edema L>>R (per wife this all just developed today) Right hand: TTP greatest at wrist below the  hypothenar eminence but also on the medial portion of wrist. No swelling or erythema noted   Labs on Admission:  Recent Labs    07/29/20 2329  NA 136  K 4.8  CL 106  CO2 20*  GLUCOSE 193*  BUN 25*  CREATININE 1.63*  CALCIUM 9.2   Recent Labs    07/29/20 2329  AST 42*  ALT 22  ALKPHOS 83  BILITOT 0.4  PROT 7.4  ALBUMIN 3.1*   No results for input(s): LIPASE, AMYLASE in the last 72 hours. Recent Labs    07/29/20 2329  WBC 8.4  NEUTROABS 7.6  HGB 11.2*  HCT 37.0*  MCV 88.7  PLT 148*   No results for input(s): CKTOTAL, CKMB, CKMBINDEX, TROPONINI in the last 72 hours. Invalid input(s): POCBNP No results for input(s): DDIMER in the last 72 hours. No results for input(s): HGBA1C in the last 72 hours. No results for input(s): CHOL, HDL, LDLCALC, TRIG, CHOLHDL, LDLDIRECT in the last 72 hours. No results for input(s): TSH, T4TOTAL, T3FREE, THYROIDAB in the last 72 hours.  Invalid input(s): FREET3 No results for input(s): VITAMINB12, FOLATE, FERRITIN, TIBC, IRON, RETICCTPCT in the last 72 hours.  Micro Results: No results found for this or any previous visit (from the past 240 hour(s)). Results for KAMIN, NIBLACK "ED" (MRN [id number]) as of 07/30/2020 03:09  Ref. Range 07/30/2020 00:25  Appearance Latest Ref Range: CLEAR  CLOUDY (A)  Bilirubin Urine Latest Ref Range: NEGATIVE  NEGATIVE  Color, Urine Latest Ref Range: YELLOW  YELLOW  Glucose, UA Latest Ref Range: NEGATIVE mg/dL NEGATIVE  Hgb urine dipstick Latest Ref Range: NEGATIVE  MODERATE (A)  Ketones, ur Latest Ref Range: NEGATIVE mg/dL NEGATIVE  Leukocytes,Ua Latest Ref Range: NEGATIVE  LARGE (A)  Nitrite Latest Ref Range: NEGATIVE  NEGATIVE  pH Latest Ref Range: 5.0 - 8.0  7.0  Protein Latest Ref Range: NEGATIVE mg/dL NEGATIVE  Specific Gravity, Urine Latest Ref Range: 1.005 - 1.030  1.012  Bacteria, UA Latest Ref Range: NONE SEEN  MANY (A)  Mucus Unknown PRESENT  RBC / HPF Latest Ref Range: 0 - 5 RBC/hpf  21-50  WBC Clumps Unknown PRESENT  WBC, UA Latest Ref Range: 0 - 5 WBC/hpf >50 (H)    Radiological Exams on Admission: DG Chest  Portable 1 View  Result Date: 07/30/2020 CLINICAL DATA:  Fever EXAM: PORTABLE CHEST 1 VIEW COMPARISON:  February 10, 2020 FINDINGS: The heart size and mediastinal contours are within normal limits. Again noted are diffuse interstitial coarsened opacities throughout both lungs, there is slight interval worsening within the right lower lung and left perihilar region. No pleural effusion is seen. No acute osseous abnormality. IMPRESSION: Diffusely increased interstitial marking which could be due to chronic lung changes with superimposed infectious etiology Electronically Signed   By: Prudencio Pair M.D.   On: 07/30/2020 00:14    Assessment/Plan Present on Admission: . Sepsis ([place]) likely due to urinary etiology/hypotensive systolic blood pressure 87 -Admit to progressive care -Blood and urine cultures collected -IV vancomycin and cefepime ordered -Pressors are currently needed.  Continue IV fluid hydration.  Reassess -Gentle bolus   Possible pneumonia -Currently unclear as patient has chronic scarring from his Covid.  Will empirically add doxycycline, duonebs, oxygen to keep sats greater than 88%  Right wrist pain -Likely from arthritis with overuse.  Patient recently using a walker heavily and now a cane.  X-rays ordered.  Aspercreme gel  Left lower extremity swelling -This is bilateral but left is much much greater than right.  Doppler studies ordered for the left.  Eliquis continued.  Greater suspicion that this is due to fluid overload  Acute kidney injury -Creatinine up from baseline GFR 41.  Her GFR as of February 2/21 was > 60 -No nephrotoxic medications.  Like related to patient's UTI as well as possible dehydration.  Patient's bladder cancer is also recurrent. -Gentle balance between IV fluid hydration and fluid overload.  Normal saline at 50  cc -12/2019, EF 60 to 65%.  Normal BNP historically.  We will add a BNP  Hypomagnesemia -IV mag repleted in ER.  Mag levels in a.m.  [name] Coronary artery disease involving native coronary artery of native heart without angina pectoris -Undergoing some evaluation for right sided heart failure.  Recent echo 12/2019.  EF 60 to 65% -BNP ordered -Does not appear to be involved in the lung. -We will order strict I's and O's and daily weights -No Lasix ordered as patient is currently hypotensive  Bladder cancer -Recurrence, per oncologist  . Hyperlipidemia ([place]) -Stable, home meds resumed  . Hypertension([facility]) -Hypertension, blood pressure meds held  . Morbid obesity ([place]) -  . Pulmonary emphysema ([facility]) Chronic respiratory failure -Currently stable, oxygen  . Sleep apnea -Does not use CPAP  . Type II diabetes mellitus with complication ([facility]) -Lantus dose cut in half yesterday by PCP.  Most recent hemoglobin A1c 5 -Lantus ordered, sliding scale insulin   Marris Frontera 07/30/2020, 1:22 AM

## 2020-07-30 NOTE — Progress Notes (Signed)
Pharmacy Antibiotic Note  Jimmy [name] is a 74 y.o. male admitted on 07/29/2020 with hypotension/fever, possible sepsis.  Pharmacy has been consulted for Vancomycin  Dosing.  Vancomycin 2 g IV given in ED at  [place]: Vancomycin 1250 mg IV q24h  Height: 5\' 8"  (172.7 cm) Weight: (!) 100.4 kg (221 lb 5.5 oz) IBW/kg (Calculated) : 68.4  Temp (24hrs), Avg:100.6 F (38.1 C), Min:98.2 F (36.8 C), Max:102.9 F (39.4 C)  Recent Labs  Lab 07/29/20 2329  WBC 8.4  CREATININE 1.63*    Estimated Creatinine Clearance: 45.7 mL/min (A) (by C-G formula based on SCr of 1.63 mg/dL (H)).    Allergies  Allergen Reactions  . Ace Inhibitors Cough  . Cephalexin Hives, Diarrhea and Nausea Only    Tolerates Ancef, Rocephin, Cefoxitin  . Crestor [Rosuvastatin Calcium]   . Lipitor [Atorvastatin Calcium] Other (See Comments)    MUSCLE PAINS   . Plavix [Clopidogrel Bisulfate] Hives  . Vancomycin Nausea And Vomiting    Po route caused n/v.     [name] 07/30/2020 2:52 AM

## 2020-07-30 NOTE — Progress Notes (Signed)
PROGRESS NOTE    Jimmy [name]  SJG:[id number] DOB: [birth year] DOA: 07/29/2020 PCP: [name], MD   Brief Narrative: 74-year-old with past medical history significant for prostate cancer over 10 years ago, in remission.  2 years ago diagnosed with bladder cancer.  Bladder was removed 12/20.  Patient contacted Covid and has been requiring oxygen since then.  2 weeks ago he had a PET scan which showed recurrent metastasis of bladder cancer, metastasized to his lung.  He was a scheduled for immunotherapy on 8/11. The night of admission patient developed sudden onset shivering and chills, temperature checked by EMS was 103.  Patient also complaining of shortness of breath more with activities.  Patient admitted with sepsis, febrile, lactic acidosis with lactic acid at 4.   Assessment & Plan:   Principal Problem:   Sepsis ([place]) Active Problems:   Coronary artery disease involving native coronary artery of native heart without angina pectoris   Morbid obesity ([place])   History of prostate cancer   Sleep apnea   Hypertension associated with type 2 diabetes mellitus ([place])   Hyperlipidemia associated with type 2 diabetes mellitus ([place])   Type II diabetes mellitus with complication ([place])   Malignant neoplasm of urinary bladder ([place])   Port-A-Cath in place   Bladder cancer ([place])   Pulmonary emphysema ([place])   1-Sepsis;  Patient presents with fever, hypotension, tachypnea, UA with more than 50 white blood cells chest x-ray with chronic lung changes superimposed pneumonia cannot be ruled out. -Patient has received 4 L of IV fluids. -Continue with vancomycin and cefepime and doxy.  To cover for pneumonia and urinary source -Follow blood cultures urine culture. -MAP 50, CCM consulted. -Tylenol for fever and chills.  2-possible pneumonia, acute hypoxic respiratory failure on chronic He was requiring 1 or 2 L of oxygen at home.  Currently on 6 L oxygen saturation in the 90s Bilateral  wheezing, will start Solu-Medrol Scheduled nebulizer treatments.  Right wrist pain: Will need dedicated wrist x-ray when is stable Left lower extremity swelling: Dopplers ordered.  Continue with leg Eliquis Acute kidney injury: Continue with IV fluids.  Metabolic acidosis, secondary to sepsis: Start bicarb drip  Hypomagnesemia: Replace  CAD native coronary artery: Bladder cancer recurrence ;per oncologist Diabetes: Continue with a sliding scale insulin  Estimated body mass index is 33.65 kg/m as calculated from the following:   Height as of this encounter: 5\' 8"  (1.727 m).   Weight as of this encounter: 100.4 kg.   DVT prophylaxis: Eliquis Code Status: DNR, ok with pressors Family Communication: Wife at bedside Disposition Plan:  Status is: Inpatient  Remains inpatient appropriate because:Hemodynamically unstable   Dispo: The patient is from: Home              Anticipated d/c is to: to be determine              Anticipated d/c date is: 3 days              Patient currently is not medically stable to d/c.        Consultants:   CCM  Procedures:   Doppler  Antimicrobials:  Cefepime Vancomycin  Subjective: He is shivering, tachypnea. Hard of hearing.  SOB  Objective: Vitals:   07/30/20 0530 07/30/20 0558 07/30/20 0617 07/30/20 0744  BP: (!) 100/49 94/65  (!) 98/38  Pulse: 99 98  (!) 117  Resp: 20 18  18   Temp:   97.6 F (36.4 C)   TempSrc:  Oral   SpO2: 91% 97%  95%  Weight:      Height:       No intake or output data in the 24 hours ending 07/30/20 0759 Filed Weights   07/29/20 2303 07/30/20 0053  Weight: (!) 100.4 kg (!) 100.4 kg    Examination:  General exam: Appears calm and comfortable  Respiratory system: Tachypnea, BL wheezing Cardiovascular system: S1 & S2 heard, RRR. No JVD, murmurs, rubs, gallops or clicks. No pedal edema. Gastrointestinal system: Abdomen is nondistended, soft and nontender. No organomegaly or masses felt.  Normal bowel sounds heard. Central nervous system: Alert and oriented Extremities: left LE more edema than right  Data Reviewed: I have personally reviewed following labs and imaging studies  CBC: Recent Labs  Lab 07/29/20 2329  WBC 8.4  NEUTROABS 7.6  HGB 11.2*  HCT 37.0*  MCV 88.7  PLT 097*   Basic Metabolic Panel: Recent Labs  Lab 07/29/20 2329 07/30/20 0056 07/30/20 0633  NA 136  --  138  K 4.8  --  4.3  CL 106  --  108  CO2 20*  --  16*  GLUCOSE 193*  --  125*  BUN 25*  --  26*  CREATININE 1.63*  --  1.78*  CALCIUM 9.2  --  9.0  MG  --  1.5* 1.9   GFR: Estimated Creatinine Clearance: 41.8 mL/min (A) (by C-G formula based on SCr of 1.78 mg/dL (H)). Liver Function Tests: Recent Labs  Lab 07/29/20 2329  AST 42*  ALT 22  ALKPHOS 83  BILITOT 0.4  PROT 7.4  ALBUMIN 3.1*   No results for input(s): LIPASE, AMYLASE in the last 168 hours. No results for input(s): AMMONIA in the last 168 hours. Coagulation Profile: Recent Labs  Lab 07/29/20 2329  INR 1.3*   Cardiac Enzymes: No results for input(s): CKTOTAL, CKMB, CKMBINDEX, TROPONINI in the last 168 hours. BNP (last 3 results) No results for input(s): PROBNP in the last 8760 hours. HbA1C: Recent Labs    07/30/20 0633  HGBA1C 5.5   CBG: No results for input(s): GLUCAP in the last 168 hours. Lipid Profile: No results for input(s): CHOL, HDL, LDLCALC, TRIG, CHOLHDL, LDLDIRECT in the last 72 hours. Thyroid Function Tests: No results for input(s): TSH, T4TOTAL, FREET4, T3FREE, THYROIDAB in the last 72 hours. Anemia Panel: No results for input(s): VITAMINB12, FOLATE, FERRITIN, TIBC, IRON, RETICCTPCT in the last 72 hours. Sepsis Labs: Recent Labs  Lab 07/30/20 0210 07/30/20 3532  LATICACIDVEN 4.7* 4.4*    Recent Results (from the past 240 hour(s))  SARS Coronavirus 2 by RT PCR (hospital order, performed in [facility] hospital lab) Nasopharyngeal Nasopharyngeal Swab     Status: None   Collection  Time: 07/30/20 12:25 AM   Specimen: Nasopharyngeal Swab  Result Value Ref Range Status   SARS Coronavirus 2 NEGATIVE NEGATIVE Final    Comment: (NOTE) SARS-CoV-2 target nucleic acids are NOT DETECTED.  The SARS-CoV-2 RNA is generally detectable in upper and lower respiratory specimens during the acute phase of infection. The lowest concentration of SARS-CoV-2 viral copies this assay can detect is 250 copies / mL. A negative result does not preclude SARS-CoV-2 infection and should not be used as the sole basis for treatment or other patient management decisions.  A negative result may occur with improper specimen collection / handling, submission of specimen other than nasopharyngeal swab, presence of viral mutation(s) within the areas targeted by this assay, and inadequate number of viral copies (<250  copies / mL). A negative result must be combined with clinical observations, patient history, and epidemiological information.  Fact Sheet for Patients:   StrictlyIdeas.no  Fact Sheet for Healthcare Providers: BankingDealers.co.za  This test is not yet approved or  cleared by the [place] FDA and has been authorized for detection and/or diagnosis of SARS-CoV-2 by FDA under an Emergency Use Authorization (EUA).  This EUA will remain in effect (meaning this test can be used) for the duration of the COVID-19 declaration under Section 564(b)(1) of the Act, 21 U.S.C. section 360bbb-3(b)(1), unless the authorization is terminated or revoked sooner.  Performed at [facility], [place] [address], [place], [place] [address]          Radiology Studies: DG Hand 2 View Right  Result Date: 07/30/2020 CLINICAL DATA:  Wrist pain, no known injury, initial encounter EXAM: RIGHT HAND - 2 VIEW COMPARISON:  None. FINDINGS: Multiple radiopaque foreign bodies are noted adjacent to the interphalangeal joint in the thumb. Mild degenerative  changes in the interphalangeal joints are seen. Mild irregularity of the distal scaphoid is noted on the frontal film. Dedicated wrist films may be helpful. IMPRESSION: Multiple foreign bodies in the thumb of uncertain chronicity. Irregularity of the distal scaphoid on the frontal film. Correlation to point tenderness is recommended. Dedicated wrist films may be helpful. Electronically Signed   By: Inez Catalina M.D.   On: 07/30/2020 03:30   DG Chest Portable 1 View  Result Date: 07/30/2020 CLINICAL DATA:  Fever EXAM: PORTABLE CHEST 1 VIEW COMPARISON:  February 10, 2020 FINDINGS: The heart size and mediastinal contours are within normal limits. Again noted are diffuse interstitial coarsened opacities throughout both lungs, there is slight interval worsening within the right lower lung and left perihilar region. No pleural effusion is seen. No acute osseous abnormality. IMPRESSION: Diffusely increased interstitial marking which could be due to chronic lung changes with superimposed infectious etiology Electronically Signed   By: Prudencio Pair M.D.   On: 07/30/2020 00:14        Scheduled Meds: . apixaban  5 mg Oral BID  . colchicine  0.6 mg Oral BID  . diclofenac Sodium   Topical BID  . insulin aspart  0-15 Units Subcutaneous TID WC  . insulin aspart  0-5 Units Subcutaneous QHS  . insulin detemir  16 Units Subcutaneous Q2200  . ipratropium-albuterol  3 mL Inhalation Q6H  . pantoprazole  40 mg Oral Daily   Continuous Infusions: . sodium chloride 75 mL/hr at 07/30/20 0529  . ceFEPime (MAXIPIME) IV    . doxycycline (VIBRAMYCIN) IV 100 mg (07/30/20 0357)  . lactated ringers Stopped (07/30/20 0436)  . [START ON 07/31/2020] vancomycin       LOS: 0 days    Time spent: 35 minutes    Emiley Digiacomo A Dozier Berkovich, MD [facility]   If 7PM-7AM, please contact night-coverage [email or web address]  07/30/2020, 7:59 AM

## 2020-07-30 NOTE — ED Notes (Signed)
Wife at bed side and reports she is diabetic but refuses food but did accept soda. Wife has been with Pt over night.

## 2020-07-30 NOTE — Progress Notes (Addendum)
Repeat lactic acid 4.2.  Question if BP at 51/460 systolic is relative hypotension.     Plan: -add peripheral levophed -admit to ICU    Noe Gens, MSN, NP-C [facility] 07/30/2020, 1:13 PM   Please see [email or web address] for pager details.

## 2020-07-30 NOTE — ED Notes (Signed)
Pt's wife did not want this writer to wake pt to apply voltaren to rt wrist.

## 2020-07-30 NOTE — ED Notes (Signed)
Second lactic acid drawn and in the lab. Awaiting results.

## 2020-07-30 NOTE — Progress Notes (Signed)
Notified provider of need to order lactic acid. ° °

## 2020-07-30 NOTE — ED Notes (Signed)
Pt urostomy bag attached to over night bag with connector.

## 2020-07-30 NOTE — Progress Notes (Signed)
PHARMACY - PHYSICIAN COMMUNICATION CRITICAL VALUE ALERT - BLOOD CULTURE IDENTIFICATION (BCID)  Jimmy [name] is an 74 y.o. male who presented to [facility] on 07/29/2020 with a chief complaint of sepsis  Assessment:  BCID was called shown growing gram neg rods in 2/3 bottles. Source likely from urine.  (include suspected source if known)  Name of physician (or Provider) Contacted: Noe Gens   Current antibiotics: Cefepime and Vancomycin  Changes to prescribed antibiotics recommended: Cefepime and Vancomycin to Ceftriaxone 2g q24h Recommendations accepted by provider  Results for orders placed or performed during the hospital encounter of 07/29/20  Blood Culture ID Panel (Reflexed) (Collected: 07/29/2020 11:15 PM)  Result Value Ref Range   Enterococcus species NOT DETECTED NOT DETECTED   Listeria monocytogenes NOT DETECTED NOT DETECTED   Staphylococcus species NOT DETECTED NOT DETECTED   Staphylococcus aureus (BCID) NOT DETECTED NOT DETECTED   Streptococcus species NOT DETECTED NOT DETECTED   Streptococcus agalactiae NOT DETECTED NOT DETECTED   Streptococcus pneumoniae NOT DETECTED NOT DETECTED   Streptococcus pyogenes NOT DETECTED NOT DETECTED   Acinetobacter baumannii NOT DETECTED NOT DETECTED   Enterobacteriaceae species DETECTED (A) NOT DETECTED   Enterobacter cloacae complex NOT DETECTED NOT DETECTED   Escherichia coli NOT DETECTED NOT DETECTED   Klebsiella oxytoca DETECTED (A) NOT DETECTED   Klebsiella pneumoniae NOT DETECTED NOT DETECTED   Proteus species NOT DETECTED NOT DETECTED   Serratia marcescens NOT DETECTED NOT DETECTED   Carbapenem resistance NOT DETECTED NOT DETECTED   Haemophilus influenzae NOT DETECTED NOT DETECTED   Neisseria meningitidis NOT DETECTED NOT DETECTED   Pseudomonas aeruginosa NOT DETECTED NOT DETECTED   Candida albicans NOT DETECTED NOT DETECTED   Candida glabrata NOT DETECTED NOT DETECTED   Candida krusei NOT DETECTED NOT DETECTED    Candida parapsilosis NOT DETECTED NOT DETECTED   Candida tropicalis NOT DETECTED NOT DETECTED    Duanne Limerick 07/30/2020  3:45 PM

## 2020-07-30 NOTE — Progress Notes (Signed)
Lower extremity venous has been completed.   Preliminary results in CV Proc.   Abram Sander 07/30/2020 9:55 AM

## 2020-07-30 NOTE — ED Notes (Signed)
IV team at bedside 

## 2020-07-31 ENCOUNTER — Inpatient Hospital Stay (HOSPITAL_COMMUNITY): Payer: Medicare PPO

## 2020-07-31 DIAGNOSIS — L8990 Pressure ulcer of unspecified site, unspecified stage: Secondary | ICD-10-CM | POA: Insufficient documentation

## 2020-07-31 DIAGNOSIS — R6520 Severe sepsis without septic shock: Secondary | ICD-10-CM

## 2020-07-31 LAB — GLUCOSE, CAPILLARY
Glucose-Capillary: 100 mg/dL — ABNORMAL HIGH (ref 70–99)
Glucose-Capillary: 122 mg/dL — ABNORMAL HIGH (ref 70–99)
Glucose-Capillary: 128 mg/dL — ABNORMAL HIGH (ref 70–99)
Glucose-Capillary: 128 mg/dL — ABNORMAL HIGH (ref 70–99)
Glucose-Capillary: 97 mg/dL (ref 70–99)

## 2020-07-31 LAB — COMPREHENSIVE METABOLIC PANEL
ALT: 18 U/L (ref 0–44)
AST: 32 U/L (ref 15–41)
Albumin: 2.4 g/dL — ABNORMAL LOW (ref 3.5–5.0)
Alkaline Phosphatase: 73 U/L (ref 38–126)
Anion gap: 15 (ref 5–15)
BUN: 29 mg/dL — ABNORMAL HIGH (ref 8–23)
CO2: 17 mmol/L — ABNORMAL LOW (ref 22–32)
Calcium: 8.8 mg/dL — ABNORMAL LOW (ref 8.9–10.3)
Chloride: 109 mmol/L (ref 98–111)
Creatinine, Ser: 1.71 mg/dL — ABNORMAL HIGH (ref 0.61–1.24)
GFR calc Af Amer: 45 mL/min — ABNORMAL LOW (ref >60)
GFR calc non Af Amer: 39 mL/min — ABNORMAL LOW (ref >60)
Glucose, Bld: 102 mg/dL — ABNORMAL HIGH (ref 70–99)
Potassium: 3.8 mmol/L (ref 3.5–5.1)
Sodium: 141 mmol/L (ref 135–145)
Total Bilirubin: 0.7 mg/dL (ref 0.3–1.2)
Total Protein: 6.2 g/dL — ABNORMAL LOW (ref 6.5–8.1)

## 2020-07-31 LAB — CBC
HCT: 32.5 % — ABNORMAL LOW (ref 39.0–52.0)
Hemoglobin: 10.1 g/dL — ABNORMAL LOW (ref 13.0–17.0)
MCH: 27.8 pg (ref 26.0–34.0)
MCHC: 31.1 g/dL (ref 30.0–36.0)
MCV: 89.5 fL (ref 80.0–100.0)
Platelets: 106 10*3/uL — ABNORMAL LOW (ref 150–400)
RBC: 3.63 MIL/uL — ABNORMAL LOW (ref 4.22–5.81)
RDW: 20 % — ABNORMAL HIGH (ref 11.5–15.5)
WBC: 4.5 10*3/uL (ref 4.0–10.5)
nRBC: 0 % (ref 0.0–0.2)

## 2020-07-31 LAB — LACTIC ACID, PLASMA: Lactic Acid, Venous: 3.3 mmol/L (ref 0.5–1.9)

## 2020-07-31 MED ORDER — IPRATROPIUM-ALBUTEROL 0.5-2.5 (3) MG/3ML IN SOLN: 3.0000 mL | Freq: Four times a day (QID) | RESPIRATORY_TRACT | Status: DC | PRN

## 2020-07-31 NOTE — Consult Note (Signed)
NAME:  Jimmy [name], MRN:  [id number], DOB:  [birth year], LOS: 1 ADMISSION DATE:  07/29/2020, CONSULTATION DATE:  7/31 REFERRING MD: Dr. Tyrell Antonio, CHIEF COMPLAINT:  Fever   Brief History   74 y/o M admitted 7/30 with concern for sepsis, suspected urinary source.   History of present illness   74 y/o M who presented to [facility] on 7/30 with complaints of acute onset fever.   The patient was diagnosed with [id number] high-grade urothelial carcinoma of the bladder in June 2020.  His cancer course has included TURBT with bilateral ureteral stents, neoadjuvant chemotherapy from 08/2019 through 10/2019 and ultimately a radical cystectomy with lymph node dissection in 12/2019.  While recovering from his cystectomy, he had home health visitation that was sent out in February 2021.  Unfortunately, he developed COVID after visitation from [place] per his wife.  He was admitted to [facility] for some time (she mentions he was prone and had bleeding from his stoma).  Wife reports he had difficulties with bloody urine (shows picture with dark bloody bag of urine).  He had a port but at some point was removed due to infection.  He developed pulmonary nodules in June 2021.  PET scan on 07/22/20 showed bilateral pulmonary nodules that had increased in size and FDG activity.  He was planned to start Pembrolizumab 08/08/20.  His wife reports that he has been doing very well recovering from his last year of illness.  She notes he has been using a cane / walker and working with home health PT.  His oxygen had been weaned to RA during daytime and 1.5L with exertion.    He was in his usual state of health until around 8pm on 7/30.  They ate dinner and were watching TV when he developed sudden onset shaking chills.  She checked his temp which was 101 and by the time EMS arrived, it was 103.  Initial ER work up concerning for urosepsis.  CXR unchanged from prior imaging in 02/2020 post COVID.  No diarrhea or gastric symptoms, denies cough /  SOB.  Lactic acid elevated at 4.7 > 4.4.  He received 4L IVF.  BP remained soft normal.    PCCM consulted for evaluation.  Past Medical History  COVID- in 02/2020  CAD HLD DM  Recurrent Prostate Cancer  OSA - does not wear his CPAP  Significant Hospital Events   7/30 Admit with fever, concern for sepsis  7/31 PCCM consulted   Consults:  PCCM   Procedures:     Significant Diagnostic Tests:  PET Scan 07/22/20 >> 5 suspicious lung nodules worrisome for metastatic disease, no findings of nodal metastasis or solid organ metastasis within the abdomen or pelvis   Micro Data:  COVID 7/31 >> negative Blood 7/31 >> Klebsiella oxytoca Urine 7/31 >> GNR >>   Antimicrobials:  Vanco 7/30 >> 7/31 Cefepime 7/30 >> 7/31 Ceftriaxone 7/31 >>    Interim history/subjective:  Feels better Pressors weaned off at 1900 last night Blood cultures positive as above White blood cell count improved   Objective   Blood pressure (!) 110/56, pulse 84, temperature 97.8 F (36.6 C), temperature source Oral, resp. rate 14, height 5\' 8"  (1.727 m), weight (!) 101.4 kg, SpO2 97 %.        Intake/Output Summary (Last 24 hours) at 07/31/2020 0936 Last data filed at 07/31/2020 0600 Gross per 24 hour  Intake 1614.01 ml  Output 1500 ml  Net 114.01 ml   [employer]  07/29/20 2303 07/30/20 0053 07/31/20 0000  Weight: (!) 100.4 kg (!) 100.4 kg (!) 101.4 kg    Examination: General: Elderly obese man comfortable laying in bed HEENT: Oropharynx clear, moist, decreased hearing Neuro: Awake, alert, follows commands, moves all extremities CV: Regular, heart rate 90s, no murmur PULM: Comfortable respiratory pattern, no crackles, no wheeze GI: Soft, urostomy okay, clear urine Extremities: Trace to 1+ lower extremity edema Skin: No rash  CXR 7/31 - images personally reviewed, increased interstitial markings, no organized infiltrate.  Unchanged when compared to 02/2020 imaging   Resolved Hospital Problem  list     Assessment & Plan:   Septic shock due to Klebsiella bacteremia UTI (presumed Klebsiella) -Antibiotics narrowed to ceftriaxone, continue and follow cultures for sensitivities -Follow lactic acid to ensure clearance -Norepinephrine weaned off -Plan to [facility] IV fluids -Steroids discontinued  -continue vanco, cefepime  -follow cultures -repeat lactate now, if not clearing will admit to ICU for pressors  -follow BP trend, currently remaining > 169 systolic  -NS at 67EL/FY  -assess cortisol (will not be accurate as received solumedrol, recognized this after drawn) -stop solumedrol   AKI, stable 8/1 Lactic Acidosis, anion gap improved 8/1 -Follow BMP, urine output -Should be able to discontinue bicarbonate drip 8/1 since etiology appears to be lactic acid -Renal dose medications, avoid nephrotoxins  High-grade Urothelial Carcinoma of the Bladder Urostomy  -Standard urostomy care -Monitor urine output  Anemia  Thrombocytopenia  -Follow CBC on Eliquis -transfuse for Hgb<7% or active bleeding   Hx HTN, HLD, CAD  -home metoprolol on hold, restart in the coming days as hemodynamics will allow  Hx COVID, LLE DVT (02/2020) -Continue Eliquis, tolerating  DM  -Continue sliding-scale insulin as ordered -Hold Metformin until acidosis resolves  Pulmonary Nodules, Suspected Metastatic Disease Pt planned for immunotherapy initiation 08/08/2020 -Follow-up with oncology as an outpatient  OSA  -Does not use CPAP  Best practice:  Diet: Carb modified diet  Pain/Anxiety/Delirium protocol (if indicated): n/a VAP protocol (if indicated): n/a  DVT prophylaxis: Eliquis  GI prophylaxis: PPI Glucose control: SSI  Mobility: As tolerated  Code Status: DNR  Family Communication: Patient updated in full on 8/1 Disposition to progressive care on 8/1, discussed with Dr. Tyrell Antonio  Labs   CBC: Recent Labs  Lab 07/29/20 2329 07/30/20 0633 07/31/20 0149  WBC 8.4 10.0 4.5  NEUTROABS  7.6  --   --   HGB 11.2* 11.3* 10.1*  HCT 37.0* 38.1* 32.5*  MCV 88.7 93.6 89.5  PLT 148* PLATELET CLUMPS NOTED ON SMEAR, COUNT APPEARS ADEQUATE 106*    Basic Metabolic Panel: Recent Labs  Lab 07/29/20 2329 07/30/20 0056 07/30/20 0633 07/31/20 0149  NA 136  --  138 141  K 4.8  --  4.3 3.8  CL 106  --  108 109  CO2 20*  --  16* 17*  GLUCOSE 193*  --  125* 102*  BUN 25*  --  26* 29*  CREATININE 1.63*  --  1.78* 1.71*  CALCIUM 9.2  --  9.0 8.8*  MG  --  1.5* 1.9  --    GFR: Estimated Creatinine Clearance: 43.7 mL/min (A) (by C-G formula based on SCr of 1.71 mg/dL (H)). Recent Labs  Lab 07/29/20 2329 07/30/20 0210 07/30/20 0633 07/30/20 1131 07/31/20 0149  WBC 8.4  --  10.0  --  4.5  LATICACIDVEN  --  4.7* 4.4* 4.2*  --     Liver Function Tests: Recent Labs  Lab 07/29/20 2329 07/31/20 0149  AST 42* 32  ALT 22 18  ALKPHOS 83 73  BILITOT 0.4 0.7  PROT 7.4 6.2*  ALBUMIN 3.1* 2.4*   No results for input(s): LIPASE, AMYLASE in the last 168 hours. No results for input(s): AMMONIA in the last 168 hours.  ABG No results found for: PHART, PCO2ART, PO2ART, HCO3, TCO2, ACIDBASEDEF, O2SAT   Coagulation Profile: Recent Labs  Lab 07/29/20 2329  INR 1.3*    Cardiac Enzymes: No results for input(s): CKTOTAL, CKMB, CKMBINDEX, TROPONINI in the last 168 hours.  HbA1C: Hgb A1c MFr Bld  Date/Time Value Ref Range Status  07/30/2020 06:33 AM 5.5 4.8 - 5.6 % Final    Comment:    (NOTE) Pre diabetes:          5.7%-6.4%  Diabetes:              >6.4%  Glycemic control for   <7.0% adults with diabetes   02/09/2020 04:32 AM 6.0 (H) 4.8 - 5.6 % Final    Comment:    (NOTE)         Prediabetes: 5.7 - 6.4         Diabetes: >6.4         Glycemic control for adults with diabetes: <7.0     CBG: Recent Labs  Lab 07/30/20 1338 07/30/20 1713 07/30/20 2042 07/31/20 0010 07/31/20 0758  GLUCAP 130* 192* 166* 122* 100*     Critical care time: 31 min     Baltazar Apo, MD, PhD 07/31/2020, 9:47 AM [facility] [phone] or if no answer [phone]

## 2020-07-31 NOTE — Progress Notes (Addendum)
PROGRESS NOTE    Jimmy [name]  WUJ:[id number] DOB: 06-Jul-1946 DOA: 07/29/2020 PCP: [name], MD   Brief Narrative: 74 year old with past medical history significant for prostate cancer over 10 years ago, in remission.  2 years ago diagnosed with bladder cancer.  Bladder was removed 12/20.  Patient contacted Covid and has been requiring oxygen since then.  2 weeks ago he had a PET scan which showed recurrent metastasis of bladder cancer, metastasized to his lung.  He was a scheduled for immunotherapy on 8/11. The night of admission patient developed sudden onset shivering and chills, temperature checked by EMS was 103.  Patient also complaining of shortness of breath more with activities.  Patient admitted with sepsis, febrile, lactic acidosis with lactic acid at 4.   Assessment & Plan:   Principal Problem:   Sepsis ([place]) Active Problems:   Coronary artery disease involving native coronary artery of native heart without angina pectoris   Morbid obesity ([place])   History of prostate cancer   Sleep apnea   Hypertension associated with type 2 diabetes mellitus ([place])   Hyperlipidemia associated with type 2 diabetes mellitus ([place])   Type II diabetes mellitus with complication ([place])   Malignant neoplasm of urinary bladder ([place])   Port-A-Cath in place   Bladder cancer ([place])   Pulmonary emphysema ([place])   Pressure injury of skin   1-Sepsis; secondary Klebsiella Oxytoca;  Patient presents with fever, hypotension, tachypnea, UA with more than 50 white blood cells chest x-ray with chronic lung changes superimposed pneumonia cannot be ruled out. -Patient has received 4 L of IV fluids. -Now on ceftriaxone to cover for klebsiella.  -Follow urine culture.  -Blood cultures growing klebsiella Oxytoca. -patient was transiently on levophed.  -Vitals stable. Transfer to progressive unit.    2-possible pneumonia, acute hypoxic respiratory failure on chronic He was requiring 1 or 2 L of  oxygen at home.  Currently on 4 L oxygen saturation in the 90s Bilateral wheezing, received a dose of Solu-Medrol. Stop by CCM.  Scheduled nebulizer treatments.  Right wrist pain: Will need dedicated wrist x-ray when is stable.  Left lower extremity swelling: Dopplers negative.   Continue with  Eliquis. TED hose ordered.   Acute kidney injury: Received  IV fluids. Renal function stable.  NSL   Metabolic acidosis, secondary to sepsis: received bicarb gtt.  Improved.   Hypomagnesemia: Replace  CAD native coronary artery: Bladder cancer recurrence ;per oncologist Diabetes: Continue with a sliding scale insulin Thrombocytopenia; related to acute infection monitor.   Estimated body mass index is 33.99 kg/m as calculated from the following:   Height as of this encounter: 5\' 8"  (1.727 m).   Weight as of this encounter: 101.4 kg.   DVT prophylaxis: Eliquis Code Status: DNR, ok with pressors Family Communication:  Disposition Plan:  Status is: Inpatient  Remains inpatient appropriate because:Hemodynamically unstable   Dispo: The patient is from: Home              Anticipated d/c is to: to be determine              Anticipated d/c date is: 3 days              Patient currently is not medically stable to d/c.        Consultants:   CCM  Procedures:   Doppler  Antimicrobials:  Cefepime Vancomycin  Subjective: He is alert, non toxic appearing. He is not tachypnea.  He appears improved  He  is feeling better, denies dyspnea.   Objective: Vitals:   07/31/20 0500 07/31/20 0526 07/31/20 0600 07/31/20 0700  BP: (!) 89/49  (!) 110/56   Pulse: 87  89 84  Resp: 15  16 14   Temp:  97.6 F (36.4 C)  97.8 F (36.6 C)  TempSrc:  Oral  Oral  SpO2: 97%  100% 97%  Weight:      Height:        Intake/Output Summary (Last 24 hours) at 07/31/2020 0277 Last data filed at 07/31/2020 0600 Gross per 24 hour  Intake 1864.01 ml  Output 2400 ml  Net -535.99 ml   Filed  Weights   07/29/20 2303 07/30/20 0053 07/31/20 0000  Weight: (!) 100.4 kg (!) 100.4 kg (!) 101.4 kg    Examination:  General exam; NAD Respiratory system: CTA, Normal respiratory effort Cardiovascular system: S 1, S 2 RRR Gastrointestinal system: BS present, soft, nt Central nervous system: Alert and oriented Extremities: left LE more edema than right  Data Reviewed: I have personally reviewed following labs and imaging studies  CBC: Recent Labs  Lab 07/29/20 2329 07/30/20 0633 07/31/20 0149  WBC 8.4 10.0 4.5  NEUTROABS 7.6  --   --   HGB 11.2* 11.3* 10.1*  HCT 37.0* 38.1* 32.5*  MCV 88.7 93.6 89.5  PLT 148* PLATELET CLUMPS NOTED ON SMEAR, COUNT APPEARS ADEQUATE 412*   Basic Metabolic Panel: Recent Labs  Lab 07/29/20 2329 07/30/20 0056 07/30/20 0633 07/31/20 0149  NA 136  --  138 141  K 4.8  --  4.3 3.8  CL 106  --  108 109  CO2 20*  --  16* 17*  GLUCOSE 193*  --  125* 102*  BUN 25*  --  26* 29*  CREATININE 1.63*  --  1.78* 1.71*  CALCIUM 9.2  --  9.0 8.8*  MG  --  1.5* 1.9  --    GFR: Estimated Creatinine Clearance: 43.7 mL/min (A) (by C-G formula based on SCr of 1.71 mg/dL (H)). Liver Function Tests: Recent Labs  Lab 07/29/20 2329 07/31/20 0149  AST 42* 32  ALT 22 18  ALKPHOS 83 73  BILITOT 0.4 0.7  PROT 7.4 6.2*  ALBUMIN 3.1* 2.4*   No results for input(s): LIPASE, AMYLASE in the last 168 hours. No results for input(s): AMMONIA in the last 168 hours. Coagulation Profile: Recent Labs  Lab 07/29/20 2329  INR 1.3*   Cardiac Enzymes: No results for input(s): CKTOTAL, CKMB, CKMBINDEX, TROPONINI in the last 168 hours. BNP (last 3 results) No results for input(s): PROBNP in the last 8760 hours. HbA1C: Recent Labs    07/30/20 0633  HGBA1C 5.5   CBG: Recent Labs  Lab 07/30/20 1338 07/30/20 1713 07/30/20 2042 07/31/20 0010 07/31/20 0758  GLUCAP 130* 192* 166* 122* 100*   Lipid Profile: No results for input(s): CHOL, HDL, LDLCALC, TRIG,  CHOLHDL, LDLDIRECT in the last 72 hours. Thyroid Function Tests: No results for input(s): TSH, T4TOTAL, FREET4, T3FREE, THYROIDAB in the last 72 hours. Anemia Panel: No results for input(s): VITAMINB12, FOLATE, FERRITIN, TIBC, IRON, RETICCTPCT in the last 72 hours. Sepsis Labs: Recent Labs  Lab 07/30/20 0210 07/30/20 0633 07/30/20 1131  LATICACIDVEN 4.7* 4.4* 4.2*    Recent Results (from the past 240 hour(s))  Culture, blood (Routine x 2)     Status: None (Preliminary result)   Collection Time: 07/29/20 11:15 PM   Specimen: BLOOD  Result Value Ref Range Status   Specimen Description BLOOD RIGHT  ARM  Final   Special Requests   Final    BOTTLES DRAWN AEROBIC AND ANAEROBIC Blood Culture results may not be optimal due to an excessive volume of blood received in culture bottles   Culture  Setup Time   Final    GRAM NEGATIVE RODS IN BOTH AEROBIC AND ANAEROBIC BOTTLES CRITICAL RESULT CALLED TO, READ BACK BY AND VERIFIED WITH: J. EAKINS,PHARMD 1535 07/30/2020 TRosalia Hammers Performed at [facility], [place] [address], [place], [place] [address]    Culture GRAM NEGATIVE RODS  Final   Report Status PENDING  Incomplete  Blood Culture ID Panel (Reflexed)     Status: Abnormal   Collection Time: 07/29/20 11:15 PM  Result Value Ref Range Status   Enterococcus species NOT DETECTED NOT DETECTED Final   Listeria monocytogenes NOT DETECTED NOT DETECTED Final   Staphylococcus species NOT DETECTED NOT DETECTED Final   Staphylococcus aureus (BCID) NOT DETECTED NOT DETECTED Final   Streptococcus species NOT DETECTED NOT DETECTED Final   Streptococcus agalactiae NOT DETECTED NOT DETECTED Final   Streptococcus pneumoniae NOT DETECTED NOT DETECTED Final   Streptococcus pyogenes NOT DETECTED NOT DETECTED Final   Acinetobacter baumannii NOT DETECTED NOT DETECTED Final   Enterobacteriaceae species DETECTED (A) NOT DETECTED Final    Comment: Enterobacteriaceae represent a large family of gram-negative  bacteria, not a single organism. CRITICAL RESULT CALLED TO, READ BACK BY AND VERIFIED WITH: J. EAKINS,PHARMD 1535 07/30/2020 T. TYSOR    Enterobacter cloacae complex NOT DETECTED NOT DETECTED Final   Escherichia coli NOT DETECTED NOT DETECTED Final   Klebsiella oxytoca DETECTED (A) NOT DETECTED Final    Comment: CRITICAL RESULT CALLED TO, READ BACK BY AND VERIFIED WITH: J. EAKINS,PHARMD 1535 07/30/2020 T. TYSOR    Klebsiella pneumoniae NOT DETECTED NOT DETECTED Final   Proteus species NOT DETECTED NOT DETECTED Final   Serratia marcescens NOT DETECTED NOT DETECTED Final   Carbapenem resistance NOT DETECTED NOT DETECTED Final   Haemophilus influenzae NOT DETECTED NOT DETECTED Final   Neisseria meningitidis NOT DETECTED NOT DETECTED Final   Pseudomonas aeruginosa NOT DETECTED NOT DETECTED Final   Candida albicans NOT DETECTED NOT DETECTED Final   Candida glabrata NOT DETECTED NOT DETECTED Final   Candida krusei NOT DETECTED NOT DETECTED Final   Candida parapsilosis NOT DETECTED NOT DETECTED Final   Candida tropicalis NOT DETECTED NOT DETECTED Final    Comment: Performed at [facility], [place] [address] [place], [place] [address]  Culture, blood (Routine x 2)     Status: None (Preliminary result)   Collection Time: 07/29/20 11:30 PM   Specimen: BLOOD  Result Value Ref Range Status   Specimen Description BLOOD RIGHT HAND  Final   Special Requests   Final    BOTTLES DRAWN AEROBIC ONLY Blood Culture adequate volume   Culture  Setup Time   Final    GRAM NEGATIVE RODS AEROBIC BOTTLE ONLY CRITICAL VALUE NOTED.  VALUE IS CONSISTENT WITH PREVIOUSLY REPORTED AND CALLED VALUE. Performed at [facility], [place] [address] [place], [place] [address]    Culture PENDING  Incomplete   Report Status PENDING  Incomplete  SARS Coronavirus 2 by RT PCR (hospital order, performed in [facility] hospital lab) Nasopharyngeal Nasopharyngeal Swab     Status: None   Collection Time: 07/30/20 12:25  AM   Specimen: Nasopharyngeal Swab  Result Value Ref Range Status   SARS Coronavirus 2 NEGATIVE NEGATIVE Final    Comment: (NOTE) SARS-CoV-2 target  nucleic acids are NOT DETECTED.  The SARS-CoV-2 RNA is generally detectable in upper and lower respiratory specimens during the acute phase of infection. The lowest concentration of SARS-CoV-2 viral copies this assay can detect is 250 copies / mL. A negative result does not preclude SARS-CoV-2 infection and should not be used as the sole basis for treatment or other patient management decisions.  A negative result may occur with improper specimen collection / handling, submission of specimen other than nasopharyngeal swab, presence of viral mutation(s) within the areas targeted by this assay, and inadequate number of viral copies (<250 copies / mL). A negative result must be combined with clinical observations, patient history, and epidemiological information.  Fact Sheet for Patients:   StrictlyIdeas.no  Fact Sheet for Healthcare Providers: BankingDealers.co.za  This test is not yet approved or  cleared by the [place] FDA and has been authorized for detection and/or diagnosis of SARS-CoV-2 by FDA under an Emergency Use Authorization (EUA).  This EUA will remain in effect (meaning this test can be used) for the duration of the COVID-19 declaration under Section 564(b)(1) of the Act, 21 U.S.C. section 360bbb-3(b)(1), unless the authorization is terminated or revoked sooner.  Performed at [facility], [place] [address] [place], [place] [address]   MRSA PCR Screening     Status: None   Collection Time: 07/30/20  5:12 PM   Specimen: Nasal Mucosa; Nasopharyngeal  Result Value Ref Range Status   MRSA by PCR NEGATIVE NEGATIVE Final    Comment:        The GeneXpert MRSA Assay (FDA approved for NASAL specimens only), is one component of a comprehensive MRSA  colonization surveillance program. It is not intended to diagnose MRSA infection nor to guide or monitor treatment for MRSA infections. Performed at [facility], [place] [address] [place], [place] [address]          Radiology Studies: DG Hand 2 View Right  Result Date: 07/30/2020 CLINICAL DATA:  Wrist pain, no known injury, initial encounter EXAM: RIGHT HAND - 2 VIEW COMPARISON:  None. FINDINGS: Multiple radiopaque foreign bodies are noted adjacent to the interphalangeal joint in the thumb. Mild degenerative changes in the interphalangeal joints are seen. Mild irregularity of the distal scaphoid is noted on the frontal film. Dedicated wrist films may be helpful. IMPRESSION: Multiple foreign bodies in the thumb of uncertain chronicity. Irregularity of the distal scaphoid on the frontal film. Correlation to point tenderness is recommended. Dedicated wrist films may be helpful. Electronically Signed   By: Inez Catalina M.D.   On: 07/30/2020 03:30   DG Chest Portable 1 View  Result Date: 07/30/2020 CLINICAL DATA:  Fever EXAM: PORTABLE CHEST 1 VIEW COMPARISON:  February 10, 2020 FINDINGS: The heart size and mediastinal contours are within normal limits. Again noted are diffuse interstitial coarsened opacities throughout both lungs, there is slight interval worsening within the right lower lung and left perihilar region. No pleural effusion is seen. No acute osseous abnormality. IMPRESSION: Diffusely increased interstitial marking which could be due to chronic lung changes with superimposed infectious etiology Electronically Signed   By: Prudencio Pair M.D.   On: 07/30/2020 00:14   VAS [place] LOWER EXTREMITY VENOUS (DVT)  Result Date: 07/30/2020  Lower Venous DVTStudy Indications: Edema, and Swelling.  Comparison Study: no prior Performing Technologist: Abram Sander RVS  Examination Guidelines: A complete evaluation includes B-mode imaging, spectral Doppler, color Doppler, and power Doppler as  needed of all accessible portions of each  vessel. Bilateral testing is considered an integral part of a complete examination. Limited examinations for reoccurring indications may be performed as noted. The reflux portion of the exam is performed with the patient in reverse Trendelenburg.  +-----+---------------+---------+-----------+----------+--------------+ RIGHTCompressibilityPhasicitySpontaneityPropertiesThrombus Aging +-----+---------------+---------+-----------+----------+--------------+ CFV  Full           Yes      Yes                                 +-----+---------------+---------+-----------+----------+--------------+   +---------+---------------+---------+-----------+----------+--------------+ LEFT     CompressibilityPhasicitySpontaneityPropertiesThrombus Aging +---------+---------------+---------+-----------+----------+--------------+ CFV      Full           Yes      Yes                                 +---------+---------------+---------+-----------+----------+--------------+ SFJ      Full                                                        +---------+---------------+---------+-----------+----------+--------------+ FV Prox  Full                                                        +---------+---------------+---------+-----------+----------+--------------+ FV Mid   Full                                                        +---------+---------------+---------+-----------+----------+--------------+ FV DistalFull                                                        +---------+---------------+---------+-----------+----------+--------------+ PFV      Full                                                        +---------+---------------+---------+-----------+----------+--------------+ POP      Full           Yes      Yes                                 +---------+---------------+---------+-----------+----------+--------------+ PTV       Full                                                        +---------+---------------+---------+-----------+----------+--------------+ PERO  Not visualized +---------+---------------+---------+-----------+----------+--------------+     Summary: RIGHT: - No evidence of common femoral vein obstruction.  LEFT: - There is no evidence of deep vein thrombosis in the lower extremity.  - No cystic structure found in the popliteal fossa.  *See table(s) above for measurements and observations. Electronically signed by Deitra Mayo MD on 07/30/2020 at 12:57:36 PM.    Final         Scheduled Meds: . albuterol  2.5 mg Nebulization Once  . apixaban  5 mg Oral BID  . Chlorhexidine Gluconate Cloth  6 each Topical Daily  . colchicine  0.6 mg Oral BID  . diclofenac Sodium   Topical BID  . insulin aspart  0-15 Units Subcutaneous TID WC  . insulin aspart  0-5 Units Subcutaneous QHS  . insulin detemir  16 Units Subcutaneous Q2200  . ipratropium-albuterol  3 mL Inhalation Q6H  . pantoprazole  40 mg Oral Daily   Continuous Infusions: . sodium chloride 10 mL/hr at 07/30/20 1900  . cefTRIAXone (ROCEPHIN)  IV Stopped (07/30/20 1827)  . norepinephrine (LEVOPHED) Adult infusion 3 mcg/min (07/30/20 1900)  .  sodium bicarbonate (isotonic) infusion in sterile water 75 mL/hr at 07/31/20 0600     LOS: 1 day    Time spent: 35 minutes    Lashandra Arauz A Cathline Dowen, MD [facility]   If 7PM-7AM, please contact night-coverage [email or web address]  07/31/2020, 8:28 AM

## 2020-08-01 ENCOUNTER — Inpatient Hospital Stay (HOSPITAL_COMMUNITY): Payer: Medicare PPO

## 2020-08-01 ENCOUNTER — Other Ambulatory Visit: Payer: Self-pay

## 2020-08-01 ENCOUNTER — Telehealth: Payer: Self-pay | Admitting: Family Medicine

## 2020-08-01 DIAGNOSIS — M25531 Pain in right wrist: Secondary | ICD-10-CM

## 2020-08-01 LAB — BASIC METABOLIC PANEL
Anion gap: 9 (ref 5–15)
BUN: 35 mg/dL — ABNORMAL HIGH (ref 8–23)
CO2: 24 mmol/L (ref 22–32)
Calcium: 8.5 mg/dL — ABNORMAL LOW (ref 8.9–10.3)
Chloride: 108 mmol/L (ref 98–111)
Creatinine, Ser: 1.6 mg/dL — ABNORMAL HIGH (ref 0.61–1.24)
GFR calc Af Amer: 48 mL/min — ABNORMAL LOW (ref >60)
GFR calc non Af Amer: 42 mL/min — ABNORMAL LOW (ref >60)
Glucose, Bld: 82 mg/dL (ref 70–99)
Potassium: 3.9 mmol/L (ref 3.5–5.1)
Sodium: 141 mmol/L (ref 135–145)

## 2020-08-01 LAB — CULTURE, BLOOD (ROUTINE X 2): Special Requests: ADEQUATE

## 2020-08-01 LAB — CBC
HCT: 30.6 % — ABNORMAL LOW (ref 39.0–52.0)
Hemoglobin: 9.7 g/dL — ABNORMAL LOW (ref 13.0–17.0)
MCH: 28 pg (ref 26.0–34.0)
MCHC: 31.7 g/dL (ref 30.0–36.0)
MCV: 88.2 fL (ref 80.0–100.0)
Platelets: 119 10*3/uL — ABNORMAL LOW (ref 150–400)
RBC: 3.47 MIL/uL — ABNORMAL LOW (ref 4.22–5.81)
RDW: 20.2 % — ABNORMAL HIGH (ref 11.5–15.5)
WBC: 4.8 10*3/uL (ref 4.0–10.5)
nRBC: 0 % (ref 0.0–0.2)

## 2020-08-01 LAB — GLUCOSE, CAPILLARY
Glucose-Capillary: 109 mg/dL — ABNORMAL HIGH (ref 70–99)
Glucose-Capillary: 98 mg/dL (ref 70–99)
Glucose-Capillary: 110 mg/dL — ABNORMAL HIGH (ref 70–99)
Glucose-Capillary: 130 mg/dL — ABNORMAL HIGH (ref 70–99)

## 2020-08-01 LAB — POCT GLYCOSYLATED HEMOGLOBIN (HGB A1C): Hemoglobin A1C: 5.6 % (ref 4.0–5.6)

## 2020-08-01 LAB — LACTIC ACID, PLASMA: Lactic Acid, Venous: 1.5 mmol/L (ref 0.5–1.9)

## 2020-08-01 LAB — MAGNESIUM: Magnesium: 2 mg/dL (ref 1.7–2.4)

## 2020-08-01 LAB — PHOSPHORUS: Phosphorus: 2.5 mg/dL (ref 2.5–4.6)

## 2020-08-01 MED ORDER — SODIUM CHLORIDE 0.9 % IV SOLN
2.0000 g | INTRAVENOUS | Status: DC
  Administered 2020-08-01: 2 g via INTRAVENOUS
  Filled 2020-08-01: qty 20

## 2020-08-01 MED ORDER — GERHARDT'S BUTT CREAM
TOPICAL_CREAM | Freq: Three times a day (TID) | CUTANEOUS | Status: DC
  Filled 2020-08-01 (×2): qty 1

## 2020-08-01 MED ORDER — CEFAZOLIN SODIUM-DEXTROSE 2-4 GM/100ML-% IV SOLN
2.0000 g | Freq: Three times a day (TID) | INTRAVENOUS | Status: DC
  Filled 2020-08-01: qty 100

## 2020-08-01 NOTE — Progress Notes (Addendum)
Sensitivities of klebsiella are back. Continue ceftriaxone with a stop date of 10d due to cefazolin MIC of 8.   CrCl~45 ml/min  Ceftriaxone 2g x 8 more days PO options include Augmentin or Levofloxacin at discharge  Onnie Boer, PharmD, BCIDP, AAHIVP, CPP Infectious Disease Pharmacist 08/01/2020 3:45 PM

## 2020-08-01 NOTE — Consult Note (Addendum)
[facility] Nurse ostomy consult note Patient known to [place] from the time post surgery. He is independent with assistance from his wife. Additional support from their daughter is available.  Stoma type/location: RLQ urostomy Stomal assessment/size: 1 inch round, flush with abdomen, lumen in center Peristomal assessment: intact  Treatment options for stomal/peristomal skin: skin barrier ring Output: clear yellow urine Ostomy pouching: 1pc. Convex urostomy pouch with skin barrier ring Education provided: None needed Enrolled patient in [place] program: Yes, previously  [place] nursing team will not follow routinely, but will remain available to this patient, the nursing, urology and medical teams. Patient has my contact information.  Please re-consult if needed. Thanks, Maudie Flakes, MSN, RN, [place], Arther Abbott  Pager# [phone]

## 2020-08-01 NOTE — Telephone Encounter (Signed)
Let her know that I think he probably needs to see a hand specialist.  Set that up for a week or 2 down the road which will hopefully give him enough time to get out of the hospital

## 2020-08-01 NOTE — Evaluation (Signed)
Physical Therapy Evaluation Patient Details Name: Jimmy [name] MRN: [id number] DOB: 1946-06-21 Today's Date: 08/01/2020   History of Present Illness  74yo male who suddenly experienced shaking and chills and fever. Admitted with septic shock due to UTI. PMH covid february 2021, CAD, HLD, DM, recurrent prostate CA, bladder CA, heart disease  Clinical Impression   Patient received in bed, very pleasant and willing to participate in therapy. Spouse present and provided a good amount of PLOF info. See below for mobility/assist levels- generally able to mobilize with min guard-MinA and extended time/increased effort. Due to ongoing investigation of imaging in R wrist, assumed NWB this UE during session which he was able to maintain well. SPO2 drop to 89% on room air with gait in room but able to recover well to 95-96% on RA with seated rest/PLB. Left up in recliner with all needs met, chair alarm active and spouse present. Recommend return to HHPT moving forward.    Follow Up Recommendations Home health PT;Supervision for mobility/OOB    Equipment Recommendations  Rolling walker with 5" wheels    Recommendations for Other Services       Precautions / Restrictions Precautions Precautions: Fall;Other (comment) Precaution Comments: watch O2, assumed NWB R wrist at eval Restrictions Weight Bearing Restrictions: No      Mobility  Bed Mobility Overal bed mobility: Needs Assistance Bed Mobility: Supine to Sit     Supine to sit: Supervision     General bed mobility comments: increased time and effort, use of rails  Transfers Overall transfer level: Needs assistance Equipment used: None Transfers: Sit to/from Stand;Stand Pivot Transfers Sit to Stand: Min guard Stand pivot transfers: Min guard       General transfer comment: min guard for safety, mildly unsteady but able to maintain balance well  Ambulation/Gait Ambulation/Gait assistance: Min assist Gait Distance (Feet): 20  Feet Assistive device: None Gait Pattern/deviations: Step-through pattern;Drifts right/left;Trunk flexed Gait velocity: decreased   General Gait Details: very mildly unsteady and did need light MInA to maintain balance without device but for the most part able to self correct well  Stairs            Wheelchair Mobility    Modified Rankin (Stroke Patients Only)       Balance Overall balance assessment: Needs assistance   Sitting balance-Leahy Scale: Good     Standing balance support: No upper extremity supported Standing balance-Leahy Scale: Fair                               Pertinent Vitals/Pain Pain Assessment: No/denies pain    Home Living Family/patient expects to be discharged to:: Private residence Living Arrangements: Spouse/significant other Available Help at Discharge: Family;Available 24 hours/day Type of Home: House Home Access: Stairs to enter;Ramped entrance Entrance Stairs-Rails: Right;Left;Can reach both Entrance Stairs-Number of Steps: 3 Home Layout: One level Home Equipment: Walker - 2 wheels;Grab bars - tub/shower;Cane - single point      Prior Function Level of Independence: Needs assistance   Gait / Transfers Assistance Needed: independent with devices  ADL's / Homemaking Assistance Needed: indpeendent with devices        Hand Dominance   Dominant Hand: Right    Extremity/Trunk Assessment   Upper Extremity Assessment Upper Extremity Assessment: Defer to OT evaluation    Lower Extremity Assessment Lower Extremity Assessment: Generalized weakness    Cervical / Trunk Assessment Cervical / Trunk Assessment: Normal  Communication  Communication: HOH  Cognition Arousal/Alertness: Awake/alert Behavior During Therapy: WFL for tasks assessed/performed Overall Cognitive Status: Within Functional Limits for tasks assessed                                        General Comments General comments (skin  integrity, edema, etc.): SPO2 drop to 89% with gait in room on RA, but able to recover to and maintain 95-96% SPO2 on RA with seated rest- RN informed    Exercises     Assessment/Plan    PT Assessment Patient needs continued PT services  PT Problem List Decreased strength;Obesity;Decreased activity tolerance;Decreased safety awareness;Decreased balance;Decreased mobility;Cardiopulmonary status limiting activity;Decreased coordination       PT Treatment Interventions DME instruction;Balance training;Gait training;Stair training;Functional mobility training;Patient/family education;Therapeutic activities;Therapeutic exercise    PT Goals (Current goals can be found in the Care Plan section)  Acute Rehab PT Goals Patient Stated Goal: go home and get back to HHPT PT Goal Formulation: With patient/family Time For Goal Achievement: 08/15/20 Potential to Achieve Goals: Good    Frequency Min 3X/week   Barriers to discharge        Co-evaluation               AM-PAC PT "6 Clicks" Mobility  Outcome Measure Help needed turning from your back to your side while in a flat bed without using bedrails?: None Help needed moving from lying on your back to sitting on the side of a flat bed without using bedrails?: A Little Help needed moving to and from a bed to a chair (including a wheelchair)?: A Little Help needed standing up from a chair using your arms (e.g., wheelchair or bedside chair)?: A Little Help needed to walk in hospital room?: A Little Help needed climbing 3-5 steps with a railing? : A Lot 6 Click Score: 18    End of Session Equipment Utilized During Treatment: Gait belt Activity Tolerance: Patient tolerated treatment well Patient left: in chair;with call bell/phone within reach;with chair alarm set;with family/visitor present Nurse Communication: Mobility status;Other (comment) (SPO2 during session) PT Visit Diagnosis: Unsteadiness on feet (R26.81);Muscle weakness  (generalized) (M62.81)    Time: 8251-8984 PT Time Calculation (min) (ACUTE ONLY): 24 min   Charges:   PT Evaluation $PT Eval Moderate Complexity: 1 Mod PT Treatments $Gait Training: 8-22 mins        Windell Norfolk, DPT, PN1   Supplemental Physical Therapist [place]    Pager [phone] Acute Rehab Office [phone]

## 2020-08-01 NOTE — Telephone Encounter (Signed)
LVM  advising pt of referral being put in for the hand center. [place]

## 2020-08-01 NOTE — Progress Notes (Signed)
PROGRESS NOTE    Jimmy [name]  PPJ:[id number] DOB: 03-Jul-1946 DOA: 07/29/2020 PCP: [name], MD   Brief Narrative: 74 year old with past medical history significant for prostate cancer over 10 years ago, in remission.  2 years ago diagnosed with bladder cancer.  Bladder was removed 12/20.  Patient contacted Covid and has been requiring oxygen since then.  2 weeks ago he had a PET scan which showed recurrent metastasis of bladder cancer, metastasized to his lung.  He was a scheduled for immunotherapy on 8/11. The night of admission patient developed sudden onset shivering and chills, temperature checked by EMS was 103.  Patient also complaining of shortness of breath more with activities.  Patient admitted with sepsis, febrile, lactic acidosis with lactic acid at 4.   Assessment & Plan:   Principal Problem:   Sepsis ([place]) Active Problems:   Coronary artery disease involving native coronary artery of native heart without angina pectoris   Morbid obesity ([place])   History of prostate cancer   Sleep apnea   Hypertension associated with type 2 diabetes mellitus ([place])   Hyperlipidemia associated with type 2 diabetes mellitus ([place])   Type II diabetes mellitus with complication ([place])   Malignant neoplasm of urinary bladder ([place])   Port-A-Cath in place   Bladder cancer ([place])   Pulmonary emphysema ([place])   Pressure injury of skin   1-Sepsis; secondary Klebsiella Oxytoca; Septic Shock;  Form UTI. Urostomy   Sepsis ruled in.  Patient presents with fever, hypotension, tachypnea, UA with more than 50 white blood cells chest x-ray with chronic lung changes superimposed pneumonia cannot be ruled out. -Patient has received 4 L of IV fluids. -Now on ceftriaxone to cover for klebsiella.  -Follow urine culture.  -Blood cultures growing klebsiella Oxytoca. Sensitivity pending.  -patient was transiently on levophed.  -stable.    2-Possible pneumonia, acute hypoxic respiratory failure on  chronic He was requiring 1 or 2 L of oxygen at home.  Bilateral wheezing, received a dose of Solu-Medrol. Stop by CCM.  Scheduled nebulizer treatments. On room air. He needs oxygen at HS per wife.  He has been tolerating 1 L or off oxygen if he is sitting   Right wrist pain: pain controlled.   Left lower extremity swelling: Dopplers negative.   Continue with  Eliquis. TED hose ordered.   Acute kidney injury: Cr down to 1.6  Received  IV fluids. Renal function stable.  NSL   Metabolic acidosis, secondary to sepsis: received bicarb gtt.  Improved.   Hypomagnesemia: Replace  CAD native coronary artery:  High grade urothelial carcinoma of Bladder,  recurrence ;per oncologist. Metastatic to Lung;   Diabetes: Continue with a sliding scale insulin Thrombocytopenia; related to acute infection monitor.   Estimated body mass index is 33.99 kg/m as calculated from the following:   Height as of this encounter: 5\' 8"  (1.727 m).   Weight as of this encounter: 101.4 kg.   DVT prophylaxis: Eliquis Code Status: DNR, ok with pressors Family Communication: wife at bedside Disposition Plan:  Status is: Inpatient  Remains inpatient appropriate because:Hemodynamically unstable   Dispo: The patient is from: Home              Anticipated d/c is to: to be determine              Anticipated d/c date is: 3 days              Patient currently is not medically stable to d/c.home when  blood culture sensitivity are available.         Consultants:   CCM  Procedures:   Doppler  Antimicrobials:  Cefepime Vancomycin  Subjective: He is alert, denies dyspnea. Feeling better.    Objective: Vitals:   08/01/20 0300 08/01/20 0400 08/01/20 0500 08/01/20 0600  BP: 128/77 123/69 122/83 122/76  Pulse: 81 81 76 78  Resp: 21 19 18 20   Temp:      TempSrc:      SpO2: 93% 95% 96% 94%  Weight:      Height:        Intake/Output Summary (Last 24 hours) at 08/01/2020 0715 Last data  filed at 08/01/2020 0500 Gross per 24 hour  Intake 305.53 ml  Output 2425 ml  Net -2119.47 ml   Filed Weights   07/29/20 2303 07/30/20 0053 07/31/20 0000  Weight: (!) 100.4 kg (!) 100.4 kg (!) 101.4 kg    Examination:  General exam; NAD Respiratory system: CTA Cardiovascular system: S 1, S 2 RRR Gastrointestinal system: BS present, soft, nt Central nervous system: Alert and oriented Extremities: left LE more edema than right  Data Reviewed: I have personally reviewed following labs and imaging studies  CBC: Recent Labs  Lab 07/29/20 2329 07/30/20 0633 07/31/20 0149 08/01/20 0418  WBC 8.4 10.0 4.5 4.8  NEUTROABS 7.6  --   --   --   HGB 11.2* 11.3* 10.1* 9.7*  HCT 37.0* 38.1* 32.5* 30.6*  MCV 88.7 93.6 89.5 88.2  PLT 148* PLATELET CLUMPS NOTED ON SMEAR, COUNT APPEARS ADEQUATE 106* 416*   Basic Metabolic Panel: Recent Labs  Lab 07/29/20 2329 07/30/20 0056 07/30/20 0633 07/31/20 0149 08/01/20 0418  NA 136  --  138 141 141  K 4.8  --  4.3 3.8 3.9  CL 106  --  108 109 108  CO2 20*  --  16* 17* 24  GLUCOSE 193*  --  125* 102* 82  BUN 25*  --  26* 29* 35*  CREATININE 1.63*  --  1.78* 1.71* 1.60*  CALCIUM 9.2  --  9.0 8.8* 8.5*  MG  --  1.5* 1.9  --  2.0  PHOS  --   --   --   --  2.5   GFR: Estimated Creatinine Clearance: 46.8 mL/min (A) (by C-G formula based on SCr of 1.6 mg/dL (H)). Liver Function Tests: Recent Labs  Lab 07/29/20 2329 07/31/20 0149  AST 42* 32  ALT 22 18  ALKPHOS 83 73  BILITOT 0.4 0.7  PROT 7.4 6.2*  ALBUMIN 3.1* 2.4*   No results for input(s): LIPASE, AMYLASE in the last 168 hours. No results for input(s): AMMONIA in the last 168 hours. Coagulation Profile: Recent Labs  Lab 07/29/20 2329  INR 1.3*   Cardiac Enzymes: No results for input(s): CKTOTAL, CKMB, CKMBINDEX, TROPONINI in the last 168 hours. BNP (last 3 results) No results for input(s): PROBNP in the last 8760 hours. HbA1C: Recent Labs    07/30/20 0633  HGBA1C 5.5    CBG: Recent Labs  Lab 07/31/20 0010 07/31/20 0758 07/31/20 1150 07/31/20 1603 07/31/20 2221  GLUCAP 122* 100* 128* 128* 97   Lipid Profile: No results for input(s): CHOL, HDL, LDLCALC, TRIG, CHOLHDL, LDLDIRECT in the last 72 hours. Thyroid Function Tests: No results for input(s): TSH, T4TOTAL, FREET4, T3FREE, THYROIDAB in the last 72 hours. Anemia Panel: No results for input(s): VITAMINB12, FOLATE, FERRITIN, TIBC, IRON, RETICCTPCT in the last 72 hours. Sepsis Labs: Recent Labs  Lab 07/30/20  5427 07/30/20 1131 07/31/20 1059 08/01/20 0418  LATICACIDVEN 4.4* 4.2* 3.3* 1.5    Recent Results (from the past 240 hour(s))  Culture, blood (Routine x 2)     Status: Abnormal (Preliminary result)   Collection Time: 07/29/20 11:15 PM   Specimen: BLOOD  Result Value Ref Range Status   Specimen Description BLOOD RIGHT ARM  Final   Special Requests   Final    BOTTLES DRAWN AEROBIC AND ANAEROBIC Blood Culture results may not be optimal due to an excessive volume of blood received in culture bottles   Culture  Setup Time   Final    GRAM NEGATIVE RODS IN BOTH AEROBIC AND ANAEROBIC BOTTLES CRITICAL RESULT CALLED TO, READ BACK BY AND VERIFIED WITH: J. EAKINS,PHARMD 1535 07/30/2020 T. TYSOR    Culture (A)  Final    KLEBSIELLA OXYTOCA SUSCEPTIBILITIES TO FOLLOW Performed at [facility], [place] [address], [place], [place] [address]    Report Status PENDING  Incomplete  Blood Culture ID Panel (Reflexed)     Status: Abnormal   Collection Time: 07/29/20 11:15 PM  Result Value Ref Range Status   Enterococcus species NOT DETECTED NOT DETECTED Final   Listeria monocytogenes NOT DETECTED NOT DETECTED Final   Staphylococcus species NOT DETECTED NOT DETECTED Final   Staphylococcus aureus (BCID) NOT DETECTED NOT DETECTED Final   Streptococcus species NOT DETECTED NOT DETECTED Final   Streptococcus agalactiae NOT DETECTED NOT DETECTED Final   Streptococcus pneumoniae NOT DETECTED NOT  DETECTED Final   Streptococcus pyogenes NOT DETECTED NOT DETECTED Final   Acinetobacter baumannii NOT DETECTED NOT DETECTED Final   Enterobacteriaceae species DETECTED (A) NOT DETECTED Final    Comment: Enterobacteriaceae represent a large family of gram-negative bacteria, not a single organism. CRITICAL RESULT CALLED TO, READ BACK BY AND VERIFIED WITH: J. EAKINS,PHARMD 1535 07/30/2020 T. TYSOR    Enterobacter cloacae complex NOT DETECTED NOT DETECTED Final   Escherichia coli NOT DETECTED NOT DETECTED Final   Klebsiella oxytoca DETECTED (A) NOT DETECTED Final    Comment: CRITICAL RESULT CALLED TO, READ BACK BY AND VERIFIED WITH: J. EAKINS,PHARMD 1535 07/30/2020 T. TYSOR    Klebsiella pneumoniae NOT DETECTED NOT DETECTED Final   Proteus species NOT DETECTED NOT DETECTED Final   Serratia marcescens NOT DETECTED NOT DETECTED Final   Carbapenem resistance NOT DETECTED NOT DETECTED Final   Haemophilus influenzae NOT DETECTED NOT DETECTED Final   Neisseria meningitidis NOT DETECTED NOT DETECTED Final   Pseudomonas aeruginosa NOT DETECTED NOT DETECTED Final   Candida albicans NOT DETECTED NOT DETECTED Final   Candida glabrata NOT DETECTED NOT DETECTED Final   Candida krusei NOT DETECTED NOT DETECTED Final   Candida parapsilosis NOT DETECTED NOT DETECTED Final   Candida tropicalis NOT DETECTED NOT DETECTED Final    Comment: Performed at [facility], [place] [address], [place], [place] [address]  Culture, blood (Routine x 2)     Status: Abnormal (Preliminary result)   Collection Time: 07/29/20 11:30 PM   Specimen: BLOOD  Result Value Ref Range Status   Specimen Description BLOOD RIGHT HAND  Final   Special Requests   Final    BOTTLES DRAWN AEROBIC ONLY Blood Culture adequate volume   Culture  Setup Time   Final    GRAM NEGATIVE RODS AEROBIC BOTTLE ONLY CRITICAL VALUE NOTED.  VALUE IS CONSISTENT WITH PREVIOUSLY REPORTED AND CALLED VALUE. Performed at [facility], [place]  [address] [place], [place] [address]    Culture  KLEBSIELLA OXYTOCA (A)  Final   Report Status PENDING  Incomplete  Urine culture     Status: Abnormal (Preliminary result)   Collection Time: 07/30/20 12:25 AM   Specimen: In/Out Cath Urine  Result Value Ref Range Status   Specimen Description IN/OUT CATH URINE  Final   Special Requests   Final    NONE Performed at [facility], [place] [address] [place], [place] [address]    Culture >=100,000 COLONIES/mL KLEBSIELLA OXYTOCA (A)  Final   Report Status PENDING  Incomplete  SARS Coronavirus 2 by RT PCR (hospital order, performed in [facility] hospital lab) Nasopharyngeal Nasopharyngeal Swab     Status: None   Collection Time: 07/30/20 12:25 AM   Specimen: Nasopharyngeal Swab  Result Value Ref Range Status   SARS Coronavirus 2 NEGATIVE NEGATIVE Final    Comment: (NOTE) SARS-CoV-2 target nucleic acids are NOT DETECTED.  The SARS-CoV-2 RNA is generally detectable in upper and lower respiratory specimens during the acute phase of infection. The lowest concentration of SARS-CoV-2 viral copies this assay can detect is 250 copies / mL. A negative result does not preclude SARS-CoV-2 infection and should not be used as the sole basis for treatment or other patient management decisions.  A negative result may occur with improper specimen collection / handling, submission of specimen other than nasopharyngeal swab, presence of viral mutation(s) within the areas targeted by this assay, and inadequate number of viral copies (<250 copies / mL). A negative result must be combined with clinical observations, patient history, and epidemiological information.  Fact Sheet for Patients:   StrictlyIdeas.no  Fact Sheet for Healthcare Providers: BankingDealers.co.za  This test is not yet approved or  cleared by the [place] FDA and has been authorized for detection and/or diagnosis of SARS-CoV-2 by FDA  under an Emergency Use Authorization (EUA).  This EUA will remain in effect (meaning this test can be used) for the duration of the COVID-19 declaration under Section 564(b)(1) of the Act, 21 U.S.C. section 360bbb-3(b)(1), unless the authorization is terminated or revoked sooner.  Performed at [facility], [place] [address], [place], [place] [address]   MRSA PCR Screening     Status: None   Collection Time: 07/30/20  5:12 PM   Specimen: Nasal Mucosa; Nasopharyngeal  Result Value Ref Range Status   MRSA by PCR NEGATIVE NEGATIVE Final    Comment:        The GeneXpert MRSA Assay (FDA approved for NASAL specimens only), is one component of a comprehensive MRSA colonization surveillance program. It is not intended to diagnose MRSA infection nor to guide or monitor treatment for MRSA infections. Performed at [facility], [place] [address], [place], [place] [address]          Radiology Studies: DG CHEST PORT 1 VIEW  Result Date: 07/31/2020 CLINICAL DATA:  Acute respiratory failure with hypoxia. EXAM: PORTABLE CHEST 1 VIEW COMPARISON:  07/29/2020 FINDINGS: Coarse interstitial lung opacities bilaterally are without change from the prior study, consistent with chronic interstitial fibrosis. No new lung abnormalities. No pleural effusion or pneumothorax. IMPRESSION: 1. No significant change from the previous exam. 2. Chronic interstitial fibrosis. Electronically Signed   By: Lajean Manes M.D.   On: 07/31/2020 09:07   VAS [place] LOWER EXTREMITY VENOUS (DVT)  Result Date: 07/30/2020  Lower Venous DVTStudy Indications: Edema, and Swelling.  Comparison Study: no prior Performing Technologist: Abram Sander RVS  Examination Guidelines: A complete evaluation includes B-mode imaging, spectral Doppler,  color Doppler, and power Doppler as needed of all accessible portions of each vessel. Bilateral testing is considered an integral part of a complete examination. Limited examinations for  reoccurring indications may be performed as noted. The reflux portion of the exam is performed with the patient in reverse Trendelenburg.  +-----+---------------+---------+-----------+----------+--------------+ RIGHTCompressibilityPhasicitySpontaneityPropertiesThrombus Aging +-----+---------------+---------+-----------+----------+--------------+ CFV  Full           Yes      Yes                                 +-----+---------------+---------+-----------+----------+--------------+   +---------+---------------+---------+-----------+----------+--------------+ LEFT     CompressibilityPhasicitySpontaneityPropertiesThrombus Aging +---------+---------------+---------+-----------+----------+--------------+ CFV      Full           Yes      Yes                                 +---------+---------------+---------+-----------+----------+--------------+ SFJ      Full                                                        +---------+---------------+---------+-----------+----------+--------------+ FV Prox  Full                                                        +---------+---------------+---------+-----------+----------+--------------+ FV Mid   Full                                                        +---------+---------------+---------+-----------+----------+--------------+ FV DistalFull                                                        +---------+---------------+---------+-----------+----------+--------------+ PFV      Full                                                        +---------+---------------+---------+-----------+----------+--------------+ POP      Full           Yes      Yes                                 +---------+---------------+---------+-----------+----------+--------------+ PTV      Full                                                        +---------+---------------+---------+-----------+----------+--------------+  PERO                                                  Not visualized +---------+---------------+---------+-----------+----------+--------------+     Summary: RIGHT: - No evidence of common femoral vein obstruction.  LEFT: - There is no evidence of deep vein thrombosis in the lower extremity.  - No cystic structure found in the popliteal fossa.  *See table(s) above for measurements and observations. Electronically signed by Deitra Mayo MD on 07/30/2020 at 12:57:36 PM.    Final         Scheduled Meds: . albuterol  2.5 mg Nebulization Once  . apixaban  5 mg Oral BID  . Chlorhexidine Gluconate Cloth  6 each Topical Daily  . colchicine  0.6 mg Oral BID  . diclofenac Sodium   Topical BID  . insulin aspart  0-15 Units Subcutaneous TID WC  . insulin aspart  0-5 Units Subcutaneous QHS  . insulin detemir  16 Units Subcutaneous Q2200  . pantoprazole  40 mg Oral Daily   Continuous Infusions: . sodium chloride 10 mL/hr at 07/30/20 1900  . cefTRIAXone (ROCEPHIN)  IV 2 g (07/31/20 1831)     LOS: 2 days    Time spent: 35 minutes    Earlisha Sharples A Horton [place]or[place], MD [facility]   If 7PM-7AM, please contact night-coverage [email or web address]  08/01/2020, 7:15 AM

## 2020-08-01 NOTE — Telephone Encounter (Signed)
  Jimmy [name] wanted me to make you aware that they did Jimmy [name]'s wrist xray in the hospital and supposed to send results to you. She also wanted to make you aware that he injured that wrist many years ago and has had surgeries on that wrist.

## 2020-08-02 LAB — URINE CULTURE: Culture: >=100000 — AB

## 2020-08-02 LAB — BASIC METABOLIC PANEL
Anion gap: 10 (ref 5–15)
BUN: 26 mg/dL — ABNORMAL HIGH (ref 8–23)
CO2: 24 mmol/L (ref 22–32)
Calcium: 8.6 mg/dL — ABNORMAL LOW (ref 8.9–10.3)
Chloride: 105 mmol/L (ref 98–111)
Creatinine, Ser: 1.28 mg/dL — ABNORMAL HIGH (ref 0.61–1.24)
GFR calc Af Amer: >60 mL/min (ref >60)
GFR calc non Af Amer: 55 mL/min — ABNORMAL LOW (ref >60)
Glucose, Bld: 98 mg/dL (ref 70–99)
Potassium: 3.9 mmol/L (ref 3.5–5.1)
Sodium: 139 mmol/L (ref 135–145)

## 2020-08-02 LAB — GLUCOSE, CAPILLARY
Glucose-Capillary: 112 mg/dL — ABNORMAL HIGH (ref 70–99)
Glucose-Capillary: 112 mg/dL — ABNORMAL HIGH (ref 70–99)
Glucose-Capillary: 105 mg/dL — ABNORMAL HIGH (ref 70–99)

## 2020-08-02 MED ORDER — RISAQUAD PO CAPS: 1.0000 | ORAL_CAPSULE | Freq: Every day | ORAL | 0 refills | Status: DC

## 2020-08-02 MED ORDER — SULFAMETHOXAZOLE-TRIMETHOPRIM 800-160 MG PO TABS
1.0000 | ORAL_TABLET | Freq: Two times a day (BID) | ORAL | 0 refills | Status: AC
Start: 2020-08-02 — End: 2020-08-08

## 2020-08-02 MED ORDER — CIPROFLOXACIN HCL 500 MG PO TABS
500.0000 mg | ORAL_TABLET | Freq: Two times a day (BID) | ORAL | 0 refills | Status: DC
Start: 2020-08-02 — End: 2020-08-02

## 2020-08-02 MED ORDER — SODIUM CHLORIDE 0.9 % IV SOLN
2.0000 g | INTRAVENOUS | Status: DC
  Administered 2020-08-02: 2 g via INTRAVENOUS
  Filled 2020-08-02: qty 20

## 2020-08-02 MED ORDER — RISAQUAD PO CAPS: 1.0000 | ORAL_CAPSULE | Freq: Every day | ORAL | Status: DC

## 2020-08-02 NOTE — TOC Transition Note (Signed)
Transition of Care [facility]) - CM/SW Discharge Note   Patient Details  Name: Jimmy [name] MRN: [id number] Date of Birth: March 30, 1946  Transition of Care [facility]) CM/SW Contact:  Bartholomew Crews, RN Phone Number: [phone] 08/02/2020, 11:02 AM   Clinical Narrative:     Spoke with patient and spouse at the bedside to discuss transition planning. PTA patient home with spouse, and active with [facility] for [facility] PT. HH PT orders placed by MD for resumption of services. Liaison with [facility] notified of transition home. No DME needed - verified patient already has transport chair, walker, and 3N1. No further TOC needs identified.   Final next level of care: [place] Barriers to Discharge: No Barriers Identified   Patient Goals and CMS Choice Patient states their goals for this hospitalization and ongoing recovery are:: return home CMS Medicare.gov Compare Post Acute Care list provided to:: Patient Choice offered to / list presented to : Patient, Spouse  Discharge Placement                       Discharge Plan and Services In-house Referral: NA Discharge Planning Services: CM Consult Post Acute Care Choice: Home Health          DME Arranged: N/A DME Agency: NA       HH Arranged: PT HH Agency: [facility] Date [place]: 08/02/20 Time [place]: 1059 Representative spoke with at [place]: [place] ([place]) Interventions     Readmission Risk Interventions Readmission Risk Prevention Plan 02/15/2020  Transportation Screening Complete  PCP or Specialist Appt within 3-5 Days Complete  HRI or [place] Complete  Social Work Consult for [place] Planning/Counseling Complete  Palliative Care Screening Not Complete  Palliative Care Screening Not Complete Comments Palliative consult not requested by physician  Medication Review (RN Care Manager) Referral to Pharmacy  Some recent data might be  hidden

## 2020-08-02 NOTE — Discharge Summary (Signed)
Physician Discharge Summary  Jimmy [name] QQV:[id number] DOB: 12-21-1946 DOA: 07/29/2020  PCP: [name], MD  Admit date: 07/29/2020 Discharge date: 08/02/2020  Admitted From: Home  Disposition: Home   Recommendations for Outpatient Follow-up:  1. Follow up with PCP in 1-2 weeks 2. Please obtain BMP/CBC in one week 3. Follow blood cultures results from 8/03 4. Close follow up with PCP.   Home Health: yes  Discharge Condition: Stable.  CODE STATUS: DNR Diet recommendation: Heart Healthy  Brief/Interim Summary: 74 year old with past medical history significant for prostate cancer over 10 years ago, in remission.  2 years ago diagnosed with bladder cancer.  Bladder was removed 12/20.  Patient contacted Covid and has been requiring oxygen since then.  2 weeks ago he had a PET scan which showed recurrent metastasis of bladder cancer, metastasized to his lung.  He was a scheduled for immunotherapy on 8/11. The night of admission patient developed sudden onset shivering and chills, temperature checked by EMS was 103.  Patient also complaining of shortness of breath more with activities.  Patient admitted with sepsis, febrile, lactic acidosis with lactic acid at 4.   1-Sepsis; secondary Klebsiella Oxytoca; Septic Shock;  Form UTI. Urostomy   Sepsis ruled in.  Patient presents with fever, hypotension, tachypnea, UA with more than 50 white blood cells chest x-ray with chronic lung changes superimposed pneumonia cannot be ruled out. -Patient has received 4 L of IV fluids. -Now on ceftriaxone to cover for klebsiella.  -Follow urine culture.  -Blood cultures growing klebsiella Oxytoca. plan to discharge on Bactrim for 6 more days.  -patient was transiently on levophed.  -stable.    2-Possible pneumonia, acute hypoxic respiratory failure on chronic He was requiring 1 or 2 L of oxygen at home.  Bilateral wheezing, received a dose of Solu-Medrol. Stop by CCM.  Scheduled nebulizer  treatments. On room air. He needs oxygen at HS per wife.  He has been tolerating 1 L or off oxygen if he is sitting   Right wrist pain: pain controlled.   Left lower extremity swelling: Dopplers negative.   Continue with  Eliquis. TED hose ordered.   Acute kidney injury: Cr down to 1.. improved.  Received  IV fluids. Renal function stable.  NSL   Metabolic acidosis, secondary to sepsis: received bicarb gtt.  Improved.   Hypomagnesemia: Replace  CAD native coronary artery:  High grade urothelial carcinoma of Bladder,  recurrence ;per oncologist. Metastatic to Lung;   Diabetes: Continue with a sliding scale insulin Thrombocytopenia; related to acute infection monitor.  Diarrhea . 3 episodes, small amount, loose "milk shake like". Discussed with wife option to observe overnight for any worsening diarrhea vs discharge home today.  -plan to discharge home.  -if patient develops fever, worsening Diarrhea patient will need to be evaluated.  -discussed with ID, no need for prophylaxis for c diff. Avoid cipro antibiotics.  -advised to take yogurt.   Discharge Diagnoses:  Principal Problem:   Sepsis ([place]) Active Problems:   Coronary artery disease involving native coronary artery of native heart without angina pectoris   Morbid obesity ([place])   History of prostate cancer   Sleep apnea   Hypertension associated with type 2 diabetes mellitus ([place])   Hyperlipidemia associated with type 2 diabetes mellitus ([place])   Type II diabetes mellitus with complication ([place])   Malignant neoplasm of urinary bladder ([place])   Port-A-Cath in place   Bladder cancer ([place])   Pulmonary emphysema ([facility])   Pressure injury  of skin    Discharge Instructions  Discharge Instructions    Diet - low sodium heart healthy   Complete by: As directed    Discharge wound care:   Complete by: As directed    See above   Increase activity slowly   Complete by: As directed      Allergies as of  08/02/2020      Reactions   Ace Inhibitors Cough   Cephalexin Hives, Diarrhea, Nausea Only   Tolerates Ancef, Rocephin, Cefoxitin   Crestor [rosuvastatin Calcium]    Lipitor [atorvastatin Calcium] Other (See Comments)   MUSCLE PAINS   Plavix [clopidogrel Bisulfate] Hives   Vancomycin Nausea And Vomiting   Po route caused n/v.       Medication List    TAKE these medications   Accu-Chek Softclix Lancets lancets 1 each by Other route in the morning, at noon, and at bedtime. Use as instructed   acetaminophen 325 MG tablet Commonly known as: TYLENOL Take 2 tablets (650 mg total) by mouth every 6 (six) hours as needed for mild pain or headache (fever >/= 101).   apixaban 5 MG Tabs tablet Commonly known as: ELIQUIS Take 1 tablet (5 mg total) by mouth 2 (two) times daily.   Colchicine 0.6 MG Caps Take 0.6 mg by mouth 2 (two) times daily.   Combivent Respimat 20-100 MCG/ACT Aers respimat Generic drug: Ipratropium-Albuterol USE 1 PUFF 4 TIMES A DAY RINSE MOUTH AFTER USE What changed: See the new instructions.   ferrous sulfate 325 (65 FE) MG tablet Take 325 mg by mouth daily with breakfast.   glucose blood test strip 1 each by Other route in the morning, at noon, and at bedtime. Use as instructed   Insulin Pen Needle 32G X 4 MM Misc Use for injections twice daily   Levemir FlexTouch 100 UNIT/ML FlexPen Generic drug: insulin detemir INJECT 16 UNITS TWICE A DAY What changed: See the new instructions.   metFORMIN 1000 MG tablet Commonly known as: GLUCOPHAGE Take 1,000 mg by mouth daily.   metoprolol tartrate 25 MG tablet Commonly known as: LOPRESSOR TAKE 1/2 TABLET TWICE A DAY   multivitamin with minerals Tabs tablet Take 1 tablet by mouth daily.   pantoprazole 40 MG tablet Commonly known as: PROTONIX Take 40 mg by mouth daily.   polyethylene glycol 17 g packet Commonly known as: MIRALAX / GLYCOLAX Take 17 g by mouth daily as needed for moderate constipation or  severe constipation.   sulfamethoxazole-trimethoprim 800-160 MG tablet Commonly known as: BACTRIM DS Take 1 tablet by mouth 2 (two) times daily for 6 days.            Durable Medical Equipment  (From admission, onward)         Start     Ordered   08/01/20 1430  For home use only DME Walker rolling  Once       Question Answer Comment  Walker: With [place] Wheels   Patient needs a walker to treat with the following condition Balance disorder      08/01/20 1429           Discharge Care Instructions  (From admission, onward)         Start     Ordered   08/02/20 0000  Discharge wound care:       Comments: See above   08/02/20 3154          Follow-up Information    [name], MD Follow  up in 1 week(s).   Specialty: Family Medicine Contact information: [place] [address] [phone]        Jerline Pain, MD .   Specialty: Cardiology Contact information: [phone] [place] [address] [place], [facility] Follow up.   Specialty: [place] Why: the office will call to schedule visits Contact information: [address] [place] [address] [phone]              Allergies  Allergen Reactions  . Ace Inhibitors Cough  . Cephalexin Hives, Diarrhea and Nausea Only    Tolerates Ancef, Rocephin, Cefoxitin  . Crestor [Rosuvastatin Calcium]   . Lipitor [Atorvastatin Calcium] Other (See Comments)    MUSCLE PAINS   . Plavix [Clopidogrel Bisulfate] Hives  . Vancomycin Nausea And Vomiting    Po route caused n/v.     Consultations:  CCM   Procedures/Studies: DG Wrist Complete Right  Result Date: 08/01/2020 CLINICAL DATA:  Pain EXAM: RIGHT WRIST - COMPLETE 3+ VIEW COMPARISON:  Right hand radiographs July 29, 2020 FINDINGS: Frontal, oblique, lateral, and ulnar deviation scaphoid images were obtained. No fracture or dislocation. Joint spaces appear  unremarkable. No erosive change. There is a small exostosis arising from the lateral distal scaphoid. IMPRESSION: Small exostosis arising from the lateral distal scaphoid. No fracture or dislocation. No joint space narrowing or erosion. Electronically Signed   By: Lowella Grip III M.D.   On: 08/01/2020 15:47   DG Hand 2 View Right  Result Date: 07/30/2020 CLINICAL DATA:  Wrist pain, no known injury, initial encounter EXAM: RIGHT HAND - 2 VIEW COMPARISON:  None. FINDINGS: Multiple radiopaque foreign bodies are noted adjacent to the interphalangeal joint in the thumb. Mild degenerative changes in the interphalangeal joints are seen. Mild irregularity of the distal scaphoid is noted on the frontal film. Dedicated wrist films may be helpful. IMPRESSION: Multiple foreign bodies in the thumb of uncertain chronicity. Irregularity of the distal scaphoid on the frontal film. Correlation to point tenderness is recommended. Dedicated wrist films may be helpful. Electronically Signed   By: Inez Catalina M.D.   On: 07/30/2020 03:30   NM PET Image Initial (PI) Skull Base To Thigh  Result Date: 07/22/2020 CLINICAL DATA:  Initial treatment strategy for pulmonary nodules. History of bladder cancer. EXAM: NUCLEAR MEDICINE PET SKULL BASE TO THIGH TECHNIQUE: 11.6 mCi F-18 FDG was injected intravenously. Full-ring PET imaging was performed from the skull base to thigh after the radiotracer. CT data was obtained and used for attenuation correction and anatomic localization. Fasting blood glucose: 119 mg/dl COMPARISON:  CT chest 06/13/2020 FINDINGS: Mediastinal blood pool activity: SUV max 2.59 Liver activity: SUV max NA NECK: No hypermetabolic lymph nodes in the neck. Incidental CT findings: none CHEST: No FDG avid lymph nodes. The prominent subcarinal lymph node described on the previous study measures 1.4 cm and has an SUV max of 2.59, image 77/4. Previously 1.2 cm. Pulmonary nodule within the posterior right upper lobe  measures 1.2 cm and has an SUV max of 4.04, image 21/8. Previously this measured 1.2 cm Subpleural nodule in the posterior left lower lobe measures 1.5 cm and has an SUV max of 5.11, image 35/8. Previously this measured 1.0 cm. Subpleural nodule in the posterior right lower lobe measures 1.1 cm and has an SUV max of 3.07, image 41/8. Previously this measured 1.1 cm. Subpleural nodule in the paravertebral  2 cm and has an SUV max of 6.08, image 52/8. previously this measured 1.2 cm Incidental CT findings: Moderate to severe centrilobular and paraseptal emphysema with diffuse bronchial wall thickening. Again seen is subpleural reticulation and ground-glass opacification in both lungs with a mild basilar predominance with associated traction bronchiectasis and architectural distortion. Aortic atherosclerosis. Lad coronary artery atherosclerotic calcifications noted. ABDOMEN/PELVIS: No abnormal radiotracer uptake within the liver, pancreas, spleen, or adrenal glands. No FDG avid retroperitoneal or mesenteric lymph nodes. No FDG avid pelvic or inguinal lymph nodes. Incidental CT findings: Status post Cysto prostatectomy with diverting ileal urostomy. Aortic atherosclerosis. Bilateral pelvocaliectasis noted. SKELETON: No focal hypermetabolic activity to suggest skeletal metastasis. Incidental CT findings: none IMPRESSION: 1. Again seen are 5 suspicious lung nodules involving both lungs. All of these are FDG avid and are suspicious for metastatic disease. Nodules in the posterior left lower lobe and paravertebral left lung base have increased in size in the interval. The other nodules are stable in size. 2. No findings of FDG avid nodal metastasis or solid organ metastasis within the abdomen or pelvis. 3. Aortic Atherosclerosis (ICD10-I70.0) and Emphysema (ICD10-J43.9). Coronary artery calcifications noted. Electronically Signed   By: Kerby Moors M.D.   On: 07/22/2020 15:36   DG CHEST PORT 1 VIEW  Result Date:  07/31/2020 CLINICAL DATA:  Acute respiratory failure with hypoxia. EXAM: PORTABLE CHEST 1 VIEW COMPARISON:  07/29/2020 FINDINGS: Coarse interstitial lung opacities bilaterally are without change from the prior study, consistent with chronic interstitial fibrosis. No new lung abnormalities. No pleural effusion or pneumothorax. IMPRESSION: 1. No significant change from the previous exam. 2. Chronic interstitial fibrosis. Electronically Signed   By: Lajean Manes M.D.   On: 07/31/2020 09:07   DG Chest Portable 1 View  Result Date: 07/30/2020 CLINICAL DATA:  Fever EXAM: PORTABLE CHEST 1 VIEW COMPARISON:  February 10, 2020 FINDINGS: The heart size and mediastinal contours are within normal limits. Again noted are diffuse interstitial coarsened opacities throughout both lungs, there is slight interval worsening within the right lower lung and left perihilar region. No pleural effusion is seen. No acute osseous abnormality. IMPRESSION: Diffusely increased interstitial marking which could be due to chronic lung changes with superimposed infectious etiology Electronically Signed   By: Prudencio Pair M.D.   On: 07/30/2020 00:14   VAS [place] LOWER EXTREMITY VENOUS (DVT)  Result Date: 07/30/2020  Lower Venous DVTStudy Indications: Edema, and Swelling.  Comparison Study: no prior Performing Technologist: Abram Sander RVS  Examination Guidelines: A complete evaluation includes B-mode imaging, spectral Doppler, color Doppler, and power Doppler as needed of all accessible portions of each vessel. Bilateral testing is considered an integral part of a complete examination. Limited examinations for reoccurring indications may be performed as noted. The reflux portion of the exam is performed with the patient in reverse Trendelenburg.  +-----+---------------+---------+-----------+----------+--------------+ RIGHTCompressibilityPhasicitySpontaneityPropertiesThrombus Aging  +-----+---------------+---------+-----------+----------+--------------+ CFV  Full           Yes      Yes                                 +-----+---------------+---------+-----------+----------+--------------+   +---------+---------------+---------+-----------+----------+--------------+ LEFT     CompressibilityPhasicitySpontaneityPropertiesThrombus Aging +---------+---------------+---------+-----------+----------+--------------+ CFV      Full           Yes      Yes                                 +---------+---------------+---------+-----------+----------+--------------+  SFJ      Full                                                        +---------+---------------+---------+-----------+----------+--------------+ FV Prox  Full                                                        +---------+---------------+---------+-----------+----------+--------------+ FV Mid   Full                                                        +---------+---------------+---------+-----------+----------+--------------+ FV DistalFull                                                        +---------+---------------+---------+-----------+----------+--------------+ PFV      Full                                                        +---------+---------------+---------+-----------+----------+--------------+ POP      Full           Yes      Yes                                 +---------+---------------+---------+-----------+----------+--------------+ PTV      Full                                                        +---------+---------------+---------+-----------+----------+--------------+ PERO                                                  Not visualized +---------+---------------+---------+-----------+----------+--------------+     Summary: RIGHT: - No evidence of common femoral vein obstruction.  LEFT: - There is no evidence of deep vein thrombosis in the  lower extremity.  - No cystic structure found in the popliteal fossa.  *See table(s) above for measurements and observations. Electronically signed by Deitra Mayo MD on 07/30/2020 at 12:57:36 PM.    Final      Subjective: He is alert, denies dyspnea.  Denies abdominal pain.  3 loose stool  Discharge Exam: Vitals:   08/02/20 0622 08/02/20 0908  BP: 124/73 (!) 142/78  Pulse: 77 82  Resp: 18 18  Temp: 98.2 F (36.8 C) 98 F (36.7 C)  SpO2: 99% 100%  General: Pt is alert, awake, not in acute distress Cardiovascular: RRR, S1/S2 +, no rubs, no gallops Respiratory: CTA bilaterally, no wheezing, no rhonchi Abdominal: Soft, NT, ND, bowel sounds + Extremities: no edema, no cyanosis    The results of significant diagnostics from this hospitalization (including imaging, microbiology, ancillary and laboratory) are listed below for reference.     Microbiology: Recent Results (from the past 240 hour(s))  Culture, blood (Routine x 2)     Status: Abnormal   Collection Time: 07/29/20 11:15 PM   Specimen: BLOOD  Result Value Ref Range Status   Specimen Description BLOOD RIGHT ARM  Final   Special Requests   Final    BOTTLES DRAWN AEROBIC AND ANAEROBIC Blood Culture results may not be optimal due to an excessive volume of blood received in culture bottles   Culture  Setup Time   Final    GRAM NEGATIVE RODS IN BOTH AEROBIC AND ANAEROBIC BOTTLES CRITICAL RESULT CALLED TO, READ BACK BY AND VERIFIED WITH: J. EAKINS,PHARMD 1535 07/30/2020 TRosalia Hammers Performed at [facility], [place] [address] [place], [place] [address]    Culture KLEBSIELLA OXYTOCA (A)  Final   Report Status 08/01/2020 FINAL  Final   Organism ID, Bacteria KLEBSIELLA OXYTOCA  Final      Susceptibility   Klebsiella oxytoca - MIC*    AMPICILLIN >=32 RESISTANT Resistant     CEFAZOLIN 8 SENSITIVE Sensitive     CEFEPIME <=0.12 SENSITIVE Sensitive     CEFTAZIDIME <=1 SENSITIVE Sensitive     CEFTRIAXONE <=0.25  SENSITIVE Sensitive     CIPROFLOXACIN <=0.25 SENSITIVE Sensitive     GENTAMICIN <=1 SENSITIVE Sensitive     IMIPENEM <=0.25 SENSITIVE Sensitive     TRIMETH/SULFA <=20 SENSITIVE Sensitive     AMPICILLIN/SULBACTAM 8 SENSITIVE Sensitive     PIP/TAZO <=4 SENSITIVE Sensitive     * KLEBSIELLA OXYTOCA  Blood Culture ID Panel (Reflexed)     Status: Abnormal   Collection Time: 07/29/20 11:15 PM  Result Value Ref Range Status   Enterococcus species NOT DETECTED NOT DETECTED Final   Listeria monocytogenes NOT DETECTED NOT DETECTED Final   Staphylococcus species NOT DETECTED NOT DETECTED Final   Staphylococcus aureus (BCID) NOT DETECTED NOT DETECTED Final   Streptococcus species NOT DETECTED NOT DETECTED Final   Streptococcus agalactiae NOT DETECTED NOT DETECTED Final   Streptococcus pneumoniae NOT DETECTED NOT DETECTED Final   Streptococcus pyogenes NOT DETECTED NOT DETECTED Final   Acinetobacter baumannii NOT DETECTED NOT DETECTED Final   Enterobacteriaceae species DETECTED (A) NOT DETECTED Final    Comment: Enterobacteriaceae represent a large family of gram-negative bacteria, not a single organism. CRITICAL RESULT CALLED TO, READ BACK BY AND VERIFIED WITH: J. EAKINS,PHARMD 1535 07/30/2020 T. TYSOR    Enterobacter cloacae complex NOT DETECTED NOT DETECTED Final   Escherichia coli NOT DETECTED NOT DETECTED Final   Klebsiella oxytoca DETECTED (A) NOT DETECTED Final    Comment: CRITICAL RESULT CALLED TO, READ BACK BY AND VERIFIED WITH: J. EAKINS,PHARMD 1535 07/30/2020 T. TYSOR    Klebsiella pneumoniae NOT DETECTED NOT DETECTED Final   Proteus species NOT DETECTED NOT DETECTED Final   Serratia marcescens NOT DETECTED NOT DETECTED Final   Carbapenem resistance NOT DETECTED NOT DETECTED Final   Haemophilus influenzae NOT DETECTED NOT DETECTED Final   Neisseria meningitidis NOT DETECTED NOT DETECTED Final   Pseudomonas aeruginosa NOT DETECTED NOT DETECTED Final   Candida albicans NOT DETECTED  NOT DETECTED Final   Candida glabrata NOT  DETECTED NOT DETECTED Final   Candida krusei NOT DETECTED NOT DETECTED Final   Candida parapsilosis NOT DETECTED NOT DETECTED Final   Candida tropicalis NOT DETECTED NOT DETECTED Final    Comment: Performed at [facility], [place] [address], [place], [place] [address]  Culture, blood (Routine x 2)     Status: Abnormal   Collection Time: 07/29/20 11:30 PM   Specimen: BLOOD  Result Value Ref Range Status   Specimen Description BLOOD RIGHT HAND  Final   Special Requests   Final    BOTTLES DRAWN AEROBIC ONLY Blood Culture adequate volume   Culture  Setup Time   Final    GRAM NEGATIVE RODS AEROBIC BOTTLE ONLY CRITICAL VALUE NOTED.  VALUE IS CONSISTENT WITH PREVIOUSLY REPORTED AND CALLED VALUE.    Culture (A)  Final    KLEBSIELLA OXYTOCA SUSCEPTIBILITIES PERFORMED ON PREVIOUS CULTURE WITHIN THE LAST 5 DAYS. Performed at [facility], [place] [address] [place], [place] [address]    Report Status 08/01/2020 FINAL  Final  Urine culture     Status: Abnormal   Collection Time: 07/30/20 12:25 AM   Specimen: In/Out Cath Urine  Result Value Ref Range Status   Specimen Description IN/OUT CATH URINE  Final   Special Requests   Final    NONE Performed at [facility], [place] [address] [place],  [address]    Culture (A)  Final    >=100,000 COLONIES/mL KLEBSIELLA OXYTOCA 60,000 COLONIES/mL STAPHYLOCOCCUS AUREUS    Report Status 08/02/2020 FINAL  Final   Organism ID, Bacteria KLEBSIELLA OXYTOCA (A)  Final   Organism ID, Bacteria STAPHYLOCOCCUS AUREUS (A)  Final      Susceptibility   Klebsiella oxytoca - MIC*    AMPICILLIN >=32 RESISTANT Resistant     CEFAZOLIN <=4 SENSITIVE Sensitive     CEFTRIAXONE <=0.25 SENSITIVE Sensitive     CIPROFLOXACIN <=0.25 SENSITIVE Sensitive     GENTAMICIN <=1 SENSITIVE Sensitive     IMIPENEM <=0.25 SENSITIVE Sensitive     NITROFURANTOIN 32 SENSITIVE Sensitive     TRIMETH/SULFA <=20 SENSITIVE  Sensitive     AMPICILLIN/SULBACTAM 8 SENSITIVE Sensitive     PIP/TAZO <=4 SENSITIVE Sensitive     * >=100,000 COLONIES/mL KLEBSIELLA OXYTOCA   Staphylococcus aureus - MIC*    CIPROFLOXACIN <=0.5 SENSITIVE Sensitive     GENTAMICIN <=0.5 SENSITIVE Sensitive     NITROFURANTOIN <=16 SENSITIVE Sensitive     OXACILLIN <=0.25 SENSITIVE Sensitive     TETRACYCLINE <=1 SENSITIVE Sensitive     VANCOMYCIN <=0.5 SENSITIVE Sensitive     TRIMETH/SULFA <=10 SENSITIVE Sensitive     CLINDAMYCIN <=0.25 SENSITIVE Sensitive     RIFAMPIN <=0.5 SENSITIVE Sensitive     Inducible Clindamycin NEGATIVE Sensitive     * 60,000 COLONIES/mL STAPHYLOCOCCUS AUREUS  SARS Coronavirus 2 by RT PCR (hospital order, performed in [facility] hospital lab) Nasopharyngeal Nasopharyngeal Swab     Status: None   Collection Time: 07/30/20 12:25 AM   Specimen: Nasopharyngeal Swab  Result Value Ref Range Status   SARS Coronavirus 2 NEGATIVE NEGATIVE Final    Comment: (NOTE) SARS-CoV-2 target nucleic acids are NOT DETECTED.  The SARS-CoV-2 RNA is generally detectable in upper and lower respiratory specimens during the acute phase of infection. The lowest concentration of SARS-CoV-2 viral copies this assay can detect is 250 copies / mL. A negative result does not preclude SARS-CoV-2 infection and should not be used as the sole basis for treatment or other  patient management decisions.  A negative result may occur with improper specimen collection / handling, submission of specimen other than nasopharyngeal swab, presence of viral mutation(s) within the areas targeted by this assay, and inadequate number of viral copies (<250 copies / mL). A negative result must be combined with clinical observations, patient history, and epidemiological information.  Fact Sheet for Patients:   StrictlyIdeas.no  Fact Sheet for Healthcare Providers: BankingDealers.co.za  This test is not yet  approved or  cleared by the [place] FDA and has been authorized for detection and/or diagnosis of SARS-CoV-2 by FDA under an Emergency Use Authorization (EUA).  This EUA will remain in effect (meaning this test can be used) for the duration of the COVID-19 declaration under Section 564(b)(1) of the Act, 21 U.S.C. section 360bbb-3(b)(1), unless the authorization is terminated or revoked sooner.  Performed at [facility], [place] [address] [place], [place] [address]   MRSA PCR Screening     Status: None   Collection Time: 07/30/20  5:12 PM   Specimen: Nasal Mucosa; Nasopharyngeal  Result Value Ref Range Status   MRSA by PCR NEGATIVE NEGATIVE Final    Comment:        The GeneXpert MRSA Assay (FDA approved for NASAL specimens only), is one component of a comprehensive MRSA colonization surveillance program. It is not intended to diagnose MRSA infection nor to guide or monitor treatment for MRSA infections. Performed at [facility], [place] [address], [place], [place] [address]      Labs: BNP (last 3 results) Recent Labs    02/08/20 1545 07/30/20 0633  BNP 30.2 749.4*   Basic Metabolic Panel: Recent Labs  Lab 07/29/20 2329 07/30/20 0056 07/30/20 0633 07/31/20 0149 08/01/20 0418 08/02/20 0325  NA 136  --  138 141 141 139  K 4.8  --  4.3 3.8 3.9 3.9  CL 106  --  108 109 108 105  CO2 20*  --  16* 17* 24 24  GLUCOSE 193*  --  125* 102* 82 98  BUN 25*  --  26* 29* 35* 26*  CREATININE 1.63*  --  1.78* 1.71* 1.60* 1.28*  CALCIUM 9.2  --  9.0 8.8* 8.5* 8.6*  MG  --  1.5* 1.9  --  2.0  --   PHOS  --   --   --   --  2.5  --    Liver Function Tests: Recent Labs  Lab 07/29/20 2329 07/31/20 0149  AST 42* 32  ALT 22 18  ALKPHOS 83 73  BILITOT 0.4 0.7  PROT 7.4 6.2*  ALBUMIN 3.1* 2.4*   No results for input(s): LIPASE, AMYLASE in the last 168 hours. No results for input(s): AMMONIA in the last 168 hours. CBC: Recent Labs  Lab 07/29/20 2329  07/30/20 0633 07/31/20 0149 08/01/20 0418  WBC 8.4 10.0 4.5 4.8  NEUTROABS 7.6  --   --   --   HGB 11.2* 11.3* 10.1* 9.7*  HCT 37.0* 38.1* 32.5* 30.6*  MCV 88.7 93.6 89.5 88.2  PLT 148* PLATELET CLUMPS NOTED ON SMEAR, COUNT APPEARS ADEQUATE 106* 119*   Cardiac Enzymes: No results for input(s): CKTOTAL, CKMB, CKMBINDEX, TROPONINI in the last 168 hours. BNP: Invalid input(s): POCBNP CBG: Recent Labs  Lab 08/01/20 1113 08/01/20 1631 08/01/20 2214 08/02/20 0658 08/02/20 1134  GLUCAP 98 110* 130* 112*  112* 105*   D-Dimer No results for input(s): DDIMER in the last 72 hours. Hgb A1c Recent Labs  08/01/20 1429  HGBA1C 5.6   Lipid Profile No results for input(s): CHOL, HDL, LDLCALC, TRIG, CHOLHDL, LDLDIRECT in the last 72 hours. Thyroid function studies No results for input(s): TSH, T4TOTAL, T3FREE, THYROIDAB in the last 72 hours.  Invalid input(s): FREET3 Anemia work up No results for input(s): VITAMINB12, FOLATE, FERRITIN, TIBC, IRON, RETICCTPCT in the last 72 hours. Urinalysis    Component Value Date/Time   COLORURINE YELLOW 07/30/2020 0025   APPEARANCEUR CLOUDY (A) 07/30/2020 0025   LABSPEC 1.012 07/30/2020 0025   PHURINE 7.0 07/30/2020 0025   GLUCOSEU NEGATIVE 07/30/2020 0025   HGBUR MODERATE (A) 07/30/2020 0025   BILIRUBINUR NEGATIVE 07/30/2020 0025   BILIRUBINUR n 06/08/2016 1109   KETONESUR NEGATIVE 07/30/2020 0025   PROTEINUR NEGATIVE 07/30/2020 0025   UROBILINOGEN negative 06/08/2016 1109   NITRITE NEGATIVE 07/30/2020 0025   LEUKOCYTESUR LARGE (A) 07/30/2020 0025   Sepsis Labs Invalid input(s): PROCALCITONIN,  WBC,  LACTICIDVEN Microbiology Recent Results (from the past 240 hour(s))  Culture, blood (Routine x 2)     Status: Abnormal   Collection Time: 07/29/20 11:15 PM   Specimen: BLOOD  Result Value Ref Range Status   Specimen Description BLOOD RIGHT ARM  Final   Special Requests   Final    BOTTLES DRAWN AEROBIC AND ANAEROBIC Blood Culture  results may not be optimal due to an excessive volume of blood received in culture bottles   Culture  Setup Time   Final    GRAM NEGATIVE RODS IN BOTH AEROBIC AND ANAEROBIC BOTTLES CRITICAL RESULT CALLED TO, READ BACK BY AND VERIFIED WITH: J. EAKINS,PHARMD 1535 07/30/2020 TRosalia Hammers Performed at [facility], [place] [address] [place], [place] [address]    Culture KLEBSIELLA OXYTOCA (A)  Final   Report Status 08/01/2020 FINAL  Final   Organism ID, Bacteria KLEBSIELLA OXYTOCA  Final      Susceptibility   Klebsiella oxytoca - MIC*    AMPICILLIN >=32 RESISTANT Resistant     CEFAZOLIN 8 SENSITIVE Sensitive     CEFEPIME <=0.12 SENSITIVE Sensitive     CEFTAZIDIME <=1 SENSITIVE Sensitive     CEFTRIAXONE <=0.25 SENSITIVE Sensitive     CIPROFLOXACIN <=0.25 SENSITIVE Sensitive     GENTAMICIN <=1 SENSITIVE Sensitive     IMIPENEM <=0.25 SENSITIVE Sensitive     TRIMETH/SULFA <=20 SENSITIVE Sensitive     AMPICILLIN/SULBACTAM 8 SENSITIVE Sensitive     PIP/TAZO <=4 SENSITIVE Sensitive     * KLEBSIELLA OXYTOCA  Blood Culture ID Panel (Reflexed)     Status: Abnormal   Collection Time: 07/29/20 11:15 PM  Result Value Ref Range Status   Enterococcus species NOT DETECTED NOT DETECTED Final   Listeria monocytogenes NOT DETECTED NOT DETECTED Final   Staphylococcus species NOT DETECTED NOT DETECTED Final   Staphylococcus aureus (BCID) NOT DETECTED NOT DETECTED Final   Streptococcus species NOT DETECTED NOT DETECTED Final   Streptococcus agalactiae NOT DETECTED NOT DETECTED Final   Streptococcus pneumoniae NOT DETECTED NOT DETECTED Final   Streptococcus pyogenes NOT DETECTED NOT DETECTED Final   Acinetobacter baumannii NOT DETECTED NOT DETECTED Final   Enterobacteriaceae species DETECTED (A) NOT DETECTED Final    Comment: Enterobacteriaceae represent a large family of gram-negative bacteria, not a single organism. CRITICAL RESULT CALLED TO, READ BACK BY AND VERIFIED WITH: J. EAKINS,PHARMD 1535  07/30/2020 T. TYSOR    Enterobacter cloacae complex NOT DETECTED NOT DETECTED Final   Escherichia coli NOT DETECTED NOT DETECTED Final   Klebsiella oxytoca DETECTED (A) NOT DETECTED  Final    Comment: CRITICAL RESULT CALLED TO, READ BACK BY AND VERIFIED WITH: J. EAKINS,PHARMD 1535 07/30/2020 T. TYSOR    Klebsiella pneumoniae NOT DETECTED NOT DETECTED Final   Proteus species NOT DETECTED NOT DETECTED Final   Serratia marcescens NOT DETECTED NOT DETECTED Final   Carbapenem resistance NOT DETECTED NOT DETECTED Final   Haemophilus influenzae NOT DETECTED NOT DETECTED Final   Neisseria meningitidis NOT DETECTED NOT DETECTED Final   Pseudomonas aeruginosa NOT DETECTED NOT DETECTED Final   Candida albicans NOT DETECTED NOT DETECTED Final   Candida glabrata NOT DETECTED NOT DETECTED Final   Candida krusei NOT DETECTED NOT DETECTED Final   Candida parapsilosis NOT DETECTED NOT DETECTED Final   Candida tropicalis NOT DETECTED NOT DETECTED Final    Comment: Performed at [facility], [place] [address] [place], [place] [address]  Culture, blood (Routine x 2)     Status: Abnormal   Collection Time: 07/29/20 11:30 PM   Specimen: BLOOD  Result Value Ref Range Status   Specimen Description BLOOD RIGHT HAND  Final   Special Requests   Final    BOTTLES DRAWN AEROBIC ONLY Blood Culture adequate volume   Culture  Setup Time   Final    GRAM NEGATIVE RODS AEROBIC BOTTLE ONLY CRITICAL VALUE NOTED.  VALUE IS CONSISTENT WITH PREVIOUSLY REPORTED AND CALLED VALUE.    Culture (A)  Final    KLEBSIELLA OXYTOCA SUSCEPTIBILITIES PERFORMED ON PREVIOUS CULTURE WITHIN THE LAST 5 DAYS. Performed at [facility], [place] [address] [place], [place] [address]    Report Status 08/01/2020 FINAL  Final  Urine culture     Status: Abnormal   Collection Time: 07/30/20 12:25 AM   Specimen: In/Out Cath Urine  Result Value Ref Range Status   Specimen Description IN/OUT CATH URINE  Final   Special Requests   Final     NONE Performed at [facility], [place] [address] [place], [place] [address]    Culture (A)  Final    >=100,000 COLONIES/mL KLEBSIELLA OXYTOCA 60,000 COLONIES/mL STAPHYLOCOCCUS AUREUS    Report Status 08/02/2020 FINAL  Final   Organism ID, Bacteria KLEBSIELLA OXYTOCA (A)  Final   Organism ID, Bacteria STAPHYLOCOCCUS AUREUS (A)  Final      Susceptibility   Klebsiella oxytoca - MIC*    AMPICILLIN >=32 RESISTANT Resistant     CEFAZOLIN <=4 SENSITIVE Sensitive     CEFTRIAXONE <=0.25 SENSITIVE Sensitive     CIPROFLOXACIN <=0.25 SENSITIVE Sensitive     GENTAMICIN <=1 SENSITIVE Sensitive     IMIPENEM <=0.25 SENSITIVE Sensitive     NITROFURANTOIN 32 SENSITIVE Sensitive     TRIMETH/SULFA <=20 SENSITIVE Sensitive     AMPICILLIN/SULBACTAM 8 SENSITIVE Sensitive     PIP/TAZO <=4 SENSITIVE Sensitive     * >=100,000 COLONIES/mL KLEBSIELLA OXYTOCA   Staphylococcus aureus - MIC*    CIPROFLOXACIN <=0.5 SENSITIVE Sensitive     GENTAMICIN <=0.5 SENSITIVE Sensitive     NITROFURANTOIN <=16 SENSITIVE Sensitive     OXACILLIN <=0.25 SENSITIVE Sensitive     TETRACYCLINE <=1 SENSITIVE Sensitive     VANCOMYCIN <=0.5 SENSITIVE Sensitive     TRIMETH/SULFA <=10 SENSITIVE Sensitive     CLINDAMYCIN <=0.25 SENSITIVE Sensitive     RIFAMPIN <=0.5 SENSITIVE Sensitive     Inducible Clindamycin NEGATIVE Sensitive     * 60,000 COLONIES/mL STAPHYLOCOCCUS AUREUS  SARS Coronavirus 2 by RT PCR (hospital order, performed in [facility] hospital lab) Nasopharyngeal Nasopharyngeal Swab  Status: None   Collection Time: 07/30/20 12:25 AM   Specimen: Nasopharyngeal Swab  Result Value Ref Range Status   SARS Coronavirus 2 NEGATIVE NEGATIVE Final    Comment: (NOTE) SARS-CoV-2 target nucleic acids are NOT DETECTED.  The SARS-CoV-2 RNA is generally detectable in upper and lower respiratory specimens during the acute phase of infection. The lowest concentration of SARS-CoV-2 viral copies this assay can detect is  250 copies / mL. A negative result does not preclude SARS-CoV-2 infection and should not be used as the sole basis for treatment or other patient management decisions.  A negative result may occur with improper specimen collection / handling, submission of specimen other than nasopharyngeal swab, presence of viral mutation(s) within the areas targeted by this assay, and inadequate number of viral copies (<250 copies / mL). A negative result must be combined with clinical observations, patient history, and epidemiological information.  Fact Sheet for Patients:   StrictlyIdeas.no  Fact Sheet for Healthcare Providers: BankingDealers.co.za  This test is not yet approved or  cleared by the [place] FDA and has been authorized for detection and/or diagnosis of SARS-CoV-2 by FDA under an Emergency Use Authorization (EUA).  This EUA will remain in effect (meaning this test can be used) for the duration of the COVID-19 declaration under Section 564(b)(1) of the Act, 21 U.S.C. section 360bbb-3(b)(1), unless the authorization is terminated or revoked sooner.  Performed at [facility], [place] [address] [place], [place] [address]   MRSA PCR Screening     Status: None   Collection Time: 07/30/20  5:12 PM   Specimen: Nasal Mucosa; Nasopharyngeal  Result Value Ref Range Status   MRSA by PCR NEGATIVE NEGATIVE Final    Comment:        The GeneXpert MRSA Assay (FDA approved for NASAL specimens only), is one component of a comprehensive MRSA colonization surveillance program. It is not intended to diagnose MRSA infection nor to guide or monitor treatment for MRSA infections. Performed at [facility], [place] [address], [place], [place] [address]      Time coordinating discharge: 40 minutes  SIGNED:   Elmarie Shiley, MD  [facility]

## 2020-08-02 NOTE — Evaluation (Signed)
Occupational Therapy Evaluation Patient Details Name: Jimmy [name] MRN: [id number] DOB: 20-Apr-1946 Today's Date: 08/02/2020    History of Present Illness 74yo male who suddenly experienced shaking and chills and fever. Admitted with septic shock due to UTI. PMH covid february 2021, CAD, HLD, DM, recurrent prostate CA, bladder CA, heart disease   Clinical Impression   PTA, pt lives with wife who can provide 24/7 supervision. Pt typically Modified Independent for BADLs using SPC. Prior to COVID-19 in February, pt was completely Independent with IADLs. Wife now assists with IADLs, including med mgmt due to cognitive decline. Pt presents now with diagnoses above and deficits in cardiopulmonary tolerance, strength, dynamic standing balance, and cognition. Pt requires up to Mod A for LB ADLs due to deficits. Pt required up to [place] for short distance mobility using SPC due to difficulty maintaining balance. Pt balance improved with RW use, but continued to demo posterior sway increasing fall risk. Pt also with R hand/wrist pain exacerbated by wrist flexion/extension and digit opposition with deficits in fine motor coordination and ability to grasp ADL items. Recommend HHOT to follow-up at home to maximize ADL independence, cognition during daily tasks, and assist in restoration of R wrist/hand function.     Follow Up Recommendations  Home health OT;Supervision/Assistance - 24 hour    Equipment Recommendations  None recommended by OT (has all needed DME)    Recommendations for Other Services       Precautions / Restrictions Precautions Precautions: Fall;Other (comment) Precaution Comments: watch O2 Restrictions Weight Bearing Restrictions: No      Mobility Bed Mobility Overal bed mobility: Needs Assistance Bed Mobility: Supine to Sit;Sit to Supine     Supine to sit: Supervision Sit to supine: Supervision   General bed mobility comments: Supervision with increased time, use of rails  and HOB elevated  Transfers Overall transfer level: Needs assistance Equipment used: Straight cane;Rolling walker (2 wheeled) Transfers: Sit to/from [facility] Sit to Stand: Min guard Stand pivot transfers: Min assist       General transfer comment: Min guard for sit to stand. One initial posterior LOB with pt sitting back on bed. Min A to maintain balance with dynamic standing with continued tendency for posterior sway    Balance Overall balance assessment: Needs assistance Sitting-balance support: Single extremity supported;Feet supported Sitting balance-Leahy Scale: Good   Postural control: Posterior lean Standing balance support: Single extremity supported;During functional activity Standing balance-Leahy Scale: Poor Standing balance comment: Required therapist support Min A to maintain standing balance when using cane. Improved with use of RW, but continued posterior sway                           ADL either performed or assessed with clinical judgement   ADL Overall ADL's : Needs assistance/impaired Eating/Feeding: Minimal assistance;Sitting Eating/Feeding Details (indicate cue type and reason): some difficulty with grasp suspected due to wrist limitations/discomfort. Would benefit from built up grip trial Grooming: Minimal assistance;Standing;Wash/dry face;Brushing hair Grooming Details (indicate cue type and reason): Min A to maintain standing balance at sink for task with SPC. Gradually progressed to min guard Upper Body Bathing: Minimal assistance;Sitting   Lower Body Bathing: Moderate assistance;Sit to/from stand   Upper Body Dressing : Minimal assistance;Sitting   Lower Body Dressing: Moderate assistance;Sit to/from stand Lower Body Dressing Details (indicate cue type and reason): Unable to complete socks at this time. Wife reports pt has sock aide at home or she assists  him with task Toilet Transfer: Minimal assistance;Ambulation;[facility]  [facility])   Toileting- Clothing Manipulation and Hygiene: Minimal assistance;Sit to/from stand       Functional mobility during ADLs: Minimal assistance;Cane;Cueing for sequencing;Cueing for safety General ADL Comments: Pt with decreased cardiopulmonary tolerance, strength (especially R wrist), dynamic standing balance and higher level cognitive deficits impacting ability to complete daily tasks without assistance     Vision Patient Visual Report: No change from baseline Vision Assessment?: No apparent visual deficits     Perception     Praxis      Pertinent Vitals/Pain Pain Assessment: 0-10 Pain Score: 3  Pain Location: R wrist Pain Descriptors / Indicators: Aching;Grimacing;Guarding;Sore Pain Intervention(s): Monitored during session;Limited activity within patient's tolerance;Other (comment) (educated to try wrist cockup splint, ice)     Hand Dominance Right   Extremity/Trunk Assessment Upper Extremity Assessment Upper Extremity Assessment: RUE deficits/detail RUE Deficits / Details: R wrist pain/discomfort with extension/flexion, as well as 1st-5th digit opposition. Pt reports rest and extraction eases pain RUE Coordination: decreased fine motor   Lower Extremity Assessment Lower Extremity Assessment: Defer to PT evaluation   Cervical / Trunk Assessment Cervical / Trunk Assessment: Normal   Communication Communication Communication: HOH   Cognition Arousal/Alertness: Awake/alert Behavior During Therapy: WFL for tasks assessed/performed Overall Cognitive Status: Impaired/Different from baseline Area of Impairment: Memory;Following commands;Safety/judgement;Problem solving                     Memory: Decreased short-term memory Following Commands: Follows multi-step commands inconsistently Safety/Judgement: Decreased awareness of deficits;Decreased awareness of safety   Problem Solving: Slow processing;Decreased initiation;Difficulty sequencing;Requires  verbal cues;Requires tactile cues General Comments: Pt with hx of worsening cognition since COVID diagnosis in February. Requires cues for safety, decreased ability to follow/understand complex tasks   General Comments  Pt received on 3 L O2 (reports not wearing at all times at home). Stats 96% at rest, trialed on RA with desats to 87% when flat scooting up in bed, 90% when standing at sink during ADLs, 91% at rest conversing for 10 minutes. Placed on 2 L O2    Exercises     Shoulder Instructions      Home Living Family/patient expects to be discharged to:: Private residence Living Arrangements: Spouse/significant other Available Help at Discharge: Family;Available 24 hours/day Type of Home: House Home Access: Stairs to enter;Ramped entrance Entrance Stairs-Number of Steps: 3 Entrance Stairs-Rails: Right;Left;Can reach both Home Layout: One level     Bathroom Shower/Tub: Tub/shower unit;Other (comment)   Bathroom Toilet: Handicapped height     Home Equipment: Walker - 2 wheels;Grab bars - tub/shower;Cane - single point;Tub bench          Prior Functioning/Environment Level of Independence: Needs assistance  Gait / Transfers Assistance Needed: Typically Modified Independent using SPC ADL's / Homemaking Assistance Needed: Typically able to complete ADLs without assistance. May require supervision for tub transfers. Pt's wife was completing IADLs, including med mgmt due to pt decreased cognition after COVID            OT Problem List: Decreased strength;Decreased activity tolerance;Decreased range of motion;Impaired balance (sitting and/or standing);Decreased coordination;Decreased cognition;Decreased safety awareness;Decreased knowledge of use of DME or AE;Cardiopulmonary status limiting activity      OT Treatment/Interventions: Self-care/ADL training;Therapeutic exercise;Energy conservation;DME and/or AE instruction;Therapeutic activities;Patient/family education    OT  Goals(Current goals can be found in the care plan section) Acute Rehab OT Goals Patient Stated Goal: go home, be able to go on beach trips again OT  Goal Formulation: With patient/family Time For Goal Achievement: 08/16/20 Potential to Achieve Goals: Good ADL Goals Pt Will Perform Grooming: with supervision;standing Pt Will Transfer to Toilet: with supervision;ambulating;bedside commode Pt Will Perform Toileting - Clothing Manipulation and hygiene: with modified independence;sit to/from stand Additional ADL Goal #1: Pt to perform UE HEP, focusing on R wrist strength and ROM, to improve coordination and grasp for ADLs Additional ADL Goal #2: Pt to perform multi-step tasks with 85% accuracy and minimal verbal cues to maximize ability to complete daily tasks  OT Frequency: Min 2X/week   Barriers to D/C:            Co-evaluation              AM-PAC OT "6 Clicks" Daily Activity     Outcome Measure Help from another person eating meals?: A Little Help from another person taking care of personal grooming?: A Little Help from another person toileting, which includes using toliet, bedpan, or urinal?: A Little Help from another person bathing (including washing, rinsing, drying)?: A Lot Help from another person to put on and taking off regular upper body clothing?: A Little Help from another person to put on and taking off regular lower body clothing?: A Lot 6 Click Score: 16   End of Session Equipment Utilized During Treatment: Gait belt;Oxygen;Rolling walker Nurse Communication: Mobility status  Activity Tolerance: Patient tolerated treatment well Patient left: in bed;with call bell/phone within reach;with bed alarm set;with family/visitor present  OT Visit Diagnosis: Unsteadiness on feet (R26.81);Other abnormalities of gait and mobility (R26.89);Muscle weakness (generalized) (M62.81)                Time: 5277-8242 OT Time Calculation (min): 42 min Charges:  OT General  Charges $OT Visit: 1 Visit OT Evaluation $OT Eval Moderate Complexity: 1 Mod OT Treatments $Self Care/Home Management : 8-22 mins $Therapeutic Activity: 8-22 mins  Layla Maw, OTR/L  Layla Maw 08/02/2020, 12:17 PM

## 2020-08-02 NOTE — Progress Notes (Signed)
[name] to be discharged Home per MD order. Discussed prescriptions and follow up appointments with the patient. Prescriptions given to patient; medication list explained in detail. Patient verbalized understanding.  Skin clean, dry and intact without evidence of skin break down, no evidence of skin tears noted. IV catheter discontinued intact. Site without signs and symptoms of complications. Dressing and pressure applied. Pt denies pain at the site currently. No complaints noted.  Patient free of lines, drains, and wounds.   An After Visit Summary (AVS) was printed and given to the patient. Patient escorted via wheelchair, and discharged home via private auto.  Shela Commons, RN

## 2020-08-02 NOTE — Discharge Instructions (Signed)
Follow up with PCP post hospital admission to follow on infection.

## 2020-08-03 ENCOUNTER — Telehealth: Payer: Self-pay

## 2020-08-03 NOTE — Telephone Encounter (Signed)
TC from Pt's wife stating that Pt was recently discharged from the hospital for sepsis. Pt 's wife stated that he will finish his antibiotics (BACTRIM ) on 08/07/20 and wanted to know if he will be good to go to start his treatment on 08/10/20. Informed Pt he should be ok. But will confirm with Dr. Alen Blew. Per Dr. Alen Blew Pt. Is ok to proceed with treatment. Pt.'s wife verbalized understanding. No further problems or concerns noted.

## 2020-08-03 NOTE — Telephone Encounter (Signed)
Pt. Was recently in the hospital for fever and sepsis, I got him scheduled here for his hospital f/u on 08/09/20 and went over his medications and they were reconciled.

## 2020-08-04 DIAGNOSIS — M109 Gout, unspecified: Secondary | ICD-10-CM | POA: Diagnosis not present

## 2020-08-04 DIAGNOSIS — M810 Age-related osteoporosis without current pathological fracture: Secondary | ICD-10-CM | POA: Diagnosis not present

## 2020-08-04 DIAGNOSIS — E1122 Type 2 diabetes mellitus with diabetic chronic kidney disease: Secondary | ICD-10-CM | POA: Diagnosis not present

## 2020-08-04 DIAGNOSIS — I129 Hypertensive chronic kidney disease with stage 1 through stage 4 chronic kidney disease, or unspecified chronic kidney disease: Secondary | ICD-10-CM | POA: Diagnosis not present

## 2020-08-04 DIAGNOSIS — I2510 Atherosclerotic heart disease of native coronary artery without angina pectoris: Secondary | ICD-10-CM | POA: Diagnosis not present

## 2020-08-04 DIAGNOSIS — R1312 Dysphagia, oropharyngeal phase: Secondary | ICD-10-CM | POA: Diagnosis not present

## 2020-08-04 DIAGNOSIS — E1136 Type 2 diabetes mellitus with diabetic cataract: Secondary | ICD-10-CM | POA: Diagnosis not present

## 2020-08-04 DIAGNOSIS — N1831 Chronic kidney disease, stage 3a: Secondary | ICD-10-CM | POA: Diagnosis not present

## 2020-08-04 DIAGNOSIS — C679 Malignant neoplasm of bladder, unspecified: Secondary | ICD-10-CM | POA: Diagnosis not present

## 2020-08-04 NOTE — Progress Notes (Signed)
Pharmacist Chemotherapy Monitoring - Initial Assessment    Anticipated start date: 08/10/2020   Regimen:  . Are orders appropriate based on the patient's diagnosis, regimen, and cycle? Yes . Does the plan date match the patient's scheduled date? Yes . Is the sequencing of drugs appropriate? Yes . Are the premedications appropriate for the patient's regimen? Yes . Prior Authorization for treatment is: Pending o If applicable, is the correct biosimilar selected based on the patient's insurance? not applicable  Organ Function and Labs: [name] Are dose adjustments needed based on the patient's renal function, hepatic function, or hematologic function? Yes . Are appropriate labs ordered prior to the start of patient's treatment? Yes . Other organ system assessment, if indicated: N/A . The following baseline labs, if indicated, have been ordered: pembrolizumab: baseline TSH +/- T4  Dose Assessment: . Are the drug doses appropriate? Yes . Are the following correct: o Drug concentrations Yes o IV fluid compatible with drug Yes o Administration routes Yes o Timing of therapy Yes . If applicable, does the patient have documented access for treatment and/or plans for port-a-cath placement? yes . If applicable, have lifetime cumulative doses been properly documented and assessed? not applicable Lifetime Dose Tracking  No doses have been documented on this patient for the following tracked chemicals: Doxorubicin, Epirubicin, Idarubicin, Daunorubicin, Mitoxantrone, Bleomycin, Oxaliplatin, Carboplatin, Liposomal Doxorubicin  o   Toxicity Monitoring/Prevention: . The patient has the following take home antiemetics prescribed: N/A . The patient has the following take home medications prescribed: N/A . Medication allergies and previous infusion related reactions, if applicable, have been reviewed and addressed. Yes . The patient's current medication list has been assessed for drug-drug interactions with  their chemotherapy regimen. no significant drug-drug interactions were identified on review.  Order Review: . Are the treatment plan orders signed? Yes . Is the patient scheduled to see a provider prior to their treatment? No  I verify that I have reviewed each item in the above checklist and answered each question accordingly.  Nancylee Gaines D 08/04/2020 3:09 PM

## 2020-08-07 LAB — CULTURE, BLOOD (ROUTINE X 2)
Culture: NO GROWTH
Culture: NO GROWTH
Special Requests: ADEQUATE
Special Requests: ADEQUATE

## 2020-08-08 ENCOUNTER — Other Ambulatory Visit: Payer: Self-pay

## 2020-08-08 ENCOUNTER — Telehealth: Payer: Self-pay | Admitting: Family Medicine

## 2020-08-08 DIAGNOSIS — E1122 Type 2 diabetes mellitus with diabetic chronic kidney disease: Secondary | ICD-10-CM | POA: Diagnosis not present

## 2020-08-08 DIAGNOSIS — C679 Malignant neoplasm of bladder, unspecified: Secondary | ICD-10-CM | POA: Diagnosis not present

## 2020-08-08 DIAGNOSIS — I2510 Atherosclerotic heart disease of native coronary artery without angina pectoris: Secondary | ICD-10-CM | POA: Diagnosis not present

## 2020-08-08 DIAGNOSIS — E1136 Type 2 diabetes mellitus with diabetic cataract: Secondary | ICD-10-CM | POA: Diagnosis not present

## 2020-08-08 DIAGNOSIS — M109 Gout, unspecified: Secondary | ICD-10-CM | POA: Diagnosis not present

## 2020-08-08 DIAGNOSIS — I129 Hypertensive chronic kidney disease with stage 1 through stage 4 chronic kidney disease, or unspecified chronic kidney disease: Secondary | ICD-10-CM | POA: Diagnosis not present

## 2020-08-08 DIAGNOSIS — R1312 Dysphagia, oropharyngeal phase: Secondary | ICD-10-CM | POA: Diagnosis not present

## 2020-08-08 DIAGNOSIS — M810 Age-related osteoporosis without current pathological fracture: Secondary | ICD-10-CM | POA: Diagnosis not present

## 2020-08-08 DIAGNOSIS — N1831 Chronic kidney disease, stage 3a: Secondary | ICD-10-CM | POA: Diagnosis not present

## 2020-08-08 NOTE — Telephone Encounter (Signed)
Made to change to pt chart. [place]

## 2020-08-08 NOTE — Telephone Encounter (Signed)
Pt's wife called about Levemir dose in Ed's chart, he is taking one 16 unit shot daily and wants to make sure that gets updated in his Record.

## 2020-08-08 NOTE — Telephone Encounter (Signed)
Pt wife requested med dose be change in pt chart. [place]

## 2020-08-09 ENCOUNTER — Other Ambulatory Visit: Payer: Self-pay

## 2020-08-09 ENCOUNTER — Ambulatory Visit: Payer: Medicare PPO | Admitting: Family Medicine

## 2020-08-09 ENCOUNTER — Telehealth: Payer: Self-pay

## 2020-08-09 ENCOUNTER — Encounter: Payer: Self-pay | Admitting: Family Medicine

## 2020-08-09 VITALS — BP 130/76 | HR 63 | Temp 98.4°F | Wt 220.6 lb

## 2020-08-09 DIAGNOSIS — N390 Urinary tract infection, site not specified: Secondary | ICD-10-CM

## 2020-08-09 DIAGNOSIS — Z8619 Personal history of other infectious and parasitic diseases: Secondary | ICD-10-CM

## 2020-08-09 DIAGNOSIS — C679 Malignant neoplasm of bladder, unspecified: Secondary | ICD-10-CM | POA: Diagnosis not present

## 2020-08-09 DIAGNOSIS — T83510A Infection and inflammatory reaction due to cystostomy catheter, initial encounter: Secondary | ICD-10-CM

## 2020-08-09 NOTE — Progress Notes (Signed)
   Subjective:    Patient ID: Jimmy [name], male    DOB: 15-Nov-1946, 74 y.o.   MRN: [id number]  HPI He is here for a recheck.  He was admitted on July 30 and treated for UTI/sepsis.  He apparently did grow out Klebsiella as well as staph. He was treated initially with ceftriaxone and then sent home on 6 more days of Bactrim.  He has now completed that.  They did increase his O2 levels while in the hospital but at this point he seems to be doing well.  Presently he is using oxygen on an as-needed basis.  His wife monitors his pulse ox and keep it above 92.  He has no particular complaints.  He is scheduled for immunotherapy tomorrow.  Review of Systems     Objective:   Physical Exam Alert and in no distress otherwise not examined      Assessment & Plan:  Urinary tract infection associated with cystostomy catheter, initial encounter ([place]) - Plan: CBC with Differential/Platelet, Comprehensive metabolic panel  History of sepsis - Plan: CBC with Differential/Platelet, Comprehensive metabolic panel  Malignant neoplasm of urinary bladder, unspecified site [facility] ([place] I again discussed the use of O2 therapy.  Since sedentary he can keep his pulse ox above 92 I am fine with using the oxygen on an as-needed basis especially with increased physical activity.  Apparently at night she is also checked and notes that his pulse ox again stays usually above 92. 26 minutes spent in reviewing his medical record including discharge summary and lab data as well as examination today.

## 2020-08-09 NOTE — Telephone Encounter (Signed)
[name] called office stating patient had CBC with differential and CMP drawn today at PCP office.  Wife wants to know if it is possible for [place] to use the results of those labs for his treatment on 08/10/20.  Labs should result by 08/10/20 in AM and should be viewable in Epic. Dr. Alen Blew notified, per Dr. Alen Blew, ok to use labs drawn on 08/09/20 if they are viewable. Informed wife we would call on 08/10/20 to let her and patient know if we are able to use those labs. [name] verbalized understanding and agreed to relay information to patient.

## 2020-08-10 ENCOUNTER — Other Ambulatory Visit: Payer: Self-pay

## 2020-08-10 ENCOUNTER — Inpatient Hospital Stay: Payer: Medicare PPO | Attending: Oncology

## 2020-08-10 ENCOUNTER — Inpatient Hospital Stay: Payer: Medicare PPO

## 2020-08-10 VITALS — BP 123/54 | HR 80 | Temp 98.0°F | Resp 17 | Wt 192.8 lb

## 2020-08-10 DIAGNOSIS — Z79899 Other long term (current) drug therapy: Secondary | ICD-10-CM | POA: Insufficient documentation

## 2020-08-10 DIAGNOSIS — Z9221 Personal history of antineoplastic chemotherapy: Secondary | ICD-10-CM | POA: Diagnosis not present

## 2020-08-10 DIAGNOSIS — Z5112 Encounter for antineoplastic immunotherapy: Secondary | ICD-10-CM | POA: Diagnosis not present

## 2020-08-10 DIAGNOSIS — C679 Malignant neoplasm of bladder, unspecified: Secondary | ICD-10-CM

## 2020-08-10 LAB — COMPREHENSIVE METABOLIC PANEL
ALT: 26 IU/L (ref 0–44)
AST: 36 IU/L (ref 0–40)
Albumin/Globulin Ratio: 1.1 — ABNORMAL LOW (ref 1.2–2.2)
Albumin: 3.8 g/dL (ref 3.7–4.7)
Alkaline Phosphatase: 136 IU/L — ABNORMAL HIGH (ref 48–121)
BUN/Creatinine Ratio: 17 (ref 10–24)
BUN: 22 mg/dL (ref 8–27)
Bilirubin Total: <0.2 mg/dL (ref 0.0–1.2)
CO2: 19 mmol/L — ABNORMAL LOW (ref 20–29)
Calcium: 9.7 mg/dL (ref 8.6–10.2)
Chloride: 108 mmol/L — ABNORMAL HIGH (ref 96–106)
Creatinine, Ser: 1.32 mg/dL — ABNORMAL HIGH (ref 0.76–1.27)
GFR calc Af Amer: 61 mL/min/{1.73_m2} (ref >59)
GFR calc non Af Amer: 53 mL/min/{1.73_m2} — ABNORMAL LOW (ref >59)
Globulin, Total: 3.4 g/dL (ref 1.5–4.5)
Glucose: 104 mg/dL — ABNORMAL HIGH (ref 65–99)
Potassium: 5.9 mmol/L — ABNORMAL HIGH (ref 3.5–5.2)
Sodium: 140 mmol/L (ref 134–144)
Total Protein: 7.2 g/dL (ref 6.0–8.5)

## 2020-08-10 LAB — CBC WITH DIFFERENTIAL/PLATELET
Basophils Absolute: 0 10*3/uL (ref 0.0–0.2)
Basos: 1 %
EOS (ABSOLUTE): 0.1 10*3/uL (ref 0.0–0.4)
Eos: 2 %
Hematocrit: 33.7 % — ABNORMAL LOW (ref 37.5–51.0)
Hemoglobin: 10.8 g/dL — ABNORMAL LOW (ref 13.0–17.7)
Immature Grans (Abs): 0 10*3/uL (ref 0.0–0.1)
Immature Granulocytes: 1 %
Lymphocytes Absolute: 0.6 10*3/uL — ABNORMAL LOW (ref 0.7–3.1)
Lymphs: 11 %
MCH: 28.3 pg (ref 26.6–33.0)
MCHC: 32 g/dL (ref 31.5–35.7)
MCV: 88 fL (ref 79–97)
Monocytes Absolute: 0.4 10*3/uL (ref 0.1–0.9)
Monocytes: 8 %
Neutrophils Absolute: 4.1 10*3/uL (ref 1.4–7.0)
Neutrophils: 77 %
Platelets: 234 10*3/uL (ref 150–450)
RBC: 3.82 x10E6/uL — ABNORMAL LOW (ref 4.14–5.80)
RDW: 17.2 % — ABNORMAL HIGH (ref 11.6–15.4)
WBC: 5.3 10*3/uL (ref 3.4–10.8)

## 2020-08-10 MED ORDER — SODIUM CHLORIDE 0.9 % IV SOLN
Freq: Once | INTRAVENOUS | Status: AC
  Filled 2020-08-10: qty 250

## 2020-08-10 MED ORDER — SODIUM CHLORIDE 0.9 % IV SOLN
200.0000 mg | Freq: Once | INTRAVENOUS | Status: AC
  Administered 2020-08-10: 200 mg via INTRAVENOUS
  Filled 2020-08-10: qty 8

## 2020-08-10 NOTE — Patient Instructions (Addendum)
[facility] Discharge Instructions for Patients Receiving Chemotherapy  Today you received the following chemotherapy agents Keytruda  To help prevent nausea and vomiting after your treatment, we encourage you to take your nausea medication as directed   If you develop nausea and vomiting that is not controlled by your nausea medication, call the clinic.   BELOW ARE SYMPTOMS THAT SHOULD BE REPORTED IMMEDIATELY:  *FEVER GREATER THAN 100.5 F  *CHILLS WITH OR WITHOUT FEVER  NAUSEA AND VOMITING THAT IS NOT CONTROLLED WITH YOUR NAUSEA MEDICATION  *UNUSUAL SHORTNESS OF BREATH  *UNUSUAL BRUISING OR BLEEDING  TENDERNESS IN MOUTH AND THROAT WITH OR WITHOUT PRESENCE OF ULCERS  *URINARY PROBLEMS  *BOWEL PROBLEMS  UNUSUAL RASH Items with * indicate a potential emergency and should be followed up as soon as possible.  Feel free to call the clinic should you have any questions or concerns. The clinic phone number is [phone].  Please show the CHEMO ALERT CARD at check-in to the Emergency Department and triage nurse.  Pembrolizumab injection What is this medicine? PEMBROLIZUMAB (pem broe liz ue mab) is a monoclonal antibody. It is used to treat certain types of cancer. This medicine may be used for other purposes; ask your health care provider or pharmacist if you have questions. COMMON BRAND NAME(S): Keytruda What should I tell my health care provider before I take this medicine? They need to know if you have any of these conditions:  diabetes  immune system problems  inflammatory bowel disease  liver disease  lung or breathing disease  lupus  received or scheduled to receive an organ transplant or a stem-cell transplant that uses donor stem cells  an unusual or allergic reaction to pembrolizumab, other medicines, foods, dyes, or preservatives  pregnant or trying to get pregnant  breast-feeding How should I use this medicine? This medicine is for  infusion into a vein. It is given by a health care professional in a hospital or clinic setting. A special MedGuide will be given to you before each treatment. Be sure to read this information carefully each time. Talk to your pediatrician regarding the use of this medicine in children. While this drug may be prescribed for children as Madonna Flegal as 6 months for selected conditions, precautions do apply. Overdosage: If you think you have taken too much of this medicine contact a poison control center or emergency room at once. NOTE: This medicine is only for you. Do not share this medicine with others. What if I miss a dose? It is important not to miss your dose. Call your doctor or health care professional if you are unable to keep an appointment. What may interact with this medicine? Interactions have not been studied. Give your health care provider a list of all the medicines, herbs, non-prescription drugs, or dietary supplements you use. Also tell them if you smoke, drink alcohol, or use illegal drugs. Some items may interact with your medicine. This list may not describe all possible interactions. Give your health care provider a list of all the medicines, herbs, non-prescription drugs, or dietary supplements you use. Also tell them if you smoke, drink alcohol, or use illegal drugs. Some items may interact with your medicine. What should I watch for while using this medicine? Your condition will be monitored carefully while you are receiving this medicine. You may need blood work done while you are taking this medicine. Do not become pregnant while taking this medicine or for 4 months after stopping it. Women should   inform their doctor if they wish to become pregnant or think they might be pregnant. There is a potential for serious side effects to an unborn child. Talk to your health care professional or pharmacist for more information. Do not breast-feed an infant while taking this medicine or for 4  months after the last dose. What side effects may I notice from receiving this medicine? Side effects that you should report to your doctor or health care professional as soon as possible:  allergic reactions like skin rash, itching or hives, swelling of the face, lips, or tongue  bloody or black, tarry  breathing problems  changes in vision  chest pain  chills  confusion  constipation  cough  diarrhea  dizziness or feeling faint or lightheaded  fast or irregular heartbeat  fever  flushing  joint pain  low blood counts - this medicine may decrease the number of white blood cells, red blood cells and platelets. You may be at increased risk for infections and bleeding.  muscle pain  muscle weakness  pain, tingling, numbness in the hands or feet  persistent headache  redness, blistering, peeling or loosening of the skin, including inside the mouth  signs and symptoms of high blood sugar such as dizziness; dry mouth; dry skin; fruity breath; nausea; stomach pain; increased hunger or thirst; increased urination  signs and symptoms of kidney injury like trouble passing urine or change in the amount of urine  signs and symptoms of liver injury like dark urine, light-colored stools, loss of appetite, nausea, right upper belly pain, yellowing of the eyes or skin  sweating  swollen lymph nodes  weight loss Side effects that usually do not require medical attention (report to your doctor or health care professional if they continue or are bothersome):  decreased appetite  hair loss  muscle pain  tiredness This list may not describe all possible side effects. Call your doctor for medical advice about side effects. You may report side effects to FDA at 1-800-FDA-1088. Where should I keep my medicine? This drug is given in a hospital or clinic and will not be stored at home. NOTE: This sheet is a summary. It may not cover all possible information. If you have  questions about this medicine, talk to your doctor, pharmacist, or health care provider.  2020 Elsevier/Gold Standard (2019-10-23 18:07:58)  

## 2020-08-11 ENCOUNTER — Other Ambulatory Visit: Payer: Medicare PPO

## 2020-08-11 DIAGNOSIS — R899 Unspecified abnormal finding in specimens from other organs, systems and tissues: Secondary | ICD-10-CM | POA: Diagnosis not present

## 2020-08-12 DIAGNOSIS — M109 Gout, unspecified: Secondary | ICD-10-CM | POA: Diagnosis not present

## 2020-08-12 DIAGNOSIS — E1122 Type 2 diabetes mellitus with diabetic chronic kidney disease: Secondary | ICD-10-CM | POA: Diagnosis not present

## 2020-08-12 DIAGNOSIS — M810 Age-related osteoporosis without current pathological fracture: Secondary | ICD-10-CM | POA: Diagnosis not present

## 2020-08-12 DIAGNOSIS — R1312 Dysphagia, oropharyngeal phase: Secondary | ICD-10-CM | POA: Diagnosis not present

## 2020-08-12 DIAGNOSIS — N1831 Chronic kidney disease, stage 3a: Secondary | ICD-10-CM | POA: Diagnosis not present

## 2020-08-12 DIAGNOSIS — I2510 Atherosclerotic heart disease of native coronary artery without angina pectoris: Secondary | ICD-10-CM | POA: Diagnosis not present

## 2020-08-12 DIAGNOSIS — I129 Hypertensive chronic kidney disease with stage 1 through stage 4 chronic kidney disease, or unspecified chronic kidney disease: Secondary | ICD-10-CM | POA: Diagnosis not present

## 2020-08-12 DIAGNOSIS — C679 Malignant neoplasm of bladder, unspecified: Secondary | ICD-10-CM | POA: Diagnosis not present

## 2020-08-12 DIAGNOSIS — E1136 Type 2 diabetes mellitus with diabetic cataract: Secondary | ICD-10-CM | POA: Diagnosis not present

## 2020-08-12 LAB — BASIC METABOLIC PANEL
BUN/Creatinine Ratio: 17 (ref 10–24)
BUN: 20 mg/dL (ref 8–27)
CO2: 17 mmol/L — ABNORMAL LOW (ref 20–29)
Calcium: 9.2 mg/dL (ref 8.6–10.2)
Chloride: 109 mmol/L — ABNORMAL HIGH (ref 96–106)
Creatinine, Ser: 1.18 mg/dL (ref 0.76–1.27)
GFR calc Af Amer: 70 mL/min/{1.73_m2} (ref >59)
GFR calc non Af Amer: 60 mL/min/{1.73_m2} (ref >59)
Glucose: 85 mg/dL (ref 65–99)
Potassium: 5.2 mmol/L (ref 3.5–5.2)
Sodium: 140 mmol/L (ref 134–144)

## 2020-08-14 DIAGNOSIS — J449 Chronic obstructive pulmonary disease, unspecified: Secondary | ICD-10-CM | POA: Diagnosis not present

## 2020-08-14 DIAGNOSIS — U071 COVID-19: Secondary | ICD-10-CM | POA: Diagnosis not present

## 2020-08-15 DIAGNOSIS — M818 Other osteoporosis without current pathological fracture: Secondary | ICD-10-CM | POA: Diagnosis not present

## 2020-08-15 DIAGNOSIS — T387X5S Adverse effect of androgens and anabolic congeners, sequela: Secondary | ICD-10-CM | POA: Diagnosis not present

## 2020-08-15 DIAGNOSIS — A419 Sepsis, unspecified organism: Secondary | ICD-10-CM | POA: Diagnosis not present

## 2020-08-15 DIAGNOSIS — E1122 Type 2 diabetes mellitus with diabetic chronic kidney disease: Secondary | ICD-10-CM | POA: Diagnosis not present

## 2020-08-15 DIAGNOSIS — J9601 Acute respiratory failure with hypoxia: Secondary | ICD-10-CM | POA: Diagnosis not present

## 2020-08-15 DIAGNOSIS — J439 Emphysema, unspecified: Secondary | ICD-10-CM | POA: Diagnosis not present

## 2020-08-15 DIAGNOSIS — Z936 Other artificial openings of urinary tract status: Secondary | ICD-10-CM | POA: Diagnosis not present

## 2020-08-15 DIAGNOSIS — M109 Gout, unspecified: Secondary | ICD-10-CM | POA: Diagnosis not present

## 2020-08-15 DIAGNOSIS — N1831 Chronic kidney disease, stage 3a: Secondary | ICD-10-CM | POA: Diagnosis not present

## 2020-08-15 DIAGNOSIS — Z8551 Personal history of malignant neoplasm of bladder: Secondary | ICD-10-CM | POA: Diagnosis not present

## 2020-08-15 DIAGNOSIS — I129 Hypertensive chronic kidney disease with stage 1 through stage 4 chronic kidney disease, or unspecified chronic kidney disease: Secondary | ICD-10-CM | POA: Diagnosis not present

## 2020-08-17 DIAGNOSIS — R52 Pain, unspecified: Secondary | ICD-10-CM | POA: Diagnosis not present

## 2020-08-17 DIAGNOSIS — M25831 Other specified joint disorders, right wrist: Secondary | ICD-10-CM | POA: Diagnosis not present

## 2020-08-19 ENCOUNTER — Telehealth: Payer: Self-pay

## 2020-08-19 ENCOUNTER — Other Ambulatory Visit: Payer: Self-pay | Admitting: Orthopedic Surgery

## 2020-08-19 DIAGNOSIS — M109 Gout, unspecified: Secondary | ICD-10-CM | POA: Diagnosis not present

## 2020-08-19 DIAGNOSIS — M25831 Other specified joint disorders, right wrist: Secondary | ICD-10-CM

## 2020-08-19 DIAGNOSIS — J439 Emphysema, unspecified: Secondary | ICD-10-CM | POA: Diagnosis not present

## 2020-08-19 DIAGNOSIS — J9601 Acute respiratory failure with hypoxia: Secondary | ICD-10-CM | POA: Diagnosis not present

## 2020-08-19 DIAGNOSIS — I129 Hypertensive chronic kidney disease with stage 1 through stage 4 chronic kidney disease, or unspecified chronic kidney disease: Secondary | ICD-10-CM | POA: Diagnosis not present

## 2020-08-19 DIAGNOSIS — N1831 Chronic kidney disease, stage 3a: Secondary | ICD-10-CM | POA: Diagnosis not present

## 2020-08-19 DIAGNOSIS — M818 Other osteoporosis without current pathological fracture: Secondary | ICD-10-CM | POA: Diagnosis not present

## 2020-08-19 DIAGNOSIS — A419 Sepsis, unspecified organism: Secondary | ICD-10-CM | POA: Diagnosis not present

## 2020-08-19 DIAGNOSIS — E1122 Type 2 diabetes mellitus with diabetic chronic kidney disease: Secondary | ICD-10-CM | POA: Diagnosis not present

## 2020-08-19 DIAGNOSIS — T387X5S Adverse effect of androgens and anabolic congeners, sequela: Secondary | ICD-10-CM | POA: Diagnosis not present

## 2020-08-19 NOTE — Telephone Encounter (Signed)
-----   Message from Wyatt Portela, MD sent at 08/19/2020  1:37 PM EDT ----- I agree. Thanks ----- Message ----- From: Tami Lin, RN Sent: 08/19/2020   1:29 PM EDT To: Wyatt Portela, MD  FYI-Patient had first Keytruda infusion on 08/10/20. [name] called to report patient has a small red rash across his back approx 2-3 inches. Per wife the rash is in an area where the patient cannot reach to scratch it so it is annoying. I informed the wife that a rash is a side effect from [place]. I Wife will keep an eye on the area and try OTC cortisone cream if you are ok with that.  Lanelle Bal

## 2020-08-19 NOTE — Telephone Encounter (Signed)
Spoke to patient's wife and she verbalized understanding.

## 2020-08-22 ENCOUNTER — Other Ambulatory Visit: Payer: Self-pay | Admitting: Family Medicine

## 2020-08-22 DIAGNOSIS — I129 Hypertensive chronic kidney disease with stage 1 through stage 4 chronic kidney disease, or unspecified chronic kidney disease: Secondary | ICD-10-CM | POA: Diagnosis not present

## 2020-08-22 DIAGNOSIS — M818 Other osteoporosis without current pathological fracture: Secondary | ICD-10-CM | POA: Diagnosis not present

## 2020-08-22 DIAGNOSIS — T387X5S Adverse effect of androgens and anabolic congeners, sequela: Secondary | ICD-10-CM | POA: Diagnosis not present

## 2020-08-22 DIAGNOSIS — J439 Emphysema, unspecified: Secondary | ICD-10-CM | POA: Diagnosis not present

## 2020-08-22 DIAGNOSIS — N1831 Chronic kidney disease, stage 3a: Secondary | ICD-10-CM | POA: Diagnosis not present

## 2020-08-22 DIAGNOSIS — E1122 Type 2 diabetes mellitus with diabetic chronic kidney disease: Secondary | ICD-10-CM | POA: Diagnosis not present

## 2020-08-22 DIAGNOSIS — M109 Gout, unspecified: Secondary | ICD-10-CM | POA: Diagnosis not present

## 2020-08-22 DIAGNOSIS — J9601 Acute respiratory failure with hypoxia: Secondary | ICD-10-CM | POA: Diagnosis not present

## 2020-08-22 DIAGNOSIS — A419 Sepsis, unspecified organism: Secondary | ICD-10-CM | POA: Diagnosis not present

## 2020-08-22 NOTE — Telephone Encounter (Signed)
Pt. Requesting a refill on his Levemir flextouch last apt was 08/09/20 and next apt. Is 10/28/20 not sure if this was d/c or not back on 08/08/20.

## 2020-08-24 DIAGNOSIS — J439 Emphysema, unspecified: Secondary | ICD-10-CM | POA: Diagnosis not present

## 2020-08-24 DIAGNOSIS — E1122 Type 2 diabetes mellitus with diabetic chronic kidney disease: Secondary | ICD-10-CM | POA: Diagnosis not present

## 2020-08-24 DIAGNOSIS — M109 Gout, unspecified: Secondary | ICD-10-CM | POA: Diagnosis not present

## 2020-08-24 DIAGNOSIS — N1831 Chronic kidney disease, stage 3a: Secondary | ICD-10-CM | POA: Diagnosis not present

## 2020-08-24 DIAGNOSIS — I129 Hypertensive chronic kidney disease with stage 1 through stage 4 chronic kidney disease, or unspecified chronic kidney disease: Secondary | ICD-10-CM | POA: Diagnosis not present

## 2020-08-24 DIAGNOSIS — M818 Other osteoporosis without current pathological fracture: Secondary | ICD-10-CM | POA: Diagnosis not present

## 2020-08-24 DIAGNOSIS — J9601 Acute respiratory failure with hypoxia: Secondary | ICD-10-CM | POA: Diagnosis not present

## 2020-08-24 DIAGNOSIS — A419 Sepsis, unspecified organism: Secondary | ICD-10-CM | POA: Diagnosis not present

## 2020-08-24 DIAGNOSIS — T387X5S Adverse effect of androgens and anabolic congeners, sequela: Secondary | ICD-10-CM | POA: Diagnosis not present

## 2020-08-29 DIAGNOSIS — M109 Gout, unspecified: Secondary | ICD-10-CM | POA: Diagnosis not present

## 2020-08-29 DIAGNOSIS — J9601 Acute respiratory failure with hypoxia: Secondary | ICD-10-CM | POA: Diagnosis not present

## 2020-08-29 DIAGNOSIS — M818 Other osteoporosis without current pathological fracture: Secondary | ICD-10-CM | POA: Diagnosis not present

## 2020-08-29 DIAGNOSIS — A419 Sepsis, unspecified organism: Secondary | ICD-10-CM | POA: Diagnosis not present

## 2020-08-29 DIAGNOSIS — I129 Hypertensive chronic kidney disease with stage 1 through stage 4 chronic kidney disease, or unspecified chronic kidney disease: Secondary | ICD-10-CM | POA: Diagnosis not present

## 2020-08-29 DIAGNOSIS — J439 Emphysema, unspecified: Secondary | ICD-10-CM | POA: Diagnosis not present

## 2020-08-29 DIAGNOSIS — N1831 Chronic kidney disease, stage 3a: Secondary | ICD-10-CM | POA: Diagnosis not present

## 2020-08-29 DIAGNOSIS — E1122 Type 2 diabetes mellitus with diabetic chronic kidney disease: Secondary | ICD-10-CM | POA: Diagnosis not present

## 2020-08-29 DIAGNOSIS — T387X5S Adverse effect of androgens and anabolic congeners, sequela: Secondary | ICD-10-CM | POA: Diagnosis not present

## 2020-08-31 ENCOUNTER — Inpatient Hospital Stay: Payer: Medicare PPO | Attending: Oncology

## 2020-08-31 ENCOUNTER — Inpatient Hospital Stay: Payer: Medicare PPO | Admitting: Oncology

## 2020-08-31 ENCOUNTER — Inpatient Hospital Stay: Payer: Medicare PPO

## 2020-08-31 ENCOUNTER — Other Ambulatory Visit: Payer: Self-pay

## 2020-08-31 VITALS — BP 124/58 | HR 77 | Temp 98.6°F | Resp 18 | Ht 67.0 in | Wt 226.6 lb

## 2020-08-31 DIAGNOSIS — Z79899 Other long term (current) drug therapy: Secondary | ICD-10-CM | POA: Diagnosis not present

## 2020-08-31 DIAGNOSIS — Z9221 Personal history of antineoplastic chemotherapy: Secondary | ICD-10-CM | POA: Diagnosis not present

## 2020-08-31 DIAGNOSIS — C679 Malignant neoplasm of bladder, unspecified: Secondary | ICD-10-CM | POA: Insufficient documentation

## 2020-08-31 DIAGNOSIS — Z5112 Encounter for antineoplastic immunotherapy: Secondary | ICD-10-CM | POA: Diagnosis not present

## 2020-08-31 LAB — CMP (CANCER CENTER ONLY)
ALT: 21 U/L (ref 0–44)
AST: 37 U/L (ref 15–41)
Albumin: 3.1 g/dL — ABNORMAL LOW (ref 3.5–5.0)
Alkaline Phosphatase: 87 U/L (ref 38–126)
Anion gap: 10 (ref 5–15)
BUN: 17 mg/dL (ref 8–23)
CO2: 21 mmol/L — ABNORMAL LOW (ref 22–32)
Calcium: 9.8 mg/dL (ref 8.9–10.3)
Chloride: 109 mmol/L (ref 98–111)
Creatinine: 1.15 mg/dL (ref 0.61–1.24)
GFR, Est AFR Am: >60 mL/min (ref >60)
GFR, Estimated: >60 mL/min (ref >60)
Glucose, Bld: 182 mg/dL — ABNORMAL HIGH (ref 70–99)
Potassium: 4.2 mmol/L (ref 3.5–5.1)
Sodium: 140 mmol/L (ref 135–145)
Total Bilirubin: 0.4 mg/dL (ref 0.3–1.2)
Total Protein: 7.2 g/dL (ref 6.5–8.1)

## 2020-08-31 LAB — CBC WITH DIFFERENTIAL (CANCER CENTER ONLY)
Abs Immature Granulocytes: 0.01 10*3/uL (ref 0.00–0.07)
Basophils Absolute: 0 10*3/uL (ref 0.0–0.1)
Basophils Relative: 1 %
Eosinophils Absolute: 0.1 10*3/uL (ref 0.0–0.5)
Eosinophils Relative: 3 %
HCT: 34.7 % — ABNORMAL LOW (ref 39.0–52.0)
Hemoglobin: 10.9 g/dL — ABNORMAL LOW (ref 13.0–17.0)
Immature Granulocytes: 0 %
Lymphocytes Relative: 11 %
Lymphs Abs: 0.5 10*3/uL — ABNORMAL LOW (ref 0.7–4.0)
MCH: 28.6 pg (ref 26.0–34.0)
MCHC: 31.4 g/dL (ref 30.0–36.0)
MCV: 91.1 fL (ref 80.0–100.0)
Monocytes Absolute: 0.3 10*3/uL (ref 0.1–1.0)
Monocytes Relative: 8 %
Neutro Abs: 3.4 10*3/uL (ref 1.7–7.7)
Neutrophils Relative %: 77 %
Platelet Count: 136 10*3/uL — ABNORMAL LOW (ref 150–400)
RBC: 3.81 MIL/uL — ABNORMAL LOW (ref 4.22–5.81)
RDW: 19.2 % — ABNORMAL HIGH (ref 11.5–15.5)
WBC Count: 4.4 10*3/uL (ref 4.0–10.5)
nRBC: 0 % (ref 0.0–0.2)

## 2020-08-31 LAB — TSH: TSH: 1.981 u[IU]/mL (ref 0.320–4.118)

## 2020-08-31 MED ORDER — SODIUM CHLORIDE 0.9 % IV SOLN
Freq: Once | INTRAVENOUS | Status: AC
  Filled 2020-08-31: qty 250

## 2020-08-31 MED ORDER — SODIUM CHLORIDE 0.9 % IV SOLN
200.0000 mg | Freq: Once | INTRAVENOUS | Status: AC
  Administered 2020-08-31: 200 mg via INTRAVENOUS
  Filled 2020-08-31: qty 8

## 2020-08-31 NOTE — Progress Notes (Signed)
Hematology and Oncology Follow Up Visit  Jimmy [name] [id number] 1946/07/06 74 y.o. 08/31/2020 11:28 AM Jimmy [name], MDLalonde, Jimmy Jarvis, MD   Principle Diagnosis: 74 year old man with bladder cancer diagnosed in June 2020.  He presented with T3aN0 high-grade urothelial carcinoma and developed stage IV disease and pulmonary involvement in July 2021.   Prior Therapy: TURBT with right ureteral stent placement as well as left ureteral stent placement at that time.  The final pathology showed infiltrating high-grade papillary adenocarcinoma invading into the detrusor muscle with lymphovascular invasion identified.  This was completed on 06/22/2019.    Neoadjuvant chemotherapy started on August 07, 2019 with cisplatin and gemcitabine started on August 07, 2019.  He completed 4 cycles of chemotherapy on October 16, 2019.  He is status post radical cystectomy and lymph node dissection on December 10, 2019.  The final pathology showed 1.5 cm residual tumor with invasion into the perivesicular soft tissue.  Final pathological stage was T3a and 0 with 0 out of 10 lymph nodes involved.  Current therapy: Pembrolizumab 200 mg every 3 weeks started on August 10, 2020.  He is here for cycle 2 of therapy.  Interim History: Jimmy [name] returns today for a follow-up visit.  Since the last visit, he reports no major changes in his health.  He denies any recent complications related to Pembrolizumab.  He does report some mild fatigue tiredness but no nausea, vomiting or abdominal pain.  He denies any shortness of breath or difficulty breathing.  He denies any bleeding complications.  He denies any fevers, chills or sweats.                Medications: Unchanged on review. Current Outpatient Medications  Medication Sig Dispense Refill  . Accu-Chek Softclix Lancets lancets 1 each by Other route in the morning, at noon, and at bedtime. Use as instructed 300 each 2  . acetaminophen (TYLENOL) 325 MG  tablet Take 2 tablets (650 mg total) by mouth every 6 (six) hours as needed for mild pain or headache (fever >/= 101). 30 tablet 0  . apixaban (ELIQUIS) 5 MG TABS tablet Take 1 tablet (5 mg total) by mouth 2 (two) times daily. 180 tablet 1  . Colchicine 0.6 MG CAPS Take 0.6 mg by mouth 2 (two) times daily. (Patient taking differently: Take 0.6 mg by mouth 2 (two) times daily. ) 60 capsule 0  . COMBIVENT RESPIMAT 20-100 MCG/ACT AERS respimat USE 1 PUFF 4 TIMES A DAY RINSE MOUTH AFTER USE (Patient taking differently: Inhale 1 puff into the lungs in the morning, at noon, in the evening, and at bedtime. Rinse mouth after use) 4 g 5  . ferrous sulfate 325 (65 FE) MG tablet Take 325 mg by mouth daily with breakfast.    . glucose blood test strip 1 each by Other route in the morning, at noon, and at bedtime. Use as instructed 300 each 2  . insulin detemir (LEVEMIR) 100 UNIT/ML injection Inject 16 Units into the skin daily.    . Insulin Pen Needle 32G X 4 MM MISC Use for injections twice daily 200 each 1  . LEVEMIR FLEXTOUCH 100 UNIT/ML FlexPen INJECT 16 UNITS TWICE A DAY 15 mL 1  . metFORMIN (GLUCOPHAGE) 1000 MG tablet Take 1,000 mg by mouth daily.     . metoprolol tartrate (LOPRESSOR) 25 MG tablet TAKE 1/2 TABLET TWICE A DAY (Patient taking differently: Take 12.5 mg by mouth 2 (two) times daily. ) 90 tablet 1  .  Multiple Vitamin (MULTIVITAMIN WITH MINERALS) TABS tablet Take 1 tablet by mouth daily. 30 tablet 0  . pantoprazole (PROTONIX) 40 MG tablet Take 40 mg by mouth daily.    . polyethylene glycol (MIRALAX / GLYCOLAX) 17 g packet Take 17 g by mouth daily as needed for moderate constipation or severe constipation. 14 each 0   No current facility-administered medications for this visit.     Allergies:  Allergies  Allergen Reactions  . Ace Inhibitors Cough  . Cephalexin Hives, Diarrhea and Nausea Only    Tolerates Ancef, Rocephin, Cefoxitin  . Crestor [Rosuvastatin Calcium]   . Lipitor  [Atorvastatin Calcium] Other (See Comments)    MUSCLE PAINS   . Plavix [Clopidogrel Bisulfate] Hives  . Vancomycin Nausea And Vomiting    Po route caused n/v.        Physical Exam:   Blood pressure (!) 124/58, pulse 77, temperature 98.6 F (37 C), temperature source Tympanic, resp. rate 18, height 5\' 7"  (1.702 m), weight 226 lb 9.6 oz (102.8 kg), SpO2 98 %.      ECOG: 2    General appearance: Comfortable appearing without any discomfort Head: Normocephalic without any trauma Oropharynx: Mucous membranes are moist and pink without any thrush or ulcers. Eyes: Pupils are equal and round reactive to light. Lymph nodes: No cervical, supraclavicular, inguinal or axillary lymphadenopathy.   Heart:regular rate and rhythm.  S1 and S2.  Mild edema his lower extremity left more than right. [name]: Clear without any rhonchi or wheezes.  No dullness to percussion. Abdomin: Soft, nontender, nondistended with good bowel sounds.  No hepatosplenomegaly. Musculoskeletal: No joint deformity or effusion.  Full range of motion noted. Neurological: No deficits noted on motor, sensory and deep tendon reflex exam. Skin: No petechial rash or dryness.  Appeared moist.            Lab Results: Lab Results  Component Value Date   WBC 4.4 08/31/2020   HGB 10.9 (L) 08/31/2020   HCT 34.7 (L) 08/31/2020   MCV 91.1 08/31/2020   PLT 136 (L) 08/31/2020     Chemistry      Component Value Date/Time   NA 140 08/11/2020 1451   K 5.2 08/11/2020 1451   CL 109 (H) 08/11/2020 1451   CO2 17 (L) 08/11/2020 1451   BUN 20 08/11/2020 1451   CREATININE 1.18 08/11/2020 1451   CREATININE 1.73 (H) 07/22/2020 1058   CREATININE 0.89 11/19/2016 0920      Component Value Date/Time   CALCIUM 9.2 08/11/2020 1451   ALKPHOS 136 (H) 08/09/2020 1134   AST 36 08/09/2020 1134   AST 28 07/22/2020 1058   ALT 26 08/09/2020 1134   ALT 17 07/22/2020 1058   BILITOT <0.2 08/09/2020 1134   BILITOT 0.5 07/22/2020  1058       Impression and Plan:   74 year old man with:  1.  Stage IV bladder cancer with pulmonary involvement diagnosed in July 2021.  He is currently on Pembrolizumab without any major complications.  Natural course of his disease and alternative treatment options were discussed.  Long-term complication associated with this treatments including pruritus, skin rash and immune mediated complications.  Alternative options such as Padcev would be reserved if he developed progression of disease.  He is agreeable to proceed at this time.  He will receive the next cycle on September 22 and cycle 4 will be on October 13.  We will arrange staging work-up after cycle 4 to update his treatment response.  2. IV access:Port-A-Cath has been removed and currently using peripheral veins.  He has susceptibility to develop bacteremia which complicates having a device put in.  After discussion today, he declined any device placement and will continue with peripheral veins.  3.  Recurrent urosepsis: Resolved at this time without any issues.  4.  Immune mediated complications.  I continue to educate him about complications including pneumonitis, colitis and thyroid disease.  He has not experienced any at this time.  5. Follow-up:  In 3 weeks for the next cycle of therapy.  30  minutes were dedicated to this visit.  The time was spent on reviewing his disease status, discussing treatment options and future plan of care review.      Zola Button, MD 9/1/202111:28 AM

## 2020-08-31 NOTE — Patient Instructions (Signed)
[facility] Discharge Instructions for Patients Receiving Chemotherapy  Today you received the following chemotherapy agents Keytruda  To help prevent nausea and vomiting after your treatment, we encourage you to take your nausea medication as directed   If you develop nausea and vomiting that is not controlled by your nausea medication, call the clinic.   BELOW ARE SYMPTOMS THAT SHOULD BE REPORTED IMMEDIATELY:  *FEVER GREATER THAN 100.5 F  *CHILLS WITH OR WITHOUT FEVER  NAUSEA AND VOMITING THAT IS NOT CONTROLLED WITH YOUR NAUSEA MEDICATION  *UNUSUAL SHORTNESS OF BREATH  *UNUSUAL BRUISING OR BLEEDING  TENDERNESS IN MOUTH AND THROAT WITH OR WITHOUT PRESENCE OF ULCERS  *URINARY PROBLEMS  *BOWEL PROBLEMS  UNUSUAL RASH Items with * indicate a potential emergency and should be followed up as soon as possible.  Feel free to call the clinic should you have any questions or concerns. The clinic phone number is [phone].  Please show the CHEMO ALERT CARD at check-in to the Emergency Department and triage nurse.  Pembrolizumab injection What is this medicine? PEMBROLIZUMAB (pem broe liz ue mab) is a monoclonal antibody. It is used to treat certain types of cancer. This medicine may be used for other purposes; ask your health care provider or pharmacist if you have questions. COMMON BRAND NAME(S): Keytruda What should I tell my health care provider before I take this medicine? They need to know if you have any of these conditions:  diabetes  immune system problems  inflammatory bowel disease  liver disease  lung or breathing disease  lupus  received or scheduled to receive an organ transplant or a stem-cell transplant that uses donor stem cells  an unusual or allergic reaction to pembrolizumab, other medicines, foods, dyes, or preservatives  pregnant or trying to get pregnant  breast-feeding How should I use this medicine? This medicine is for  infusion into a vein. It is given by a health care professional in a hospital or clinic setting. A special MedGuide will be given to you before each treatment. Be sure to read this information carefully each time. Talk to your pediatrician regarding the use of this medicine in children. While this drug may be prescribed for children as young as 6 months for selected conditions, precautions do apply. Overdosage: If you think you have taken too much of this medicine contact a poison control center or emergency room at once. NOTE: This medicine is only for you. Do not share this medicine with others. What if I miss a dose? It is important not to miss your dose. Call your doctor or health care professional if you are unable to keep an appointment. What may interact with this medicine? Interactions have not been studied. Give your health care provider a list of all the medicines, herbs, non-prescription drugs, or dietary supplements you use. Also tell them if you smoke, drink alcohol, or use illegal drugs. Some items may interact with your medicine. This list may not describe all possible interactions. Give your health care provider a list of all the medicines, herbs, non-prescription drugs, or dietary supplements you use. Also tell them if you smoke, drink alcohol, or use illegal drugs. Some items may interact with your medicine. What should I watch for while using this medicine? Your condition will be monitored carefully while you are receiving this medicine. You may need blood work done while you are taking this medicine. Do not become pregnant while taking this medicine or for 4 months after stopping it. Women should   inform their doctor if they wish to become pregnant or think they might be pregnant. There is a potential for serious side effects to an unborn child. Talk to your health care professional or pharmacist for more information. Do not breast-feed an infant while taking this medicine or for 4  months after the last dose. What side effects may I notice from receiving this medicine? Side effects that you should report to your doctor or health care professional as soon as possible:  allergic reactions like skin rash, itching or hives, swelling of the face, lips, or tongue  bloody or black, tarry  breathing problems  changes in vision  chest pain  chills  confusion  constipation  cough  diarrhea  dizziness or feeling faint or lightheaded  fast or irregular heartbeat  fever  flushing  joint pain  low blood counts - this medicine may decrease the number of white blood cells, red blood cells and platelets. You may be at increased risk for infections and bleeding.  muscle pain  muscle weakness  pain, tingling, numbness in the hands or feet  persistent headache  redness, blistering, peeling or loosening of the skin, including inside the mouth  signs and symptoms of high blood sugar such as dizziness; dry mouth; dry skin; fruity breath; nausea; stomach pain; increased hunger or thirst; increased urination  signs and symptoms of kidney injury like trouble passing urine or change in the amount of urine  signs and symptoms of liver injury like dark urine, light-colored stools, loss of appetite, nausea, right upper belly pain, yellowing of the eyes or skin  sweating  swollen lymph nodes  weight loss Side effects that usually do not require medical attention (report to your doctor or health care professional if they continue or are bothersome):  decreased appetite  hair loss  muscle pain  tiredness This list may not describe all possible side effects. Call your doctor for medical advice about side effects. You may report side effects to FDA at 1-800-FDA-1088. Where should I keep my medicine? This drug is given in a hospital or clinic and will not be stored at home. NOTE: This sheet is a summary. It may not cover all possible information. If you have  questions about this medicine, talk to your doctor, pharmacist, or health care provider.  2020 Elsevier/Gold Standard (2019-10-23 18:07:58)  

## 2020-09-01 ENCOUNTER — Other Ambulatory Visit: Payer: Medicare PPO

## 2020-09-01 DIAGNOSIS — T387X5S Adverse effect of androgens and anabolic congeners, sequela: Secondary | ICD-10-CM | POA: Diagnosis not present

## 2020-09-01 DIAGNOSIS — E1122 Type 2 diabetes mellitus with diabetic chronic kidney disease: Secondary | ICD-10-CM | POA: Diagnosis not present

## 2020-09-01 DIAGNOSIS — J9601 Acute respiratory failure with hypoxia: Secondary | ICD-10-CM | POA: Diagnosis not present

## 2020-09-01 DIAGNOSIS — N1831 Chronic kidney disease, stage 3a: Secondary | ICD-10-CM | POA: Diagnosis not present

## 2020-09-01 DIAGNOSIS — I129 Hypertensive chronic kidney disease with stage 1 through stage 4 chronic kidney disease, or unspecified chronic kidney disease: Secondary | ICD-10-CM | POA: Diagnosis not present

## 2020-09-01 DIAGNOSIS — M818 Other osteoporosis without current pathological fracture: Secondary | ICD-10-CM | POA: Diagnosis not present

## 2020-09-01 DIAGNOSIS — A419 Sepsis, unspecified organism: Secondary | ICD-10-CM | POA: Diagnosis not present

## 2020-09-01 DIAGNOSIS — M109 Gout, unspecified: Secondary | ICD-10-CM | POA: Diagnosis not present

## 2020-09-01 DIAGNOSIS — J439 Emphysema, unspecified: Secondary | ICD-10-CM | POA: Diagnosis not present

## 2020-09-07 ENCOUNTER — Ambulatory Visit
Admission: RE | Admit: 2020-09-07 | Discharge: 2020-09-07 | Disposition: A | Discharge status: Home / Self Care | Payer: Medicare PPO | Source: Ambulatory Visit | Attending: Orthopedic Surgery | Admitting: Orthopedic Surgery

## 2020-09-07 ENCOUNTER — Other Ambulatory Visit: Payer: Self-pay

## 2020-09-07 DIAGNOSIS — M25831 Other specified joint disorders, right wrist: Secondary | ICD-10-CM

## 2020-09-07 DIAGNOSIS — M24831 Other specific joint derangements of right wrist, not elsewhere classified: Secondary | ICD-10-CM | POA: Diagnosis not present

## 2020-09-07 DIAGNOSIS — S63591A Other specified sprain of right wrist, initial encounter: Secondary | ICD-10-CM | POA: Diagnosis not present

## 2020-09-07 MED ORDER — IOPAMIDOL (ISOVUE-M 200) INJECTION 41%: 2.0000 mL | Freq: Once | INTRAMUSCULAR | Status: DC

## 2020-09-07 MED ORDER — GADOBENATE DIMEGLUMINE 529 MG/ML IV SOLN
10.0000 mL | Freq: Once | INTRAVENOUS | Status: AC | PRN
  Administered 2020-09-07: 10 mL via INTRAVENOUS

## 2020-09-10 ENCOUNTER — Other Ambulatory Visit: Payer: Self-pay | Admitting: Family Medicine

## 2020-09-12 DIAGNOSIS — M25831 Other specified joint disorders, right wrist: Secondary | ICD-10-CM | POA: Diagnosis not present

## 2020-09-14 ENCOUNTER — Telehealth: Payer: Self-pay | Admitting: Oncology

## 2020-09-14 DIAGNOSIS — J449 Chronic obstructive pulmonary disease, unspecified: Secondary | ICD-10-CM | POA: Diagnosis not present

## 2020-09-14 DIAGNOSIS — U071 COVID-19: Secondary | ICD-10-CM | POA: Diagnosis not present

## 2020-09-14 NOTE — Telephone Encounter (Signed)
Scheduled per 09/01 los, patient has been called and notified. 

## 2020-09-19 NOTE — Progress Notes (Signed)
[place] OFFICE PROGRESS NOTE  [name], MD [place] [address]  DIAGNOSIS: 74 year old man with bladder cancer diagnosed in June 2020.  He presented with T3aN0 high-grade urothelial carcinoma and developed stage IV disease and pulmonary involvement in July 2021.  PRIOR THERAPY: TURBT with right ureteral stent placement as well as left ureteral stent placement at that time. Thefinal pathology showed infiltrating high-grade papillary adenocarcinoma invading into the detrusor muscle with lymphovascular invasion identified. This was completed on 06/22/2019.   Neoadjuvant chemotherapy started on August 07, 2019 with cisplatin and gemcitabine started on August 07, 2019.  He completed 4 cycles of chemotherapy on October 16, 2019.  He is status post radical cystectomy and lymph node dissection on December 10, 2019.  The final pathology showed 1.5 cm residual tumor with invasion into the perivesicular soft tissue.  Final pathological stage was T3a and 0 with 0 out of 10 lymph nodes involved.   CURRENT THERAPY: Pembrolizumab 200 mg every 3 weeks started on August 10, 2020.  He is here for cycle 3 of therapy.  INTERVAL HISTORY: Jimmy [name] 74 y.o. male returns to the clinic for a follow up visit accompanied by his wife. The patient is feeling well today without any concerning complaints except for stable but persistent mild itching since starting [place]. He itches primarily on his back. He uses hydrocortisone cream but is inquiring about any other options for the times where his itching is severe. His itching is usually the worst at night. Denies rash. Otherwise, the patient continues to tolerate treatment with immunotherapy with [facility] well without any adverse effects besides the itching. Denies any fever, chills, night sweats, or weight loss. Denies any chest pain, shortness of breath, cough, or hemoptysis. Denies any nausea, vomiting, diarrhea,  or constipation. Denies abdominal pain. Denies changes with his bladder habits. The patient is here today for evaluation prior to starting cycle # 3  MEDICAL HISTORY: Past Medical History:  Diagnosis Date  . Astigmatism of both eyes 11/01/2016  . Bronchitis   . Coronary artery disease   . Cortical age-related cataract of both eyes 11/01/2016  . Diabetes mellitus without complication ([place])    type 2  . Drug-induced osteoporosis   . Dyslipidemia   . Fatigue   . Fatty liver   . History of kidney stones   . Hypercholesteremia   . Hypogonadism male   . 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 Normal nuclear stress test March 2011  . Nuclear sclerotic cataract of both eyes 11/01/2016  . Obesity   . Prostate cancer [facility])    s/p seed implant and radiation  . Sleep apnea    90% of time dosent wear his cpap per pt  . Vitamin D insufficiency     ALLERGIES:  is allergic to ace inhibitors, cephalexin, crestor [rosuvastatin calcium], lipitor [atorvastatin calcium], plavix [clopidogrel bisulfate], and vancomycin.  MEDICATIONS:  Current Outpatient Medications  Medication Sig Dispense Refill  . Accu-Chek Softclix Lancets lancets 1 each by Other route in the morning, at noon, and at bedtime. Use as instructed 300 each 2  . acidophilus (RISAQUAD) CAPS capsule Take 1 capsule by mouth daily.    . celecoxib (CELEBREX) 200 MG capsule Take 200 mg by mouth daily.    . COMBIVENT RESPIMAT 20-100 MCG/ACT AERS respimat USE 1 PUFF 4 TIMES A DAY RINSE MOUTH  AFTER USE (Patient taking differently: Inhale 1 puff into the lungs in the morning, at noon, in the evening, and at bedtime. Rinse mouth after use) 4 g 5  . diclofenac Sodium (VOLTAREN) 1 % GEL Apply topically as needed.    [name] ELIQUIS 5 MG TABS tablet TAKE 1 TABLET BY MOUTH TWICE A DAY 180 tablet 1  . ferrous sulfate 325 (65 FE) MG tablet Take 325 mg by mouth daily with  breakfast.    . glucose blood test strip 1 each by Other route in the morning, at noon, and at bedtime. Use as instructed 300 each 2  . insulin detemir (LEVEMIR) 100 UNIT/ML injection Inject 16 Units into the skin daily.    . Insulin Pen Needle 32G X 4 MM MISC Use for injections twice daily 200 each 1  . metFORMIN (GLUCOPHAGE) 1000 MG tablet Take 1,000 mg by mouth daily.     . metoprolol tartrate (LOPRESSOR) 25 MG tablet TAKE 1/2 TABLET BY MOUTH TWICE A DAY 90 tablet 1  . Multiple Vitamin (MULTIVITAMIN WITH MINERALS) TABS tablet Take 1 tablet by mouth daily. 30 tablet 0  . pantoprazole (PROTONIX) 40 MG tablet Take 40 mg by mouth daily.    . hydrOXYzine (ATARAX/VISTARIL) 10 MG tablet Take 1 tablet (10 mg total) by mouth 3 (three) times daily as needed. 30 tablet 2   No current facility-administered medications for this visit.   Facility-Administered Medications Ordered in Other Visits  Medication Dose Route Frequency Provider Last Rate Last Admin  . pembrolizumab (KEYTRUDA) 200 mg in sodium chloride 0.9 % 50 mL chemo infusion  200 mg Intravenous Once Wyatt Portela, MD 116 mL/hr at 09/21/20 1210 200 mg at 09/21/20 1210    SURGICAL HISTORY:  Past Surgical History:  Procedure Laterality Date  . BUBBLE STUDY  12/30/2019   Procedure: BUBBLE STUDY;  Surgeon: Pixie Casino, MD;  Location: [place];  Service: Cardiovascular;;  . CARDIAC CATHETERIZATION  April 2000  . IR IMAGING GUIDED PORT INSERTION  07/30/2019  . IR REMOVAL TUN ACCESS W/ PORT W/O FL MOD SED  12/26/2019  . RADIOACTIVE SEED IMPLANT  2009   Implanted radiation seeds  . TEE WITHOUT CARDIOVERSION N/A 12/30/2019   Procedure: TRANSESOPHAGEAL ECHOCARDIOGRAM (TEE);  Surgeon: Pixie Casino, MD;  Location: [facility] END[facility]COPY;  Service: Cardiovascular;  Laterality: N/A;  . thumb surgery     as a teen caught thumb in machine  . TRANSURETHRAL RESECTION OF BLADDER TUMOR N/A 06/22/2019   Procedure: TRANSURETHRAL RESECTION OF BLADDER  TUMOR (TURBT), CYSTOSCOPY,  BILATERAL RETROGRADE PYELOGRAPHY, BILATERAL STENT PLACEMENT;  Surgeon: Raynelle Bring, MD;  Location: [facility];  Service: Urology;  Laterality: N/A;  GENERAL WITH PARALYSIS    REVIEW OF SYSTEMS:   Review of Systems  Constitutional: Negative for appetite change, chills, fatigue, fever and unexpected weight change.  HENT: Negative for mouth sores, nosebleeds, sore throat and trouble swallowing.   Eyes: Negative for eye problems and icterus.  Respiratory: Negative for cough, hemoptysis, shortness of breath and wheezing.  On oxygen via nasal cannula.  Cardiovascular: Negative for chest pain and leg swelling.  Gastrointestinal: Negative for abdominal pain, constipation, diarrhea, nausea and vomiting.  Genitourinary: Negative for bladder incontinence, difficulty urinating, dysuria, frequency and hematuria.   Musculoskeletal: Negative for back pain, gait problem, neck pain and neck stiffness.  Skin: Positive for itching without rash.  Neurological: Negative for dizziness, extremity weakness, gait problem, headaches, light-headedness and seizures.  Hematological: Negative for adenopathy. Does not bruise/bleed easily.  Psychiatric/Behavioral: Negative for confusion, depression and sleep disturbance. The patient is not nervous/anxious.     PHYSICAL EXAMINATION:  Blood pressure 130/68, pulse 71, temperature 98.3 F (36.8 C), temperature source Tympanic, resp. rate 18, height 5\' 7"  (1.702 m), weight 234 lb 11.2 oz (106.5 kg), SpO2 99 %.  ECOG PERFORMANCE STATUS: 1 - Symptomatic but completely ambulatory  Physical Exam  Constitutional: Oriented to person, place, and time and well-developed, well-nourished, and in no distress.  HENT:  Head: Normocephalic and atraumatic.  Mouth/Throat: Oropharynx is clear and moist. No oropharyngeal exudate.  Eyes: Conjunctivae are normal. Right eye exhibits no discharge. Left eye exhibits no discharge. No scleral icterus.  Neck: Normal  range of motion. Neck supple.  Cardiovascular: Normal rate, regular rhythm, normal heart sounds and intact distal pulses.   Pulmonary/Chest: Effort normal and breath sounds normal. No respiratory distress. No wheezes. No rales.  Abdominal: Soft. Bowel sounds are normal. Exhibits no distension and no mass. There is no tenderness.  Musculoskeletal: Normal range of motion. Exhibits no edema.  Lymphadenopathy:    No cervical adenopathy.  Neurological: Alert and oriented to person, place, and time. Exhibits normal muscle tone. Examined in the wheelchair.  Skin: Skin is warm and dry. No rash noted. Not diaphoretic. No erythema. No pallor.  Psychiatric: Mood, memory and judgment normal.  Vitals reviewed.  LABORATORY DATA: Lab Results  Component Value Date   WBC 4.1 09/21/2020   HGB 11.1 (L) 09/21/2020   HCT 35.8 (L) 09/21/2020   MCV 94.2 09/21/2020   PLT 129 (L) 09/21/2020      Chemistry      Component Value Date/Time   NA 140 09/21/2020 0944   NA 140 08/11/2020 1451   K 4.3 09/21/2020 0944   CL 108 09/21/2020 0944   CO2 25 09/21/2020 0944   BUN 25 (H) 09/21/2020 0944   BUN 20 08/11/2020 1451   CREATININE 1.35 (H) 09/21/2020 0944   CREATININE 0.89 11/19/2016 0920      Component Value Date/Time   CALCIUM 9.6 09/21/2020 0944   ALKPHOS 83 09/21/2020 0944   AST 48 (H) 09/21/2020 0944   ALT 27 09/21/2020 0944   BILITOT 0.5 09/21/2020 0944       RADIOGRAPHIC STUDIES:  MR WRIST RIGHT W CONTRAST  Result Date: 09/08/2020 CLINICAL DATA:  Right wrist pain EXAM: MR ARTHROGRAM OF THE RIGHT WRIST TECHNIQUE: Multiplanar, multisequence MR imaging of the right wrist was performed following the administration of intra-articular contrast. COMPARISON:  X-ray 08/01/2020 CONTRAST:  See injection documentation FINDINGS: Ligaments: The dorsal and volar bands of the scapholunate ligament are thickened and heterogeneous with partial thickness tearing (series 15, image 13). Contrast does extend into  the middle carpal row. No widening of the scapholunate interval. Lunotriquetral ligaments appear degenerated although grossly intact. Triangular fibrocartilage: Large full-thickness tear of the articular disc of the TFC with contrast filling the distal radioulnar joint (series 19, image 5). Tendons: Tendinosis and longitudinal split tear of the extensor carpi ulnaris tendon. The remaining extensor tendons of the wrist are within normal limits. Intact flexor tendons. Carpal tunnel/median nerve: Normal carpal tunnel. Normal median nerve. Guyon's canal: Unremarkable. Joint/cartilage: Mild-to-moderate arthropathy of the wrist most notably involving the triscaphe joint. No focal erosion. Bones/carpal alignment: No acute fracture.  No malalignment. Other: No abnormal solid or cystic lesions identified. IMPRESSION: 1. Full-thickness tear of the articular disc of the TFC. 2. Tendinosis and longitudinal split tear of the extensor carpi ulnaris tendon. 3. Partial thickness degenerative tearing of the  scapholunate ligament. No widening of the scapholunate interval. 4. Mild-to-moderate arthropathy of the wrist most notably involving the triscaphe joint. Electronically Signed   By: Davina Poke D.O.   On: 09/08/2020 08:32   DG FLUORO GUIDED NEEDLE PLC ASPIRATION/INJECTION LOC  Result Date: 09/07/2020 CLINICAL DATA:  Right wrist abutment syndrome. FLUOROSCOPY TIME:  Fluoroscopy Time: 13 seconds Radiation Exposure Index: 0.65 microGray*m^2 PROCEDURE: RIGHT WRIST INJECTION UNDER FLUOROSCOPY An appropriate skin entrance site was determined. The site was marked, prepped with Betadine, draped in the usual sterile fashion, and infiltrated locally with 1% Lidocaine. A 25 gauge skin needle was advanced into the radiocarpal joint under intermittent fluoroscopy. A mixture of 0.1 mL of MultiHance, 15 mL of Isovue-M 200, and 5 mL of sterile saline was then used to opacify the proximal carpal joint with contrast also filling the  midcarpal joint. 2 mL of this mixture were injected. No immediate complication. IMPRESSION: Technically successful right wrist injection for MRI. Electronically Signed   By: Logan Bores M.D.   On: 09/07/2020 16:05     ASSESSMENT/PLAN:  74 year old man with:  1.  Stage IV bladder cancer with pulmonary involvement diagnosed in July 2021.  He is currently on Pembrolizumab without any major complications.  Long-term complication associated with this treatments including pruritus, skin rash and immune mediated complications. Labs were reviewed which are adequate to proceed with cycle #3 today as scheduled. He is agreeable to proceed at this time.  He will receive cycle 4 on October 13.  Per Dr. Hazeline Junker last note, he will arrange staging work-up after cycle 4 to assess his treatment response.  2. IV access:Port-A-Cath has been removed and currently using peripheral veins.  He has susceptibility to develop bacteremia which complicates having a device put in.  After discussion today, he declined any device placement and will continue with peripheral veins.  3.  Recurrent urosepsis: Resolved at this time without any issues.  4.  Immune mediated complications.  Dr. Alen Blew previously educated him about complications including pneumonitis, colitis and thyroid disease.    The patient's main concern is related to mild itching. However, sometimes his itching can be bothersome. He primarily uses hydrocortisone cream but he is asking about anything else to help him symptomatically. I will give them a prescription for atarax 10 mg p.o. TID PRN. I discussed this may make him drowsy so only take it if really needed for significant itching. Advised him not to take this before driving. He currently does not drive   5. COVID Booster  The patient received pfizer vaccine a few months ago and they are interested in receiving the booster. I discussed I could arrange for the COVID 19 booster in our clinic but  cannot arrange for it today due to the short notice. They live far away and it is challenging to get here. I discussed that it may be easier to get the vaccine locally given his transportation constraints.   6. Follow-up: In 3 weeks for the next cycle of therapy. No orders of the defined types were placed in this encounter.    Regla Fitzgibbon L Thierno Hun, PA-C 09/21/20

## 2020-09-21 ENCOUNTER — Other Ambulatory Visit: Payer: Self-pay

## 2020-09-21 ENCOUNTER — Other Ambulatory Visit: Payer: Self-pay | Admitting: Family Medicine

## 2020-09-21 ENCOUNTER — Inpatient Hospital Stay: Payer: Medicare PPO | Admitting: Physician Assistant

## 2020-09-21 ENCOUNTER — Inpatient Hospital Stay: Payer: Medicare PPO

## 2020-09-21 VITALS — BP 130/68 | HR 71 | Temp 98.3°F | Resp 18 | Ht 67.0 in | Wt 234.7 lb

## 2020-09-21 DIAGNOSIS — Z9221 Personal history of antineoplastic chemotherapy: Secondary | ICD-10-CM | POA: Diagnosis not present

## 2020-09-21 DIAGNOSIS — Z79899 Other long term (current) drug therapy: Secondary | ICD-10-CM | POA: Diagnosis not present

## 2020-09-21 DIAGNOSIS — L299 Pruritus, unspecified: Secondary | ICD-10-CM | POA: Diagnosis not present

## 2020-09-21 DIAGNOSIS — Z5112 Encounter for antineoplastic immunotherapy: Secondary | ICD-10-CM | POA: Diagnosis not present

## 2020-09-21 DIAGNOSIS — C670 Malignant neoplasm of trigone of bladder: Secondary | ICD-10-CM | POA: Diagnosis not present

## 2020-09-21 DIAGNOSIS — C679 Malignant neoplasm of bladder, unspecified: Secondary | ICD-10-CM

## 2020-09-21 LAB — CBC WITH DIFFERENTIAL (CANCER CENTER ONLY)
Abs Immature Granulocytes: 0.02 10*3/uL (ref 0.00–0.07)
Basophils Absolute: 0 10*3/uL (ref 0.0–0.1)
Basophils Relative: 1 %
Eosinophils Absolute: 0.2 10*3/uL (ref 0.0–0.5)
Eosinophils Relative: 4 %
HCT: 35.8 % — ABNORMAL LOW (ref 39.0–52.0)
Hemoglobin: 11.1 g/dL — ABNORMAL LOW (ref 13.0–17.0)
Immature Granulocytes: 1 %
Lymphocytes Relative: 12 %
Lymphs Abs: 0.5 10*3/uL — ABNORMAL LOW (ref 0.7–4.0)
MCH: 29.2 pg (ref 26.0–34.0)
MCHC: 31 g/dL (ref 30.0–36.0)
MCV: 94.2 fL (ref 80.0–100.0)
Monocytes Absolute: 0.4 10*3/uL (ref 0.1–1.0)
Monocytes Relative: 9 %
Neutro Abs: 3 10*3/uL (ref 1.7–7.7)
Neutrophils Relative %: 73 %
Platelet Count: 129 10*3/uL — ABNORMAL LOW (ref 150–400)
RBC: 3.8 MIL/uL — ABNORMAL LOW (ref 4.22–5.81)
RDW: 18.4 % — ABNORMAL HIGH (ref 11.5–15.5)
WBC Count: 4.1 10*3/uL (ref 4.0–10.5)
nRBC: 0 % (ref 0.0–0.2)

## 2020-09-21 LAB — CMP (CANCER CENTER ONLY)
ALT: 27 U/L (ref 0–44)
AST: 48 U/L — ABNORMAL HIGH (ref 15–41)
Albumin: 3.3 g/dL — ABNORMAL LOW (ref 3.5–5.0)
Alkaline Phosphatase: 83 U/L (ref 38–126)
Anion gap: 7 (ref 5–15)
BUN: 25 mg/dL — ABNORMAL HIGH (ref 8–23)
CO2: 25 mmol/L (ref 22–32)
Calcium: 9.6 mg/dL (ref 8.9–10.3)
Chloride: 108 mmol/L (ref 98–111)
Creatinine: 1.35 mg/dL — ABNORMAL HIGH (ref 0.61–1.24)
GFR, Est AFR Am: 60 mL/min — ABNORMAL LOW (ref >60)
GFR, Estimated: 51 mL/min — ABNORMAL LOW (ref >60)
Glucose, Bld: 139 mg/dL — ABNORMAL HIGH (ref 70–99)
Potassium: 4.3 mmol/L (ref 3.5–5.1)
Sodium: 140 mmol/L (ref 135–145)
Total Bilirubin: 0.5 mg/dL (ref 0.3–1.2)
Total Protein: 7.6 g/dL (ref 6.5–8.1)

## 2020-09-21 LAB — TSH: TSH: 2.401 u[IU]/mL (ref 0.320–4.118)

## 2020-09-21 MED ORDER — SODIUM CHLORIDE 0.9 % IV SOLN
200.0000 mg | Freq: Once | INTRAVENOUS | Status: AC
  Administered 2020-09-21: 200 mg via INTRAVENOUS
  Filled 2020-09-21: qty 8

## 2020-09-21 MED ORDER — SODIUM CHLORIDE 0.9 % IV SOLN
Freq: Once | INTRAVENOUS | Status: AC
  Filled 2020-09-21: qty 250

## 2020-09-21 MED ORDER — HYDROXYZINE HCL 10 MG PO TABS: 10.0000 mg | ORAL_TABLET | Freq: Three times a day (TID) | ORAL | 2 refills | Status: DC | PRN

## 2020-09-21 NOTE — Patient Instructions (Signed)
[facility] Discharge Instructions for Patients Receiving Chemotherapy  Today you received the following chemotherapy agents Keytruda  To help prevent nausea and vomiting after your treatment, we encourage you to take your nausea medication as directed   If you develop nausea and vomiting that is not controlled by your nausea medication, call the clinic.   BELOW ARE SYMPTOMS THAT SHOULD BE REPORTED IMMEDIATELY:  *FEVER GREATER THAN 100.5 F  *CHILLS WITH OR WITHOUT FEVER  NAUSEA AND VOMITING THAT IS NOT CONTROLLED WITH YOUR NAUSEA MEDICATION  *UNUSUAL SHORTNESS OF BREATH  *UNUSUAL BRUISING OR BLEEDING  TENDERNESS IN MOUTH AND THROAT WITH OR WITHOUT PRESENCE OF ULCERS  *URINARY PROBLEMS  *BOWEL PROBLEMS  UNUSUAL RASH Items with * indicate a potential emergency and should be followed up as soon as possible.  Feel free to call the clinic should you have any questions or concerns. The clinic phone number is [phone].  Please show the CHEMO ALERT CARD at check-in to the Emergency Department and triage nurse.  Pembrolizumab injection What is this medicine? PEMBROLIZUMAB (pem broe liz ue mab) is a monoclonal antibody. It is used to treat certain types of cancer. This medicine may be used for other purposes; ask your health care provider or pharmacist if you have questions. COMMON BRAND NAME(S): Keytruda What should I tell my health care provider before I take this medicine? They need to know if you have any of these conditions:  diabetes  immune system problems  inflammatory bowel disease  liver disease  lung or breathing disease  lupus  received or scheduled to receive an organ transplant or a stem-cell transplant that uses donor stem cells  an unusual or allergic reaction to pembrolizumab, other medicines, foods, dyes, or preservatives  pregnant or trying to get pregnant  breast-feeding How should I use this medicine? This medicine is for  infusion into a vein. It is given by a health care professional in a hospital or clinic setting. A special MedGuide will be given to you before each treatment. Be sure to read this information carefully each time. Talk to your pediatrician regarding the use of this medicine in children. While this drug may be prescribed for children as young as 6 months for selected conditions, precautions do apply. Overdosage: If you think you have taken too much of this medicine contact a poison control center or emergency room at once. NOTE: This medicine is only for you. Do not share this medicine with others. What if I miss a dose? It is important not to miss your dose. Call your doctor or health care professional if you are unable to keep an appointment. What may interact with this medicine? Interactions have not been studied. Give your health care provider a list of all the medicines, herbs, non-prescription drugs, or dietary supplements you use. Also tell them if you smoke, drink alcohol, or use illegal drugs. Some items may interact with your medicine. This list may not describe all possible interactions. Give your health care provider a list of all the medicines, herbs, non-prescription drugs, or dietary supplements you use. Also tell them if you smoke, drink alcohol, or use illegal drugs. Some items may interact with your medicine. What should I watch for while using this medicine? Your condition will be monitored carefully while you are receiving this medicine. You may need blood work done while you are taking this medicine. Do not become pregnant while taking this medicine or for 4 months after stopping it. Women should   inform their doctor if they wish to become pregnant or think they might be pregnant. There is a potential for serious side effects to an unborn child. Talk to your health care professional or pharmacist for more information. Do not breast-feed an infant while taking this medicine or for 4  months after the last dose. What side effects may I notice from receiving this medicine? Side effects that you should report to your doctor or health care professional as soon as possible:  allergic reactions like skin rash, itching or hives, swelling of the face, lips, or tongue  bloody or black, tarry  breathing problems  changes in vision  chest pain  chills  confusion  constipation  cough  diarrhea  dizziness or feeling faint or lightheaded  fast or irregular heartbeat  fever  flushing  joint pain  low blood counts - this medicine may decrease the number of white blood cells, red blood cells and platelets. You may be at increased risk for infections and bleeding.  muscle pain  muscle weakness  pain, tingling, numbness in the hands or feet  persistent headache  redness, blistering, peeling or loosening of the skin, including inside the mouth  signs and symptoms of high blood sugar such as dizziness; dry mouth; dry skin; fruity breath; nausea; stomach pain; increased hunger or thirst; increased urination  signs and symptoms of kidney injury like trouble passing urine or change in the amount of urine  signs and symptoms of liver injury like dark urine, light-colored stools, loss of appetite, nausea, right upper belly pain, yellowing of the eyes or skin  sweating  swollen lymph nodes  weight loss Side effects that usually do not require medical attention (report to your doctor or health care professional if they continue or are bothersome):  decreased appetite  hair loss  muscle pain  tiredness This list may not describe all possible side effects. Call your doctor for medical advice about side effects. You may report side effects to FDA at 1-800-FDA-1088. Where should I keep my medicine? This drug is given in a hospital or clinic and will not be stored at home. NOTE: This sheet is a summary. It may not cover all possible information. If you have  questions about this medicine, talk to your doctor, pharmacist, or health care provider.  2020 Elsevier/Gold Standard (2019-10-23 18:07:58)  

## 2020-10-03 DIAGNOSIS — Z8551 Personal history of malignant neoplasm of bladder: Secondary | ICD-10-CM | POA: Diagnosis not present

## 2020-10-03 DIAGNOSIS — Z936 Other artificial openings of urinary tract status: Secondary | ICD-10-CM | POA: Diagnosis not present

## 2020-10-04 DIAGNOSIS — Z936 Other artificial openings of urinary tract status: Secondary | ICD-10-CM | POA: Diagnosis not present

## 2020-10-04 DIAGNOSIS — Z8551 Personal history of malignant neoplasm of bladder: Secondary | ICD-10-CM | POA: Diagnosis not present

## 2020-10-10 ENCOUNTER — Telehealth: Payer: Self-pay

## 2020-10-10 NOTE — Telephone Encounter (Signed)
[name] called asking if patient can have labs drawn here for an upcoming appointment with Dr Alinda Money. Dr Lynne Logan office faxed over a request for a CMP and UA. Patient is already scheduled to have a CMP.  Sent message to Dr Alen Blew to ask about ordering a UA to prevent patient having to go out for another appointment as patient has been weak and having difficulty getting around. Left copy of order on Dr Hazeline Junker desk.

## 2020-10-11 ENCOUNTER — Telehealth: Payer: Self-pay

## 2020-10-11 ENCOUNTER — Other Ambulatory Visit: Payer: Self-pay

## 2020-10-11 DIAGNOSIS — C670 Malignant neoplasm of trigone of bladder: Secondary | ICD-10-CM

## 2020-10-11 NOTE — Telephone Encounter (Signed)
Add UA to labs on 10/13 per Dr Alen Blew.

## 2020-10-11 NOTE — Telephone Encounter (Signed)
-----   Message from Wyatt Portela, MD sent at 10/11/2020  7:24 AM EDT ----- Please add UA to our labs. Thanks ----- Message ----- From: Kennedy Bucker, LPN Sent: [id number]   1:13 PM EDT To: Wyatt Portela, MD  [name] called asking if labs for his visit to Dr Alinda Money can also be gotten at his lab appointment on 10/12/20. She wants to prevent him having to be stuck so many times as he is a difficult stick and he is weak and having trouble getting around and it would help to skip an extra appointment. Dr Lynne Logan office faxed over the lab order form. He is already in for a CMP, they also want an urinalysis. Please advise. Thank you.  Kim LPN

## 2020-10-12 ENCOUNTER — Inpatient Hospital Stay: Payer: Medicare PPO | Attending: Oncology

## 2020-10-12 ENCOUNTER — Inpatient Hospital Stay: Payer: Medicare PPO

## 2020-10-12 ENCOUNTER — Inpatient Hospital Stay: Payer: Medicare PPO | Admitting: Oncology

## 2020-10-12 ENCOUNTER — Other Ambulatory Visit: Payer: Self-pay

## 2020-10-12 ENCOUNTER — Telehealth: Payer: Self-pay

## 2020-10-12 ENCOUNTER — Other Ambulatory Visit: Payer: Medicare PPO

## 2020-10-12 VITALS — BP 127/52 | HR 92 | Temp 98.1°F | Resp 18 | Ht 67.0 in | Wt 243.3 lb

## 2020-10-12 VITALS — BP 128/67 | HR 75 | Temp 97.9°F | Resp 18

## 2020-10-12 DIAGNOSIS — Z9221 Personal history of antineoplastic chemotherapy: Secondary | ICD-10-CM | POA: Diagnosis not present

## 2020-10-12 DIAGNOSIS — C670 Malignant neoplasm of trigone of bladder: Secondary | ICD-10-CM

## 2020-10-12 DIAGNOSIS — Z5112 Encounter for antineoplastic immunotherapy: Secondary | ICD-10-CM | POA: Diagnosis not present

## 2020-10-12 DIAGNOSIS — C679 Malignant neoplasm of bladder, unspecified: Secondary | ICD-10-CM

## 2020-10-12 DIAGNOSIS — Z79899 Other long term (current) drug therapy: Secondary | ICD-10-CM | POA: Diagnosis not present

## 2020-10-12 LAB — URINALYSIS, COMPLETE (UACMP) WITH MICROSCOPIC
Bilirubin Urine: NEGATIVE
Glucose, UA: NEGATIVE mg/dL
Hgb urine dipstick: NEGATIVE
Ketones, ur: NEGATIVE mg/dL
Nitrite: POSITIVE — AB
Protein, ur: 30 mg/dL — AB
Specific Gravity, Urine: 1.013 (ref 1.005–1.030)
WBC, UA: >50 WBC/hpf — ABNORMAL HIGH (ref 0–5)
pH: 8 (ref 5.0–8.0)

## 2020-10-12 LAB — CBC WITH DIFFERENTIAL (CANCER CENTER ONLY)
Abs Immature Granulocytes: 0.01 10*3/uL (ref 0.00–0.07)
Basophils Absolute: 0 10*3/uL (ref 0.0–0.1)
Basophils Relative: 1 %
Eosinophils Absolute: 0.1 10*3/uL (ref 0.0–0.5)
Eosinophils Relative: 3 %
HCT: 24 % — ABNORMAL LOW (ref 39.0–52.0)
Hemoglobin: 7.1 g/dL — ABNORMAL LOW (ref 13.0–17.0)
Immature Granulocytes: 0 %
Lymphocytes Relative: 14 %
Lymphs Abs: 0.6 10*3/uL — ABNORMAL LOW (ref 0.7–4.0)
MCH: 27.5 pg (ref 26.0–34.0)
MCHC: 29.6 g/dL — ABNORMAL LOW (ref 30.0–36.0)
MCV: 93 fL (ref 80.0–100.0)
Monocytes Absolute: 0.3 10*3/uL (ref 0.1–1.0)
Monocytes Relative: 7 %
Neutro Abs: 3 10*3/uL (ref 1.7–7.7)
Neutrophils Relative %: 75 %
Platelet Count: 179 10*3/uL (ref 150–400)
RBC: 2.58 MIL/uL — ABNORMAL LOW (ref 4.22–5.81)
RDW: 16.1 % — ABNORMAL HIGH (ref 11.5–15.5)
WBC Count: 4 10*3/uL (ref 4.0–10.5)
nRBC: 0 % (ref 0.0–0.2)

## 2020-10-12 LAB — CMP (CANCER CENTER ONLY)
ALT: 20 U/L (ref 0–44)
AST: 37 U/L (ref 15–41)
Albumin: 3.1 g/dL — ABNORMAL LOW (ref 3.5–5.0)
Alkaline Phosphatase: 90 U/L (ref 38–126)
Anion gap: 8 (ref 5–15)
BUN: 18 mg/dL (ref 8–23)
CO2: 25 mmol/L (ref 22–32)
Calcium: 9.2 mg/dL (ref 8.9–10.3)
Chloride: 111 mmol/L (ref 98–111)
Creatinine: 1.62 mg/dL — ABNORMAL HIGH (ref 0.61–1.24)
GFR, Estimated: 41 mL/min — ABNORMAL LOW (ref >60)
Glucose, Bld: 217 mg/dL — ABNORMAL HIGH (ref 70–99)
Potassium: 4.5 mmol/L (ref 3.5–5.1)
Sodium: 144 mmol/L (ref 135–145)
Total Bilirubin: 0.3 mg/dL (ref 0.3–1.2)
Total Protein: 7.1 g/dL (ref 6.5–8.1)

## 2020-10-12 LAB — TSH: TSH: 2.446 u[IU]/mL (ref 0.320–4.118)

## 2020-10-12 LAB — PREPARE RBC (CROSSMATCH)

## 2020-10-12 MED ORDER — ACETAMINOPHEN 325 MG PO TABS
650.0000 mg | ORAL_TABLET | Freq: Once | ORAL | Status: AC
  Administered 2020-10-12: 650 mg via ORAL

## 2020-10-12 MED ORDER — SODIUM CHLORIDE 0.9% IV SOLUTION
250.0000 mL | Freq: Once | INTRAVENOUS | Status: AC
  Administered 2020-10-12: 250 mL via INTRAVENOUS
  Filled 2020-10-12: qty 250

## 2020-10-12 MED ORDER — DIPHENHYDRAMINE HCL 25 MG PO CAPS
25.0000 mg | ORAL_CAPSULE | Freq: Once | ORAL | Status: AC
  Administered 2020-10-12: 25 mg via ORAL

## 2020-10-12 MED ORDER — DIPHENHYDRAMINE HCL 25 MG PO CAPS
ORAL_CAPSULE | ORAL | Status: AC
  Filled 2020-10-12: qty 1

## 2020-10-12 MED ORDER — ACETAMINOPHEN 325 MG PO TABS
ORAL_TABLET | ORAL | Status: AC
  Filled 2020-10-12: qty 2

## 2020-10-12 MED ORDER — SODIUM CHLORIDE 0.9 % IV SOLN
Freq: Once | INTRAVENOUS | Status: AC
  Filled 2020-10-12: qty 250

## 2020-10-12 MED ORDER — SODIUM CHLORIDE 0.9 % IV SOLN
200.0000 mg | Freq: Once | INTRAVENOUS | Status: AC
  Administered 2020-10-12: 200 mg via INTRAVENOUS
  Filled 2020-10-12: qty 8

## 2020-10-12 NOTE — Progress Notes (Signed)
Hematology and Oncology Follow Up Visit  Jimmy [name] [id number] 07/04/1946 74 y.o. 10/12/2020 10:43 AM [name], MDLalonde, [name], MD   Principle Diagnosis: 74 year old man with stage IV high-grade urothelial carcinoma of the bladder diagnosed in July 2021 with lung involvement.  He was diagnosed in 2020 with T3aN0 high-grade urothelial carcinoma .    Prior Therapy: TURBT with right ureteral stent placement as well as left ureteral stent placement at that time.  The final pathology showed infiltrating high-grade papillary adenocarcinoma invading into the detrusor muscle with lymphovascular invasion identified.  This was completed on 06/22/2019.    Neoadjuvant chemotherapy started on August 07, 2019 with cisplatin and gemcitabine started on August 07, 2019.  He completed 4 cycles of chemotherapy on October 16, 2019.  He is status post radical cystectomy and lymph node dissection on December 10, 2019.  The final pathology showed 1.5 cm residual tumor with invasion into the perivesicular soft tissue.  Final pathological stage was T3a and 0 with 0 out of 10 lymph nodes involved.  Current therapy: Pembrolizumab 200 mg every 3 weeks started on August 10, 2020.  He is here for cycle 4 of therapy.  Interim History: Jimmy [name] is here for repeat evaluation.  Since the last visit, he reports no major changes in his health.  He has reported periodic episodes of hematuria that has been rather heavy and lasted close to 21 days.  His bleeding has tapered off currently his urine is clear.  He has reported some fatigue and tiredness but no dyspnea on exertion or chest pain.  He does report lower extremity edema.  Formal status is limited in his mobility has also not changed.                Medications: Updated on review. Current Outpatient Medications  Medication Sig Dispense Refill  . Accu-Chek Softclix Lancets lancets 1 each by Other route in the morning, at noon, and at bedtime.  Use as instructed 300 each 2  . acidophilus (RISAQUAD) CAPS capsule Take 1 capsule by mouth daily.    . celecoxib (CELEBREX) 200 MG capsule Take 200 mg by mouth daily.    . COMBIVENT RESPIMAT 20-100 MCG/ACT AERS respimat USE 1 PUFF 4 TIMES A DAY RINSE MOUTH AFTER USE (Patient taking differently: Inhale 1 puff into the lungs in the morning, at noon, in the evening, and at bedtime. Rinse mouth after use) 4 g 5  . diclofenac Sodium (VOLTAREN) 1 % GEL Apply topically as needed.    [name] ELIQUIS 5 MG TABS tablet TAKE 1 TABLET BY MOUTH TWICE A DAY 180 tablet 1  . ferrous sulfate 325 (65 FE) MG tablet Take 325 mg by mouth daily with breakfast.    . glucose blood test strip 1 each by Other route in the morning, at noon, and at bedtime. Use as instructed 300 each 2  . hydrOXYzine (ATARAX/VISTARIL) 10 MG tablet Take 1 tablet (10 mg total) by mouth 3 (three) times daily as needed. 30 tablet 2  . insulin detemir (LEVEMIR) 100 UNIT/ML injection Inject 16 Units into the skin daily.    . Insulin Pen Needle 32G X 4 MM MISC Use for injections twice daily 200 each 1  . metFORMIN (GLUCOPHAGE) 1000 MG tablet Take 1,000 mg by mouth daily.     . metoprolol tartrate (LOPRESSOR) 25 MG tablet TAKE 1/2 TABLET BY MOUTH TWICE A DAY 90 tablet 1  . Multiple Vitamin (MULTIVITAMIN WITH MINERALS) TABS tablet Take 1  tablet by mouth daily. 30 tablet 0  . pantoprazole (PROTONIX) 40 MG tablet TAKE 1 TABLET BY MOUTH TWICE A DAY 180 tablet 1   No current facility-administered medications for this visit.     Allergies:  Allergies  Allergen Reactions  . Ace Inhibitors Cough  . Cephalexin Hives, Diarrhea and Nausea Only    Tolerates Ancef, Rocephin, Cefoxitin  . Crestor [Rosuvastatin Calcium]   . Lipitor [Atorvastatin Calcium] Other (See Comments)    MUSCLE PAINS   . Plavix [Clopidogrel Bisulfate] Hives  . Vancomycin Nausea And Vomiting    Po route caused n/v.        Physical Exam:    Blood pressure (!) 127/52, pulse  92, temperature 98.1 F (36.7 C), temperature source Tympanic, resp. rate 18, height 5\' 7"  (1.702 m), weight 243 lb 4.8 oz (110.4 kg), SpO2 96 %.      ECOG: 2   General appearance: Alert, awake without any distress. Head: Atraumatic without abnormalities Oropharynx: Without any thrush or ulcers. Eyes: No scleral icterus. Lymph nodes: No lymphadenopathy noted in the cervical, supraclavicular, or axillary nodes Heart:regular rate and rhythm, without any murmurs or gallops.   Lung: Clear to auscultation without any rhonchi, wheezes or dullness to percussion. Abdomin: Soft, nontender without any shifting dullness or ascites. Musculoskeletal: No clubbing or cyanosis. Neurological: No motor or sensory deficits. Skin: No rashes or lesions.            Lab Results: Lab Results  Component Value Date   WBC 4.0 10/12/2020   HGB 7.1 (L) 10/12/2020   HCT 24.0 (L) 10/12/2020   MCV 93.0 10/12/2020   PLT 179 10/12/2020     Chemistry      Component Value Date/Time   NA 140 09/21/2020 0944   NA 140 08/11/2020 1451   K 4.3 09/21/2020 0944   CL 108 09/21/2020 0944   CO2 25 09/21/2020 0944   BUN 25 (H) 09/21/2020 0944   BUN 20 08/11/2020 1451   CREATININE 1.35 (H) 09/21/2020 0944   CREATININE 0.89 11/19/2016 0920      Component Value Date/Time   CALCIUM 9.6 09/21/2020 0944   ALKPHOS 83 09/21/2020 0944   AST 48 (H) 09/21/2020 0944   ALT 27 09/21/2020 0944   BILITOT 0.5 09/21/2020 0944       Impression and Plan:   74 year old man with:  1.  Bladder cancer diagnosed in 2020.  He developed stage IV lung involvement diagnosed in July 2021.  Risks and benefits of continuing his current treatment with Pembrolizumab were reviewed today.  Complication associated with this therapy including immune mediated complications, GI toxicity and pruritus.  He is agreeable to continue at this time.  Plan is to repeat imaging studies before the next visit.   2. IV access:Peripheral  veins currently in use Port-A-Cath reinsertion has been deferred for the time being.  3.  Anemia: His hemoglobin dropped significantly since the last visit.  This is related to blood loss from his hematuria.  Risks and benefits of packed red cell transfusion were discussed.  He is agreeable to receive 2 units.  4.  Immune mediated complications.  He is complication occluding pneumonitis, colitis and thyroid disease were reiterated.  He has not experienced any at this time.  5. Follow-up:  Will be in 3 weeks for repeat evaluation.  30  minutes were spent on this encounter.  Time was dedicated to reviewing his disease status, discussing treatment options and future plan of care review.  Zola Button, MD 10/13/202110:43 AM

## 2020-10-12 NOTE — Patient Instructions (Signed)
Cancer Center Discharge Instructions for Patients Receiving Chemotherapy  Today you received the following chemotherapy agents Keytruda  To help prevent nausea and vomiting after your treatment, we encourage you to take your nausea medication as directed   If you develop nausea and vomiting that is not controlled by your nausea medication, call the clinic.   BELOW ARE SYMPTOMS THAT SHOULD BE REPORTED IMMEDIATELY:  *FEVER GREATER THAN 100.5 F  *CHILLS WITH OR WITHOUT FEVER  NAUSEA AND VOMITING THAT IS NOT CONTROLLED WITH YOUR NAUSEA MEDICATION  *UNUSUAL SHORTNESS OF BREATH  *UNUSUAL BRUISING OR BLEEDING  TENDERNESS IN MOUTH AND THROAT WITH OR WITHOUT PRESENCE OF ULCERS  *URINARY PROBLEMS  *BOWEL PROBLEMS  UNUSUAL RASH Items with * indicate a potential emergency and should be followed up as soon as possible.  Feel free to call the clinic should you have any questions or concerns. The clinic phone number is [phone].  Please show the CHEMO ALERT CARD at check-in to the Emergency Department and triage nurse.  Pembrolizumab injection What is this medicine? PEMBROLIZUMAB (pem broe liz ue mab) is a monoclonal antibody. It is used to treat certain types of cancer. This medicine may be used for other purposes; ask your health care provider or pharmacist if you have questions. COMMON BRAND NAME(S): Keytruda What should I tell my health care provider before I take this medicine? They need to know if you have any of these conditions:  diabetes  immune system problems  inflammatory bowel disease  liver disease  lung or breathing disease  lupus  received or scheduled to receive an organ transplant or a stem-cell transplant that uses donor stem cells  an unusual or allergic reaction to pembrolizumab, other medicines, foods, dyes, or preservatives  pregnant or trying to get pregnant  breast-feeding How should I use this medicine? This medicine is for  infusion into a vein. It is given by a health care professional in a hospital or clinic setting. A special MedGuide will be given to you before each treatment. Be sure to read this information carefully each time. Talk to your pediatrician regarding the use of this medicine in children. While this drug may be prescribed for children as young as 6 months for selected conditions, precautions do apply. Overdosage: If you think you have taken too much of this medicine contact a poison control center or emergency room at once. NOTE: This medicine is only for you. Do not share this medicine with others. What if I miss a dose? It is important not to miss your dose. Call your doctor or health care professional if you are unable to keep an appointment. What may interact with this medicine? Interactions have not been studied. Give your health care provider a list of all the medicines, herbs, non-prescription drugs, or dietary supplements you use. Also tell them if you smoke, drink alcohol, or use illegal drugs. Some items may interact with your medicine. This list may not describe all possible interactions. Give your health care provider a list of all the medicines, herbs, non-prescription drugs, or dietary supplements you use. Also tell them if you smoke, drink alcohol, or use illegal drugs. Some items may interact with your medicine. What should I watch for while using this medicine? Your condition will be monitored carefully while you are receiving this medicine. You may need blood work done while you are taking this medicine. Do not become pregnant while taking this medicine or for 4 months after stopping it. Women should   inform their doctor if they wish to become pregnant or think they might be pregnant. There is a potential for serious side effects to an unborn child. Talk to your health care professional or pharmacist for more information. Do not breast-feed an infant while taking this medicine or for 4  months after the last dose. What side effects may I notice from receiving this medicine? Side effects that you should report to your doctor or health care professional as soon as possible:  allergic reactions like skin rash, itching or hives, swelling of the face, lips, or tongue  bloody or black, tarry  breathing problems  changes in vision  chest pain  chills  confusion  constipation  cough  diarrhea  dizziness or feeling faint or lightheaded  fast or irregular heartbeat  fever  flushing  joint pain  low blood counts - this medicine may decrease the number of white blood cells, red blood cells and platelets. You may be at increased risk for infections and bleeding.  muscle pain  muscle weakness  pain, tingling, numbness in the hands or feet  persistent headache  redness, blistering, peeling or loosening of the skin, including inside the mouth  signs and symptoms of high blood sugar such as dizziness; dry mouth; dry skin; fruity breath; nausea; stomach pain; increased hunger or thirst; increased urination  signs and symptoms of kidney injury like trouble passing urine or change in the amount of urine  signs and symptoms of liver injury like dark urine, light-colored stools, loss of appetite, nausea, right upper belly pain, yellowing of the eyes or skin  sweating  swollen lymph nodes  weight loss Side effects that usually do not require medical attention (report to your doctor or health care professional if they continue or are bothersome):  decreased appetite  hair loss  muscle pain  tiredness This list may not describe all possible side effects. Call your doctor for medical advice about side effects. You may report side effects to FDA at 1-800-FDA-1088. Where should I keep my medicine? This drug is given in a hospital or clinic and will not be stored at home. NOTE: This sheet is a summary. It may not cover all possible information. If you have  questions about this medicine, talk to your doctor, pharmacist, or health care provider.  2020 Elsevier/Gold Standard (2019-10-23 18:07:58)  

## 2020-10-12 NOTE — Telephone Encounter (Signed)
Dr. Alen Blew reviewed today's labs and per Dr. Alen Blew it is ok to treat today. Patient will also receive 2 units of blood today per Dr. Alen Blew. Patient is aware and infusion RN is aware.

## 2020-10-13 LAB — TYPE AND SCREEN
ABO/RH(D): O POS
Antibody Screen: NEGATIVE
Unit division: 0
Unit division: 0

## 2020-10-13 LAB — BPAM RBC
Blood Product Expiration Date: 202111022359
Blood Product Expiration Date: 202111062359
ISSUE DATE / TIME: 202110131240
ISSUE DATE / TIME: 202110131240
Unit Type and Rh: 5100
Unit Type and Rh: 5100

## 2020-10-14 DIAGNOSIS — J449 Chronic obstructive pulmonary disease, unspecified: Secondary | ICD-10-CM | POA: Diagnosis not present

## 2020-10-14 DIAGNOSIS — U071 COVID-19: Secondary | ICD-10-CM | POA: Diagnosis not present

## 2020-10-21 ENCOUNTER — Telehealth: Payer: Self-pay | Admitting: *Deleted

## 2020-10-21 NOTE — Telephone Encounter (Signed)
Noted  

## 2020-10-21 NOTE — Telephone Encounter (Signed)
Wife called to report some bleeding from patients urostomy bag.  States that is not a lot but every 3 days a small amount is coming through his urostomy bag.    Routed to MD to make aware.

## 2020-10-25 DIAGNOSIS — C678 Malignant neoplasm of overlapping sites of bladder: Secondary | ICD-10-CM | POA: Diagnosis not present

## 2020-10-25 DIAGNOSIS — R310 Gross hematuria: Secondary | ICD-10-CM | POA: Diagnosis not present

## 2020-10-27 ENCOUNTER — Other Ambulatory Visit: Payer: Self-pay

## 2020-10-27 ENCOUNTER — Ambulatory Visit (HOSPITAL_COMMUNITY)
Admission: RE | Admit: 2020-10-27 | Discharge: 2020-10-27 | Disposition: A | Discharge status: Home / Self Care | Payer: Medicare PPO | Source: Ambulatory Visit | Attending: Oncology | Admitting: Oncology

## 2020-10-27 DIAGNOSIS — I2510 Atherosclerotic heart disease of native coronary artery without angina pectoris: Secondary | ICD-10-CM | POA: Diagnosis not present

## 2020-10-27 DIAGNOSIS — M47814 Spondylosis without myelopathy or radiculopathy, thoracic region: Secondary | ICD-10-CM | POA: Diagnosis not present

## 2020-10-27 DIAGNOSIS — N1330 Unspecified hydronephrosis: Secondary | ICD-10-CM | POA: Diagnosis not present

## 2020-10-27 DIAGNOSIS — C7800 Secondary malignant neoplasm of unspecified lung: Secondary | ICD-10-CM | POA: Diagnosis not present

## 2020-10-27 DIAGNOSIS — K7460 Unspecified cirrhosis of liver: Secondary | ICD-10-CM | POA: Diagnosis not present

## 2020-10-27 DIAGNOSIS — C679 Malignant neoplasm of bladder, unspecified: Secondary | ICD-10-CM | POA: Diagnosis not present

## 2020-10-27 DIAGNOSIS — C670 Malignant neoplasm of trigone of bladder: Secondary | ICD-10-CM | POA: Diagnosis not present

## 2020-10-27 DIAGNOSIS — J439 Emphysema, unspecified: Secondary | ICD-10-CM | POA: Diagnosis not present

## 2020-10-28 ENCOUNTER — Encounter: Payer: Self-pay | Admitting: Family Medicine

## 2020-10-28 ENCOUNTER — Ambulatory Visit (INDEPENDENT_AMBULATORY_CARE_PROVIDER_SITE_OTHER): Payer: Medicare PPO | Admitting: Family Medicine

## 2020-10-28 ENCOUNTER — Telehealth: Payer: Self-pay | Admitting: Oncology

## 2020-10-28 VITALS — BP 108/62 | HR 74 | Temp 99.2°F | Wt 237.0 lb

## 2020-10-28 DIAGNOSIS — E118 Type 2 diabetes mellitus with unspecified complications: Secondary | ICD-10-CM

## 2020-10-28 DIAGNOSIS — C7800 Secondary malignant neoplasm of unspecified lung: Secondary | ICD-10-CM | POA: Diagnosis not present

## 2020-10-28 DIAGNOSIS — C679 Malignant neoplasm of bladder, unspecified: Secondary | ICD-10-CM | POA: Diagnosis not present

## 2020-10-28 DIAGNOSIS — Z8616 Personal history of COVID-19: Secondary | ICD-10-CM

## 2020-10-28 DIAGNOSIS — Z906 Acquired absence of other parts of urinary tract: Secondary | ICD-10-CM | POA: Diagnosis not present

## 2020-10-28 DIAGNOSIS — Z9079 Acquired absence of other genital organ(s): Secondary | ICD-10-CM

## 2020-10-28 DIAGNOSIS — Z23 Encounter for immunization: Secondary | ICD-10-CM

## 2020-10-28 LAB — POCT GLYCOSYLATED HEMOGLOBIN (HGB A1C): Hemoglobin A1C: 5.8 % — AB (ref 4.0–5.6)

## 2020-10-28 NOTE — Patient Instructions (Signed)
Cut down on the Levemir by 2 units every couple of days as long as the morning blood sugar stays around 120-130.  When the Levemir is at 10 and the numbers are still good then we can stop

## 2020-10-28 NOTE — Progress Notes (Signed)
  Subjective:    Patient ID: Jimmy [name], male    DOB: Mar 23, 1946, 74 y.o.   MRN: [id number]  Jimmy [name] is a 74 y.o. male who presents for follow-up of Type 2 diabetes mellitus.  Home blood sugar records: meter record, fasting and post meal, 99-138 Current symptoms/problems include none at this time . Daily foot checks: yes   Any foot concerns: none Exercise: as much as he can Diet: fair  He has had recent CT scan which did show worsening of his metastatic spread to the lungs.  He did have Covid earlier this year.  He continues on oxygen.  He does desaturate with minimal physical activity down to 87.  He continues on Levemir 16 units and 1000 mg of Metformin per day.  He continues on Protonix, metoprolol.  He rarely uses hydroxyzine with pruritus secondary to chemotherapy.[name]  He continues on Combivent. The following portions of the patient's history were reviewed and updated as appropriate: allergies, current medications, past medical history, past social history and problem list.  ROS as in subjective above.     Objective:    Physical Exam Alert and in no distress otherwise not examined. Hemoglobin A1c is 5.8  Lab Review Diabetic Labs Latest Ref Rng & Units 10/12/2020 09/21/2020 08/31/2020 08/11/2020 08/09/2020  HbA1c 4.0 - 5.6 % - - - - -  Microalbumin mg/L - - - - -  Micro/Creat Ratio - - - - - -  Chol 100 - 199 mg/dL - - - - -  HDL >39 mg/dL - - - - -  Calc LDL 0 - 99 mg/dL - - - - -  Triglycerides 0 - 149 mg/dL - - - - -  Creatinine 0.61 - 1.24 mg/dL 1.62(H) 1.35(H) 1.15 1.18 1.32(H)   BP/Weight 10/12/2020 10/12/2020 09/21/2020 08/31/2020 1/82/9937  Systolic BP 169 678 938 101 751  Diastolic BP 67 52 68 58 54  Wt. (Lbs) - 243.3 234.7 226.6 192.75  BMI - 38.11 36.76 35.49 30.19   Foot/eye exam completion dates Latest Ref Rng & Units 01/30/2018 11/01/2016  Eye Exam No Retinopathy No Retinopathy No Retinopathy  Foot Form Completion - - -    Jimmy [name]  reports that he quit  smoking about 21 years ago. His smoking use included cigarettes. He has a 40.00 pack-year smoking history. He has never used smokeless tobacco. He reports current alcohol use of about 1.0 standard drink of alcohol per week. He reports that he does not use drugs.     Assessment & Plan:    Type II diabetes mellitus with complication ([place]) - Plan: POCT glycosylated hemoglobin (Hb A1C)  S/P radical cystoprostatectomy - Ileal conduit urinary diversion  Malignant neoplasm of urinary bladder, unspecified site [facility])  History of COVID-19  Malignant neoplasm metastatic to lung, unspecified laterality ([place])  Need for influenza vaccination - Plan: Flu Vaccine QUAD High Dose(Fluad)   1. Rx changes: We will have him cut back on the Levemir since his A1c is so good.  She will taper this by 2 units every few days to keep his blood sugar in the 120 range.  She will call if she has questions. 2. Education: Reviewed 'ABCs' of diabetes management (respective goals in parentheses):  A1C (<7), blood pressure (<130/80), and cholesterol (LDL <100). 3. Compliance at present is estimated to be good. Efforts to improve compliance (if necessary) will be directed at Maintenance. 4. Follow up: 4 months

## 2020-10-28 NOTE — Telephone Encounter (Signed)
Scheduled per los, patient has been called and voicemail was left. 

## 2020-11-02 ENCOUNTER — Inpatient Hospital Stay: Payer: Medicare PPO | Attending: Oncology

## 2020-11-02 ENCOUNTER — Other Ambulatory Visit: Payer: Self-pay

## 2020-11-02 ENCOUNTER — Inpatient Hospital Stay: Payer: Medicare PPO

## 2020-11-02 ENCOUNTER — Inpatient Hospital Stay: Payer: Medicare PPO | Admitting: Oncology

## 2020-11-02 VITALS — BP 127/65 | HR 77 | Temp 97.7°F | Resp 18 | Ht 67.0 in | Wt 240.9 lb

## 2020-11-02 DIAGNOSIS — C679 Malignant neoplasm of bladder, unspecified: Secondary | ICD-10-CM | POA: Diagnosis not present

## 2020-11-02 DIAGNOSIS — C7800 Secondary malignant neoplasm of unspecified lung: Secondary | ICD-10-CM | POA: Diagnosis not present

## 2020-11-02 DIAGNOSIS — Z79899 Other long term (current) drug therapy: Secondary | ICD-10-CM | POA: Insufficient documentation

## 2020-11-02 DIAGNOSIS — Z9221 Personal history of antineoplastic chemotherapy: Secondary | ICD-10-CM | POA: Diagnosis not present

## 2020-11-02 DIAGNOSIS — D649 Anemia, unspecified: Secondary | ICD-10-CM | POA: Insufficient documentation

## 2020-11-02 DIAGNOSIS — Z5112 Encounter for antineoplastic immunotherapy: Secondary | ICD-10-CM | POA: Insufficient documentation

## 2020-11-02 LAB — CMP (CANCER CENTER ONLY)
ALT: 20 U/L (ref 0–44)
AST: 35 U/L (ref 15–41)
Albumin: 3.3 g/dL — ABNORMAL LOW (ref 3.5–5.0)
Alkaline Phosphatase: 85 U/L (ref 38–126)
Anion gap: 8 (ref 5–15)
BUN: 28 mg/dL — ABNORMAL HIGH (ref 8–23)
CO2: 21 mmol/L — ABNORMAL LOW (ref 22–32)
Calcium: 9.4 mg/dL (ref 8.9–10.3)
Chloride: 110 mmol/L (ref 98–111)
Creatinine: 1.52 mg/dL — ABNORMAL HIGH (ref 0.61–1.24)
GFR, Estimated: 48 mL/min — ABNORMAL LOW (ref >60)
Glucose, Bld: 140 mg/dL — ABNORMAL HIGH (ref 70–99)
Potassium: 4.3 mmol/L (ref 3.5–5.1)
Sodium: 139 mmol/L (ref 135–145)
Total Bilirubin: 0.5 mg/dL (ref 0.3–1.2)
Total Protein: 7.5 g/dL (ref 6.5–8.1)

## 2020-11-02 LAB — TSH: TSH: 1.873 u[IU]/mL (ref 0.320–4.118)

## 2020-11-02 LAB — CBC WITH DIFFERENTIAL (CANCER CENTER ONLY)
Abs Immature Granulocytes: 0.02 10*3/uL (ref 0.00–0.07)
Basophils Absolute: 0 10*3/uL (ref 0.0–0.1)
Basophils Relative: 1 %
Eosinophils Absolute: 0.1 10*3/uL (ref 0.0–0.5)
Eosinophils Relative: 3 %
HCT: 32 % — ABNORMAL LOW (ref 39.0–52.0)
Hemoglobin: 9.6 g/dL — ABNORMAL LOW (ref 13.0–17.0)
Immature Granulocytes: 0 %
Lymphocytes Relative: 11 %
Lymphs Abs: 0.6 10*3/uL — ABNORMAL LOW (ref 0.7–4.0)
MCH: 27.5 pg (ref 26.0–34.0)
MCHC: 30 g/dL (ref 30.0–36.0)
MCV: 91.7 fL (ref 80.0–100.0)
Monocytes Absolute: 0.5 10*3/uL (ref 0.1–1.0)
Monocytes Relative: 9 %
Neutro Abs: 4 10*3/uL (ref 1.7–7.7)
Neutrophils Relative %: 76 %
Platelet Count: 145 10*3/uL — ABNORMAL LOW (ref 150–400)
RBC: 3.49 MIL/uL — ABNORMAL LOW (ref 4.22–5.81)
RDW: 16.5 % — ABNORMAL HIGH (ref 11.5–15.5)
WBC Count: 5.2 10*3/uL (ref 4.0–10.5)
nRBC: 0 % (ref 0.0–0.2)

## 2020-11-02 MED ORDER — SODIUM CHLORIDE 0.9 % IV SOLN
Freq: Once | INTRAVENOUS | Status: AC
  Filled 2020-11-02: qty 250

## 2020-11-02 MED ORDER — SODIUM CHLORIDE 0.9 % IV SOLN
200.0000 mg | Freq: Once | INTRAVENOUS | Status: AC
  Administered 2020-11-02: 200 mg via INTRAVENOUS
  Filled 2020-11-02: qty 8

## 2020-11-02 NOTE — Patient Instructions (Signed)
[facility] Discharge Instructions for Patients Receiving Chemotherapy  Today you received the following chemotherapy agents:  Keytruda.  To help prevent nausea and vomiting after your treatment, we encourage you to take your nausea medication as directed.   If you develop nausea and vomiting that is not controlled by your nausea medication, call the clinic.   BELOW ARE SYMPTOMS THAT SHOULD BE REPORTED IMMEDIATELY:  *FEVER GREATER THAN 100.5 F  *CHILLS WITH OR WITHOUT FEVER  NAUSEA AND VOMITING THAT IS NOT CONTROLLED WITH YOUR NAUSEA MEDICATION  *UNUSUAL SHORTNESS OF BREATH  *UNUSUAL BRUISING OR BLEEDING  TENDERNESS IN MOUTH AND THROAT WITH OR WITHOUT PRESENCE OF ULCERS  *URINARY PROBLEMS  *BOWEL PROBLEMS  UNUSUAL RASH Items with * indicate a potential emergency and should be followed up as soon as possible.  Feel free to call the clinic should you have any questions or concerns. The clinic phone number is [phone].  Please show the CHEMO ALERT CARD at check-in to the Emergency Department and triage nurse.    

## 2020-11-02 NOTE — Progress Notes (Signed)
Per Dr. Alen Blew, ok to treat with Scr 1.52.

## 2020-11-02 NOTE — Progress Notes (Signed)
Hematology and Oncology Follow Up Visit  Jimmy [name] [id number] 09-20-1946 74 y.o. 11/02/2020 10:50 AM Jimmy [name], MDLalonde, [name], MD   Principle Diagnosis: 74 year old man with bladder cancer diagnosed in 2020.  He was initially found to have localized disease and subsequently developed stage IV high-grade urothelial carcinoma with pulmonary involvement diagnosed in July 2021.   Prior Therapy: TURBT with right ureteral stent placement as well as left ureteral stent placement at that time.  The final pathology showed infiltrating high-grade papillary adenocarcinoma invading into the detrusor muscle with lymphovascular invasion identified.  This was completed on 06/22/2019.    Neoadjuvant chemotherapy started on August 07, 2019 with cisplatin and gemcitabine started on August 07, 2019.  He completed 4 cycles of chemotherapy on October 16, 2019.  He is status post radical cystectomy and lymph node dissection on December 10, 2019.  The final pathology showed 1.5 cm residual tumor with invasion into the perivesicular soft tissue.  Final pathological stage was T3a and 0 with 0 out of 10 lymph nodes involved.  Current therapy: Pembrolizumab 200 mg every 3 weeks started on August 10, 2020.  He is here for cycle 5 of therapy.  Interim History: Jimmy [name] presents today for a repeat follow-up.  Since the last visit, he reports no major changes in his health.  He denies any recent hospitalization or illnesses.  He continues to tolerate Pembrolizumab without any complaints.  He denies any nausea vomiting or abdominal pain.  He is eating well without any worsening pneumaturia.  He does report occasional discoloration of his urostomy bag.                Medications: Unchanged on review. Current Outpatient Medications  Medication Sig Dispense Refill  . Accu-Chek Softclix Lancets lancets 1 each by Other route in the morning, at noon, and at bedtime. Use as instructed 300 each 2  .  acidophilus (RISAQUAD) CAPS capsule Take 1 capsule by mouth daily.    . celecoxib (CELEBREX) 200 MG capsule Take 200 mg by mouth daily.    . COMBIVENT RESPIMAT 20-100 MCG/ACT AERS respimat USE 1 PUFF 4 TIMES A DAY RINSE MOUTH AFTER USE (Patient taking differently: Inhale 1 puff into the lungs in the morning, at noon, in the evening, and at bedtime. Rinse mouth after use) 4 g 5  . diclofenac Sodium (VOLTAREN) 1 % GEL Apply topically as needed.    [name] ELIQUIS 5 MG TABS tablet TAKE 1 TABLET BY MOUTH TWICE A DAY 180 tablet 1  . ferrous sulfate 325 (65 FE) MG tablet Take 325 mg by mouth daily with breakfast.    . glucose blood test strip 1 each by Other route in the morning, at noon, and at bedtime. Use as instructed 300 each 2  . hydrOXYzine (ATARAX/VISTARIL) 10 MG tablet Take 1 tablet (10 mg total) by mouth 3 (three) times daily as needed. 30 tablet 2  . insulin detemir (LEVEMIR) 100 UNIT/ML injection Inject 16 Units into the skin daily.    . Insulin Pen Needle 32G X 4 MM MISC Use for injections twice daily 200 each 1  . metFORMIN (GLUCOPHAGE) 1000 MG tablet Take 1,000 mg by mouth daily.     . metoprolol tartrate (LOPRESSOR) 25 MG tablet TAKE 1/2 TABLET BY MOUTH TWICE A DAY 90 tablet 1  . Multiple Vitamin (MULTIVITAMIN WITH MINERALS) TABS tablet Take 1 tablet by mouth daily. 30 tablet 0  . pantoprazole (PROTONIX) 40 MG tablet TAKE 1 TABLET BY  MOUTH TWICE A DAY 180 tablet 1   No current facility-administered medications for this visit.     Allergies:  Allergies  Allergen Reactions  . Ace Inhibitors Cough  . Cephalexin Hives, Diarrhea and Nausea Only    Tolerates Ancef, Rocephin, Cefoxitin  . Crestor [Rosuvastatin Calcium]   . Lipitor [Atorvastatin Calcium] Other (See Comments)    MUSCLE PAINS   . Plavix [Clopidogrel Bisulfate] Hives  . Vancomycin Nausea And Vomiting    Po route caused n/v.        Physical Exam:     Blood pressure 127/65, pulse 77, temperature 97.7 F (36.5 C),  temperature source Tympanic, resp. rate 18, height 5\' 7"  (1.702 m), weight 240 lb 14.4 oz (109.3 kg), SpO2 98 %.      ECOG: 2    General appearance: Comfortable appearing without any discomfort Head: Normocephalic without any trauma Oropharynx: Mucous membranes are moist and pink without any thrush or ulcers. Eyes: Pupils are equal and round reactive to light. Lymph nodes: No cervical, supraclavicular, inguinal or axillary lymphadenopathy.   Heart:regular rate and rhythm.  S1 and S2 without leg edema. [name]: Clear without any rhonchi or wheezes.  No dullness to percussion. Abdomin: Soft, nontender, nondistended with good bowel sounds.  No hepatosplenomegaly. Musculoskeletal: No joint deformity or effusion.  Full range of motion noted. Neurological: No deficits noted on motor, sensory and deep tendon reflex exam. Skin: No petechial rash or dryness.  Appeared moist.              Lab Results: Lab Results  Component Value Date   WBC 5.2 11/02/2020   HGB 9.6 (L) 11/02/2020   HCT 32.0 (L) 11/02/2020   MCV 91.7 11/02/2020   PLT 145 (L) 11/02/2020     Chemistry      Component Value Date/Time   NA 144 10/12/2020 1024   NA 140 08/11/2020 1451   K 4.5 10/12/2020 1024   CL 111 10/12/2020 1024   CO2 25 10/12/2020 1024   BUN 18 10/12/2020 1024   BUN 20 08/11/2020 1451   CREATININE 1.62 (H) 10/12/2020 1024   CREATININE 0.89 11/19/2016 0920      Component Value Date/Time   CALCIUM 9.2 10/12/2020 1024   ALKPHOS 90 10/12/2020 1024   AST 37 10/12/2020 1024   ALT 20 10/12/2020 1024   BILITOT 0.3 10/12/2020 1024     IMPRESSION: 1. Interval progression of pulmonary metastases. 2. Stable mild subcarinal lymphadenopathy. 3. No evidence of metastatic disease in the abdomen or pelvis. 4. Chronic mild right hydroureteronephrosis and mild left ureterectasis, stable. Normal nondistended ileal conduit. 5. Cirrhosis. Moderate splenomegaly, mildly increased. No ascites. 6.  Chronic findings include: Coronary atherosclerosis. Aortic Atherosclerosis (ICD10-I70.0) and Emphysema (ICD10-J43.9).   Impression and Plan:   74 year old man with:  1.  Stage IV high-grade urothelial carcinoma of the bladder with pulmonary involvement diagnosed in July 2021.    He continues to receive palliative therapy with Pembrolizumab without any major complications.  CT scan obtained on October 28, 2020 was personally reviewed and discussed with the patient.  He has an overall progression of disease in his pulmonary metastasis including increase in the pulmonary nodules.  The increases is mild but definitely noticeable but no other area of metastasis noted including no bone or visceral metastasis.  The natural course of this disease was reviewed and treatment options were discussed.  Discontinuation of therapy and hospice enrollment were discussed versus switching to different agent such as Padcev.  Complication associated  with this therapy to include nausea, fatigue, myelosuppression were reviewed and the he is not interested in cytotoxic chemotherapy.  The option of continuing Pembrolizumab was also reviewed given his minimal progression and he favors this approach at this time.  Consideration for hospice if his neck scan showed worsening metastatic disease.  The plan is to proceed with 4 more cycles and repeat imaging studies after that.   2. IV access:Peripheral veins remains in use without any issues.  3.  Anemia: Improved after transfusion.  This is related to hematuria.  4.  Immune mediated complications.  No complications noted related to immunotherapy.  These issues include pneumonitis, colitis and thyroid disease were reiterated.  5. Follow-up:  In 3 weeks for the next cycle of therapy.  30  minutes were dedicated to this visit.  Time spent on reviewing his disease status, discussing imaging studies and alternative treatment options for the future.      Zola Button, MD 11/3/202110:50 AM

## 2020-11-03 ENCOUNTER — Other Ambulatory Visit: Payer: Self-pay

## 2020-11-03 ENCOUNTER — Telehealth: Payer: Self-pay | Admitting: Family Medicine

## 2020-11-03 ENCOUNTER — Other Ambulatory Visit: Payer: Self-pay | Admitting: Family Medicine

## 2020-11-03 DIAGNOSIS — R2689 Other abnormalities of gait and mobility: Secondary | ICD-10-CM

## 2020-11-03 NOTE — Telephone Encounter (Signed)
[facility] to [facility] inhaler and levemir. Please advise due to cahnges made at last appointment. [place]

## 2020-11-03 NOTE — Telephone Encounter (Signed)
Please take care of this.  

## 2020-11-03 NOTE — Telephone Encounter (Signed)
Received call from [place], Jimmy [name]'s scan results did not come back as they hoped they would. No spreading outside of lungs but no shrinking either. Planning to try another round of same treatment since it seems to at least be keeping it from [place].  Jimmy [name] is having trouble getting around again and balance issues again and they would like to get him back into therapy. That was helping before and Dr. Alen Blew agreed that it may help him  They are wanting referral back to [facility] and with his same therapist he was working with since they already know his issues, Tillie Rung and Elmyra Ricks  Please call

## 2020-11-03 NOTE — Telephone Encounter (Signed)
Called and referral was faxed over. [place]

## 2020-11-03 NOTE — Telephone Encounter (Signed)
Pt was advised Jimmy [name] 

## 2020-11-07 DIAGNOSIS — Z9981 Dependence on supplemental oxygen: Secondary | ICD-10-CM | POA: Diagnosis not present

## 2020-11-07 DIAGNOSIS — Z7951 Long term (current) use of inhaled steroids: Secondary | ICD-10-CM | POA: Diagnosis not present

## 2020-11-07 DIAGNOSIS — E119 Type 2 diabetes mellitus without complications: Secondary | ICD-10-CM | POA: Diagnosis not present

## 2020-11-07 DIAGNOSIS — Z7984 Long term (current) use of oral hypoglycemic drugs: Secondary | ICD-10-CM | POA: Diagnosis not present

## 2020-11-07 DIAGNOSIS — C7801 Secondary malignant neoplasm of right lung: Secondary | ICD-10-CM | POA: Diagnosis not present

## 2020-11-07 DIAGNOSIS — C7802 Secondary malignant neoplasm of left lung: Secondary | ICD-10-CM | POA: Diagnosis not present

## 2020-11-07 DIAGNOSIS — Z794 Long term (current) use of insulin: Secondary | ICD-10-CM | POA: Diagnosis not present

## 2020-11-07 DIAGNOSIS — C679 Malignant neoplasm of bladder, unspecified: Secondary | ICD-10-CM | POA: Diagnosis not present

## 2020-11-07 DIAGNOSIS — Z7901 Long term (current) use of anticoagulants: Secondary | ICD-10-CM | POA: Diagnosis not present

## 2020-11-10 DIAGNOSIS — C7801 Secondary malignant neoplasm of right lung: Secondary | ICD-10-CM | POA: Diagnosis not present

## 2020-11-10 DIAGNOSIS — Z7984 Long term (current) use of oral hypoglycemic drugs: Secondary | ICD-10-CM | POA: Diagnosis not present

## 2020-11-10 DIAGNOSIS — C679 Malignant neoplasm of bladder, unspecified: Secondary | ICD-10-CM | POA: Diagnosis not present

## 2020-11-10 DIAGNOSIS — C7802 Secondary malignant neoplasm of left lung: Secondary | ICD-10-CM | POA: Diagnosis not present

## 2020-11-10 DIAGNOSIS — Z9981 Dependence on supplemental oxygen: Secondary | ICD-10-CM | POA: Diagnosis not present

## 2020-11-10 DIAGNOSIS — Z7951 Long term (current) use of inhaled steroids: Secondary | ICD-10-CM | POA: Diagnosis not present

## 2020-11-10 DIAGNOSIS — Z794 Long term (current) use of insulin: Secondary | ICD-10-CM | POA: Diagnosis not present

## 2020-11-10 DIAGNOSIS — Z7901 Long term (current) use of anticoagulants: Secondary | ICD-10-CM | POA: Diagnosis not present

## 2020-11-10 DIAGNOSIS — E119 Type 2 diabetes mellitus without complications: Secondary | ICD-10-CM | POA: Diagnosis not present

## 2020-11-14 DIAGNOSIS — Z7951 Long term (current) use of inhaled steroids: Secondary | ICD-10-CM | POA: Diagnosis not present

## 2020-11-14 DIAGNOSIS — U071 COVID-19: Secondary | ICD-10-CM | POA: Diagnosis not present

## 2020-11-14 DIAGNOSIS — Z9981 Dependence on supplemental oxygen: Secondary | ICD-10-CM | POA: Diagnosis not present

## 2020-11-14 DIAGNOSIS — C679 Malignant neoplasm of bladder, unspecified: Secondary | ICD-10-CM | POA: Diagnosis not present

## 2020-11-14 DIAGNOSIS — Z7984 Long term (current) use of oral hypoglycemic drugs: Secondary | ICD-10-CM | POA: Diagnosis not present

## 2020-11-14 DIAGNOSIS — C7801 Secondary malignant neoplasm of right lung: Secondary | ICD-10-CM | POA: Diagnosis not present

## 2020-11-14 DIAGNOSIS — Z794 Long term (current) use of insulin: Secondary | ICD-10-CM | POA: Diagnosis not present

## 2020-11-14 DIAGNOSIS — Z7901 Long term (current) use of anticoagulants: Secondary | ICD-10-CM | POA: Diagnosis not present

## 2020-11-14 DIAGNOSIS — C7802 Secondary malignant neoplasm of left lung: Secondary | ICD-10-CM | POA: Diagnosis not present

## 2020-11-14 DIAGNOSIS — J449 Chronic obstructive pulmonary disease, unspecified: Secondary | ICD-10-CM | POA: Diagnosis not present

## 2020-11-14 DIAGNOSIS — E119 Type 2 diabetes mellitus without complications: Secondary | ICD-10-CM | POA: Diagnosis not present

## 2020-11-17 ENCOUNTER — Telehealth: Payer: Self-pay

## 2020-11-17 NOTE — Telephone Encounter (Signed)
Tylenol is the main one he can use

## 2020-11-17 NOTE — Telephone Encounter (Signed)
Pt wife was advised [facility]

## 2020-11-17 NOTE — Telephone Encounter (Signed)
Let him know that he will need to work with a Copy and that those medications can be used.

## 2020-11-17 NOTE — Telephone Encounter (Signed)
Pt wife was advised to stop the celebrex . She would like to know if there is something we can sub for due to the pain . Please advise

## 2020-11-17 NOTE — Telephone Encounter (Signed)
Tylenol does not touch the pain . Please advise [facility]

## 2020-11-18 DIAGNOSIS — Z7984 Long term (current) use of oral hypoglycemic drugs: Secondary | ICD-10-CM | POA: Diagnosis not present

## 2020-11-18 DIAGNOSIS — Z9981 Dependence on supplemental oxygen: Secondary | ICD-10-CM | POA: Diagnosis not present

## 2020-11-18 DIAGNOSIS — Z7951 Long term (current) use of inhaled steroids: Secondary | ICD-10-CM | POA: Diagnosis not present

## 2020-11-18 DIAGNOSIS — C679 Malignant neoplasm of bladder, unspecified: Secondary | ICD-10-CM | POA: Diagnosis not present

## 2020-11-18 DIAGNOSIS — Z794 Long term (current) use of insulin: Secondary | ICD-10-CM | POA: Diagnosis not present

## 2020-11-18 DIAGNOSIS — C7801 Secondary malignant neoplasm of right lung: Secondary | ICD-10-CM | POA: Diagnosis not present

## 2020-11-18 DIAGNOSIS — Z7901 Long term (current) use of anticoagulants: Secondary | ICD-10-CM | POA: Diagnosis not present

## 2020-11-18 DIAGNOSIS — E119 Type 2 diabetes mellitus without complications: Secondary | ICD-10-CM | POA: Diagnosis not present

## 2020-11-18 DIAGNOSIS — C7802 Secondary malignant neoplasm of left lung: Secondary | ICD-10-CM | POA: Diagnosis not present

## 2020-11-21 DIAGNOSIS — C7802 Secondary malignant neoplasm of left lung: Secondary | ICD-10-CM | POA: Diagnosis not present

## 2020-11-21 DIAGNOSIS — C7801 Secondary malignant neoplasm of right lung: Secondary | ICD-10-CM | POA: Diagnosis not present

## 2020-11-21 DIAGNOSIS — C679 Malignant neoplasm of bladder, unspecified: Secondary | ICD-10-CM | POA: Diagnosis not present

## 2020-11-21 DIAGNOSIS — Z7951 Long term (current) use of inhaled steroids: Secondary | ICD-10-CM | POA: Diagnosis not present

## 2020-11-21 DIAGNOSIS — Z794 Long term (current) use of insulin: Secondary | ICD-10-CM | POA: Diagnosis not present

## 2020-11-21 DIAGNOSIS — Z7984 Long term (current) use of oral hypoglycemic drugs: Secondary | ICD-10-CM | POA: Diagnosis not present

## 2020-11-21 DIAGNOSIS — Z9981 Dependence on supplemental oxygen: Secondary | ICD-10-CM | POA: Diagnosis not present

## 2020-11-21 DIAGNOSIS — Z7901 Long term (current) use of anticoagulants: Secondary | ICD-10-CM | POA: Diagnosis not present

## 2020-11-21 DIAGNOSIS — E119 Type 2 diabetes mellitus without complications: Secondary | ICD-10-CM | POA: Diagnosis not present

## 2020-11-23 ENCOUNTER — Other Ambulatory Visit: Payer: Self-pay

## 2020-11-23 ENCOUNTER — Inpatient Hospital Stay: Payer: Medicare PPO

## 2020-11-23 VITALS — BP 120/68 | HR 72 | Temp 98.1°F | Resp 18

## 2020-11-23 DIAGNOSIS — Z5112 Encounter for antineoplastic immunotherapy: Secondary | ICD-10-CM | POA: Diagnosis not present

## 2020-11-23 DIAGNOSIS — Z79899 Other long term (current) drug therapy: Secondary | ICD-10-CM | POA: Diagnosis not present

## 2020-11-23 DIAGNOSIS — C679 Malignant neoplasm of bladder, unspecified: Secondary | ICD-10-CM

## 2020-11-23 DIAGNOSIS — Z9221 Personal history of antineoplastic chemotherapy: Secondary | ICD-10-CM | POA: Diagnosis not present

## 2020-11-23 DIAGNOSIS — C7800 Secondary malignant neoplasm of unspecified lung: Secondary | ICD-10-CM | POA: Diagnosis not present

## 2020-11-23 DIAGNOSIS — D649 Anemia, unspecified: Secondary | ICD-10-CM | POA: Diagnosis not present

## 2020-11-23 LAB — CBC WITH DIFFERENTIAL (CANCER CENTER ONLY)
Abs Immature Granulocytes: 0.01 10*3/uL (ref 0.00–0.07)
Basophils Absolute: 0.1 10*3/uL (ref 0.0–0.1)
Basophils Relative: 1 %
Eosinophils Absolute: 0.2 10*3/uL (ref 0.0–0.5)
Eosinophils Relative: 4 %
HCT: 36 % — ABNORMAL LOW (ref 39.0–52.0)
Hemoglobin: 10.9 g/dL — ABNORMAL LOW (ref 13.0–17.0)
Immature Granulocytes: 0 %
Lymphocytes Relative: 12 %
Lymphs Abs: 0.6 10*3/uL — ABNORMAL LOW (ref 0.7–4.0)
MCH: 27.6 pg (ref 26.0–34.0)
MCHC: 30.3 g/dL (ref 30.0–36.0)
MCV: 91.1 fL (ref 80.0–100.0)
Monocytes Absolute: 0.5 10*3/uL (ref 0.1–1.0)
Monocytes Relative: 9 %
Neutro Abs: 3.9 10*3/uL (ref 1.7–7.7)
Neutrophils Relative %: 74 %
Platelet Count: 158 10*3/uL (ref 150–400)
RBC: 3.95 MIL/uL — ABNORMAL LOW (ref 4.22–5.81)
RDW: 17.4 % — ABNORMAL HIGH (ref 11.5–15.5)
WBC Count: 5.2 10*3/uL (ref 4.0–10.5)
nRBC: 0 % (ref 0.0–0.2)

## 2020-11-23 LAB — CMP (CANCER CENTER ONLY)
ALT: 27 U/L (ref 0–44)
AST: 50 U/L — ABNORMAL HIGH (ref 15–41)
Albumin: 3.5 g/dL (ref 3.5–5.0)
Alkaline Phosphatase: 85 U/L (ref 38–126)
Anion gap: 9 (ref 5–15)
BUN: 25 mg/dL — ABNORMAL HIGH (ref 8–23)
CO2: 20 mmol/L — ABNORMAL LOW (ref 22–32)
Calcium: 9.8 mg/dL (ref 8.9–10.3)
Chloride: 110 mmol/L (ref 98–111)
Creatinine: 1.6 mg/dL — ABNORMAL HIGH (ref 0.61–1.24)
GFR, Estimated: 45 mL/min — ABNORMAL LOW (ref >60)
Glucose, Bld: 188 mg/dL — ABNORMAL HIGH (ref 70–99)
Potassium: 4.4 mmol/L (ref 3.5–5.1)
Sodium: 139 mmol/L (ref 135–145)
Total Bilirubin: 0.3 mg/dL (ref 0.3–1.2)
Total Protein: 8.1 g/dL (ref 6.5–8.1)

## 2020-11-23 LAB — TSH: TSH: 2.726 u[IU]/mL (ref 0.320–4.118)

## 2020-11-23 MED ORDER — SODIUM CHLORIDE 0.9 % IV SOLN
200.0000 mg | Freq: Once | INTRAVENOUS | Status: AC
  Administered 2020-11-23: 200 mg via INTRAVENOUS
  Filled 2020-11-23: qty 8

## 2020-11-23 MED ORDER — SODIUM CHLORIDE 0.9 % IV SOLN
Freq: Once | INTRAVENOUS | Status: AC
  Filled 2020-11-23: qty 250

## 2020-11-23 NOTE — Progress Notes (Signed)
Per Dr Alen Blew, ok to treat with creatinine 1.6

## 2020-11-23 NOTE — Patient Instructions (Signed)
[facility] Discharge Instructions for Patients Receiving Chemotherapy  Today you received the following chemotherapy agents: pembrolizumab.  To help prevent nausea and vomiting after your treatment, we encourage you to take your nausea medication as directed.   If you develop nausea and vomiting that is not controlled by your nausea medication, call the clinic.   BELOW ARE SYMPTOMS THAT SHOULD BE REPORTED IMMEDIATELY:  *FEVER GREATER THAN 100.5 F  *CHILLS WITH OR WITHOUT FEVER  NAUSEA AND VOMITING THAT IS NOT CONTROLLED WITH YOUR NAUSEA MEDICATION  *UNUSUAL SHORTNESS OF BREATH  *UNUSUAL BRUISING OR BLEEDING  TENDERNESS IN MOUTH AND THROAT WITH OR WITHOUT PRESENCE OF ULCERS  *URINARY PROBLEMS  *BOWEL PROBLEMS  UNUSUAL RASH Items with * indicate a potential emergency and should be followed up as soon as possible.  Feel free to call the clinic should you have any questions or concerns. The clinic phone number is [phone].  Please show the CHEMO ALERT CARD at check-in to the Emergency Department and triage nurse.   

## 2020-11-30 ENCOUNTER — Telehealth: Payer: Self-pay

## 2020-11-30 NOTE — Telephone Encounter (Signed)
Called to find out a start date for Jimmy [name] home health to update his referral. [facility]

## 2020-12-01 DIAGNOSIS — Z7951 Long term (current) use of inhaled steroids: Secondary | ICD-10-CM | POA: Diagnosis not present

## 2020-12-01 DIAGNOSIS — C679 Malignant neoplasm of bladder, unspecified: Secondary | ICD-10-CM | POA: Diagnosis not present

## 2020-12-01 DIAGNOSIS — C7801 Secondary malignant neoplasm of right lung: Secondary | ICD-10-CM | POA: Diagnosis not present

## 2020-12-01 DIAGNOSIS — Z7901 Long term (current) use of anticoagulants: Secondary | ICD-10-CM | POA: Diagnosis not present

## 2020-12-01 DIAGNOSIS — C7802 Secondary malignant neoplasm of left lung: Secondary | ICD-10-CM | POA: Diagnosis not present

## 2020-12-01 DIAGNOSIS — Z7984 Long term (current) use of oral hypoglycemic drugs: Secondary | ICD-10-CM | POA: Diagnosis not present

## 2020-12-01 DIAGNOSIS — E119 Type 2 diabetes mellitus without complications: Secondary | ICD-10-CM | POA: Diagnosis not present

## 2020-12-01 DIAGNOSIS — Z794 Long term (current) use of insulin: Secondary | ICD-10-CM | POA: Diagnosis not present

## 2020-12-01 DIAGNOSIS — Z9981 Dependence on supplemental oxygen: Secondary | ICD-10-CM | POA: Diagnosis not present

## 2020-12-02 ENCOUNTER — Telehealth: Payer: Self-pay

## 2020-12-02 MED ORDER — TRAMADOL HCL 50 MG PO TABS
50.0000 mg | ORAL_TABLET | Freq: Three times a day (TID) | ORAL | 0 refills | Status: AC | PRN
Start: 2020-12-02 — End: 2020-12-07

## 2020-12-02 NOTE — Telephone Encounter (Signed)
Joelene Millin a PT with [facility] called stating that Mr. Njoku is in a lot of pain said he was crying the pain was so bad. She said the tylenol arthritis is not working and was wondering if he could get something different for the pain like Tramadol. She said he had some left over from a while ago when he was in the hospital, he took that and that seemed to help some with the pain. She said his rt. Wrist, rt. Hip and lt. Arm is hurting really bad.

## 2020-12-02 NOTE — Telephone Encounter (Signed)
I called some pain medication in.

## 2020-12-02 NOTE — Telephone Encounter (Signed)
Pt wife was advised and called . [place]

## 2020-12-07 ENCOUNTER — Telehealth: Payer: Self-pay | Admitting: Family Medicine

## 2020-12-07 MED ORDER — METFORMIN HCL 1000 MG PO TABS: 1000.0000 mg | ORAL_TABLET | Freq: Every day | ORAL | 1 refills | Status: AC

## 2020-12-07 NOTE — Telephone Encounter (Signed)
Pt needs refill on metformin 1000  90 day supply   [facility]

## 2020-12-08 ENCOUNTER — Other Ambulatory Visit: Payer: Self-pay | Admitting: Oncology

## 2020-12-08 DIAGNOSIS — L299 Pruritus, unspecified: Secondary | ICD-10-CM

## 2020-12-09 DIAGNOSIS — C7801 Secondary malignant neoplasm of right lung: Secondary | ICD-10-CM | POA: Diagnosis not present

## 2020-12-09 DIAGNOSIS — Z7984 Long term (current) use of oral hypoglycemic drugs: Secondary | ICD-10-CM | POA: Diagnosis not present

## 2020-12-09 DIAGNOSIS — Z7951 Long term (current) use of inhaled steroids: Secondary | ICD-10-CM | POA: Diagnosis not present

## 2020-12-09 DIAGNOSIS — C679 Malignant neoplasm of bladder, unspecified: Secondary | ICD-10-CM | POA: Diagnosis not present

## 2020-12-09 DIAGNOSIS — Z7901 Long term (current) use of anticoagulants: Secondary | ICD-10-CM | POA: Diagnosis not present

## 2020-12-09 DIAGNOSIS — C7802 Secondary malignant neoplasm of left lung: Secondary | ICD-10-CM | POA: Diagnosis not present

## 2020-12-09 DIAGNOSIS — Z794 Long term (current) use of insulin: Secondary | ICD-10-CM | POA: Diagnosis not present

## 2020-12-09 DIAGNOSIS — E119 Type 2 diabetes mellitus without complications: Secondary | ICD-10-CM | POA: Diagnosis not present

## 2020-12-09 DIAGNOSIS — Z9981 Dependence on supplemental oxygen: Secondary | ICD-10-CM | POA: Diagnosis not present

## 2020-12-14 ENCOUNTER — Inpatient Hospital Stay: Payer: Medicare PPO | Admitting: Oncology

## 2020-12-14 ENCOUNTER — Inpatient Hospital Stay: Payer: Medicare PPO

## 2020-12-14 ENCOUNTER — Other Ambulatory Visit: Payer: Self-pay

## 2020-12-14 ENCOUNTER — Inpatient Hospital Stay: Payer: Medicare PPO | Attending: Oncology

## 2020-12-14 VITALS — BP 130/63 | HR 80 | Temp 99.2°F | Resp 18 | Ht 67.0 in | Wt 253.1 lb

## 2020-12-14 DIAGNOSIS — U071 COVID-19: Secondary | ICD-10-CM | POA: Diagnosis not present

## 2020-12-14 DIAGNOSIS — Z5112 Encounter for antineoplastic immunotherapy: Secondary | ICD-10-CM | POA: Diagnosis not present

## 2020-12-14 DIAGNOSIS — Z79899 Other long term (current) drug therapy: Secondary | ICD-10-CM | POA: Insufficient documentation

## 2020-12-14 DIAGNOSIS — M791 Myalgia, unspecified site: Secondary | ICD-10-CM | POA: Insufficient documentation

## 2020-12-14 DIAGNOSIS — C679 Malignant neoplasm of bladder, unspecified: Secondary | ICD-10-CM | POA: Diagnosis not present

## 2020-12-14 DIAGNOSIS — D630 Anemia in neoplastic disease: Secondary | ICD-10-CM | POA: Diagnosis not present

## 2020-12-14 DIAGNOSIS — J449 Chronic obstructive pulmonary disease, unspecified: Secondary | ICD-10-CM | POA: Diagnosis not present

## 2020-12-14 LAB — TSH: TSH: 2.096 u[IU]/mL (ref 0.320–4.118)

## 2020-12-14 LAB — CBC WITH DIFFERENTIAL (CANCER CENTER ONLY)
Abs Immature Granulocytes: 0.01 10*3/uL (ref 0.00–0.07)
Basophils Absolute: 0.1 10*3/uL (ref 0.0–0.1)
Basophils Relative: 1 %
Eosinophils Absolute: 0.2 10*3/uL (ref 0.0–0.5)
Eosinophils Relative: 4 %
HCT: 34.9 % — ABNORMAL LOW (ref 39.0–52.0)
Hemoglobin: 10.6 g/dL — ABNORMAL LOW (ref 13.0–17.0)
Immature Granulocytes: 0 %
Lymphocytes Relative: 11 %
Lymphs Abs: 0.7 10*3/uL (ref 0.7–4.0)
MCH: 28.3 pg (ref 26.0–34.0)
MCHC: 30.4 g/dL (ref 30.0–36.0)
MCV: 93.1 fL (ref 80.0–100.0)
Monocytes Absolute: 0.6 10*3/uL (ref 0.1–1.0)
Monocytes Relative: 10 %
Neutro Abs: 4.5 10*3/uL (ref 1.7–7.7)
Neutrophils Relative %: 74 %
Platelet Count: 173 10*3/uL (ref 150–400)
RBC: 3.75 MIL/uL — ABNORMAL LOW (ref 4.22–5.81)
RDW: 19.4 % — ABNORMAL HIGH (ref 11.5–15.5)
WBC Count: 6 10*3/uL (ref 4.0–10.5)
nRBC: 0 % (ref 0.0–0.2)

## 2020-12-14 LAB — CMP (CANCER CENTER ONLY)
ALT: 33 U/L (ref 0–44)
AST: 55 U/L — ABNORMAL HIGH (ref 15–41)
Albumin: 3.4 g/dL — ABNORMAL LOW (ref 3.5–5.0)
Alkaline Phosphatase: 89 U/L (ref 38–126)
Anion gap: 7 (ref 5–15)
BUN: 30 mg/dL — ABNORMAL HIGH (ref 8–23)
CO2: 23 mmol/L (ref 22–32)
Calcium: 10.1 mg/dL (ref 8.9–10.3)
Chloride: 110 mmol/L (ref 98–111)
Creatinine: 1.72 mg/dL — ABNORMAL HIGH (ref 0.61–1.24)
GFR, Estimated: 41 mL/min — ABNORMAL LOW (ref >60)
Glucose, Bld: 202 mg/dL — ABNORMAL HIGH (ref 70–99)
Potassium: 4.8 mmol/L (ref 3.5–5.1)
Sodium: 140 mmol/L (ref 135–145)
Total Bilirubin: 0.5 mg/dL (ref 0.3–1.2)
Total Protein: 8.9 g/dL — ABNORMAL HIGH (ref 6.5–8.1)

## 2020-12-14 MED ORDER — SODIUM CHLORIDE 0.9 % IV SOLN
Freq: Once | INTRAVENOUS | Status: AC
  Filled 2020-12-14: qty 250

## 2020-12-14 MED ORDER — SODIUM CHLORIDE 0.9 % IV SOLN
200.0000 mg | Freq: Once | INTRAVENOUS | Status: AC
  Administered 2020-12-14: 200 mg via INTRAVENOUS
  Filled 2020-12-14: qty 8

## 2020-12-14 MED ORDER — TRAMADOL HCL 50 MG PO TABS: 50.0000 mg | ORAL_TABLET | Freq: Four times a day (QID) | ORAL | 0 refills | Status: DC | PRN

## 2020-12-14 NOTE — Patient Instructions (Signed)
[facility] Discharge Instructions for Patients Receiving Chemotherapy  Today you received the following chemotherapy agents: pembrolizumab.  To help prevent nausea and vomiting after your treatment, we encourage you to take your nausea medication as directed.   If you develop nausea and vomiting that is not controlled by your nausea medication, call the clinic.   BELOW ARE SYMPTOMS THAT SHOULD BE REPORTED IMMEDIATELY:  *FEVER GREATER THAN 100.5 F  *CHILLS WITH OR WITHOUT FEVER  NAUSEA AND VOMITING THAT IS NOT CONTROLLED WITH YOUR NAUSEA MEDICATION  *UNUSUAL SHORTNESS OF BREATH  *UNUSUAL BRUISING OR BLEEDING  TENDERNESS IN MOUTH AND THROAT WITH OR WITHOUT PRESENCE OF ULCERS  *URINARY PROBLEMS  *BOWEL PROBLEMS  UNUSUAL RASH Items with * indicate a potential emergency and should be followed up as soon as possible.  Feel free to call the clinic should you have any questions or concerns. The clinic phone number is [phone].  Please show the CHEMO ALERT CARD at check-in to the Emergency Department and triage nurse.   

## 2020-12-14 NOTE — Progress Notes (Signed)
pt Scr. Is 1.72, ok to treat per Zola Button, MD.

## 2020-12-14 NOTE — Progress Notes (Signed)
Hematology and Oncology Follow Up Visit  LORCAN SHELP [id number] Oct 19, 1946 74 y.o. 12/14/2020 12:17 PM [name], MDLalonde, [name], MD   Principle Diagnosis: 74 year old man with stage IV high-grade urothelial carcinoma of the bladder with pulmonary involvement diagnosed in July 2021.  He was initially found to have localized disease in June 2020.  Prior Therapy: TURBT with right ureteral stent placement as well as left ureteral stent placement at that time.  The final pathology showed infiltrating high-grade papillary adenocarcinoma invading into the detrusor muscle with lymphovascular invasion identified.  This was completed on 06/22/2019.    Neoadjuvant chemotherapy started on August 07, 2019 with cisplatin and gemcitabine started on August 07, 2019.  He completed 4 cycles of chemotherapy on October 16, 2019.  He is status post radical cystectomy and lymph node dissection on December 10, 2019.  The final pathology showed 1.5 cm residual tumor with invasion into the perivesicular soft tissue.  Final pathological stage was T3a and 0 with 0 out of 10 lymph nodes involved.  Current therapy: Pembrolizumab 200 mg every 3 weeks started on August 10, 2020.  He is here for cycle 6 of therapy.  Interim History: Mr. Serpe is here for return evaluation.  Since the last visit, he reports no major changes in his health.  He has reported some increased arthralgias and myalgias predominantly in his left arm but manageable with tramadol.  He denies any nausea, vomiting or abdominal pain.  He denies any any shortness of breath or difficulty breathing.                 Medications: Reviewed without changes. Current Outpatient Medications  Medication Sig Dispense Refill  . Accu-Chek Softclix Lancets lancets 1 each by Other route in the morning, at noon, and at bedtime. Use as instructed 300 each 2  . acidophilus (RISAQUAD) CAPS capsule Take 1 capsule by mouth daily.    . celecoxib  (CELEBREX) 200 MG capsule Take 200 mg by mouth daily.    . diclofenac Sodium (VOLTAREN) 1 % GEL Apply topically as needed.    [name] ELIQUIS 5 MG TABS tablet TAKE 1 TABLET BY MOUTH TWICE A DAY 180 tablet 1  . ferrous sulfate 325 (65 FE) MG tablet Take 325 mg by mouth daily with breakfast.    . glucose blood test strip 1 each by Other route in the morning, at noon, and at bedtime. Use as instructed 300 each 2  . hydrOXYzine (ATARAX/VISTARIL) 10 MG tablet TAKE 1 TABLET BY MOUTH THREE TIMES A DAY AS NEEDED 30 tablet 2  . insulin detemir (LEVEMIR) 100 UNIT/ML injection Inject 16 Units into the skin daily.    . Insulin Pen Needle 32G X 4 MM MISC Use for injections twice daily 200 each 1  . Ipratropium-Albuterol (COMBIVENT RESPIMAT) 20-100 MCG/ACT AERS respimat Inhale 1 puff into the lungs in the morning, at noon, in the evening, and at bedtime. Rinse mouth after use 4 g 5  . LEVEMIR FLEXTOUCH 100 UNIT/ML FlexPen INJECT 16 UNITS TWICE A DAY 15 mL 1  . metFORMIN (GLUCOPHAGE) 1000 MG tablet Take 1 tablet (1,000 mg total) by mouth daily. 90 tablet 1  . metoprolol tartrate (LOPRESSOR) 25 MG tablet TAKE 1/2 TABLET BY MOUTH TWICE A DAY 90 tablet 1  . Multiple Vitamin (MULTIVITAMIN WITH MINERALS) TABS tablet Take 1 tablet by mouth daily. 30 tablet 0  . pantoprazole (PROTONIX) 40 MG tablet TAKE 1 TABLET BY MOUTH TWICE A DAY 180 tablet 1  No current facility-administered medications for this visit.     Allergies:  Allergies  Allergen Reactions  . Ace Inhibitors Cough  . Cephalexin Hives, Diarrhea and Nausea Only    Tolerates Ancef, Rocephin, Cefoxitin  . Crestor [Rosuvastatin Calcium]   . Lipitor [Atorvastatin Calcium] Other (See Comments)    MUSCLE PAINS   . Plavix [Clopidogrel Bisulfate] Hives  . Vancomycin Nausea And Vomiting    Po route caused n/v.        Physical Exam:     Blood pressure 130/63, pulse 80, temperature 99.2 F (37.3 C), temperature source Tympanic, resp. rate 18, height 5'  7" (1.702 m), weight 253 lb 1.6 oz (114.8 kg), SpO2 95 %.       ECOG: 2   General appearance: Alert, awake without any distress. Head: Atraumatic without abnormalities Oropharynx: Without any thrush or ulcers. Eyes: No scleral icterus. Lymph nodes: No lymphadenopathy noted in the cervical, supraclavicular, or axillary nodes Heart:regular rate and rhythm, without any murmurs or gallops.   Lung: Clear to auscultation without any rhonchi, wheezes or dullness to percussion. Abdomin: Soft, nontender without any shifting dullness or ascites. Musculoskeletal: No clubbing or cyanosis. Neurological: No motor or sensory deficits. Skin: No rashes or lesions.             Lab Results: Lab Results  Component Value Date   WBC 5.2 11/23/2020   HGB 10.9 (L) 11/23/2020   HCT 36.0 (L) 11/23/2020   MCV 91.1 11/23/2020   PLT 158 11/23/2020     Chemistry      Component Value Date/Time   NA 139 11/23/2020 1016   NA 140 08/11/2020 1451   K 4.4 11/23/2020 1016   CL 110 11/23/2020 1016   CO2 20 (L) 11/23/2020 1016   BUN 25 (H) 11/23/2020 1016   BUN 20 08/11/2020 1451   CREATININE 1.60 (H) 11/23/2020 1016   CREATININE 0.89 11/19/2016 0920      Component Value Date/Time   CALCIUM 9.8 11/23/2020 1016   ALKPHOS 85 11/23/2020 1016   AST 50 (H) 11/23/2020 1016   ALT 27 11/23/2020 1016   BILITOT 0.3 11/23/2020 1016       Impression and Plan:   74 year old man with:  1.  Bladder cancer diagnosed in June 2020.  He developed stage IV high-grade urothelial carcinoma with pulmonary involvement  in July 2021.    The natural course of this disease was updated today and treatment options were discussed.  He is currently on Pembrolizumab which she has tolerated without any major complaints.  Risks and benefits of continuing this treatment versus switching to different salvage therapy utilizing Padcev was reviewed.  Alternative options including FGFR inhibitors if he harbors the  appropriate mutation versus palliative care and hospice.  He is agreeable to continue at this time.   2. IV access:No issues reported with his peripheral veins.  No Port-A-Cath is needed at this time.   3.  Anemia: Related to malignancy and hematuria.  His hemoglobin continues to improve.  4.  Immune mediated complications.  I continue to educate him about potential complications including pneumonitis, colitis and thyroid disease.  5.  Muscle pain: Could be a musculoskeletal in nature versus autoimmune in etiology.  We will provide prescription for Ultram to use as needed.  6. Follow-up:  Will return in 3 weeks for a follow-up visit.  30  minutes were spent on this encounter.  Time was dedicated to reviewing disease status, discussing treatment options and outlining future  plan of care.      Zola Button, MD 12/15/202112:17 PM

## 2020-12-15 ENCOUNTER — Telehealth: Payer: Self-pay | Admitting: Cardiology

## 2020-12-15 NOTE — Telephone Encounter (Signed)
    Pt's wife said, pt have stage 4 cancer and in a wheelchair he can't come in to any appt as of this time.

## 2020-12-16 DIAGNOSIS — C679 Malignant neoplasm of bladder, unspecified: Secondary | ICD-10-CM | POA: Diagnosis not present

## 2020-12-16 DIAGNOSIS — Z794 Long term (current) use of insulin: Secondary | ICD-10-CM | POA: Diagnosis not present

## 2020-12-16 DIAGNOSIS — Z7951 Long term (current) use of inhaled steroids: Secondary | ICD-10-CM | POA: Diagnosis not present

## 2020-12-16 DIAGNOSIS — Z9981 Dependence on supplemental oxygen: Secondary | ICD-10-CM | POA: Diagnosis not present

## 2020-12-16 DIAGNOSIS — E119 Type 2 diabetes mellitus without complications: Secondary | ICD-10-CM | POA: Diagnosis not present

## 2020-12-16 DIAGNOSIS — Z7901 Long term (current) use of anticoagulants: Secondary | ICD-10-CM | POA: Diagnosis not present

## 2020-12-16 DIAGNOSIS — C7801 Secondary malignant neoplasm of right lung: Secondary | ICD-10-CM | POA: Diagnosis not present

## 2020-12-16 DIAGNOSIS — Z7984 Long term (current) use of oral hypoglycemic drugs: Secondary | ICD-10-CM | POA: Diagnosis not present

## 2020-12-16 DIAGNOSIS — C7802 Secondary malignant neoplasm of left lung: Secondary | ICD-10-CM | POA: Diagnosis not present

## 2020-12-27 ENCOUNTER — Other Ambulatory Visit: Payer: Self-pay | Admitting: Oncology

## 2020-12-28 DIAGNOSIS — Z8551 Personal history of malignant neoplasm of bladder: Secondary | ICD-10-CM | POA: Diagnosis not present

## 2020-12-28 DIAGNOSIS — Z936 Other artificial openings of urinary tract status: Secondary | ICD-10-CM | POA: Diagnosis not present

## 2020-12-30 DIAGNOSIS — C7802 Secondary malignant neoplasm of left lung: Secondary | ICD-10-CM | POA: Diagnosis not present

## 2020-12-30 DIAGNOSIS — Z9981 Dependence on supplemental oxygen: Secondary | ICD-10-CM | POA: Diagnosis not present

## 2020-12-30 DIAGNOSIS — E119 Type 2 diabetes mellitus without complications: Secondary | ICD-10-CM | POA: Diagnosis not present

## 2020-12-30 DIAGNOSIS — C679 Malignant neoplasm of bladder, unspecified: Secondary | ICD-10-CM | POA: Diagnosis not present

## 2020-12-30 DIAGNOSIS — Z7901 Long term (current) use of anticoagulants: Secondary | ICD-10-CM | POA: Diagnosis not present

## 2020-12-30 DIAGNOSIS — Z7951 Long term (current) use of inhaled steroids: Secondary | ICD-10-CM | POA: Diagnosis not present

## 2020-12-30 DIAGNOSIS — Z794 Long term (current) use of insulin: Secondary | ICD-10-CM | POA: Diagnosis not present

## 2020-12-30 DIAGNOSIS — Z7984 Long term (current) use of oral hypoglycemic drugs: Secondary | ICD-10-CM | POA: Diagnosis not present

## 2020-12-30 DIAGNOSIS — C7801 Secondary malignant neoplasm of right lung: Secondary | ICD-10-CM | POA: Diagnosis not present

## 2021-01-02 ENCOUNTER — Telehealth: Payer: Self-pay | Admitting: Oncology

## 2021-01-02 NOTE — Telephone Encounter (Signed)
Called patient regarding upcoming appointments, patient is notified. 

## 2021-01-04 ENCOUNTER — Inpatient Hospital Stay: Payer: Medicare PPO

## 2021-01-04 ENCOUNTER — Inpatient Hospital Stay: Payer: Medicare PPO | Attending: Oncology

## 2021-01-04 ENCOUNTER — Ambulatory Visit (INDEPENDENT_AMBULATORY_CARE_PROVIDER_SITE_OTHER): Payer: Medicare PPO

## 2021-01-04 ENCOUNTER — Other Ambulatory Visit: Payer: Self-pay

## 2021-01-04 ENCOUNTER — Inpatient Hospital Stay (HOSPITAL_BASED_OUTPATIENT_CLINIC_OR_DEPARTMENT_OTHER): Payer: Medicare PPO | Admitting: Oncology

## 2021-01-04 VITALS — BP 125/63 | HR 81 | Temp 98.2°F | Resp 20 | Ht 67.0 in | Wt 250.7 lb

## 2021-01-04 DIAGNOSIS — J439 Emphysema, unspecified: Secondary | ICD-10-CM | POA: Diagnosis not present

## 2021-01-04 DIAGNOSIS — I2510 Atherosclerotic heart disease of native coronary artery without angina pectoris: Secondary | ICD-10-CM | POA: Insufficient documentation

## 2021-01-04 DIAGNOSIS — Z791 Long term (current) use of non-steroidal anti-inflammatories (NSAID): Secondary | ICD-10-CM | POA: Insufficient documentation

## 2021-01-04 DIAGNOSIS — C679 Malignant neoplasm of bladder, unspecified: Secondary | ICD-10-CM | POA: Diagnosis not present

## 2021-01-04 DIAGNOSIS — Z7901 Long term (current) use of anticoagulants: Secondary | ICD-10-CM | POA: Insufficient documentation

## 2021-01-04 DIAGNOSIS — D649 Anemia, unspecified: Secondary | ICD-10-CM | POA: Insufficient documentation

## 2021-01-04 DIAGNOSIS — Z23 Encounter for immunization: Secondary | ICD-10-CM

## 2021-01-04 DIAGNOSIS — L309 Dermatitis, unspecified: Secondary | ICD-10-CM | POA: Diagnosis not present

## 2021-01-04 DIAGNOSIS — Z5112 Encounter for antineoplastic immunotherapy: Secondary | ICD-10-CM | POA: Diagnosis not present

## 2021-01-04 DIAGNOSIS — Z79899 Other long term (current) drug therapy: Secondary | ICD-10-CM | POA: Insufficient documentation

## 2021-01-04 DIAGNOSIS — J8410 Pulmonary fibrosis, unspecified: Secondary | ICD-10-CM | POA: Insufficient documentation

## 2021-01-04 LAB — CBC WITH DIFFERENTIAL (CANCER CENTER ONLY)
Abs Immature Granulocytes: 0.02 10*3/uL (ref 0.00–0.07)
Basophils Absolute: 0.1 10*3/uL (ref 0.0–0.1)
Basophils Relative: 1 %
Eosinophils Absolute: 0.3 10*3/uL (ref 0.0–0.5)
Eosinophils Relative: 6 %
HCT: 34.9 % — ABNORMAL LOW (ref 39.0–52.0)
Hemoglobin: 10.9 g/dL — ABNORMAL LOW (ref 13.0–17.0)
Immature Granulocytes: 0 %
Lymphocytes Relative: 11 %
Lymphs Abs: 0.6 10*3/uL — ABNORMAL LOW (ref 0.7–4.0)
MCH: 28.8 pg (ref 26.0–34.0)
MCHC: 31.2 g/dL (ref 30.0–36.0)
MCV: 92.1 fL (ref 80.0–100.0)
Monocytes Absolute: 0.5 10*3/uL (ref 0.1–1.0)
Monocytes Relative: 8 %
Neutro Abs: 4.2 10*3/uL (ref 1.7–7.7)
Neutrophils Relative %: 74 %
Platelet Count: 173 10*3/uL (ref 150–400)
RBC: 3.79 MIL/uL — ABNORMAL LOW (ref 4.22–5.81)
RDW: 20.6 % — ABNORMAL HIGH (ref 11.5–15.5)
WBC Count: 5.6 10*3/uL (ref 4.0–10.5)
nRBC: 0 % (ref 0.0–0.2)

## 2021-01-04 LAB — CMP (CANCER CENTER ONLY)
ALT: 29 U/L (ref 0–44)
AST: 52 U/L — ABNORMAL HIGH (ref 15–41)
Albumin: 3.4 g/dL — ABNORMAL LOW (ref 3.5–5.0)
Alkaline Phosphatase: 86 U/L (ref 38–126)
Anion gap: 11 (ref 5–15)
BUN: 24 mg/dL — ABNORMAL HIGH (ref 8–23)
CO2: 19 mmol/L — ABNORMAL LOW (ref 22–32)
Calcium: 9.5 mg/dL (ref 8.9–10.3)
Chloride: 110 mmol/L (ref 98–111)
Creatinine: 1.61 mg/dL — ABNORMAL HIGH (ref 0.61–1.24)
GFR, Estimated: 45 mL/min — ABNORMAL LOW (ref >60)
Glucose, Bld: 140 mg/dL — ABNORMAL HIGH (ref 70–99)
Potassium: 4.4 mmol/L (ref 3.5–5.1)
Sodium: 140 mmol/L (ref 135–145)
Total Bilirubin: 0.4 mg/dL (ref 0.3–1.2)
Total Protein: 7.8 g/dL (ref 6.5–8.1)

## 2021-01-04 LAB — TSH: TSH: 1.926 u[IU]/mL (ref 0.320–4.118)

## 2021-01-04 MED ORDER — SODIUM CHLORIDE 0.9 % IV SOLN
200.0000 mg | Freq: Once | INTRAVENOUS | Status: AC
  Administered 2021-01-04: 200 mg via INTRAVENOUS
  Filled 2021-01-04: qty 8

## 2021-01-04 MED ORDER — SODIUM CHLORIDE 0.9 % IV SOLN
Freq: Once | INTRAVENOUS | Status: AC
  Filled 2021-01-04: qty 250

## 2021-01-04 NOTE — Progress Notes (Signed)
OK to treat w/ creatinine 1.61 per Dr. Clelia Croft.

## 2021-01-04 NOTE — Patient Instructions (Signed)
[facility] Discharge Instructions for Patients Receiving Chemotherapy  Today you received the following chemotherapy agents: pembrolizumab.  To help prevent nausea and vomiting after your treatment, we encourage you to take your nausea medication as directed.   If you develop nausea and vomiting that is not controlled by your nausea medication, call the clinic.   BELOW ARE SYMPTOMS THAT SHOULD BE REPORTED IMMEDIATELY:  *FEVER GREATER THAN 100.5 F  *CHILLS WITH OR WITHOUT FEVER  NAUSEA AND VOMITING THAT IS NOT CONTROLLED WITH YOUR NAUSEA MEDICATION  *UNUSUAL SHORTNESS OF BREATH  *UNUSUAL BRUISING OR BLEEDING  TENDERNESS IN MOUTH AND THROAT WITH OR WITHOUT PRESENCE OF ULCERS  *URINARY PROBLEMS  *BOWEL PROBLEMS  UNUSUAL RASH Items with * indicate a potential emergency and should be followed up as soon as possible.  Feel free to call the clinic should you have any questions or concerns. The clinic phone number is [phone].  Please show the CHEMO ALERT CARD at check-in to the Emergency Department and triage nurse.   

## 2021-01-04 NOTE — Progress Notes (Signed)
Hematology and Oncology Follow Up Visit  Jimmy [name] [id number] Feb 21, 1946 75 y.o. 01/04/2021 11:52 AM Jimmy [name], MDLalonde, [name], MD   Principle Diagnosis: 75 year old man with bladder cancer diagnosed in June 2020.  He developed stage IV high-grade urothelial carcinoma with pulmonary involvement in July 2021.   Prior Therapy: TURBT with right ureteral stent placement as well as left ureteral stent placement at that time.  The final pathology showed infiltrating high-grade papillary adenocarcinoma invading into the detrusor muscle with lymphovascular invasion identified.  This was completed on 06/22/2019.    Neoadjuvant chemotherapy started on August 07, 2019 with cisplatin and gemcitabine started on August 07, 2019.  He completed 4 cycles of chemotherapy on October 16, 2019.  He is status post radical cystectomy and lymph node dissection on December 10, 2019.  The final pathology showed 1.5 cm residual tumor with invasion into the perivesicular soft tissue.  Final pathological stage was T3a and 0 with 0 out of 10 lymph nodes involved.  Current therapy: Pembrolizumab 200 mg every 3 weeks started on August 10, 2020.  He is here for cycle 7 of therapy.  Interim History: Jimmy [name] presents today for repeat evaluation.  Since the last visit, he reports no major changes in his health.  He continues to improve slowly including participating in physical therapy and maintaining quality of life.  He has not reported any recent hospitalization or illnesses.  He has not reported any bone pain or pathological fractures.  He did report some diffuse erythema on his chest and back that is not pruritic.                 Medications: Unchanged on review. Current Outpatient Medications  Medication Sig Dispense Refill  . Accu-Chek Softclix Lancets lancets 1 each by Other route in the morning, at noon, and at bedtime. Use as instructed 300 each 2  . acidophilus (RISAQUAD) CAPS capsule  Take 1 capsule by mouth daily.    . celecoxib (CELEBREX) 200 MG capsule Take 200 mg by mouth daily.    . diclofenac Sodium (VOLTAREN) 1 % GEL Apply topically as needed.    [name] ELIQUIS 5 MG TABS tablet TAKE 1 TABLET BY MOUTH TWICE A DAY 180 tablet 1  . ferrous sulfate 325 (65 FE) MG tablet Take 325 mg by mouth daily with breakfast.    . glucose blood test strip 1 each by Other route in the morning, at noon, and at bedtime. Use as instructed 300 each 2  . hydrOXYzine (ATARAX/VISTARIL) 10 MG tablet TAKE 1 TABLET BY MOUTH THREE TIMES A DAY AS NEEDED 30 tablet 2  . insulin detemir (LEVEMIR) 100 UNIT/ML injection Inject 16 Units into the skin daily.    . Insulin Pen Needle 32G X 4 MM MISC Use for injections twice daily 200 each 1  . Ipratropium-Albuterol (COMBIVENT RESPIMAT) 20-100 MCG/ACT AERS respimat Inhale 1 puff into the lungs in the morning, at noon, in the evening, and at bedtime. Rinse mouth after use 4 g 5  . LEVEMIR FLEXTOUCH 100 UNIT/ML FlexPen INJECT 16 UNITS TWICE A DAY 15 mL 1  . metFORMIN (GLUCOPHAGE) 1000 MG tablet Take 1 tablet (1,000 mg total) by mouth daily. 90 tablet 1  . metoprolol tartrate (LOPRESSOR) 25 MG tablet TAKE 1/2 TABLET BY MOUTH TWICE A DAY 90 tablet 1  . Multiple Vitamin (MULTIVITAMIN WITH MINERALS) TABS tablet Take 1 tablet by mouth daily. 30 tablet 0  . pantoprazole (PROTONIX) 40 MG tablet TAKE 1  TABLET BY MOUTH TWICE A DAY 180 tablet 1  . traMADol (ULTRAM) 50 MG tablet TAKE 1 TABLET BY MOUTH EVERY 6 HOURS AS NEEDED. 60 tablet 0   No current facility-administered medications for this visit.     Allergies:  Allergies  Allergen Reactions  . Ace Inhibitors Cough  . Cephalexin Hives, Diarrhea and Nausea Only    Tolerates Ancef, Rocephin, Cefoxitin  . Crestor [Rosuvastatin Calcium]   . Lipitor [Atorvastatin Calcium] Other (See Comments)    MUSCLE PAINS   . Plavix [Clopidogrel Bisulfate] Hives  . Vancomycin Nausea And Vomiting    Po route caused n/v.         Physical Exam:     Blood pressure 125/63, pulse 81, temperature 98.2 F (36.8 C), temperature source Tympanic, resp. rate 20, height 5\' 7"  (1.702 m), weight 250 lb 11.2 oz (113.7 kg), SpO2 91 %.       ECOG: 2    General appearance: Comfortable appearing without any discomfort Head: Normocephalic without any trauma Oropharynx: Mucous membranes are moist and pink without any thrush or ulcers. Eyes: Pupils are equal and round reactive to light. Lymph nodes: No cervical, supraclavicular, inguinal or axillary lymphadenopathy.   Heart:regular rate and rhythm.  S1 and S2 without leg edema. [name]: Clear without any rhonchi or wheezes.  No dullness to percussion. Abdomin: Soft, nontender, nondistended with good bowel sounds.  No hepatosplenomegaly. Musculoskeletal: No joint deformity or effusion.  Full range of motion noted. Neurological: No deficits noted on motor, sensory and deep tendon reflex exam. Skin: No petechial rash or dryness.  Appeared moist.              Lab Results: Lab Results  Component Value Date   WBC 5.6 01/04/2021   HGB 10.9 (L) 01/04/2021   HCT 34.9 (L) 01/04/2021   MCV 92.1 01/04/2021   PLT 173 01/04/2021     Chemistry      Component Value Date/Time   NA 140 12/14/2020 1223   NA 140 08/11/2020 1451   K 4.8 12/14/2020 1223   CL 110 12/14/2020 1223   CO2 23 12/14/2020 1223   BUN 30 (H) 12/14/2020 1223   BUN 20 08/11/2020 1451   CREATININE 1.72 (H) 12/14/2020 1223   CREATININE 0.89 11/19/2016 0920      Component Value Date/Time   CALCIUM 10.1 12/14/2020 1223   ALKPHOS 89 12/14/2020 1223   AST 55 (H) 12/14/2020 1223   ALT 33 12/14/2020 1223   BILITOT 0.5 12/14/2020 1223       Impression and Plan:   75 year old man with:  1.  Stage IV bladder cancer with pulmonary involvement diagnosed in July 2021.  He was found to have high-grade urothelial carcinoma at that time.  He is currently on Pembrolizumab without any major  complications at this time.  Risks and benefits of continuing this treatment were discussed.  He does report some mild fatigue and tiredness but otherwise tolerated treatment without any issues.  Alternative treatment options were reviewed including Padcev as well as oral targeted therapy for FGFR as well as hospice.  At this time he is willing to continue with treatment.  We will update his scans before the next visit.  2. IV access:Peripheral veins are currently in use at this time.  Continue to defer Port-A-Cath option for the time being.   3.  Anemia: Related to malignancy and hematuria.  Hemoglobin is stable.  4.  Immune mediated complications.  He has reported mild erythematous rash  but otherwise no other complications.  Topical steroids recommended at this time.  I continue to educate him about pneumonitis, colitis and thyroid disease.   5. Follow-up:  In 3 weeks for repeat follow-up.  30  minutes were dedicated to this encounter.  Time was spent on reviewing disease status, discussing treatment options and future plan of care review.       Zola Button, MD 1/5/202211:52 AM

## 2021-01-05 DIAGNOSIS — Z794 Long term (current) use of insulin: Secondary | ICD-10-CM | POA: Diagnosis not present

## 2021-01-05 DIAGNOSIS — Z7984 Long term (current) use of oral hypoglycemic drugs: Secondary | ICD-10-CM | POA: Diagnosis not present

## 2021-01-05 DIAGNOSIS — E119 Type 2 diabetes mellitus without complications: Secondary | ICD-10-CM | POA: Diagnosis not present

## 2021-01-05 DIAGNOSIS — Z7901 Long term (current) use of anticoagulants: Secondary | ICD-10-CM | POA: Diagnosis not present

## 2021-01-05 DIAGNOSIS — Z9981 Dependence on supplemental oxygen: Secondary | ICD-10-CM | POA: Diagnosis not present

## 2021-01-05 DIAGNOSIS — C679 Malignant neoplasm of bladder, unspecified: Secondary | ICD-10-CM | POA: Diagnosis not present

## 2021-01-05 DIAGNOSIS — Z936 Other artificial openings of urinary tract status: Secondary | ICD-10-CM | POA: Diagnosis not present

## 2021-01-05 DIAGNOSIS — C7802 Secondary malignant neoplasm of left lung: Secondary | ICD-10-CM | POA: Diagnosis not present

## 2021-01-05 DIAGNOSIS — Z7951 Long term (current) use of inhaled steroids: Secondary | ICD-10-CM | POA: Diagnosis not present

## 2021-01-05 DIAGNOSIS — C7801 Secondary malignant neoplasm of right lung: Secondary | ICD-10-CM | POA: Diagnosis not present

## 2021-01-05 DIAGNOSIS — Z8551 Personal history of malignant neoplasm of bladder: Secondary | ICD-10-CM | POA: Diagnosis not present

## 2021-01-05 IMAGING — MR MR WRIST*R* W/ CM
8 series · 40 of 40 positions shown · non-contrast
Comparison: X-ray 08/01/2020

CONTRAST:  See injection documentation

CLINICAL DATA: Right wrist pain

EXAM:
MR ARTHROGRAM OF THE RIGHT WRIST
TECHNIQUE: Multiplanar, multisequence MR imaging of the right wrist was
performed following the administration of intra-articular contrast.

[Series 15: T1 fat-sat · axial · right · 3.0mm · 0.31mm/px · z∈[-79,-10]mm · 5 of 22 slices shown (1 of 3)]
[im 1/22]
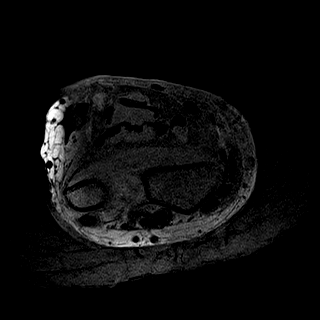
[im 6/22]
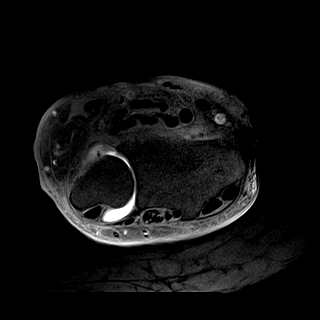
[im 11/22]
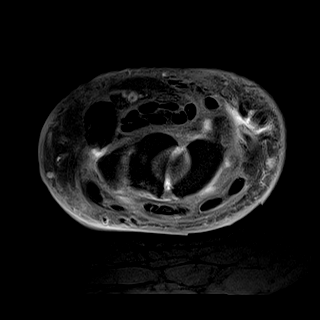
[im 16/22]
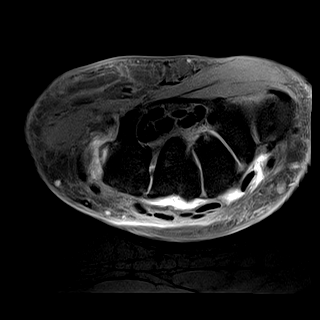
[im 22/22]
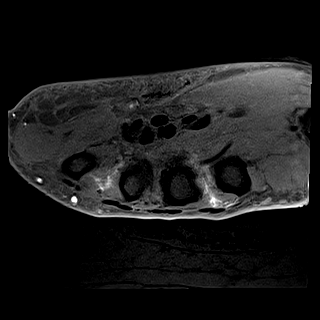

[Series 16: T2 fat-sat · axial · right · 3.0mm · 0.47mm/px · z∈[-84,-5]mm · 5 of 24 slices shown (1 of 3)]
[im 1/24]
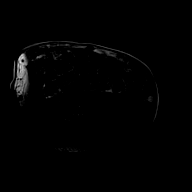
[im 6/24]
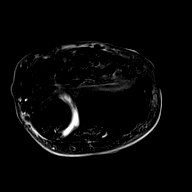
[im 12/24]
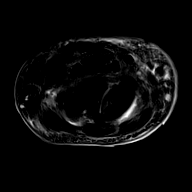
[im 18/24]
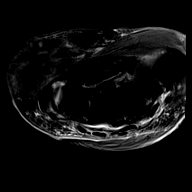
[im 24/24]
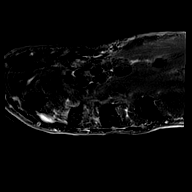

[Series 17: T1 fat-sat · coronal · right · 3.0mm · 0.52mm/px · 5 of 19 slices shown (2 of 3)]
[im 1/19]
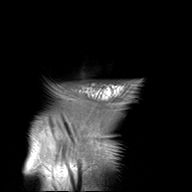
[im 5/19]
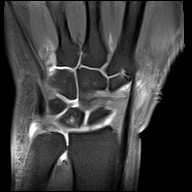
[im 10/19]
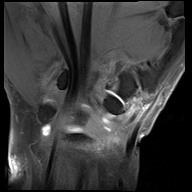
[im 14/19]
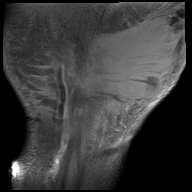
[im 19/19]
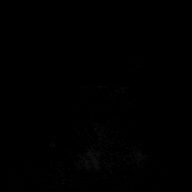

[Series 18: T1 · coronal · right · 3.0mm · 0.31mm/px · 5 of 19 slices shown]
[im 1/19]
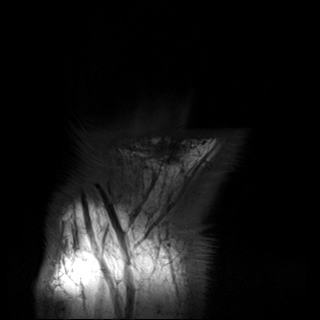
[im 5/19]
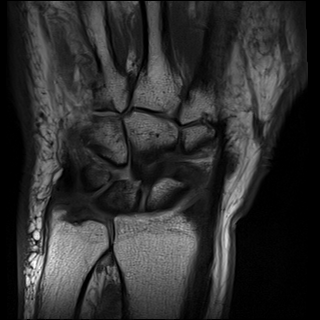
[im 10/19]
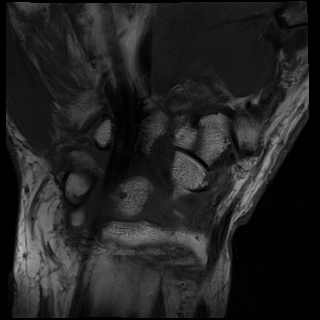
[im 14/19]
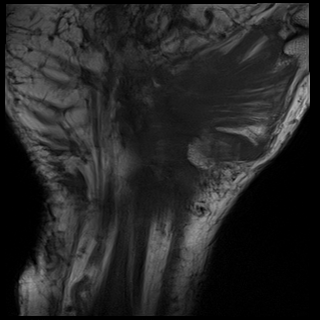
[im 19/19]
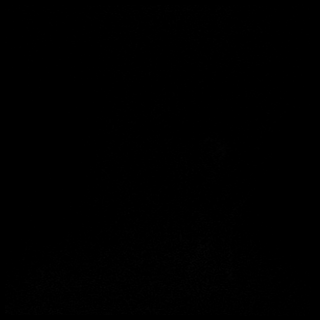

[Series 19: T2 fat-sat · coronal · right · 3.0mm · 0.31mm/px · 5 of 19 slices shown (2 of 3)]
[im 1/19]
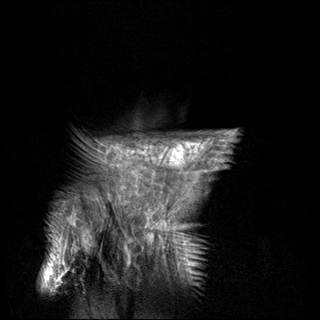
[im 5/19]
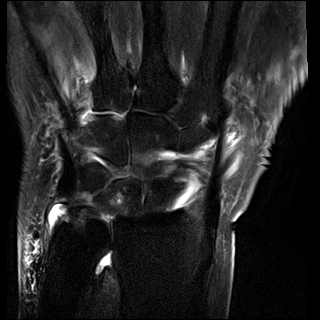
[im 10/19]
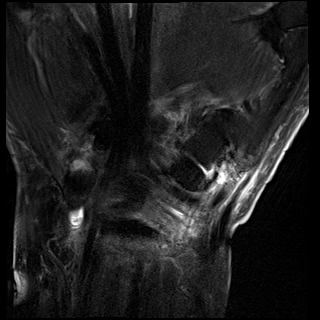
[im 14/19]
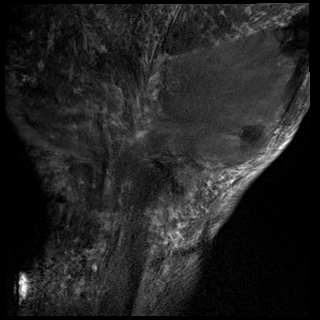
[im 19/19]
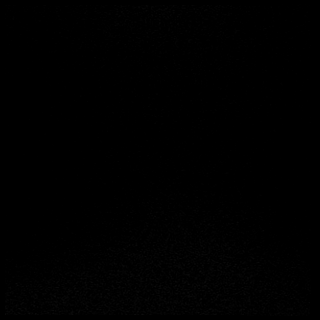

[Series 20: T1 fat-sat · sagittal · right · 3.0mm · 0.52mm/px · 8 of 30 slices shown (3 of 3)]
[im 1/30]
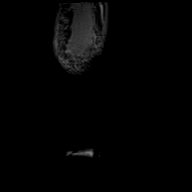
[im 5/30]
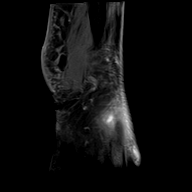
[im 9/30]
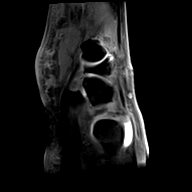
[im 13/30]
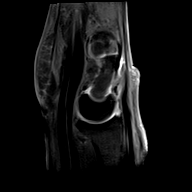
[im 17/30]
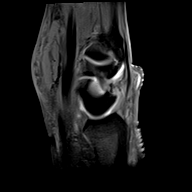
[im 21/30]
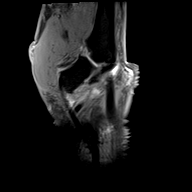
[im 25/30]
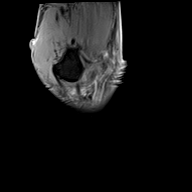
[im 30/30]
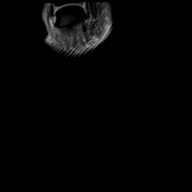

[Series 100: ax fs 1 · axial · right · 3.0mm · 0.31mm/px · 1 of 1 slices shown]
[im 1/1]
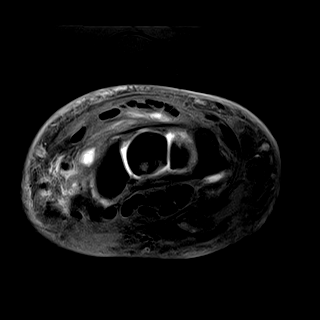

[Series 101: T2 fat-sat · axial · right · 3.0mm · 0.47mm/px · z∈[-84,-5]mm · 6 of 25 slices shown (3 of 3)]
[im 1/25]
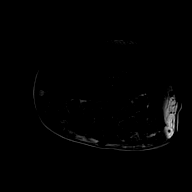
[im 5/25]
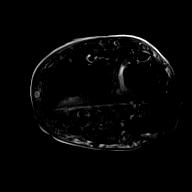
[im 10/25]
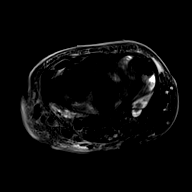
[im 15/25]
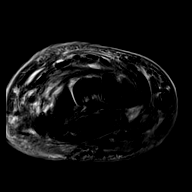
[im 20/25]
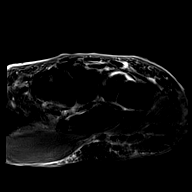
[im 25/25]
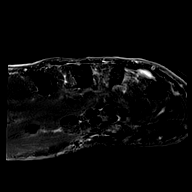

[40 of 40 positions shown; findings below may reference images not displayed]

FINDINGS: Ligaments: The dorsal and volar bands of the scapholunate ligament
are thickened and heterogeneous with partial thickness tearing
(series 15, image 13). Contrast does extend into the middle carpal
row. No widening of the scapholunate interval. Lunotriquetral
ligaments appear degenerated although grossly intact.

Triangular fibrocartilage: Large full-thickness tear of the
articular disc of the TFC with contrast filling the distal
radioulnar joint (series 19, image 5).

Tendons: Tendinosis and longitudinal split tear of the extensor
carpi ulnaris tendon. The remaining extensor tendons of the wrist
are within normal limits. Intact flexor tendons.

Carpal tunnel/median nerve: Normal carpal tunnel. Normal median
nerve.

Guyon's canal: Unremarkable.

Joint/cartilage: Mild-to-moderate arthropathy of the wrist most
notably involving the triscaphe joint. No focal erosion.

Bones/carpal alignment: No acute fracture.  No malalignment.

Other: No abnormal solid or cystic lesions identified.
IMPRESSION: 1. Full-thickness tear of the articular disc of the TFC.
2. Tendinosis and longitudinal split tear of the extensor carpi
ulnaris tendon.
3. Partial thickness degenerative tearing of the scapholunate
ligament. No widening of the scapholunate interval.
4. Mild-to-moderate arthropathy of the wrist most notably involving
the triscaphe joint.

## 2021-01-06 DIAGNOSIS — Z8551 Personal history of malignant neoplasm of bladder: Secondary | ICD-10-CM | POA: Diagnosis not present

## 2021-01-06 DIAGNOSIS — Z936 Other artificial openings of urinary tract status: Secondary | ICD-10-CM | POA: Diagnosis not present

## 2021-01-09 DIAGNOSIS — I2510 Atherosclerotic heart disease of native coronary artery without angina pectoris: Secondary | ICD-10-CM | POA: Diagnosis not present

## 2021-01-09 DIAGNOSIS — I129 Hypertensive chronic kidney disease with stage 1 through stage 4 chronic kidney disease, or unspecified chronic kidney disease: Secondary | ICD-10-CM | POA: Diagnosis not present

## 2021-01-09 DIAGNOSIS — N1831 Chronic kidney disease, stage 3a: Secondary | ICD-10-CM | POA: Diagnosis not present

## 2021-01-09 DIAGNOSIS — E1122 Type 2 diabetes mellitus with diabetic chronic kidney disease: Secondary | ICD-10-CM | POA: Diagnosis not present

## 2021-01-09 DIAGNOSIS — C679 Malignant neoplasm of bladder, unspecified: Secondary | ICD-10-CM | POA: Diagnosis not present

## 2021-01-09 DIAGNOSIS — E1136 Type 2 diabetes mellitus with diabetic cataract: Secondary | ICD-10-CM | POA: Diagnosis not present

## 2021-01-09 DIAGNOSIS — C7802 Secondary malignant neoplasm of left lung: Secondary | ICD-10-CM | POA: Diagnosis not present

## 2021-01-09 DIAGNOSIS — D631 Anemia in chronic kidney disease: Secondary | ICD-10-CM | POA: Diagnosis not present

## 2021-01-09 DIAGNOSIS — C7801 Secondary malignant neoplasm of right lung: Secondary | ICD-10-CM | POA: Diagnosis not present

## 2021-01-10 ENCOUNTER — Telehealth: Payer: Self-pay

## 2021-01-10 NOTE — Telephone Encounter (Signed)
[name] [name]) called requesting for patient's labwork on 01/25/21 be sent to Dr. Lynne Logan office [facility]).

## 2021-01-13 ENCOUNTER — Other Ambulatory Visit: Payer: Self-pay | Admitting: Oncology

## 2021-01-13 DIAGNOSIS — L299 Pruritus, unspecified: Secondary | ICD-10-CM

## 2021-01-14 DIAGNOSIS — U071 COVID-19: Secondary | ICD-10-CM | POA: Diagnosis not present

## 2021-01-14 DIAGNOSIS — J449 Chronic obstructive pulmonary disease, unspecified: Secondary | ICD-10-CM | POA: Diagnosis not present

## 2021-01-18 ENCOUNTER — Ambulatory Visit (HOSPITAL_COMMUNITY)
Admission: RE | Admit: 2021-01-18 | Discharge: 2021-01-18 | Disposition: A | Discharge status: Home / Self Care | Payer: Medicare PPO | Source: Ambulatory Visit | Attending: Oncology | Admitting: Oncology

## 2021-01-18 ENCOUNTER — Other Ambulatory Visit: Payer: Self-pay

## 2021-01-18 ENCOUNTER — Encounter (HOSPITAL_COMMUNITY): Payer: Self-pay

## 2021-01-18 DIAGNOSIS — C679 Malignant neoplasm of bladder, unspecified: Secondary | ICD-10-CM | POA: Diagnosis not present

## 2021-01-18 DIAGNOSIS — J439 Emphysema, unspecified: Secondary | ICD-10-CM | POA: Diagnosis not present

## 2021-01-18 DIAGNOSIS — R911 Solitary pulmonary nodule: Secondary | ICD-10-CM | POA: Diagnosis not present

## 2021-01-18 HISTORY — DX: Malignant neoplasm of bladder, unspecified: C67.9

## 2021-01-23 ENCOUNTER — Telehealth: Payer: Self-pay

## 2021-01-23 ENCOUNTER — Telehealth: Payer: Self-pay | Admitting: Family Medicine

## 2021-01-23 DIAGNOSIS — I129 Hypertensive chronic kidney disease with stage 1 through stage 4 chronic kidney disease, or unspecified chronic kidney disease: Secondary | ICD-10-CM | POA: Diagnosis not present

## 2021-01-23 DIAGNOSIS — E1122 Type 2 diabetes mellitus with diabetic chronic kidney disease: Secondary | ICD-10-CM | POA: Diagnosis not present

## 2021-01-23 DIAGNOSIS — C679 Malignant neoplasm of bladder, unspecified: Secondary | ICD-10-CM | POA: Diagnosis not present

## 2021-01-23 DIAGNOSIS — C7801 Secondary malignant neoplasm of right lung: Secondary | ICD-10-CM | POA: Diagnosis not present

## 2021-01-23 DIAGNOSIS — D631 Anemia in chronic kidney disease: Secondary | ICD-10-CM | POA: Diagnosis not present

## 2021-01-23 DIAGNOSIS — E1136 Type 2 diabetes mellitus with diabetic cataract: Secondary | ICD-10-CM | POA: Diagnosis not present

## 2021-01-23 DIAGNOSIS — N1831 Chronic kidney disease, stage 3a: Secondary | ICD-10-CM | POA: Diagnosis not present

## 2021-01-23 DIAGNOSIS — I2510 Atherosclerotic heart disease of native coronary artery without angina pectoris: Secondary | ICD-10-CM | POA: Diagnosis not present

## 2021-01-23 DIAGNOSIS — C7802 Secondary malignant neoplasm of left lung: Secondary | ICD-10-CM | POA: Diagnosis not present

## 2021-01-23 MED ORDER — FUROSEMIDE 20 MG PO TABS: 20.0000 mg | ORAL_TABLET | Freq: Every day | ORAL | 0 refills | Status: DC

## 2021-01-23 NOTE — Telephone Encounter (Signed)
Raechel Chute and made her aware to contact patient's PCP. She verbalized understanding.

## 2021-01-23 NOTE — Telephone Encounter (Signed)
Let them know that I called in some Lasix him.  Have him use it for couple of days and then as needed after that

## 2021-01-23 NOTE — Telephone Encounter (Signed)
Carrolyn Leigh called to advise that Pt has 1+ to 2+ swelling in legs up to thigh. Pt lungs are clear per [place]. She thinks he would benefit from fluid pill and was advised by cancer center to go through you. Please advise [facility]

## 2021-01-23 NOTE — Telephone Encounter (Signed)
Pt wife was advised [facility] 

## 2021-01-23 NOTE — Telephone Encounter (Signed)
I received a call indicating he is having some peripheral edema.  I will give him some Lasix to use for several days.

## 2021-01-23 NOTE — Telephone Encounter (Signed)
-----   Message from Wyatt Portela, MD sent at 01/23/2021 10:43 AM EST ----- They need to call his PCP regarding this. Thanks ----- Message ----- From: Tami Lin, RN Sent: 01/23/2021  10:35 AM EST To: Wyatt Portela, MD  Patient's physical therapist left a message stating she saw patient this morning and he has swelling in both legs all the way up to his lower abdomen and 1+ pitting edema in both feet. She said patient is tired and has some SOB with activity. Patient has an appointment to see you on Wednesday but the physical therapist wanted to make you aware and wanted to know if you thought the patient needed a diuretic in the meantime.  Lanelle Bal

## 2021-01-25 ENCOUNTER — Inpatient Hospital Stay: Payer: Medicare PPO | Admitting: Oncology

## 2021-01-25 ENCOUNTER — Inpatient Hospital Stay: Payer: Medicare PPO

## 2021-01-25 ENCOUNTER — Other Ambulatory Visit: Payer: Self-pay

## 2021-01-25 ENCOUNTER — Telehealth: Payer: Self-pay | Admitting: Family Medicine

## 2021-01-25 VITALS — BP 110/63 | HR 72 | Temp 97.3°F | Resp 20 | Wt 262.2 lb

## 2021-01-25 DIAGNOSIS — I2510 Atherosclerotic heart disease of native coronary artery without angina pectoris: Secondary | ICD-10-CM | POA: Diagnosis not present

## 2021-01-25 DIAGNOSIS — C679 Malignant neoplasm of bladder, unspecified: Secondary | ICD-10-CM | POA: Diagnosis not present

## 2021-01-25 DIAGNOSIS — Z5112 Encounter for antineoplastic immunotherapy: Secondary | ICD-10-CM | POA: Diagnosis not present

## 2021-01-25 DIAGNOSIS — Z791 Long term (current) use of non-steroidal anti-inflammatories (NSAID): Secondary | ICD-10-CM | POA: Diagnosis not present

## 2021-01-25 DIAGNOSIS — D649 Anemia, unspecified: Secondary | ICD-10-CM | POA: Diagnosis not present

## 2021-01-25 DIAGNOSIS — J8410 Pulmonary fibrosis, unspecified: Secondary | ICD-10-CM | POA: Diagnosis not present

## 2021-01-25 DIAGNOSIS — L309 Dermatitis, unspecified: Secondary | ICD-10-CM | POA: Diagnosis not present

## 2021-01-25 DIAGNOSIS — J439 Emphysema, unspecified: Secondary | ICD-10-CM | POA: Diagnosis not present

## 2021-01-25 DIAGNOSIS — Z7901 Long term (current) use of anticoagulants: Secondary | ICD-10-CM | POA: Diagnosis not present

## 2021-01-25 LAB — CMP (CANCER CENTER ONLY)
ALT: 24 U/L (ref 0–44)
AST: 54 U/L — ABNORMAL HIGH (ref 15–41)
Albumin: 3 g/dL — ABNORMAL LOW (ref 3.5–5.0)
Alkaline Phosphatase: 92 U/L (ref 38–126)
Anion gap: 10 (ref 5–15)
BUN: 26 mg/dL — ABNORMAL HIGH (ref 8–23)
CO2: 23 mmol/L (ref 22–32)
Calcium: 9.2 mg/dL (ref 8.9–10.3)
Chloride: 105 mmol/L (ref 98–111)
Creatinine: 1.84 mg/dL — ABNORMAL HIGH (ref 0.61–1.24)
GFR, Estimated: 38 mL/min — ABNORMAL LOW (ref >60)
Glucose, Bld: 141 mg/dL — ABNORMAL HIGH (ref 70–99)
Potassium: 4.4 mmol/L (ref 3.5–5.1)
Sodium: 138 mmol/L (ref 135–145)
Total Bilirubin: 0.6 mg/dL (ref 0.3–1.2)
Total Protein: 7.2 g/dL (ref 6.5–8.1)

## 2021-01-25 LAB — CBC WITH DIFFERENTIAL (CANCER CENTER ONLY)
Abs Immature Granulocytes: 0.02 10*3/uL (ref 0.00–0.07)
Basophils Absolute: 0.1 10*3/uL (ref 0.0–0.1)
Basophils Relative: 1 %
Eosinophils Absolute: 0.4 10*3/uL (ref 0.0–0.5)
Eosinophils Relative: 8 %
HCT: 37.7 % — ABNORMAL LOW (ref 39.0–52.0)
Hemoglobin: 11.7 g/dL — ABNORMAL LOW (ref 13.0–17.0)
Immature Granulocytes: 0 %
Lymphocytes Relative: 12 %
Lymphs Abs: 0.6 10*3/uL — ABNORMAL LOW (ref 0.7–4.0)
MCH: 29.2 pg (ref 26.0–34.0)
MCHC: 31 g/dL (ref 30.0–36.0)
MCV: 94 fL (ref 80.0–100.0)
Monocytes Absolute: 0.6 10*3/uL (ref 0.1–1.0)
Monocytes Relative: 11 %
Neutro Abs: 3.7 10*3/uL (ref 1.7–7.7)
Neutrophils Relative %: 68 %
Platelet Count: 180 10*3/uL (ref 150–400)
RBC: 4.01 MIL/uL — ABNORMAL LOW (ref 4.22–5.81)
RDW: 19.6 % — ABNORMAL HIGH (ref 11.5–15.5)
WBC Count: 5.4 10*3/uL (ref 4.0–10.5)
nRBC: 0 % (ref 0.0–0.2)

## 2021-01-25 LAB — TSH: TSH: 2.634 u[IU]/mL (ref 0.320–4.118)

## 2021-01-25 MED ORDER — SODIUM CHLORIDE 0.9 % IV SOLN
Freq: Once | INTRAVENOUS | Status: AC
  Filled 2021-01-25: qty 250

## 2021-01-25 MED ORDER — PEMBROLIZUMAB CHEMO INJECTION 100 MG/4ML
200.0000 mg | Freq: Once | INTRAVENOUS | Status: AC
  Administered 2021-01-25: 200 mg via INTRAVENOUS
  Filled 2021-01-25: qty 8

## 2021-01-25 NOTE — Telephone Encounter (Signed)
Pt wife called  He took 2nd dose of diuretic this morning His swelling has gotten worse On 1/5 his weight was 250 and today it is 262  She wants to know how long should it take to stop the swelling and for him to lose some fluid

## 2021-01-25 NOTE — Progress Notes (Signed)
  MD states okay to treat with creat 1.8

## 2021-01-25 NOTE — Telephone Encounter (Signed)
Double the dose 

## 2021-01-25 NOTE — Progress Notes (Signed)
Hematology and Oncology Follow Up Visit  Jimmy [name] [id number] June 06, 1946 75 y.o. 01/25/2021 11:28 AM Jimmy [name], MDLalonde, Jimmy Jarvis, MD   Principle Diagnosis: 75 year old man with stage IV high-grade urothelial carcinoma of the bladder with pulmonary involvement in July 2021.  He presented with localized disease in 2020.  Prior Therapy: TURBT with right ureteral stent placement as well as left ureteral stent placement at that time.  The final pathology showed infiltrating high-grade papillary adenocarcinoma invading into the detrusor muscle with lymphovascular invasion identified.  This was completed on 06/22/2019.    Neoadjuvant chemotherapy started on August 07, 2019 with cisplatin and gemcitabine started on August 07, 2019.  He completed 4 cycles of chemotherapy on October 16, 2019.  He is status post radical cystectomy and lymph node dissection on December 10, 2019.  The final pathology showed 1.5 cm residual tumor with invasion into the perivesicular soft tissue.  Final pathological stage was T3a and 0 with 0 out of 10 lymph nodes involved.  Current therapy: Pembrolizumab 200 mg every 3 weeks started on August 10, 2020.  He is here for cycle 8 of therapy.  Interim History: Jimmy [name] returns today for a follow-up visit.  Since the last visit, he reports no major changes in his health.  He has reported increase in his lower extremity swelling bilaterally and has been described diuretics by his primary care physician.  He has a reported mild erythematous rash that is rather diffuse but not pruritic in nature.  He denies any complications related to Pembrolizumab otherwise.  He denies any nausea vomiting or abdominal pain.  He did start having hematuria which has been intermittent in nature.                 Medications: Reviewed without changes. Current Outpatient Medications  Medication Sig Dispense Refill  . Accu-Chek Softclix Lancets lancets 1 each by Other route in  the morning, at noon, and at bedtime. Use as instructed 300 each 2  . diclofenac Sodium (VOLTAREN) 1 % GEL Apply topically as needed.    [name] ELIQUIS 5 MG TABS tablet TAKE 1 TABLET BY MOUTH TWICE A DAY 180 tablet 1  . ferrous sulfate 325 (65 FE) MG tablet Take 325 mg by mouth daily with breakfast.    . furosemide (LASIX) 20 MG tablet Take 1 tablet (20 mg total) by mouth daily. 30 tablet 0  . glucose blood test strip 1 each by Other route in the morning, at noon, and at bedtime. Use as instructed 300 each 2  . hydrOXYzine (ATARAX/VISTARIL) 10 MG tablet TAKE 1 TABLET BY MOUTH THREE TIMES A DAY AS NEEDED 30 tablet 2  . insulin detemir (LEVEMIR) 100 UNIT/ML injection Inject 16 Units into the skin daily.    . Insulin Pen Needle 32G X 4 MM MISC Use for injections twice daily 200 each 1  . Ipratropium-Albuterol (COMBIVENT RESPIMAT) 20-100 MCG/ACT AERS respimat Inhale 1 puff into the lungs in the morning, at noon, in the evening, and at bedtime. Rinse mouth after use 4 g 5  . metFORMIN (GLUCOPHAGE) 1000 MG tablet Take 1 tablet (1,000 mg total) by mouth daily. 90 tablet 1  . metoprolol tartrate (LOPRESSOR) 25 MG tablet TAKE 1/2 TABLET BY MOUTH TWICE A DAY 90 tablet 1  . Multiple Vitamin (MULTIVITAMIN WITH MINERALS) TABS tablet Take 1 tablet by mouth daily. 30 tablet 0  . pantoprazole (PROTONIX) 40 MG tablet TAKE 1 TABLET BY MOUTH TWICE A DAY (Patient taking differently:  daily.) 180 tablet 1  . traMADol (ULTRAM) 50 MG tablet TAKE 1 TABLET BY MOUTH EVERY 6 HOURS AS NEEDED 60 tablet 0   No current facility-administered medications for this visit.     Allergies:  Allergies  Allergen Reactions  . Ace Inhibitors Cough  . Cephalexin Hives, Diarrhea and Nausea Only    Tolerates Ancef, Rocephin, Cefoxitin  . Crestor [Rosuvastatin Calcium]   . Lipitor [Atorvastatin Calcium] Other (See Comments)    MUSCLE PAINS   . Plavix [Clopidogrel Bisulfate] Hives  . Vancomycin Nausea And Vomiting    Po route caused  n/v.        Physical Exam:       Blood pressure 110/63, pulse 72, temperature (!) 97.3 F (36.3 C), temperature source Tympanic, resp. rate 20, weight 262 lb 3.2 oz (118.9 kg), SpO2 97 %.     ECOG: 2   General appearance: Alert, awake without any distress. Head: Atraumatic without abnormalities Oropharynx: Without any thrush or ulcers. Eyes: No scleral icterus. Lymph nodes: No lymphadenopathy noted in the cervical, supraclavicular, or axillary nodes Heart:regular rate and rhythm, without any murmurs or gallops.    Bilateral lower extremity edema noted. [name]: Clear to auscultation without any rhonchi, wheezes or dullness to percussion. Abdomin: Soft, nontender without any shifting dullness or ascites. Musculoskeletal: No clubbing or cyanosis. Neurological: No motor or sensory deficits. Skin: Mild erythema noted on upper and lower extremities.              Lab Results: Lab Results  Component Value Date   WBC 5.6 01/04/2021   HGB 10.9 (L) 01/04/2021   HCT 34.9 (L) 01/04/2021   MCV 92.1 01/04/2021   PLT 173 01/04/2021     Chemistry      Component Value Date/Time   NA 140 01/04/2021 1115   NA 140 08/11/2020 1451   K 4.4 01/04/2021 1115   CL 110 01/04/2021 1115   CO2 19 (L) 01/04/2021 1115   BUN 24 (H) 01/04/2021 1115   BUN 20 08/11/2020 1451   CREATININE 1.61 (H) 01/04/2021 1115   CREATININE 0.89 11/19/2016 0920      Component Value Date/Time   CALCIUM 9.5 01/04/2021 1115   ALKPHOS 86 01/04/2021 1115   AST 52 (H) 01/04/2021 1115   ALT 29 01/04/2021 1115   BILITOT 0.4 01/04/2021 1115     IMPRESSION: 1. Multiple bilateral pulmonary nodules are stable or diminished compared to prior examination, consistent with treatment response. 2. Unchanged enlarged subcarinal lymph node, suspicious for metastasis. Attention on follow-up. 3. No noncontrast CT evidence of metastatic disease in the abdomen or pelvis. 4. Status post cystectomy and right lower  quadrant ileal conduit urinary diversion. Unchanged post treatment appearance, of the pelvis, including prior prostate brachytherapy. 5. No hydronephrosis. 6. Moderate to severe emphysema with mild underlying bibasilar predominant pulmonary fibrosis. 7. Coronary artery disease.  Impression and Plan:   75 year old man with:  1.  Stage IV high-grade urothelial carcinoma of the bladder with pulmonary involvement diagnosed in July 2021.    The natural course of his disease and imaging studies were reviewed at this time.  CT scan on January 18, 2021 was personally reviewed and discussed today.  He has a positive response to therapy with slight decline in his pulmonary nodules and no new site of disease progression.  He continues to be on Pembrolizumab without any major complications.  Risks and benefits of continuing this treatment long-term were reviewed.  Alternative options such as Padcev among others  were reviewed.  After discussion today he is agreeable to continue.  2. IV access:Risks and benefits of a Port-A-Cath or PICC line insertion were discussed again.  He is currently using a peripheral veins.  He has deferred the option of any additional catheter placement.   3.  Anemia: Hemoglobin remained stable without any need for transfusion or growth factor support.  His anemia is related to malignancy and hematuria.  His hemoglobin today continues to improve and does not require any intervention.   4.  Immune mediated complications.  I continue to educate him about potential complications long-term.  Complication such as thyroid disease, hepatitis, pneumonitis, colitis and arthritis.  He has experienced element of dermatitis.   5. Follow-up:  He will return for the next cycle of therapy in 3 to 4  weeks.  30  minutes were spent on this visit.  The time was dedicated to reviewing his disease status, reviewing imaging studies and alternative treatment options for the  future.      Zola Button, MD 1/26/202211:28 AM

## 2021-01-25 NOTE — Patient Instructions (Signed)
[facility] Discharge Instructions for Patients Receiving Chemotherapy  Today you received the following chemotherapy agents:  Keytruda.  To help prevent nausea and vomiting after your treatment, we encourage you to take your nausea medication as directed.   If you develop nausea and vomiting that is not controlled by your nausea medication, call the clinic.   BELOW ARE SYMPTOMS THAT SHOULD BE REPORTED IMMEDIATELY:  *FEVER GREATER THAN 100.5 F  *CHILLS WITH OR WITHOUT FEVER  NAUSEA AND VOMITING THAT IS NOT CONTROLLED WITH YOUR NAUSEA MEDICATION  *UNUSUAL SHORTNESS OF BREATH  *UNUSUAL BRUISING OR BLEEDING  TENDERNESS IN MOUTH AND THROAT WITH OR WITHOUT PRESENCE OF ULCERS  *URINARY PROBLEMS  *BOWEL PROBLEMS  UNUSUAL RASH Items with * indicate a potential emergency and should be followed up as soon as possible.  Feel free to call the clinic should you have any questions or concerns. The clinic phone number is [phone].  Please show the CHEMO ALERT CARD at check-in to the Emergency Department and triage nurse.    

## 2021-01-26 NOTE — Telephone Encounter (Signed)
Pt wife was advised [facility] 

## 2021-01-30 DIAGNOSIS — D631 Anemia in chronic kidney disease: Secondary | ICD-10-CM | POA: Diagnosis not present

## 2021-01-30 DIAGNOSIS — E1136 Type 2 diabetes mellitus with diabetic cataract: Secondary | ICD-10-CM | POA: Diagnosis not present

## 2021-01-30 DIAGNOSIS — N1831 Chronic kidney disease, stage 3a: Secondary | ICD-10-CM | POA: Diagnosis not present

## 2021-01-30 DIAGNOSIS — I2510 Atherosclerotic heart disease of native coronary artery without angina pectoris: Secondary | ICD-10-CM | POA: Diagnosis not present

## 2021-01-30 DIAGNOSIS — E1122 Type 2 diabetes mellitus with diabetic chronic kidney disease: Secondary | ICD-10-CM | POA: Diagnosis not present

## 2021-01-30 DIAGNOSIS — I129 Hypertensive chronic kidney disease with stage 1 through stage 4 chronic kidney disease, or unspecified chronic kidney disease: Secondary | ICD-10-CM | POA: Diagnosis not present

## 2021-01-30 DIAGNOSIS — C7802 Secondary malignant neoplasm of left lung: Secondary | ICD-10-CM | POA: Diagnosis not present

## 2021-01-30 DIAGNOSIS — C7801 Secondary malignant neoplasm of right lung: Secondary | ICD-10-CM | POA: Diagnosis not present

## 2021-01-30 DIAGNOSIS — C679 Malignant neoplasm of bladder, unspecified: Secondary | ICD-10-CM | POA: Diagnosis not present

## 2021-02-01 ENCOUNTER — Telehealth: Payer: Self-pay

## 2021-02-01 ENCOUNTER — Other Ambulatory Visit: Payer: Self-pay | Admitting: Family Medicine

## 2021-02-01 ENCOUNTER — Other Ambulatory Visit: Payer: Self-pay | Admitting: Oncology

## 2021-02-01 NOTE — Telephone Encounter (Signed)
Pt wife called and advised that pt will need a 90 script for his furosemide. Pt was advise to take 2 and since then pt wife say there is no change. Please advise if me can be fill as BID and how he will need to continue to take 2 tablets. [place]

## 2021-02-02 ENCOUNTER — Other Ambulatory Visit: Payer: Self-pay

## 2021-02-02 DIAGNOSIS — C7801 Secondary malignant neoplasm of right lung: Secondary | ICD-10-CM | POA: Diagnosis not present

## 2021-02-02 DIAGNOSIS — Z8546 Personal history of malignant neoplasm of prostate: Secondary | ICD-10-CM | POA: Diagnosis not present

## 2021-02-02 DIAGNOSIS — C678 Malignant neoplasm of overlapping sites of bladder: Secondary | ICD-10-CM | POA: Diagnosis not present

## 2021-02-02 DIAGNOSIS — R310 Gross hematuria: Secondary | ICD-10-CM | POA: Diagnosis not present

## 2021-02-02 MED ORDER — FUROSEMIDE 20 MG PO TABS: 20.0000 mg | ORAL_TABLET | Freq: Two times a day (BID) | ORAL | 0 refills | Status: DC

## 2021-02-02 MED ORDER — FUROSEMIDE 20 MG PO TABS: 20.0000 mg | ORAL_TABLET | Freq: Every day | ORAL | 0 refills | Status: DC

## 2021-02-02 NOTE — Telephone Encounter (Signed)
Appt

## 2021-02-02 NOTE — Telephone Encounter (Signed)
Pt wife was advised Furosemide was sent it to cover him until his appt 02/09/21. [place]

## 2021-02-06 DIAGNOSIS — C7802 Secondary malignant neoplasm of left lung: Secondary | ICD-10-CM | POA: Diagnosis not present

## 2021-02-06 DIAGNOSIS — N1831 Chronic kidney disease, stage 3a: Secondary | ICD-10-CM | POA: Diagnosis not present

## 2021-02-06 DIAGNOSIS — I129 Hypertensive chronic kidney disease with stage 1 through stage 4 chronic kidney disease, or unspecified chronic kidney disease: Secondary | ICD-10-CM | POA: Diagnosis not present

## 2021-02-06 DIAGNOSIS — I2510 Atherosclerotic heart disease of native coronary artery without angina pectoris: Secondary | ICD-10-CM | POA: Diagnosis not present

## 2021-02-06 DIAGNOSIS — D631 Anemia in chronic kidney disease: Secondary | ICD-10-CM | POA: Diagnosis not present

## 2021-02-06 DIAGNOSIS — E1122 Type 2 diabetes mellitus with diabetic chronic kidney disease: Secondary | ICD-10-CM | POA: Diagnosis not present

## 2021-02-06 DIAGNOSIS — E1136 Type 2 diabetes mellitus with diabetic cataract: Secondary | ICD-10-CM | POA: Diagnosis not present

## 2021-02-06 DIAGNOSIS — C7801 Secondary malignant neoplasm of right lung: Secondary | ICD-10-CM | POA: Diagnosis not present

## 2021-02-06 DIAGNOSIS — C679 Malignant neoplasm of bladder, unspecified: Secondary | ICD-10-CM | POA: Diagnosis not present

## 2021-02-09 ENCOUNTER — Other Ambulatory Visit: Payer: Self-pay

## 2021-02-09 ENCOUNTER — Telehealth: Payer: Self-pay

## 2021-02-09 ENCOUNTER — Ambulatory Visit: Payer: Medicare PPO | Admitting: Family Medicine

## 2021-02-09 DIAGNOSIS — N1831 Chronic kidney disease, stage 3a: Secondary | ICD-10-CM

## 2021-02-09 DIAGNOSIS — Z789 Other specified health status: Secondary | ICD-10-CM | POA: Diagnosis not present

## 2021-02-09 DIAGNOSIS — E1159 Type 2 diabetes mellitus with other circulatory complications: Secondary | ICD-10-CM

## 2021-02-09 DIAGNOSIS — E785 Hyperlipidemia, unspecified: Secondary | ICD-10-CM

## 2021-02-09 DIAGNOSIS — Z8546 Personal history of malignant neoplasm of prostate: Secondary | ICD-10-CM | POA: Diagnosis not present

## 2021-02-09 DIAGNOSIS — E1169 Type 2 diabetes mellitus with other specified complication: Secondary | ICD-10-CM

## 2021-02-09 DIAGNOSIS — E118 Type 2 diabetes mellitus with unspecified complications: Secondary | ICD-10-CM

## 2021-02-09 DIAGNOSIS — I152 Hypertension secondary to endocrine disorders: Secondary | ICD-10-CM

## 2021-02-09 DIAGNOSIS — C679 Malignant neoplasm of bladder, unspecified: Secondary | ICD-10-CM | POA: Diagnosis not present

## 2021-02-09 DIAGNOSIS — Z0000 Encounter for general adult medical examination without abnormal findings: Secondary | ICD-10-CM

## 2021-02-09 DIAGNOSIS — C670 Malignant neoplasm of trigone of bladder: Secondary | ICD-10-CM | POA: Diagnosis not present

## 2021-02-09 DIAGNOSIS — R609 Edema, unspecified: Secondary | ICD-10-CM

## 2021-02-09 LAB — POCT GLYCOSYLATED HEMOGLOBIN (HGB A1C): Hemoglobin A1C: 6.1 % — AB (ref 4.0–5.6)

## 2021-02-09 MED ORDER — FUROSEMIDE 20 MG PO TABS: 20.0000 mg | ORAL_TABLET | Freq: Every day | ORAL | 3 refills | Status: AC

## 2021-02-09 NOTE — Telephone Encounter (Signed)
Called [facility] rep to advise verbal of wrapping or taking measures to help reduce fluid in legs [facility]

## 2021-02-09 NOTE — Progress Notes (Signed)
Jimmy [name] is a 75 y.o. male who presents for annual wellness visit and follow-up on chronic medical conditions.  He continues to have difficulty with bilateral leg edema.  He has been using Lasix but has had minimal response to this.  He continues to be followed by oncology as well as urology.  He has metastatic disease from bladder cancer.  Presently he does not have a Port-A-Cath in.  He does have a urostomy bag.  He continues on 16 units of insulin daily.  His blood sugars in the morning usually run below 130.  He has had no history of hypoglycemia.  He continues on Metformin.  Does have underlying CKD 3 with a GFR of 45.  Continues to use Combivent for his breathing which seems to help.  He continues on metoprolol and Lasix.  He is also taking Eliquis.  He does have statin intolerance.  He has tried multiple statin drugs with no success.   Immunizations and Health Maintenance Immunization History  Administered Date(s) Administered  . DTaP 04/04/2005  . Fluad Quad(high Dose 65+) 08/20/2019, 10/28/2020  . Influenza Split 09/16/2012  . Influenza Whole 10/08/2007, 11/27/2010, 08/29/2011  . Influenza, High Dose Seasonal PF 10/06/2013, 11/10/2014, 09/14/2015, 09/06/2016, 08/30/2017, 11/03/2018  . PFIZER(Purple Top)SARS-COV-2 Vaccination 05/11/2020, 06/03/2020, 01/04/2021  . Pneumococcal Conjugate-13 03/20/2010  . Pneumococcal Polysaccharide-23 10/05/2005, 03/20/2010  . Tdap 11/30/2015  . Zoster 03/20/2010  . Zoster Recombinat (Shingrix) 02/27/2017, 08/29/2017   Health Maintenance Due  Topic Date Due  . PNA vac Low Risk Adult (2 of 2 - PPSV23) 05/07/2011  . FOOT EXAM  01/07/2015  . URINE MICROALBUMIN  07/29/2018  . OPHTHALMOLOGY EXAM  01/30/2019    Last colonoscopy: Last PSA:-N/A Dentist:? Ophtho:? Exercise: Physical activity is limited due to his the edema  Other doctors caring for patient include:Shadad Borden  Advanced Directives: Does Patient Have a Medical Advance  Directive?: Yes Type of Advance Directive: Living will,Healthcare Power of Attorney Does patient want to make changes to medical advance directive?: No - Patient declined Copy of [place] in Chart?: Yes - validated most recent copy scanned in chart (See row information)  Depression screen:  See questionnaire below.     Depression screen [facility] 2/9 02/09/2021 07/28/2020 06/03/2019 08/04/2018 03/27/2017  Decreased Interest 1 0 0 0 0  Down, Depressed, Hopeless 0 0 0 0 0  PHQ - 2 Score 1 0 0 0 0  Some recent data might be hidden    Fall Screen: See Questionaire below.   Fall Risk  02/09/2021 07/28/2020 06/03/2019 08/04/2018 03/27/2017  Falls in the past year? 0 0 0 No No  Number falls in past yr: 0 - - - -  Injury with Fall? 0 - - - -  Risk for fall due to : Impaired balance/gait;Impaired mobility - - - -  Follow up Falls evaluation completed - - - -    ADL screen:  See questionnaire below.  Functional Status Survey: Is the patient deaf or have difficulty hearing?: Yes Does the patient have difficulty seeing, even when wearing glasses/contacts?: No Does the patient have difficulty concentrating, remembering, or making decisions?: No Does the patient have difficulty walking or climbing stairs?: Yes (wife helps) Does the patient have difficulty dressing or bathing?: Yes (wife helps) Does the patient have difficulty doing errands alone such as visiting a doctor's office or shopping?: Yes (wife helps)   Review of Systems  Constitutional: -, , -anorexia, -fatigue Allergy: -sneezing, -itching, -congestion Dermatology: denies changing  moles, rash, lumps ENT: -runny nose, -ear pain, -sore throat,  Cardiology:  -chest pain, -palpitations, -orthopnea, Respiratory: -cough, -shortness of breath, -dyspnea on exertion, -wheezing,  Gastroenterology: -abdominal pain, -nausea, -vomiting, -diarrhea, -constipation, -dysphagia Hematology: -bleeding or bruising problems Musculoskeletal:  -arthralgias, -myalgias, -joint swelling, -back pain, - Ophthalmology: -vision changes,  Urology: -dysuria, -difficulty urinating,  -urinary frequency, -urgency, incontinence Neurology: -, -numbness, , -memory loss, -falls, -dizziness    PHYSICAL EXAM:  BP 102/60   Pulse 76   Temp 97.7 F (36.5 C)   SpO2 94%   General Appearance: Alert, cooperative, no distress, appears stated age Head: Normocephalic, without obvious abnormality, atraumatic Eyes: PERRL, conjunctiva/corneas clear, EOM's intact, fundi benign Ears: Normal TM's and external ear canals Nose: Nares normal, mucosa normal, no drainage or sinus   tenderness Throat: Lips, mucosa, and tongue normal; teeth and gums normal Neck: Supple, no lymphadenopathy, thyroid:no enlargement/tenderness/nodules; no carotid bruit or JVD Lungs: Clear to auscultation bilaterally without wheezes, rales or ronchi; respirations unlabored Heart: Iregular rate and rhythm, S1 and S2 normal, no murmur, rub or gallop Abdomen: Soft, non-tender, nondistended, normoactive bowel sounds, no masses, no hepatosplenomegaly Extremities: Diffuse lower extremity edema is noted. Pulses: 2+ and symmetric all extremities Skin: Skin color, texture, turgor normal, no rashes or lesions Lymph nodes: Cervical, supraclavicular, and axillary nodes normal Neurologic: CNII-XII intact, normal strength, sensation and gait; reflexes 2+ and symmetric throughout   Psych: Normal mood, affect, hygiene and grooming Hemoglobin A1c is 6.1. ASSESSMENT/PLAN: Discussed the edema and think the best way to handle this would be mechanical.  My concern is this could be related to lymphatic obstruction from his bladder cancer with metastasis.  We will have home health come by to see if they can help with trying to reduce the edema mechanically.  We will also reduce his insulin requirement to 14 units and monitor this.  Might possibly be able to stop the insulin.    Medicare Attestation I  have personally reviewed: The patient's medical and social history Their use of alcohol, tobacco or illicit drugs Their current medications and supplements The patient's functional ability including ADLs,fall risks, home safety risks, cognitive, and hearing and visual impairment Diet and physical activities Evidence for depression or mood disorders  The patient's weight, height, and BMI have been recorded in the chart.  I have made referrals, counseling, and provided education to the patient based on review of the above and I have provided the patient with a written personalized care plan for preventive services.     Jill Alexanders, MD   02/09/2021

## 2021-02-09 NOTE — Patient Instructions (Addendum)
  Jimmy [name] , Thank you for taking time to come for your Medicare Wellness Visit. I appreciate your ongoing commitment to your health goals. Please review the following plan we discussed and let me know if I can assist you in the future.   These are the goals we discussed: Please cut his Levemir down to 14 units and see how he does for the next week or so.  We might continue to lower this and see how he does because I really do not want him below 100 This is a list of the screening recommended for you and due dates:  Health Maintenance  Topic Date Due  . Pneumonia vaccines (2 of 2 - PPSV23) 05/07/2011  . Complete foot exam   01/07/2015  . Urine Protein Check  07/29/2018  . Eye exam for diabetics  01/30/2019  . Hemoglobin A1C  04/28/2021  . COVID-19 Vaccine (4 - Booster for Pfizer series) 07/04/2021  . Colon Cancer Screening  02/01/2025  . Tetanus Vaccine  11/29/2025  . Flu Shot  Completed  .  Hepatitis C: One time screening is recommended by Center for Disease Control  (CDC) for  adults born from [birth year] through 1965.   Completed

## 2021-02-13 ENCOUNTER — Telehealth: Payer: Self-pay | Admitting: Family Medicine

## 2021-02-13 ENCOUNTER — Other Ambulatory Visit: Payer: Self-pay | Admitting: Medical

## 2021-02-13 ENCOUNTER — Telehealth: Payer: Self-pay

## 2021-02-13 DIAGNOSIS — R601 Generalized edema: Secondary | ICD-10-CM

## 2021-02-13 DIAGNOSIS — C679 Malignant neoplasm of bladder, unspecified: Secondary | ICD-10-CM

## 2021-02-13 NOTE — Telephone Encounter (Signed)
Received call from [facility] PT with [facility] stating that the patient has generalized edema, painful scrotum due to the edema, and mobility is difficult as well. No other s/s of distress. She stated that PCP stopped the Lasix because it has been ineffective in managing the edema. Spoke with Sandi Mealy PA and scheduling message sent for lab and f/u [facility] appt on 2/15. Communicated the [facility] follow up and Tillie Rung stated that she will contact the patient and make him aware that scheduling will be calling with appt times for 2/15.

## 2021-02-13 NOTE — Telephone Encounter (Signed)
LVM ADVISING OF OK. Cogswell

## 2021-02-13 NOTE — Telephone Encounter (Signed)
Apparently the swelling is gotten much worse.  He did have a CT of his pelvis which did not show any adenopathy.  Call the lab and let them know that we are going to call Dr. Hazeline Junker office and have him follow-up on his

## 2021-02-13 NOTE — Telephone Encounter (Signed)
Jimmy [name] was advised. She also called Dr. Alen Blew as well. Right now he is elevating everything. [name]

## 2021-02-13 NOTE — Telephone Encounter (Signed)
OK 

## 2021-02-13 NOTE — Telephone Encounter (Signed)
Physical therapist Tillie Rung called and needs verbal orders to start seeing pt. Tillie Rung can be reached [phone]

## 2021-02-13 NOTE — Telephone Encounter (Signed)
Received call from patient spouse stating that they patient is bed bound due to the edema and she is not able to bring him in for an appointment and she does not want to take him to the ED. She also stated that he is having increased pain and she would like to increase his Tramadol from 1 tablet to 2. Explained that a message has been sent to Dr. Alen Blew and someone will follow up tomorrow.

## 2021-02-13 NOTE — Telephone Encounter (Signed)
Tillie Rung Physical therapist with [facility] called to report new onset of scrotal edema, with 3 blisters on scrotum and penis is also swollen.  Scrotum is about 6 inches,  Her T#[phone]

## 2021-02-14 ENCOUNTER — Emergency Department (HOSPITAL_COMMUNITY): Payer: Medicare PPO

## 2021-02-14 ENCOUNTER — Telehealth: Payer: Self-pay

## 2021-02-14 ENCOUNTER — Other Ambulatory Visit: Payer: Self-pay

## 2021-02-14 ENCOUNTER — Encounter (HOSPITAL_COMMUNITY): Payer: Self-pay

## 2021-02-14 ENCOUNTER — Inpatient Hospital Stay (HOSPITAL_COMMUNITY)
Admission: EM | Admit: 2021-02-14 | Discharge: 2021-02-28 | DRG: 687 | Disposition: E | Payer: Medicare PPO | Attending: Internal Medicine | Admitting: Internal Medicine

## 2021-02-14 DIAGNOSIS — Z66 Do not resuscitate: Secondary | ICD-10-CM | POA: Diagnosis not present

## 2021-02-14 DIAGNOSIS — E1122 Type 2 diabetes mellitus with diabetic chronic kidney disease: Secondary | ICD-10-CM | POA: Diagnosis not present

## 2021-02-14 DIAGNOSIS — R319 Hematuria, unspecified: Secondary | ICD-10-CM | POA: Diagnosis not present

## 2021-02-14 DIAGNOSIS — Z923 Personal history of irradiation: Secondary | ICD-10-CM

## 2021-02-14 DIAGNOSIS — M818 Other osteoporosis without current pathological fracture: Secondary | ICD-10-CM | POA: Diagnosis present

## 2021-02-14 DIAGNOSIS — Z743 Need for continuous supervision: Secondary | ICD-10-CM | POA: Diagnosis not present

## 2021-02-14 DIAGNOSIS — E785 Hyperlipidemia, unspecified: Secondary | ICD-10-CM | POA: Diagnosis present

## 2021-02-14 DIAGNOSIS — N200 Calculus of kidney: Secondary | ICD-10-CM | POA: Diagnosis present

## 2021-02-14 DIAGNOSIS — C7801 Secondary malignant neoplasm of right lung: Secondary | ICD-10-CM | POA: Diagnosis not present

## 2021-02-14 DIAGNOSIS — C799 Secondary malignant neoplasm of unspecified site: Secondary | ICD-10-CM | POA: Diagnosis not present

## 2021-02-14 DIAGNOSIS — Z515 Encounter for palliative care: Secondary | ICD-10-CM

## 2021-02-14 DIAGNOSIS — N1831 Chronic kidney disease, stage 3a: Secondary | ICD-10-CM | POA: Diagnosis not present

## 2021-02-14 DIAGNOSIS — E7800 Pure hypercholesterolemia, unspecified: Secondary | ICD-10-CM | POA: Diagnosis present

## 2021-02-14 DIAGNOSIS — Z87442 Personal history of urinary calculi: Secondary | ICD-10-CM

## 2021-02-14 DIAGNOSIS — E1169 Type 2 diabetes mellitus with other specified complication: Secondary | ICD-10-CM | POA: Diagnosis present

## 2021-02-14 DIAGNOSIS — I10 Essential (primary) hypertension: Secondary | ICD-10-CM | POA: Diagnosis not present

## 2021-02-14 DIAGNOSIS — R451 Restlessness and agitation: Secondary | ICD-10-CM | POA: Diagnosis not present

## 2021-02-14 DIAGNOSIS — I5023 Acute on chronic systolic (congestive) heart failure: Secondary | ICD-10-CM | POA: Diagnosis not present

## 2021-02-14 DIAGNOSIS — G893 Neoplasm related pain (acute) (chronic): Secondary | ICD-10-CM | POA: Diagnosis present

## 2021-02-14 DIAGNOSIS — Z20822 Contact with and (suspected) exposure to covid-19: Secondary | ICD-10-CM | POA: Diagnosis not present

## 2021-02-14 DIAGNOSIS — N1832 Chronic kidney disease, stage 3b: Secondary | ICD-10-CM | POA: Diagnosis present

## 2021-02-14 DIAGNOSIS — R601 Generalized edema: Secondary | ICD-10-CM | POA: Diagnosis present

## 2021-02-14 DIAGNOSIS — J449 Chronic obstructive pulmonary disease, unspecified: Secondary | ICD-10-CM | POA: Diagnosis not present

## 2021-02-14 DIAGNOSIS — Z825 Family history of asthma and other chronic lower respiratory diseases: Secondary | ICD-10-CM

## 2021-02-14 DIAGNOSIS — C679 Malignant neoplasm of bladder, unspecified: Secondary | ICD-10-CM | POA: Diagnosis not present

## 2021-02-14 DIAGNOSIS — N4889 Other specified disorders of penis: Secondary | ICD-10-CM | POA: Diagnosis present

## 2021-02-14 DIAGNOSIS — R0689 Other abnormalities of breathing: Secondary | ICD-10-CM | POA: Diagnosis not present

## 2021-02-14 DIAGNOSIS — R609 Edema, unspecified: Secondary | ICD-10-CM | POA: Diagnosis not present

## 2021-02-14 DIAGNOSIS — I2510 Atherosclerotic heart disease of native coronary artery without angina pectoris: Secondary | ICD-10-CM | POA: Diagnosis not present

## 2021-02-14 DIAGNOSIS — H25813 Combined forms of age-related cataract, bilateral: Secondary | ICD-10-CM | POA: Diagnosis present

## 2021-02-14 DIAGNOSIS — K7460 Unspecified cirrhosis of liver: Secondary | ICD-10-CM | POA: Diagnosis not present

## 2021-02-14 DIAGNOSIS — I129 Hypertensive chronic kidney disease with stage 1 through stage 4 chronic kidney disease, or unspecified chronic kidney disease: Secondary | ICD-10-CM | POA: Diagnosis not present

## 2021-02-14 DIAGNOSIS — E1136 Type 2 diabetes mellitus with diabetic cataract: Secondary | ICD-10-CM | POA: Diagnosis not present

## 2021-02-14 DIAGNOSIS — R6889 Other general symptoms and signs: Secondary | ICD-10-CM | POA: Diagnosis not present

## 2021-02-14 DIAGNOSIS — C674 Malignant neoplasm of posterior wall of bladder: Secondary | ICD-10-CM | POA: Diagnosis not present

## 2021-02-14 DIAGNOSIS — M7989 Other specified soft tissue disorders: Secondary | ICD-10-CM | POA: Diagnosis present

## 2021-02-14 DIAGNOSIS — I700 Atherosclerosis of aorta: Secondary | ICD-10-CM | POA: Diagnosis not present

## 2021-02-14 DIAGNOSIS — I130 Hypertensive heart and chronic kidney disease with heart failure and stage 1 through stage 4 chronic kidney disease, or unspecified chronic kidney disease: Principal | ICD-10-CM | POA: Diagnosis present

## 2021-02-14 DIAGNOSIS — Z6841 Body Mass Index (BMI) 40.0 and over, adult: Secondary | ICD-10-CM | POA: Diagnosis not present

## 2021-02-14 DIAGNOSIS — R162 Hepatomegaly with splenomegaly, not elsewhere classified: Secondary | ICD-10-CM | POA: Diagnosis present

## 2021-02-14 DIAGNOSIS — J4489 Other specified chronic obstructive pulmonary disease: Secondary | ICD-10-CM | POA: Diagnosis present

## 2021-02-14 DIAGNOSIS — U071 COVID-19: Secondary | ICD-10-CM | POA: Diagnosis not present

## 2021-02-14 DIAGNOSIS — I5022 Chronic systolic (congestive) heart failure: Secondary | ICD-10-CM | POA: Diagnosis present

## 2021-02-14 DIAGNOSIS — R0602 Shortness of breath: Secondary | ICD-10-CM | POA: Diagnosis not present

## 2021-02-14 DIAGNOSIS — Z7189 Other specified counseling: Secondary | ICD-10-CM | POA: Diagnosis not present

## 2021-02-14 DIAGNOSIS — Z794 Long term (current) use of insulin: Secondary | ICD-10-CM

## 2021-02-14 DIAGNOSIS — G4730 Sleep apnea, unspecified: Secondary | ICD-10-CM | POA: Diagnosis present

## 2021-02-14 DIAGNOSIS — C7802 Secondary malignant neoplasm of left lung: Secondary | ICD-10-CM | POA: Diagnosis not present

## 2021-02-14 DIAGNOSIS — Z888 Allergy status to other drugs, medicaments and biological substances status: Secondary | ICD-10-CM

## 2021-02-14 DIAGNOSIS — N390 Urinary tract infection, site not specified: Secondary | ICD-10-CM | POA: Diagnosis not present

## 2021-02-14 DIAGNOSIS — R600 Localized edema: Secondary | ICD-10-CM | POA: Diagnosis not present

## 2021-02-14 DIAGNOSIS — Z8551 Personal history of malignant neoplasm of bladder: Secondary | ICD-10-CM | POA: Diagnosis not present

## 2021-02-14 DIAGNOSIS — R52 Pain, unspecified: Secondary | ICD-10-CM | POA: Diagnosis not present

## 2021-02-14 DIAGNOSIS — D631 Anemia in chronic kidney disease: Secondary | ICD-10-CM | POA: Diagnosis not present

## 2021-02-14 DIAGNOSIS — J45909 Unspecified asthma, uncomplicated: Secondary | ICD-10-CM | POA: Diagnosis present

## 2021-02-14 DIAGNOSIS — Z79899 Other long term (current) drug therapy: Secondary | ICD-10-CM

## 2021-02-14 DIAGNOSIS — Z87891 Personal history of nicotine dependence: Secondary | ICD-10-CM

## 2021-02-14 DIAGNOSIS — Z8546 Personal history of malignant neoplasm of prostate: Secondary | ICD-10-CM

## 2021-02-14 LAB — HEPATIC FUNCTION PANEL
ALT: 20 U/L (ref 0–44)
AST: 65 U/L — ABNORMAL HIGH (ref 15–41)
Albumin: 2.3 g/dL — ABNORMAL LOW (ref 3.5–5.0)
Alkaline Phosphatase: 89 U/L (ref 38–126)
Bilirubin, Direct: 0.3 mg/dL — ABNORMAL HIGH (ref 0.0–0.2)
Indirect Bilirubin: 0.6 mg/dL (ref 0.3–0.9)
Total Bilirubin: 0.9 mg/dL (ref 0.3–1.2)
Total Protein: 5.7 g/dL — ABNORMAL LOW (ref 6.5–8.1)

## 2021-02-14 LAB — BASIC METABOLIC PANEL
Anion gap: 11 (ref 5–15)
BUN: 31 mg/dL — ABNORMAL HIGH (ref 8–23)
CO2: 22 mmol/L (ref 22–32)
Calcium: 9 mg/dL (ref 8.9–10.3)
Chloride: 105 mmol/L (ref 98–111)
Creatinine, Ser: 2.03 mg/dL — ABNORMAL HIGH (ref 0.61–1.24)
GFR, Estimated: 34 mL/min — ABNORMAL LOW (ref >60)
Glucose, Bld: 105 mg/dL — ABNORMAL HIGH (ref 70–99)
Potassium: 4.8 mmol/L (ref 3.5–5.1)
Sodium: 138 mmol/L (ref 135–145)

## 2021-02-14 LAB — CBC WITH DIFFERENTIAL/PLATELET
Abs Immature Granulocytes: 0.03 10*3/uL (ref 0.00–0.07)
Basophils Absolute: 0.1 10*3/uL (ref 0.0–0.1)
Basophils Relative: 1 %
Eosinophils Absolute: 1.4 10*3/uL — ABNORMAL HIGH (ref 0.0–0.5)
Eosinophils Relative: 17 %
HCT: 34.8 % — ABNORMAL LOW (ref 39.0–52.0)
Hemoglobin: 11.3 g/dL — ABNORMAL LOW (ref 13.0–17.0)
Immature Granulocytes: 0 %
Lymphocytes Relative: 12 %
Lymphs Abs: 1 10*3/uL (ref 0.7–4.0)
MCH: 30.9 pg (ref 26.0–34.0)
MCHC: 32.5 g/dL (ref 30.0–36.0)
MCV: 95.1 fL (ref 80.0–100.0)
Monocytes Absolute: 0.9 10*3/uL (ref 0.1–1.0)
Monocytes Relative: 12 %
Neutro Abs: 4.5 10*3/uL (ref 1.7–7.7)
Neutrophils Relative %: 58 %
Platelets: 233 10*3/uL (ref 150–400)
RBC: 3.66 MIL/uL — ABNORMAL LOW (ref 4.22–5.81)
RDW: 18.6 % — ABNORMAL HIGH (ref 11.5–15.5)
WBC: 7.9 10*3/uL (ref 4.0–10.5)
nRBC: 0 % (ref 0.0–0.2)

## 2021-02-14 LAB — RESP PANEL BY RT-PCR (FLU A&B, COVID) ARPGX2
Influenza A by PCR: NEGATIVE
Influenza B by PCR: NEGATIVE
SARS Coronavirus 2 by RT PCR: NEGATIVE

## 2021-02-14 LAB — URINALYSIS, ROUTINE W REFLEX MICROSCOPIC
Bilirubin Urine: NEGATIVE
Glucose, UA: NEGATIVE mg/dL
Ketones, ur: NEGATIVE mg/dL
Nitrite: POSITIVE — AB
Protein, ur: 100 mg/dL — AB
Specific Gravity, Urine: 1.013 (ref 1.005–1.030)
pH: 9 — ABNORMAL HIGH (ref 5.0–8.0)

## 2021-02-14 LAB — CBG MONITORING, ED: Glucose-Capillary: 98 mg/dL (ref 70–99)

## 2021-02-14 LAB — BRAIN NATRIURETIC PEPTIDE: B Natriuretic Peptide: 58.1 pg/mL (ref 0.0–100.0)

## 2021-02-14 LAB — TROPONIN I (HIGH SENSITIVITY)
Troponin I (High Sensitivity): 7 ng/L (ref <18)
Troponin I (High Sensitivity): 5 ng/L (ref <18)

## 2021-02-14 MED ORDER — SODIUM CHLORIDE 0.9 % IV SOLN
1.0000 g | INTRAVENOUS | Status: DC
  Administered 2021-02-14: 1 g via INTRAVENOUS
  Filled 2021-02-14 (×2): qty 10

## 2021-02-14 MED ORDER — LEVOFLOXACIN 750 MG PO TABS
750.0000 mg | ORAL_TABLET | Freq: Once | ORAL | Status: AC
  Administered 2021-02-14: 750 mg via ORAL
  Filled 2021-02-14: qty 1

## 2021-02-14 MED ORDER — INSULIN ASPART 100 UNIT/ML ~~LOC~~ SOLN: 0.0000 [IU] | Freq: Three times a day (TID) | SUBCUTANEOUS | Status: DC

## 2021-02-14 MED ORDER — INSULIN ASPART 100 UNIT/ML ~~LOC~~ SOLN: 0.0000 [IU] | Freq: Every day | SUBCUTANEOUS | Status: DC

## 2021-02-14 MED ORDER — MORPHINE SULFATE (PF) 4 MG/ML IV SOLN
4.0000 mg | Freq: Once | INTRAVENOUS | Status: AC
  Administered 2021-02-14: 4 mg via INTRAVENOUS
  Filled 2021-02-14: qty 1

## 2021-02-14 MED ORDER — ONDANSETRON HCL 4 MG/2ML IJ SOLN: 4.0000 mg | Freq: Four times a day (QID) | INTRAMUSCULAR | Status: DC | PRN

## 2021-02-14 MED ORDER — FUROSEMIDE 10 MG/ML IJ SOLN
40.0000 mg | Freq: Once | INTRAMUSCULAR | Status: AC
  Administered 2021-02-14: 40 mg via INTRAVENOUS
  Filled 2021-02-14: qty 4

## 2021-02-14 MED ORDER — FUROSEMIDE 10 MG/ML IJ SOLN
40.0000 mg | Freq: Two times a day (BID) | INTRAMUSCULAR | Status: DC
  Administered 2021-02-15: 40 mg via INTRAVENOUS
  Filled 2021-02-14: qty 4

## 2021-02-14 MED ORDER — SODIUM CHLORIDE 0.9% FLUSH: 3.0000 mL | INTRAVENOUS | Status: DC | PRN

## 2021-02-14 MED ORDER — LORAZEPAM 2 MG/ML IJ SOLN
1.0000 mg | INTRAMUSCULAR | Status: DC | PRN
  Administered 2021-02-14 – 2021-02-15 (×3): 1 mg via INTRAVENOUS
  Filled 2021-02-14 (×3): qty 1

## 2021-02-14 MED ORDER — APIXABAN 5 MG PO TABS
5.0000 mg | ORAL_TABLET | Freq: Two times a day (BID) | ORAL | Status: DC
  Administered 2021-02-14 – 2021-02-15 (×2): 5 mg via ORAL
  Filled 2021-02-14 (×2): qty 1

## 2021-02-14 MED ORDER — SODIUM CHLORIDE 0.9 % IV SOLN: 250.0000 mL | INTRAVENOUS | Status: DC | PRN

## 2021-02-14 MED ORDER — FUROSEMIDE 10 MG/ML IJ SOLN
40.0000 mg | INTRAMUSCULAR | Status: AC
  Administered 2021-02-14: 40 mg via INTRAVENOUS
  Filled 2021-02-14: qty 4

## 2021-02-14 MED ORDER — SODIUM CHLORIDE 0.9% FLUSH
3.0000 mL | Freq: Two times a day (BID) | INTRAVENOUS | Status: DC
  Administered 2021-02-14 – 2021-02-17 (×4): 3 mL via INTRAVENOUS

## 2021-02-14 MED ORDER — ACETAMINOPHEN 325 MG PO TABS
650.0000 mg | ORAL_TABLET | ORAL | Status: DC | PRN
  Administered 2021-02-15: 650 mg via ORAL
  Filled 2021-02-14: qty 2

## 2021-02-14 NOTE — ED Provider Notes (Signed)
Patient accepted at change of shift, the patient has fairly severe edema and even anasarca.  Labs show poor synthetic function of the liver, chronic kidney disease, urinary tract infection present, antibiotics given, Lasix given, discussed with hospitalist for admission, Dr. Jonelle Sidle will admit   Jimmy Chapel, MD 02/01/2021 Einar Crow

## 2021-02-14 NOTE — Telephone Encounter (Signed)
Seth Bake form [facility] called to inform you that Mr. Crass is being transported to [facility].

## 2021-02-14 NOTE — ED Triage Notes (Signed)
Pt from home with ems for bilateral leg swelling, hx of CHF, recently doubled his lasix dose but saw no change in fluid so his PCP changed him back to 20mg  lasix once a day. DOE, on 2L [place] all the time, pt a.o, resp e.u at this time

## 2021-02-14 NOTE — H&P (Signed)
History and Physical   Jimmy [name] LNL:[id number] DOB: Oct 20, 1946 DOA: 02/17/2021  Referring MD/NP/PA: Dr. Noemi Chapel  PCP: [name], MD   Outpatient Specialists: Oncology, Dr. Alen Blew  Patient coming from: Home  Chief Complaint: Leg swelling and confusion  HPI: Jimmy [name] is a 75 y.o. male with medical history significant of coronary artery disease, diabetes, prostate cancer, morbid obesity, hypertension, bladder cancer, hyperlipidemia, male hypogonadism who has chronic lower extremity edema that has gotten worse lately.  He has been on oral Lasix at home and has been taking it at 20 mg but not helping.  His PCP increase it to 40 mg recently but still has worsening edema.  He is started having shortness of breath.  Some dysuria but no fever or chills.  Patient came to the ER where he was noted to have anasarca.  He does have stage IV bladder cancer and evidence of UTI.  There was initial thought of possible mechanical obstruction leading to lower extremity edema related to his bladder cancer.  No obvious evidence of that at the moment.  He is having exertional dyspnea.  He has progressively decreased this active life.  Patient being admitted with anasarca most likely cardiac in nature.  No recent echocardiogram..  ED Course: Temperature 98.2, blood pressure 124/49 pulse 99 respirate 29 oxygen sat 95% room air.  White count is 7.9 hemoglobin third 11.3 and platelets 233.  COVID-19 screen is negative.  Urinalysis showed large leukocyte.  Positive nitrites.  Many bacteria and WBC 21-50.  Urine culture obtained blood cultures obtained and patient being admitted for further evaluation and treatment.  Chest x-ray shows no acute findings.  CT abdomen pelvis shows stable postsurgical changes from cystectomy and diabetic urostomy right lower quadrant.  Stable bilateral pulmonary nodules consistent with static disease and cirrhosis with stable hepatosplenomegaly.  There is a 3 mm  nonobstructing left renal calculus.  Review of Systems: As per HPI otherwise 10 point review of systems negative.    Past Medical History:  Diagnosis Date  . Astigmatism of both eyes 11/01/2016  . Bladder cancer ([place])   . Bronchitis   . Coronary artery disease   . Cortical age-related cataract of both eyes 11/01/2016  . Diabetes mellitus without complication ([place])    type 2  . Drug-induced osteoporosis   . Dyslipidemia   . Fatigue   . Fatty liver   . History of kidney stones   . Hypercholesteremia   . Hypogonadism male   . 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 Normal nuclear stress test March 2011  . Nuclear sclerotic cataract of both eyes 11/01/2016  . Obesity   . Prostate cancer [facility])    s/p seed implant and radiation  . Sleep apnea    90% of time dosent wear his cpap per pt  . Vitamin D insufficiency     Past Surgical History:  Procedure Laterality Date  . BUBBLE STUDY  12/30/2019   Procedure: BUBBLE STUDY;  Surgeon: Pixie Casino, MD;  Location: [place];  Service: Cardiovascular;;  . CARDIAC CATHETERIZATION  April 2000  . IR IMAGING GUIDED PORT INSERTION  07/30/2019  . IR REMOVAL TUN ACCESS W/ PORT W/O FL MOD SED  12/26/2019  . RADIOACTIVE SEED IMPLANT  2009   Implanted radiation seeds  . TEE WITHOUT CARDIOVERSION N/A 12/30/2019   Procedure: TRANSESOPHAGEAL ECHOCARDIOGRAM (TEE);  Surgeon: Pixie Casino, MD;  Location: [facility] ENDOSCOPY;  Service: Cardiovascular;  Laterality: N/A;  . thumb surgery     as a teen caught thumb in machine  . TRANSURETHRAL RESECTION OF BLADDER TUMOR N/A 06/22/2019   Procedure: TRANSURETHRAL RESECTION OF BLADDER TUMOR (TURBT), CYSTOSCOPY,  BILATERAL RETROGRADE PYELOGRAPHY, BILATERAL STENT PLACEMENT;  Surgeon: Raynelle Bring, MD;  Location: [facility] ORS;  Service: Urology;  Laterality: N/A;  GENERAL WITH PARALYSIS     reports that he quit smoking about  21 years ago. His smoking use included cigarettes. He has a 40.00 pack-year smoking history. He has never used smokeless tobacco. He reports current alcohol use of about 1.0 standard drink of alcohol per week. He reports that he does not use drugs.  Allergies  Allergen Reactions  . Ace Inhibitors Cough  . Cephalexin Hives, Diarrhea and Nausea Only    Tolerates Ancef, Rocephin, Cefoxitin  . Crestor [Rosuvastatin Calcium]   . Lipitor [Atorvastatin Calcium] Other (See Comments)    MUSCLE PAINS   . Plavix [Clopidogrel Bisulfate] Hives  . Vancomycin Nausea And Vomiting    Po route caused n/v.     Family History  Problem Relation Age of Onset  . COPD Father      Prior to Admission medications   Medication Sig Start Date End Date Taking? Authorizing Provider  Accu-Chek Softclix Lancets lancets 1 each by Other route in the morning, at noon, and at bedtime. Use as instructed 03/24/20   [name], MD  BD PEN NEEDLE NANO 2ND GEN 32G X 4 MM MISC USE TO TEST TWICE A DAY 02/01/21   [name], MD  diclofenac Sodium (VOLTAREN) 1 % GEL Apply topically as needed.    [provider]  ELIQUIS 5 MG TABS tablet TAKE 1 TABLET BY MOUTH TWICE A DAY 09/12/20   [name], MD  ferrous sulfate 325 (65 FE) MG tablet Take 325 mg by mouth daily with breakfast.    [provider]  furosemide (LASIX) 20 MG tablet Take 1 tablet (20 mg total) by mouth daily. 02/09/21   [name], MD  glucose blood test strip 1 each by Other route in the morning, at noon, and at bedtime. Use as instructed 03/24/20   [name], MD  hydrOXYzine (ATARAX/VISTARIL) 10 MG tablet TAKE 1 TABLET BY MOUTH THREE TIMES A DAY AS NEEDED 01/16/21   Wyatt Portela, MD  insulin detemir (LEVEMIR) 100 UNIT/ML injection Inject 16 Units into the skin daily.    [provider]  Ipratropium-Albuterol (COMBIVENT RESPIMAT) 20-100 MCG/ACT AERS respimat Inhale 1 puff into the lungs in the morning, at noon, in the  evening, and at bedtime. Rinse mouth after use 11/03/20   [name], MD  metFORMIN (GLUCOPHAGE) 1000 MG tablet Take 1 tablet (1,000 mg total) by mouth daily. 12/07/20   [name], MD  metoprolol tartrate (LOPRESSOR) 25 MG tablet TAKE 1/2 TABLET BY MOUTH TWICE A DAY 09/12/20   [name], MD  Multiple Vitamin (MULTIVITAMIN WITH MINERALS) TABS tablet Take 1 tablet by mouth daily. 02/21/20   Allie Bossier, MD  pantoprazole (PROTONIX) 40 MG tablet TAKE 1 TABLET BY MOUTH TWICE A DAY Patient taking differently: Take 40 mg by mouth daily. 09/21/20   [name], MD  traMADol (ULTRAM) 50 MG tablet TAKE 1 TABLET BY MOUTH EVERY 6 HOURS AS NEEDED 02/01/21   Wyatt Portela, MD    Physical Exam: Vitals:   02/03/2021  1530 02/02/2021 1545 02/12/2021 1600 02/05/2021 1700  BP: (!) 125/49  (!) 97/57 113/66  Pulse: 88 99 97 97  Resp: 15 19 (!) 21 (!) 29  Temp:      TempSrc:      SpO2: 98% 98% 97% 98%      Constitutional: Anasarca, confused Vitals:   02/20/2021 1530 02/25/2021 1545 02/17/2021 1600 02/23/2021 1700  BP: (!) 125/49  (!) 97/57 113/66  Pulse: 88 99 97 97  Resp: 15 19 (!) 21 (!) 29  Temp:      TempSrc:      SpO2: 98% 98% 97% 98%   Eyes: PERRL, lids and conjunctivae normal ENMT: Mucous membranes are moist. Posterior pharynx clear of any exudate or lesions.Normal dentition.  Neck: normal, supple, no masses, no thyromegaly Respiratory: Decreased air entry bilaterally with crackles and wheeze, normal respiratory effort. No accessory muscle use.  Cardiovascular: Irregularly irregular rate and rhythm, no murmurs / rubs / gallops.  3+ extremity edema. 2+ pedal pulses. No carotid bruits.  Abdomen: no tenderness, no masses palpated. No hepatosplenomegaly. Bowel sounds positive.  Musculoskeletal: no clubbing / cyanosis. No joint deformity upper and lower extremities. Good ROM, no contractures. Normal muscle tone.  Skin: no rashes, lesions, ulcers. No induration Neurologic: CN 2-12 grossly  intact. Sensation intact, DTR normal. Strength 5/5 in all 4.  Psychiatric: Confused, no distress    Labs on Admission: I have personally reviewed following labs and imaging studies  CBC: Recent Labs  Lab 02/01/2021 1424  WBC 7.9  NEUTROABS 4.5  HGB 11.3*  HCT 34.8*  MCV 95.1  PLT 272   Basic Metabolic Panel: Recent Labs  Lab 02/05/2021 1424  NA 138  K 4.8  CL 105  CO2 22  GLUCOSE 105*  BUN 31*  CREATININE 2.03*  CALCIUM 9.0   GFR: CrCl cannot be calculated (Unknown ideal weight.). Liver Function Tests: Recent Labs  Lab 02/12/2021 1424  AST 65*  ALT 20  ALKPHOS 89  BILITOT 0.9  PROT 5.7*  ALBUMIN 2.3*   No results for input(s): LIPASE, AMYLASE in the last 168 hours. No results for input(s): AMMONIA in the last 168 hours. Coagulation Profile: No results for input(s): INR, PROTIME in the last 168 hours. Cardiac Enzymes: No results for input(s): CKTOTAL, CKMB, CKMBINDEX, TROPONINI in the last 168 hours. BNP (last 3 results) No results for input(s): PROBNP in the last 8760 hours. HbA1C: No results for input(s): HGBA1C in the last 72 hours. CBG: No results for input(s): GLUCAP in the last 168 hours. Lipid Profile: No results for input(s): CHOL, HDL, LDLCALC, TRIG, CHOLHDL, LDLDIRECT in the last 72 hours. Thyroid Function Tests: No results for input(s): TSH, T4TOTAL, FREET4, T3FREE, THYROIDAB in the last 72 hours. Anemia Panel: No results for input(s): VITAMINB12, FOLATE, FERRITIN, TIBC, IRON, RETICCTPCT in the last 72 hours. Urine analysis:    Component Value Date/Time   COLORURINE AMBER (A) 02/27/2021 1606   APPEARANCEUR CLOUDY (A) 02/16/2021 1606   LABSPEC 1.013 02/23/2021 1606   PHURINE 9.0 (H) 02/23/2021 1606   GLUCOSEU NEGATIVE 02/04/2021 1606   HGBUR MODERATE (A) 02/20/2021 [place] 02/06/2021 1606   BILIRUBINUR n 06/08/2016 1109   KETONESUR NEGATIVE 02/19/2021 1606   PROTEINUR 100 (A) 02/01/2021 1606   UROBILINOGEN negative  06/08/2016 1109   NITRITE POSITIVE (A) 02/12/2021 1606   LEUKOCYTESUR LARGE (A) 02/17/2021 1606   Sepsis Labs: @LABRCNTIP (procalcitonin:4,lacticidven:4) ) Recent Results (from the past 240 hour(s))  Resp Panel by RT-PCR (Flu A&B,  Covid) Nasopharyngeal Swab     Status: None   Collection Time: 02/16/2021  3:52 PM   Specimen: Nasopharyngeal Swab; Nasopharyngeal(NP) swabs in vial transport medium  Result Value Ref Range Status   SARS Coronavirus 2 by RT PCR NEGATIVE NEGATIVE Final    Comment: (NOTE) SARS-CoV-2 target nucleic acids are NOT DETECTED.  The SARS-CoV-2 RNA is generally detectable in upper respiratory specimens during the acute phase of infection. The lowest concentration of SARS-CoV-2 viral copies this assay can detect is 138 copies/mL. A negative result does not preclude SARS-Cov-2 infection and should not be used as the sole basis for treatment or other patient management decisions. A negative result may occur with  improper specimen collection/handling, submission of specimen other than nasopharyngeal swab, presence of viral mutation(s) within the areas targeted by this assay, and inadequate number of viral copies(<138 copies/mL). A negative result must be combined with clinical observations, patient history, and epidemiological information. The expected result is Negative.  Fact Sheet for Patients:  EntrepreneurPulse.com.au  Fact Sheet for Healthcare Providers:  IncredibleEmployment.be  This test is no t yet approved or cleared by the [place] FDA and  has been authorized for detection and/or diagnosis of SARS-CoV-2 by FDA under an Emergency Use Authorization (EUA). This EUA will remain  in effect (meaning this test can be used) for the duration of the COVID-19 declaration under Section 564(b)(1) of the Act, 21 U.S.C.section 360bbb-3(b)(1), unless the authorization is terminated  or revoked sooner.       Influenza A by PCR  NEGATIVE NEGATIVE Final   Influenza B by PCR NEGATIVE NEGATIVE Final    Comment: (NOTE) The Xpert Xpress SARS-CoV-2/FLU/RSV plus assay is intended as an aid in the diagnosis of influenza from Nasopharyngeal swab specimens and should not be used as a sole basis for treatment. Nasal washings and aspirates are unacceptable for Xpert Xpress SARS-CoV-2/FLU/RSV testing.  Fact Sheet for Patients: EntrepreneurPulse.com.au  Fact Sheet for Healthcare Providers: IncredibleEmployment.be  This test is not yet approved or cleared by the [place] FDA and has been authorized for detection and/or diagnosis of SARS-CoV-2 by FDA under an Emergency Use Authorization (EUA). This EUA will remain in effect (meaning this test can be used) for the duration of the COVID-19 declaration under Section 564(b)(1) of the Act, 21 U.S.C. section 360bbb-3(b)(1), unless the authorization is terminated or revoked.  Performed at [facility], [place] [address] [place], [place] [address]      Radiological Exams on Admission: CT ABDOMEN PELVIS WO CONTRAST  Result Date: 02/01/2021 CLINICAL DATA:  Lower extremity edema, history of bladder cancer EXAM: CT ABDOMEN AND PELVIS WITHOUT CONTRAST TECHNIQUE: Multidetector CT imaging of the abdomen and pelvis was performed following the standard protocol without IV contrast. COMPARISON:  01/18/2021 FINDINGS: Lower chest: Bibasilar pulmonary nodules are identified. Right lower lobe pleural base nodule measures 19 x 11 mm image 5/3, previously measuring 17 x 11 mm. Remaining visualized nodules are grossly stable. Stable scarring and subpleural fibrosis. No acute airspace disease. Unenhanced CT was performed per clinician order. Lack of IV contrast limits sensitivity and specificity, especially for evaluation of abdominal/pelvic solid viscera. Hepatobiliary: Stable hepatomegaly with nodularity of the hepatic capsule consistent with cirrhosis. No  gross abnormalities of the liver parenchyma on this unenhanced exam. No intrahepatic duct dilation. The gallbladder is unremarkable. Pancreas: Unremarkable. No pancreatic ductal dilatation or surrounding inflammatory changes. Spleen: Stable splenomegaly. No gross abnormalities on this unenhanced exam. Adrenals/Urinary Tract: 3 mm nonobstructing left renal calculus. No obstructive  uropathy. Postsurgical changes from cystectomy and diverting urostomy right lower quadrant. The adrenals are unremarkable. Stomach/Bowel: No bowel obstruction or ileus. No bowel wall thickening or inflammatory change. Vascular/Lymphatic: Stable atherosclerosis of the aorta and its branches. No pathologic adenopathy. Reproductive: Fiduciary markers are seen within the prostate, which is not enlarged. Other: There is no free fluid or free gas. Stable fat stranding in the lower pelvis likely related to prior therapy. Musculoskeletal: There are no acute or destructive bony lesions. Prominent subcutaneous edema within the bilateral flanks and lower extremities. Reconstructed images demonstrate no additional findings. IMPRESSION: 1. Stable postsurgical changes from cystectomy and diverting urostomy right lower quadrant. 2. Stable bilateral pulmonary nodules consistent with metastatic disease. 3. Cirrhosis, with stable hepatosplenomegaly. 4. A 3 mm nonobstructing left renal calculus. 5. Prominent subcutaneous edema within the bilateral flanks and lower extremities. 6. Aortic Atherosclerosis (ICD10-I70.0). Electronically Signed   By: Randa Ngo M.D.   On: 02/21/2021 17:06   DG Chest Port 1 View  Result Date: 02/13/2021 CLINICAL DATA:  Shortness of breath EXAM: PORTABLE CHEST 1 VIEW COMPARISON:  07/31/2020 FINDINGS: Diffuse interstitial prominence throughout the lungs, likely chronic interstitial lung disease/fibrosis. Heart is borderline enlarged. No acute confluent opacities or effusions. No acute bony abnormality. IMPRESSION: Severe  chronic lung disease.  No definite acute process. Electronically Signed   By: Rolm Baptise M.D.   On: 02/24/2021 14:52      Assessment/Plan Principal Problem:   Anasarca Active Problems:   Coronary artery disease involving native coronary artery of native heart without angina pectoris   Morbid obesity (H[facility])   Hyperlipidemia associated with type 2 diabetes mellitus ([facility])   Bladder cancer ([place])   Asthma with irreversible airway obstruction ([facility])   Dependent edema   Acute lower UTI     #1 anasarca: Most likely cardiac.  Not likely renal.  We will aggressively diurese patient while watching renal function.  Continue to monitor.  #2 metastatic bladder and prostate cancer: Patient will continue follow-up with oncology.  Continue monitoring.  #3 UTI: Cultures obtained both urine and blood.  Continue IV antibiotics empirically.  #4 asthma: No exacerbation.  Continue treatment  #5 coronary artery disease: Stable.  Continue treatment  #6 morbid obesity: Stable  #7 hyperlipidemia: Continue to monitor  #8 diabetes: Initiate sliding scale insulin.  DVT prophylaxis: Eliquis Code Status: Full code Family Communication: No family at bedside Disposition Plan: Home Consults called: None Admission status: Inpatient  Severity of Illness: The appropriate patient status for this patient is INPATIENT. Inpatient status is judged to be reasonable and necessary in order to provide the required intensity of service to ensure the patient's safety. The patient's presenting symptoms, physical exam findings, and initial radiographic and laboratory data in the context of their chronic comorbidities is felt to place them at high risk for further clinical deterioration. Furthermore, it is not anticipated that the patient will be medically stable for discharge from the hospital within 2 midnights of admission. The following factors support the patient status of inpatient.   " The patient's presenting  symptoms include lower extremity edema. " The worrisome physical exam findings include anasarca. " The initial radiographic and laboratory data are worrisome because of pulmonary edema. " The chronic co-morbidities include diabetes.   * I certify that at the point of admission it is my clinical judgment that the patient will require inpatient hospital care spanning beyond 2 midnights from the point of admission due to high intensity of service, high risk for further deterioration  and high frequency of surveillance required.Barbette Merino MD [facility] Pager [phone]  If 7PM-7AM, please contact night-coverage [email or web address] Password [facility]  02/26/2021, 9:37 PM

## 2021-02-14 NOTE — Telephone Encounter (Signed)
Received call from home health nurse that patient has been transferred and admitted at the [facility] for fluid overload.

## 2021-02-14 NOTE — ED Notes (Signed)
Pt provided sandwich bag with drink per MD.

## 2021-02-14 NOTE — Telephone Encounter (Signed)
TCT patient and spoke with his wife [name]. Informed her that Dr. Alen Blew is ok to increase Tramadol from 1 tablet to 2 tablets and that he has no solution to his edema and would agree with ED evaluation. Mrs. Rafalski stated that the home health nurse will be coming in to do an evaluation as ordered by his PCP and she would have nurse send a copy of findings to Dr. Hazeline Junker office.

## 2021-02-14 NOTE — ED Provider Notes (Signed)
[facility] EMERGENCY DEPARTMENT Provider Note   CSN: [id number] Arrival date & time: 02/08/2021  1414     History Chief Complaint  Patient presents with  . Leg Swelling    Jimmy [name] is a 75 y.o. male.  Pt presents to the ED today with leg swelling.  Pt has a hx of peripheral edema that his pcp has been trying to manage as an outpatient.  His pcp had patient increase his lasix from 20 mg daily to 40 mg.  There was a concern that he has a mechanical obstruction from pt's known stage IV bladder cancer.  Home health has been working on compression hose, but swelling is worsening.  Pt does have sob when he moves around, but he does not move much.              Past Medical History:  Diagnosis Date  . Astigmatism of both eyes 11/01/2016  . Bladder cancer ([place])   . Bronchitis   . Coronary artery disease   . Cortical age-related cataract of both eyes 11/01/2016  . Diabetes mellitus without complication ([place])    type 2  . Drug-induced osteoporosis   . Dyslipidemia   . Fatigue   . Fatty liver   . History of kidney stones   . Hypercholesteremia   . Hypogonadism male   . 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 Normal nuclear stress test March 2011  . Nuclear sclerotic cataract of both eyes 11/01/2016  . Obesity   . Prostate cancer [facility])    s/p seed implant and radiation  . Sleep apnea    90% of time dosent wear his cpap per pt  . Vitamin D insufficiency     Patient Active Problem List   Diagnosis Date Noted  . Statin intolerance 02/09/2021  . Dependent edema 02/09/2021  . Pressure injury of skin 07/31/2020  . Pulmonary emphysema ([place]) 05/11/2020  . Asthma with irreversible airway obstruction ([place]) 03/17/2020  . Bladder cancer ([place]) 12/10/2019  . Port-A-Cath in place 08/07/2019  . Goals of care, counseling/discussion 07/24/2019  . Malignant neoplasm of  urinary bladder ([place]) 07/24/2019  . Senile purpura ([place]) 06/03/2019  . Type II diabetes mellitus with complication ([place]) 56/43/3295  . Osteoporosis due to androgen therapy 09/16/2012  . Coronary artery disease involving native coronary artery of native heart without angina pectoris 08/29/2011  . Morbid obesity ([place]) 08/29/2011  . History of prostate cancer 08/29/2011  . Sleep apnea 08/29/2011  . Hypertension associated with type 2 diabetes mellitus ([place]) 08/29/2011  . Hyperlipidemia associated with type 2 diabetes mellitus ([place]) 08/29/2011    Past Surgical History:  Procedure Laterality Date  . BUBBLE STUDY  12/30/2019   Procedure: BUBBLE STUDY;  Surgeon: Pixie Casino, MD;  Location: [facility];  Service: Cardiovascular;;  . CARDIAC CATHETERIZATION  April 2000  . IR IMAGING GUIDED PORT INSERTION  07/30/2019  . IR REMOVAL TUN ACCESS W/ PORT W/O FL MOD SED  12/26/2019  . RADIOACTIVE SEED IMPLANT  2009   Implanted radiation seeds  . TEE WITHOUT CARDIOVERSION N/A 12/30/2019   Procedure: TRANSESOPHAGEAL ECHOCARDIOGRAM (TEE);  Surgeon: Pixie Casino, MD;  Location: [facility] ENDOSCOPY;  Service: Cardiovascular;  Laterality: N/A;  . thumb surgery     as a teen caught thumb in machine  . TRANSURETHRAL RESECTION OF BLADDER TUMOR N/A 06/22/2019   Procedure: TRANSURETHRAL  RESECTION OF BLADDER TUMOR (TURBT), CYSTOSCOPY,  BILATERAL RETROGRADE PYELOGRAPHY, BILATERAL STENT PLACEMENT;  Surgeon: Raynelle Bring, MD;  Location: [facility] ORS;  Service: Urology;  Laterality: N/A;  GENERAL WITH PARALYSIS       Family History  Problem Relation Age of Onset  . COPD Father     Social History   Tobacco Use  . Smoking status: Former Smoker    Packs/day: 1.00    Years: 40.00    Pack years: 40.00    Types: Cigarettes    Quit date: 08/20/1999    Years since quitting: 21.5  . Smokeless tobacco: Never Used  Vaping Use  . Vaping Use: Never used  Substance Use Topics  . Alcohol use: Yes    Alcohol/week:  1.0 standard drink    Types: 1 Shots of liquor per week    Comment: occasional  . Drug use: No    Home Medications Prior to Admission medications   Medication Sig Start Date End Date Taking? Authorizing Provider  Accu-Chek Softclix Lancets lancets 1 each by Other route in the morning, at noon, and at bedtime. Use as instructed 03/24/20   [name], MD  BD PEN NEEDLE NANO 2ND GEN 32G X 4 MM MISC USE TO TEST TWICE A DAY 02/01/21   [name], MD  diclofenac Sodium (VOLTAREN) 1 % GEL Apply topically as needed.    [provider]  ELIQUIS 5 MG TABS tablet TAKE 1 TABLET BY MOUTH TWICE A DAY 09/12/20   [name], MD  ferrous sulfate 325 (65 FE) MG tablet Take 325 mg by mouth daily with breakfast.    [provider]  furosemide (LASIX) 20 MG tablet Take 1 tablet (20 mg total) by mouth daily. 02/09/21   [name], MD  glucose blood test strip 1 each by Other route in the morning, at noon, and at bedtime. Use as instructed 03/24/20   [name], MD  hydrOXYzine (ATARAX/VISTARIL) 10 MG tablet TAKE 1 TABLET BY MOUTH THREE TIMES A DAY AS NEEDED 01/16/21   Wyatt Portela, MD  insulin detemir (LEVEMIR) 100 UNIT/ML injection Inject 16 Units into the skin daily.    [provider]  Ipratropium-Albuterol (COMBIVENT RESPIMAT) 20-100 MCG/ACT AERS respimat Inhale 1 puff into the lungs in the morning, at noon, in the evening, and at bedtime. Rinse mouth after use 11/03/20   [name], MD  metFORMIN (GLUCOPHAGE) 1000 MG tablet Take 1 tablet (1,000 mg total) by mouth daily. 12/07/20   [name], MD  metoprolol tartrate (LOPRESSOR) 25 MG tablet TAKE 1/2 TABLET BY MOUTH TWICE A DAY 09/12/20   [name], MD  Multiple Vitamin (MULTIVITAMIN WITH MINERALS) TABS tablet Take 1 tablet by mouth daily. 02/21/20   Allie Bossier, MD  pantoprazole (PROTONIX) 40 MG tablet TAKE 1 TABLET BY MOUTH TWICE A DAY Patient taking differently: Take 40 mg by mouth daily.  09/21/20   [name], MD  traMADol (ULTRAM) 50 MG tablet TAKE 1 TABLET BY MOUTH EVERY 6 HOURS AS NEEDED 02/01/21   Wyatt Portela, MD    Allergies    Ace inhibitors, Cephalexin, Crestor [rosuvastatin calcium], Lipitor [atorvastatin calcium], Plavix [clopidogrel bisulfate], and Vancomycin  Review of Systems   Review of Systems  Cardiovascular: Positive for leg swelling.  All other systems reviewed and are negative.   Physical Exam Updated Vital Signs BP (!) 125/49   Pulse 88   Temp 98.2 F (36.8 C) (Oral)  Resp 15   SpO2 98%   Physical Exam Vitals and nursing note reviewed.  Constitutional:      Appearance: Normal appearance.  HENT:     Head: Normocephalic and atraumatic.     Right Ear: External ear normal.     Left Ear: External ear normal.     Nose: Nose normal.     Mouth/Throat:     Mouth: Mucous membranes are moist.     Pharynx: Oropharynx is clear.  Eyes:     Extraocular Movements: Extraocular movements intact.     Conjunctiva/sclera: Conjunctivae normal.     Pupils: Pupils are equal, round, and reactive to light.  Cardiovascular:     Rate and Rhythm: Normal rate and regular rhythm.     Pulses: Normal pulses.     Heart sounds: Normal heart sounds.  Pulmonary:     Effort: Pulmonary effort is normal.     Breath sounds: Normal breath sounds.  Abdominal:     General: Abdomen is flat. Bowel sounds are normal.     Palpations: Abdomen is soft.     Comments: Urostomy noted  Genitourinary:    Comments: Significant scrotal and penile swelling Musculoskeletal:     Cervical back: Normal range of motion and neck supple.     Right lower leg: Edema present.     Left lower leg: Edema present.  Skin:    General: Skin is warm.     Capillary Refill: Capillary refill takes less than 2 seconds.  Neurological:     General: No focal deficit present.     Mental Status: He is alert and oriented to person, place, and time.     ED Results / Procedures / Treatments    Labs (all labs ordered are listed, but only abnormal results are displayed) Labs Reviewed  CBC WITH DIFFERENTIAL/PLATELET - Abnormal; Notable for the following components:      Result Value   RBC 3.66 (*)    Hemoglobin 11.3 (*)    HCT 34.8 (*)    RDW 18.6 (*)    Eosinophils Absolute 1.4 (*)    All other components within normal limits  RESP PANEL BY RT-PCR (FLU A&B, COVID) ARPGX2  BASIC METABOLIC PANEL  BRAIN NATRIURETIC PEPTIDE  URINALYSIS, ROUTINE W REFLEX MICROSCOPIC  HEPATIC FUNCTION PANEL  TROPONIN I (HIGH SENSITIVITY)    EKG EKG Interpretation  Date/Time:  Tuesday February 14 2021 14:31:50 EST Ventricular Rate:  90 PR Interval:    QRS Duration: 108 QT Interval:  378 QTC Calculation: 463 R Axis:   7 Text Interpretation: Sinus rhythm Low voltage, extremity and precordial leads Confirmed by Isla Pence [phone]) on 02/27/2021 3:13:34 PM   Radiology DG Chest Port 1 View  Result Date: 02/24/2021 CLINICAL DATA:  Shortness of breath EXAM: PORTABLE CHEST 1 VIEW COMPARISON:  07/31/2020 FINDINGS: Diffuse interstitial prominence throughout the lungs, likely chronic interstitial lung disease/fibrosis. Heart is borderline enlarged. No acute confluent opacities or effusions. No acute bony abnormality. IMPRESSION: Severe chronic lung disease.  No definite acute process. Electronically Signed   By: Rolm Baptise M.D.   On: 02/17/2021 14:52    Procedures Procedures   Medications Ordered in ED Medications  furosemide (LASIX) injection 40 mg (has no administration in time range)    ED Course  I have reviewed the triage vital signs and the nursing notes.  Pertinent labs & imaging results that were available during my care of the patient were reviewed by me and considered in my medical decision  making (see chart for details).    MDM Rules/Calculators/A&P                          Pt's CXR shows nothing acute and O2 sats are good on pt's normal 2L.   I ordered a CT  abd/pelvis to make sure there is no obstruction.  Pt signed out to Dr. Sabra Heck at shift change.   Final Clinical Impression(s) / ED Diagnoses Final diagnoses:  Peripheral edema  Stage IV bladder cancer [facility])    Rx / DC Orders ED Discharge Orders    None       Isla Pence, MD 02/20/2021 1537

## 2021-02-14 NOTE — ED Notes (Signed)
Patient transported to CT 

## 2021-02-14 NOTE — Telephone Encounter (Signed)
Noted  

## 2021-02-15 ENCOUNTER — Inpatient Hospital Stay (HOSPITAL_COMMUNITY): Payer: Medicare PPO

## 2021-02-15 DIAGNOSIS — I5023 Acute on chronic systolic (congestive) heart failure: Secondary | ICD-10-CM | POA: Diagnosis not present

## 2021-02-15 DIAGNOSIS — R601 Generalized edema: Secondary | ICD-10-CM | POA: Diagnosis not present

## 2021-02-15 LAB — BASIC METABOLIC PANEL
Anion gap: 15 (ref 5–15)
BUN: 34 mg/dL — ABNORMAL HIGH (ref 8–23)
CO2: 19 mmol/L — ABNORMAL LOW (ref 22–32)
Calcium: 8.9 mg/dL (ref 8.9–10.3)
Chloride: 104 mmol/L (ref 98–111)
Creatinine, Ser: 2.2 mg/dL — ABNORMAL HIGH (ref 0.61–1.24)
GFR, Estimated: 31 mL/min — ABNORMAL LOW (ref >60)
Glucose, Bld: 109 mg/dL — ABNORMAL HIGH (ref 70–99)
Potassium: 4.6 mmol/L (ref 3.5–5.1)
Sodium: 138 mmol/L (ref 135–145)

## 2021-02-15 LAB — GLUCOSE, CAPILLARY: Glucose-Capillary: 101 mg/dL — ABNORMAL HIGH (ref 70–99)

## 2021-02-15 LAB — ECHOCARDIOGRAM COMPLETE
S' Lateral: 3.3 cm
Weight: 4194.03 oz

## 2021-02-15 LAB — CBG MONITORING, ED: Glucose-Capillary: 103 mg/dL — ABNORMAL HIGH (ref 70–99)

## 2021-02-15 MED ORDER — PROCHLORPERAZINE MALEATE 10 MG PO TABS
10.0000 mg | ORAL_TABLET | Freq: Four times a day (QID) | ORAL | Status: DC | PRN
  Filled 2021-02-15: qty 1

## 2021-02-15 MED ORDER — PROCHLORPERAZINE 25 MG RE SUPP
25.0000 mg | Freq: Two times a day (BID) | RECTAL | Status: DC | PRN
  Filled 2021-02-15: qty 1

## 2021-02-15 MED ORDER — GLYCOPYRROLATE 1 MG PO TABS
1.0000 mg | ORAL_TABLET | ORAL | Status: DC | PRN
  Filled 2021-02-15: qty 1

## 2021-02-15 MED ORDER — MORPHINE SULFATE (PF) 2 MG/ML IV SOLN
1.0000 mg | INTRAVENOUS | Status: DC | PRN
  Administered 2021-02-15 – 2021-02-16 (×7): 1 mg via INTRAVENOUS
  Filled 2021-02-15 (×8): qty 1

## 2021-02-15 MED ORDER — ACETAMINOPHEN 325 MG PO TABS: 650.0000 mg | ORAL_TABLET | Freq: Four times a day (QID) | ORAL | Status: DC | PRN

## 2021-02-15 MED ORDER — GLYCOPYRROLATE 0.2 MG/ML IJ SOLN
0.2000 mg | INTRAMUSCULAR | Status: DC | PRN
  Administered 2021-02-16 (×2): 0.2 mg via INTRAVENOUS
  Filled 2021-02-15: qty 1

## 2021-02-15 MED ORDER — DIPHENHYDRAMINE HCL 50 MG/ML IJ SOLN: 12.5000 mg | INTRAMUSCULAR | Status: DC | PRN

## 2021-02-15 MED ORDER — LORAZEPAM 2 MG/ML IJ SOLN
1.0000 mg | INTRAMUSCULAR | Status: DC | PRN
  Administered 2021-02-16: 1 mg via INTRAVENOUS

## 2021-02-15 MED ORDER — HALOPERIDOL LACTATE 5 MG/ML IJ SOLN
0.5000 mg | INTRAMUSCULAR | Status: DC | PRN
  Administered 2021-02-15: 0.5 mg via INTRAVENOUS
  Filled 2021-02-15: qty 1

## 2021-02-15 MED ORDER — POLYVINYL ALCOHOL 1.4 % OP SOLN
1.0000 [drp] | Freq: Four times a day (QID) | OPHTHALMIC | Status: DC | PRN
  Filled 2021-02-15: qty 15

## 2021-02-15 MED ORDER — HALOPERIDOL LACTATE 2 MG/ML PO CONC: 0.5000 mg | ORAL | Status: DC | PRN

## 2021-02-15 MED ORDER — LORAZEPAM 2 MG/ML PO CONC: 1.0000 mg | ORAL | Status: DC | PRN

## 2021-02-15 MED ORDER — GLYCOPYRROLATE 0.2 MG/ML IJ SOLN
0.2000 mg | INTRAMUSCULAR | Status: DC | PRN
  Filled 2021-02-15: qty 1

## 2021-02-15 MED ORDER — ACETAMINOPHEN 650 MG RE SUPP: 650.0000 mg | Freq: Four times a day (QID) | RECTAL | Status: DC | PRN

## 2021-02-15 MED ORDER — LORAZEPAM 1 MG PO TABS: 1.0000 mg | ORAL_TABLET | ORAL | Status: DC | PRN

## 2021-02-15 MED ORDER — HALOPERIDOL 0.5 MG PO TABS
0.5000 mg | ORAL_TABLET | ORAL | Status: DC | PRN
  Filled 2021-02-15: qty 1

## 2021-02-15 MED ORDER — MORPHINE SULFATE (PF) 2 MG/ML IV SOLN
INTRAVENOUS | Status: AC
  Administered 2021-02-15: 1 mg via INTRAVENOUS
  Filled 2021-02-15: qty 1

## 2021-02-15 MED ORDER — BISACODYL 10 MG RE SUPP
10.0000 mg | Freq: Every day | RECTAL | Status: DC | PRN
Start: 2021-02-15 — End: 2021-02-18

## 2021-02-15 MED ORDER — PROCHLORPERAZINE EDISYLATE 10 MG/2ML IJ SOLN
10.0000 mg | Freq: Two times a day (BID) | INTRAMUSCULAR | Status: DC | PRN
  Filled 2021-02-15: qty 2

## 2021-02-15 NOTE — Consult Note (Signed)
Door Nurse ostomy consult note Stoma type/location: RLQ ileal conduit  Stomal assessment/size: 1"   Pink and moist Peristomal assessment: pouch intact Treatment options for stomal/peristomal skin: barrier ring and 1 piece convex pouch Output cloudy yellow urine noted in pouch.   Ostomy pouching: 1pc.convex pouch ([facility] # [id number]) with barrier ring left at bedside.  Pouch is currently intact Education provided: none  Patient confused.  Enrolled patient in [place] program: no  WOC Nurse Consult Note: Reason for Consult: Anasarca with increasing edema to lower legs.  HH has been compressing with little improvement. Lasix is increased and will recommend modified light compression.  Moisturize legs with eucerin cream and apply kerlix and ace wraps.  Reapply daily.  Will not follow at this time.  Please re-consult if needed.  Domenic Moras MSN, RN, FNP-BC CWON Wound, Ostomy, Continence Nurse Pager [phone]

## 2021-02-15 NOTE — Progress Notes (Signed)
  Echocardiogram 2D Echocardiogram has been performed.  Jimmy [name] 02/15/2021, 11:05 AM

## 2021-02-15 NOTE — ED Notes (Signed)
Tele Breakfast order placed 

## 2021-02-15 NOTE — Progress Notes (Signed)
PROGRESS NOTE  Jimmy [name] CBU:[id number] DOB: [birth year] DOA: 02/23/2021 PCP: [name], MD  Brief History   The patient is a 75 yr old man who presented to [facility] on 02/13/2021 with complaints of increasing shortness of breath, confusion, and severe lower extremity and scrotal/penile edema. The patient's PCP had recently increased his lasix to 40 mg daily without improvement. CT of the abdomen and pelvis reveals no lymphatic or venous obstruction which could cause this severe edema. There is a 3 mm non-obstructing left renal calculus. CXR shows no acute findings. Echocardiogram demonstrates an EF of 60% and indeterminate diastolic parameters. No wall motion abnormalities.   Urinalysis is positive for UTI.  The patient carries a past medical history significant for CAD, DM II, prostate and bladder CA, morbid obesity, hypertension, hyperlipdemia, and male hypogonadism.  Per the patient's wife he has severe all over pain which is worsening.   Consultants  . Palliative care  Procedures  . None  Antibiotics   Anti-infectives (From admission, onward)   Start     Dose/Rate Route Frequency Ordered Stop   02/03/2021 2315  cefTRIAXone (ROCEPHIN) 1 g in sodium chloride 0.9 % 100 mL IVPB  Status:  Discontinued        1 g 200 mL/hr over 30 Minutes Intravenous Every 24 hours 02/17/2021 2310 02/15/21 1447   02/26/2021 1745  levofloxacin (LEVAQUIN) tablet 750 mg        750 mg Oral  Once 02/13/2021 1744 02/27/2021 1816    .  Subjective  The patient is resting in bed. He is very HOH. He is confused. His wife is at bedside. She states that she knows he is dying. The patient does state that he hurts all over.   Objective   Vitals:  Vitals:   02/15/21 1140 02/15/21 1200  BP: 123/68 (!) 142/58  Pulse: (!) 112 (!) 112  Resp: 13 (!) 23  Temp: 97.6 F (36.4 C) 98 F (36.7 C)  SpO2: 94% 94%   Exam:  Constitutional:  . The patient is awake and alert. He is confused and in mild distress from  generalized pain. Respiratory:  . No increased work of breathing. . No wheezes, rales, or rhonchi . No tactile fremitus . Lung sounds are distant. Cardiovascular:  . Regular rate and rhythm, rate is fast. . No murmurs, ectopy, or gallups. . No lateral PMI. No thrills. Abdomen:  . Abdomen is soft, non-tender, non-distended . Abdomen is morbidly obese. . No hernias, masses, or organomegaly can be appreciated due to body habitus. . Bowel sounds are distant.  Musculoskeletal:  . No cyanosis or clubbing . 4+ pitting edema bilaterally. . Left lower extremity is warm to touch and erythematous.  . Proximal right lower extremity is slightly erythematous, but not warm. Genitourinary: Penis and scrotum are hugely swollen with blistering, weeping and erythema. Skin:  . No rashes, lesions, ulcers . palpation of skin: no induration or nodules . Erythema, warmth, and blistering as described above. Neurologic:  . Pt is moving all extremities. Psychiatric:  . Pt is confused. Per his wife he has been having visual hallucinations.   I have personally reviewed the following:   Today's Data  . Vitals, CBC, UA  Micro Data  . Urine culture: No growth  Imaging  . CXR . CT abdomen and pelvis  Cardiology Data  . EKG . Echocardiogram  Scheduled Meds: . sodium chloride flush  3 mL Intravenous Q12H   Continuous Infusions: . sodium chloride  Principal Problem:   Anasarca Active Problems:   Coronary artery disease involving native coronary artery of native heart without angina pectoris   Morbid obesity (H[facility])   Hyperlipidemia associated with type 2 diabetes mellitus ([place])   Bladder cancer ([place])   Asthma with irreversible airway obstruction ([facility])   Dependent edema   Acute lower UTI   LOS: 1 day   A & P  Anasarca: Etiology unclear. Echo demonstrated EF of 60%. Creatinine is elevated at 2.20.  Albumin is low at 2.3, but could be worse. No apparent lymphatic or venous obstruction.  Continue efforts to diuresis.   Metastatic bladder and prostate cancer: Patient will continue follow-up with oncology.  Continue monitoring.  UTI: Cultures obtained both urine and blood.  Continue IV antibiotics empirically.  Asthma: No exacerbation.  Continue treatment  Coronary artery disease: Stable. No wall motion abnormalities on echocardiogram.  Continue treatment  Morbid obesity: Stable. Complicates all cares.  Hyperlipidemia: Continue to monitor  Diabetes: Under fair control with HbA1c of 6.1. The patient is on Levemir 16 units daily and metformin at home. Here his glucoses are followed with FSBS and SSI.  I have seen and examined this patient myself. I have spent 35 minutes in his evaluation and care.  DVT prophylaxis: Eliquis Code Status: Full code Family Communication: Discussed patient at length with his spouse who is at bedside. Disposition Plan: Home  Daren Yeagle, DO [facility] Direct contact: see [email or web address]  7PM-7AM contact night coverage as above 02/15/2021, 3:52 PM  LOS: 1 day   ADDENDUM: About an hour after seeing this patient I received a communication from nursing that the patient had become very agitated. She felt that this was likely in response to poor pain control. The patient was given 1 mg morphine. After a few minutes I received another communication asking me to come and evaluate the patient. He had become very agitated and combative. He had removed his oxygen. The patient's wife was at bedside and she again stated that she knew he was dying and just wanted him to be comfortably. I discussed with her comfort care. I told her what we would do and what we would not do for the patient. I told her that anything not related to comfort would be discontinued. I told her that he would receive medication to keep him calm and pain free. I told her that the patient would likely pass. She voiced understanding. Palliative care had already been consulted as  per out conversation on my first visit.

## 2021-02-16 ENCOUNTER — Telehealth: Payer: Self-pay | Admitting: Oncology

## 2021-02-16 DIAGNOSIS — Z515 Encounter for palliative care: Secondary | ICD-10-CM

## 2021-02-16 DIAGNOSIS — Z7189 Other specified counseling: Secondary | ICD-10-CM | POA: Diagnosis not present

## 2021-02-16 DIAGNOSIS — Z66 Do not resuscitate: Secondary | ICD-10-CM

## 2021-02-16 DIAGNOSIS — R601 Generalized edema: Secondary | ICD-10-CM | POA: Diagnosis not present

## 2021-02-16 MED ORDER — MORPHINE 100MG IN NS 100ML (1MG/ML) PREMIX INFUSION
1.0000 mg/h | INTRAVENOUS | Status: DC
  Administered 2021-02-16: 1 mg/h via INTRAVENOUS
  Administered 2021-02-17: 4 mg/h via INTRAVENOUS
  Filled 2021-02-16 (×2): qty 100

## 2021-02-16 MED ORDER — MORPHINE BOLUS VIA INFUSION
1.0000 mg | INTRAVENOUS | Status: DC | PRN
  Filled 2021-02-16: qty 1

## 2021-02-16 MED ORDER — LORAZEPAM 2 MG/ML IJ SOLN
1.0000 mg | INTRAMUSCULAR | Status: DC
  Administered 2021-02-16 – 2021-02-18 (×10): 1 mg via INTRAVENOUS
  Filled 2021-02-16 (×12): qty 1

## 2021-02-16 MED ORDER — GLYCOPYRROLATE 0.2 MG/ML IJ SOLN
0.4000 mg | INTRAMUSCULAR | Status: DC
  Administered 2021-02-16 – 2021-02-18 (×11): 0.4 mg via INTRAVENOUS
  Filled 2021-02-16 (×13): qty 2

## 2021-02-16 NOTE — Plan of Care (Signed)
  Problem: Coping: Goal: Ability to identify and develop effective coping behavior will improve Outcome: Progressing   Problem: Role Relationship: Goal: Family's ability to cope with current situation will improve Outcome: Progressing

## 2021-02-16 NOTE — Telephone Encounter (Signed)
Called patient regarding upcoming 02/23 and March appointment, left a voicemail.

## 2021-02-16 NOTE — Progress Notes (Incomplete)
NEW ADMISSION NOTE New Admission Note:   Arrival Method: BedMental Orientation:  Telemetry: no Assessment: Completed Skin: see assessment IV: right hand / thumb Pain: comfort care Tubes:  Subpubic urinary  Safety Measures: Safety Fall Prevention Plan has been given, discussed and signed Admission: Completed [facility] Orientation: Patient and family has been orientated to the room, unit and staff.  Family: daughter and wife present  Orders have been reviewed and implemented. Will continue to monitor the patient. Call light has been placed within reach and bed alarm has been activated.   Berneta Levins, RN

## 2021-02-16 NOTE — Consult Note (Signed)
Palliative Medicine Inpatient Consult Note  Reason for consult:  Goals of Care "Pain, stage 4 metastatic bladder and prostate cancer. Pain."  HPI:  Per intake H&P --> The patient is a 75 yr old man with a past medical history significant for CAD, DM II, prostate and bladder CA, morbid obesity, hypertension, hyperlipdemia, and male hypogonadism.He presented to [facility] on 02/12/2021 with complaints of increasing shortness of breath, confusion, and severe lower extremity and scrotal/penile edema.   Last evening patient was transitioned to comfort focused care per review with his wife, [name] who shares that he is "dying". Palliative care was asked to get involved to aid in symptom management.  Clinical Assessment/Goals of Care:  *Please note that this is a verbal dictation therefore any spelling or grammatical errors are due to the "[place] One" system interpretation.  I have reviewed medical records including EPIC notes, labs and imaging, received report from bedside RN, assessed the patient who was lying in bed intermittently moving his upper extremities and coughing.    I met with [name] to further discuss diagnosis prognosis, GOC, EOL wishes, disposition and options.   I introduced Palliative Medicine as specialized medical care for people living with serious illness. It focuses on providing relief from the symptoms and stress of a serious illness. The goal is to improve quality of life for both the patient and the family.  [name] shares with me Kingsley and she had been married for over 34 years. They share one daughter, [name] who is not Theme park manager daughter, but he is very much her father. He has two grandsons. 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 He is identified as a kind, loving, compassionate man. He is a man of faith and was raised [employer]. [name] shares that his relationship with god is a very personal one.   [name] and I reviewed all that Tramar has endured in the  past fifteen years. This all began with his prostate CA diagnosis which he was able to overcome. About two years ago he was diagnosed with bladder CA. He underwent 8 rounds of chemotherapy with good response until recurrence and metastasis were identified in July.   In regard to the quality of life that Jazz has been living. He has as of most recently been having a very difficult time. Over the past week his LE and scrotal swelling has become severe and has resulted in tremendous discomfort for Danthony. It had been discussed that he needed to be hospitalized for diuresis though per conversations with [name] she knows he is now at a state where his end is near. While they had discussed continuing to fight [name] no longer feels it is appropriate or what Severin wants at this point therefore she determined to focus on comfort last evening.   A detailed discussion was had today regarding advanced directives.    Concepts specific to code status, artifical feeding and hydration, continued IV antibiotics and rehospitalization was had. DNAR/DNI.   We talked about transition to comfort measures in house and what that would entail inclusive of medications to control pain, dyspnea, agitation, nausea, itching, and hiccups.   We discussed stopping all uneccessary measures such as blood draws, needle sticks, and frequent vital signs. Utilized reflective listening throughout our time together. This is clearly immensely difficult for [facility].   We discussed allowing Jsiah time here to see if his symptoms could be optimized prior to discussing transfer. The idea of transfer appears overwhelming for [place] at  this point.   Provided time listening to [name] discuss the difficulties of this whole situation. I encouraged her to practice some self care, as difficult as it is. I shared she also needs to take care of herself during this trial some time.   Discussed the importance of continued conversation with family and their   medical providers regarding overall plan of care and treatment options, ensuring decisions are within the context of the patients values and GOCs.  Decision Maker: Caydan Mctavish (Spouse) [phone]  SUMMARY OF RECOMMENDATIONS   DNAR/DNI  Comfort Focused Care  Medications per [facility] - I will start a morphine gtt and ativan ATC  Unrestricted Visitation  Will most likely be an in hospital death  Ongoing PMT support  Code Status/Advance Care Planning: DNAR/DNI   Palliative Prophylaxis:   Oral Care, Turn Q2H  Additional Recommendations (Limitations, Scope, Preferences):  Continue current scope of care - Comfort   Psycho-social/Spiritual:   Desire for further Chaplaincy support: No  Additional Recommendations: Education on end of life process   Prognosis: Limited Days  Discharge Planning: Discharge will be celetial  Vitals:   02/15/21 1200 02/16/21 0104  BP: (!) 142/58 110/60  Pulse: (!) 112 (!) 118  Resp: (!) 23 20  Temp: 98 F (36.7 C) 99.3 F (37.4 C)  SpO2: 94% 94%    Intake/Output Summary (Last 24 hours) at 02/16/2021 0734 Last data filed at 02/15/2021 6195 Gross per 24 hour  Intake --  Output 950 ml  Net -950 ml   Last Weight  Most recent update: 02/17/2021 11:23 PM   Weight  118.9 kg (262 lb 2 oz)           Gen:  Elderly M in NAD HEENT: Dry mucous membranes CV: Irregular rate and rhythm  PULM:  On 2 LPM [place] ABD: Distended EXT: Profound LE edema Neuro: Somnolent  PPS: 10%   This conversation/these recommendations were discussed with patient primary care team, Dr. Benny Lennert  Time In: 0730 Time Out: 0900 Total Time: 90 Greater than 50%  of this time was spent counseling and coordinating care related to the above assessment and plan.  [place] Team Team Cell Phone: [phone] Please utilize secure chat with additional questions, if there is no response within 30 minutes please call the above phone  number  Palliative Medicine Team providers are available by phone from 7am to 7pm daily and can be reached through the team cell phone.  Should this patient require assistance outside of these hours, please call the patient's attending physician.

## 2021-02-16 NOTE — Progress Notes (Signed)
PROGRESS NOTE  CHAZE HRUSKA YQM:[id number] DOB: 1946/11/07 DOA: 02/13/2021 PCP: [name], MD  Brief History   The patient is a 75 yr old man who presented to [place] on 02/13/2021 with complaints of increasing shortness of breath, confusion, and severe lower extremity and scrotal/penile edema. The patient's PCP had recently increased his lasix to 40 mg daily without improvement. CT of the abdomen and pelvis reveals no lymphatic or venous obstruction which could cause this severe edema. There is a 3 mm non-obstructing left renal calculus. CXR shows no acute findings. Echocardiogram demonstrates an EF of 60% and indeterminate diastolic parameters. No wall motion abnormalities.   Urinalysis is positive for UTI.  The patient carries a past medical history significant for CAD, DM II, prostate and bladder CA, morbid obesity, hypertension, hyperlipdemia, and male hypogonadism.  Per the patient's wife he has severe all over pain which is worsening. About an hour after seeing this patient I received a communication from nursing that the patient had become very agitated and combative. The patient's wife feels strongly that her husband is dying and his appearance was that of a man with pre-morbid agitation. The wife stated that she "just wanted him to be comfortable". I explained to the wife the meaning of comfort care. She stated that this was what she wanted. The patient was made comfort care and palliative care was consulted.   Consultants  . Palliative care  Procedures  . None  Antibiotics   Anti-infectives (From admission, onward)   Start     Dose/Rate Route Frequency Ordered Stop   02/21/2021 2315  cefTRIAXone (ROCEPHIN) 1 g in sodium chloride 0.9 % 100 mL IVPB  Status:  Discontinued        1 g 200 mL/hr over 30 Minutes Intravenous Every 24 hours 02/27/2021 2310 02/15/21 1447   02/09/2021 1745  levofloxacin (LEVAQUIN) tablet 750 mg        750 mg Oral  Once 02/02/2021 1744 02/02/2021 1816      Subjective  The patient is resting in bed. He is awake and comfortable.   Objective   Vitals:  Vitals:   02/15/21 1200 02/16/21 0104  BP: (!) 142/58 110/60  Pulse: (!) 112 (!) 118  Resp: (!) 23 20  Temp: 98 F (36.7 C) 99.3 F (37.4 C)  SpO2: 94% 94%   Exam:  Constitutional:  The patient is awake and alert. He is without acute distress Respiratory:  . No increased work of breathing. . No wheezes, rales, or rhonchi . No tactile fremitus . Lung sounds are distant. Cardiovascular:  . Regular rate and rhythm, rate is fast. . No murmurs, ectopy, or gallups. . No lateral PMI. No thrills. Abdomen:  . Abdomen is soft, non-tender, non-distended . Abdomen is morbidly obese. . No hernias, masses, or organomegaly can be appreciated due to body habitus. . Bowel sounds are distant.  Musculoskeletal:  . No cyanosis or clubbing . 4+ pitting edema bilaterally. . Left lower extremity is warm to touch and erythematous.  . Proximal right lower extremity is slightly erythematous, but not warm. Genitourinary: Penis and scrotum are hugely swollen with blistering, weeping and erythema. Skin:  . No rashes, lesions, ulcers . palpation of skin: no induration or nodules . Erythema, warmth, and blistering as described above. Neurologic:  . Pt is moving all extremities. Psychiatric:  . Pt is confused. Per his wife he has been having visual hallucinations.   I have personally reviewed the following:   Today's Data  .  Orthoptist  . Urine culture: No growth  Imaging  . CXR . CT abdomen and pelvis  Cardiology Data  . EKG . Echocardiogram  Scheduled Meds: . glycopyrrolate  0.4 mg Intravenous Q4H  . LORazepam  1 mg Intravenous Q4H  . sodium chloride flush  3 mL Intravenous Q12H   Continuous Infusions: . sodium chloride    . morphine 1 mg/hr (02/16/21 1139)    Principal Problem:   Anasarca Active Problems:   Coronary artery disease involving native coronary artery  of native heart without angina pectoris   Morbid obesity (HCC)   Hyperlipidemia associated with type 2 diabetes mellitus ([place])   Bladder cancer ([place])   Asthma with irreversible airway obstruction (HCC)   Dependent edema   Acute lower UTI   LOS: 2 days   A & P  Anasarca: Etiology unclear. Echo demonstrated EF of 60%. Creatinine is elevated at 2.20.  Albumin is low at 2.3, but could be worse. No apparent lymphatic or venous obstruction. Pt is now comfort care. Metastatic bladder and prostate cancer: Patient will continue follow-up with oncology. Pt is now comfort care.  UTI: Cultures obtained both urine and blood. Pt is now comfort care.  Asthma: No exacerbation. Pt is now comfort care.  Coronary artery disease: Stable. No wall motion abnormalities on echocardiogram.  Pt is now comfort care.  Morbid obesity: Stable. Complicates all cares. Pt is now comfort care.  Hyperlipidemia: Pt is now comfort care.  Diabetes: Under fair control with HbA1c of 6.1. The patient is on Levemir 16 units daily and metformin at home. Here his glucoses have been followed with FSBS and SSI. Pt is now comfort care.  I have seen and examined this patient myself. I have spent 32 minutes in his evaluation and care.  DVT prophylaxis: Eliquis Code Status: Full code Family Communication: Discussed patient at length with his spouse and daughter who are at bedside. Disposition Plan: Residential Hospice  Viridiana Spaid, DO [facility] Direct contact: see [email or web address]  7PM-7AM contact night coverage as above 02/16/2021, 3:14 PM  LOS: 1 day

## 2021-02-17 DIAGNOSIS — Z7189 Other specified counseling: Secondary | ICD-10-CM | POA: Diagnosis not present

## 2021-02-17 DIAGNOSIS — Z66 Do not resuscitate: Secondary | ICD-10-CM | POA: Diagnosis not present

## 2021-02-17 DIAGNOSIS — Z515 Encounter for palliative care: Secondary | ICD-10-CM | POA: Diagnosis not present

## 2021-02-17 DIAGNOSIS — R601 Generalized edema: Secondary | ICD-10-CM | POA: Diagnosis not present

## 2021-02-17 LAB — URINE CULTURE: Culture: >=100000 — AB

## 2021-02-17 MED ORDER — KETOROLAC TROMETHAMINE 15 MG/ML IJ SOLN
15.0000 mg | Freq: Three times a day (TID) | INTRAMUSCULAR | Status: DC
  Administered 2021-02-17 – 2021-02-18 (×3): 15 mg via INTRAVENOUS
  Filled 2021-02-17 (×3): qty 1

## 2021-02-17 NOTE — Progress Notes (Signed)
Palliative Medicine Inpatient Follow Up Note  Reason for consult:  Goals of Care "Pain, stage 4 metastatic bladder and prostate cancer. Pain."  HPI:  Per intake H&P --> The patient is a 75 yr old man with a past medical history significant for CAD, DM II, prostate and bladder CA, morbid obesity, hypertension, hyperlipdemia, and male hypogonadism.He presented to [facility] on 02/26/2021 with complaints of increasing shortness of breath, confusion, and severe lower extremity and scrotal/penile edema.   Last evening patient was transitioned to comfort focused care per review with his wife, Jimmy [name] who shares that he is "dying". Palliative care was asked to get involved to aid in symptom management.  Today's Discussion (02/17/2021):  *Please note that this is a verbal dictation therefore any spelling or grammatical errors are due to the "[place] One" system interpretation.  Chart reviewed.   I met with patients wife, Jimmy [name] and his daughter, Jimmy [name] at bedside. Jimmy [name] and I reviewed my note from the prior day. I shared with her some signs to indicate when Jimmy [name] was getting closer to the end of life. She brought up a question about his O2 levels dropping which I shared was normal throughout this process - we discussed how controversial the use of O2 at the end of life can be. I was able to while at bedside turn this off. We reviewed the changes in breathing patterns that can be expected as well as the rattling sound that presents at the end of life. Reviewed extremity mottling. Offered support through present.    I spoke to [employer], [name] and I discussed weaning him off of O2 completely. I shared with her that I had already turned off O2.  Per note review patient had a fever and was unable to tolerate tylenol orally or in suppository form. Ordered Toradol to better support this.   Ongoing Palliative support.  At this point I believe Jimmy [name] should stay in hospital for ongoing symptom  management.  Objective Assessment: Vital Signs Vitals:   02/16/21 0104 02/17/21 0948  BP: 110/60 108/63  Pulse: (!) 118 (!) 124  Resp: 20 14  Temp: 99.3 F (37.4 C) (!) 100.9 F (38.3 C)  SpO2: 94%     Intake/Output Summary (Last 24 hours) at 02/17/2021 1143 Last data filed at 02/17/2021 0813 Gross per 24 hour  Intake 35.65 ml  Output 1750 ml  Net -1714.35 ml   Last Weight  Most recent update: 02/05/2021 11:23 PM   Weight  118.9 kg (262 lb 2 oz)           Gen:  Elderly M in NAD HEENT: Dry mucous membranes CV: Irregular rate and rhythm  PULM:  On 2 LPM [place] ABD: Distended EXT: Profound LE edema Neuro: Somnolent  SUMMARY OF RECOMMENDATIONS DNAR/DNI  Comfort Focused Care  Medications per [facility] - On a morphine gtt and ativan ATC  Weaned off of O2  Torodol Added for fevers (cannot tolerate tylenol due to route of administration)  Unrestricted Visitation  Will most likely be an in hospital death  Ongoing PMT support  Time Spent: 35 Greater than 50% of the time was spent in counseling and coordination of care ______________________________________________________________________________________ [place] Team Team Cell Phone: [phone] Please utilize secure chat with additional questions, if there is no response within 30 minutes please call the above phone number  Palliative Medicine Team providers are available by phone from 7am to 7pm daily and can be reached through the team  cell phone.  Should this patient require assistance outside of these hours, please call the patient's attending physician.

## 2021-02-17 NOTE — Progress Notes (Addendum)
No tylenol given for fever of 101 per family request as patient is unable to tolerate swallowing PO, and unable to tolerate per rectum as patient starts swinging arms due to increasing confusion and lack of environment awareness. MD has been notified, no new orders have been placed at this time.

## 2021-02-17 NOTE — Progress Notes (Signed)
PROGRESS NOTE  Jimmy [name] JIR:[id number] DOB: [birth year] DOA: 02-28-21 PCP: [name], MD  Brief History   The patient is a 75 yr old man who presented to [facility] on Feb 28, 2021 with complaints of increasing shortness of breath, confusion, and severe lower extremity and scrotal/penile edema. The patient's PCP had recently increased his lasix to 40 mg daily without improvement. CT of the abdomen and pelvis reveals no lymphatic or venous obstruction which could cause this severe edema. There is a 3 mm non-obstructing left renal calculus. CXR shows no acute findings. Echocardiogram demonstrates an EF of 60% and indeterminate diastolic parameters. No wall motion abnormalities.   Urinalysis is positive for UTI.  The patient carries a past medical history significant for CAD, DM II, prostate and bladder CA, morbid obesity, hypertension, hyperlipdemia, and male hypogonadism.  Per the patient's wife he has severe all over pain which is worsening. About an hour after seeing this patient I received a communication from nursing that the patient had become very agitated and combative. The patient's wife feels strongly that her husband is dying and his appearance was that of a man with pre-morbid agitation. The wife stated that she "just wanted him to be comfortable". I explained to the wife the meaning of comfort care. She stated that this was what she wanted. The patient was made comfort care and palliative care was consulted.   Consultants  . Palliative care  Procedures  . None  Antibiotics   Anti-infectives (From admission, onward)   Start     Dose/Rate Route Frequency Ordered Stop   Feb 28, 2021 2315  cefTRIAXone (ROCEPHIN) 1 g in sodium chloride 0.9 % 100 mL IVPB  Status:  Discontinued        1 g 200 mL/hr over 30 Minutes Intravenous Every 24 hours February 28, 2021 2310 02/15/21 1447   February 28, 2021 1745  levofloxacin (LEVAQUIN) tablet 750 mg        750 mg Oral  Once 28-Feb-2021 1744 02/28/21 1816      Subjective  The patient is resting in bed. He is resting comfortably. While he was awake and able to interact on 02/16/2021, this morning he is not.  Objective   Vitals:  Vitals:   02/16/21 0104 02/17/21 0948  BP: 110/60 108/63  Pulse: (!) 118 (!) 124  Resp: 20 14  Temp: 99.3 F (37.4 C) (!) 100.9 F (38.3 C)  SpO2: 94%    Exam:  Constitutional:  The patient is awake and alert. He is without acute distress. Respiratory:  . No increased work of breathing. . No wheezes, rales, or rhonchi . No tactile fremitus . Lung sounds are distant. Cardiovascular:  . Regular rate and rhythm, rate is fast. . No murmurs, ectopy, or gallups. . No lateral PMI. No thrills. Abdomen:  . Abdomen is soft, non-tender, non-distended . Abdomen is morbidly obese. . No hernias, masses, or organomegaly can be appreciated due to body habitus. . Bowel sounds are distant.  Musculoskeletal:  . No cyanosis or clubbing . 4+ pitting edema bilaterally. . Left lower extremity is warm to touch and erythematous.  . Proximal right lower extremity is slightly erythematous, but not warm. Genitourinary: Penis and scrotum are hugely swollen with blistering, weeping and erythema. Skin:  . No rashes, lesions, ulcers . palpation of skin: no induration or nodules . Erythema, warmth, and blistering as described above. Neurologic:  . Pt is moving all extremities. Psychiatric:  . Pt is confused. Per his wife he has been having visual hallucinations.  I have personally reviewed the following:   Today's Data  . Orthoptist  . Urine culture: No growth  Imaging  . CXR . CT abdomen and pelvis  Cardiology Data  . EKG . Echocardiogram  Scheduled Meds: . glycopyrrolate  0.4 mg Intravenous Q4H  . ketorolac  15 mg Intravenous Q8H  . LORazepam  1 mg Intravenous Q4H  . sodium chloride flush  3 mL Intravenous Q12H   Continuous Infusions: . sodium chloride    . morphine 2 mg/hr (02/16/21 1514)     Principal Problem:   Anasarca Active Problems:   Coronary artery disease involving native coronary artery of native heart without angina pectoris   Morbid obesity (HCC)   Hyperlipidemia associated with type 2 diabetes mellitus ([place])   Bladder cancer ([place])   Asthma with irreversible airway obstruction (HCC)   Dependent edema   Acute lower UTI   LOS: 3 days   A & P  Anasarca: Etiology unclear. Echo demonstrated EF of 60%. Creatinine is elevated at 2.20.  Albumin is low at 2.3, but could be worse. No apparent lymphatic or venous obstruction. Pt is now comfort care. Metastatic bladder and prostate cancer: Patient will continue follow-up with oncology. Pt is now comfort care.  UTI: Cultures obtained both urine and blood. Pt is now comfort care.  Asthma: No exacerbation. Pt is now comfort care.  Coronary artery disease: Stable. No wall motion abnormalities on echocardiogram.  Pt is now comfort care.  Morbid obesity: Stable. Complicates all cares. Pt is now comfort care.  Hyperlipidemia: Pt is now comfort care.  Diabetes: Under fair control with HbA1c of 6.1. The patient is on Levemir 16 units daily and metformin at home. Here his glucoses have been followed with FSBS and SSI. Pt is now comfort care.  I have seen and examined this patient myself. I have spent 34 minutes in his evaluation and care.  DVT prophylaxis: Eliquis Code Status: Full code Family Communication: Discussed patient at length with his spouse and daughter who are at bedside. Disposition Plan: Residential Hospice  Adalea Handler, DO [facility] Direct contact: see [email or web address]  7PM-7AM contact night coverage as above 02/17/2021, 2:46 PM  LOS: 1 day

## 2021-02-17 NOTE — TOC Progression Note (Signed)
Transition of Care [facility]) - Progression Note    Patient Details  Name: ROCHELL MABIE MRN: [id number] Date of Birth: 08/02/1946  [facility] CM/SW Contact  Sharlet Salina Mila Homer, LCSW Phone Number: 02/17/2021, 3:35 PM  Clinical Narrative:  Consult received consult for residential hospice. Reviewed Palliative note and hospital death anticipated. CSW will continue to follow.        Expected Discharge Plan and [facility] death anticipated.                                             Social Determinants of Health (SDOH) Interventions    Readmission Risk Interventions Readmission Risk Prevention Plan 02/15/2020  Transportation Screening Complete  PCP or Specialist Appt within 3-5 Days Complete  HRI or [place] Complete  Social Work Consult for [place] Planning/Counseling Complete  Palliative Care Screening Not Complete  Palliative Care Screening Not Complete Comments Palliative consult not requested by physician  Medication Review (RN Care Manager) Referral to Pharmacy  Some recent data might be hidden

## 2021-02-18 DIAGNOSIS — R601 Generalized edema: Secondary | ICD-10-CM | POA: Diagnosis not present

## 2021-02-21 ENCOUNTER — Telehealth: Payer: Self-pay | Admitting: Family Medicine

## 2021-02-21 NOTE — Telephone Encounter (Signed)
Sympathy card sent 

## 2021-02-22 ENCOUNTER — Inpatient Hospital Stay: Payer: Medicare PPO

## 2021-02-22 ENCOUNTER — Inpatient Hospital Stay: Payer: Medicare PPO | Admitting: Oncology

## 2021-02-22 ENCOUNTER — Other Ambulatory Visit: Payer: Medicare PPO

## 2021-02-28 NOTE — Progress Notes (Signed)
  Wasted Ativan 2 mg IV in stericycle with Alden Server, RN

## 2021-02-28 NOTE — Plan of Care (Signed)
  Problem: Health Behavior/Discharge Planning: Goal: Ability to manage health-related needs will improve Outcome: Not Applicable   Problem: Clinical Measurements: Goal: Ability to maintain clinical measurements within normal limits will improve Outcome: Not Applicable Goal: Will remain free from infection Outcome: Not Applicable Goal: Diagnostic test results will improve Outcome: Not Applicable   Problem: Education: Goal: Knowledge of the prescribed therapeutic regimen will improve Outcome: Not Applicable   Problem: Coping: Goal: Ability to identify and develop effective coping behavior will improve Outcome: Not Applicable   Problem: Clinical Measurements: Goal: Quality of life will improve Outcome: Not Applicable   Problem: Respiratory: Goal: Verbalizations of increased ease of respirations will increase Outcome: Not Applicable   Problem: Role Relationship: Goal: Family's ability to cope with current situation will improve Outcome: Not Applicable Goal: Ability to verbalize concerns, feelings, and thoughts to partner or family member will improve Outcome: Not Applicable   Problem: Pain Management: Goal: Satisfaction with pain management regimen will improve Outcome: Not Applicable

## 2021-02-28 NOTE — Progress Notes (Signed)
Wasted Ativan 2 mg in stericycle with Iverson Alamin, RN.

## 2021-02-28 NOTE — Progress Notes (Signed)
Patient's time of death 08:40 am, wife present at bedside and aware of the situation.  Notified Dr. Benny Lennert via chart.

## 2021-02-28 NOTE — Discharge Summary (Signed)
Physician Death Summary  Jimmy [name]:[id number] DOB: 04-25-1946 DOA: February 27, 2021  PCP: [name], MD  Admit date: 2021/02/27 Date and Time of Death: 8:40 on  2021/03/03  Discharge Diagnoses: Principal diagnosis is #1 1. Anasarca 2. Metastatic bladder and prostate cancer 3. UTI 4. Asthma 5. Coronary artery disease 6. Morbid obesity 7. Hyperlilpidemia 8. DM II 9. AKI on CKD IIIb   Filed Weights   Feb 27, 2021 2323 03-03-2021 0840  Weight: 118.9 kg 118.9 kg    History of present illness:  Jimmy [name] is a 75 y.o. male with medical history significant of coronary artery disease, diabetes, prostate cancer, morbid obesity, hypertension, bladder cancer, hyperlipidemia, male hypogonadism who has chronic lower extremity edema that has gotten worse lately.  He has been on oral Lasix at home and has been taking it at 20 mg but not helping.  His PCP increase it to 40 mg recently but still has worsening edema.  He is started having shortness of breath.  Some dysuria but no fever or chills.  Patient came to the ER where he was noted to have anasarca.  He does have stage IV bladder cancer and evidence of UTI.  There was initial thought of possible mechanical obstruction leading to lower extremity edema related to his bladder cancer.  No obvious evidence of that at the moment.  He is having exertional dyspnea.  He has progressively decreased this active life.  Patient being admitted with anasarca most likely cardiac in nature.  No recent echocardiogram..  ED Course: Temperature 98.2, blood pressure 124/49 pulse 99 respirate 29 oxygen sat 95% room air.  White count is 7.9 hemoglobin third 11.3 and platelets 233.  COVID-19 screen is negative.  Urinalysis showed large leukocyte.  Positive nitrites.  Many bacteria and WBC 21-50.  Urine culture obtained blood cultures obtained and patient being admitted for further evaluation and treatment.  Chest x-ray shows no acute findings.  CT abdomen pelvis  shows stable postsurgical changes from cystectomy and diabetic urostomy right lower quadrant.  Stable bilateral pulmonary nodules consistent with static disease and cirrhosis with stable hepatosplenomegaly.  There is a 3 mm nonobstructing left renal calculus.  Hospital Course:  The patient is a 75 yr old man who presented to [facility] on 02-27-21 with complaints of increasing shortness of breath, confusion, and severe lower extremity and scrotal/penile edema. The patient's PCP had recently increased his lasix to 40 mg daily without improvement. CT of the abdomen and pelvis reveals no lymphatic or venous obstruction which could cause this severe edema. There is a 3 mm non-obstructing left renal calculus. CXR shows no acute findings. Echocardiogram demonstrates an EF of 60% and indeterminate diastolic parameters. No wall motion abnormalities.   Urinalysis is positive for UTI.  The patient carries a past medical history significant for CAD, DM II, prostate and bladder CA, morbid obesity, hypertension, hyperlipdemia, and male hypogonadism.  Per the patient's wife he has severe all over pain which is worsening. About an hour after seeing this patient I received a communication from nursing that the patient had become very agitated and combative. The patient's wife feels strongly that her husband is dying and his appearance was that of a man with pre-morbid agitation. The wife stated that she "just wanted him to be comfortable". I explained to the wife the meaning of comfort care. She stated that this was what she wanted. The patient was made comfort care and palliative care was consulted.   The patient was found without  spontaneous respirations or cardiac activity at 8:40 on the am on March 10, 2021.  Today's assessment: S: Please see physical exam for the progress note dated 02/17/2021 for the last physical exam performed prior to the patient's death. O: Vitals:  Vitals:   Mar 08, 2021 0104 02/17/21 0948  BP:  110/60 108/63  Pulse: (!) 118 (!) 124  Resp: 20 14  Temp: 99.3 F (37.4 C) (!) 100.9 F (38.3 C)  SpO2: 94%     Discharge Instructions    Allergies  Allergen Reactions  . Ace Inhibitors Cough  . Cephalexin Hives, Diarrhea and Nausea Only    Tolerates Ancef, Rocephin, Cefoxitin  . Crestor [Rosuvastatin Calcium]   . Lipitor [Atorvastatin Calcium] Other (See Comments)    MUSCLE PAINS   . Plavix [Clopidogrel Bisulfate] Hives  . Vancomycin Nausea And Vomiting    Po route caused n/v.     The results of significant diagnostics from this hospitalization (including imaging, microbiology, ancillary and laboratory) are listed below for reference.    Significant Diagnostic Studies: CT ABDOMEN PELVIS WO CONTRAST  Result Date: 02/17/2021 CLINICAL DATA:  Lower extremity edema, history of bladder cancer EXAM: CT ABDOMEN AND PELVIS WITHOUT CONTRAST TECHNIQUE: Multidetector CT imaging of the abdomen and pelvis was performed following the standard protocol without IV contrast. COMPARISON:  01/18/2021 FINDINGS: Lower chest: Bibasilar pulmonary nodules are identified. Right lower lobe pleural base nodule measures 19 x 11 mm image 5/3, previously measuring 17 x 11 mm. Remaining visualized nodules are grossly stable. Stable scarring and subpleural fibrosis. No acute airspace disease. Unenhanced CT was performed per clinician order. Lack of IV contrast limits sensitivity and specificity, especially for evaluation of abdominal/pelvic solid viscera. Hepatobiliary: Stable hepatomegaly with nodularity of the hepatic capsule consistent with cirrhosis. No gross abnormalities of the liver parenchyma on this unenhanced exam. No intrahepatic duct dilation. The gallbladder is unremarkable. Pancreas: Unremarkable. No pancreatic ductal dilatation or surrounding inflammatory changes. Spleen: Stable splenomegaly. No gross abnormalities on this unenhanced exam. Adrenals/Urinary Tract: 3 mm nonobstructing left renal  calculus. No obstructive uropathy. Postsurgical changes from cystectomy and diverting urostomy right lower quadrant. The adrenals are unremarkable. Stomach/Bowel: No bowel obstruction or ileus. No bowel wall thickening or inflammatory change. Vascular/Lymphatic: Stable atherosclerosis of the aorta and its branches. No pathologic adenopathy. Reproductive: Fiduciary markers are seen within the prostate, which is not enlarged. Other: There is no free fluid or free gas. Stable fat stranding in the lower pelvis likely related to prior therapy. Musculoskeletal: There are no acute or destructive bony lesions. Prominent subcutaneous edema within the bilateral flanks and lower extremities. Reconstructed images demonstrate no additional findings. IMPRESSION: 1. Stable postsurgical changes from cystectomy and diverting urostomy right lower quadrant. 2. Stable bilateral pulmonary nodules consistent with metastatic disease. 3. Cirrhosis, with stable hepatosplenomegaly. 4. A 3 mm nonobstructing left renal calculus. 5. Prominent subcutaneous edema within the bilateral flanks and lower extremities. 6. Aortic Atherosclerosis (ICD10-I70.0). Electronically Signed   By: Randa Ngo M.D.   On: 02/22/2021 17:06   DG Chest Port 1 View  Result Date: 02/12/2021 CLINICAL DATA:  Shortness of breath EXAM: PORTABLE CHEST 1 VIEW COMPARISON:  07/31/2020 FINDINGS: Diffuse interstitial prominence throughout the lungs, likely chronic interstitial lung disease/fibrosis. Heart is borderline enlarged. No acute confluent opacities or effusions. No acute bony abnormality. IMPRESSION: Severe chronic lung disease.  No definite acute process. Electronically Signed   By: Rolm Baptise M.D.   On: 02/22/2021 14:52   ECHOCARDIOGRAM COMPLETE  Result Date: 02/15/2021    ECHOCARDIOGRAM REPORT  Patient Name:   ANMOL FLECK Date of Exam: 02/15/2021 Medical Rec #:  [id number]        Height:       67.0 in Accession #:    [id number]       Weight:        262.1 lb Date of Birth:  1946-04-25         BSA:          2.269 m Patient Age:    24 years         BP:           150/53 mmHg Patient Gender: M                HR:           113 bpm. Exam Location:  Inpatient Procedure: 2D Echo, Cardiac Doppler and Color Doppler Indications:    I50.23 Acute on chronic systolic (congestive) heart failure  History:        Patient has prior history of Echocardiogram examinations, most                 recent 12/30/2019. CAD; Risk Factors:Diabetes and Dyslipidemia.                 Cancer.  Sonographer:    Jonelle Sidle Dance Referring Phys: [place]  1. Left ventricular ejection fraction, by estimation, is 65 to 70%. The left ventricle has normal function. The left ventricle has no regional wall motion abnormalities. Left ventricular diastolic parameters are indeterminate.  2. Right ventricular systolic function is normal. The right ventricular size is normal. Tricuspid regurgitation signal is inadequate for assessing PA pressure.  3. The mitral valve is normal in structure. No evidence of mitral valve regurgitation.  4. The aortic valve was not well visualized. Aortic valve regurgitation is not visualized. Mild to moderate aortic valve sclerosis/calcification is present, without any evidence of aortic stenosis. FINDINGS  Left Ventricle: Left ventricular ejection fraction, by estimation, is 65 to 70%. The left ventricle has normal function. The left ventricle has no regional wall motion abnormalities. The left ventricular internal cavity size was normal in size. There is  no left ventricular hypertrophy. Left ventricular diastolic parameters are indeterminate. Right Ventricle: The right ventricular size is normal. No increase in right ventricular wall thickness. Right ventricular systolic function is normal. Tricuspid regurgitation signal is inadequate for assessing PA pressure. Left Atrium: Left atrial size was normal in size. Right Atrium: Right atrial size was normal  in size. Pericardium: There is no evidence of pericardial effusion. Mitral Valve: The mitral valve is normal in structure. No evidence of mitral valve regurgitation. Tricuspid Valve: The tricuspid valve is normal in structure. Tricuspid valve regurgitation is trivial. Aortic Valve: The aortic valve was not well visualized. Aortic valve regurgitation is not visualized. Mild to moderate aortic valve sclerosis/calcification is present, without any evidence of aortic stenosis. Pulmonic Valve: The pulmonic valve was not well visualized. Pulmonic valve regurgitation is not visualized. Aorta: The aortic root and ascending aorta are structurally normal, with no evidence of dilitation. IAS/Shunts: The interatrial septum was not well visualized.  LEFT VENTRICLE PLAX 2D LVIDd:         4.40 cm LVIDs:         3.30 cm LV PW:         1.00 cm LV IVS:        1.00 cm LVOT diam:     2.20 cm LV SV:  97 LV SV Index:   43 LVOT Area:     3.80 cm  RIGHT VENTRICLE             IVC RV Basal diam:  1.80 cm     IVC diam: 1.90 cm RV S prime:     13.30 cm/s TAPSE (M-mode): 1.5 cm LEFT ATRIUM           Index       RIGHT ATRIUM           Index LA diam:      4.70 cm 2.07 cm/m  RA Area:     10.30 cm LA Vol (A2C): 40.5 ml 17.85 ml/m RA Volume:   16.80 ml  7.41 ml/m LA Vol (A4C): 58.1 ml 25.61 ml/m  AORTIC VALVE LVOT Vmax:   125.00 cm/s LVOT Vmean:  86.200 cm/s LVOT VTI:    0.254 m  AORTA Ao Root diam: 3.40 cm Ao Asc diam:  3.20 cm MV A velocity: 107.50 cm/s                             SHUNTS                             Systemic VTI:  0.25 m                             Systemic Diam: 2.20 cm Oswaldo Milian MD Electronically signed by Oswaldo Milian MD Signature Date/Time: 02/15/2021/1:58:11 PM    Final     Microbiology: Recent Results (from the past 240 hour(s))  Resp Panel by RT-PCR (Flu A&B, Covid) Nasopharyngeal Swab     Status: None   Collection Time: 02/09/2021  3:52 PM   Specimen: Nasopharyngeal Swab;  Nasopharyngeal(NP) swabs in vial transport medium  Result Value Ref Range Status   SARS Coronavirus 2 by RT PCR NEGATIVE NEGATIVE Final    Comment: (NOTE) SARS-CoV-2 target nucleic acids are NOT DETECTED.  The SARS-CoV-2 RNA is generally detectable in upper respiratory specimens during the acute phase of infection. The lowest concentration of SARS-CoV-2 viral copies this assay can detect is 138 copies/mL. A negative result does not preclude SARS-Cov-2 infection and should not be used as the sole basis for treatment or other patient management decisions. A negative result may occur with  improper specimen collection/handling, submission of specimen other than nasopharyngeal swab, presence of viral mutation(s) within the areas targeted by this assay, and inadequate number of viral copies(<138 copies/mL). A negative result must be combined with clinical observations, patient history, and epidemiological information. The expected result is Negative.  Fact Sheet for Patients:  EntrepreneurPulse.com.au  Fact Sheet for Healthcare Providers:  IncredibleEmployment.be  This test is no t yet approved or cleared by the [place] FDA and  has been authorized for detection and/or diagnosis of SARS-CoV-2 by FDA under an Emergency Use Authorization (EUA). This EUA will remain  in effect (meaning this test can be used) for the duration of the COVID-19 declaration under Section 564(b)(1) of the Act, 21 U.S.C.section 360bbb-3(b)(1), unless the authorization is terminated  or revoked sooner.       Influenza A by PCR NEGATIVE NEGATIVE Final   Influenza B by PCR NEGATIVE NEGATIVE Final    Comment: (NOTE) The Xpert Xpress SARS-CoV-2/FLU/RSV plus assay is intended as an aid in the diagnosis of influenza from  Nasopharyngeal swab specimens and should not be used as a sole basis for treatment. Nasal washings and aspirates are unacceptable for Xpert Xpress  SARS-CoV-2/FLU/RSV testing.  Fact Sheet for Patients: EntrepreneurPulse.com.au  Fact Sheet for Healthcare Providers: IncredibleEmployment.be  This test is not yet approved or cleared by the [place] FDA and has been authorized for detection and/or diagnosis of SARS-CoV-2 by FDA under an Emergency Use Authorization (EUA). This EUA will remain in effect (meaning this test can be used) for the duration of the COVID-19 declaration under Section 564(b)(1) of the Act, 21 U.S.C. section 360bbb-3(b)(1), unless the authorization is terminated or revoked.  Performed at [facility], [place] [address], [place], [place] [address]   Urine Culture     Status: Abnormal   Collection Time: 02/19/2021  5:45 PM   Specimen: Urine, Random  Result Value Ref Range Status   Specimen Description URINE, RANDOM  Final   Special Requests   Final    NONE Performed at [facility], [place] [address] [place], [place] [address]    Culture >=100,000 COLONIES/mL PROTEUS MIRABILIS (A)  Final   Report Status 02/17/2021 FINAL  Final   Organism ID, Bacteria PROTEUS MIRABILIS (A)  Final      Susceptibility   Proteus mirabilis - MIC*    AMPICILLIN <=2 SENSITIVE Sensitive     CEFAZOLIN 8 SENSITIVE Sensitive     CEFEPIME <=0.12 SENSITIVE Sensitive     CEFTRIAXONE <=0.25 SENSITIVE Sensitive     CIPROFLOXACIN <=0.25 SENSITIVE Sensitive     GENTAMICIN <=1 SENSITIVE Sensitive     IMIPENEM 2 SENSITIVE Sensitive     NITROFURANTOIN RESISTANT Resistant     TRIMETH/SULFA <=20 SENSITIVE Sensitive     AMPICILLIN/SULBACTAM <=2 SENSITIVE Sensitive     PIP/TAZO <=4 SENSITIVE Sensitive     * >=100,000 COLONIES/mL PROTEUS MIRABILIS     Labs: Basic Metabolic Panel: Recent Labs  Lab 02/07/2021 1424 02/15/21 0438  NA 138 138  K 4.8 4.6  CL 105 104  CO2 22 19*  GLUCOSE 105* 109*  BUN 31* 34*  CREATININE 2.03* 2.20*  CALCIUM 9.0 8.9   Liver Function Tests: Recent Labs   Lab 02/13/2021 1424  AST 65*  ALT 20  ALKPHOS 89  BILITOT 0.9  PROT 5.7*  ALBUMIN 2.3*   No results for input(s): LIPASE, AMYLASE in the last 168 hours. No results for input(s): AMMONIA in the last 168 hours. CBC: Recent Labs  Lab 01/31/2021 1424  WBC 7.9  NEUTROABS 4.5  HGB 11.3*  HCT 34.8*  MCV 95.1  PLT 233   Cardiac Enzymes: No results for input(s): CKTOTAL, CKMB, CKMBINDEX, TROPONINI in the last 168 hours. BNP: BNP (last 3 results) Recent Labs    07/30/20 0633 02/12/2021 1424  BNP 188.0* 58.1    ProBNP (last 3 results) No results for input(s): PROBNP in the last 8760 hours.  CBG: Recent Labs  Lab 01/31/2021 2343 02/15/21 0812 02/15/21 1138  GLUCAP 98 103* 101*    Principal Problem:   Anasarca Active Problems:   Coronary artery disease involving native coronary artery of native heart without angina pectoris   Morbid obesity (HCC)   Hyperlipidemia associated with type 2 diabetes mellitus ([place])   Bladder cancer ([place])   Asthma with irreversible airway obstruction ([place])   Dependent edema   Acute lower UTI   Time coordinating discharge: More than 38 minutes.  Signed:        Cambell Rickenbach, DO [facility]  02/27/21,  4:10 PM  

## 2021-02-28 DEATH — deceased

## 2021-03-15 ENCOUNTER — Ambulatory Visit: Payer: Medicare PPO | Admitting: Oncology

## 2021-03-15 ENCOUNTER — Other Ambulatory Visit: Payer: Medicare PPO

## 2021-03-15 ENCOUNTER — Ambulatory Visit: Payer: Medicare PPO

## 2021-05-09 ENCOUNTER — Ambulatory Visit: Payer: Medicare PPO | Admitting: Family Medicine

## 2023-11-21 NOTE — Telephone Encounter (Signed)
Telephone call
